# Patient Record
Sex: Male | Born: 1945
Health system: Southern US, Community
[De-identification: ages and names within clinical notes are randomized; demographics above are authoritative.]

## PROBLEM LIST (undated history)

## (undated) DIAGNOSIS — E291 Testicular hypofunction: Secondary | ICD-10-CM

## (undated) DIAGNOSIS — E119 Type 2 diabetes mellitus without complications: Secondary | ICD-10-CM

## (undated) DIAGNOSIS — C61 Malignant neoplasm of prostate: Secondary | ICD-10-CM

## (undated) DIAGNOSIS — I2510 Atherosclerotic heart disease of native coronary artery without angina pectoris: Secondary | ICD-10-CM

## (undated) DIAGNOSIS — N182 Chronic kidney disease, stage 2 (mild): Secondary | ICD-10-CM

## (undated) DIAGNOSIS — K219 Gastro-esophageal reflux disease without esophagitis: Secondary | ICD-10-CM

## (undated) DIAGNOSIS — I440 Atrioventricular block, first degree: Secondary | ICD-10-CM

## (undated) DIAGNOSIS — K5790 Diverticulosis of intestine, part unspecified, without perforation or abscess without bleeding: Secondary | ICD-10-CM

## (undated) DIAGNOSIS — N183 Chronic kidney disease, stage 3 unspecified: Secondary | ICD-10-CM

## (undated) DIAGNOSIS — N189 Chronic kidney disease, unspecified: Secondary | ICD-10-CM

## (undated) DIAGNOSIS — N529 Male erectile dysfunction, unspecified: Secondary | ICD-10-CM

## (undated) DIAGNOSIS — N281 Cyst of kidney, acquired: Secondary | ICD-10-CM

## (undated) DIAGNOSIS — I10 Essential (primary) hypertension: Secondary | ICD-10-CM

## (undated) DIAGNOSIS — E611 Iron deficiency: Secondary | ICD-10-CM

## (undated) DIAGNOSIS — Z9289 Personal history of other medical treatment: Secondary | ICD-10-CM

## (undated) DIAGNOSIS — D4101 Neoplasm of uncertain behavior of right kidney: Secondary | ICD-10-CM

## (undated) DIAGNOSIS — E785 Hyperlipidemia, unspecified: Secondary | ICD-10-CM

## (undated) DIAGNOSIS — J301 Allergic rhinitis due to pollen: Secondary | ICD-10-CM

## (undated) DIAGNOSIS — Z972 Presence of dental prosthetic device (complete) (partial): Secondary | ICD-10-CM

## (undated) DIAGNOSIS — N401 Enlarged prostate with lower urinary tract symptoms: Secondary | ICD-10-CM

## (undated) HISTORY — DX: Essential (primary) hypertension: I10

## (undated) HISTORY — PX: DENTAL SURGERY: SHX609

## (undated) HISTORY — DX: Chronic kidney disease, stage 2 (mild): N18.2

## (undated) HISTORY — DX: Chronic kidney disease, unspecified: N18.9

## (undated) HISTORY — DX: Hyperlipidemia, unspecified: E78.5

## (undated) HISTORY — DX: Gastro-esophageal reflux disease without esophagitis: K21.9

## (undated) HISTORY — DX: Atherosclerotic heart disease of native coronary artery without angina pectoris: I25.10

## (undated) HISTORY — DX: Male erectile dysfunction, unspecified: N52.9

## (undated) HISTORY — DX: Testicular hypofunction: E29.1

## (undated) HISTORY — PX: CIRCUMCISION: SUR203

## (undated) HISTORY — DX: Diverticulosis of intestine, part unspecified, without perforation or abscess without bleeding: K57.90

## (undated) HISTORY — PX: KNEE CARTILAGE SURGERY: SHX688

## (undated) HISTORY — DX: Allergic rhinitis due to pollen: J30.1

## (undated) HISTORY — PX: OTHER SURGICAL HISTORY: SHX169

## (undated) HISTORY — PX: KNEE ARTHROSCOPY: SHX127

---

## 1998-01-09 ENCOUNTER — Encounter: Admission: RE | Admit: 1998-01-09 | Discharge: 1998-04-09 | Payer: Self-pay | Admitting: *Deleted

## 1999-01-09 ENCOUNTER — Encounter: Admission: RE | Admit: 1999-01-09 | Discharge: 1999-01-09 | Payer: Self-pay | Admitting: *Deleted

## 1999-01-09 ENCOUNTER — Encounter: Payer: Self-pay | Admitting: *Deleted

## 2003-11-29 ENCOUNTER — Ambulatory Visit (HOSPITAL_COMMUNITY): Admission: RE | Admit: 2003-11-29 | Discharge: 2003-11-29 | Payer: Self-pay | Admitting: Gastroenterology

## 2004-09-14 ENCOUNTER — Encounter: Admission: RE | Admit: 2004-09-14 | Discharge: 2004-09-14 | Payer: Self-pay | Admitting: Family Medicine

## 2005-10-15 ENCOUNTER — Ambulatory Visit: Payer: Self-pay | Admitting: Family Medicine

## 2006-06-16 ENCOUNTER — Ambulatory Visit: Payer: Self-pay | Admitting: Family Medicine

## 2006-06-19 IMAGING — US US RENAL
1 series · 14 of 25 positions shown · non-contrast
Comparison: None.

CLINICAL DATA: Renal insufficiency. Diabetes.

RENAL/URINARY TRACT ULTRASOUND:
TECHNIQUE: Complete ultrasound examination of the urinary tract was performed
including evaluation of the kidneys, renal collecting systems, and urinary
bladder.

[Series 1: unknown · 0.22mm/px · 14 of 34 slices shown]
[im 1/34]
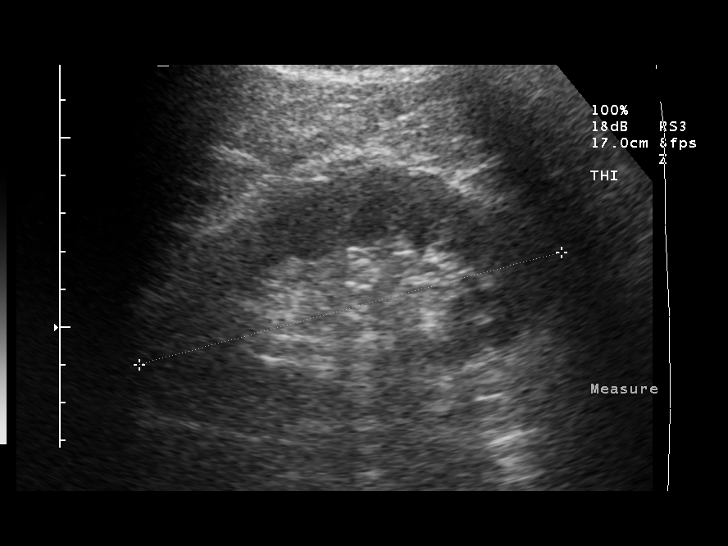
[im 3/34]
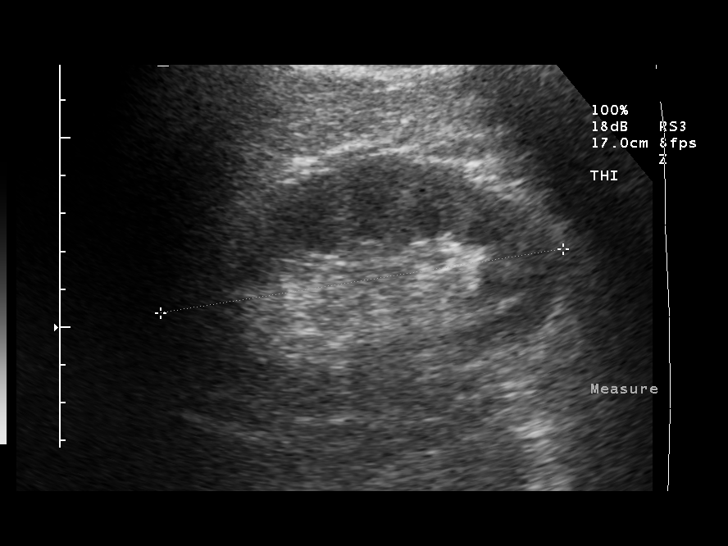
[im 6/34]
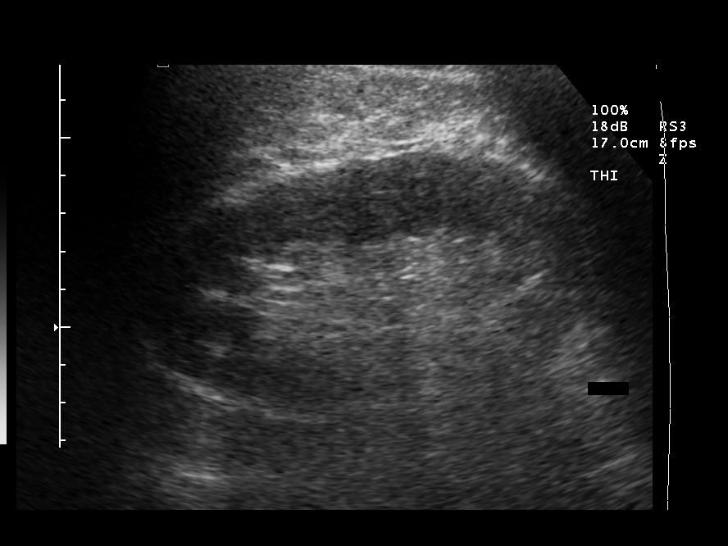
[im 9/34]
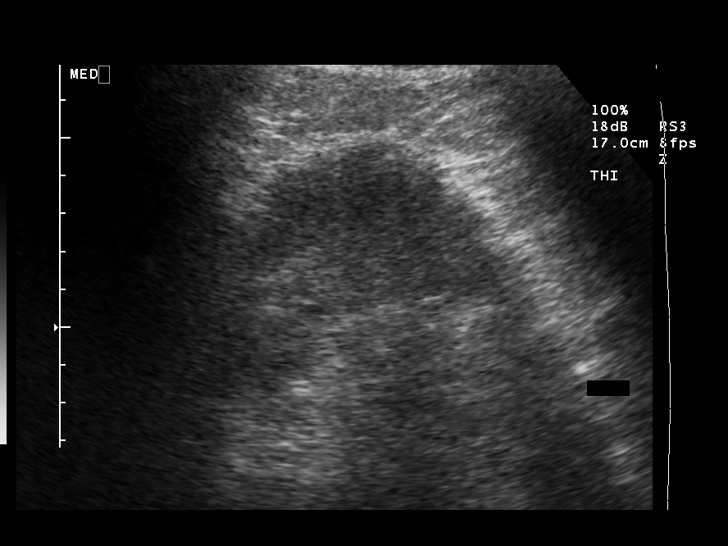
[im 12/34]
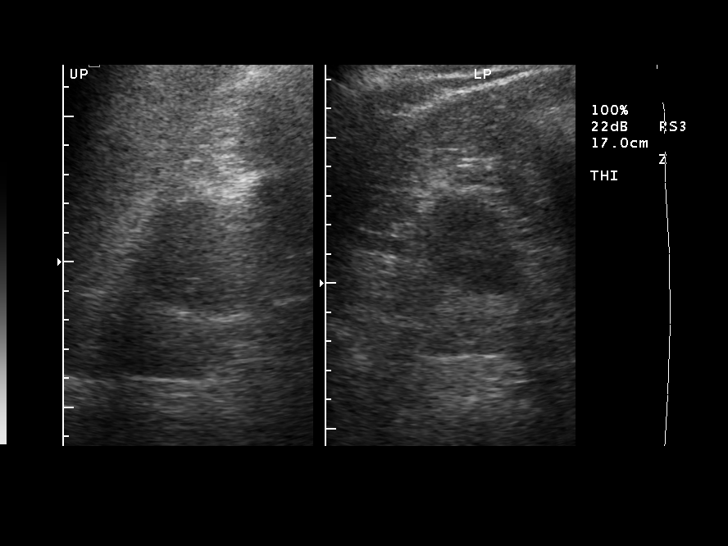
[im 13/34]
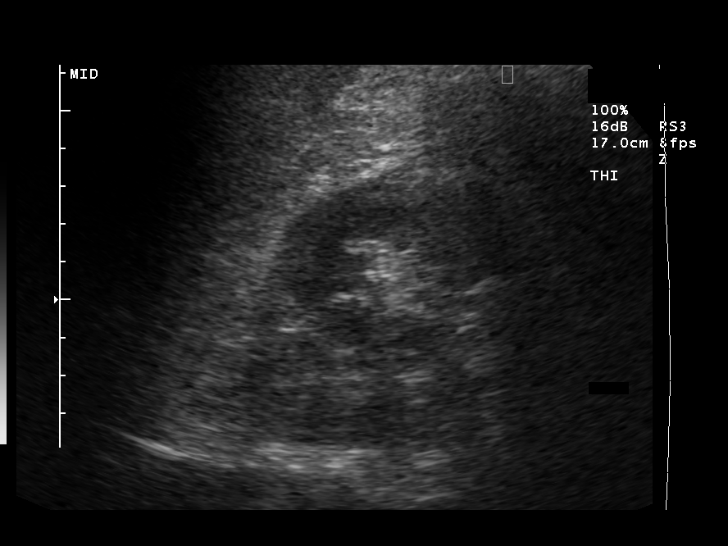
[im 16/34]
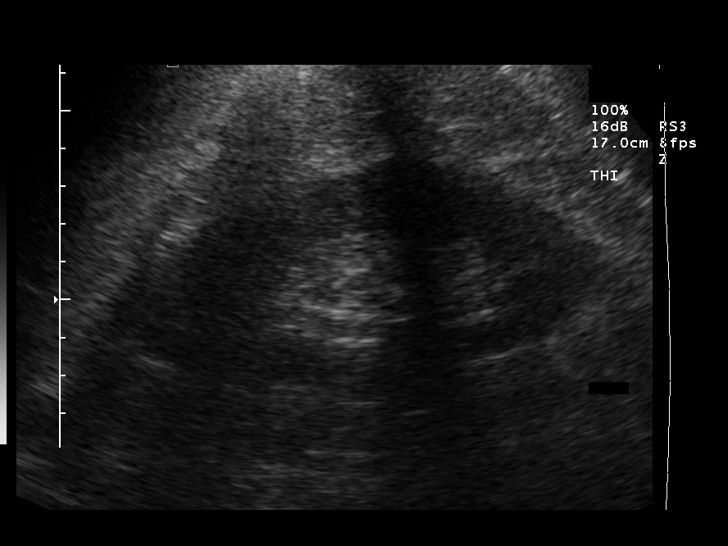
[im 18/34]
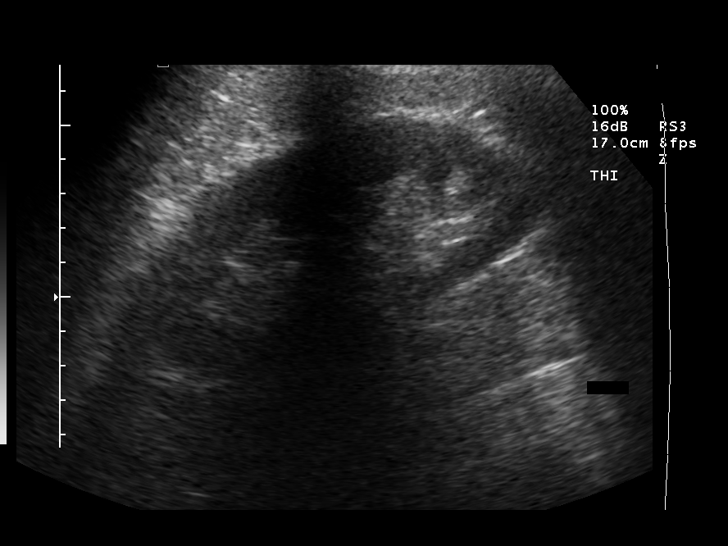
[im 21/34]
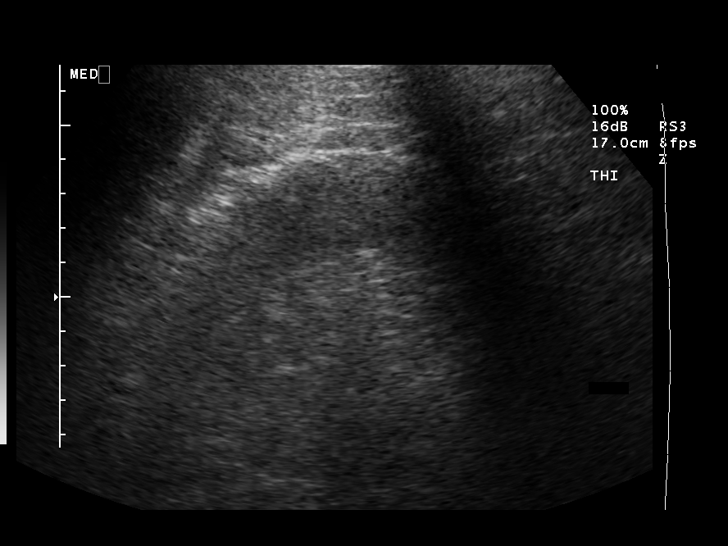
[im 23/34]
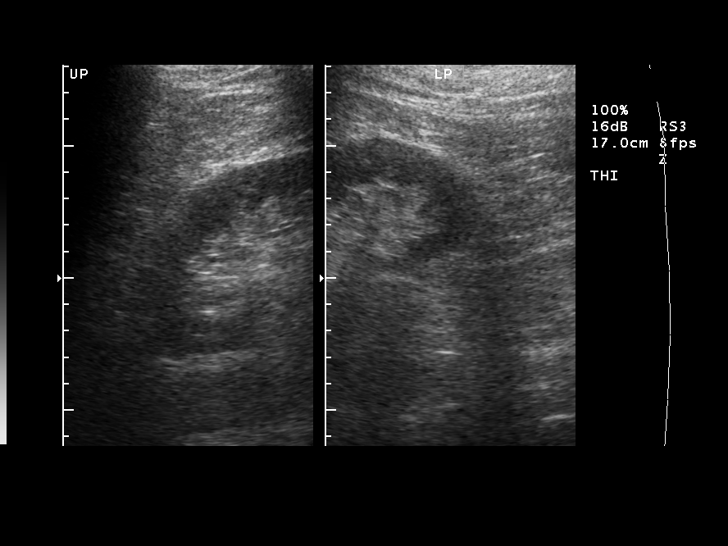
[im 25/34]
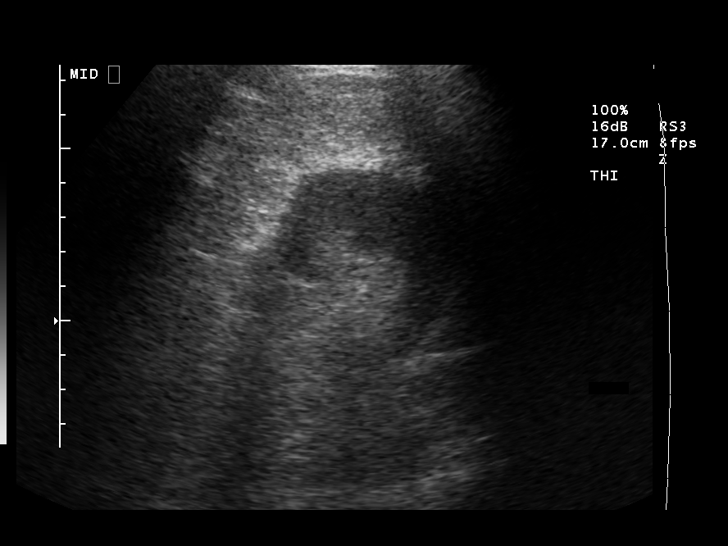
[im 28/34]
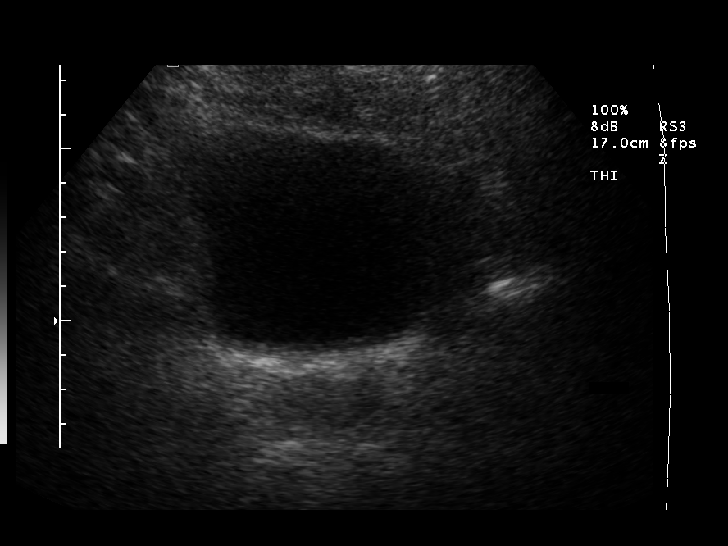
[im 31/34]
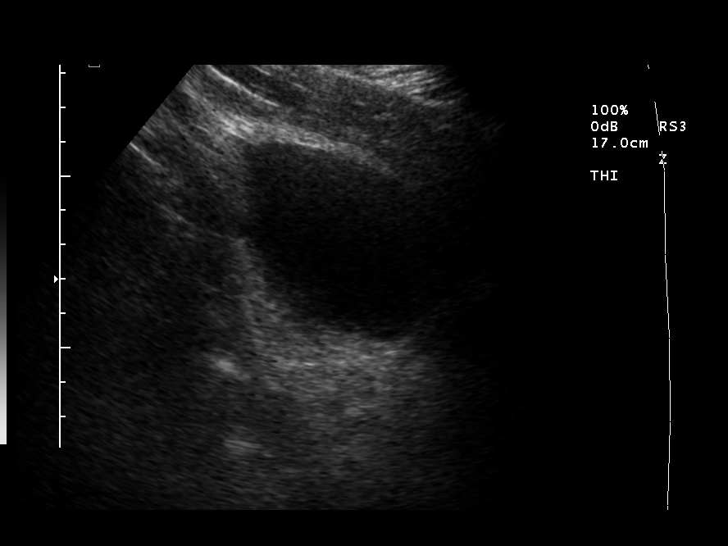
[im 34/34]
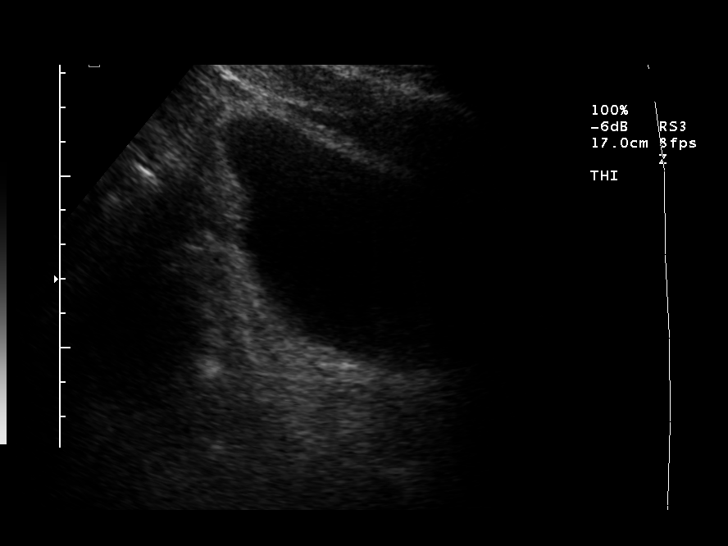

[14 of 25 positions shown; findings below may reference images not displayed]

FINDINGS: The kidneys are normal in size, shape and echotexture. The right
kidney measures 12.3 cm in length and the left kidney measures 12.7 cm in
length. Normal appearing urinary bladder. No masses, calculi or hydronephrosis.
IMPRESSION: Normal examination.

## 2006-07-18 ENCOUNTER — Ambulatory Visit: Payer: Self-pay | Admitting: Family Medicine

## 2006-08-23 ENCOUNTER — Ambulatory Visit: Payer: Self-pay | Admitting: Family Medicine

## 2006-10-13 ENCOUNTER — Ambulatory Visit: Payer: Self-pay | Admitting: Family Medicine

## 2006-12-07 ENCOUNTER — Ambulatory Visit: Payer: Self-pay | Admitting: Family Medicine

## 2007-07-11 ENCOUNTER — Ambulatory Visit: Payer: Self-pay | Admitting: Family Medicine

## 2007-08-10 ENCOUNTER — Ambulatory Visit: Payer: Self-pay | Admitting: Family Medicine

## 2008-02-15 ENCOUNTER — Ambulatory Visit: Payer: Self-pay | Admitting: Family Medicine

## 2008-07-25 ENCOUNTER — Ambulatory Visit: Payer: Self-pay | Admitting: Family Medicine

## 2008-09-26 ENCOUNTER — Ambulatory Visit: Payer: Self-pay | Admitting: Family Medicine

## 2009-01-09 ENCOUNTER — Ambulatory Visit: Payer: Self-pay | Admitting: Family Medicine

## 2009-02-18 ENCOUNTER — Ambulatory Visit: Payer: Self-pay | Admitting: Family Medicine

## 2009-07-30 ENCOUNTER — Ambulatory Visit: Payer: Self-pay | Admitting: Family Medicine

## 2009-08-20 ENCOUNTER — Ambulatory Visit: Payer: Self-pay | Admitting: Physician Assistant

## 2009-09-29 ENCOUNTER — Ambulatory Visit: Payer: Self-pay | Admitting: Family Medicine

## 2010-02-03 ENCOUNTER — Ambulatory Visit: Payer: Self-pay | Admitting: Physician Assistant

## 2010-05-15 LAB — HM COLONOSCOPY

## 2010-06-03 ENCOUNTER — Telehealth: Payer: Self-pay | Admitting: Internal Medicine

## 2010-06-03 NOTE — Telephone Encounter (Signed)
Candise Bowens, Dr Loreta Ave called. States he has chronic cough. Need new consult. Give asap. His home  is [phone]. Cell is [phone]

## 2010-06-04 NOTE — Telephone Encounter (Signed)
Pt set to see MR on 06-17-10 at 4:30pm. Pt aware. Carron Curie, CMA

## 2010-06-16 ENCOUNTER — Encounter: Payer: Self-pay | Admitting: Internal Medicine

## 2010-06-17 ENCOUNTER — Ambulatory Visit (INDEPENDENT_AMBULATORY_CARE_PROVIDER_SITE_OTHER): Payer: BC Managed Care – PPO | Admitting: Internal Medicine

## 2010-06-17 ENCOUNTER — Encounter: Payer: Self-pay | Admitting: Internal Medicine

## 2010-06-17 VITALS — BP 146/72 | HR 58 | Temp 97.7°F | Ht 76.0 in | Wt 268.0 lb

## 2010-06-17 DIAGNOSIS — R058 Other specified cough: Secondary | ICD-10-CM

## 2010-06-17 DIAGNOSIS — T44905A Adverse effect of unspecified drugs primarily affecting the autonomic nervous system, initial encounter: Secondary | ICD-10-CM

## 2010-06-17 DIAGNOSIS — T464X5A Adverse effect of angiotensin-converting-enzyme inhibitors, initial encounter: Secondary | ICD-10-CM

## 2010-06-17 DIAGNOSIS — R059 Cough, unspecified: Secondary | ICD-10-CM

## 2010-06-17 DIAGNOSIS — R05 Cough: Secondary | ICD-10-CM

## 2010-06-17 MED ORDER — NEBIVOLOL HCL 10 MG PO TABS: 10.0000 mg | ORAL_TABLET | Freq: Every day | ORAL | Status: DC

## 2010-06-17 NOTE — Progress Notes (Signed)
Subjective:    Patient ID: James Glover, male    DOB: 17-Sep-1945, 65 y.o.   MRN: [id number]  Cough This is a chronic (new visit to Dr. Marchelle Gearing. Present for 6-9 months) problem. The current episode started more than 1 month ago (Started 6-9 months ago. Insidious onset.  No preceding viral illness but was started on Ramipril just prior to onset of. Went to [facility] in dec 2011 some 2-3 months after cough started. Given medicine for allergies and GERD. Initially helped but later did not.). The problem has been unchanged. The problem occurs every few minutes (occurs every 10 minutes. Happnes at night and can wake him up). The cough is non-productive. Associated symptoms include heartburn, nasal congestion, postnasal drip, rhinorrhea and a sore throat. Pertinent negatives include no chest pain, chills, ear congestion, ear pain, fever, headaches, hemoptysis, rash, shortness of breath, sweats, weight loss or wheezing. Associated symptoms comments: Has runny nose in current allergy season but has not impacted cough which started before current allergy season. Has hx of allergy shots but last dose was several years ago. Also, has post nasal drainage this allergy season. Has GERD which he states started same time as cough. Voice is hoarse but this is baseline per patient. Throat feels TICKLE and ITCH on and off and with GERD he will have superimposed BURN. This prompts him to cough. Chronic gagger ++. The symptoms are aggravated by lying down, pollens and cold air (lying down makes gerd worse that makes cough worse per his own report). Risk factors for lung disease include animal exposure ( cat at home). He has tried prescription cough suppressant (Claritin and Zyrtec helpling partially. PPI helped only partially.  Hydrocodone cough syrup suppressed cough completely 2 weeks ago but past 2 days ran out of it and cough has recurred) for the symptoms. The treatment provided mild (omeprazole, dexilant trials did not help.  But nexium and hydrocodone simultaneously helped but back after hydrocodone stopped . Also feels amongst all PPIs nexium is best) relief. His past medical history is significant for environmental allergies. There is no history of asthma, bronchiectasis, bronchitis, COPD, emphysema or pneumonia. GERD: new diagnosis 6-9 months ago at time of cough onset.  He can feel regurrgitiation of acid down in throat. GERD made worse by fried food, bbq, chocolaes, grape fruit whcih he likes but is cutting down now. Normal endoscopy by Dr Loreta Ave 05/15/2010        Review of Systems  Constitutional: Negative for fever, chills, weight loss, diaphoresis and fatigue.       [Obese, snores per report. Can occasionally fall asleep easily as passenger in car HENT: Positive for sore throat, rhinorrhea and postnasal drip. Negative for ear pain.   Eyes: Negative.   Respiratory: Positive for cough. Negative for hemoptysis, shortness of breath and wheezing.   Cardiovascular: Negative for chest pain.  Gastrointestinal: Positive for heartburn.  Genitourinary: Negative.   Musculoskeletal: Negative.   Skin: Negative.  Negative for rash.  Neurological: Negative.  Negative for headaches.  Hematological: Positive for environmental allergies.  Psychiatric/Behavioral: Negative.        Objective:   Physical Exam  [nursing notereviewed. Constitutional: He is oriented to person, place, and time. He appears well-developed and well-nourished. No distress.  HENT:  Head: Normocephalic and atraumatic.  Right Ear: External ear normal.  Left Ear: External ear normal.  Mouth/Throat: Oropharynx is clear and moist. No oropharyngeal exudate.       Severe gag + (chronic gagger)  Eyes: Conjunctivae  and EOM are normal. Pupils are equal, round, and reactive to light. Right eye exhibits no discharge. Left eye exhibits no discharge. No scleral icterus.  Neck: Normal range of motion. Neck supple. No JVD present. No tracheal deviation present.  No thyromegaly present.  Cardiovascular: Normal rate, regular rhythm and intact distal pulses.  Exam reveals no gallop and no friction rub.   No murmur heard. Pulmonary/Chest: Effort normal and breath sounds normal. No respiratory distress. He has no wheezes. He has no rales. He exhibits no tenderness.  Abdominal: Soft. Bowel sounds are normal. He exhibits no distension and no mass. There is no tenderness. There is no rebound and no guarding.  Musculoskeletal: Normal range of motion. He exhibits no edema and no tenderness.  Lymphadenopathy:    He has no cervical adenopathy.  Neurological: He is alert and oriented to person, place, and time. He has normal reflexes. No cranial nerve deficit. Coordination normal.  Skin: Skin is warm and dry. No rash noted. He is not diaphoretic. No erythema. No pallor.  Psychiatric: He has a normal mood and affect. His behavior is normal. Judgment and thought content normal.          Assessment & Plan:

## 2010-06-17 NOTE — Assessment & Plan Note (Signed)
Your cough is probably combination of ramipril, sinus issues and acid reflux Controlling each one is VERY IMPORTANT in GETTING RID OF your cough Following instructions 100% is very important #FOr sinus  - buy netti pot and use with bottled warm water and salt packet daily (we will show you a picture of it) - stop zyrtec, buy chlorpheniramine 4mg  otc tablet; use 2 at night - do not drive after this. IF it makes you too dry or sleepy cut down to  1 tablet at night - use steroid nasal inhaler 2 squirts each nostril daily (take samples and learn technique) #FOr acid reflux  - continue nexium - stop fish oil - follow DIET SHEET carefully #BP MEDS - stop ramipril - increase bystolic to 10mg  per day (take samples for 6 weeks) #FOLLOWUP - return in 5 weeks - consider cyclical cough protocol at followup if unresolveed

## 2010-06-17 NOTE — Patient Instructions (Signed)
Your cough is probably combination of ramipril, sinus issues and acid reflux Controlling each one is VERY IMPORTANT in GETTING RID OF your cough Following instructions 100% is very important #FOr sinus  - buy netti pot and use with bottled warm water and salt packet daily (we will show you a picture of it) - stop zyrtec, buy chlorpheniramine 4mg  otc tablet; use 2 at night - do not drive after this. IF it makes you too dry or sleepy cut down to  1 tablet at night - use steroid nasal inhaler 2 squirts each nostril daily (take samples and learn technique) #FOr acid reflux  - continue nexium - stop fish oil - follow DIET SHEET carefully #BP MEDS - stop ramipril - increase bystolic to 10mg  per day (take samples for 6 weeks) #FOLLOWUP - return in 5 weeks

## 2010-06-19 ENCOUNTER — Encounter: Payer: Self-pay | Admitting: Internal Medicine

## 2010-07-17 NOTE — Op Note (Signed)
NAME:  James Glover, James Glover NO.:  [id number]   MEDICAL RECORD NO.:  [id number]          PATIENT TYPE:  AMB   LOCATION:  ENDO                         FACILITY:  [facility]   PHYSICIAN:  Anselmo Rod, M.D.  DATE OF BIRTH:  14-Jul-1945   DATE OF PROCEDURE:  11/29/2003  DATE OF DISCHARGE:                                 OPERATIVE REPORT   PROCEDURE:  Screening colonoscopy.   ENDOSCOPIST:  Anselmo Rod, M.D.   INSTRUMENT:  Olympus video colonoscope.   INDICATIONS FOR PROCEDURE:  A 65 year old white male undergoing screening  colonoscopy to rule out colonic polyps, masses, etc.   PRE-PROCEDURE PREPARATION:  Informed consent was procured from the patient.  Patient fasted for 8 hours prior to the procedure and prepped with a bottle  of magnesium citrate and a gallon of GoLYTELY the night prior to the  procedure.  Pre-procedure physical:  Patient had stable vital signs, neck  supple, chest clear to auscultation, S1/S2 regular, abdomen soft with normal  bowel sounds.   DESCRIPTION OF PROCEDURE:  The patient was placed in the left lateral  decubitus position, sedated with 80 mg of Demerol and 8 mg of Versed in  slow, incremental doses.  Once the patient was adequately sedated and  maintained on low flow oxygen, continuous cardiac monitoring; the Olympus  video colonoscope was advanced from the rectum to the cecum with difficulty.  There was a large amount of solid stool in the colon.  Multiple washings  were done.  No masses, polyps, erosions or ulcerations were seen. There were sigmoid  diverticula present.  Retroflexion in the rectum revealed a  small internal hemorrhoid.  No other abnormalities were noted.  Small  lesions could have been missed. The patient tolerated the procedure well  without immediate complications.   IMPRESSION:  1.  Small internal hemorrhoid.  2.  Sigmoid diverticulosis.  3.  Large amount of residual stool in the colon, multiple washings done.  4.  Procedure completed up to the cecum, small lesions could have been      missed.   RECOMMENDATIONS:  1.  Continue a high fiber diet with liberal fluids intake.  2.  Brochures on diverticulosis given to the patient for his education.  3.  Repeat colonoscopy in the next 5 years unless the patient develops      abnormal symptoms in the interim.  4.  Outpatient follow up as the need arises in the future.       JNM/MEDQ  D:  11/29/2003  T:  11/30/2003  Job:  [id number]   cc:   Talmadge Coventry, M.D.  [address]  [place]  [place] [address]  Fax: [phone]   Wyline Copas, P.A.

## 2010-07-23 ENCOUNTER — Ambulatory Visit: Payer: BC Managed Care – PPO | Admitting: Internal Medicine

## 2010-07-30 ENCOUNTER — Encounter: Payer: Self-pay | Admitting: Internal Medicine

## 2010-07-30 ENCOUNTER — Ambulatory Visit (INDEPENDENT_AMBULATORY_CARE_PROVIDER_SITE_OTHER): Payer: BC Managed Care – PPO | Admitting: Internal Medicine

## 2010-07-30 VITALS — BP 120/70 | HR 58 | Temp 98.2°F | Ht 76.0 in | Wt 268.4 lb

## 2010-07-30 DIAGNOSIS — T44905A Adverse effect of unspecified drugs primarily affecting the autonomic nervous system, initial encounter: Secondary | ICD-10-CM

## 2010-07-30 DIAGNOSIS — R05 Cough: Secondary | ICD-10-CM

## 2010-07-30 DIAGNOSIS — T464X5A Adverse effect of angiotensin-converting-enzyme inhibitors, initial encounter: Secondary | ICD-10-CM

## 2010-07-30 DIAGNOSIS — R059 Cough, unspecified: Secondary | ICD-10-CM

## 2010-07-30 DIAGNOSIS — R058 Other specified cough: Secondary | ICD-10-CM

## 2010-07-30 NOTE — Progress Notes (Signed)
  Subjective:    Patient ID: James Glover, male    DOB: 1946-02-06, 65 y.o.   MRN: [id number]  HPI   OV 07/30/2010: Followup for multifactorial cough. Last visit 06/17/2010  He reports complete cessation of cough after stopping aCE inhibitors, adopting sinus and gerd control. I had placed him on sample bystolic but this has been changed to losartan by Dr. Alanda Amass which is fine from cough stand point. For sinuses,  I advised netti pot but he took this only for a week. He is not using chlorpheniramine anymore and he took nasal steroids only for 3 weeks. In any event sinus drainage is completely resolved. STill bothered by some GERD esp during days of dietary indiscretion.He is not using fish oil as advised. Not clear how compliant he is with ppi. Overall, feels well. No new complaints  Review of Systems  Constitutional: Negative for fever and unexpected weight change.  HENT: Negative for ear pain, nosebleeds, congestion, sore throat, rhinorrhea, sneezing, trouble swallowing, dental problem, postnasal drip and sinus pressure.   Eyes: Negative for redness and itching.  Respiratory: Negative for cough, chest tightness, shortness of breath and wheezing.   Cardiovascular: Negative for palpitations and leg swelling.  Gastrointestinal: Negative for nausea and vomiting.  Genitourinary: Negative for dysuria.  Musculoskeletal: Negative for joint swelling.  Skin: Negative for rash.  Neurological: Negative for headaches.  Hematological: Does not bruise/bleed easily.  Psychiatric/Behavioral: Negative for dysphoric mood. The patient is not nervous/anxious.        Objective:   Physical Exam  Nursing note and vitals reviewed. Constitutional: He is oriented to person, place, and time. He appears well-developed and well-nourished. No distress.       obese  HENT:  Head: Normocephalic and atraumatic.  Right Ear: External ear normal.  Left Ear: External ear normal.  Mouth/Throat: Oropharynx is clear  and moist. No oropharyngeal exudate.  Eyes: Conjunctivae and EOM are normal. Pupils are equal, round, and reactive to light. Right eye exhibits no discharge. Left eye exhibits no discharge. No scleral icterus.  Neck: Normal range of motion. Neck supple. No JVD present. No tracheal deviation present. No thyromegaly present.  Cardiovascular: Normal rate, regular rhythm and intact distal pulses.  Exam reveals no gallop and no friction rub.   No murmur heard. Pulmonary/Chest: Effort normal and breath sounds normal. No respiratory distress. He has no wheezes. He has no rales. He exhibits no tenderness.  Abdominal: Soft. Bowel sounds are normal. He exhibits no distension and no mass. There is no tenderness. There is no rebound and no guarding.  Musculoskeletal: Normal range of motion. He exhibits no edema and no tenderness.  Lymphadenopathy:    He has no cervical adenopathy.  Neurological: He is alert and oriented to person, place, and time. He has normal reflexes. No cranial nerve deficit. Coordination normal.  Skin: Skin is warm and dry. No rash noted. He is not diaphoretic. No erythema. No pallor.  Psychiatric: He has a normal mood and affect. His behavior is normal. Judgment and thought content normal.          Assessment & Plan:

## 2010-07-30 NOTE — Patient Instructions (Signed)
Your cough was probably combination of ramipril, sinus issues and acid reflux   #FOr sinus  - when it aggravates use  netti pot, chlorpheniramine 4mg otc tablet; use 2 at night or/and nasal steroid as needed #FOr acid reflux  - continue nexium/dexilant/priolsec class of medications as needed or over the counter ranitidine 300mg po every night - avoid fish oil . If your cardiologist or primary doctor feel you should be on this due to cholesterol make sure you are aware it can make acid reflux worse - follow DIET SHEET carefully  #BP MEDS  - we will put ace inhibitor in allergy list  - take losaratan per Dr. WEintraub  #FOLLOWUP  - as needed   

## 2010-08-02 NOTE — Assessment & Plan Note (Signed)
Your cough was probably combination of ramipril, sinus issues and acid reflux   #FOr sinus  - when it aggravates use  netti pot, chlorpheniramine 4mg  otc tablet; use 2 at night or/and nasal steroid as needed #FOr acid reflux  - continue nexium/dexilant/priolsec class of medications as needed or over the counter ranitidine 300mg  po every night - avoid fish oil . If your cardiologist or primary doctor feel you should be on this due to cholesterol make sure you are aware it can make acid reflux worse - follow DIET SHEET carefully  #BP MEDS  - we will put ace inhibitor in allergy list  - take losaratan per Dr. Alanda Amass  #FOLLOWUP  - as needed

## 2010-08-26 ENCOUNTER — Ambulatory Visit (INDEPENDENT_AMBULATORY_CARE_PROVIDER_SITE_OTHER): Payer: BC Managed Care – PPO | Admitting: Family Medicine

## 2010-08-26 ENCOUNTER — Encounter: Payer: Self-pay | Admitting: Family Medicine

## 2010-08-26 VITALS — BP 158/64 | HR 76 | Ht 73.25 in | Wt 261.0 lb

## 2010-08-26 DIAGNOSIS — Z125 Encounter for screening for malignant neoplasm of prostate: Secondary | ICD-10-CM

## 2010-08-26 DIAGNOSIS — E119 Type 2 diabetes mellitus without complications: Secondary | ICD-10-CM

## 2010-08-26 DIAGNOSIS — E782 Mixed hyperlipidemia: Secondary | ICD-10-CM

## 2010-08-26 DIAGNOSIS — N189 Chronic kidney disease, unspecified: Secondary | ICD-10-CM

## 2010-08-26 DIAGNOSIS — A6000 Herpesviral infection of urogenital system, unspecified: Secondary | ICD-10-CM

## 2010-08-26 DIAGNOSIS — E291 Testicular hypofunction: Secondary | ICD-10-CM

## 2010-08-26 DIAGNOSIS — N529 Male erectile dysfunction, unspecified: Secondary | ICD-10-CM

## 2010-08-26 DIAGNOSIS — B351 Tinea unguium: Secondary | ICD-10-CM

## 2010-08-26 DIAGNOSIS — Z79899 Other long term (current) drug therapy: Secondary | ICD-10-CM

## 2010-08-26 DIAGNOSIS — I10 Essential (primary) hypertension: Secondary | ICD-10-CM

## 2010-08-26 DIAGNOSIS — Z0000 Encounter for general adult medical examination without abnormal findings: Secondary | ICD-10-CM

## 2010-08-26 LAB — POCT URINALYSIS DIPSTICK
Bilirubin, UA: NEGATIVE
Blood, UA: NEGATIVE
Glucose, UA: NEGATIVE
Ketones, UA: NEGATIVE
Leukocytes, UA: NEGATIVE
Nitrite, UA: NEGATIVE
Protein, UA: NEGATIVE
Spec Grav, UA: 1.02
Urobilinogen, UA: NEGATIVE
pH, UA: 5

## 2010-08-26 LAB — TSH: TSH: 1.681 u[IU]/mL (ref 0.350–4.500)

## 2010-08-26 LAB — CBC WITH DIFFERENTIAL/PLATELET
Basophils Absolute: 0 10*3/uL (ref 0.0–0.1)
Basophils Relative: 0 % (ref 0–1)
Eosinophils Absolute: 0.3 10*3/uL (ref 0.0–0.7)
Eosinophils Relative: 4 % (ref 0–5)
HCT: 44.7 % (ref 39.0–52.0)
Hemoglobin: 14.2 g/dL (ref 13.0–17.0)
Lymphocytes Relative: 20 % (ref 12–46)
Lymphs Abs: 1.4 10*3/uL (ref 0.7–4.0)
MCH: 30 pg (ref 26.0–34.0)
MCHC: 31.8 g/dL (ref 30.0–36.0)
MCV: 94.5 fL (ref 78.0–100.0)
Monocytes Absolute: 0.5 10*3/uL (ref 0.1–1.0)
Monocytes Relative: 7 % (ref 3–12)
Neutro Abs: 4.9 10*3/uL (ref 1.7–7.7)
Neutrophils Relative %: 69 % (ref 43–77)
Platelets: 147 10*3/uL — ABNORMAL LOW (ref 150–400)
RBC: 4.73 MIL/uL (ref 4.22–5.81)
RDW: 14.4 % (ref 11.5–15.5)
WBC: 7.1 10*3/uL (ref 4.0–10.5)

## 2010-08-26 LAB — COMPREHENSIVE METABOLIC PANEL
ALT: 16 U/L (ref 0–53)
AST: 15 U/L (ref 0–37)
Albumin: 4.6 g/dL (ref 3.5–5.2)
Alkaline Phosphatase: 39 U/L (ref 39–117)
BUN: 26 mg/dL — ABNORMAL HIGH (ref 6–23)
CO2: 25 mEq/L (ref 19–32)
Calcium: 9.2 mg/dL (ref 8.4–10.5)
Chloride: 103 mEq/L (ref 96–112)
Creat: 1.67 mg/dL — ABNORMAL HIGH (ref 0.50–1.35)
Glucose, Bld: 133 mg/dL — ABNORMAL HIGH (ref 70–99)
Potassium: 4.4 mEq/L (ref 3.5–5.3)
Sodium: 139 mEq/L (ref 135–145)
Total Bilirubin: 0.5 mg/dL (ref 0.3–1.2)
Total Protein: 7.2 g/dL (ref 6.0–8.3)

## 2010-08-26 LAB — LIPID PANEL
Cholesterol: 145 mg/dL (ref 0–200)
HDL: 51 mg/dL (ref >39)
LDL Cholesterol: 79 mg/dL (ref 0–99)
Total CHOL/HDL Ratio: 2.8 Ratio
Triglycerides: 74 mg/dL (ref <150)
VLDL: 15 mg/dL (ref 0–40)

## 2010-08-26 LAB — POCT GLYCOSYLATED HEMOGLOBIN (HGB A1C): Hemoglobin A1C: 6.9

## 2010-08-26 LAB — PSA: PSA: 2.12 ng/mL (ref <=4.00)

## 2010-08-26 LAB — TESTOSTERONE: Testosterone: 387.14 ng/dL (ref 250–890)

## 2010-08-26 MED ORDER — OMEGA-3-ACID ETHYL ESTERS 1 G PO CAPS: 2.0000 g | ORAL_CAPSULE | Freq: Two times a day (BID) | ORAL | Status: DC

## 2010-08-26 MED ORDER — PIOGLITAZONE HCL 45 MG PO TABS: 45.0000 mg | ORAL_TABLET | Freq: Every day | ORAL | Status: DC

## 2010-08-26 MED ORDER — NIACIN ER (ANTIHYPERLIPIDEMIC) 500 MG PO TBCR: 500.0000 mg | EXTENDED_RELEASE_TABLET | Freq: Every day | ORAL | Status: DC

## 2010-08-26 MED ORDER — GLYBURIDE-METFORMIN 2.5-500 MG PO TABS: 2.0000 | ORAL_TABLET | Freq: Two times a day (BID) | ORAL | Status: DC

## 2010-08-26 MED ORDER — TESTOSTERONE 12.5 MG/ACT (1%) TD GEL: 4.0000 | Freq: Every day | TRANSDERMAL | Status: DC

## 2010-08-26 MED ORDER — VARDENAFIL HCL 20 MG PO TABS: 20.0000 mg | ORAL_TABLET | Freq: Every day | ORAL | Status: DC | PRN

## 2010-08-26 MED ORDER — NEBIVOLOL HCL 10 MG PO TABS: 10.0000 mg | ORAL_TABLET | Freq: Every day | ORAL | Status: DC

## 2010-08-26 MED ORDER — VALACYCLOVIR HCL 500 MG PO TABS: 500.0000 mg | ORAL_TABLET | Freq: Two times a day (BID) | ORAL | Status: AC

## 2010-08-26 MED ORDER — LOSARTAN POTASSIUM 50 MG PO TABS: 50.0000 mg | ORAL_TABLET | Freq: Every day | ORAL | Status: DC

## 2010-08-26 MED ORDER — SIMVASTATIN 20 MG PO TABS: 20.0000 mg | ORAL_TABLET | Freq: Every day | ORAL | Status: DC

## 2010-08-26 NOTE — Progress Notes (Signed)
James Glover is a 65 y.o. male who presents for a complete physical.  He has the following concerns:  Med check (follow up on chronic medical problems). Toenail fungus.  Previously treated in past.  Denies ingrowing nails or pain, doesn't like how they look. Check skin on scalp, previously has seen dermatologist for liquid nitrogen treatment and wondering if he needs to follow up with them.    Immunization History  Administered Date(s) Administered  . DTaP 07/25/2008  . Pneumococcal Polysaccharide 11/29/2009  Gets flu shots annually Had Hepatitis B series while working at [employer] Last colonoscopy: 04/2010 (due again 2022), Dr. Loreta Ave Last PSA:  01/2010, 07/2009 (on testosterone) Exercise:  Walks 1 mile twice a week Ophtho: just over a year ago Dentist: regularly  Diabetes follow-up:  Blood sugars aren't being checked.  Denies hypoglycemia.  Denies polydipsia and polyuria.  Last eye exam was over a year ago.  Patient follows a low sugar diet and checks feet regularly without concerns.  Hypertension follow-up:  Blood pressures elsewhere are 130/low 70's (he checked twice a day for a while for Dr. Abel Presto last month).  Denies dizziness, headaches, chest pain.  Denies side effects of medications.  GERD:  Was seeing pulmonologist with cough.  ACEI changed to Losartan, he was put on Nexium, he lowered his Lovaza dose, and also treated for allergies.  Cough has resolved. He is no longer taking Nexium.  Only has occasional heartburn, related to what he has eaten.  Hypogonadism:  He cut back his Androgel dose to just 1 spray daily over the last month because he was running low.  Energy has been fine.  Still has some erectile dysfunction, asking for samples of Levitra.  Has used both 10 and 20mg , and finds the 20mg  more effective.  Genital Herpes--gets outbreaks about once a year.  Has an outbreak that started 2 days ago.  Has used Valtrex in the past.  Requesting prescription.  Chronic  Kidney Disease:  Recent visit to Dr. Abel Presto, Cr 1.4, stable.  Past Medical History  Diagnosis Date  . Diabetes mellitus   . Hyperlipidemia   . Hypertension   . GERD (gastroesophageal reflux disease)   . Hemorrhoids     internal and external  . Diverticulosis   . Hay fever   . Hypogonadism male   . Erectile dysfunction     Past Surgical History  Procedure Date  . Circumcision age 76  . Dental surgery     implants    History   Social History  . Marital Status: Married    Spouse Name: N/A    Number of Children: N/A  . 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 Smoking status: Never Smoker   . Smokeless tobacco: Never Used  . Alcohol Use: Yes     glass of wine once a week  . Drug Use: No  . Sexually Active: Not on file   Other Topics Concern  . Not on file   Social History Narrative  . No narrative on file    Family History  Problem Relation Age of Onset  . Lymphoma Father   . Cancer Father     lymphoma  . Hypertension Father   . Lymphoma Brother   . Cancer Brother     lymphoma  . Lymphoma Paternal Grandfather   . Cancer Paternal Grandfather     lymphoma  .  Dementia Mother   . Diabetes Mother   . Diabetes Sister   . Heart disease Neg Hx   . Cancer Brother     skin cancer    Current outpatient prescriptions:aspirin 81 MG tablet, Take 81 mg by mouth daily.  , Disp: , Rfl: ;  glyBURIDE-metformin (GLUCOVANCE) 2.5-500 MG per tablet, Take 2 tablets by mouth 2 (two) times daily with a meal., Disp: 120 tablet, Rfl: 5;  losartan (COZAAR) 50 MG tablet, Take 1 tablet (50 mg total) by mouth daily., Disp: 30 tablet, Rfl: 5;  nebivolol (BYSTOLIC) 10 MG tablet, Take 1 tablet (10 mg total) by mouth daily., Disp: 30 tablet, Rfl: 5 niacin (NIASPAN) 500 MG CR tablet, Take 1 tablet (500 mg total) by mouth at bedtime., Disp: 30 tablet, Rfl: 5;  omega-3 acid ethyl esters (LOVAZA) 1 G capsule,  Take 2 capsules (2 g total) by mouth 2 (two) times daily., Disp: 120 capsule, Rfl: 5;  pioglitazone (ACTOS) 45 MG tablet, Take 1 tablet (45 mg total) by mouth daily., Disp: 30 tablet, Rfl: 5 simvastatin (ZOCOR) 20 MG tablet, Take 1 tablet (20 mg total) by mouth at bedtime., Disp: 30 tablet, Rfl: 5;  vardenafil (LEVITRA) 20 MG tablet, Take 1 tablet (20 mg total) by mouth daily as needed., Disp: 4 tablet, Rfl: 0;  DISCONTD: glyBURIDE-metformin (GLUCOVANCE) 2.5-500 MG per tablet, Take 2 tablets by mouth 2 (two) times daily with a meal. , Disp: , Rfl: ;  DISCONTD: losartan (COZAAR) 50 MG tablet, Take 50 mg by mouth daily.  , Disp: , Rfl:  DISCONTD: nebivolol (BYSTOLIC) 10 MG tablet, Take 10 mg by mouth daily.  , Disp: , Rfl: ;  DISCONTD: nebivolol (BYSTOLIC) 5 MG tablet, Take 10 mg by mouth daily. , Disp: , Rfl: ;  DISCONTD: niacin (NIASPAN) 500 MG CR tablet, Take 500 mg by mouth at bedtime.  , Disp: , Rfl: ;  DISCONTD: omega-3 acid ethyl esters (LOVAZA) 1 G capsule, Take 2 g by mouth 2 (two) times daily. , Disp: , Rfl:  DISCONTD: pioglitazone (ACTOS) 45 MG tablet, Take 45 mg by mouth daily.  , Disp: , Rfl: ;  DISCONTD: simvastatin (ZOCOR) 20 MG tablet, Take 20 mg by mouth at bedtime.  , Disp: , Rfl: ;  DISCONTD: Testosterone (ANDROGEL PUMP TD), Place 5 g onto the skin daily.  , Disp: , Rfl: ;  DISCONTD: vardenafil (LEVITRA) 20 MG tablet, Take 20 mg by mouth daily as needed.  , Disp: , Rfl:  esomeprazole (NEXIUM) 40 MG capsule, Take 40 mg by mouth daily before breakfast.  , Disp: , Rfl: ;  Testosterone (ANDROGEL PUMP) 1.25 GM/ACT (1%) GEL, Place 4 sprays onto the skin daily., Disp: 150 g, Rfl: 5;  valACYclovir (VALTREX) 500 MG tablet, Take 1 tablet (500 mg total) by mouth 2 (two) times daily. Use for 3 days, as needed for herpes outbreaks, Disp: 24 tablet, Rfl: 0  Allergies  Allergen Reactions  . Ace Inhibitors     cough   ROS: The patient denies anorexia, fever, weight changes, headaches,  vision loss,  decreased hearing, ear pain, hoarseness, chest pain, palpitations, dizziness, syncope, dyspnea on exertion, cough, swelling, nausea, vomiting, diarrhea, constipation, abdominal pain, melena, hematochezia, indigestion/heartburn, hematuria, incontinence, nocturia (just 1-2x), weakened urine stream, dysuria, joint pains, numbness, tingling, weakness, tremor, depression, anxiety, abnormal bleeding/bruising, or enlarged lymph nodes. +ED, genital HSV  PHYSICAL EXAM: BP 158/64  Pulse 76  Ht 6' 1.25" (1.861 m)  Wt 261 lb (118.389 kg)  BMI  34.20 kg/m2  General Appearance:    Alert, cooperative, no distress, appears stated age  Head:    Normocephalic, without obvious abnormality, atraumatic  Eyes:    PERRL, conjunctiva/corneas clear, EOM's intact, fundi    benign  Ears:    Normal TM's and external ear canals  Nose:   Nares normal, mucosa normal, no drainage or sinus   tenderness  Throat:   Lips, mucosa, and tongue normal; teeth and gums normal  Neck:   Supple, no lymphadenopathy;  thyroid:  no   enlargement/tenderness/nodules; no carotid   bruit or JVD  Back:    Spine nontender, no curvature, ROM normal, no CVA     tenderness  Lungs:     Clear to auscultation bilaterally without wheezes, rales or     ronchi; respirations unlabored  Chest Wall:    No tenderness or deformity   Heart:    Regular rate and rhythm, S1 and S2 normal, no murmur, rub   or gallop  Breast Exam:    No chest wall tenderness, masses or gynecomastia  Abdomen:     Soft, non-tender, nondistended, normoactive bowel sounds,    no masses, no hepatosplenomegaly  Genitalia:    Normal male external genitalia.  Some erythema on penile shaft, no ulceration or other lesions.  Penile piercing  Testicles without masses.  No inguinal hernias.  Rectal:    Normal sphincter tone, no masses or tenderness; guaiac negative stool.  Prostate smooth, no nodules, not enlarged.  Extremities:   No clubbing, cyanosis or edema.Some discoloration and mild  thickening of multiple toenails.  Trace edema at ankles.  Pulses:   2+ and symmetric all extremities  Skin:   Skin color, texture, turgor normal. Dry skin on lower extremities.  AK's on forehead and ears  Lymph nodes:   Cervical, supraclavicular, and axillary nodes normal  Neurologic:   CNII-XII intact, normal strength, sensation and gait; reflexes 2+ and symmetric throughout.  Normal monofilament testing both feet          Psych:   Normal mood, affect, hygiene and grooming.    ASSESSMENT/PLAN: 1. Routine general medical examination at a health care facility  Visual acuity screening, POCT urinalysis dipstick  2. Type II or unspecified type diabetes mellitus without mention of complication, not stated as uncontrolled  Comprehensive metabolic panel, TSH, POCT HgB U9W, glyBURIDE-metformin (GLUCOVANCE) 2.5-500 MG per tablet, pioglitazone (ACTOS) 45 MG tablet  3. Mixed hyperlipidemia  Lipid panel, niacin (NIASPAN) 500 MG CR tablet, omega-3 acid ethyl esters (LOVAZA) 1 G capsule, simvastatin (ZOCOR) 20 MG tablet  4. Hypogonadism male  PSA, Testosterone, Testosterone (ANDROGEL PUMP) 1.25 GM/ACT (1%) GEL   on testosterone replacement  5. Encounter for long-term (current) use of other medications  PSA, CBC with Differential, Testosterone  6. Special screening for malignant neoplasm of prostate  PSA  7. CKD (chronic kidney disease)     stage 2/3; monitored by Dr. Arrie Aran  8. Essential hypertension, benign  Comprehensive metabolic panel, losartan (COZAAR) 50 MG tablet, nebivolol (BYSTOLIC) 10 MG tablet  9. Erectile dysfunction  vardenafil (LEVITRA) 20 MG tablet  10. Genital HSV  valACYclovir (VALTREX) 500 MG tablet  11. Onychomycosis     asymptomatic/cosmetic.  Treatment not recommended at this point   Diabetes--borderline control. Encouraged daily exercise and weight loss.  Schedule eye exam HTN--BP elevated here, some white coat component.  BP's better elsewhere.  Continue to periodically check  BP's and f/u if persistently elevated.  Discussed PSA screening (risks/benefits), recommended at  least 30 minutes of aerobic activity at least 5 days/week; proper sunscreen use reviewed; healthy diet and alcohol recommendations (less than or equal to 2 drinks/day) reviewed; regular seatbelt use; changing batteries in smoke detectors. Self-testicular exams. Immunization recommendations discussed--flu shots annually.  Colonoscopy recommendations reviewed--due 2022.

## 2010-08-27 ENCOUNTER — Telehealth: Payer: Self-pay | Admitting: *Deleted

## 2010-08-27 NOTE — Telephone Encounter (Signed)
Spoke with patient, gave him lab results. He stated that he will try to increase his fluid intake especially during these hot summer months. Also faxed copy of labs to [facility] @ [phone] fax.

## 2010-10-08 ENCOUNTER — Other Ambulatory Visit: Payer: Self-pay | Admitting: *Deleted

## 2010-10-08 DIAGNOSIS — E119 Type 2 diabetes mellitus without complications: Secondary | ICD-10-CM

## 2011-03-15 ENCOUNTER — Telehealth: Payer: Self-pay | Admitting: Family Medicine

## 2011-03-15 DIAGNOSIS — E119 Type 2 diabetes mellitus without complications: Secondary | ICD-10-CM

## 2011-03-15 MED ORDER — PIOGLITAZONE HCL 45 MG PO TABS: 45.0000 mg | ORAL_TABLET | Freq: Every day | ORAL | Status: DC

## 2011-03-15 NOTE — Telephone Encounter (Signed)
Sent refill to [facility] for [employer] 45mg  #30 once daily as pt has med ck appt with Dr.Knapp 03/18/11.

## 2011-03-15 NOTE — Telephone Encounter (Deleted)
Sent refill for actos 45mg  #120 to [facility], [place] as pt is scheduled for a med check with Dr.Knapp 03/18/11.

## 2011-03-18 ENCOUNTER — Ambulatory Visit (INDEPENDENT_AMBULATORY_CARE_PROVIDER_SITE_OTHER): Payer: BC Managed Care – PPO | Admitting: Family Medicine

## 2011-03-18 ENCOUNTER — Encounter: Payer: Self-pay | Admitting: Family Medicine

## 2011-03-18 VITALS — BP 138/78 | HR 64 | Ht 75.5 in | Wt 268.0 lb

## 2011-03-18 DIAGNOSIS — Z79899 Other long term (current) drug therapy: Secondary | ICD-10-CM

## 2011-03-18 DIAGNOSIS — E782 Mixed hyperlipidemia: Secondary | ICD-10-CM

## 2011-03-18 DIAGNOSIS — E291 Testicular hypofunction: Secondary | ICD-10-CM

## 2011-03-18 DIAGNOSIS — N529 Male erectile dysfunction, unspecified: Secondary | ICD-10-CM

## 2011-03-18 DIAGNOSIS — I10 Essential (primary) hypertension: Secondary | ICD-10-CM

## 2011-03-18 DIAGNOSIS — E119 Type 2 diabetes mellitus without complications: Secondary | ICD-10-CM

## 2011-03-18 LAB — HEPATIC FUNCTION PANEL
ALT: 17 U/L (ref 0–53)
AST: 15 U/L (ref 0–37)
Albumin: 4.5 g/dL (ref 3.5–5.2)
Alkaline Phosphatase: 31 U/L — ABNORMAL LOW (ref 39–117)
Bilirubin, Direct: 0.1 mg/dL (ref 0.0–0.3)
Indirect Bilirubin: 0.5 mg/dL (ref 0.0–0.9)
Total Bilirubin: 0.6 mg/dL (ref 0.3–1.2)
Total Protein: 6.9 g/dL (ref 6.0–8.3)

## 2011-03-18 LAB — LIPID PANEL
Cholesterol: 153 mg/dL (ref 0–200)
HDL: 49 mg/dL (ref >39)
LDL Cholesterol: 91 mg/dL (ref 0–99)
Total CHOL/HDL Ratio: 3.1 Ratio
Triglycerides: 66 mg/dL (ref <150)
VLDL: 13 mg/dL (ref 0–40)

## 2011-03-18 LAB — POCT GLYCOSYLATED HEMOGLOBIN (HGB A1C): Hemoglobin A1C: 6.5

## 2011-03-18 MED ORDER — VARDENAFIL HCL 20 MG PO TABS: 20.0000 mg | ORAL_TABLET | Freq: Every day | ORAL | Status: DC | PRN

## 2011-03-18 MED ORDER — LOSARTAN POTASSIUM 50 MG PO TABS: 50.0000 mg | ORAL_TABLET | Freq: Every day | ORAL | Status: DC

## 2011-03-18 MED ORDER — NEBIVOLOL HCL 10 MG PO TABS
10.0000 mg | ORAL_TABLET | Freq: Every day | ORAL | Status: DC
Start: 2011-03-18 — End: 2011-07-29

## 2011-03-18 MED ORDER — TESTOSTERONE 12.5 MG/ACT (1%) TD GEL: 4.0000 | Freq: Every day | TRANSDERMAL | Status: DC

## 2011-03-18 MED ORDER — GLYBURIDE-METFORMIN 2.5-500 MG PO TABS: 2.0000 | ORAL_TABLET | Freq: Two times a day (BID) | ORAL | Status: DC

## 2011-03-18 MED ORDER — NIACIN ER (ANTIHYPERLIPIDEMIC) 500 MG PO TBCR: 500.0000 mg | EXTENDED_RELEASE_TABLET | Freq: Every day | ORAL | Status: DC

## 2011-03-18 MED ORDER — SIMVASTATIN 20 MG PO TABS
20.0000 mg | ORAL_TABLET | Freq: Every day | ORAL | Status: DC
Start: 2011-03-18 — End: 2011-10-04

## 2011-03-18 NOTE — Patient Instructions (Signed)
Continue to check your BP at home, check your feet regularly. Continue all current medications, and call if/when you need refills

## 2011-03-18 NOTE — Progress Notes (Signed)
Patient presents for fasting med check, 6 month f/u on chronic medical problems. He has no specific concerns today, other than needing med refills.  Diabetes follow-up: Blood sugars aren't being checked. Denies hypoglycemia. Denies polydipsia and polyuria. Last eye exam was October 2012. Patient follows a low sugar diet and checks feet regularly without concerns.   Hypertension follow-up: Blood pressures are running 115-138/58-71, pulse 47-69. He had HCTZ 12.5 mg added by Dr. Abel Presto in December for elevated BP's and edema. Denies dizziness, headaches, chest pain. Denies side effects of medications.   GERD: Was seeing pulmonologist with cough. ACEI changed to Losartan, he was put on Nexium, he lowered his Lovaza dose, and also treated for allergies. Cough has resolved. He is no longer taking Nexium. Denies any further problems with cough or reflux symptoms.  Hypogonadism: Using 2 sprays daily of Androgel.  Energy has been fine. Still has some erectile dysfunction, asking for rx for Levitra 20mg  (10 mg isn't as effective)  Chronic Kidney Disease: Followed by Dr. Abel Presto, last seen in December, HCTZ added, Cr was up to 2.  He has follow scheduled with him in February.  Past Medical History  Diagnosis Date  . Diabetes mellitus   . Hyperlipidemia   . Hypertension   . GERD (gastroesophageal reflux disease)   . Hemorrhoids     internal and external  . Diverticulosis   . Hay fever   . Hypogonadism male   . Erectile dysfunction     Past Surgical History  Procedure Date  . Circumcision age 42  . Dental surgery     implants    History   Social History  . Marital Status: Married    Spouse Name: N/A    Number of Children: N/A  . 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 Smoking status: Never Smoker   . Smokeless tobacco: Never Used  . Alcohol Use: Yes     glass of wine once a week  . Drug Use: No    . Sexually Active: Not on file   Other Topics Concern  . Not on file   Social History Narrative  . No narrative on file    Family History  Problem Relation Age of Onset  . Lymphoma Father   . Cancer Father     lymphoma  . Hypertension Father   . Lymphoma Brother   . Cancer Brother     lymphoma  . Lymphoma Paternal Grandfather   . Cancer Paternal Grandfather     lymphoma  . Dementia Mother   . Diabetes Mother   . Diabetes Sister   . Heart disease Neg Hx   . Cancer Brother     skin cancer   Current Outpatient Prescriptions on File Prior to Visit  Medication Sig Dispense Refill  . aspirin 81 MG tablet Take 81 mg by mouth daily.        Marland Kitchen glyBURIDE-metformin (GLUCOVANCE) 2.5-500 MG per tablet Take 2 tablets by mouth 2 (two) times daily with a meal.  120 tablet  5  . losartan (COZAAR) 50 MG tablet Take 1 tablet (50 mg total) by mouth daily.  30 tablet  5  . nebivolol (BYSTOLIC) 10 MG tablet Take 1 tablet (10 mg total) by mouth daily.  30 tablet  5  . niacin (NIASPAN) 500 MG CR tablet Take 1 tablet (500 mg total) by mouth at bedtime.  30 tablet  5  . omega-3 acid ethyl esters (LOVAZA) 1 G capsule Take 2 capsules (2 g total) by mouth 2 (two) times daily.  120 capsule  5  . pioglitazone (ACTOS) 45 MG tablet Take 1 tablet (45 mg total) by mouth daily.  30 tablet  0  . simvastatin (ZOCOR) 20 MG tablet Take 1 tablet (20 mg total) by mouth at bedtime.  30 tablet  5  . Testosterone (ANDROGEL PUMP) 1.25 GM/ACT (1%) GEL Place 4 sprays onto the skin daily.  150 g  5  . vardenafil (LEVITRA) 20 MG tablet Take 1 tablet (20 mg total) by mouth daily as needed.  4 tablet  0   Allergies  Allergen Reactions  . Ace Inhibitors     cough   ROS:  Denies fevers, URI symptoms, chest pain, headaches, dizziness, GI complaints, urinary complaints (up 1-2x/night), +ED.  Denies skin rashes/concerns.  Occasional jock itch, treated with OTC meds  PHYSICAL EXAM: Well developed, pleasant, talkative male  in no distress BP 162/88  Pulse 64  Ht 6' 3.5" (1.918 m)  Wt 268 lb (121.564 kg)  BMI 33.06 kg/m2 Neck: no lymphadenopathy, thyromegaly or carotid bruit Heart: bradycardic, no murmur, rub or gallop Lungs: clear bilaterally Abdomen: soft, nontender, no organomegaly or mass Extremities: 2+ distal pulses. Trace pretibeal edema, normal monofilament exam.  +onychomycosis Psych: normal mood, affect, hygiene, grooming Skin: no rash  ASSESSMENT/PLAN: 1. Type II or unspecified type diabetes mellitus without mention of complication, not stated as uncontrolled  POCT HgB A1C, glyBURIDE-metformin (GLUCOVANCE) 2.5-500 MG per tablet  2. Erectile dysfunction  vardenafil (LEVITRA) 20 MG tablet  3. Mixed hyperlipidemia  Lipid panel, Hepatic function panel, niacin (NIASPAN) 500 MG CR tablet, simvastatin (ZOCOR) 20 MG tablet  4. Hypogonadism male  PSA, Testosterone, free, total, Testosterone (ANDROGEL PUMP) 1.25 GM/ACT (1%) GEL   on testosterone replacement  5. Essential hypertension, benign  losartan (COZAAR) 50 MG tablet, nebivolol (BYSTOLIC) 10 MG tablet  6. Encounter for long-term (current) use of other medications  PSA, Testosterone, free, total   F/u 6 months for CPE and med check

## 2011-03-19 ENCOUNTER — Encounter: Payer: Self-pay | Admitting: Family Medicine

## 2011-03-19 LAB — TESTOSTERONE, FREE, TOTAL, SHBG
Sex Hormone Binding: 40 nmol/L (ref 13–71)
Testosterone, Free: 95.2 pg/mL (ref 47.0–244.0)
Testosterone-% Free: 1.9 % (ref 1.6–2.9)
Testosterone: 509.93 ng/dL (ref 250–890)

## 2011-03-19 LAB — PSA: PSA: 2.32 ng/mL (ref <=4.00)

## 2011-04-13 ENCOUNTER — Telehealth: Payer: Self-pay | Admitting: Family Medicine

## 2011-04-14 ENCOUNTER — Other Ambulatory Visit: Payer: Self-pay | Admitting: *Deleted

## 2011-04-14 DIAGNOSIS — E119 Type 2 diabetes mellitus without complications: Secondary | ICD-10-CM

## 2011-04-14 MED ORDER — PIOGLITAZONE HCL 45 MG PO TABS: 45.0000 mg | ORAL_TABLET | Freq: Every day | ORAL | Status: DC

## 2011-04-19 NOTE — Telephone Encounter (Signed)
done

## 2011-06-22 ENCOUNTER — Encounter: Payer: Self-pay | Admitting: Nephrology

## 2011-07-02 ENCOUNTER — Encounter: Payer: Self-pay | Admitting: Dermatology

## 2011-07-29 ENCOUNTER — Ambulatory Visit (INDEPENDENT_AMBULATORY_CARE_PROVIDER_SITE_OTHER): Payer: BC Managed Care – PPO | Admitting: Family Medicine

## 2011-07-29 ENCOUNTER — Encounter: Payer: Self-pay | Admitting: Family Medicine

## 2011-07-29 ENCOUNTER — Telehealth: Payer: Self-pay | Admitting: Family Medicine

## 2011-07-29 VITALS — BP 148/80 | HR 60 | Ht 73.25 in | Wt 265.0 lb

## 2011-07-29 DIAGNOSIS — E785 Hyperlipidemia, unspecified: Secondary | ICD-10-CM | POA: Insufficient documentation

## 2011-07-29 DIAGNOSIS — N189 Chronic kidney disease, unspecified: Secondary | ICD-10-CM

## 2011-07-29 DIAGNOSIS — I10 Essential (primary) hypertension: Secondary | ICD-10-CM | POA: Insufficient documentation

## 2011-07-29 DIAGNOSIS — E291 Testicular hypofunction: Secondary | ICD-10-CM | POA: Insufficient documentation

## 2011-07-29 DIAGNOSIS — E119 Type 2 diabetes mellitus without complications: Secondary | ICD-10-CM | POA: Insufficient documentation

## 2011-07-29 LAB — POCT GLYCOSYLATED HEMOGLOBIN (HGB A1C): Hemoglobin A1C: 7.1

## 2011-07-29 MED ORDER — SAXAGLIPTIN HCL 2.5 MG PO TABS: 2.5000 mg | ORAL_TABLET | Freq: Every day | ORAL | Status: DC

## 2011-07-29 MED ORDER — GLUCOSE BLOOD VI STRP: ORAL_STRIP | Status: DC

## 2011-07-29 MED ORDER — FREESTYLE LANCETS MISC: Status: DC

## 2011-07-29 NOTE — Progress Notes (Signed)
Asked to come in to discuss his diabetes medications.  His last Cr from urologist was 1.79, and he is on glyburide/metformin.  In 2011-12 his Cr was 1.4-1.5 range, but over the last year has remained higher.  Denies hypoglycemia.  He doesn't check his blood sugars at home--no longer has a machine, and doesn't like poking himself. Denies polydipsia, polyuria.  BP's 130/60's-70 at home.  Denies headaches, dizziness, chest pain.  Past Medical History  Diagnosis Date  . Diabetes mellitus   . Hyperlipidemia   . Hypertension   . GERD (gastroesophageal reflux disease)   . Hemorrhoids     internal and external  . Diverticulosis   . Hay fever   . Hypogonadism male   . Erectile dysfunction    Past Surgical History  Procedure Date  . Circumcision age 21  . Dental surgery     implants   Current Outpatient Prescriptions on File Prior to Visit  Medication Sig Dispense Refill  . aspirin 81 MG tablet Take 81 mg by mouth daily.        Marland Kitchen losartan (COZAAR) 50 MG tablet Take 1 tablet (50 mg total) by mouth daily.  30 tablet  5  . niacin (NIASPAN) 500 MG CR tablet Take 1 tablet (500 mg total) by mouth at bedtime.  30 tablet  5  . omega-3 acid ethyl esters (LOVAZA) 1 G capsule Take 2 capsules (2 g total) by mouth 2 (two) times daily.  120 capsule  5  . pioglitazone (ACTOS) 45 MG tablet Take 1 tablet (45 mg total) by mouth daily.  30 tablet  4  . simvastatin (ZOCOR) 20 MG tablet Take 1 tablet (20 mg total) by mouth at bedtime.  30 tablet  5  . Testosterone (ANDROGEL PUMP) 1.25 GM/ACT (1%) GEL Place 4 sprays onto the skin daily.  150 g  5  . vardenafil (LEVITRA) 20 MG tablet Take 1 tablet (20 mg total) by mouth daily as needed for erectile dysfunction.  15 tablet  10  . nebivolol (BYSTOLIC) 10 MG tablet Take 1 tablet (10 mg total) by mouth daily.  30 tablet  1  . saxagliptin HCl (ONGLYZA) 2.5 MG TABS tablet Take 1 tablet (2.5 mg total) by mouth daily.  42 tablet  0  . DISCONTD: nebivolol (BYSTOLIC) 10 MG  tablet Take 1 tablet (10 mg total) by mouth daily.  30 tablet  5   Allergies  Allergen Reactions  . Ace Inhibitors     cough   ROS:  Denies fevers, URI symptoms, chest pain, shortness of breath, skin rashes, urinary complaints or other problems  PHYSICAL EXAM: BP 148/80  Pulse 60  Ht 6' 1.25" (1.861 m)  Wt 265 lb (120.203 kg)  BMI 34.72 kg/m2 142/70 on repeat Well developed, pleasant overweight male in no distress Neck: no lymphadenopathy, thyromegaly or carotid bruit  Heart: bradycardic, no murmur, rub or gallop  Lungs: clear bilaterally  Abdomen: soft, nontender, no organomegaly or mass. +abdominal obesity Extremities: 2+ distal pulses. Trace pretibeal edema Psych: normal mood, affect, hygiene, grooming  Skin: no rash  ASSESSMENT/PLAN: 1. Type II or unspecified type diabetes mellitus without mention of complication, not stated as uncontrolled  HgB A1c, Lancets (FREESTYLE) lancets, glucose blood (FREESTYLE LITE) test strip  2. Essential hypertension, benign    3. Chronic kidney disease (CKD)     Stop glucovance (risk for hypoglycemia due to CKD, and increased risk for lactic acidosis on metformin) Change to either tradjenta/onglyza/januvia--depending on cost. Pt to check  with pharmacy and let us know.  We can provide samples to start if available. Reviewed risks/side effects of current and new meds. Continue Actos. Start checking sugars--given monitor, and he and his wife were shown how to use.  Check BID, write down, and bring list to visit in 1 month. Now that he is retired, encouraged him to exercise daily and focus on eating right and losing weight.  Samples of 2.5mg  Onglyza given x 6 weeks after pt checked with pharmacy (see phone message)  HTN--well controlled.  Numbers from home reviewed--mostly 130/60's  F/u 1 month to review blood sugars  CPE due soon also--will be Welcome to Medicare (I believe)  Labs will be due again in July

## 2011-07-29 NOTE — Patient Instructions (Signed)
See if your insurance has a preferred drug out of these 3: Tradjenta 5mg , Onlyza 2.5mg  or Januvia 50mg  (all taken once daily).  If there is a preferred/cheaper drug, call us and tell us what it is.  If we have samples, we can give you samples for a month to see how you tolerate it.  Continue the Actos 45 mg. Stop the glyburide/metformin only once you have the new medication to start  Please start checking sugars--daily if you can.  Write them down on a piece of paper, along with any comments about abnormals.

## 2011-07-29 NOTE — Telephone Encounter (Signed)
Spoke with patient and he will pick up samples tomorrow.

## 2011-07-29 NOTE — Telephone Encounter (Signed)
Advise pt that 2.5mg  Onglyza samples x 6 weeks are ready to pick up.  If tolerating, we will eventually send rx for med

## 2011-08-27 ENCOUNTER — Encounter: Payer: Self-pay | Admitting: Internal Medicine

## 2011-09-08 ENCOUNTER — Ambulatory Visit (INDEPENDENT_AMBULATORY_CARE_PROVIDER_SITE_OTHER): Payer: BC Managed Care – PPO | Admitting: Family Medicine

## 2011-09-08 ENCOUNTER — Encounter: Payer: Self-pay | Admitting: Family Medicine

## 2011-09-08 ENCOUNTER — Other Ambulatory Visit: Payer: Self-pay | Admitting: *Deleted

## 2011-09-08 VITALS — BP 130/80 | HR 64 | Ht 75.0 in | Wt 258.0 lb

## 2011-09-08 DIAGNOSIS — E119 Type 2 diabetes mellitus without complications: Secondary | ICD-10-CM

## 2011-09-08 DIAGNOSIS — N189 Chronic kidney disease, unspecified: Secondary | ICD-10-CM

## 2011-09-08 MED ORDER — GLYBURIDE 5 MG PO TABS: 5.0000 mg | ORAL_TABLET | Freq: Two times a day (BID) | ORAL | Status: DC

## 2011-09-08 NOTE — Patient Instructions (Signed)
Restart Onglyza Stop the glucovance (glyburide/metformin combination pill that you have been taking) Start glyburide 5mg  BID--start at 1/2 tablet twice daily.  If sugars remain over 160, then can increase to full tablet.  Start this new regimen upon return from trip to [place]

## 2011-09-08 NOTE — Progress Notes (Signed)
Chief Complaint  Patient presents with  . Diabetes    1 month follow up on DM. Patient stopped taking Onglyza because blood sugars were running in the 200's. Went back to glyburide.   HPI: Changed from glucovance to onglyza 6 weeks ago (and continued the Actos) due to his CKD and risk for hypoglycemia, lactic acidosis.  He has been checking his sugars regularly since then, cut back on breads/sweets in diet.  Sugars gradually crept up, into the 200's, and after consistently being 200-280 for a week, he stopped the onglyza and went back to glucovance (4 days ago).  Sugars have been 190's (improved) in the last few days.    He brings in list of BP's from home, 110-125/60's, pulse 51-70.  Past Medical History  Diagnosis Date  . Diabetes mellitus   . Hyperlipidemia   . Hypertension   . GERD (gastroesophageal reflux disease)   . Hemorrhoids     internal and external  . Diverticulosis   . Hay fever   . Hypogonadism male   . Erectile dysfunction    Past Surgical History  Procedure Date  . Circumcision age 56  . Dental surgery     implants   History   Social History  . Marital Status: Married    Spouse Name: N/A    Number of Children: N/A  . 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 Smoking status: Never Smoker   . Smokeless tobacco: Never Used  . Alcohol Use: Yes     glass of wine once a week  . Drug Use: No  . Sexually Active: Not on file   Other Topics Concern  . Not on file   Social History Narrative   Retired 06/2011   See med list Allergies  Allergen Reactions  . Ace Inhibitors     cough   ROS:  Denies hypoglycemia, headaches, dizziness, chest pain, edema, skin lesions/rashes, GI complaints or other concerns.  PHYSICAL EXAM: BP 130/80  Pulse 64  Ht 6\' 3"  (1.905 m)  Wt 258 lb (117.028 kg)  BMI 32.25 kg/m2 Well developed, pleasant, obese elderly male in no  distress  ASSESSMENT/PLAN: 1. Chronic kidney disease (CKD)    2. Type II or unspecified type diabetes mellitus without mention of complication, not stated as uncontrolled  DISCONTINUED: glyBURIDE (DIABETA) 5 MG tablet    Restart Onglyza (at dose of 2.5 given renal function) Stop the glucovance (due to risks as previously discussed) Restart just the glyburide 5mg  BID--start at 1/2 tablet twice daily.  If sugars remain >160, then can increase to full tablet. This change eliminates the metformin (given his renal function and increased risk for lactic acidosis).  It still has the risk for hypoglycemia.  Advised not to skip meals, and call if sugars running low.  Start this new regimen upon return from trip to [place].  F/u in 1-2 months, and schedule CPE

## 2011-09-20 ENCOUNTER — Other Ambulatory Visit: Payer: Self-pay

## 2011-09-20 ENCOUNTER — Telehealth: Payer: Self-pay | Admitting: Internal Medicine

## 2011-09-20 DIAGNOSIS — E119 Type 2 diabetes mellitus without complications: Secondary | ICD-10-CM

## 2011-09-20 MED ORDER — PIOGLITAZONE HCL 45 MG PO TABS: 45.0000 mg | ORAL_TABLET | Freq: Every day | ORAL | Status: DC

## 2011-09-20 NOTE — Telephone Encounter (Signed)
done

## 2011-09-28 ENCOUNTER — Telehealth: Payer: Self-pay | Admitting: Family Medicine

## 2011-09-28 DIAGNOSIS — E119 Type 2 diabetes mellitus without complications: Secondary | ICD-10-CM

## 2011-09-28 MED ORDER — SAXAGLIPTIN HCL 2.5 MG PO TABS: 2.5000 mg | ORAL_TABLET | Freq: Every day | ORAL | Status: DC

## 2011-09-28 NOTE — Telephone Encounter (Signed)
done

## 2011-10-04 ENCOUNTER — Telehealth: Payer: Self-pay | Admitting: Internal Medicine

## 2011-10-04 DIAGNOSIS — E782 Mixed hyperlipidemia: Secondary | ICD-10-CM

## 2011-10-04 MED ORDER — SIMVASTATIN 20 MG PO TABS: 20.0000 mg | ORAL_TABLET | Freq: Every day | ORAL | Status: DC

## 2011-10-04 NOTE — Telephone Encounter (Signed)
Done

## 2011-10-25 ENCOUNTER — Ambulatory Visit (INDEPENDENT_AMBULATORY_CARE_PROVIDER_SITE_OTHER): Payer: BC Managed Care – PPO | Admitting: Family Medicine

## 2011-10-25 ENCOUNTER — Encounter: Payer: Self-pay | Admitting: Family Medicine

## 2011-10-25 VITALS — BP 140/80 | HR 68 | Ht 75.0 in | Wt 256.0 lb

## 2011-10-25 DIAGNOSIS — I10 Essential (primary) hypertension: Secondary | ICD-10-CM

## 2011-10-25 DIAGNOSIS — E119 Type 2 diabetes mellitus without complications: Secondary | ICD-10-CM

## 2011-10-25 NOTE — Patient Instructions (Signed)
Try and exercise at least 30 minutes every day. Continue with your weight loss. Check sugars in the mornings, but also check in the evenings at least 2-3 times/week (can be in place of morning check).  Keep it in columns like shown by Dr. Lynelle Doctor, including comments for why sugars may be up or down compared to the norm.  We will check A1c again in October.  If sugars get higher, let us know, otherwise we will address in October.  If A1c is well over 7, we will either need to further increase the glyburide dose (which I don't like to do in older folks with kidney disease), versus considering trying Invokana, which is a new diabetes class of drug.

## 2011-10-25 NOTE — Progress Notes (Signed)
Chief Complaint  Patient presents with  . Diabetes    1 month follow up.   HPI:  Patient presents for follow-up on diabetes.  At last visit he was started on glyburide 5 mg twice daily, and restarted the Onglyza 2.5, continued the Actos 45mg .  He had been getting very high sugars on the onglyza without the glyburide.  We had stopped the metformin due to potential risks given his kidney function.  He started the glyburide at 1/2 tablet twice daily, as instructed, but needed to increase to full tablet due to elevated blood sugars.  He didn't change over to this regimen (stayed on the glucovance without onglyza) until after he returned from his vacation, so only on this regimen for about 6 weeks.   Fasting sugars have been 147-181, mostly 160's.  Only checked later in the day twice, once was 185, other was 79--this was after a 2 mile walk.  He is down 12 pounds since January, 2 pounds since visit 6 weeks ago.  He is walking 1.5-2 miles 3-4 times/week.  Dietary log reviewed--no sweets, occasional fried foods, carbs (brown rice, corn on the cob)  Past Medical History  Diagnosis Date  . Diabetes mellitus   . Hyperlipidemia   . Hypertension   . GERD (gastroesophageal reflux disease)   . Hemorrhoids     internal and external  . Diverticulosis   . Hay fever   . Hypogonadism male   . Erectile dysfunction    Past Surgical History  Procedure Date  . Circumcision age 53  . Dental surgery     implants   Current Outpatient Prescriptions on File Prior to Visit  Medication Sig Dispense Refill  . aspirin 81 MG tablet Take 81 mg by mouth daily.        Marland Kitchen glucose blood (FREESTYLE LITE) test strip Test daily  100 each  prn  . hydrochlorothiazide (HYDRODIURIL) 25 MG tablet Take 25 mg by mouth daily.      . Lancets (FREESTYLE) lancets Test daily  100 each  prn  . losartan (COZAAR) 50 MG tablet Take 1 tablet (50 mg total) by mouth daily.  30 tablet  5  . pioglitazone (ACTOS) 45 MG tablet Take 1  tablet (45 mg total) by mouth daily.  30 tablet  2  . saxagliptin HCl (ONGLYZA) 2.5 MG TABS tablet Take 1 tablet (2.5 mg total) by mouth daily.  30 tablet  5  . simvastatin (ZOCOR) 20 MG tablet Take 1 tablet (20 mg total) by mouth at bedtime.  30 tablet  0  . DISCONTD: glyBURIDE (DIABETA) 5 MG tablet Take 1 tablet (5 mg total) by mouth 2 (two) times daily with a meal. Start at 1/2 tablet twice daily.  Increase to full tablet twice daily if sugars >160  60 tablet  1  . nebivolol (BYSTOLIC) 10 MG tablet Take 1 tablet (10 mg total) by mouth daily.  30 tablet  1  . niacin (NIASPAN) 500 MG CR tablet Take 1 tablet (500 mg total) by mouth at bedtime.  30 tablet  5  . Testosterone (ANDROGEL PUMP) 1.25 GM/ACT (1%) GEL Place 4 sprays onto the skin daily.  150 g  5  . vardenafil (LEVITRA) 20 MG tablet Take 1 tablet (20 mg total) by mouth daily as needed for erectile dysfunction.  15 tablet  10   Allergies  Allergen Reactions  . Ace Inhibitors     cough   ROS:  Denies headaches, dizziness, chest pain, shortness  of breath, edema, or other concerns.  PHYSICAL EXAM: BP 140/80  Pulse 68  Ht 6\' 3"  (1.905 m)  Wt 256 lb (116.121 kg)  BMI 32.00 kg/m2 Well developed, pleasant male in no distress  ASSESSMENT/PLAN: 1. Type II or unspecified type diabetes mellitus without mention of complication, not stated as uncontrolled   2. Essential hypertension, benign    Continue current regimen.  Try and exercise on a daily basis and continue to lose weight.  If he can do this, perhaps numbers will improve without further med changes. If A1c remains >7, then may need to either increase glyburide (which I prefer to avoid in this patient due to risks of hypoglycemia) vs adding Invokana (and cutting back on glyburide).  Has Welcome to Medicare physical scheduled for October.  Check A1c at visit.

## 2011-11-03 ENCOUNTER — Telehealth: Payer: Self-pay | Admitting: Internal Medicine

## 2011-11-03 DIAGNOSIS — E782 Mixed hyperlipidemia: Secondary | ICD-10-CM

## 2011-11-03 MED ORDER — SIMVASTATIN 20 MG PO TABS: 20.0000 mg | ORAL_TABLET | Freq: Every day | ORAL | Status: DC

## 2011-11-03 NOTE — Telephone Encounter (Signed)
Done

## 2011-11-17 ENCOUNTER — Ambulatory Visit (INDEPENDENT_AMBULATORY_CARE_PROVIDER_SITE_OTHER): Payer: BC Managed Care – PPO | Admitting: Family Medicine

## 2011-11-17 ENCOUNTER — Encounter: Payer: Self-pay | Admitting: Family Medicine

## 2011-11-17 VITALS — BP 138/76 | HR 68 | Temp 97.8°F | Ht 75.0 in | Wt 258.0 lb

## 2011-11-17 DIAGNOSIS — IMO0002 Reserved for concepts with insufficient information to code with codable children: Secondary | ICD-10-CM

## 2011-11-17 DIAGNOSIS — S90424A Blister (nonthermal), right lesser toe(s), initial encounter: Secondary | ICD-10-CM

## 2011-11-17 DIAGNOSIS — Z23 Encounter for immunization: Secondary | ICD-10-CM

## 2011-11-17 MED ORDER — INFLUENZA VIRUS VACC SPLIT PF IM SUSP: 0.5000 mL | Freq: Once | INTRAMUSCULAR | Status: DC

## 2011-11-17 NOTE — Patient Instructions (Addendum)
Keep the area clean.  Use antibacterial ointment such as bacitracin 2-3 times daily (to big toe and 2nd toe).  Return for re-evaluation if increasing size of lesion, ulceration, redness, warmth, swelling, drainage/crusting, pain, fever, or other concerns develop, otherwise, will recheck at your physical in a couple of weeks.

## 2011-11-17 NOTE — Progress Notes (Signed)
Chief Complaint  Patient presents with  . Blister    on right foot-started last Tues, blister came up Wed am.    HPI: 1 weeks ago, on Tuesday night, he noticed that his right 2nd toe looked red.  The following morning he noticed a blister on the top of the toe. He was at the beach all week, and so wore sandals so it wouldn't irritate the blister.  Yesterday he had to wear shoes, and last night he noticed the blister/fluid portion was gone, although didn't notice any drainage or moisture in his sock. He denies any pain, no swelling of toe.  Denies any fevers.  Denies wearing any new shoes or rubbing/irritation prior to onset of the blister (was walking a lot more at the beach, but wearing sandals with open toes).  Denies any known injury, stings, bites or any pain.  Thought possibly could have been a sunburn, but no redness or burn elsewhere on his feet or body.  He reports that his sugars have been doing very well.  Past Medical History  Diagnosis Date  . Diabetes mellitus   . Hyperlipidemia   . Hypertension   . GERD (gastroesophageal reflux disease)   . Hemorrhoids     internal and external  . Diverticulosis   . Hay fever   . Hypogonadism male   . Erectile dysfunction    Past Surgical History  Procedure Date  . Circumcision age 45  . Dental surgery     implants   History   Social History  . Marital Status: Married    Spouse Name: N/A    Number of Children: N/A  . 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 Smoking status: Never Smoker   . Smokeless tobacco: Never Used  . Alcohol Use: Yes     glass of wine once a week  . Drug Use: No  . Sexually Active: Not on file   Other Topics Concern  . Not on file   Social History Narrative   Retired 06/2011    Current Outpatient Prescriptions on File Prior to Visit  Medication Sig Dispense Refill  . aspirin 81 MG tablet Take 81 mg by mouth daily.         Marland Kitchen glucose blood (FREESTYLE LITE) test strip Test daily  100 each  prn  . glyBURIDE (DIABETA) 5 MG tablet Take 5 mg by mouth 2 (two) times daily with a meal.      . hydrochlorothiazide (HYDRODIURIL) 25 MG tablet Take 25 mg by mouth daily.      . Lancets (FREESTYLE) lancets Test daily  100 each  prn  . losartan (COZAAR) 50 MG tablet Take 1 tablet (50 mg total) by mouth daily.  30 tablet  5  . niacin (NIASPAN) 500 MG CR tablet Take 1 tablet (500 mg total) by mouth at bedtime.  30 tablet  5  . Omega-3 Fatty Acids (FISH OIL) 1000 MG CAPS Take 2 capsules by mouth daily.      . pioglitazone (ACTOS) 45 MG tablet Take 1 tablet (45 mg total) by mouth daily.  30 tablet  2  . saxagliptin HCl (ONGLYZA) 2.5 MG TABS tablet Take 1 tablet (2.5 mg total) by mouth daily.  30 tablet  5  . simvastatin (ZOCOR) 20 MG tablet Take 1 tablet (20 mg total) by mouth at bedtime.  30 tablet  0  . Testosterone (  ANDROGEL PUMP) 1.25 GM/ACT (1%) GEL Place 4 sprays onto the skin daily.  150 g  5  . vardenafil (LEVITRA) 20 MG tablet Take 1 tablet (20 mg total) by mouth daily as needed for erectile dysfunction.  15 tablet  10  . nebivolol (BYSTOLIC) 10 MG tablet Take 1 tablet (10 mg total) by mouth daily.  30 tablet  1   Allergies  Allergen Reactions  . Ace Inhibitors     cough   ROS:  Denies fevers, other rashes or skin lesions, nausea, vomiting or diarrhea.   PHYSICAL EXAM: BP 138/76  Pulse 68  Temp 97.8 F (36.6 C) (Oral)  Ht 6\' 3"  (1.905 m)  Wt 258 lb (117.028 kg)  BMI 32.25 kg/m2   1.5-2 cm blister on top of 2nd toe.  The superficial layer of skin is still present over most of the lesions, no significant area of exposed underlying skin present.  There is no erythema, warmth or soft tissue swelling.    There is also a very small area of redness at distal tip of R great toe--no callous formation or blistering, no ulceration.  There is brisk capillary refill, and normal sensation to the  foot.   ASSESSMENT/PLAN: 1. Blister of toe of right foot    not infected    Keep the area clean.  Use antibacterial ointment such as bacitracin 2-3 times daily (to big toe and 2nd toe).  Return for re-evaluation if increasing size of lesion, ulceration, redness, warmth, swelling, drainage/crusting, pain, fever, or other concerns develop, otherwise, will recheck at your physical in a couple of weeks.  Flu shot today (high dose).  F/u as scheduled, sooner prn

## 2011-11-24 ENCOUNTER — Telehealth: Payer: Self-pay | Admitting: Family Medicine

## 2011-11-24 DIAGNOSIS — E119 Type 2 diabetes mellitus without complications: Secondary | ICD-10-CM

## 2011-11-24 MED ORDER — GLYBURIDE 5 MG PO TABS: 5.0000 mg | ORAL_TABLET | Freq: Two times a day (BID) | ORAL | Status: DC

## 2011-11-24 NOTE — Telephone Encounter (Signed)
Done

## 2011-12-01 ENCOUNTER — Ambulatory Visit (INDEPENDENT_AMBULATORY_CARE_PROVIDER_SITE_OTHER): Payer: Medicare Other | Admitting: Family Medicine

## 2011-12-01 ENCOUNTER — Encounter: Payer: Self-pay | Admitting: Family Medicine

## 2011-12-01 VITALS — BP 138/78 | HR 80 | Ht 75.0 in | Wt 257.0 lb

## 2011-12-01 DIAGNOSIS — Z0000 Encounter for general adult medical examination without abnormal findings: Secondary | ICD-10-CM

## 2011-12-01 DIAGNOSIS — Z79899 Other long term (current) drug therapy: Secondary | ICD-10-CM

## 2011-12-01 DIAGNOSIS — E119 Type 2 diabetes mellitus without complications: Secondary | ICD-10-CM

## 2011-12-01 DIAGNOSIS — E291 Testicular hypofunction: Secondary | ICD-10-CM

## 2011-12-01 DIAGNOSIS — N189 Chronic kidney disease, unspecified: Secondary | ICD-10-CM

## 2011-12-01 DIAGNOSIS — I10 Essential (primary) hypertension: Secondary | ICD-10-CM

## 2011-12-01 DIAGNOSIS — E785 Hyperlipidemia, unspecified: Secondary | ICD-10-CM

## 2011-12-01 DIAGNOSIS — Z139 Encounter for screening, unspecified: Secondary | ICD-10-CM

## 2011-12-01 DIAGNOSIS — Z125 Encounter for screening for malignant neoplasm of prostate: Secondary | ICD-10-CM

## 2011-12-01 LAB — CBC WITH DIFFERENTIAL/PLATELET
Basophils Absolute: 0 10*3/uL (ref 0.0–0.1)
Basophils Relative: 1 % (ref 0–1)
Eosinophils Absolute: 0.1 10*3/uL (ref 0.0–0.7)
Eosinophils Relative: 1 % (ref 0–5)
HCT: 43.2 % (ref 39.0–52.0)
Hemoglobin: 14.7 g/dL (ref 13.0–17.0)
Lymphocytes Relative: 15 % (ref 12–46)
Lymphs Abs: 0.9 10*3/uL (ref 0.7–4.0)
MCH: 30.7 pg (ref 26.0–34.0)
MCHC: 34 g/dL (ref 30.0–36.0)
MCV: 90.2 fL (ref 78.0–100.0)
Monocytes Absolute: 0.5 10*3/uL (ref 0.1–1.0)
Monocytes Relative: 8 % (ref 3–12)
Neutro Abs: 4.3 10*3/uL (ref 1.7–7.7)
Neutrophils Relative %: 75 % (ref 43–77)
Platelets: 154 10*3/uL (ref 150–400)
RBC: 4.79 MIL/uL (ref 4.22–5.81)
RDW: 14.2 % (ref 11.5–15.5)
WBC: 5.7 10*3/uL (ref 4.0–10.5)

## 2011-12-01 LAB — LIPID PANEL
Cholesterol: 149 mg/dL (ref 0–200)
HDL: 51 mg/dL (ref >39)
LDL Cholesterol: 83 mg/dL (ref 0–99)
Total CHOL/HDL Ratio: 2.9 Ratio
Triglycerides: 73 mg/dL (ref <150)
VLDL: 15 mg/dL (ref 0–40)

## 2011-12-01 LAB — POCT URINALYSIS DIPSTICK
Bilirubin, UA: NEGATIVE
Blood, UA: NEGATIVE
Glucose, UA: NEGATIVE
Ketones, UA: NEGATIVE
Leukocytes, UA: NEGATIVE
Nitrite, UA: NEGATIVE
Protein, UA: NEGATIVE
Spec Grav, UA: 1.015
Urobilinogen, UA: NEGATIVE
pH, UA: 5

## 2011-12-01 LAB — COMPREHENSIVE METABOLIC PANEL
ALT: 15 U/L (ref 0–53)
AST: 16 U/L (ref 0–37)
Albumin: 4.5 g/dL (ref 3.5–5.2)
Alkaline Phosphatase: 37 U/L — ABNORMAL LOW (ref 39–117)
BUN: 31 mg/dL — ABNORMAL HIGH (ref 6–23)
CO2: 23 mEq/L (ref 19–32)
Calcium: 9.6 mg/dL (ref 8.4–10.5)
Chloride: 100 mEq/L (ref 96–112)
Creat: 1.93 mg/dL — ABNORMAL HIGH (ref 0.50–1.35)
Glucose, Bld: 167 mg/dL — ABNORMAL HIGH (ref 70–99)
Potassium: 4 mEq/L (ref 3.5–5.3)
Sodium: 135 mEq/L (ref 135–145)
Total Bilirubin: 0.7 mg/dL (ref 0.3–1.2)
Total Protein: 7.3 g/dL (ref 6.0–8.3)

## 2011-12-01 LAB — POCT GLYCOSYLATED HEMOGLOBIN (HGB A1C): Hemoglobin A1C: 7.1

## 2011-12-01 LAB — TSH: TSH: 2.113 u[IU]/mL (ref 0.350–4.500)

## 2011-12-01 NOTE — Progress Notes (Signed)
Chief Complaint  Patient presents with  . Welcome to Medicare visit    patient is here for a welcome to Medicare visit.   HPI: James Glover is a 66 y.o. male who presents for a complete physical.  He has the following concerns:  F/u blister on toe--seen 9/18. It is healing--smaller in size, no drainage. Denies fevers.  Never was painful  F/u DM: Last diabetes appt was 8/26, due for A1c today.  We had discussed that if A1c is significantly >7, then need to try Invokana, vs further increase the glyburide (which I don't like to do in older folks with kidney disease).  His sugars are running 73-138 (mostly 100-120) in the evenings, but mornings running higher at 139-173 (mostly 150-160's).  Hyperlipidemia: he stopped taking the Niaspan about 6 months ago, due to cost.  He reports that his cardiologist was aware.  He has appt with him next week.  Doesn't believe lipids were checked since he stopped medication.  He is fasting today.   Hypogonadism:  He needs refill on testosterone replacement--has been out for about a month, requesting refill today.  Due for labs to monitor medication.  OTHER DOCTORS INVOLVED IN PATIENT'S CARE: Dentist: Dr. Darci Needle Ophtho: can't recall name, office on [facility] GI:  Dr. Loreta Ave Nephro: Dr. Arrie Aran Cardiologist: Dr. Alanda Amass Dermatologist: Dr. Danella Deis  Health Maintenance: Immunization History  Administered Date(s) Administered  . DTaP 07/25/2008  . Influenza Split 11/17/2011  . Pneumococcal Polysaccharide 11/29/2009  . Tdap 07/25/2008   Last colonoscopy: 05/05/10 Last PSA: 03/2011, normal Dentist: recent, goes twice yearly Ophtho:  October 2012, due now Exercise: walking 1.5-2 miles about daily.  End of life discussion:  Doesn't have POA or living will  Past Medical History  Diagnosis Date  . Diabetes mellitus   . Hyperlipidemia   . Hypertension   . GERD (gastroesophageal reflux disease)   . Hemorrhoids     internal and external  . Diverticulosis    . Hay fever   . Hypogonadism male   . Erectile dysfunction    Past Surgical History  Procedure Date  . Circumcision age 40  . Dental surgery     implants   History   Social History  . Marital Status: Married    Spouse Name: N/A    Number of Children: 0  . Years of Education: N/A   Occupational History  . 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 Smoking status: Never Smoker   . Smokeless tobacco: Never Used  . Alcohol Use: Yes     glass of wine once a week  . Drug Use: No  . Sexually Active: Not on file   Other Topics Concern  . Not on file   Social History Narrative   Retired 06/2011   Family History  Problem Relation Age of Onset  . Lymphoma Father   . Cancer Father     lymphoma  . Hypertension Father   . Lymphoma Brother   . Cancer Brother     lymphoma  . Lymphoma Paternal Grandfather   . Cancer Paternal Grandfather     lymphoma  . Dementia Mother   . Diabetes Mother   . Diabetes Sister   . Heart disease Neg Hx   . Cancer Brother     skin cancer   Current Outpatient Prescriptions on File Prior to Visit  Medication Sig Dispense Refill  . aspirin 81 MG tablet Take 81 mg by mouth  daily.        . glucose blood (FREESTYLE LITE) test strip Test daily  100 each  prn  . glyBURIDE (DIABETA) 5 MG tablet Take 1 tablet (5 mg total) by mouth 2 (two) times daily with a meal.  60 tablet  2  . hydrochlorothiazide (HYDRODIURIL) 25 MG tablet Take 25 mg by mouth daily.      . Lancets (FREESTYLE) lancets Test daily  100 each  prn  . losartan (COZAAR) 50 MG tablet Take 1 tablet (50 mg total) by mouth daily.  30 tablet  5  . Omega-3 Fatty Acids (FISH OIL) 1000 MG CAPS Take 2 capsules by mouth daily.      . pioglitazone (ACTOS) 45 MG tablet Take 1 tablet (45 mg total) by mouth daily.  30 tablet  2  . saxagliptin HCl (ONGLYZA) 2.5 MG TABS tablet Take 1 tablet (2.5 mg total) by mouth daily.  30 tablet  5  . simvastatin (ZOCOR) 20 MG  tablet Take 1 tablet (20 mg total) by mouth at bedtime.  30 tablet  0  . niacin (NIASPAN) 500 MG CR tablet Take 1 tablet (500 mg total) by mouth at bedtime.  30 tablet  5  . Testosterone (ANDROGEL PUMP) 1.25 GM/ACT (1%) GEL Place 4 sprays onto the skin daily.  150 g  5  . vardenafil (LEVITRA) 20 MG tablet Take 1 tablet (20 mg total) by mouth daily as needed for erectile dysfunction.  15 tablet  10   Allergies  Allergen Reactions  . Ace Inhibitors     cough   ROS:  The patient denies anorexia, fever, weight changes, headaches,  vision loss, decreased hearing, ear pain, hoarseness, chest pain, palpitations, dizziness, syncope, dyspnea on exertion, cough, swelling, nausea, vomiting, diarrhea, constipation, abdominal pain, melena, hematochezia, indigestion/heartburn, hematuria, incontinence, weakened urine stream, dysuria, genital lesions, joint pains, numbness, tingling, weakness, tremor, suspicious skin lesions, depression, anxiety, abnormal bleeding/bruising, or enlarged lymph nodes. Up 2-3 times/night to void. No urinary hesitancy.  PHYSICAL EXAM: BP 138/78  Pulse 80  Ht 6\' 3"  (1.905 m)  Wt 257 lb (116.574 kg)  BMI 32.12 kg/m2  General Appearance:    Alert, cooperative, no distress, appears stated age  Head:    Normocephalic, without obvious abnormality, atraumatic  Eyes:    PERRL, conjunctiva/corneas clear, EOM's intact, fundi    benign  Ears:    Normal TM's and external ear canals  Nose:   Nares normal, mucosa normal, no drainage or sinus   tenderness  Throat:   Lips, mucosa, and tongue normal; teeth and gums normal  Neck:   Supple, no lymphadenopathy;  thyroid:  no   enlargement/tenderness/nodules; no carotid   bruit or JVD  Back:    Spine nontender, no curvature, ROM normal, no CVA     tenderness  Lungs:     Clear to auscultation bilaterally without wheezes, rales or     ronchi; respirations unlabored  Chest Wall:    No tenderness or deformity   Heart:    Regular rate and rhythm,  S1 and S2 normal, no murmur, rub   or gallop  Breast Exam:    No chest wall tenderness, masses or gynecomastia  Abdomen:     Soft, non-tender, nondistended, normoactive bowel sounds,    no masses, no hepatosplenomegaly. Mild ventral hernia, nontender.  Genitalia:    Normal male external genitalia without lesions. +penile piercing. Testicles without masses.  No inguinal hernias.  Rectal:    Normal sphincter tone,  no masses or tenderness; guaiac negative stool.  Prostate smooth, no nodules, not enlarged.  Extremities:   No clubbing, cyanosis or edema  Pulses:   2+ and symmetric all extremities  Skin:   Skin texture, turgor normal, no rashes or lesions. Skin is tanned. Healing lesion on top of R 2nd toe--superficial skin of blister is still intact. No erythema, weeping, crusting, or tenderness.  Lymph nodes:   Cervical, supraclavicular, and axillary nodes normal  Neurologic:   CNII-XII intact, normal strength, sensation and gait; reflexes 2+ and symmetric throughout          Psych:   Normal mood, affect, hygiene and grooming.    Normal diabetic foot exam    Lab Results  Component Value Date   HGBA1C 7.1 12/01/2011   Urine dip normal.  ASSESSMENT/PLAN:  1. Welcome to Medicare preventive visit  POCT Urinalysis Dipstick, Visual acuity screening, PR ELECTROCARDIOGRAM, COMPLETE  2. Type II or unspecified type diabetes mellitus without mention of complication, not stated as uncontrolled  HgB A1c, Comprehensive metabolic panel  3. Male hypogonadism  Testosterone  4. Dyslipidemia  Lipid panel, Comprehensive metabolic panel  5. Essential hypertension, benign  Comprehensive metabolic panel  6. Chronic kidney disease (CKD)  Comprehensive metabolic panel  7. Special screening for malignant neoplasm of prostate  PSA, Medicare  8. Encounter for long-term (current) use of other medications  Comprehensive metabolic panel, CBC with Differential, TSH, PSA, Medicare, Testosterone    Recommended at least  30 minutes of aerobic activity at least 5 days/week; proper sunscreen use reviewed; healthy diet and alcohol recommendations (less than or equal to 2 drinks/day) reviewed; regular seatbelt use; changing batteries in smoke detectors.  Immunization recommendations discussed--see below.  Colonoscopy recommendations reviewed--up to date.  Shingles vaccine recommended--to check with his insurance coverage  Given paperwork on healthcare power of attorney and living will.  MOST form filled out today.  Hyperlipidemia:  Check lipids today (none recorded being checked since he stopped the niaspan).  Send copies of labs to Dr. Arrie Aran and to Dr. Alanda Amass (cardio)--has appointments next week  c-met, lipid, tsh, testosterone, PSA (due to being on testosterone replacement), CBC  DM--borderline control.  I did not want to change meds at this time, but instead have him continue to try and exercise daily, continue to lose weight/abdominal fat, and follow appropriate diet.  F/u 3 months

## 2011-12-01 NOTE — Patient Instructions (Signed)

## 2011-12-02 LAB — PSA, MEDICARE: PSA: 3.06 ng/mL (ref <=4.00)

## 2011-12-02 LAB — TESTOSTERONE: Testosterone: 341.69 ng/dL (ref 300–890)

## 2011-12-03 ENCOUNTER — Telehealth: Payer: Self-pay | Admitting: Family Medicine

## 2011-12-03 NOTE — Telephone Encounter (Signed)
Was this not called in at his visit?  If not, okay to refill x 6 months

## 2011-12-06 ENCOUNTER — Other Ambulatory Visit: Payer: Self-pay | Admitting: *Deleted

## 2011-12-06 ENCOUNTER — Telehealth: Payer: Self-pay | Admitting: Family Medicine

## 2011-12-06 ENCOUNTER — Telehealth: Payer: Self-pay | Admitting: Internal Medicine

## 2011-12-06 DIAGNOSIS — E291 Testicular hypofunction: Secondary | ICD-10-CM

## 2011-12-06 MED ORDER — TESTOSTERONE 12.5 MG/ACT (1%) TD GEL: 4.0000 | Freq: Every day | TRANSDERMAL | Status: DC

## 2011-12-06 NOTE — Telephone Encounter (Signed)
Called into pharmacy

## 2011-12-06 NOTE — Telephone Encounter (Signed)
Done this morning, left message on voicemail at pharmacy.

## 2011-12-07 ENCOUNTER — Telehealth: Payer: Self-pay | Admitting: Family Medicine

## 2011-12-07 NOTE — Telephone Encounter (Signed)
LM

## 2011-12-08 ENCOUNTER — Telehealth: Payer: Self-pay | Admitting: Internal Medicine

## 2011-12-08 DIAGNOSIS — E782 Mixed hyperlipidemia: Secondary | ICD-10-CM

## 2011-12-08 MED ORDER — SIMVASTATIN 20 MG PO TABS: 20.0000 mg | ORAL_TABLET | Freq: Every day | ORAL | Status: DC

## 2011-12-08 NOTE — Telephone Encounter (Signed)
Filled simvastatin 20mg  to [facility] in [place]

## 2011-12-20 ENCOUNTER — Telehealth: Payer: Self-pay | Admitting: Family Medicine

## 2011-12-20 DIAGNOSIS — E119 Type 2 diabetes mellitus without complications: Secondary | ICD-10-CM

## 2011-12-20 MED ORDER — PIOGLITAZONE HCL 45 MG PO TABS: 45.0000 mg | ORAL_TABLET | Freq: Every day | ORAL | Status: DC

## 2011-12-20 NOTE — Telephone Encounter (Signed)
Done

## 2012-01-07 ENCOUNTER — Telehealth: Payer: Self-pay | Admitting: Internal Medicine

## 2012-01-07 DIAGNOSIS — E782 Mixed hyperlipidemia: Secondary | ICD-10-CM

## 2012-01-07 MED ORDER — SIMVASTATIN 20 MG PO TABS: 20.0000 mg | ORAL_TABLET | Freq: Every day | ORAL | Status: DC

## 2012-01-07 NOTE — Telephone Encounter (Signed)
Done

## 2012-01-24 ENCOUNTER — Telehealth: Payer: Self-pay | Admitting: Internal Medicine

## 2012-01-24 DIAGNOSIS — I10 Essential (primary) hypertension: Secondary | ICD-10-CM

## 2012-01-24 MED ORDER — LOSARTAN POTASSIUM 50 MG PO TABS: 50.0000 mg | ORAL_TABLET | Freq: Every day | ORAL | Status: DC

## 2012-01-24 NOTE — Telephone Encounter (Signed)
Done

## 2012-03-03 ENCOUNTER — Other Ambulatory Visit: Payer: Self-pay | Admitting: Internal Medicine

## 2012-03-03 DIAGNOSIS — E119 Type 2 diabetes mellitus without complications: Secondary | ICD-10-CM

## 2012-03-03 MED ORDER — GLYBURIDE 5 MG PO TABS: 5.0000 mg | ORAL_TABLET | Freq: Two times a day (BID) | ORAL | Status: DC

## 2012-03-03 NOTE — Telephone Encounter (Signed)
I sent the patients refill to the pharmacy. CLS

## 2012-03-06 ENCOUNTER — Telehealth: Payer: Self-pay | Admitting: Internal Medicine

## 2012-03-06 DIAGNOSIS — E119 Type 2 diabetes mellitus without complications: Secondary | ICD-10-CM

## 2012-03-06 MED ORDER — GLYBURIDE 5 MG PO TABS: 5.0000 mg | ORAL_TABLET | Freq: Two times a day (BID) | ORAL | Status: DC

## 2012-03-06 NOTE — Telephone Encounter (Signed)
Done

## 2012-03-23 ENCOUNTER — Telehealth: Payer: Self-pay | Admitting: Family Medicine

## 2012-03-23 DIAGNOSIS — E119 Type 2 diabetes mellitus without complications: Secondary | ICD-10-CM

## 2012-03-23 MED ORDER — PIOGLITAZONE HCL 45 MG PO TABS: 45.0000 mg | ORAL_TABLET | Freq: Every day | ORAL | Status: DC

## 2012-03-23 NOTE — Telephone Encounter (Signed)
Done

## 2012-04-12 ENCOUNTER — Telehealth: Payer: Self-pay | Admitting: Internal Medicine

## 2012-04-12 DIAGNOSIS — E119 Type 2 diabetes mellitus without complications: Secondary | ICD-10-CM

## 2012-04-12 MED ORDER — SAXAGLIPTIN HCL 2.5 MG PO TABS: 2.5000 mg | ORAL_TABLET | Freq: Every day | ORAL | Status: DC

## 2012-04-12 MED ORDER — GLUCOSE BLOOD VI STRP: ORAL_STRIP | Status: DC

## 2012-04-12 NOTE — Telephone Encounter (Signed)
Done

## 2012-06-05 ENCOUNTER — Telehealth: Payer: Self-pay | Admitting: Internal Medicine

## 2012-06-05 DIAGNOSIS — E291 Testicular hypofunction: Secondary | ICD-10-CM

## 2012-06-05 MED ORDER — TESTOSTERONE 12.5 MG/ACT (1%) TD GEL: 4.0000 | Freq: Every day | TRANSDERMAL | Status: DC

## 2012-06-05 NOTE — Telephone Encounter (Signed)
Phoned in.

## 2012-06-05 NOTE — Telephone Encounter (Signed)
Refill request for androgel 1% pump to [facility] to [id number]

## 2012-06-05 NOTE — Telephone Encounter (Signed)
Ok to refill x 1 month, has appt later this month

## 2012-06-21 ENCOUNTER — Ambulatory Visit (INDEPENDENT_AMBULATORY_CARE_PROVIDER_SITE_OTHER): Payer: Medicare PPO | Admitting: Family Medicine

## 2012-06-21 ENCOUNTER — Other Ambulatory Visit (HOSPITAL_COMMUNITY): Payer: Self-pay | Admitting: Cardiovascular Disease

## 2012-06-21 ENCOUNTER — Encounter: Payer: Self-pay | Admitting: Family Medicine

## 2012-06-21 VITALS — BP 142/80 | HR 64 | Ht 75.0 in | Wt 259.0 lb

## 2012-06-21 DIAGNOSIS — I10 Essential (primary) hypertension: Secondary | ICD-10-CM

## 2012-06-21 DIAGNOSIS — E1121 Type 2 diabetes mellitus with diabetic nephropathy: Secondary | ICD-10-CM

## 2012-06-21 DIAGNOSIS — E782 Mixed hyperlipidemia: Secondary | ICD-10-CM

## 2012-06-21 DIAGNOSIS — IMO0002 Reserved for concepts with insufficient information to code with codable children: Secondary | ICD-10-CM | POA: Insufficient documentation

## 2012-06-21 DIAGNOSIS — Z79899 Other long term (current) drug therapy: Secondary | ICD-10-CM

## 2012-06-21 DIAGNOSIS — I714 Abdominal aortic aneurysm, without rupture, unspecified: Secondary | ICD-10-CM

## 2012-06-21 DIAGNOSIS — E1165 Type 2 diabetes mellitus with hyperglycemia: Secondary | ICD-10-CM

## 2012-06-21 DIAGNOSIS — E119 Type 2 diabetes mellitus without complications: Secondary | ICD-10-CM

## 2012-06-21 DIAGNOSIS — E1129 Type 2 diabetes mellitus with other diabetic kidney complication: Secondary | ICD-10-CM

## 2012-06-21 DIAGNOSIS — E785 Hyperlipidemia, unspecified: Secondary | ICD-10-CM

## 2012-06-21 DIAGNOSIS — N058 Unspecified nephritic syndrome with other morphologic changes: Secondary | ICD-10-CM

## 2012-06-21 DIAGNOSIS — N189 Chronic kidney disease, unspecified: Secondary | ICD-10-CM

## 2012-06-21 DIAGNOSIS — E291 Testicular hypofunction: Secondary | ICD-10-CM

## 2012-06-21 LAB — POCT GLYCOSYLATED HEMOGLOBIN (HGB A1C): Hemoglobin A1C: 7.6

## 2012-06-21 MED ORDER — SIMVASTATIN 20 MG PO TABS: 20.0000 mg | ORAL_TABLET | Freq: Every day | ORAL | Status: DC

## 2012-06-21 MED ORDER — SAXAGLIPTIN HCL 2.5 MG PO TABS: 2.5000 mg | ORAL_TABLET | Freq: Every day | ORAL | Status: DC

## 2012-06-21 MED ORDER — HYDROCHLOROTHIAZIDE 25 MG PO TABS: 25.0000 mg | ORAL_TABLET | Freq: Every day | ORAL | Status: DC

## 2012-06-21 MED ORDER — PIOGLITAZONE HCL 45 MG PO TABS: 45.0000 mg | ORAL_TABLET | Freq: Every day | ORAL | Status: DC

## 2012-06-21 MED ORDER — GLYBURIDE 5 MG PO TABS: 5.0000 mg | ORAL_TABLET | Freq: Two times a day (BID) | ORAL | Status: DC

## 2012-06-21 NOTE — Patient Instructions (Signed)
Please continue to check your blood pressure at home, and bring your monitor to your next visit.  Try to cut back on portion sizes, continue to avoid sugar.  Cut back on "white" foods (buns, bread, etc).  Try and lose a little weight. If your A1c rises, we may need to consider starting insulin to avoid complications of diabetes.  Continue your current medications.  We will be in touch with your lab results

## 2012-06-21 NOTE — Progress Notes (Signed)
Chief Complaint  Patient presents with  . Diabetes    fasting med check.Sees Dr.Tanner for diabetic eye exams, has appt scheduled in 2 weeks.   Diabetes follow-up:  Blood sugars at home are running 80-120 in the evenings, higher in the mornings, running 120-145.  Denies hypoglycemia.  Denies polydipsia and polyuria.  Eye exam is scheduled in 2 weeks.  Patient follows a low sugar diet and checks feet regularly without concerns. Last diabetic foot exam here was 11/2011.  Hypertension follow-up:  Blood pressures elsewhere are 82/59 yesterday after exercise. He did not feel dizzy. Usually runs 112-115/60's.  Denies dizziness, headaches, chest pain.  Denies side effects of medications.  Hyperlipidemia follow-up:  Patient is reportedly following a low-fat, low cholesterol diet.  Compliant with medications and denies medication side effects.  He has been off of the Niaspan at least 6 months, recalls that he didn't tolerate it. Initially told me it was due to cough, but chart was reviewed, and ACEI caused cough.  Sees Dr. Abel Presto, last seen 2 weeks ago.  Feet were checked, and were fine.  He was happy with his exercise program.  He recalls that his Cr was 1.68. Saw Dr. Alanda Amass yesterday--said he was doing fine.  BP was reportedly 130/70 at his office.  No chest pain or shortness of breath, edema.  Saw dermatologist yesterday--had something treated on his lip, and was told that he might get a fever blister, so was started on Valtrex x 2 doses.  He has been exercising regularly, and pants are fitting a little looser.   Past Medical History  Diagnosis Date  . Diabetes mellitus   . Hyperlipidemia   . Hypertension   . GERD (gastroesophageal reflux disease)   . Hemorrhoids     internal and external  . Diverticulosis   . Hay fever   . Hypogonadism male   . Erectile dysfunction    Past Surgical History  Procedure Laterality Date  . Circumcision  age 67  . Dental surgery      implants    History   Social History  . Marital Status: Married    Spouse Name: N/A    Number of Children: 0  . Years of Education: N/A   Occupational History  . 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 Smoking status: Never Smoker   . Smokeless tobacco: Never Used  . Alcohol Use: Yes     Comment: glass of wine once a week  . Drug Use: No  . Sexually Active: Not on file   Other Topics Concern  . Not on file   Social History Narrative   Retired 06/2011   Current Outpatient Prescriptions on File Prior to Visit  Medication Sig Dispense Refill  . aspirin 81 MG tablet Take 81 mg by mouth daily.        Marland Kitchen glucose blood (FREESTYLE LITE) test strip Test daily  100 each  prn  . Lancets (FREESTYLE) lancets Test daily  100 each  prn  . losartan (COZAAR) 50 MG tablet Take 1 tablet (50 mg total) by mouth daily.  30 tablet  5  . Omega-3 Fatty Acids (FISH OIL) 1000 MG CAPS Take 2 capsules by mouth daily.      . Testosterone (ANDROGEL PUMP) 12.5 MG/ACT (1%) GEL Place 4 sprays onto the skin daily.  150 g  0  . niacin (NIASPAN) 500 MG CR tablet Take 1 tablet (500 mg total) by mouth at bedtime.  30 tablet  5  . vardenafil (LEVITRA) 20 MG tablet Take 1 tablet (20 mg total) by mouth daily as needed for erectile dysfunction.  15 tablet  10   No current facility-administered medications on file prior to visit.   Allergies  Allergen Reactions  . Ace Inhibitors     cough   ROS:  Denies fevers, URI symptoms, cough, shortness of breath, chest pain, palpitations, dizziness, headaches, urinary complaints, bowel complaints, skin rashes (other than treatment given by derm yest).  Denies depression, numbness, tingling, weakness, joint pains or other concerns.  See HPI.  PHYSICAL EXAM: BP 142/80  Pulse 64  Ht 6\' 3"  (1.905 m)  Wt 259 lb (117.482 kg)  BMI 32.37 kg/m2 152/70 on repeat by MD.  Well developed, pleasant overweight male in no distress.  Mildly excitable. Neck:  no lymphadenopathy, thyromegaly or carotid bruit  Heart: regular rate and rhythm no murmur, rub or gallop  Lungs: clear bilaterally  Abdomen: soft, nontender, no organomegaly or mass. +abdominal obesity  Extremities: 2+ distal pulses. No edema Psych: normal mood, affect, hygiene, grooming  Skin: no rash.  Feet examined--normal sensation, no lesions. Neuro: alert and oriented. Normal gait, cranial nerves grossly intact. Normal sensation, strength  Lab Results  Component Value Date   HGBA1C 7.6 06/21/2012   ASSESSMENT/PLAN:  Type II or unspecified type diabetes mellitus without mention of complication, not stated as uncontrolled - worsening control.  goal A1c <7 given his CKD, but balance risk of hypoglycemia and med side effects. Reviewed diet, wt loss.  May need insulin--refuses today. - Plan: HgB A1c, Comprehensive metabolic panel, glyBURIDE (DIABETA) 5 MG tablet, pioglitazone (ACTOS) 45 MG tablet, saxagliptin HCl (ONGLYZA) 2.5 MG TABS tablet  Male hypogonadism - labs due - Plan: Testosterone, PSA, Medicare  Dyslipidemia - labs due--send copies to Dr. Alanda Amass - Plan: Lipid panel, Comprehensive metabolic panel  Chronic kidney disease (CKD) - stable  Essential hypertension, benign - conrolled per history - Plan: Comprehensive metabolic panel, hydrochlorothiazide (HYDRODIURIL) 25 MG tablet  Encounter for long-term (current) use of other medications - Plan: Lipid panel, Comprehensive metabolic panel, CBC with Differential, Testosterone, PSA, Medicare  Mixed hyperlipidemia - Plan: simvastatin (ZOCOR) 20 MG tablet  Uncontrolled type II diabetes mellitus with nephropathy  Diet reviewed. Encouraged weight loss. No change in meds at this point, as he declines starting insulin, prefers recheck in 3 months.  F/u 3 months, nonfasting, A1c at visit. Might need insulin if A1c rises.  Advised to bring his BP monitor to his next visit to verify accuracy.  Will need to send copies of labs to  Dr. Alanda Amass.

## 2012-06-22 ENCOUNTER — Telehealth: Payer: Self-pay | Admitting: Internal Medicine

## 2012-06-22 DIAGNOSIS — N529 Male erectile dysfunction, unspecified: Secondary | ICD-10-CM

## 2012-06-22 LAB — CBC WITH DIFFERENTIAL/PLATELET
Basophils Absolute: 0 10*3/uL (ref 0.0–0.1)
Basophils Relative: 1 % (ref 0–1)
Eosinophils Absolute: 0.2 10*3/uL (ref 0.0–0.7)
Eosinophils Relative: 3 % (ref 0–5)
HCT: 42.7 % (ref 39.0–52.0)
Hemoglobin: 14.5 g/dL (ref 13.0–17.0)
Lymphocytes Relative: 17 % (ref 12–46)
Lymphs Abs: 1 10*3/uL (ref 0.7–4.0)
MCH: 31.1 pg (ref 26.0–34.0)
MCHC: 34 g/dL (ref 30.0–36.0)
MCV: 91.6 fL (ref 78.0–100.0)
Monocytes Absolute: 0.5 10*3/uL (ref 0.1–1.0)
Monocytes Relative: 9 % (ref 3–12)
Neutro Abs: 4.2 10*3/uL (ref 1.7–7.7)
Neutrophils Relative %: 70 % (ref 43–77)
Platelets: 137 10*3/uL — ABNORMAL LOW (ref 150–400)
RBC: 4.66 MIL/uL (ref 4.22–5.81)
RDW: 14.6 % (ref 11.5–15.5)
WBC: 5.8 10*3/uL (ref 4.0–10.5)

## 2012-06-22 LAB — LIPID PANEL
Cholesterol: 140 mg/dL (ref 0–200)
HDL: 54 mg/dL (ref >39)
LDL Cholesterol: 73 mg/dL (ref 0–99)
Total CHOL/HDL Ratio: 2.6 Ratio
Triglycerides: 65 mg/dL (ref <150)
VLDL: 13 mg/dL (ref 0–40)

## 2012-06-22 LAB — TESTOSTERONE: Testosterone: 678 ng/dL (ref 300–890)

## 2012-06-22 LAB — COMPREHENSIVE METABOLIC PANEL
ALT: 16 U/L (ref 0–53)
AST: 16 U/L (ref 0–37)
Albumin: 4.4 g/dL (ref 3.5–5.2)
Alkaline Phosphatase: 36 U/L — ABNORMAL LOW (ref 39–117)
BUN: 23 mg/dL (ref 6–23)
CO2: 25 mEq/L (ref 19–32)
Calcium: 9.5 mg/dL (ref 8.4–10.5)
Chloride: 102 mEq/L (ref 96–112)
Creat: 1.83 mg/dL — ABNORMAL HIGH (ref 0.50–1.35)
Glucose, Bld: 146 mg/dL — ABNORMAL HIGH (ref 70–99)
Potassium: 4.5 mEq/L (ref 3.5–5.3)
Sodium: 136 mEq/L (ref 135–145)
Total Bilirubin: 0.7 mg/dL (ref 0.3–1.2)
Total Protein: 7 g/dL (ref 6.0–8.3)

## 2012-06-22 LAB — PSA, MEDICARE: PSA: 3.03 ng/mL (ref <=4.00)

## 2012-06-22 MED ORDER — VARDENAFIL HCL 20 MG PO TABS: 20.0000 mg | ORAL_TABLET | Freq: Every day | ORAL | Status: DC | PRN

## 2012-06-22 NOTE — Telephone Encounter (Signed)
done

## 2012-06-22 NOTE — Telephone Encounter (Signed)
Pt would like a refill on his levitra. He forgot to ask you when he was here. Please send to [facility] [place]

## 2012-06-27 ENCOUNTER — Encounter: Payer: Self-pay | Admitting: Family Medicine

## 2012-07-12 ENCOUNTER — Ambulatory Visit (HOSPITAL_COMMUNITY)
Admission: RE | Admit: 2012-07-12 | Discharge: 2012-07-12 | Disposition: A | Discharge status: Home / Self Care | Payer: Medicare FFS | Source: Ambulatory Visit | Attending: Cardiovascular Disease | Admitting: Cardiovascular Disease

## 2012-07-12 DIAGNOSIS — I714 Abdominal aortic aneurysm, without rupture, unspecified: Secondary | ICD-10-CM

## 2012-07-12 NOTE — Progress Notes (Signed)
Abdominal Duplex Completed. °Brianna L Mazza ° °

## 2012-08-21 ENCOUNTER — Other Ambulatory Visit: Payer: Self-pay | Admitting: Family Medicine

## 2012-09-21 ENCOUNTER — Other Ambulatory Visit: Payer: Self-pay | Admitting: Family Medicine

## 2012-09-25 ENCOUNTER — Other Ambulatory Visit: Payer: Self-pay | Admitting: Family Medicine

## 2012-10-02 ENCOUNTER — Ambulatory Visit (INDEPENDENT_AMBULATORY_CARE_PROVIDER_SITE_OTHER): Payer: Medicare PPO | Admitting: Family Medicine

## 2012-10-02 ENCOUNTER — Encounter: Payer: Self-pay | Admitting: Family Medicine

## 2012-10-02 VITALS — BP 128/80 | HR 72 | Ht 75.0 in | Wt 257.0 lb

## 2012-10-02 DIAGNOSIS — E785 Hyperlipidemia, unspecified: Secondary | ICD-10-CM

## 2012-10-02 DIAGNOSIS — I10 Essential (primary) hypertension: Secondary | ICD-10-CM

## 2012-10-02 DIAGNOSIS — E669 Obesity, unspecified: Secondary | ICD-10-CM

## 2012-10-02 DIAGNOSIS — Z79899 Other long term (current) drug therapy: Secondary | ICD-10-CM

## 2012-10-02 DIAGNOSIS — E291 Testicular hypofunction: Secondary | ICD-10-CM

## 2012-10-02 DIAGNOSIS — E119 Type 2 diabetes mellitus without complications: Secondary | ICD-10-CM

## 2012-10-02 DIAGNOSIS — Z125 Encounter for screening for malignant neoplasm of prostate: Secondary | ICD-10-CM

## 2012-10-02 LAB — POCT GLYCOSYLATED HEMOGLOBIN (HGB A1C): Hemoglobin A1C: 6.1

## 2012-10-02 MED ORDER — LOSARTAN POTASSIUM 50 MG PO TABS: ORAL_TABLET | ORAL | Status: DC

## 2012-10-02 MED ORDER — GLYBURIDE 5 MG PO TABS: 5.0000 mg | ORAL_TABLET | Freq: Two times a day (BID) | ORAL | Status: DC

## 2012-10-02 MED ORDER — SAXAGLIPTIN HCL 2.5 MG PO TABS: 2.5000 mg | ORAL_TABLET | Freq: Every day | ORAL | Status: DC

## 2012-10-02 NOTE — Patient Instructions (Signed)
Congratulations!  Your sugars are so much better since you cut back on the sweets in your diet.  Keep up the good work.  Cut back on glyburide dose to 1/2 tablet in the morning.  Continue with full tablet at dinner.  If sugars remain frequently <70, then would further cut back to 1/2 tablet twice daily.  You can always call with your blood sugar readings (or drop them by/mail them in) for my review if you are concerned.

## 2012-10-02 NOTE — Progress Notes (Signed)
Chief Complaint  Patient presents with  . Diabetes    nonfasting med check.    Patient presents for 3 month follow-up on diabetes.  At last visit his A1c was 7.6, and he declined starting insulin.  Since that time he gave up sweets.  He has been going to the gym every day, walking 6-7 miles and does weights.  He is feeling good (he felt good when eating sweets too!).  He is a little frustrated that his weight hasn't changed.  He is eating healthy.  Last eye exam was May. His sugars have been running 57-123 in the morning (frequently <80), 66-114 prior to dinner. He denies hypoglycemic symptoms  HTN--Blood pressures after exercise range from 92-125/59-71.  Doesn't check prior to exercise.  Denies any dizziness, headaches.  No chest or shortness of breath.  He saw Dr. Abel Presto 4 weeks ago,  had labs (copies not received here), wasn't called about any abnormal tests or need to schedule f/u on labs.  Past Medical History  Diagnosis Date  . Diabetes mellitus   . Hyperlipidemia   . Hypertension   . GERD (gastroesophageal reflux disease)   . Hemorrhoids     internal and external  . Diverticulosis   . Hay fever   . Hypogonadism male   . Erectile dysfunction    Past Surgical History  Procedure Laterality Date  . Circumcision  age 28  . Dental surgery      implants   History   Social History  . Marital Status: Married    Spouse Name: N/A    Number of Children: 0  . Years of Education: N/A   Occupational History  . 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 Smoking status: Never Smoker   . Smokeless tobacco: Never Used  . Alcohol Use: Yes     Comment: glass of wine once a week  . Drug Use: No  . Sexually Active: Not on file   Other Topics Concern  . Not on file   Social History Narrative   Retired 06/2011   Current outpatient prescriptions:aspirin 81 MG tablet, Take 81 mg by mouth daily.  , Disp: , Rfl: ;  glucose blood (FREESTYLE LITE)  test strip, Test daily, Disp: 100 each, Rfl: prn;  glyBURIDE (DIABETA) 5 MG tablet, Take 1 tablet (5 mg total) by mouth 2 (two) times daily with a meal., Disp: 60 tablet, Rfl: 5;  hydrochlorothiazide (HYDRODIURIL) 25 MG tablet, Take 1 tablet (25 mg total) by mouth daily., Disp: 30 tablet, Rfl: 5 Lancets (FREESTYLE) lancets, Test daily, Disp: 100 each, Rfl: prn;  losartan (COZAAR) 50 MG tablet, TAKE 1 TABLET BY MOUTH EVERY DAY, Disp: 30 tablet, Rfl: 5;  Omega-3 Fatty Acids (FISH OIL) 1000 MG CAPS, Take 2 capsules by mouth daily., Disp: , Rfl: ;  pioglitazone (ACTOS) 45 MG tablet, TAKE 1 TABLET BY MOUTH EVERY DAY, Disp: 30 tablet, Rfl: 2 saxagliptin HCl (ONGLYZA) 2.5 MG TABS tablet, Take 1 tablet (2.5 mg total) by mouth daily., Disp: 30 tablet, Rfl: 5;  simvastatin (ZOCOR) 20 MG tablet, Take 1 tablet (20 mg total) by mouth at bedtime., Disp: 30 tablet, Rfl: 5;  Testosterone (ANDROGEL PUMP) 12.5 MG/ACT (1%) GEL, Place 4 sprays onto the skin daily., Disp: 150 g, Rfl: 0 vardenafil (LEVITRA) 20 MG tablet, Take 1 tablet (20 mg total) by mouth daily as needed for erectile dysfunction., Disp: 15 tablet, Rfl: 10;  valACYclovir (VALTREX) 1000 MG tablet, Take 2,000  mg by mouth once., Disp: , Rfl:  Pt admits to using only 2 sprays/day of testosterone rather than 4--feels good, and makes it lasts longer  Allergies  Allergen Reactions  . Ace Inhibitors     cough    ROS:  Denies headaches, dizziness, chest pain, shortness of breath, URI symptoms, fevers, rashes.  Energy is good.  Denies depression.  No numbness/tingling, depression or other concerns.  PHYSICAL EXAM: BP 128/80  Pulse 72  Ht 6\' 3"  (1.905 m)  Wt 257 lb (116.574 kg)  BMI 32.12 kg/m2 Well developed, obese male in no distress Heart: regular rate and rhythm without murmur Lungs: clear bilaterally Abdomen: soft, nontender, no mass.  Abdominal obesity Extremities: no edema Skin: no rash Psych: normal mood, affect, hygiene and grooming  Lab  Results  Component Value Date   HGBA1C 6.1 10/02/2012    ASSESSMENT/PLAN:  Type II or unspecified type diabetes mellitus without mention of complication, not stated as uncontrolled - improved control - Plan: HgB A1c, saxagliptin HCl (ONGLYZA) 2.5 MG TABS tablet, glyBURIDE (DIABETA) 5 MG tablet  Essential hypertension, benign - controlled - Plan: losartan (COZAAR) 50 MG tablet  Obesity (BMI 30.0-34.9)  Diabetes is much improved.  No longer recommend the need to start insulin.  He is having some hypoglycemia, so will taper back on glyburide slowly.  Continue to exercise daily, avoid sweets.  Hopefully with healthy diet and daily exercise, we will start to see some weight loss (of abdominal fat).  Discussed portion control.  Cut back on glyburide dose to 1/2 tablet in the morning.  Continue with full tablet at dinner.  If sugars remain frequently <70, then would further cut back to 1/2 tablet twice daily.  You can always call with your blood sugar readings (or drop them by/mail them in) for my review if you are concerned.  F/u at CPE next available (due in October, likely will be December/January) Get labs done sometimes in 3-4 months

## 2012-10-04 ENCOUNTER — Other Ambulatory Visit: Payer: Self-pay

## 2012-11-28 ENCOUNTER — Other Ambulatory Visit: Payer: Self-pay | Admitting: Family Medicine

## 2012-11-28 NOTE — Telephone Encounter (Signed)
Yes--ok with 1 refill (next physical and labs is in early December)

## 2012-11-28 NOTE — Telephone Encounter (Signed)
IS THIS OK 

## 2012-11-29 ENCOUNTER — Telehealth: Payer: Self-pay | Admitting: Family Medicine

## 2012-11-29 NOTE — Telephone Encounter (Signed)
Pharmacist Marolyn Haller from [facility] called to notify they did a Comprehensive Med. Review on him and was alerted for his Glyburide 5 mg that he is currently taking was flagged for a high risk med for elderly and affects are severe prolonged hyperglycemia.  Recommendations for alternative is Glipizide/ Glimepiride.  He has a return apt on 02/15/13, unless you would like to reevaluate him sooner.

## 2012-11-29 NOTE — Telephone Encounter (Signed)
I had tried getting him off the glyburide in the past (not liking that class of med in general for him). I'm okay with changing to glipizide, but we can discuss this at his next visit, rather than changing now.  Hasn't had any hypoglycemia, and pt aware to contact us if he starts having any.  Info put on sticky note in chart

## 2012-12-18 ENCOUNTER — Other Ambulatory Visit: Payer: Self-pay | Admitting: Family Medicine

## 2012-12-21 ENCOUNTER — Encounter: Payer: Self-pay | Admitting: Cardiovascular Disease

## 2012-12-21 ENCOUNTER — Ambulatory Visit (INDEPENDENT_AMBULATORY_CARE_PROVIDER_SITE_OTHER): Payer: Medicare FFS | Admitting: Cardiovascular Disease

## 2012-12-21 VITALS — BP 140/88 | HR 63 | Ht 76.0 in | Wt 259.5 lb

## 2012-12-21 DIAGNOSIS — I10 Essential (primary) hypertension: Secondary | ICD-10-CM

## 2012-12-21 DIAGNOSIS — N189 Chronic kidney disease, unspecified: Secondary | ICD-10-CM

## 2012-12-21 DIAGNOSIS — N2889 Other specified disorders of kidney and ureter: Secondary | ICD-10-CM

## 2012-12-21 DIAGNOSIS — E785 Hyperlipidemia, unspecified: Secondary | ICD-10-CM

## 2012-12-21 DIAGNOSIS — N182 Chronic kidney disease, stage 2 (mild): Secondary | ICD-10-CM

## 2012-12-21 NOTE — Patient Instructions (Signed)
Your physician recommends that you schedule a follow-up appointment in:  1 YEAR. NO CHANGES WERE MADE TODAY IN YOUR THERAPY. 

## 2012-12-25 ENCOUNTER — Other Ambulatory Visit: Payer: Self-pay | Admitting: Family Medicine

## 2013-01-04 ENCOUNTER — Other Ambulatory Visit: Payer: Self-pay

## 2013-01-09 ENCOUNTER — Other Ambulatory Visit: Payer: Self-pay | Admitting: Family Medicine

## 2013-01-27 ENCOUNTER — Encounter: Payer: Self-pay | Admitting: Cardiovascular Disease

## 2013-01-27 DIAGNOSIS — N182 Chronic kidney disease, stage 2 (mild): Secondary | ICD-10-CM | POA: Insufficient documentation

## 2013-01-27 DIAGNOSIS — I10 Essential (primary) hypertension: Secondary | ICD-10-CM | POA: Insufficient documentation

## 2013-01-27 NOTE — Progress Notes (Signed)
Patient ID: James Glover, male   DOB: 06-02-1945, 67 y.o.   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 He is a former patient of Dr. Alanda Amass and last saw Dr. Alanda Amass in his solo practice approximally 7 months ago.   HPI: Mr. Hardage has a history of hypertension, hyperlipidemia, as well as type 2 diabetes mellitus with renal insufficiency. He is followed by Dr. Reynolds Bowl although from his renal perspective. In October 2013 his BUN was 31 and creatinine 1.93. The patient admits to a 12 year history of diabetes mellitus. He has been felt to have stage II kidney disease and has been on losartan. The patient has remained very active and walks approximately 6 miles per day. [facility] He participates in [employer]. 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  Of note, his last lipid study in October 2013 revealed a cholesterol of 149 triglycerides 73 HDL 51 LDL 83. The patient had an echo Doppler study in 2011 which showed borderline concentric LVH. There was evidence for normal systolic and diastolic function. He had mild left atrial dilatation and mild mitral regurgitation. Mild tricuspid regurgitation with mild pulmonary hypertension with estimated RV systolic pressure 34 mm.  In March 2011 a nuclear perfusion study revealed normal perfusion without scar or ischemia. He did have an abdominal ultrasound in May 2014 which showed normal taper of his abdominal aorta.  Past Medical History  Diagnosis Date  . Diabetes mellitus   . Hyperlipidemia   . Hypertension   . GERD (gastroesophageal reflux disease)   . Hemorrhoids     internal and external  . Diverticulosis   . Hay fever   . Hypogonadism male   . Erectile dysfunction     Past Surgical History  Procedure Laterality Date  .  Circumcision  age 86  . Dental surgery      implants    Allergies  Allergen Reactions  . Ace Inhibitors     cough    Current Outpatient Prescriptions  Medication Sig Dispense Refill  . aspirin 81 MG tablet Take 81 mg by mouth daily.        Marland Kitchen glyBURIDE (DIABETA) 5 MG tablet Take by mouth. Take 2.5 mg in the AM and 5 mg in PM      . Lancets (FREESTYLE) lancets Test daily  100 each  prn  . losartan (COZAAR) 50 MG tablet TAKE 1 TABLET BY MOUTH EVERY DAY  30 tablet  5  . Omega-3 Fatty Acids (FISH OIL) 1000 MG CAPS Take 2 capsules by mouth daily.      . ONGLYZA 2.5 MG TABS tablet TAKE 1 TABLET BY MOUTH EVERY DAY  30 tablet  1  . simvastatin (ZOCOR) 20 MG tablet Take 1 tablet (20 mg total) by mouth at bedtime.  30 tablet  5  . Testosterone (ANDROGEL PUMP) 12.5 MG/ACT (1%) GEL 2 pumps daily      . vardenafil (LEVITRA) 20 MG tablet Take 1 tablet (20 mg total) by mouth daily as needed for erectile dysfunction.  15 tablet  10  . hydrochlorothiazide (HYDRODIURIL) 25 MG tablet TAKE 1 TABLET BY MOUTH EVERY DAY  30 tablet  0  . pioglitazone (ACTOS) 45 MG tablet TAKE 1 TABLET BY MOUTH EVERY DAY  30 tablet  2  No current facility-administered medications for this visit.    Social history is notable in that he is married. He does not have children. He exercises regularly. He has a retirement place at [facility].  Family History  Problem Relation Age of Onset  . Lymphoma Father   . Cancer Father     lymphoma  . Hypertension Father   . Lymphoma Brother   . Cancer Brother     lymphoma  . Lymphoma Paternal Grandfather   . Cancer Paternal Grandfather     lymphoma  . Dementia Mother   . Diabetes Mother   . Diabetes Sister   . Heart disease Neg Hx   . Cancer Brother     skin cancer    ROS is negative for fever chills or night sweats. He denies skin changes. He denies changes in vision or hearing. He is unaware of lymphadenopathy. He denies cough or increased sputum production or wheezing.  He is unaware of palpitations. He denies presyncope or syncope. He denies chest pressure. He denies nausea vomiting or diarrhea. In the past he was told of having mild BPH with nocturia one to 2 times per night. He has taken AndroGel pump for low testosterone in the past. He also takes Levitra on a PRN basis for mild erectile dysfunction. He denies claudication. He denies myalgias. He denies tremors. He is unaware of sleep disordered breathing. He does have diabetes mellitus. He is unaware of cold or heat intolerance or other endocrine problems. Other comprehensive 14 point system review is negative.  PE BP 140/88  Pulse 63  Ht 6\' 4"  (1.93 m)  Wt 259 lb 8 oz (117.708 kg)  BMI 31.60 kg/m2 General: Alert, oriented, no distress.  Skin: normal turgor, no rashes HEENT: Normocephalic, atraumatic. Pupils round and reactive; sclera anicteric; Fundi without hemorrhages or exudates Nose without nasal septal hypertrophy Mouth/Parynx benign; Mallinpatti scale 2 Neck: No JVD, no carotid bruits Lungs: clear to ausculatation and percussion; no wheezing or rales Heart: RRR, s1 s2 normal 1-2/6 systolic murmur along the left sternal border. Abdomen: soft, nontender; no hepatosplenomehaly, BS+; abdominal aorta nontender and not dilated by palpation. Pulses 2+ Extremities: no clubbinbg cyanosis or edema, Homan's sign negative  Neurologic: grossly nonfocal Psychologic: Normal mood and affect    ECG: Sinus rhythm with first-degree AV block with PR interval of 216 ms. Normal QTc interval.  I've also we reviewed a blood pressure log dating from 07/03/2012 as well as diet and exercise protocols  LABS:  BMET    Component Value Date/Time   NA 136 06/21/2012 1101   K 4.5 06/21/2012 1101   CL 102 06/21/2012 1101   CO2 25 06/21/2012 1101   GLUCOSE 146* 06/21/2012 1101   BUN 23 06/21/2012 1101   CREATININE 1.83* 06/21/2012 1101   CALCIUM 9.5 06/21/2012 1101     Hepatic Function Panel     Component Value  Date/Time   PROT 7.0 06/21/2012 1101   ALBUMIN 4.4 06/21/2012 1101   AST 16 06/21/2012 1101   ALT 16 06/21/2012 1101   ALKPHOS 36* 06/21/2012 1101   BILITOT 0.7 06/21/2012 1101   BILIDIR 0.1 03/18/2011 0917   IBILI 0.5 03/18/2011 0917     CBC    Component Value Date/Time   WBC 5.8 06/21/2012 1101   RBC 4.66 06/21/2012 1101   HGB 14.5 06/21/2012 1101   HCT 42.7 06/21/2012 1101   PLT 137* 06/21/2012 1101   MCV 91.6 06/21/2012 1101   MCH 31.1 06/21/2012 1101  MCHC 34.0 06/21/2012 1101   RDW 14.6 06/21/2012 1101   LYMPHSABS 1.0 06/21/2012 1101   MONOABS 0.5 06/21/2012 1101   EOSABS 0.2 06/21/2012 1101   BASOSABS 0.0 06/21/2012 1101     BNP No results found for this basename: probnp    Lipid Panel     Component Value Date/Time   CHOL 140 06/21/2012 1101   TRIG 65 06/21/2012 1101   HDL 54 06/21/2012 1101   CHOLHDL 2.6 06/21/2012 1101   VLDL 13 06/21/2012 1101   LDLCALC 73 06/21/2012 1101     RADIOLOGY: No results found.   ASSESSMENT AND PLAN: My impression is that Mr. Yamil Oelke is a 33 year old gentleman with a 12 year history of diabetes mellitus, as well as a history of mild renal insufficiency, felt to be stage II, and hypertension. He also has a history of hyperlipidemia and on his last lipid study in April 2014 LDL cholesterol was 73. Creatinine was 1.83 in April 2014. He is on low-dose ARB therapy with losartan at 50 mg which should be beneficial in prolonging or delaying progressive renal insufficiency in this diabetic male. He is doing well and has a good exercise regimen. He'll be undergoing subsequent laboratory as already in future appointments with CBC, comprehensive metabolic panel, hemoglobin A1c, lipid panel, PSA, testosterone, and TSH level. I will review these when available. As long as he remains stable, I will see him in one year for cardiology reassessment.   Lennette Bihari, MD, [facility] 01/27/2013 9:59 AM

## 2013-01-29 ENCOUNTER — Encounter: Payer: Self-pay | Admitting: Cardiovascular Disease

## 2013-01-30 ENCOUNTER — Other Ambulatory Visit (INDEPENDENT_AMBULATORY_CARE_PROVIDER_SITE_OTHER): Payer: Medicare PPO

## 2013-01-30 DIAGNOSIS — Z23 Encounter for immunization: Secondary | ICD-10-CM

## 2013-01-30 DIAGNOSIS — Z125 Encounter for screening for malignant neoplasm of prostate: Secondary | ICD-10-CM

## 2013-01-30 DIAGNOSIS — E291 Testicular hypofunction: Secondary | ICD-10-CM

## 2013-01-30 DIAGNOSIS — E785 Hyperlipidemia, unspecified: Secondary | ICD-10-CM

## 2013-01-30 DIAGNOSIS — Z79899 Other long term (current) drug therapy: Secondary | ICD-10-CM

## 2013-01-30 DIAGNOSIS — E119 Type 2 diabetes mellitus without complications: Secondary | ICD-10-CM

## 2013-01-30 DIAGNOSIS — I10 Essential (primary) hypertension: Secondary | ICD-10-CM

## 2013-01-30 LAB — COMPREHENSIVE METABOLIC PANEL
ALT: 18 U/L (ref 0–53)
AST: 18 U/L (ref 0–37)
Albumin: 3.9 g/dL (ref 3.5–5.2)
Alkaline Phosphatase: 32 U/L — ABNORMAL LOW (ref 39–117)
BUN: 27 mg/dL — ABNORMAL HIGH (ref 6–23)
CO2: 27 mEq/L (ref 19–32)
Calcium: 9.1 mg/dL (ref 8.4–10.5)
Chloride: 101 mEq/L (ref 96–112)
Creat: 1.76 mg/dL — ABNORMAL HIGH (ref 0.50–1.35)
Glucose, Bld: 139 mg/dL — ABNORMAL HIGH (ref 70–99)
Potassium: 4.4 mEq/L (ref 3.5–5.3)
Sodium: 139 mEq/L (ref 135–145)
Total Bilirubin: 0.6 mg/dL (ref 0.3–1.2)
Total Protein: 6.7 g/dL (ref 6.0–8.3)

## 2013-01-30 LAB — LIPID PANEL
Cholesterol: 127 mg/dL (ref 0–200)
HDL: 45 mg/dL (ref >39)
LDL Cholesterol: 68 mg/dL (ref 0–99)
Total CHOL/HDL Ratio: 2.8 Ratio
Triglycerides: 70 mg/dL (ref <150)
VLDL: 14 mg/dL (ref 0–40)

## 2013-01-30 LAB — CBC WITH DIFFERENTIAL/PLATELET
Basophils Absolute: 0 10*3/uL (ref 0.0–0.1)
Basophils Relative: 1 % (ref 0–1)
Eosinophils Absolute: 0.2 10*3/uL (ref 0.0–0.7)
Eosinophils Relative: 2 % (ref 0–5)
HCT: 42.5 % (ref 39.0–52.0)
Hemoglobin: 14.1 g/dL (ref 13.0–17.0)
Lymphocytes Relative: 12 % (ref 12–46)
Lymphs Abs: 1 10*3/uL (ref 0.7–4.0)
MCH: 30.3 pg (ref 26.0–34.0)
MCHC: 33.2 g/dL (ref 30.0–36.0)
MCV: 91.2 fL (ref 78.0–100.0)
Monocytes Absolute: 0.7 10*3/uL (ref 0.1–1.0)
Monocytes Relative: 8 % (ref 3–12)
Neutro Abs: 6.3 10*3/uL (ref 1.7–7.7)
Neutrophils Relative %: 77 % (ref 43–77)
Platelets: 137 10*3/uL — ABNORMAL LOW (ref 150–400)
RBC: 4.66 MIL/uL (ref 4.22–5.81)
RDW: 15.8 % — ABNORMAL HIGH (ref 11.5–15.5)
WBC: 8.1 10*3/uL (ref 4.0–10.5)

## 2013-01-30 LAB — HEMOGLOBIN A1C
Hgb A1c MFr Bld: 6.8 % — ABNORMAL HIGH (ref <5.7)
Mean Plasma Glucose: 148 mg/dL — ABNORMAL HIGH (ref <117)

## 2013-01-30 NOTE — Addendum Note (Signed)
Addended by: Leretha Dykes L on: 01/30/2013 11:29 AM   Modules accepted: Orders

## 2013-01-31 LAB — PSA, MEDICARE: PSA: 2.69 ng/mL (ref <=4.00)

## 2013-01-31 LAB — TSH: TSH: 2.021 u[IU]/mL (ref 0.350–4.500)

## 2013-01-31 LAB — TESTOSTERONE: Testosterone: 676 ng/dL (ref 300–890)

## 2013-02-02 ENCOUNTER — Other Ambulatory Visit: Payer: Self-pay | Admitting: Family Medicine

## 2013-02-12 ENCOUNTER — Other Ambulatory Visit: Payer: Self-pay | Admitting: Family Medicine

## 2013-02-15 ENCOUNTER — Ambulatory Visit (INDEPENDENT_AMBULATORY_CARE_PROVIDER_SITE_OTHER): Payer: Medicare PPO | Admitting: Family Medicine

## 2013-02-15 ENCOUNTER — Encounter: Payer: Self-pay | Admitting: Family Medicine

## 2013-02-15 VITALS — BP 160/64 | HR 80 | Ht 76.0 in | Wt 258.0 lb

## 2013-02-15 DIAGNOSIS — E785 Hyperlipidemia, unspecified: Secondary | ICD-10-CM

## 2013-02-15 DIAGNOSIS — Z0000 Encounter for general adult medical examination without abnormal findings: Secondary | ICD-10-CM

## 2013-02-15 DIAGNOSIS — E669 Obesity, unspecified: Secondary | ICD-10-CM

## 2013-02-15 DIAGNOSIS — I10 Essential (primary) hypertension: Secondary | ICD-10-CM

## 2013-02-15 DIAGNOSIS — E291 Testicular hypofunction: Secondary | ICD-10-CM

## 2013-02-15 DIAGNOSIS — E119 Type 2 diabetes mellitus without complications: Secondary | ICD-10-CM

## 2013-02-15 DIAGNOSIS — E1121 Type 2 diabetes mellitus with diabetic nephropathy: Secondary | ICD-10-CM | POA: Insufficient documentation

## 2013-02-15 DIAGNOSIS — N058 Unspecified nephritic syndrome with other morphologic changes: Secondary | ICD-10-CM

## 2013-02-15 DIAGNOSIS — Z125 Encounter for screening for malignant neoplasm of prostate: Secondary | ICD-10-CM

## 2013-02-15 DIAGNOSIS — E1129 Type 2 diabetes mellitus with other diabetic kidney complication: Secondary | ICD-10-CM

## 2013-02-15 DIAGNOSIS — N182 Chronic kidney disease, stage 2 (mild): Secondary | ICD-10-CM

## 2013-02-15 DIAGNOSIS — Z79899 Other long term (current) drug therapy: Secondary | ICD-10-CM

## 2013-02-15 LAB — POCT URINALYSIS DIPSTICK
Bilirubin, UA: NEGATIVE
Blood, UA: NEGATIVE
Glucose, UA: NEGATIVE
Ketones, UA: NEGATIVE
Leukocytes, UA: NEGATIVE
Nitrite, UA: NEGATIVE
Protein, UA: NEGATIVE
Spec Grav, UA: 1.01
Urobilinogen, UA: NEGATIVE
pH, UA: 7

## 2013-02-15 MED ORDER — SIMVASTATIN 20 MG PO TABS: ORAL_TABLET | ORAL | Status: DC

## 2013-02-15 MED ORDER — HYDROCHLOROTHIAZIDE 25 MG PO TABS: ORAL_TABLET | ORAL | Status: DC

## 2013-02-15 MED ORDER — GLIPIZIDE 5 MG PO TABS: 5.0000 mg | ORAL_TABLET | Freq: Every day | ORAL | Status: DC

## 2013-02-15 MED ORDER — PIOGLITAZONE HCL 45 MG PO TABS: ORAL_TABLET | ORAL | Status: DC

## 2013-02-15 MED ORDER — SAXAGLIPTIN HCL 2.5 MG PO TABS: ORAL_TABLET | ORAL | Status: DC

## 2013-02-15 NOTE — Progress Notes (Signed)
Chief Complaint  Patient presents with  . Annual Exam    nonfasting CPE/med check. Did not eye exam as he just saw Dr.Tanner.   James Glover is a 67 y.o. male who presents for a complete physical.  He has the following concerns:  Diabetes follow-up:  Blood sugars after exercising for 2 hrs are 54-109 within 30 minutes of exercise.  Frequently <70. He only checked twice prior to eating, and it was 69, 75.  Doesn't check it prior to bed (or after dinner).  Denies hypoglycemia.  Denies polydipsia and polyuria.  Last eye exam was September/October.  Patient follows a low sugar diet and checks feet regularly without concerns.  His appetite is very good--exercising makes him hungry.  Admits to recently eating hot dogs. Given pt's renal function, pharmacy recommended changing from glyburide to glipizide (see note from 11/2012)  Hypertension follow-up:  Blood pressures after exercising 97-119/57-72.  Prior to exercise is also good, 102-127/62-72.  Denies dizziness, headaches, chest pain.  Denies side effects of medications.  Hyperlipidemia follow-up:  Patient is reportedly following a low-fat, low cholesterol diet.  Compliant with medications and denies medication side effects  Hypogonadism: compliant with testosterone replacement.  Still has some diminished libido.  Energy is great.  Levitra is effective in treating ED.  Immunization History  Administered Date(s) Administered  . DTaP 07/25/2008  . Influenza Split 11/17/2011  . Influenza, High Dose Seasonal PF 01/30/2013  . Pneumococcal Polysaccharide-23 11/29/2009  . Tdap 07/25/2008  has not had shingles vaccine Last colonoscopy: 05/05/10  Last PSA: 01/2013, normal  Dentist: goes 2-3 times per year, getting implant Ophtho: September/October 2014  Exercise: walking 8 miles daily on treadmill (2 hrs, and lifts weight daily--including all body parts every day, not alternating or resting).  End of life discussion: Doesn't have POA or living will.   Got paperwork last year, but doesn't recall ever filling out.   OTHER DOCTORS INVOLVED IN PATIENT'S CARE:  Dentist: Dr. Renard Hamper Ophtho: Dr. Burgess Estelle ([place] ophtho) GI: Dr. Loreta Ave  Nephro: Dr. Arrie Aran  Cardiologist: Dr. Tresa Endo Dermatologist: Dr. Danella Deis   ROS: The patient denies anorexia, fever, weight changes, headaches, vision loss, decreased hearing, ear pain, hoarseness, chest pain, palpitations, dizziness, syncope, dyspnea on exertion, cough, swelling, nausea, vomiting, diarrhea, constipation, abdominal pain, melena, hematochezia, indigestion/heartburn, hematuria, incontinence, weakened urine stream, dysuria, genital lesions, joint pains, numbness, tingling, weakness, tremor, suspicious skin lesions, depression, anxiety, abnormal bleeding/bruising, or enlarged lymph nodes.  Up 2 times/night to void. No urinary hesitancy. Dry skin on upper thighs in the winter time  PHYSICAL EXAM: BP 168/100  Pulse 80  Ht 6\' 4"  (1.93 m)  Wt 258 lb (117.028 kg)  BMI 31.42 kg/m2 160/64 on repeat by MD  General Appearance:  Alert, cooperative, no distress, appears stated age   Head:  Normocephalic, without obvious abnormality, atraumatic   Eyes:  PERRL, conjunctiva/corneas clear, EOM's intact, fundi  benign   Ears:  Normal TM's and external ear canals   Nose:  Nares normal, mucosa normal, no drainage or sinus tenderness   Throat:  Lips, mucosa, and tongue normal; teeth and gums normal   Neck:  Supple, no lymphadenopathy; thyroid: no enlargement/tenderness/nodules; no carotid  bruit or JVD   Back:  Spine nontender, no curvature, ROM normal, no CVA tenderness   Lungs:  Clear to auscultation bilaterally without wheezes, rales or ronchi; respirations unlabored   Chest Wall:  No tenderness or deformity   Heart:  Regular rate and rhythm, S1 and S2 normal,  no murmur, rub  or gallop   Breast Exam:  No chest wall tenderness, masses or gynecomastia   Abdomen:  Soft, non-tender, nondistended, normoactive bowel  sounds,  no masses, no hepatosplenomegaly. Mild ventral hernia, nontender.   Genitalia:  Normal male external genitalia without lesions. +penile piercing. Testicles without masses. No inguinal hernias.   Rectal:  Normal sphincter tone, no masses or tenderness; guaiac negative stool. Prostate smooth, no nodules, not enlarged.   Extremities:  No clubbing, cyanosis; trace pretibial edema  Pulses:  2+ and symmetric all extremities   Skin:  Skin texture, turgor normal, no rashes or lesions. Normal diabetic foot exam   Lymph nodes:  Cervical, supraclavicular, and axillary nodes normal   Neurologic:  CNII-XII intact, normal strength, sensation and gait; reflexes 2+ and symmetric throughout          Psych: Normal mood, affect, hygiene and grooming.    Lab Results  Component Value Date   HGBA1C 6.8* 01/30/2013     Chemistry      Component Value Date/Time   NA 139 01/30/2013 0901   K 4.4 01/30/2013 0901   CL 101 01/30/2013 0901   CO2 27 01/30/2013 0901   BUN 27* 01/30/2013 0901   CREATININE 1.76* 01/30/2013 0901      Component Value Date/Time   CALCIUM 9.1 01/30/2013 0901   ALKPHOS 32* 01/30/2013 0901   AST 18 01/30/2013 0901   ALT 18 01/30/2013 0901   BILITOT 0.6 01/30/2013 0901     Glu 139  Lab Results  Component Value Date   CHOL 127 01/30/2013   HDL 45 01/30/2013   LDLCALC 68 01/30/2013   TRIG 70 01/30/2013   CHOLHDL 2.8 01/30/2013   Lab Results  Component Value Date   WBC 8.1 01/30/2013   HGB 14.1 01/30/2013   HCT 42.5 01/30/2013   MCV 91.2 01/30/2013   PLT 137* 01/30/2013   Lab Results  Component Value Date   TSH 2.021 01/30/2013   Lab Results  Component Value Date   TESTOSTERONE 676 01/30/2013   Lab Results  Component Value Date   PSA 2.69 01/30/2013   PSA 3.03 06/21/2012   PSA 3.06 12/01/2011    ASSESSMENT/PLAN:  Routine general medical examination at a health care facility - Plan: POCT Urinalysis Dipstick  Type II or unspecified type diabetes mellitus without mention of  complication, not stated as uncontrolled - controlled, although A1c is higher.  some low sugars in am and after exercise; suspect higher in evenings.  stop am dose of glyburide, change to glipizide - Plan: pioglitazone (ACTOS) 45 MG tablet, saxagliptin HCl (ONGLYZA) 2.5 MG TABS tablet, glipiZIDE (GLUCOTROL) 5 MG tablet  Essential hypertension, benign - elevated today; normal after exercise. no changes in meds at this time - Plan: hydrochlorothiazide (HYDRODIURIL) 25 MG tablet  Male hypogonadism - well controlled.  continue current dose  Dyslipidemia - controlled - Plan: simvastatin (ZOCOR) 20 MG tablet  Chronic renal insufficiency, stage II (mild) - stable, f/b renal  Obesity (BMI 30.0-34.9) - weight loss encouraged.  getting plenty of exercise--needs to make smart food choices, portion control.   Type II diabetes mellitus with nephropathy - Plan: [facility] Diabetes Foot Exam  Reviewed risks/benefits of shingles vaccine. Advised to check with his insurance, and get from pharmacy.  HTN--elevated today.  Reviewed low sodium diet. Continue to monitor.  Continue current meds.  Discussed PSA screening (risks/benefits), recommended at least 30 minutes of aerobic activity at least 5 days/week; proper sunscreen use reviewed; healthy  diet and alcohol recommendations (less than or equal to 2 drinks/day) reviewed; regular seatbelt use; changing batteries in smoke detectors. Self-testicular exams. Immunization recommendations discussed--zostavax recommended.  Colonoscopy recommendations reviewed, UTD  Reviewed living will and healthcare power of attorney info again--he has the paperwork at home. Encouraged to fill out  Return in 4 weeks to f/u on sugars, after changing from glyburide to glipizide, and eliminating breakfast dose due to hypoglycemia.  Return in 6 months for med check+/AWV, with fasting labs prior

## 2013-02-15 NOTE — Patient Instructions (Signed)
  HEALTH MAINTENANCE RECOMMENDATIONS:  It is recommended that you get at least 30 minutes of aerobic exercise at least 5 days/week (for weight loss, you may need as much as 60-90 minutes). This can be any activity that gets your heart rate up. This can be divided in 10-15 minute intervals if needed, but try and build up your endurance at least once a week.  Weight bearing exercise is also recommended twice weekly.  Eat a healthy diet with lots of vegetables, fruits and fiber.  "Colorful" foods have a lot of vitamins (ie green vegetables, tomatoes, red peppers, etc).  Limit sweet tea, regular sodas and alcoholic beverages, all of which has a lot of calories and sugar.  Up to 2 alcoholic drinks daily may be beneficial for men (unless trying to lose weight, watch sugars).  Drink a lot of water.  Sunscreen of at least SPF 30 should be used on all sun-exposed parts of the skin when outside between the hours of 10 am and 4 pm (not just when at beach or pool, but even with exercise, golf, tennis, and yard work!)  Use a sunscreen that says "broad spectrum" so it covers both UVA and UVB rays, and make sure to reapply every 1-2 hours.  Remember to change the batteries in your smoke detectors when changing your clock times in the spring and fall.  Use your seat belt every time you are in a car, and please drive safely and not be distracted with cell phones and texting while driving.  Please check with your insurance regarding the shingles vaccine (zostavax).  You will need to get this from the pharmacy.  Remember not to take this within a month of any other vaccine.  Please look at the living will and healthcare power of attorney paperwork that you were given in the past.  Call us if you need more (ie if you can't locate those papers).

## 2013-03-21 ENCOUNTER — Ambulatory Visit (INDEPENDENT_AMBULATORY_CARE_PROVIDER_SITE_OTHER): Payer: Medicare PPO | Admitting: Family Medicine

## 2013-03-21 ENCOUNTER — Encounter: Payer: Self-pay | Admitting: Family Medicine

## 2013-03-21 VITALS — BP 144/66 | HR 72 | Ht 76.0 in | Wt 258.0 lb

## 2013-03-21 DIAGNOSIS — E1129 Type 2 diabetes mellitus with other diabetic kidney complication: Secondary | ICD-10-CM

## 2013-03-21 DIAGNOSIS — E1121 Type 2 diabetes mellitus with diabetic nephropathy: Secondary | ICD-10-CM

## 2013-03-21 DIAGNOSIS — I10 Essential (primary) hypertension: Secondary | ICD-10-CM

## 2013-03-21 DIAGNOSIS — N058 Unspecified nephritic syndrome with other morphologic changes: Secondary | ICD-10-CM

## 2013-03-21 DIAGNOSIS — E119 Type 2 diabetes mellitus without complications: Secondary | ICD-10-CM

## 2013-03-21 NOTE — Patient Instructions (Signed)
Continue current medications. Periodically check your blood pressures at other times of the day, not just after exercise.  Return sooner than June if you are having frequent low blood sugars, symptomatic low blood pressures (ie dizzy), or if sugars dramatically increase (consistently >150 fasting, or >180 in the evenings)

## 2013-03-21 NOTE — Progress Notes (Signed)
Chief Complaint  Patient presents with  . Follow-up    follow up on blood sugars, nonfasting.    Patient presents for follow up on diabetes.  He was changed from glyburide to glipizide (due to renal function and pharmacy recommendation).  Dose was also lowered, eliminating the morning dose due to some low blood sugars in the morning and after exercise.  Denies any side effects of medication.  He reports that he has seen an increase in blood sugars since last visit. Fasting blood sugars are running 100-141 (mostly 120-low 130's).  2-3 hours after dinner sugars are 143-162.  Checked twice before dinner, and was 80, 94. Denies polydipsia, polyuria, hypoglycemia, blurred vision.  He was also asked to check his BP's at other times, not just after exercise.  He still has only been checking immediately after exercise, and BP's have been 90-124/56-66. He denies any dizziness.   Denies headaches, chest pain, swelling.  Past Medical History  Diagnosis Date  . Diabetes mellitus   . Hyperlipidemia   . Hypertension   . GERD (gastroesophageal reflux disease)   . Hemorrhoids     internal and external  . Diverticulosis   . Hay fever   . Hypogonadism male   . Erectile dysfunction    Past Surgical History  Procedure Laterality Date  . Circumcision  age 68  . Dental surgery      implants   History   Social History  . Marital Status: Married    Spouse Name: N/A    Number of Children: 0  . Years of Education: N/A   Occupational History  . 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 Smoking status: Never Smoker   . Smokeless tobacco: Never Used  . Alcohol Use: Yes     Comment: glass of wine once a week  . Drug Use: No  . Sexual Activity: Not on file   Other Topics Concern  . Not on file   Social History Narrative   Retired 06/2011.  Lives with wife, 1 cat    Current outpatient prescriptions:aspirin 81 MG tablet, Take 81 mg by mouth daily.  , Disp: ,  Rfl: ;  glipiZIDE (GLUCOTROL) 5 MG tablet, Take 1 tablet (5 mg total) by mouth daily before supper., Disp: 30 tablet, Rfl: 1;  hydrochlorothiazide (HYDRODIURIL) 25 MG tablet, TAKE 1 TABLET BY MOUTH EVERY DAY, Disp: 30 tablet, Rfl: 5;  Lancets (FREESTYLE) lancets, Test daily, Disp: 100 each, Rfl: prn losartan (COZAAR) 50 MG tablet, TAKE 1 TABLET BY MOUTH EVERY DAY, Disp: 30 tablet, Rfl: 5;  Omega-3 Fatty Acids (FISH OIL) 1000 MG CAPS, Take 2 capsules by mouth daily., Disp: , Rfl: ;  pioglitazone (ACTOS) 45 MG tablet, TAKE 1 TABLET BY MOUTH EVERY DAY, Disp: 30 tablet, Rfl: 5;  saxagliptin HCl (ONGLYZA) 2.5 MG TABS tablet, TAKE 1 TABLET BY MOUTH EVERY DAY, Disp: 30 tablet, Rfl: 5 simvastatin (ZOCOR) 20 MG tablet, TAKE 1 TABLET BY MOUTH AT BEDTIME, Disp: 30 tablet, Rfl: 5;  Testosterone (ANDROGEL PUMP) 12.5 MG/ACT (1%) GEL, 2 pumps daily, Disp: , Rfl: ;  vardenafil (LEVITRA) 20 MG tablet, Take 1 tablet (20 mg total) by mouth daily as needed for erectile dysfunction., Disp: 15 tablet, Rfl: 10  ROS:  Denies fevers, chills, headaches, dizziness, URI symptoms ,cough, shortness of breath, nausea, bowel changes, bleeding/bruising, edema, depression, or any other complaints/concerns.  PHYSICAL EXAM: BP 150/80  Pulse 72  Ht 6\' 4"  (1.93 m)  Wt  258 lb (117.028 kg)  BMI 31.42 kg/m2 144/66 on RA by MD Well developed, pleasant obese male in no distress Neck: no lymphadenopathy or mass Heart: regular rate and rhythm Lungs: clear bilaterally Extremities: no edema Psych: normal mood, affect Neuro: alert and oriented. Normal strength, gait  ASSESSMENT/PLAN:  Type II or unspecified type diabetes mellitus without mention of complication, not stated as uncontrolled  Type II diabetes mellitus with nephropathy  HTN (hypertension)  Although sugars are higher than when on BID glyburide, they are still reasonable, and A1c should likely still be <7.  I feel that these slightly higher fasting sugars outweigh the risk  of hypoglycemia from Glipizide. Therefore, continue current doses of medication.  Continue to monitor both fasting sugars and 2 hrs post prandially (periodically).  Weight loss is encouraged.  HTN--elevated today.  Continue regular exercise, low sodium diet.  Encouraged him to check BP's at other times of day periodically, not only immediately after exercise  F/u in June with labs prior.

## 2013-04-24 ENCOUNTER — Other Ambulatory Visit: Payer: Self-pay | Admitting: Family Medicine

## 2013-05-07 ENCOUNTER — Other Ambulatory Visit: Payer: Self-pay | Admitting: Family Medicine

## 2013-07-06 ENCOUNTER — Other Ambulatory Visit: Payer: Self-pay | Admitting: Family Medicine

## 2013-07-29 ENCOUNTER — Other Ambulatory Visit: Payer: Self-pay | Admitting: Family Medicine

## 2013-08-03 ENCOUNTER — Other Ambulatory Visit: Payer: Self-pay | Admitting: Family Medicine

## 2013-08-29 ENCOUNTER — Other Ambulatory Visit: Payer: Self-pay | Admitting: Family Medicine

## 2013-09-03 ENCOUNTER — Other Ambulatory Visit: Payer: Self-pay | Admitting: Family Medicine

## 2013-09-03 ENCOUNTER — Encounter: Payer: Self-pay | Admitting: Family Medicine

## 2013-09-03 ENCOUNTER — Ambulatory Visit (INDEPENDENT_AMBULATORY_CARE_PROVIDER_SITE_OTHER): Payer: Medicare PPO | Admitting: Family Medicine

## 2013-09-03 ENCOUNTER — Telehealth: Payer: Self-pay | Admitting: Family Medicine

## 2013-09-03 VITALS — BP 120/70 | HR 60 | Ht 76.0 in | Wt 253.0 lb

## 2013-09-03 DIAGNOSIS — E119 Type 2 diabetes mellitus without complications: Secondary | ICD-10-CM

## 2013-09-03 DIAGNOSIS — E1121 Type 2 diabetes mellitus with diabetic nephropathy: Secondary | ICD-10-CM

## 2013-09-03 DIAGNOSIS — N058 Unspecified nephritic syndrome with other morphologic changes: Secondary | ICD-10-CM

## 2013-09-03 DIAGNOSIS — I10 Essential (primary) hypertension: Secondary | ICD-10-CM

## 2013-09-03 DIAGNOSIS — E291 Testicular hypofunction: Secondary | ICD-10-CM

## 2013-09-03 DIAGNOSIS — E785 Hyperlipidemia, unspecified: Secondary | ICD-10-CM

## 2013-09-03 DIAGNOSIS — E1129 Type 2 diabetes mellitus with other diabetic kidney complication: Secondary | ICD-10-CM

## 2013-09-03 LAB — POCT GLYCOSYLATED HEMOGLOBIN (HGB A1C): Hemoglobin A1C: 6.5

## 2013-09-03 MED ORDER — SIMVASTATIN 20 MG PO TABS: ORAL_TABLET | ORAL | Status: DC

## 2013-09-03 MED ORDER — TESTOSTERONE 12.5 MG/ACT (1%) TD GEL: TRANSDERMAL | Status: DC

## 2013-09-03 MED ORDER — PIOGLITAZONE HCL 45 MG PO TABS: ORAL_TABLET | ORAL | Status: DC

## 2013-09-03 MED ORDER — HYDROCHLOROTHIAZIDE 25 MG PO TABS: ORAL_TABLET | ORAL | Status: DC

## 2013-09-03 MED ORDER — SAXAGLIPTIN HCL 2.5 MG PO TABS: ORAL_TABLET | ORAL | Status: DC

## 2013-09-03 NOTE — Telephone Encounter (Signed)
Spoke with pharmacist and clarified.

## 2013-09-03 NOTE — Progress Notes (Signed)
Chief Complaint  Patient presents with  . Diabetes    fasting med check. Will do A1C at visit today.    Patient presents for follow up on diabetes. He was changed from glyburide to glipizide (due to renal function and pharmacy recommendation). Dose was also lowered, eliminating the morning dose due to some low blood sugars in the morning and after exercise. He has been on this dose since prior to his last visit 6 months ago, and he denies any side effects or hypoglycemia. Fasting blood sugars are running 86-119, although yesterday was 146 (list was over the last 5 weeks). 2-3 hours after dinner sugars are 85-131 (once was 150 when he forgot to take his medication). Denies polydipsia, polyuria, hypoglycemia, blurred vision.  Last eye exam was 12/2012.  He checks his feet regularly and denies concerns.  Hypertension follow-up:  He has been checking his blood pressures at different times of the day (previously had been just checking after exercise).  In the mornings they are running 102-121/55-67, and evenings are 89/55 (after exercise) up to 138/66, mostly running 110-120/55-60. He denies any dizziness. Denies headaches, chest pain, swelling.   Hyperlipidemia follow-up:  Patient is reportedly following a low-fat, low cholesterol diet.  Compliant with medications and denies medication side effects  ED:  Uses Levitra as needed, and does get some results.  Hypogonadism:  He ran out of Androgel about a month ago--hasn't noticed any difference since he ran out.  He reports that his energy is good, and he never had any side effects when he was taking it.  He was supposed to have labs done prior to his visit, but did not.  He has now been out of his testosterone for a little over a month.  Down another 5 pounds since his last visit.  Past Medical History  Diagnosis Date  . Diabetes mellitus   . Hyperlipidemia   . Hypertension   . GERD (gastroesophageal reflux disease)   . Hemorrhoids     internal  and external  . Diverticulosis   . Hay fever   . Hypogonadism male   . Erectile dysfunction    Past Surgical History  Procedure Laterality Date  . Circumcision  age 13  . Dental surgery      implants   History   Social History  . Marital Status: Married    Spouse Name: N/A    Number of Children: 0  . Years of Education: N/A   Occupational History  . 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 Smoking status: Never Smoker   . Smokeless tobacco: Never Used  . Alcohol Use: Yes     Comment: glass of wine once a week  . Drug Use: No  . Sexual Activity: Not on file   Other Topics Concern  . Not on file   Social History Narrative   Retired 06/2011.  Lives with wife, 1 cat   Outpatient Encounter Prescriptions as of 09/03/2013  Medication Sig Note  . aspirin 81 MG tablet Take 81 mg by mouth daily.     Marland Kitchen glipiZIDE (GLUCOTROL) 5 MG tablet TAKE 1 TABLET BY MOUTH EVERY DAY BEFORE SUPPER   . hydrochlorothiazide (HYDRODIURIL) 25 MG tablet TAKE 1 TABLET BY MOUTH EVERY DAY   . Lancets (FREESTYLE) lancets Test daily   . losartan (COZAAR) 50 MG tablet TAKE 1 TABLET BY MOUTH EVERY DAY   . Omega-3 Fatty Acids (FISH OIL) 1000 MG CAPS Take 2  capsules by mouth daily.   . pioglitazone (ACTOS) 45 MG tablet TAKE 1 TABLET BY MOUTH EVERY DAY   . saxagliptin HCl (ONGLYZA) 2.5 MG TABS tablet TAKE 1 TABLET BY MOUTH EVERY DAY   . simvastatin (ZOCOR) 20 MG tablet TAKE 1 TABLET BY MOUTH AT BEDTIME   . Testosterone (ANDROGEL PUMP) 12.5 MG/ACT (1%) GEL 2 pumps daily 09/03/2013: Ran out a month ago  . vardenafil (LEVITRA) 20 MG tablet Take 1 tablet (20 mg total) by mouth daily as needed for erectile dysfunction. 09/03/2013: Uses prn   Allergies  Allergen Reactions  . Ace Inhibitors     cough    ROS: denies fevers, chills, URI symptoms, headaches, dizziness, chest pain, shortness of breath, GI or GU complaints, bleeding, bruising, rash, edema, depression, joint pains or  any other concerns.  See HPI  PHYSICAL EXAM: BP 120/70  Pulse 60  Ht 6\' 4"  (1.93 m)  Wt 253 lb (114.76 kg)  BMI 30.81 kg/m2  Well developed, pleasant male in no distress Neck: no lymphadenopathy or mass  Heart: regular rate and rhythm  Lungs: clear bilaterally  Abdomen: soft, nontender, no organomegaly or mass Psych: normal mood, affect  Neuro: alert and oriented. Normal strength, gait Extremities: no edema. Mild onychomycosis of toenails.  2+ pulses. Normal sensation  Lab Results  Component Value Date   HGBA1C 6.5 09/03/2013   ASSESSMENT/PLAN:  Type II or unspecified type diabetes mellitus without mention of complication, not stated as uncontrolled - Plan: HgB A1c, pioglitazone (ACTOS) 45 MG tablet, saxagliptin HCl (ONGLYZA) 2.5 MG TABS tablet, HM Diabetes Foot Exam  Essential hypertension, benign - well controlled - Plan: hydrochlorothiazide (HYDRODIURIL) 25 MG tablet  Type II diabetes mellitus with nephropathy - well controlled.  no hypoglycemia; continue current meds.  Would like to try and taper off glipizide in future, if able  Dyslipidemia - controlled - Plan: simvastatin (ZOCOR) 20 MG tablet  Male hypogonadism - restart Androgel, and return in 4 weeks for labs. - Plan: DISCONTINUED: Testosterone (ANDROGEL PUMP) 12.5 MG/ACT (1%) GEL   Hold off on doing the labs that were ordered, since has been out of his testosterone for 4-6 weeks.  Instead, schedule lab visit for a month.  F/u in 6-7 months for CPE/med check

## 2013-09-03 NOTE — Telephone Encounter (Signed)
Written rx earlier today.  He reports that his dose is 2 pumps daily.  Not sure what is proper quantity for 1 month supply.  Also, refills are likely allowable at just 2 (I put 5 on rx in error).

## 2013-09-03 NOTE — Patient Instructions (Signed)
Continue your current medications. Continue exercise, weight loss, and monitoring your sugars and blood pressure.  Contact us if you are having low blood sugars (<70) or dizziness associated with some of your lower blood pressures.  Make sure to stay well hydrated when you exercise.  Schedule your yearly eye exam, and have them send [place] copies of the report after you go.  Return in 1 month for fasting labs, and in 6-7 months for your physical.

## 2013-09-04 ENCOUNTER — Other Ambulatory Visit: Payer: Self-pay | Admitting: Internal Medicine

## 2013-09-04 MED ORDER — TESTOSTERONE 20.25 MG/1.25GM (1.62%) TD GEL: TRANSDERMAL | Status: DC

## 2013-09-04 NOTE — Telephone Encounter (Signed)
Faxed back the subsitiution med androgel 1.62% apply 2 pumps qam. #30 with 2 refills to [phone] [facility] [address]

## 2013-09-04 NOTE — Telephone Encounter (Signed)
If Androgel 1% is no longer available, then change to the 1.62% gel--apply 2 pumps (40.5 mg) qAM.  Disp #30 day supply with 2 refills.  I don't know if you will be able to call/fax the clarification /change to them (pt originally was handed printed rx).  I pended the order--please call/fax. Thanks

## 2013-09-04 NOTE — Telephone Encounter (Signed)
Fax came through from [facility] stating that manuf d/c medication androgel 1% prescribed. Please substitute to a different dose, or strength and send back with approval along with strength,directions,quantity and refills. [facility] [address] [place]

## 2013-09-06 ENCOUNTER — Encounter: Payer: Self-pay | Admitting: Internal Medicine

## 2013-09-28 ENCOUNTER — Other Ambulatory Visit: Payer: Self-pay | Admitting: Family Medicine

## 2013-10-04 ENCOUNTER — Other Ambulatory Visit: Payer: Medicare PPO

## 2013-10-04 DIAGNOSIS — E119 Type 2 diabetes mellitus without complications: Secondary | ICD-10-CM

## 2013-10-04 DIAGNOSIS — Z125 Encounter for screening for malignant neoplasm of prostate: Secondary | ICD-10-CM

## 2013-10-04 DIAGNOSIS — Z79899 Other long term (current) drug therapy: Secondary | ICD-10-CM

## 2013-10-04 DIAGNOSIS — E291 Testicular hypofunction: Secondary | ICD-10-CM

## 2013-10-04 LAB — CBC WITH DIFFERENTIAL/PLATELET
Basophils Absolute: 0 10*3/uL (ref 0.0–0.1)
Basophils Relative: 0 % (ref 0–1)
Eosinophils Absolute: 0.2 10*3/uL (ref 0.0–0.7)
Eosinophils Relative: 3 % (ref 0–5)
HCT: 41 % (ref 39.0–52.0)
Hemoglobin: 13.8 g/dL (ref 13.0–17.0)
Lymphocytes Relative: 16 % (ref 12–46)
Lymphs Abs: 0.8 10*3/uL (ref 0.7–4.0)
MCH: 31.5 pg (ref 26.0–34.0)
MCHC: 33.7 g/dL (ref 30.0–36.0)
MCV: 93.6 fL (ref 78.0–100.0)
Monocytes Absolute: 0.6 10*3/uL (ref 0.1–1.0)
Monocytes Relative: 11 % (ref 3–12)
Neutro Abs: 3.5 10*3/uL (ref 1.7–7.7)
Neutrophils Relative %: 70 % (ref 43–77)
Platelets: 125 10*3/uL — ABNORMAL LOW (ref 150–400)
RBC: 4.38 MIL/uL (ref 4.22–5.81)
RDW: 14.5 % (ref 11.5–15.5)
WBC: 5 10*3/uL (ref 4.0–10.5)

## 2013-10-05 LAB — HEPATIC FUNCTION PANEL
ALT: 11 U/L (ref 0–53)
AST: 15 U/L (ref 0–37)
Albumin: 4.1 g/dL (ref 3.5–5.2)
Alkaline Phosphatase: 33 U/L — ABNORMAL LOW (ref 39–117)
Bilirubin, Direct: 0.2 mg/dL (ref 0.0–0.3)
Indirect Bilirubin: 0.5 mg/dL (ref 0.2–1.2)
Total Bilirubin: 0.7 mg/dL (ref 0.2–1.2)
Total Protein: 6.4 g/dL (ref 6.0–8.3)

## 2013-10-05 LAB — GLUCOSE, RANDOM: Glucose, Bld: 109 mg/dL — ABNORMAL HIGH (ref 70–99)

## 2013-10-05 LAB — TESTOSTERONE: Testosterone: 1128 ng/dL — ABNORMAL HIGH (ref 300–890)

## 2013-10-05 LAB — PSA, MEDICARE: PSA: 2.88 ng/mL (ref <=4.00)

## 2013-10-06 ENCOUNTER — Other Ambulatory Visit: Payer: Self-pay | Admitting: Family Medicine

## 2013-10-08 ENCOUNTER — Other Ambulatory Visit: Payer: Self-pay | Admitting: *Deleted

## 2013-10-08 DIAGNOSIS — E119 Type 2 diabetes mellitus without complications: Secondary | ICD-10-CM

## 2013-10-08 DIAGNOSIS — Z79899 Other long term (current) drug therapy: Secondary | ICD-10-CM

## 2013-10-29 ENCOUNTER — Other Ambulatory Visit: Payer: Self-pay | Admitting: Family Medicine

## 2013-10-30 ENCOUNTER — Other Ambulatory Visit: Payer: Self-pay | Admitting: Family Medicine

## 2013-12-05 ENCOUNTER — Other Ambulatory Visit: Payer: Self-pay | Admitting: Family Medicine

## 2013-12-14 ENCOUNTER — Other Ambulatory Visit: Payer: Self-pay

## 2014-02-06 ENCOUNTER — Other Ambulatory Visit: Payer: Self-pay | Admitting: Family Medicine

## 2014-02-11 ENCOUNTER — Other Ambulatory Visit: Payer: Self-pay | Admitting: *Deleted

## 2014-02-11 ENCOUNTER — Telehealth: Payer: Self-pay | Admitting: Family Medicine

## 2014-02-11 MED ORDER — GLUCOSE BLOOD VI STRP: ORAL_STRIP | Status: DC

## 2014-02-11 NOTE — Telephone Encounter (Signed)
Done

## 2014-02-11 NOTE — Telephone Encounter (Signed)
Pt is calling for refill of Free Style strips to [facility] [phone]

## 2014-02-25 ENCOUNTER — Other Ambulatory Visit: Payer: Medicare PPO

## 2014-02-25 DIAGNOSIS — E119 Type 2 diabetes mellitus without complications: Secondary | ICD-10-CM

## 2014-02-25 DIAGNOSIS — Z79899 Other long term (current) drug therapy: Secondary | ICD-10-CM

## 2014-02-26 LAB — COMPREHENSIVE METABOLIC PANEL
ALT: 18 U/L (ref 0–53)
AST: 20 U/L (ref 0–37)
Albumin: 4 g/dL (ref 3.5–5.2)
Alkaline Phosphatase: 36 U/L — ABNORMAL LOW (ref 39–117)
BUN: 29 mg/dL — ABNORMAL HIGH (ref 6–23)
CO2: 25 mEq/L (ref 19–32)
Calcium: 9.2 mg/dL (ref 8.4–10.5)
Chloride: 100 mEq/L (ref 96–112)
Creat: 2.04 mg/dL — ABNORMAL HIGH (ref 0.50–1.35)
Glucose, Bld: 167 mg/dL — ABNORMAL HIGH (ref 70–99)
Potassium: 4.4 mEq/L (ref 3.5–5.3)
Sodium: 136 mEq/L (ref 135–145)
Total Bilirubin: 0.6 mg/dL (ref 0.2–1.2)
Total Protein: 6.3 g/dL (ref 6.0–8.3)

## 2014-02-26 LAB — LIPID PANEL
Cholesterol: 153 mg/dL (ref 0–200)
HDL: 50 mg/dL (ref >39)
LDL Cholesterol: 91 mg/dL (ref 0–99)
Total CHOL/HDL Ratio: 3.1 Ratio
Triglycerides: 58 mg/dL (ref <150)
VLDL: 12 mg/dL (ref 0–40)

## 2014-02-26 LAB — HEMOGLOBIN A1C
Hgb A1c MFr Bld: 7.4 % — ABNORMAL HIGH (ref <5.7)
Mean Plasma Glucose: 166 mg/dL — ABNORMAL HIGH (ref <117)

## 2014-02-26 LAB — TESTOSTERONE: Testosterone: 376 ng/dL (ref 300–890)

## 2014-02-26 LAB — TSH: TSH: 2.093 u[IU]/mL (ref 0.350–4.500)

## 2014-03-04 ENCOUNTER — Telehealth (HOSPITAL_COMMUNITY): Payer: Self-pay | Admitting: *Deleted

## 2014-03-04 ENCOUNTER — Encounter: Payer: Self-pay | Admitting: Family Medicine

## 2014-03-04 ENCOUNTER — Ambulatory Visit (INDEPENDENT_AMBULATORY_CARE_PROVIDER_SITE_OTHER): Payer: Medicare PPO | Admitting: Family Medicine

## 2014-03-04 VITALS — BP 170/68 | HR 84 | Ht 76.0 in | Wt 256.0 lb

## 2014-03-04 DIAGNOSIS — R0989 Other specified symptoms and signs involving the circulatory and respiratory systems: Secondary | ICD-10-CM

## 2014-03-04 DIAGNOSIS — Z23 Encounter for immunization: Secondary | ICD-10-CM

## 2014-03-04 DIAGNOSIS — Z0000 Encounter for general adult medical examination without abnormal findings: Secondary | ICD-10-CM

## 2014-03-04 DIAGNOSIS — I10 Essential (primary) hypertension: Secondary | ICD-10-CM

## 2014-03-04 DIAGNOSIS — E1121 Type 2 diabetes mellitus with diabetic nephropathy: Secondary | ICD-10-CM

## 2014-03-04 LAB — POCT URINALYSIS DIPSTICK
Bilirubin, UA: NEGATIVE
Blood, UA: NEGATIVE
Glucose, UA: NEGATIVE
Ketones, UA: NEGATIVE
Leukocytes, UA: NEGATIVE
Nitrite, UA: NEGATIVE
Protein, UA: NEGATIVE
Spec Grav, UA: >=1.03
Urobilinogen, UA: NEGATIVE
pH, UA: 6

## 2014-03-04 NOTE — Patient Instructions (Signed)
  HEALTH MAINTENANCE RECOMMENDATIONS:  It is recommended that you get at least 30 minutes of aerobic exercise at least 5 days/week (for weight loss, you may need as much as 60-90 minutes). This can be any activity that gets your heart rate up. This can be divided in 10-15 minute intervals if needed, but try and build up your endurance at least once a week.  Weight bearing exercise is also recommended twice weekly.  Eat a healthy diet with lots of vegetables, fruits and fiber.  "Colorful" foods have a lot of vitamins (ie green vegetables, tomatoes, red peppers, etc).  Limit sweet tea, regular sodas and alcoholic beverages, all of which has a lot of calories and sugar.  Up to 2 alcoholic drinks daily may be beneficial for men (unless trying to lose weight, watch sugars).  Drink a lot of water.  Sunscreen of at least SPF 30 should be used on all sun-exposed parts of the skin when outside between the hours of 10 am and 4 pm (not just when at beach or pool, but even with exercise, golf, tennis, and yard work!)  Use a sunscreen that says "broad spectrum" so it covers both UVA and UVB rays, and make sure to reapply every 1-2 hours.  Remember to change the batteries in your smoke detectors when changing your clock times in the spring and fall.  Use your seat belt every time you are in a car, and please drive safely and not be distracted with cell phones and texting while driving.   Drink more fluids. Check your sugars twice daily until you are seen again for review of your list of sugars--2 hours after a meal, and in the morning. Keep a list with columns, including date,morning, after a meal, and "comments" section for you to mark any comments such as why it might be high or low--if you didn't feel well, ate cake, etc. Bring this list, along with your blood pressures, to your next visit. Try and lose some of the abdominal fat (lose inches at your waist).

## 2014-03-04 NOTE — Progress Notes (Signed)
Chief Complaint  Patient presents with  . Annual Exam    nonfasting annual exam, labs already done. No concerns.   James Glover is a 69 y.o. male who presents for a complete physical.  He has the following concerns:  He had labs done prior to visit, but was unable to void for urine sample then.  His insurance does not cover med check and physical at the same day, so today only physical is being done.  Lab results were reviewed/communicated with the patient. He states that his blood pressure is being monitored at home, and is much lower than it is today (he checks it after exercising at the gym).  He has been checking sugars just in the morning. He did not bring his list today. He does admit to gaining some weight over the holidays.  Hypogonadism--he reports that he was only on 1 pump prior to last labs, where testosterone level was very high.  So medication was stopped upon getting those results.  Testosterone was rechecked with recent labs.  Denies ED or decreased libido  Immunization History  Administered Date(s) Administered  . DTaP 07/25/2008  . Influenza Split 11/17/2011, 12/30/2013  . Influenza, High Dose Seasonal PF 01/30/2013  . Pneumococcal Polysaccharide-23 11/29/2009  . Tdap 07/25/2008  . Zoster 09/05/2013   Last colonoscopy: 05/05/10  Last PSA: 09/2013, normal  Dentist: goes 2-3 times per year, got implant in September 2015 Ophtho: he goes yearly, and thinks he is due.  Exercise: walking 8 miles daily on combination of treadmill, bicycle and elliptical along with weights daily.  When at the beach, he walks 2 miles/day (one week/month). End of life discussion: Doesn't have POA or living will. Got paperwork in the past, but states it is too morbid to think about, so hasn't done.  OTHER DOCTORS INVOLVED IN PATIENT'S CARE:  Dentist: Dr. Mauro Kaufmann Ophtho: Dr. Satira Sark ([place] ophtho) GI: Dr. Collene Mares  Nephro: Dr. Marval Regal (seeing him every 8 months now) Cardiologist: Dr.  Claiborne Billings Dermatologist: Dr. Tonia Brooms  Past Medical History  Diagnosis Date  . Diabetes mellitus   . Hyperlipidemia   . Hypertension   . GERD (gastroesophageal reflux disease)   . Hemorrhoids     internal and external  . Diverticulosis   . Hay fever   . Hypogonadism male   . Erectile dysfunction     Past Surgical History  Procedure Laterality Date  . Circumcision  age 33  . Dental surgery      implants    History   Social History  . Marital Status: Married    Spouse Name: N/A    Number of Children: 0  . Years of Education: N/A   Occupational History  . 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 Smoking status: Never Smoker   . Smokeless tobacco: Never Used  . Alcohol Use: 0.0 oz/week    0 Not specified per week     Comment: glass of wine once or twice a week  . Drug Use: No  . Sexual Activity: Not on file   Other Topics Concern  . Not on file   Social History Narrative   Retired 06/2011.  Lives with wife, 1 cat    Family History  Problem Relation Age of Onset  . Lymphoma Father   . Cancer Father     lymphoma  . Hypertension Father   . Lymphoma Brother   . Cancer Brother     lymphoma  .  Lymphoma Paternal Grandfather   . Cancer Paternal Grandfather     lymphoma  . Dementia Mother   . Diabetes Mother   . Diabetes Sister   . Heart disease Neg Hx   . Cancer Brother     skin cancer    Outpatient Encounter Prescriptions as of 03/04/2014  Medication Sig Note  . aspirin 81 MG tablet Take 81 mg by mouth daily.     Marland Kitchen glipiZIDE (GLUCOTROL) 5 MG tablet TAKE 1 TABLET BY MOUTH EVERY DAY BEFORE SUPPER   . glucose blood (FREESTYLE LITE) test strip Use as instructed   . glucose blood (FREESTYLE TEST STRIPS) test strip 1 each by Other route as needed for other. Use as instructed   . hydrochlorothiazide (HYDRODIURIL) 25 MG tablet TAKE 1 TABLET BY MOUTH EVERY DAY   . Lancets (FREESTYLE) lancets Test daily   . losartan (COZAAR) 50 MG  tablet TAKE 1 TABLET BY MOUTH EVERY DAY   . Omega-3 Fatty Acids (FISH OIL) 1000 MG CAPS Take 2 capsules by mouth daily.   . ONGLYZA 2.5 MG TABS tablet TAKE 1 TABLET BY MOUTH EVERY DAY   . pioglitazone (ACTOS) 45 MG tablet TAKE 1 TABLET BY MOUTH EVERY DAY   . simvastatin (ZOCOR) 20 MG tablet TAKE 1 TABLET BY MOUTH AT BEDTIME   . vardenafil (LEVITRA) 20 MG tablet Take 1 tablet (20 mg total) by mouth daily as needed for erectile dysfunction. 09/03/2013: Uses prn  . Testosterone (ANDROGEL) 20.25 MG/1.25GM (1.62%) GEL Apply 2 pumps to skin every morning, as directed (Patient not taking: Reported on 03/04/2014) 03/04/2014: Stopped in August after high levels of testosterone (had only been using 1 pump daily)    Allergies  Allergen Reactions  . Ace Inhibitors     cough   ROS: The patient denies anorexia, fever, weight changes (just a few pound weight gain over the holidays), headaches, vision loss, decreased hearing, ear pain, hoarseness, chest pain, palpitations, dizziness, syncope, dyspnea on exertion, cough, swelling, nausea, vomiting, diarrhea, constipation, abdominal pain, melena, hematochezia, indigestion/heartburn, hematuria, incontinence, weakened urine stream, dysuria, genital lesions, joint pains, numbness, tingling, weakness, tremor, suspicious skin lesions, depression, anxiety, abnormal bleeding/bruising, or enlarged lymph nodes.  Up 2-3 times/night to void. No urinary hesitancy. He occasionally gets jock itch, not too bad right now.  PHYSICAL EXAM:  BP 170/68 mmHg  Pulse 84  Ht 6\' 4"  (1.93 m)  Wt 256 lb (116.121 kg)  BMI 31.17 kg/m2  846 systolic on repeat.  Could hear the pulse all the way down to zero--audible with stethoscope in both arms without cuff inflated, at rest.   General Appearance:  Alert, cooperative, no distress, appears stated age   Head:  Normocephalic, without obvious abnormality, atraumatic   Eyes:  PERRL, conjunctiva/corneas clear, EOM's intact, fundi  benign    Ears:  Normal TM's and external ear canals   Nose:  Nares normal, mucosa normal, no drainage or sinus tenderness   Throat:  Lips, mucosa, and tongue normal; teeth and gums normal   Neck:  Supple, no lymphadenopathy; thyroid: no enlargement/tenderness/nodules; no carotid  bruit or JVD   Back:  Spine nontender, no curvature, ROM normal, no CVA tenderness   Lungs:  Clear to auscultation bilaterally without wheezes, rales or ronchi; respirations unlabored   Chest Wall:  No tenderness or deformity   Heart:  Regular rate and rhythm, S1 and S2 normal, no murmur, rub  or gallop   Breast Exam:  No chest wall tenderness, masses or gynecomastia  Abdomen:  Soft, non-tender, nondistended, normoactive bowel sounds,  no masses, no hepatosplenomegaly. Mild ventral hernia, nontender. +abdominal obesity  Genitalia:  Normal male external genitalia without lesions. + piercing. Testicles without masses. No inguinal hernias.   Rectal:  Normal sphincter tone, no masses or tenderness; guaiac negative stool. Prostate smooth, no nodules, not enlarged.   Extremities:  No clubbing, cyanosis; no edema  Pulses:  2+ and symmetric all extremities   Skin:  Skin texture, turgor normal, no rashes or lesions. Normal diabetic foot exam   Lymph nodes:  Cervical, supraclavicular, and axillary nodes normal   Neurologic:  CNII-XII intact, normal strength, sensation and gait; reflexes 2+ and symmetric throughout    Psych: Normal mood, affect, hygiene and grooming.   ASSESSMENT/PLAN:  Annual physical exam - Plan: Visual acuity screening, POCT Urinalysis Dipstick  Essential hypertension, benign - elevated here, normal at home; some white coat component. continue to monitor regularly.  continue exercise, low Na diet. Weight loss rec.  Type II diabetes mellitus with nephropathy - rise in A1c.  Pt to return within the week with list of blood sugars, to check BID -  Plan: Microalbumin/Creatinine Ratio, Urine  Immunization due - Plan: Pneumococcal conjugate vaccine 13-valent  Diminished pulses in lower extremity - refer for ABI - Plan: Ankle brachial index  HTN--elevated today. Reviewed low sodium diet. Continue to monitor. Continue current meds.  Discussed PSA screening (risks/benefits), recommended at least 30 minutes of aerobic activity at least 5 days/week; proper sunscreen use reviewed; healthy diet and alcohol recommendations (less than or equal to 2 drinks/day) reviewed; regular seatbelt use; changing batteries in smoke detectors. Self-testicular exams. Immunization recommendations discussed--Prevnar-13 today. Colonoscopy recommendations reviewed, UTD  Reviewed living will and healthcare power of attorney info again--he has the paperwork at home. Encouraged to fill out; counseled extensively re: reasons to discuss with wife, and that she should also fill out.  Drink more fluids. Check your sugars twice daily until you are seen again for review of your list of sugars--2 hours after a meal, and in the morning. Keep a list with columns, including date,morning, after a meal, and "comments" section for you to mark any comments such as why it might be high or low--if you didn't feel well, ate cake, etc. Bring this list, along with your blood pressures, to your next visit. Try and lose some of the abdominal fat (lose inches at your waist).  Please look at and discuss the Living Will and healthcare power of attorney paperwork with your wife.  Fill it out, get it notarized, and feel free to drop off a copy to [place] at your next visit or sooner.  Med check this week  Send copies of labs to Dr. Marval Regal and Dr. Georgina Peer   Decreased DP pulse on the right. Top of foot is much cooler than the left  ABI referral

## 2014-03-05 LAB — MICROALBUMIN / CREATININE URINE RATIO
Creatinine, Urine: 216.8 mg/dL
Microalb Creat Ratio: 5.1 mg/g (ref 0.0–30.0)
Microalb, Ur: 1.1 mg/dL (ref <2.0)

## 2014-03-11 ENCOUNTER — Other Ambulatory Visit: Payer: Self-pay | Admitting: Family Medicine

## 2014-03-13 ENCOUNTER — Ambulatory Visit (INDEPENDENT_AMBULATORY_CARE_PROVIDER_SITE_OTHER): Payer: Medicare PPO | Admitting: Family Medicine

## 2014-03-13 ENCOUNTER — Encounter: Payer: Self-pay | Admitting: Family Medicine

## 2014-03-13 VITALS — BP 140/80 | HR 68 | Ht 76.0 in | Wt 256.0 lb

## 2014-03-13 DIAGNOSIS — I10 Essential (primary) hypertension: Secondary | ICD-10-CM

## 2014-03-13 DIAGNOSIS — E1121 Type 2 diabetes mellitus with diabetic nephropathy: Secondary | ICD-10-CM

## 2014-03-13 DIAGNOSIS — E785 Hyperlipidemia, unspecified: Secondary | ICD-10-CM

## 2014-03-13 MED ORDER — SAXAGLIPTIN HCL 2.5 MG PO TABS: 2.5000 mg | ORAL_TABLET | Freq: Every day | ORAL | Status: DC

## 2014-03-13 MED ORDER — HYDROCHLOROTHIAZIDE 25 MG PO TABS: ORAL_TABLET | ORAL | Status: DC

## 2014-03-13 MED ORDER — LOSARTAN POTASSIUM 50 MG PO TABS: 50.0000 mg | ORAL_TABLET | Freq: Every day | ORAL | Status: DC

## 2014-03-13 MED ORDER — SIMVASTATIN 20 MG PO TABS: ORAL_TABLET | ORAL | Status: DC

## 2014-03-13 MED ORDER — PIOGLITAZONE HCL 45 MG PO TABS: ORAL_TABLET | ORAL | Status: DC

## 2014-03-13 NOTE — Progress Notes (Signed)
Chief Complaint  Patient presents with  . Diabetes    nonfasting med check-labs already done.    Patient presents for med check.  Briefly reviewed labs at his physical, but is here today to review in more detail.  Today he brings his records of BP's and sugars from home.  He needs medication refills.  HTN: Blood pressures at home are running 108-130/60-72.  Most of these were taken after exercise, at the gym. Denies headaches, dizziness, chest pain, edema, claudication or side effects to medications.  Diabetes f/u: Blood sugars (since the end of October):  Mornings are running 128-148; pms--these are actually before lunch, and after exercise--running 84-124.  These are checked about daily.  He has checked it only twice after dinner, and was 156, 160. Denies polydipsia/polyuria.  He was noted to have diminished pulse at his last visit and was referred for ABI.  He got a call from Dr. Lucy Chris office, and scheduled for ABI. He denies any claudication.  PMH, PSH, SH reviewed.  Outpatient Encounter Prescriptions as of 03/13/2014  Medication Sig Note  . aspirin 81 MG tablet Take 81 mg by mouth daily.     Marland Kitchen glipiZIDE (GLUCOTROL) 5 MG tablet TAKE 1 TABLET BY MOUTH EVERY DAY BEFORE SUPPER   . glucose blood (FREESTYLE LITE) test strip Use as instructed   . glucose blood (FREESTYLE TEST STRIPS) test strip 1 each by Other route as needed for other. Use as instructed   . hydrochlorothiazide (HYDRODIURIL) 25 MG tablet TAKE 1 TABLET BY MOUTH EVERY DAY   . Lancets (FREESTYLE) lancets Test daily   . losartan (COZAAR) 50 MG tablet Take 1 tablet (50 mg total) by mouth daily.   . Omega-3 Fatty Acids (FISH OIL) 1000 MG CAPS Take 2 capsules by mouth daily.   . pioglitazone (ACTOS) 45 MG tablet TAKE 1 TABLET BY MOUTH EVERY DAY   . saxagliptin HCl (ONGLYZA) 2.5 MG TABS tablet Take 1 tablet (2.5 mg total) by mouth daily.   . simvastatin (ZOCOR) 20 MG tablet TAKE 1 TABLET BY MOUTH AT BEDTIME   . Testosterone  (ANDROGEL) 20.25 MG/1.25GM (1.62%) GEL Apply 2 pumps to skin every morning, as directed 03/04/2014: Stopped in August after high levels of testosterone (had only been using 1 pump daily)  . vardenafil (LEVITRA) 20 MG tablet Take 1 tablet (20 mg total) by mouth daily as needed for erectile dysfunction. 09/03/2013: Uses prn  . [DISCONTINUED] hydrochlorothiazide (HYDRODIURIL) 25 MG tablet TAKE 1 TABLET BY MOUTH EVERY DAY   . [DISCONTINUED] losartan (COZAAR) 50 MG tablet TAKE 1 TABLET BY MOUTH EVERY DAY   . [DISCONTINUED] ONGLYZA 2.5 MG TABS tablet TAKE 1 TABLET BY MOUTH EVERY DAY   . [DISCONTINUED] pioglitazone (ACTOS) 45 MG tablet TAKE 1 TABLET BY MOUTH EVERY DAY   . [DISCONTINUED] simvastatin (ZOCOR) 20 MG tablet TAKE 1 TABLET BY MOUTH AT BEDTIME    Allergies  Allergen Reactions  . Ace Inhibitors     cough   ROS: no headaches, dizziness, fatigue, nausea, vomiting, bowel changes, URI symptoms, cough, shortness of breath, chest pain, urinary complaints, bleeding/bruising/rashes, depression, anxiety or other complaints.  See HPI  PHYSICAL EXAM:  BP 140/80 mmHg  Pulse 68  Ht 6\' 4"  (1.93 m)  Wt 256 lb (116.121 kg)  BMI 31.17 kg/m2  144/64 on repeat by MD, RA Well developed, pleasant overweight male in no distress Heart: regular rate and rhythm Lungs: clear bilaterally Back: no CVA tenderness Abdomen: soft, nontender, no mass Extremities: no  edema Skin: no lesions   Lab Results  Component Value Date   HGBA1C 7.4* 02/25/2014     Chemistry      Component Value Date/Time   NA 136 02/25/2014 0908   K 4.4 02/25/2014 0908   CL 100 02/25/2014 0908   CO2 25 02/25/2014 0908   BUN 29* 02/25/2014 0908   CREATININE 2.04* 02/25/2014 0908      Component Value Date/Time   CALCIUM 9.2 02/25/2014 0908   ALKPHOS 36* 02/25/2014 0908   AST 20 02/25/2014 0908   ALT 18 02/25/2014 0908   BILITOT 0.6 02/25/2014 0908     Fasting glucose 167  Lab Results  Component Value Date   TESTOSTERONE 376  02/25/2014   Lab Results  Component Value Date   CHOL 153 02/25/2014   HDL 50 02/25/2014   LDLCALC 91 02/25/2014   TRIG 58 02/25/2014   CHOLHDL 3.1 02/25/2014   Lab Results  Component Value Date   TSH 2.093 02/25/2014   Lab Results  Component Value Date   PSA 2.88 10/04/2013   PSA 2.69 01/30/2013   PSA 3.03 06/21/2012   ASSESSMENT/PLAN:  Essential hypertension, benign - borderline here; normal elsewhere, but always checked after exercise.  To check other times of day and record - Plan: hydrochlorothiazide (HYDRODIURIL) 25 MG tablet, losartan (COZAAR) 50 MG tablet  Type II diabetes mellitus with nephropathy  Type 2 diabetes with nephropathy - above goal. He mainly checks sugar after exercise/before lunch and always good. check 2 hr PP/bedtime and in the morning instead of lunch. diet reviewed. - Plan: saxagliptin HCl (ONGLYZA) 2.5 MG TABS tablet, pioglitazone (ACTOS) 45 MG tablet  Dyslipidemia - controlled - Plan: simvastatin (ZOCOR) 20 MG tablet   Stop checking sugars before lunch (these are always good, and always after exercise). Continue to check in the mornings (fasting) and 2 hours after dinner (or before bedtime). Continue to keep the list with columns for these two times, and with comments.   F/u 3 months with A1c to be done at visit.

## 2014-03-13 NOTE — Patient Instructions (Signed)
Make columns on your blood pressure log for before and after exercise (when you check at the gym) and also for when checking at home.  You can check less often after exercise (they are all fine).  I want to verify that your blood pressure is still okay at other times.   Bring your blood pressure monitor to your next visit so we can verify the accuracy.  Stop checking sugars before lunch (these are always good, and always after exercise). Continue to check in the mornings (fasting) and 2 hours after dinner (or before bedtime). Continue to keep the list with columns for these two times, and with comments.   Continue your current medications. Follow up with Dr. Claiborne Billings as scheduled for vascular studies.  Return in 3 months

## 2014-03-21 ENCOUNTER — Ambulatory Visit (HOSPITAL_COMMUNITY)
Admission: RE | Admit: 2014-03-21 | Discharge: 2014-03-21 | Disposition: A | Discharge status: Home / Self Care | Payer: Medicare FFS | Source: Ambulatory Visit | Attending: Family Medicine | Admitting: Family Medicine

## 2014-03-21 DIAGNOSIS — R0989 Other specified symptoms and signs involving the circulatory and respiratory systems: Secondary | ICD-10-CM

## 2014-03-21 NOTE — Progress Notes (Signed)
ABI Completed. No evidence for arterial insufficiency. [place]

## 2014-03-27 ENCOUNTER — Telehealth: Payer: Self-pay | Admitting: *Deleted

## 2014-03-27 NOTE — Telephone Encounter (Signed)
Patient was informed of ABI results.  Please advise pt that his ABI's were normal (arterial circulation in legs is fine).

## 2014-06-06 ENCOUNTER — Other Ambulatory Visit: Payer: Self-pay | Admitting: Family Medicine

## 2014-06-19 ENCOUNTER — Ambulatory Visit (INDEPENDENT_AMBULATORY_CARE_PROVIDER_SITE_OTHER): Payer: Medicare PPO | Admitting: Family Medicine

## 2014-06-19 ENCOUNTER — Encounter: Payer: Self-pay | Admitting: Family Medicine

## 2014-06-19 VITALS — BP 138/72 | HR 64 | Ht 76.0 in | Wt 254.0 lb

## 2014-06-19 DIAGNOSIS — I10 Essential (primary) hypertension: Secondary | ICD-10-CM

## 2014-06-19 DIAGNOSIS — Z125 Encounter for screening for malignant neoplasm of prostate: Secondary | ICD-10-CM

## 2014-06-19 DIAGNOSIS — E785 Hyperlipidemia, unspecified: Secondary | ICD-10-CM

## 2014-06-19 DIAGNOSIS — E119 Type 2 diabetes mellitus without complications: Secondary | ICD-10-CM

## 2014-06-19 DIAGNOSIS — E1121 Type 2 diabetes mellitus with diabetic nephropathy: Secondary | ICD-10-CM | POA: Diagnosis not present

## 2014-06-19 LAB — POCT GLYCOSYLATED HEMOGLOBIN (HGB A1C): Hemoglobin A1C: 6.6

## 2014-06-19 MED ORDER — GLIPIZIDE 5 MG PO TABS: ORAL_TABLET | ORAL | Status: DC

## 2014-06-19 NOTE — Patient Instructions (Signed)
Great job in getting your sugars down. Your blood pressure is very good. Continue your current medications, daily exercise. Continue to check sugars once daily (either morning or evening) and keep on log like you did for today's visit. You can probably check your BP less often--1-2x per week, as long as they remain good (<135/85).

## 2014-06-19 NOTE — Progress Notes (Signed)
Chief Complaint  Patient presents with  . Diabetes    nonfasting med check.    Patient presents for 3 month f/u on diabetes.  His last A1c was 7.4.  At his last visit he was told to stop checking his sugars before lunch, when always were good (right after exercising), and instead to check in the mornings and post-prandial.  He did exactly as he was asked. Morning sugars are running 90-142, mostly 115-135.  Before bedtime 72-149.  Denies hypoglycemia; no excessive thirst.  Continues to void frequently, unchanged.  He continues to exercise 2 hours daily.  He has lost 2 pounds since his last visit.  Hypertension:  He was also asked to check his BP's at other times, not just after exercise. BP's before exercise: 105-138/57-68 (mostly 120's/60); BP's at home (afternoon/evening) ranged from 120-135/61-72 (mostly 125-130/60's).  PMH, PSH, SH reviewed.  Outpatient Encounter Prescriptions as of 06/19/2014  Medication Sig Note  . aspirin 81 MG tablet Take 81 mg by mouth daily.     Marland Kitchen glipiZIDE (GLUCOTROL) 5 MG tablet TAKE 1 TABLET BY MOUTH DAILY BEFORE SUPPER   . glucose blood (FREESTYLE LITE) test strip Use as instructed   . glucose blood (FREESTYLE TEST STRIPS) test strip 1 each by Other route as needed for other. Use as instructed   . hydrochlorothiazide (HYDRODIURIL) 25 MG tablet TAKE 1 TABLET BY MOUTH EVERY DAY   . Lancets (FREESTYLE) lancets Test daily   . losartan (COZAAR) 50 MG tablet Take 1 tablet (50 mg total) by mouth daily.   . Omega-3 Fatty Acids (FISH OIL) 1000 MG CAPS Take 2 capsules by mouth daily.   . pioglitazone (ACTOS) 45 MG tablet TAKE 1 TABLET BY MOUTH EVERY DAY   . saxagliptin HCl (ONGLYZA) 2.5 MG TABS tablet Take 1 tablet (2.5 mg total) by mouth daily.   . simvastatin (ZOCOR) 20 MG tablet TAKE 1 TABLET BY MOUTH AT BEDTIME   . Testosterone (ANDROGEL) 20.25 MG/1.25GM (1.62%) GEL Apply 2 pumps to skin every morning, as directed (Patient not taking: Reported on 06/19/2014) 03/04/2014:  Stopped in August after high levels of testosterone (had only been using 1 pump daily)  . vardenafil (LEVITRA) 20 MG tablet Take 1 tablet (20 mg total) by mouth daily as needed for erectile dysfunction. 09/03/2013: Uses prn  . [DISCONTINUED] glipiZIDE (GLUCOTROL) 5 MG tablet TAKE 1 TABLET BY MOUTH DAILY BEFORE SUPPER   . [DISCONTINUED] simvastatin (ZOCOR) 20 MG tablet TAKE 1 TABLET BY MOUTH EVERY NIGHT AT BEDTIME    Allergies  Allergen Reactions  . Ace Inhibitors     cough   ROS:  No fevers, chills, URI or allergy complaints; no chest pain, shortness of breath, palpitations, cough.  No nausea, vomiting, bowel or bladder changes.  No bleeding, bruising, rash, joint pains, depression or other complaints  PHYSICAL EXAM: BP 138/72 mmHg  Pulse 64  Ht 6\' 4"  (1.93 m)  Wt 254 lb (115.214 kg)  BMI 30.93 kg/m2 Well developed, pleasant male in no distress Neck: no lymphadenopathy, thyromegaly or mass Heart: regular rate and rhythm Lungs: clear bilaterally Extremities: no edema Skin: no rash Neuro: alert and oriented. Cranial nerves intact. Normal strength, gait Psych: normal mood, affect, hygiene and grooming   Lab Results  Component Value Date   HGBA1C 6.6 06/19/2014   ASSESSMENT/PLAN:  Diabetes mellitus without complication - Plan: HgB A1c  Essential hypertension, benign - well controlled per home numbers. continue current regimen - Plan: Comprehensive metabolic panel  Type II diabetes  mellitus with nephropathy - improved control.  continue current regimen, daily exercise, and appropriate diet. - Plan: glipiZIDE (GLUCOTROL) 5 MG tablet, Comprehensive metabolic panel  Hyperlipidemia - Plan: Lipid panel, Comprehensive metabolic panel  Screening for prostate cancer - Plan: PSA, Medicare  F/u 3 months for med check with fasting labs prior; sooner prn.

## 2014-08-06 ENCOUNTER — Encounter: Payer: Self-pay | Admitting: *Deleted

## 2014-09-19 ENCOUNTER — Other Ambulatory Visit: Payer: Self-pay | Admitting: Family Medicine

## 2014-10-03 ENCOUNTER — Other Ambulatory Visit: Payer: Medicare PPO

## 2014-10-03 DIAGNOSIS — E785 Hyperlipidemia, unspecified: Secondary | ICD-10-CM | POA: Diagnosis not present

## 2014-10-03 DIAGNOSIS — I10 Essential (primary) hypertension: Secondary | ICD-10-CM

## 2014-10-03 DIAGNOSIS — E1121 Type 2 diabetes mellitus with diabetic nephropathy: Secondary | ICD-10-CM | POA: Diagnosis not present

## 2014-10-03 DIAGNOSIS — Z125 Encounter for screening for malignant neoplasm of prostate: Secondary | ICD-10-CM | POA: Diagnosis not present

## 2014-10-03 DIAGNOSIS — E1129 Type 2 diabetes mellitus with other diabetic kidney complication: Secondary | ICD-10-CM | POA: Diagnosis not present

## 2014-10-03 LAB — COMPREHENSIVE METABOLIC PANEL
ALT: 12 U/L (ref 9–46)
AST: 17 U/L (ref 10–35)
Albumin: 3.7 g/dL (ref 3.6–5.1)
Alkaline Phosphatase: 32 U/L — ABNORMAL LOW (ref 40–115)
BUN: 27 mg/dL — ABNORMAL HIGH (ref 7–25)
CO2: 25 mmol/L (ref 20–31)
Calcium: 8.8 mg/dL (ref 8.6–10.3)
Chloride: 101 mmol/L (ref 98–110)
Creat: 1.72 mg/dL — ABNORMAL HIGH (ref 0.70–1.25)
Glucose, Bld: 123 mg/dL — ABNORMAL HIGH (ref 65–99)
Potassium: 4.2 mmol/L (ref 3.5–5.3)
Sodium: 137 mmol/L (ref 135–146)
Total Bilirubin: 0.8 mg/dL (ref 0.2–1.2)
Total Protein: 6.3 g/dL (ref 6.1–8.1)

## 2014-10-03 LAB — LIPID PANEL
Cholesterol: 133 mg/dL (ref 125–200)
HDL: 53 mg/dL (ref >=40)
LDL Cholesterol: 66 mg/dL (ref <130)
Total CHOL/HDL Ratio: 2.5 Ratio (ref <=5.0)
Triglycerides: 72 mg/dL (ref <150)
VLDL: 14 mg/dL (ref <30)

## 2014-10-04 LAB — PSA, MEDICARE: PSA: 2.99 ng/mL (ref <=4.00)

## 2014-10-09 ENCOUNTER — Other Ambulatory Visit: Payer: Self-pay | Admitting: Family Medicine

## 2014-10-10 ENCOUNTER — Ambulatory Visit (INDEPENDENT_AMBULATORY_CARE_PROVIDER_SITE_OTHER): Payer: Medicare PPO | Admitting: Family Medicine

## 2014-10-10 ENCOUNTER — Encounter: Payer: Self-pay | Admitting: Family Medicine

## 2014-10-10 VITALS — BP 136/64 | HR 60 | Ht 76.0 in | Wt 256.2 lb

## 2014-10-10 DIAGNOSIS — E785 Hyperlipidemia, unspecified: Secondary | ICD-10-CM | POA: Diagnosis not present

## 2014-10-10 DIAGNOSIS — E119 Type 2 diabetes mellitus without complications: Secondary | ICD-10-CM

## 2014-10-10 DIAGNOSIS — E1121 Type 2 diabetes mellitus with diabetic nephropathy: Secondary | ICD-10-CM

## 2014-10-10 DIAGNOSIS — I10 Essential (primary) hypertension: Secondary | ICD-10-CM

## 2014-10-10 LAB — POCT GLYCOSYLATED HEMOGLOBIN (HGB A1C): Hemoglobin A1C: 6

## 2014-10-10 MED ORDER — PIOGLITAZONE HCL 45 MG PO TABS: 45.0000 mg | ORAL_TABLET | Freq: Every day | ORAL | Status: DC

## 2014-10-10 MED ORDER — SIMVASTATIN 20 MG PO TABS: ORAL_TABLET | ORAL | Status: DC

## 2014-10-10 MED ORDER — SAXAGLIPTIN HCL 2.5 MG PO TABS: 2.5000 mg | ORAL_TABLET | Freq: Every day | ORAL | Status: DC

## 2014-10-10 MED ORDER — LOSARTAN POTASSIUM 50 MG PO TABS: 50.0000 mg | ORAL_TABLET | Freq: Every day | ORAL | Status: DC

## 2014-10-10 MED ORDER — HYDROCHLOROTHIAZIDE 25 MG PO TABS: ORAL_TABLET | ORAL | Status: DC

## 2014-10-10 MED ORDER — GLIPIZIDE 5 MG PO TABS: ORAL_TABLET | ORAL | Status: DC

## 2014-10-10 NOTE — Progress Notes (Signed)
Chief Complaint  Patient presents with  . Diabetes    nonfasting med check-they did not do A1C with his labs last week. Has not taken onglyza and glipezide in the last 3 days.    Patient presents to follow up on diabetes.  Morning sugars are running 92-133, mostly 100-125.  Evening sugars (before bedtime) are 68-135, mostly 85-125.  Denies hypoglycemia, polydipsia, polyuria.  He continues to exercise 2 hours every day, and reports compliance with diet.  Denies any numbness, tingling, lesions/sores.  Hypertension follow-up:  Blood pressures in the morning, before exercise runs 99-130/55-67 (mostly 110-120/60).  Evening BP's are 115-125/55-62.  Denies dizziness, headaches, chest pain.  Denies side effects of medications.  PMH, PSH, SH reviewed.  Outpatient Encounter Prescriptions as of 10/10/2014  Medication Sig Note  . aspirin 81 MG tablet Take 81 mg by mouth daily.     Marland Kitchen glucose blood (FREESTYLE LITE) test strip Use as instructed   . glucose blood (FREESTYLE TEST STRIPS) test strip 1 each by Other route as needed for other. Use as instructed   . hydrochlorothiazide (HYDRODIURIL) 25 MG tablet TAKE 1 TABLET BY MOUTH EVERY DAY   . Lancets (FREESTYLE) lancets Test daily   . losartan (COZAAR) 50 MG tablet Take 1 tablet (50 mg total) by mouth daily.   . Omega-3 Fatty Acids (FISH OIL) 1000 MG CAPS Take 2 capsules by mouth daily.   . pioglitazone (ACTOS) 45 MG tablet Take 1 tablet (45 mg total) by mouth daily.   . saxagliptin HCl (ONGLYZA) 2.5 MG TABS tablet Take 1 tablet (2.5 mg total) by mouth daily.   . simvastatin (ZOCOR) 20 MG tablet TAKE 1 TABLET BY MOUTH AT BEDTIME   . vardenafil (LEVITRA) 20 MG tablet Take 1 tablet (20 mg total) by mouth daily as needed for erectile dysfunction. 09/03/2013: Uses prn  . [DISCONTINUED] hydrochlorothiazide (HYDRODIURIL) 25 MG tablet TAKE 1 TABLET BY MOUTH EVERY DAY   . [DISCONTINUED] losartan (COZAAR) 50 MG tablet TAKE 1 TABLET BY MOUTH EVERY DAY   .  [DISCONTINUED] pioglitazone (ACTOS) 45 MG tablet TAKE 1 TABLET BY MOUTH EVERY DAY   . [DISCONTINUED] saxagliptin HCl (ONGLYZA) 2.5 MG TABS tablet Take 1 tablet (2.5 mg total) by mouth daily.   . [DISCONTINUED] simvastatin (ZOCOR) 20 MG tablet TAKE 1 TABLET BY MOUTH AT BEDTIME   . glipiZIDE (GLUCOTROL) 5 MG tablet TAKE 1 TABLET BY MOUTH DAILY BEFORE SUPPER   . valACYclovir (VALTREX) 1000 MG tablet Take 2,000 mg by mouth as needed.  10/10/2014: From her dermatologist ([place]); prn fever blisters  . [DISCONTINUED] glipiZIDE (GLUCOTROL) 5 MG tablet TAKE 1 TABLET BY MOUTH DAILY BEFORE SUPPER (Patient not taking: Reported on 10/10/2014) 10/10/2014: Ran out 3 days ago  . [DISCONTINUED] Testosterone (ANDROGEL) 20.25 MG/1.25GM (1.62%) GEL Apply 2 pumps to skin every morning, as directed (Patient not taking: Reported on 06/19/2014) 03/04/2014: Stopped in August after high levels of testosterone (had only been using 1 pump daily)   No facility-administered encounter medications on file as of 10/10/2014.   Allergies  Allergen Reactions  . Ace Inhibitors     cough   ROS:  No weight changes, headaches, dizziness, numbness, tingling, shortness of breath, URI symptoms ,chest pain, edema, chest pain, palpitations, GI or GU complaints. No bleeding, bruising, rash, moods are good.  ASSESSMENT/PLAN: BP 136/64 mmHg  Pulse 60  Ht 6\' 4"  (1.93 m)  Wt 256 lb 3.2 oz (116.212 kg)  BMI 31.20 kg/m2  Well developed, pleasant male in no  distress Neck: no lymphadenopathy, thyromegaly or mass, no bruit. Heart: regular rate and rhythm Lungs: clear bilaterally Extremities: no edema, 2+ pulses Skin: no rash Neuro: alert and oriented. Cranial nerves intact. Normal strength, gait Psych: normal mood, affect, hygiene and grooming    Chemistry      Component Value Date/Time   NA 137 10/03/2014 0747   K 4.2 10/03/2014 0747   CL 101 10/03/2014 0747   CO2 25 10/03/2014 0747   BUN 27* 10/03/2014 0747   CREATININE 1.72*  10/03/2014 0747      Component Value Date/Time   CALCIUM 8.8 10/03/2014 0747   ALKPHOS 32* 10/03/2014 0747   AST 17 10/03/2014 0747   ALT 12 10/03/2014 0747   BILITOT 0.8 10/03/2014 0747     Fasting glucose 123  Lab Results  Component Value Date   PSA 2.99 10/03/2014   PSA 2.88 10/04/2013   PSA 2.69 01/30/2013   Lab Results  Component Value Date   CHOL 133 10/03/2014   HDL 53 10/03/2014   LDLCALC 66 10/03/2014   TRIG 72 10/03/2014   CHOLHDL 2.5 10/03/2014    ASSESSMENT/PLAN:  Type 2 diabetes with nephropathy - well controlled on current regimen.Nephropathy--stable.  Dr. Lurline Del last note reviewed. - Plan: saxagliptin HCl (ONGLYZA) 2.5 MG TABS tablet, pioglitazone (ACTOS) 45 MG tablet, glipiZIDE (GLUCOTROL) 5 MG tablet  Diabetes mellitus without complication - Plan: HgB A1c  Essential hypertension, benign - well controlled - Plan: losartan (COZAAR) 50 MG tablet, hydrochlorothiazide (HYDRODIURIL) 25 MG tablet  Dyslipidemia - at goal on current regimen - Plan: simvastatin (ZOCOR) 20 MG tablet  Encouraged weight loss.  Portion control discussed, as he appears to get plenty of exercise, and choose a healthy diet.  BP higher here than at home, but brings in list of many values, all at goal.  Continue to f/u with Dr. Jabier Gauss is stable, BP's well controlled.   F/u 6 months for CPE (fasting), and med check a week later (can't do same day due to [employer])

## 2014-10-10 NOTE — Patient Instructions (Signed)
Your blood pressure and diabetes are under excellent control.  Continue your healthy diet, regular exercise. Try and watch the portion control at home and in restaurants.   Return in about 6 months for your physical, and the med check a week later (can't be done the same day due to insurance reasons).

## 2014-10-11 ENCOUNTER — Encounter: Payer: Self-pay | Admitting: Family Medicine

## 2015-01-09 ENCOUNTER — Telehealth: Payer: Self-pay | Admitting: Internal Medicine

## 2015-01-09 NOTE — Telephone Encounter (Signed)
Spoke with patient and he has not had an diabetic exam in over a year and half and i told him since he had diabetes that it was recommended that he get one yearly. He will call and schedule one with Dr. Satira Sark in [place]

## 2015-01-27 DIAGNOSIS — N182 Chronic kidney disease, stage 2 (mild): Secondary | ICD-10-CM | POA: Diagnosis not present

## 2015-01-27 DIAGNOSIS — L089 Local infection of the skin and subcutaneous tissue, unspecified: Secondary | ICD-10-CM | POA: Diagnosis not present

## 2015-01-27 DIAGNOSIS — E1122 Type 2 diabetes mellitus with diabetic chronic kidney disease: Secondary | ICD-10-CM | POA: Diagnosis not present

## 2015-01-31 ENCOUNTER — Ambulatory Visit (INDEPENDENT_AMBULATORY_CARE_PROVIDER_SITE_OTHER): Payer: Medicare PPO | Admitting: Medical

## 2015-01-31 ENCOUNTER — Encounter: Payer: Self-pay | Admitting: Medical

## 2015-01-31 VITALS — BP 126/70 | HR 72 | Temp 97.6°F | Wt 257.0 lb

## 2015-01-31 DIAGNOSIS — E1121 Type 2 diabetes mellitus with diabetic nephropathy: Secondary | ICD-10-CM

## 2015-01-31 DIAGNOSIS — B958 Unspecified staphylococcus as the cause of diseases classified elsewhere: Secondary | ICD-10-CM

## 2015-01-31 DIAGNOSIS — L089 Local infection of the skin and subcutaneous tissue, unspecified: Secondary | ICD-10-CM | POA: Diagnosis not present

## 2015-01-31 DIAGNOSIS — L139 Bullous disorder, unspecified: Secondary | ICD-10-CM | POA: Diagnosis not present

## 2015-01-31 MED ORDER — CEPHALEXIN 500 MG PO CAPS: 500.0000 mg | ORAL_CAPSULE | Freq: Four times a day (QID) | ORAL | Status: DC

## 2015-01-31 NOTE — Patient Instructions (Signed)
Recommendations:  increase Keflex to 4 times daily  We want you to use Keflex for at least 10 days, and possibly more depending how well the skin is responding  Begin Lamisil cream OTC in between all toes, bottoms of feet and heels  For any blister that has not popped, you can use Hydrocortisone cream OTC such as Cortaid topically for the intact blisters  Keep feet clean and dry  Plan to recheck with Dr. Tomi Bamberger Thursday unless worse in the meantime

## 2015-01-31 NOTE — Progress Notes (Signed)
Subjective: Chief Complaint  Patient presents with  . blister    on big toe that has now spread to other toes and both feet. said that he was on keflex. said that the three original toes have improved but now other toes are gettting blisters.   Normally sees Dr. Tomi Bamberger here.  He was at the beach last week, went to urgent care after he noticed a large blister of his great toe on the right.  By the time he went to urgent care has blisters of both great toes.  One was lanced and culture done at the urgent care which came back positive for staphylococcus infection, not MRSA.   He was put on Keflex and so far on day 4 of Keflex is seeing improvement.  However today a new blister formed on the left 4th toe.  He denies fever, redness, swelling of leg, no prior similar, no prior cellulitis.  Diabetes is reportedly under good control.   Denies any recent trauma or break in the skin.  No other aggravating or relieving factors. No other complaint.  Past Medical History  Diagnosis Date  . Diabetes mellitus   . Hyperlipidemia   . Hypertension   . GERD (gastroesophageal reflux disease)   . Hemorrhoids     internal and external  . Diverticulosis   . Hay fever   . Hypogonadism male   . Erectile dysfunction   . Chronic kidney disease   . Chronic renal insufficiency, stage II (mild)   . Coronary artery disease    ROS as in subjective   Objective: BP 126/70 mmHg  Pulse 72  Temp(Src) 97.6 F (36.4 C) (Oral)  Wt 257 lb (116.574 kg)  Gen: wd, wn, nad Bilat great toes with shrive up skin dorsally from prior recent bullous lesion, there is a tense bullous clear/serous colored lesion of the distal dorsal lateral 4th toe on the left.  There is puffiness and mild erythema of the toes bilat.  There is a small area of whitish maceration between right 2nd and 3rd toes. Dry skin of lower legs noted.  There is no obvious ulcer or other breaks in the skin Feet are cool, but cap refill normal, pulses 1+ pedal.   Sensation seem intact along with    Assessment: Encounter Diagnoses  Name Primary?  . Staph skin infection Yes  . Bullous dermatitis   . Type II diabetes mellitus with nephropathy (HCC)     Plan: Discussed possible differential (bullous impetigo, bullosus diabeticum, bullous lesions related to staph infection prompted by tinea pedis, or other primary bullous causes).  Called and discussed case with Lennie Odor PA with [facility].  C/t keflex but increase to QID, leg elevated, begin Lamisil cream OTC in between toes and along heels and soles, can use OTC Hydrocortisone cream for the new bullous not ruptured lesions.  Discussed signs/symptoms that would prompt urgent recheck.  Otherwise f/u with Dr. Tomi Bamberger next week.

## 2015-02-06 ENCOUNTER — Ambulatory Visit (INDEPENDENT_AMBULATORY_CARE_PROVIDER_SITE_OTHER): Payer: Medicare PPO | Admitting: Family Medicine

## 2015-02-06 ENCOUNTER — Encounter: Payer: Self-pay | Admitting: Family Medicine

## 2015-02-06 VITALS — BP 130/70 | HR 68 | Ht 76.0 in | Wt 257.0 lb

## 2015-02-06 DIAGNOSIS — L089 Local infection of the skin and subcutaneous tissue, unspecified: Secondary | ICD-10-CM

## 2015-02-06 DIAGNOSIS — E1121 Type 2 diabetes mellitus with diabetic nephropathy: Secondary | ICD-10-CM

## 2015-02-06 DIAGNOSIS — B958 Unspecified staphylococcus as the cause of diseases classified elsewhere: Secondary | ICD-10-CM | POA: Diagnosis not present

## 2015-02-06 DIAGNOSIS — B353 Tinea pedis: Secondary | ICD-10-CM

## 2015-02-06 DIAGNOSIS — I10 Essential (primary) hypertension: Secondary | ICD-10-CM

## 2015-02-06 NOTE — Patient Instructions (Signed)
Complete the course of Keflex, taking it four times daily. There is still evidence of athlete's foot.  Please apply the antifungal cream twice daily between all toes.  Be sure to keep the area dry--using powder if needed, changing out of sweaty socks immediately following exercise. Use cotton socks rather than nylon. Wear flip flops when showering at the gym. Check your feet daily--this is the best line of defense in diabetics to find things early on before complications set in (including the athlete's foot)  Return for recheck only if this doesn't completely resolve.  Athlete's Foot Athlete's foot (tinea pedis) is a fungal infection of the skin on the feet. It often occurs on the skin between the toes or underneath the toes. It can also occur on the soles of the feet. Athlete's foot is more likely to occur in hot, humid weather. Not washing your feet or changing your socks often enough can contribute to athlete's foot. The infection can spread from person to person (contagious). CAUSES Athlete's foot is caused by a fungus. This fungus thrives in warm, moist places. Most people get athlete's foot by sharing shower stalls, towels, and wet floors with an infected person. People with weakened immune systems, including those with diabetes, may be more likely to get athlete's foot. SYMPTOMS   Itchy areas between the toes or on the soles of the feet.  White, flaky, or scaly areas between the toes or on the soles of the feet.  Tiny, intensely itchy blisters between the toes or on the soles of the feet.  Tiny cuts on the skin. These cuts can develop a bacterial infection.  Thick or discolored toenails. DIAGNOSIS  Your caregiver can usually tell what the problem is by doing a physical exam. Your caregiver may also take a skin sample from the rash area. The skin sample may be examined under a microscope, or it may be tested to see if fungus will grow in the sample. A sample may also be taken from your  toenail for testing. TREATMENT  Over-the-counter and prescription medicines can be used to kill the fungus. These medicines are available as powders or creams. Your caregiver can suggest medicines for you. Fungal infections respond slowly to treatment. You may need to continue using your medicine for several weeks. PREVENTION   Do not share towels.  Wear sandals in wet areas, such as shared locker rooms and shared showers.  Keep your feet dry. Wear shoes that allow air to circulate. Wear cotton or wool socks. HOME CARE INSTRUCTIONS   Take medicines as directed by your caregiver. Do not use steroid creams on athlete's foot.  Keep your feet clean and cool. Wash your feet daily and dry them thoroughly, especially between your toes.  Change your socks every day. Wear cotton or wool socks. In hot climates, you may need to change your socks 2 to 3 times per day.  Wear sandals or canvas tennis shoes with good air circulation.  If you have blisters, soak your feet in Burow's solution or Epsom salts for 20 to 30 minutes, 2 times a day to dry out the blisters. Make sure you dry your feet thoroughly afterward. SEEK MEDICAL CARE IF:   You have a fever.  You have swelling, soreness, warmth, or redness in your foot.  You are not getting better after 7 days of treatment.  You are not completely cured after 30 days.  You have any problems caused by your medicines. MAKE SURE YOU:   Understand these  instructions.  Will watch your condition.  Will get help right away if you are not doing well or get worse.   This information is not intended to replace advice given to you by your health care provider. Make sure you discuss any questions you have with your health care provider.   Document Released: 02/13/2000 Document Revised: 05/10/2011 Document Reviewed: 08/19/2014 Elsevier Interactive Patient Education [employer].

## 2015-02-06 NOTE — Progress Notes (Signed)
Chief Complaint  Patient presents with  . Follow-up    on staph infection of toes. Right foot worse than right.    He saw Audelia Acton on 12/2 with blisters on his toes.  He had been at the beach and went to urgent care after he noticed a large blister of his great toe on the right. By the time he went to urgent care, he had blisters of both great toes. One was lanced and culture done at the urgent care which came back positive for staphylococcus infection, not MRSA. He was put on Keflex and had seen some improvement.  He saw Audelia Acton after having a new blister form on the 4th toe.   Audelia Acton had called and discussed case with Lennie Odor PA with [facility]. he was advised to increase Keflex to QID, keep leg elevated, begin Lamisil cream OTC in between toes and along heels and soles, can use OTC Hydrocortisone cream for the new bullous not ruptured lesions.  He has been using triple antibiotic ointment on the blisters, and lamisil between the 2nd and 3rd toe only, at bedtime (once daily).  He increased the Keflex to 4/day, and still has another few days left. He finds that the blisters seem to be healing, and that he is getting better.  The blister on the right 4th toe just opened 2 days ago, and he thinks the skin came off with the bandage, so looks red today (he had left the skin intact).  Denies pain, fevers, drainage or crusting.  He continues to exercise daily.  Blood pressures and blood sugars have all been good (he brought in his log, all okay).  PMH, PSH, SH reviewed.  Outpatient Encounter Prescriptions as of 02/06/2015  Medication Sig Note  . aspirin 81 MG tablet Take 81 mg by mouth daily.     . cephALEXin (KEFLEX) 500 MG capsule Take 1 capsule (500 mg total) by mouth 4 (four) times daily.   Marland Kitchen glipiZIDE (GLUCOTROL) 5 MG tablet TAKE 1 TABLET BY MOUTH DAILY BEFORE SUPPER   . glucose blood (FREESTYLE LITE) test strip Use as instructed   . glucose blood (FREESTYLE TEST STRIPS) test  strip 1 each by Other route as needed for other. Use as instructed   . hydrochlorothiazide (HYDRODIURIL) 25 MG tablet TAKE 1 TABLET BY MOUTH EVERY DAY   . Lancets (FREESTYLE) lancets Test daily   . losartan (COZAAR) 50 MG tablet Take 1 tablet (50 mg total) by mouth daily.   . Omega-3 Fatty Acids (FISH OIL) 1000 MG CAPS Take 2 capsules by mouth daily.   . pioglitazone (ACTOS) 45 MG tablet Take 1 tablet (45 mg total) by mouth daily.   . saxagliptin HCl (ONGLYZA) 2.5 MG TABS tablet Take 1 tablet (2.5 mg total) by mouth daily.   . simvastatin (ZOCOR) 20 MG tablet TAKE 1 TABLET BY MOUTH AT BEDTIME   . vardenafil (LEVITRA) 20 MG tablet Take 1 tablet (20 mg total) by mouth daily as needed for erectile dysfunction. 09/03/2013: Uses prn  . valACYclovir (VALTREX) 1000 MG tablet Take 2,000 mg by mouth as needed.  10/10/2014: From her dermatologist [facility]); prn fever blisters   No facility-administered encounter medications on file as of 02/06/2015.   Allergies  Allergen Reactions  . Ace Inhibitors     cough   ROS: no fever, chills, bleeding, bruising, edema, foot pain, URI symptoms, chest pain, shortness of breath, GI or GU complaints. Moods are good.  PHYSICAL EXAM: BP 130/70 mmHg  Pulse  68  Ht 6\' 4"  (1.93 m)  Wt 257 lb (116.574 kg)  BMI 31.30 kg/m2  Pleasant, well-appearing male in no distress   Extremities:  Left great toe--medially and proximally to nail bed is healing blister--there is dried skin that is slightly discolored, but skin underneath is normal color, and dry.   On the right foot--there are healing blisters (again--dried skin, with normal skin underneath) on the great and 2nd toes.  No significant erythema or swelling. The left 4th toe has area of freshly denuded skin, that is pink where the skin is absent, but not surrounding erythema. No crusting. There is some continued maceration between the 2nd and third toes, extending to the lateral aspect of the 2nd toe. Very small area  of maceration also between the 3rd and 4th toes.  Diminished pulses bilaterally.  Brisk capillary refill. No edema  ASSESSMENT/PLAN:  Staph skin infection  Tinea pedis of right foot  Type II diabetes mellitus with nephropathy ([place]) - controlled  Essential hypertension, benign - controlled    Cellulitis, bullous dermatitis--improving/healing. Persistent tinea pedis, inadequately treated.   Complete the course of Keflex, taking it four times daily. There is still evidence of athlete's foot.  Please apply the antifungal cream twice daily between all toes.  Be sure to keep the area dry--using powder if needed, changing out of sweaty socks immediately following exercise. Use cotton socks rather than nylon. Wear flip flops when showering at the gym. Check your feet daily--this is the best line of defense in diabetics to find things early on before complications set in (including the athlete's foot)  Return for recheck only if this doesn't completely resolve.  F/u as scheduled in February, sooner if needed.

## 2015-03-14 ENCOUNTER — Other Ambulatory Visit: Payer: Self-pay | Admitting: Family Medicine

## 2015-03-19 ENCOUNTER — Telehealth: Payer: Self-pay

## 2015-03-19 LAB — HM DIABETES EYE EXAM

## 2015-03-19 NOTE — Telephone Encounter (Signed)
[facility] sent [place] a fax saying this pt insurance will only cover Accu-Check or One Touch. They want [place] to send a new RX for all the supplies.

## 2015-03-19 NOTE — Telephone Encounter (Signed)
Patient uses [facility], will fax new rx for meter, test strips and lancets for a One Touch.

## 2015-03-19 NOTE — Telephone Encounter (Signed)
Not sure which meter he has. Please check with pt and handle this

## 2015-04-09 ENCOUNTER — Encounter: Payer: Self-pay | Admitting: Family Medicine

## 2015-04-09 ENCOUNTER — Ambulatory Visit (INDEPENDENT_AMBULATORY_CARE_PROVIDER_SITE_OTHER): Payer: Medicare Other | Admitting: Family Medicine

## 2015-04-09 VITALS — BP 140/70 | HR 64 | Ht 75.75 in | Wt 247.8 lb

## 2015-04-09 DIAGNOSIS — E291 Testicular hypofunction: Secondary | ICD-10-CM | POA: Diagnosis not present

## 2015-04-09 DIAGNOSIS — N182 Chronic kidney disease, stage 2 (mild): Secondary | ICD-10-CM

## 2015-04-09 DIAGNOSIS — I10 Essential (primary) hypertension: Secondary | ICD-10-CM | POA: Diagnosis not present

## 2015-04-09 DIAGNOSIS — Z1159 Encounter for screening for other viral diseases: Secondary | ICD-10-CM | POA: Diagnosis not present

## 2015-04-09 DIAGNOSIS — N529 Male erectile dysfunction, unspecified: Secondary | ICD-10-CM

## 2015-04-09 DIAGNOSIS — Z0000 Encounter for general adult medical examination without abnormal findings: Secondary | ICD-10-CM

## 2015-04-09 DIAGNOSIS — E1121 Type 2 diabetes mellitus with diabetic nephropathy: Secondary | ICD-10-CM

## 2015-04-09 DIAGNOSIS — E785 Hyperlipidemia, unspecified: Secondary | ICD-10-CM | POA: Diagnosis not present

## 2015-04-09 DIAGNOSIS — E119 Type 2 diabetes mellitus without complications: Secondary | ICD-10-CM | POA: Diagnosis not present

## 2015-04-09 DIAGNOSIS — E669 Obesity, unspecified: Secondary | ICD-10-CM | POA: Diagnosis not present

## 2015-04-09 DIAGNOSIS — N189 Chronic kidney disease, unspecified: Secondary | ICD-10-CM | POA: Diagnosis not present

## 2015-04-09 DIAGNOSIS — Z23 Encounter for immunization: Secondary | ICD-10-CM | POA: Diagnosis not present

## 2015-04-09 LAB — POCT URINALYSIS DIPSTICK
Bilirubin, UA: NEGATIVE
Blood, UA: NEGATIVE
Glucose, UA: NEGATIVE
Ketones, UA: NEGATIVE
Leukocytes, UA: NEGATIVE
Nitrite, UA: NEGATIVE
Protein, UA: NEGATIVE
Spec Grav, UA: >=1.03
Urobilinogen, UA: NEGATIVE
pH, UA: 5.5

## 2015-04-09 LAB — LIPID PANEL
Cholesterol: 125 mg/dL (ref 125–200)
HDL: 55 mg/dL (ref >=40)
LDL Cholesterol: 60 mg/dL (ref <130)
Total CHOL/HDL Ratio: 2.3 Ratio (ref <=5.0)
Triglycerides: 51 mg/dL (ref <150)
VLDL: 10 mg/dL (ref <30)

## 2015-04-09 LAB — COMPREHENSIVE METABOLIC PANEL
ALT: 14 U/L (ref 9–46)
AST: 16 U/L (ref 10–35)
Albumin: 3.8 g/dL (ref 3.6–5.1)
Alkaline Phosphatase: 35 U/L — ABNORMAL LOW (ref 40–115)
BUN: 28 mg/dL — ABNORMAL HIGH (ref 7–25)
CO2: 24 mmol/L (ref 20–31)
Calcium: 9.2 mg/dL (ref 8.6–10.3)
Chloride: 99 mmol/L (ref 98–110)
Creat: 2.02 mg/dL — ABNORMAL HIGH (ref 0.70–1.25)
Glucose, Bld: 154 mg/dL — ABNORMAL HIGH (ref 65–99)
Potassium: 4.4 mmol/L (ref 3.5–5.3)
Sodium: 136 mmol/L (ref 135–146)
Total Bilirubin: 0.6 mg/dL (ref 0.2–1.2)
Total Protein: 6.7 g/dL (ref 6.1–8.1)

## 2015-04-09 LAB — POCT GLYCOSYLATED HEMOGLOBIN (HGB A1C): Hemoglobin A1C: 6.6

## 2015-04-09 LAB — TESTOSTERONE: Testosterone: 382 ng/dL (ref 250–827)

## 2015-04-09 LAB — TSH: TSH: 1.99 mIU/L (ref 0.40–4.50)

## 2015-04-09 MED ORDER — VARDENAFIL HCL 20 MG PO TABS: 20.0000 mg | ORAL_TABLET | Freq: Every day | ORAL | Status: DC | PRN

## 2015-04-09 MED ORDER — PIOGLITAZONE HCL 45 MG PO TABS: 45.0000 mg | ORAL_TABLET | Freq: Every day | ORAL | Status: DC

## 2015-04-09 MED ORDER — SIMVASTATIN 20 MG PO TABS: ORAL_TABLET | ORAL | Status: DC

## 2015-04-09 MED ORDER — LOSARTAN POTASSIUM 50 MG PO TABS: 50.0000 mg | ORAL_TABLET | Freq: Every day | ORAL | Status: DC

## 2015-04-09 MED ORDER — GLIPIZIDE 5 MG PO TABS: ORAL_TABLET | ORAL | Status: DC

## 2015-04-09 MED ORDER — SAXAGLIPTIN HCL 2.5 MG PO TABS: 2.5000 mg | ORAL_TABLET | Freq: Every day | ORAL | Status: DC

## 2015-04-09 MED ORDER — HYDROCHLOROTHIAZIDE 25 MG PO TABS: ORAL_TABLET | ORAL | Status: DC

## 2015-04-09 NOTE — Progress Notes (Signed)
Chief Complaint  Patient presents with  . Medicare Wellness    fasting CPE. Had to get new glucometer in Jan whe his ins changed, numbers began to increase with new meter, he is concerned. FBS this am was 156, and was 108 after dinner last night which was a dry salad. Did not do eye exam as he saw Dr.Tanner last week.   . EKG    hasn't had an EKG in 2 years, wondered if you wanted to do one. Not having any issue.    James Glover is a 70 y.o. male who presents for complete physical, annual wellness visit and follow-up on chronic medical conditions.  He has the following concerns:  Rash 2 months ago on the right lower buttock and posterior thigh.  He thought it may have been jock itch, as he has this flare periodically.  He treated it with Lotrimin, itching resolved, but he still notices a discoloration.  Patient presents to follow up on diabetes. Morning sugars are running 84-139 (yesterday morning was 156), usually 115-130. Evening sugars (before bedtime, 2-3 hours after eating) are 74-160, mostly 90's-130. Denies hypoglycemia, polydipsia, polyuria.Denies any numbness, tingling, lesions/sores.  Diabetic eye exam was last month (03/2015), no reported changes per pt. He continues to exercise daily (see below).  He cut back on carbs, eating only 2-3 times/week (potato, corn, rice), continues to avoid sweets, sugar and limit portions.  He lost just under 10# since his last visit here.  He believes his sugars have been slightly higher since getting new monitor.  Hypertension follow-up: Blood pressures in the morning, before exercise runs 99-135/52-70 (mostly 100-120/60). Evening BP's are 92-139/55-69, mostly 110-125/60. Denies dizziness (even when systolic BP is low), headaches, chest pain. Denies side effects of medications.  Hypogonadism--he had developed high testosterone levels when only on 1 pump; medication was stopped and testosterone was rechecked in 01/2014 (off meds) and level was  376.continues to have some ED, relieved by Levitra.  Libido is okay, denies fatigue.  Immunization History  Administered Date(s) Administered  . DTaP 07/25/2008  . Influenza Split 11/17/2011, 12/30/2013, 12/26/2014  . Influenza, High Dose Seasonal PF 01/30/2013  . Pneumococcal Conjugate-13 03/04/2014  . Pneumococcal Polysaccharide-23 11/29/2009  . Tdap 07/25/2008  . Zoster 09/05/2013   Last colonoscopy: 05/05/10  Last PSA: 09/2014, normal  Dentist: goes 2-3 times per year, got implant in September 2015 Ophtho: he goes yearly, last 03/2015  Exercise: walking 8 miles daily on combination of treadmill, bicycle and elliptical along with weights daily. When at the beach, he walks 2 miles/day (one week/month).  Other doctors caring for patient include: Dentist: Dr. Mauro Kaufmann Ophtho: Dr. Satira Sark ([place] ophtho) GI: Dr. Collene Mares  Nephro: Dr. Marval Regal (seeing him every 8 months now) Cardiologist: Dr. Claiborne Billings Dermatologist: Dr. Tonia Brooms  Depression screen:  Negative Fall screen: negative Functional Status Screen: negative--see full screen in epic.  End of Life Discussion:  Patient does not have a living will and medical power of attorney. He has been told about this the last few years, but still hasn't done, and no longer has the forms given last year.  Past Medical History  Diagnosis Date  . Diabetes mellitus   . Hyperlipidemia   . Hypertension   . GERD (gastroesophageal reflux disease)   . Hemorrhoids     internal and external  . Diverticulosis   . Hay fever   . Hypogonadism male   . Erectile dysfunction   . Chronic kidney disease   . Chronic renal insufficiency,  stage II (mild)   . Coronary artery disease     (?pt denies, not mentioned in Dr. Lowella Fairy notes 2014)    Past Surgical History  Procedure Laterality Date  . Circumcision  age 89  . Dental surgery      implants    Social History   Social History  . Marital Status: Married    Spouse Name: N/A  . Number of  Children: 0  . Years of Education: N/A   Occupational History  . 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 Smoking status: Never Smoker   . Smokeless tobacco: Never Used  . Alcohol Use: 0.0 oz/week    0 Standard drinks or equivalent per week     Comment: glass of wine once a week  . Drug Use: No  . Sexual Activity: Not on file   Other Topics Concern  . Not on file   Social History Narrative   Retired 06/2011.  Lives with wife, 1 cat    Family History  Problem Relation Age of Onset  . Lymphoma Father   . Cancer Father     lymphoma  . Hypertension Father   . Lymphoma Brother   . Cancer Brother     lymphoma  . Lymphoma Paternal Grandfather   . Cancer Paternal Grandfather     lymphoma  . Dementia Mother   . Diabetes Mother   . Diabetes Sister   . Heart disease Neg Hx   . Cancer Brother     skin cancer  . Diabetes Brother     Outpatient Encounter Prescriptions as of 04/09/2015  Medication Sig Note  . aspirin 81 MG tablet Take 81 mg by mouth daily.     Marland Kitchen glipiZIDE (GLUCOTROL) 5 MG tablet TAKE 1 TABLET BY MOUTH DAILY BEFORE SUPPER   . hydrochlorothiazide (HYDRODIURIL) 25 MG tablet TAKE 1 TABLET BY MOUTH EVERY DAY   . losartan (COZAAR) 50 MG tablet Take 1 tablet (50 mg total) by mouth daily.   . Omega-3 Fatty Acids (FISH OIL) 1000 MG CAPS Take 2 capsules by mouth daily.   Glory Rosebush DELICA LANCETS FINE MISC U UTD 04/09/2015: Received from: External Pharmacy  . pioglitazone (ACTOS) 45 MG tablet Take 1 tablet (45 mg total) by mouth daily.   . saxagliptin HCl (ONGLYZA) 2.5 MG TABS tablet Take 1 tablet (2.5 mg total) by mouth daily.   . simvastatin (ZOCOR) 20 MG tablet TAKE 1 TABLET BY MOUTH AT BEDTIME   . vardenafil (LEVITRA) 20 MG tablet Take 1 tablet (20 mg total) by mouth daily as needed for erectile dysfunction. 09/03/2013: Uses prn  . valACYclovir (VALTREX) 1000 MG tablet Take 2,000 mg by mouth as needed. Reported on 04/09/2015 10/10/2014:  From her dermatologist [facility]); prn fever blisters  . [DISCONTINUED] cephALEXin (KEFLEX) 500 MG capsule Take 1 capsule (500 mg total) by mouth 4 (four) times daily.   . [DISCONTINUED] FREESTYLE LITE test strip USE AS DIRECTED    No facility-administered encounter medications on file as of 04/09/2015.    Allergies  Allergen Reactions  . Ace Inhibitors     cough   ROS: The patient denies anorexia, fever, headaches, vision loss, decreased hearing, ear pain, hoarseness, chest pain, palpitations, dizziness, syncope, dyspnea on exertion, cough, swelling, nausea, vomiting, diarrhea, constipation, abdominal pain, melena, hematochezia, indigestion/heartburn, hematuria, incontinence, weakened urine stream, dysuria, genital lesions, joint pains, numbness, tingling, weakness, tremor, suspicious skin lesions, depression, anxiety, abnormal bleeding/bruising, or enlarged lymph nodes.  Up 2-3 times/night to void. No urinary hesitancy. He occasionally gets jock itch, not currently, but he is reporting a rash on the right buttock and thigh area. +intentional weight loss +ED, as per HPI   PHYSICAL EXAM:  BP 140/70 mmHg  Pulse 64  Ht 6' 3.75" (1.924 m)  Wt 247 lb 12.8 oz (112.401 kg)  BMI 66.06 kg/m2 301 systolic on right arm-- (I could hear pulse all the way down, so couldn't determine diastolic).  General Appearance:  Alert, cooperative, no distress, appears stated age   Head:  Normocephalic, without obvious abnormality, atraumatic   Eyes:  PERRL, conjunctiva/corneas clear, EOM's intact, fundi  benign   Ears:  Normal TM's and external ear canals; nonocclusive cerumen noted in the left canal, partially obscuring the left TM  Nose:  Nares normal, mucosa normal, no drainage or sinus tenderness   Throat:  Lips, mucosa, and tongue normal; teeth and gums normal   Neck:  Supple, no lymphadenopathy; thyroid: no enlargement/tenderness/nodules; no carotid  bruit or JVD    Back:  Spine nontender, no curvature, ROM normal, no CVA tenderness   Lungs:  Clear to auscultation bilaterally without wheezes, rales or ronchi; respirations unlabored   Chest Wall:  No tenderness or deformity   Heart:  Regular rate and rhythm, S1 and S2 normal, no murmur, rub  or gallop   Breast Exam:  No chest wall tenderness, masses or gynecomastia   Abdomen:  Soft, non-tender, nondistended, normoactive bowel sounds,  no masses, no hepatosplenomegaly. Mild ventral hernia, nontender.  Genitalia:  Normal male external genitalia without lesions. + penile piercing. Testicles without masses. No inguinal hernias.   Rectal:  Normal sphincter tone, no masses or tenderness; guaiac negative stool. Prostate smooth, no nodules, not enlarged.   Extremities:  No clubbing, cyanosis; no edema  Pulses:  2+ and symmetric all extremities   Skin:  Skin texture, turgor normal, no rashes or lesions. Normal diabetic foot exam. Confluent area of mild hyperpigmentation at the right lower buttock extending into upper right posterior thigh. Slight hyperpigmentation also noted at inferior left buttocks  No erythema or other abnormality noted, flat.  Lymph nodes:  Cervical, supraclavicular, and axillary nodes normal   Neurologic:  CNII-XII intact, normal strength, sensation and gait; reflexes 2+ and symmetric throughout    Psych: Normal mood, affect, hygiene and grooming       Diabetic foot exam--onychomycosis more prominent (and in all nails) on the left, less on the right.  Feet are cold, but 2+ DP and PT pulses.  Lab Results  Component Value Date   HGBA1C 6.6 04/09/2015  normal urine dip today   ASSESSMENT/PLAN:  Annual physical exam - Plan: POCT Urinalysis Dipstick  Dyslipidemia - at goal on current regimen - Plan: simvastatin (ZOCOR) 20 MG tablet, Lipid panel, Comprehensive metabolic panel  Type 2 diabetes with nephropathy  ([place]) - well controlled on current regimen.Nephropathy--stable.  Dr. Lurline Del last note reviewed. - Plan: saxagliptin HCl (ONGLYZA) 2.5 MG TABS tablet, pioglitazone (ACTOS) 45 MG tablet, glipiZIDE (GLUCOTROL) 5 MG tablet, Comprehensive metabolic panel, TSH, Microalbumin / creatinine urine ratio  Erectile dysfunction, unspecified erectile dysfunction type - Plan: vardenafil (LEVITRA) 20 MG tablet  Diabetes mellitus without complication ([place]) - Plan: HgB A1c  Essential hypertension, benign - well controlled - Plan: losartan (COZAAR) 50 MG tablet, hydrochlorothiazide (HYDRODIURIL) 25 MG tablet, Comprehensive metabolic panel  Male hypogonadism - Plan: Testosterone  Obesity (BMI 30.0-34.9)  Chronic renal insufficiency, stage II (mild)  Need for hepatitis C screening  test - Plan: Hepatitis C antibody  Immunization due - Plan: Pneumococcal polysaccharide vaccine 23-valent greater than or equal to 2yo subcutaneous/IM   Lipid, c-met, urine microalbumin, TSH, testosterone  Recommended at least 30 minutes of aerobic activity at least 5 days/week, weight-bearing exercise at least 2x/week; proper sunscreen use reviewed; healthy diet and alcohol recommendations (less than or equal to 2 drinks/day) reviewed; regular seatbelt use; changing batteries in smoke detectors. Self-testicular exams. Immunization recommendations discussed--Pneumovax today (first dose given prior to age 36). Colonoscopy recommendations reviewed, UTD  Reviewed living will and healthcare power of attorney info again--he was given copies of paperwork again. Encouraged to fill out; counseled extensively re: reasons to discuss with wife, and that she should also fill out.  Reviewed s/sx of hypoglycemia. Slightly unusual that he is just one once daily (prior to dinner) glipizide, not extended release.  Can consider doing 1/2 tablet twice daily if seeing large fluctuations in blood sugars; however appears to be overall very well controlled, so  continue the same regimen he has been on for quite some time.   Send copies of labs to Dr. Camila Li Attestation I have personally reviewed: The patient's medical and social history Their use of alcohol, tobacco or illicit drugs Their current medications and supplements The patient's functional ability including ADLs,fall risks, home safety risks, cognitive, and hearing and visual impairment Diet and physical activities Evidence for depression or mood disorders  The patient's weight, height, and BMI have been recorded in the chart.  I have made referrals, counseling, and provided education to the patient based on review of the above and I have provided the patient with a written personalized care plan for preventive services.     KNAPP,EVE A, MD   04/09/2015

## 2015-04-09 NOTE — Patient Instructions (Addendum)
  HEALTH MAINTENANCE RECOMMENDATIONS:  It is recommended that you get at least 30 minutes of aerobic exercise at least 5 days/week (for weight loss, you may need as much as 60-90 minutes). This can be any activity that gets your heart rate up. This can be divided in 10-15 minute intervals if needed, but try and build up your endurance at least once a week.  Weight bearing exercise is also recommended twice weekly.  Eat a healthy diet with lots of vegetables, fruits and fiber.  "Colorful" foods have a lot of vitamins (ie green vegetables, tomatoes, red peppers, etc).  Limit sweet tea, regular sodas and alcoholic beverages, all of which has a lot of calories and sugar.  Up to 2 alcoholic drinks daily may be beneficial for men (unless trying to lose weight, watch sugars).  Drink a lot of water.  Sunscreen of at least SPF 30 should be used on all sun-exposed parts of the skin when outside between the hours of 10 am and 4 pm (not just when at beach or pool, but even with exercise, golf, tennis, and yard work!)  Use a sunscreen that says "broad spectrum" so it covers both UVA and UVB rays, and make sure to reapply every 1-2 hours.  Remember to change the batteries in your smoke detectors when changing your clock times in the spring and fall.  Use your seat belt every time you are in a car, and please drive safely and not be distracted with cell phones and texting while driving.    James Glover , Thank you for taking time to come for your Medicare Wellness Visit. I appreciate your ongoing commitment to your health goals. Please review the following plan we discussed and let me know if I can assist you in the future.   These are the goals we discussed: Goals    None      This is a list of the screening recommended for you and due dates:  Health Maintenance  Topic Date Due  .  Hepatitis C: One time screening is recommended by Center for Disease Control  (CDC) for  adults born from [birth year] through 1964.    Jun 10, 1945  . Eye exam for diabetics  05/22/1955  . Complete foot exam   03/05/2015  . Pneumonia vaccines (2 of 2 - PPSV23) 03/05/2015  . Hemoglobin A1C  04/12/2015  . Flu Shot  09/30/2015  . Tetanus Vaccine  07/26/2018  . Colon Cancer Screening  05/14/2020  . Shingles Vaccine  Completed    Please call the eye doctor and make sure they send [place] a copy of your diabetic eye exam.  You are not past due, we just don't have the documentation. Your hepatitis C screening is being done today. Your foot exam is being done today Your pneumovax is being given today Your hemoglobin A1c was done today.  Please look at the Living Will and healthcare power of attorney paperwork you were given, fill out, and get [place] a copy at your convenience.

## 2015-04-10 LAB — MICROALBUMIN / CREATININE URINE RATIO
Creatinine, Urine: 155 mg/dL (ref 20–370)
Microalb Creat Ratio: 2 mcg/mg creat (ref <30)
Microalb, Ur: 0.3 mg/dL

## 2015-04-10 LAB — HEPATITIS C ANTIBODY: HCV Ab: NEGATIVE

## 2015-04-14 ENCOUNTER — Encounter: Payer: Medicare PPO | Admitting: Family Medicine

## 2015-06-19 ENCOUNTER — Encounter: Payer: Self-pay | Admitting: Family Medicine

## 2015-10-11 ENCOUNTER — Other Ambulatory Visit: Payer: Self-pay | Admitting: Family Medicine

## 2015-10-11 DIAGNOSIS — E1121 Type 2 diabetes mellitus with diabetic nephropathy: Secondary | ICD-10-CM

## 2015-10-21 ENCOUNTER — Other Ambulatory Visit: Payer: Self-pay | Admitting: Family Medicine

## 2015-10-21 DIAGNOSIS — I10 Essential (primary) hypertension: Secondary | ICD-10-CM

## 2015-10-27 ENCOUNTER — Other Ambulatory Visit: Payer: Self-pay | Admitting: Family Medicine

## 2015-10-27 DIAGNOSIS — E1121 Type 2 diabetes mellitus with diabetic nephropathy: Secondary | ICD-10-CM

## 2015-11-03 ENCOUNTER — Other Ambulatory Visit: Payer: Self-pay | Admitting: Family Medicine

## 2015-11-03 DIAGNOSIS — I10 Essential (primary) hypertension: Secondary | ICD-10-CM

## 2015-11-04 NOTE — Telephone Encounter (Signed)
Pt has an appt coming up this month. Refilling for 30 days

## 2015-11-11 NOTE — Progress Notes (Signed)
Chief Complaint  Patient presents with  . Diabetes    fasting med check. No concerns.     Patient presents to follow up on diabetes. Morning sugars are running 81-154, usually 100-126.Evening sugars (before bedtime, 2-3 hours after eating) are 83-137, mostly 85-120. Marland Kitchen Denies hypoglycemia, polydipsia, polyuria.Denies any numbness, tingling, lesions/sores.  Diabetic eye exam was 03/2015, no reported changes per pt. He continues to exercise daily.  He cut back on carbs, cut out potatoes entirely, continues to avoid sweets, sugar and limit portions.   Hypertension follow-up: Blood pressures throughout the day runs 105-131/52-62, mostly 120-125/upper 50's.Denies dizziness, headaches, chest pain. Denies side effects of medications.  Hypogonadism--he had developed high testosterone levels when only on 1 pump; medication was stopped and testosterone was rechecked in 01/2014 (off meds) and level was 376.continues to have some ED, relieved by Levitra.  Libido is okay (less than when younger), denies fatigue. Last testosterone level was 382 in 04/2015.  Sees Dr. Arty Baumgartner every 8 months, stable and doing well.  PMH, PSH, SH reviewed  Outpatient Encounter Prescriptions as of 11/12/2015  Medication Sig Note  . aspirin 81 MG tablet Take 81 mg by mouth daily.     Marland Kitchen glipiZIDE (GLUCOTROL) 5 MG tablet TAKE 1 TABLET BY MOUTH DAILY BEFORE SUPPER   . hydrochlorothiazide (HYDRODIURIL) 25 MG tablet Take 1 tablet (25 mg total) by mouth daily.   Marland Kitchen losartan (COZAAR) 50 MG tablet TAKE 1 TABLET(50 MG) BY MOUTH DAILY   . Omega-3 Fatty Acids (FISH OIL) 1000 MG CAPS Take 2 capsules by mouth daily.   Glory Rosebush DELICA LANCETS FINE MISC U UTD 04/09/2015: Received from: External Pharmacy  . pioglitazone (ACTOS) 45 MG tablet TAKE 1 TABLET(45 MG) BY MOUTH DAILY   . saxagliptin HCl (ONGLYZA) 2.5 MG TABS tablet Take 1 tablet (2.5 mg total) by mouth daily.   . simvastatin (ZOCOR) 20 MG tablet TAKE 1 TABLET BY MOUTH AT  BEDTIME   . vardenafil (LEVITRA) 20 MG tablet Take 1 tablet (20 mg total) by mouth daily as needed for erectile dysfunction.   . [DISCONTINUED] glipiZIDE (GLUCOTROL) 5 MG tablet TAKE 1 TABLET BY MOUTH DAILY BEFORE SUPPER   . [DISCONTINUED] hydrochlorothiazide (HYDRODIURIL) 25 MG tablet TAKE 1 TABLET BY MOUTH EVERY DAY   . [DISCONTINUED] losartan (COZAAR) 50 MG tablet TAKE 1 TABLET(50 MG) BY MOUTH DAILY   . [DISCONTINUED] pioglitazone (ACTOS) 45 MG tablet TAKE 1 TABLET(45 MG) BY MOUTH DAILY   . [DISCONTINUED] saxagliptin HCl (ONGLYZA) 2.5 MG TABS tablet Take 1 tablet (2.5 mg total) by mouth daily.   . [DISCONTINUED] simvastatin (ZOCOR) 20 MG tablet TAKE 1 TABLET BY MOUTH AT BEDTIME   . valACYclovir (VALTREX) 1000 MG tablet Take 2,000 mg by mouth as needed. Reported on 04/09/2015 10/10/2014: From her dermatologist [facility]); prn fever blisters   No facility-administered encounter medications on file as of 11/12/2015.    Allergies  Allergen Reactions  . Ace Inhibitors     cough   ROS: no fever, chills, headaches, dizziness, chest pain, palpitations, nausea, vomiting, bowel changes, skin concerns, bleeding, bruising, rash.  Slight sneezing from allergies. No cough, shortness of breath.   PHYSICAL EXAM:  BP 132/70 (BP Location: Left Arm, Patient Position: Sitting, Cuff Size: Normal)   Pulse 72   Ht 6' 3.5" (1.918 m)   Wt 252 lb (114.3 kg)   BMI 31.08 kg/m   Well developed, pleasant male in no distress Neck: no lymphadenopathy, thyromegaly or mass, no bruit. Heart: regular rate and  rhythm Lungs: clear bilaterally Extremities: no edema, 2+ pulses Skin: no rash. AKs on LE's bilaterally.  Very tanned skin (except forehead, and top of nose/under eyes, from hat/sunglasses). Neuro: alert and oriented. Cranial nerves intact. Normal strength, gait Psych: normal mood, affect, hygiene and grooming  Lab Results  Component Value Date   HGBA1C 6.3 11/12/2015     ASSESSMENT/PLAN:  Type 2  diabetes with nephropathy ([place]) - diabetes is controlled, no hypoglycemia; continue current management - Plan: HgB A1c, Comprehensive metabolic panel, glipiZIDE (GLUCOTROL) 5 MG tablet, pioglitazone (ACTOS) 45 MG tablet, saxagliptin HCl (ONGLYZA) 2.5 MG TABS tablet  Dyslipidemia - lipids at goal on last check; continue current regimen - Plan: Comprehensive metabolic panel, simvastatin (ZOCOR) 20 MG tablet  Essential hypertension, benign - well controlled - Plan: Comprehensive metabolic panel, hydrochlorothiazide (HYDRODIURIL) 25 MG tablet, losartan (COZAAR) 50 MG tablet  Need for prophylactic vaccination and inoculation against influenza - Plan: Flu vaccine HIGH DOSE PF (Fluzone High dose)  Medication monitoring encounter - Plan: Comprehensive metabolic panel  Screening for prostate cancer - Plan: PSA   F/u with Dr. Renda Rolls for treatment of AK's. Proper sunscreen reviewed.    c-met, PSA Lipids fine on last check,no need to repeat   F/u 6 months for CPE/AWV/med check

## 2015-11-12 ENCOUNTER — Encounter: Payer: Self-pay | Admitting: Family Medicine

## 2015-11-12 ENCOUNTER — Ambulatory Visit (INDEPENDENT_AMBULATORY_CARE_PROVIDER_SITE_OTHER): Payer: Medicare Other | Admitting: Family Medicine

## 2015-11-12 ENCOUNTER — Other Ambulatory Visit: Payer: Self-pay | Admitting: Family Medicine

## 2015-11-12 VITALS — BP 132/70 | HR 72 | Ht 75.5 in | Wt 252.0 lb

## 2015-11-12 DIAGNOSIS — Z125 Encounter for screening for malignant neoplasm of prostate: Secondary | ICD-10-CM

## 2015-11-12 DIAGNOSIS — Z5181 Encounter for therapeutic drug level monitoring: Secondary | ICD-10-CM | POA: Diagnosis not present

## 2015-11-12 DIAGNOSIS — I10 Essential (primary) hypertension: Secondary | ICD-10-CM

## 2015-11-12 DIAGNOSIS — E785 Hyperlipidemia, unspecified: Secondary | ICD-10-CM | POA: Diagnosis not present

## 2015-11-12 DIAGNOSIS — Z23 Encounter for immunization: Secondary | ICD-10-CM

## 2015-11-12 DIAGNOSIS — E1121 Type 2 diabetes mellitus with diabetic nephropathy: Secondary | ICD-10-CM

## 2015-11-12 LAB — COMPREHENSIVE METABOLIC PANEL
ALT: 13 U/L (ref 9–46)
AST: 15 U/L (ref 10–35)
Albumin: 4.2 g/dL (ref 3.6–5.1)
Alkaline Phosphatase: 36 U/L — ABNORMAL LOW (ref 40–115)
BUN: 23 mg/dL (ref 7–25)
CO2: 23 mmol/L (ref 20–31)
Calcium: 9.1 mg/dL (ref 8.6–10.3)
Chloride: 103 mmol/L (ref 98–110)
Creat: 1.79 mg/dL — ABNORMAL HIGH (ref 0.70–1.25)
Glucose, Bld: 127 mg/dL — ABNORMAL HIGH (ref 65–99)
Potassium: 4.4 mmol/L (ref 3.5–5.3)
Sodium: 137 mmol/L (ref 135–146)
Total Bilirubin: 0.7 mg/dL (ref 0.2–1.2)
Total Protein: 6.8 g/dL (ref 6.1–8.1)

## 2015-11-12 LAB — POCT GLYCOSYLATED HEMOGLOBIN (HGB A1C): Hemoglobin A1C: 6.3

## 2015-11-12 MED ORDER — HYDROCHLOROTHIAZIDE 25 MG PO TABS: 25.0000 mg | ORAL_TABLET | Freq: Every day | ORAL | 5 refills | Status: DC

## 2015-11-12 MED ORDER — GLIPIZIDE 5 MG PO TABS: ORAL_TABLET | ORAL | 5 refills | Status: DC

## 2015-11-12 MED ORDER — SAXAGLIPTIN HCL 2.5 MG PO TABS: 2.5000 mg | ORAL_TABLET | Freq: Every day | ORAL | 5 refills | Status: DC

## 2015-11-12 MED ORDER — LOSARTAN POTASSIUM 50 MG PO TABS: ORAL_TABLET | ORAL | 5 refills | Status: DC

## 2015-11-12 MED ORDER — SIMVASTATIN 20 MG PO TABS: ORAL_TABLET | ORAL | 5 refills | Status: DC

## 2015-11-12 MED ORDER — PIOGLITAZONE HCL 45 MG PO TABS: ORAL_TABLET | ORAL | 5 refills | Status: DC

## 2015-11-12 NOTE — Patient Instructions (Signed)
Continue same medications. Blood pressure and sugars are well controlled. Contact us if your sugars are below 60-70 frequently or symptoms of low blood sugar develop.  Follow up with Dr. Renda Rolls for routine screening--you have evidence of actinic keratoses (precancerous lesions on the legs). Use SPF of at least 30 to ALL sunexposed areas of skin, and reapply every 2 hours. Get "broad spectrum" to cover both UVA and UVB rays.

## 2015-11-13 LAB — PSA: PSA: 2.9 ng/mL (ref <=4.0)

## 2016-03-14 ENCOUNTER — Other Ambulatory Visit: Payer: Self-pay | Admitting: Family Medicine

## 2016-06-04 ENCOUNTER — Other Ambulatory Visit: Payer: Self-pay | Admitting: Family Medicine

## 2016-06-04 DIAGNOSIS — I10 Essential (primary) hypertension: Secondary | ICD-10-CM

## 2016-06-04 NOTE — Telephone Encounter (Signed)
Pt has an appt April 25. Will give 30 days

## 2016-06-07 ENCOUNTER — Other Ambulatory Visit: Payer: Self-pay | Admitting: Family Medicine

## 2016-06-07 DIAGNOSIS — E1121 Type 2 diabetes mellitus with diabetic nephropathy: Secondary | ICD-10-CM

## 2016-06-09 ENCOUNTER — Other Ambulatory Visit: Payer: Self-pay | Admitting: Family Medicine

## 2016-06-09 DIAGNOSIS — I10 Essential (primary) hypertension: Secondary | ICD-10-CM

## 2016-06-22 NOTE — Progress Notes (Signed)
Chief Complaint  Patient presents with  . Medicare Wellness    fasting CPE. no concerns. hasnt see eye doctor in over 1 yr. checks sugar daily 100-160s. checks BP daily avg 109/59 to 125/60.    James Glover is a 71 y.o. male who presents for annual wellness visit and follow-up on chronic medical conditions.    Patient presents to follow up on diabetes. Morning sugars are running 103-164, mostly 120-140. 2 hours after dinner or bedtime 84-138, mostly 90-120. He is concerned that his morning sugars are higher than his evening sugars.  Denies hypoglycemia, polydipsia, polyuria.Denies any numbness, tingling, lesions/sores. Diabetic eye exam was 03/2015, past due. He continues to exercise daily. He cut back on carbs, no longer eats much potatoes (1/week), continues to avoid sweets, sugar and limit portions.   Hypertension follow-up: Blood pressures are running 99-128/48-65 (lowest after exercising). Denies dizziness, headaches, chest pain. Denies side effects of medications.  H/o hypogonadism--he had developed high testosterone levels when only on 1 pump; medication was stopped and testosterone was rechecked in 01/2014 (off meds) and level was 376.continues to have some ED, relieved by Levitra. Libido is okay (less than when younger, unchanged from recent visits), denies fatigue. Denies fatigue. Last testosterone level was 382 in 04/2015.  Sees Dr. Arty Baumgartner regularly, last in January, due again in August; stable and doing well.  Immunization History  Administered Date(s) Administered  . DTaP 07/25/2008  . Influenza Split 11/17/2011, 12/30/2013, 12/26/2014  . Influenza, High Dose Seasonal PF 01/30/2013, 11/12/2015  . Pneumococcal Conjugate-13 03/04/2014  . Pneumococcal Polysaccharide-23 11/29/2009, 04/09/2015  . Tdap 07/25/2008  . Zoster 09/05/2013   Last colonoscopy: 05/05/10  Last PSA: 10/2015, normal  Dentist: goes 2-3 times per year, got implant in September 2015 Ophtho: he  goes yearly, last 03/2015 (past due) Exercise: walking 8 miles daily on combination of treadmill, bicycle and elliptical along with weights daily. When at the beach, he walks 2.5 miles/day (10-14 days/month).   Other doctors caring for patient include: Dentist: Dr. Mauro Kaufmann (retired recently, hasn't seen new one yet) Ophtho: Dr. Satira Sark ([place] [facility]) GI: Dr. Collene Mares  Nephro: Dr. Marval Regal  Cardiologist: Dr. Claiborne Billings Dermatologist: Dr. Renda Rolls  Depression screen:  Negative Fall screen: negative Functional Status Screen: negative--see full screen in epic.  End of Life Discussion:  Patient does not have a living will and medical power of attorney. He has been told about this the last few years, but still hasn't done.  "I'm scared".  Past Medical History:  Diagnosis Date  . Chronic kidney disease   . Chronic renal insufficiency, stage II (mild)   . Coronary artery disease    (?pt denies, not mentioned in Dr. Lowella Fairy notes 2014)  . Diabetes mellitus   . Diverticulosis   . Erectile dysfunction   . GERD (gastroesophageal reflux disease)   . Hay fever   . Hemorrhoids    internal and external  . Hyperlipidemia   . Hypertension   . Hypogonadism male     Past Surgical History:  Procedure Laterality Date  . CIRCUMCISION  age 12  . DENTAL SURGERY     implants    Social History   Social History  . Marital status: Married    Spouse name: N/A  . Number of children: 0  . Years of education: N/A   Occupational History  . 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 Smoking status: Never Smoker  . Smokeless tobacco: Never Used  .  Alcohol use 0.0 oz/week     Comment: 3-4 glasses of wine once a week  . Drug use: No  . Sexual activity: Not on file   Other Topics Concern  . Not on file   Social History Narrative   Retired 06/2011.  Lives with wife, 1 cat.   Has a place on [place], spends 1-2 weeks/month there    Family History   Problem Relation Age of Onset  . Lymphoma Father   . Cancer Father     lymphoma  . Hypertension Father   . Lymphoma Brother   . Cancer Brother     lymphoma  . Dementia Mother   . Diabetes Mother   . Diabetes Sister   . Cancer Brother     skin cancer  . Diabetes Brother   . Lymphoma Paternal Grandfather   . Cancer Paternal Grandfather     lymphoma  . Diabetes Brother   . Kidney disease Brother   . COPD Brother   . Heart disease Neg Hx     Outpatient Encounter Prescriptions as of 06/23/2016  Medication Sig Note  . aspirin 81 MG tablet Take 81 mg by mouth daily.     Marland Kitchen glipiZIDE (GLUCOTROL) 5 MG tablet TAKE 1 TABLET BY MOUTH DAILY BEFORE SUPPER   . hydrochlorothiazide (HYDRODIURIL) 25 MG tablet TAKE 1 TABLET(25 MG) BY MOUTH DAILY   . losartan (COZAAR) 50 MG tablet TAKE 1 TABLET(50 MG) BY MOUTH DAILY   . Omega-3 Fatty Acids (FISH OIL) 1000 MG CAPS Take 2 capsules by mouth daily.   . ONE TOUCH ULTRA TEST test strip USE AS DIRECTED   . ONETOUCH DELICA LANCETS FINE MISC U UTD 04/09/2015: Received from: External Pharmacy  . ONGLYZA 2.5 MG TABS tablet TAKE 1 TABLET(2.5 MG) BY MOUTH DAILY   . simvastatin (ZOCOR) 20 MG tablet TAKE 1 TABLET BY MOUTH AT BEDTIME   . valACYclovir (VALTREX) 1000 MG tablet Take 2,000 mg by mouth as needed. Reported on 04/09/2015 10/10/2014: From her dermatologist [facility]); prn fever blisters  . vardenafil (LEVITRA) 20 MG tablet Take 1 tablet (20 mg total) by mouth daily as needed for erectile dysfunction.   . pioglitazone (ACTOS) 45 MG tablet TAKE 1 TABLET(45 MG) BY MOUTH DAILY (Patient not taking: Reported on 06/23/2016)   . [DISCONTINUED] glipiZIDE (GLUCOTROL) 5 MG tablet TAKE 1 TABLET BY MOUTH DAILY BEFORE SUPPER    No facility-administered encounter medications on file as of 06/23/2016.     Allergies  Allergen Reactions  . Ace Inhibitors     cough    ROS: The patient denies anorexia, fever, headaches, vision loss, decreased hearing, ear pain,  hoarseness, chest pain, palpitations, dizziness, syncope, dyspnea on exertion, cough, swelling, nausea, vomiting, diarrhea, constipation, abdominal pain, melena, hematochezia, indigestion/heartburn, hematuria, incontinence, weakened urine stream, dysuria, genital lesions, joint pains, numbness, tingling, weakness, tremor, suspicious skin lesions, depression, anxiety, abnormal bleeding/bruising, or enlarged lymph nodes.  Up 2-3 times/night to void, unchanged. No urinary hesitancy. He occasionally gets jock itch, not currently, responds to OTC meds (lotrimin) +ED, as per HPI +snoring per wife.  Wakes up feeling refreshed, no daytime somnolence    PHYSICAL EXAM:  BP 120/60   Pulse (!) 58   Resp 16   Ht 6' 3.5" (1.918 m)   Wt 251 lb (113.9 kg)   SpO2 98%   BMI 30.96 kg/m   Wt Readings from Last 3 Encounters:  11/12/15 252 lb (114.3 kg)  04/09/15 247 lb 12.8 oz (112.4 kg)  02/06/15 257 lb (116.6 kg)   General Appearance:  Alert, cooperative, no distress, appears stated age   Head:  Normocephalic, without obvious abnormality, atraumatic   Eyes:  PERRL, conjunctiva/corneas clear, EOM's intact, fundi benign   Ears:  Normal TM's and external ear canals  Nose:  Nares normal, mucosa normal, no drainage or sinus tenderness   Throat:  Lips, mucosa, and tongue normal; teeth and gums normal   Neck:  Supple, no lymphadenopathy; thyroid: no enlargement/tenderness/nodules; no carotid bruit or JVD   Back:  Spine nontender, no curvature, ROM normal, no CVA tenderness   Lungs:  Clear to auscultation bilaterally without wheezes, rales or ronchi; respirations unlabored   Chest Wall:  No tenderness or deformity   Heart:  Regular rate and rhythm, S1 and S2 normal, no murmur, rub or gallop   Breast Exam:  No chest wall tenderness, masses or gynecomastia   Abdomen:  Soft, non-tender, nondistended, normoactive bowel sounds, no masses, no hepatosplenomegaly.  Mild ventral hernia, nontender.  Genitalia:  Normal male external genitalia without lesions. + penile piercing. Testicles without masses. No inguinal hernias.   Rectal:  Normal sphincter tone, no masses or tenderness; guaiac negative stool. Prostate smooth, no nodules, not enlarged.   Extremities:  No clubbing, cyanosis; no edema  Pulses:  2+ and symmetric all extremities   Skin:  Skin texture, turgor normal, no rashes or lesions. Normal diabetic foot exam. AK noted right lower leg, and a lot of actinic changes throughout.  Lymph nodes:  Cervical, supraclavicular, and axillary nodes normal   Neurologic:  CNII-XII intact, normal strength, sensation and gait; reflexes 2+ and symmetric throughout   Psych:   Normal mood, affect, hygiene and grooming       Diabetic foot exam--normal.  Feet are cold, but 2+ DP and PT pulses.  Some patchy rash left medial knee and right posterior thigh (small patchy rash, looks like a contact derm).  Lab Results  Component Value Date   HGBA1C 6.4 06/23/2016   Normal urine dip.   ASSESSMENT/PLAN:  Annual physical exam - Plan: Urinalysis Dipstick, Comprehensive metabolic panel, Lipid panel, TSH, CBC with Differential/Platelet  Medicare annual wellness visit, subsequent  Essential hypertension, benign - Plan: Comprehensive metabolic panel, hydrochlorothiazide (HYDRODIURIL) 25 MG tablet, losartan (COZAAR) 50 MG tablet  Type 2 diabetes with nephropathy ([place]) - well controlled; reviewed s/sx/glu of hypoglycemia and to stop glipizide if occurs. am sugars higher due to only taking glipizide qac dinner, but ok - Plan: HgB A1c, Comprehensive metabolic panel, TSH, Microalbumin / creatinine urine ratio, glipiZIDE (GLUCOTROL) 5 MG tablet, saxagliptin HCl (ONGLYZA) 2.5 MG TABS tablet, pioglitazone (ACTOS) 45 MG tablet  Dyslipidemia - Plan: Lipid panel, simvastatin (ZOCOR) 20 MG tablet  Medication monitoring encounter -  Plan: Comprehensive metabolic panel, Lipid panel, CBC with Differential/Platelet  Erectile dysfunction, unspecified erectile dysfunction type - levitra is effective. last testosterone level was normal; no change in energy or libido - Plan: vardenafil (LEVITRA) 20 MG tablet  Obesity (BMI 30.0-34.9) - encouraged weight loss, briefly discussed exercise (does lots), and diet/portions esp with travel  Chronic renal insufficiency, stage II (mild) - Plan: Comprehensive metabolic panel   Going [place] in June--counseled re: diet/portions/overeating  f/u regularly with derm(needs AK's treated)  c-met, lipids, TSH, microalb, CBC  MOST form updated and reviewed.  Full Code, Full care   Recommended at least 30 minutes of aerobic activity at least 5 days/week, weight-bearing exercise at least 2x/week; proper sunscreen use reviewed; healthy diet and alcohol  recommendations (less than or equal to 2 drinks/day) reviewed; regular seatbelt use; changing batteries in smoke detectors. Immunization recommendations discussed--Continue yearly flu shots. Shingrix recommended.risks/side effects reviewed. Colonoscopy recommendations reviewed, UTD  Reviewed living will and healthcare power of attorney info again--he was given copies of paperwork again. Encouraged to fill out; counseled extensively re: reasons to discuss with wife, and that she should also fill out. New forms given  Reviewed s/sx of hypoglycemia. Slightly unusual that he is just one once daily (prior to dinner) glipizide, not extended release. Previously considered doing 1/2 tablet twice daily, but since not seeing extreme fluctuations in blood sugars, and overall doing so well, left it the same. Explained to him though that this is why his bedtime sugars are better than the morning ones, when the med is wearing off.  If develops any hypoglycemia, advised to contact us and we would stop the glipizide.  Encouraged him to ask wife re:  apnea (he snores). Avoid sleeping on his back, and weight loss recommended.   Medicare Attestation I have personally reviewed: The patient's medical and social history Their use of alcohol, tobacco or illicit drugs Their current medications and supplements The patient's functional ability including ADLs,fall risks, home safety risks, cognitive, and hearing and visual impairment Diet and physical activities Evidence for depression or mood disorders  The patient's weight, height, and BMI have been recorded in the chart.  I have made referrals, counseling, and provided education to the patient based on review of the above and I have provided the patient with a written personalized care plan for preventive services.

## 2016-06-23 ENCOUNTER — Ambulatory Visit (INDEPENDENT_AMBULATORY_CARE_PROVIDER_SITE_OTHER): Payer: Medicare Other | Admitting: Family Medicine

## 2016-06-23 ENCOUNTER — Encounter: Payer: Self-pay | Admitting: Family Medicine

## 2016-06-23 VITALS — BP 120/60 | HR 58 | Resp 16 | Ht 75.5 in | Wt 251.0 lb

## 2016-06-23 DIAGNOSIS — E669 Obesity, unspecified: Secondary | ICD-10-CM

## 2016-06-23 DIAGNOSIS — E1121 Type 2 diabetes mellitus with diabetic nephropathy: Secondary | ICD-10-CM

## 2016-06-23 DIAGNOSIS — Z0000 Encounter for general adult medical examination without abnormal findings: Secondary | ICD-10-CM

## 2016-06-23 DIAGNOSIS — I10 Essential (primary) hypertension: Secondary | ICD-10-CM | POA: Diagnosis not present

## 2016-06-23 DIAGNOSIS — E66811 Obesity, class 1: Secondary | ICD-10-CM

## 2016-06-23 DIAGNOSIS — E785 Hyperlipidemia, unspecified: Secondary | ICD-10-CM

## 2016-06-23 DIAGNOSIS — N182 Chronic kidney disease, stage 2 (mild): Secondary | ICD-10-CM

## 2016-06-23 DIAGNOSIS — N529 Male erectile dysfunction, unspecified: Secondary | ICD-10-CM

## 2016-06-23 DIAGNOSIS — Z5181 Encounter for therapeutic drug level monitoring: Secondary | ICD-10-CM | POA: Diagnosis not present

## 2016-06-23 DIAGNOSIS — N2889 Other specified disorders of kidney and ureter: Secondary | ICD-10-CM

## 2016-06-23 LAB — COMPREHENSIVE METABOLIC PANEL
ALT: 16 U/L (ref 9–46)
AST: 19 U/L (ref 10–35)
Albumin: 4.2 g/dL (ref 3.6–5.1)
Alkaline Phosphatase: 41 U/L (ref 40–115)
BUN: 25 mg/dL (ref 7–25)
CO2: 23 mmol/L (ref 20–31)
Calcium: 9.2 mg/dL (ref 8.6–10.3)
Chloride: 99 mmol/L (ref 98–110)
Creat: 2 mg/dL — ABNORMAL HIGH (ref 0.70–1.18)
Glucose, Bld: 152 mg/dL — ABNORMAL HIGH (ref 65–99)
Potassium: 4.2 mmol/L (ref 3.5–5.3)
Sodium: 137 mmol/L (ref 135–146)
Total Bilirubin: 0.8 mg/dL (ref 0.2–1.2)
Total Protein: 7 g/dL (ref 6.1–8.1)

## 2016-06-23 LAB — CBC WITH DIFFERENTIAL/PLATELET
Basophils Absolute: 60 cells/uL (ref 0–200)
Basophils Relative: 1 %
Eosinophils Absolute: 120 cells/uL (ref 15–500)
Eosinophils Relative: 2 %
HCT: 42.7 % (ref 38.5–50.0)
Hemoglobin: 13.8 g/dL (ref 13.2–17.1)
Lymphocytes Relative: 15 %
Lymphs Abs: 900 cells/uL (ref 850–3900)
MCH: 30.7 pg (ref 27.0–33.0)
MCHC: 32.3 g/dL (ref 32.0–36.0)
MCV: 94.9 fL (ref 80.0–100.0)
MPV: 11.7 fL (ref 7.5–12.5)
Monocytes Absolute: 540 cells/uL (ref 200–950)
Monocytes Relative: 9 %
Neutro Abs: 4380 cells/uL (ref 1500–7800)
Neutrophils Relative %: 73 %
Platelets: 136 10*3/uL — ABNORMAL LOW (ref 140–400)
RBC: 4.5 MIL/uL (ref 4.20–5.80)
RDW: 14.1 % (ref 11.0–15.0)
WBC: 6 10*3/uL (ref 4.0–10.5)

## 2016-06-23 LAB — POCT URINALYSIS DIPSTICK
Bilirubin, UA: NEGATIVE
Blood, UA: NEGATIVE
Glucose, UA: NEGATIVE
Ketones, UA: NEGATIVE
Leukocytes, UA: NEGATIVE
Nitrite, UA: NEGATIVE
Protein, UA: NEGATIVE
Spec Grav, UA: 1.02 (ref 1.010–1.025)
Urobilinogen, UA: NEGATIVE E.U./dL — AB
pH, UA: 6 (ref 5.0–8.0)

## 2016-06-23 LAB — TSH: TSH: 2.21 mIU/L (ref 0.40–4.50)

## 2016-06-23 LAB — LIPID PANEL
Cholesterol: 147 mg/dL (ref <200)
HDL: 66 mg/dL (ref >40)
LDL Cholesterol: 69 mg/dL (ref <100)
Total CHOL/HDL Ratio: 2.2 Ratio (ref <5.0)
Triglycerides: 58 mg/dL (ref <150)
VLDL: 12 mg/dL (ref <30)

## 2016-06-23 LAB — POCT GLYCOSYLATED HEMOGLOBIN (HGB A1C): Hemoglobin A1C: 6.4

## 2016-06-23 MED ORDER — GLIPIZIDE 5 MG PO TABS
ORAL_TABLET | ORAL | 5 refills | Status: DC
Start: 2016-06-23 — End: 2017-02-02

## 2016-06-23 MED ORDER — LOSARTAN POTASSIUM 50 MG PO TABS: ORAL_TABLET | ORAL | 5 refills | Status: DC

## 2016-06-23 MED ORDER — HYDROCHLOROTHIAZIDE 25 MG PO TABS: ORAL_TABLET | ORAL | 5 refills | Status: DC

## 2016-06-23 MED ORDER — SAXAGLIPTIN HCL 2.5 MG PO TABS: ORAL_TABLET | ORAL | 5 refills | Status: DC

## 2016-06-23 MED ORDER — VARDENAFIL HCL 20 MG PO TABS: 20.0000 mg | ORAL_TABLET | Freq: Every day | ORAL | 10 refills | Status: DC | PRN

## 2016-06-23 MED ORDER — SIMVASTATIN 20 MG PO TABS: ORAL_TABLET | ORAL | 5 refills | Status: DC

## 2016-06-23 MED ORDER — PIOGLITAZONE HCL 45 MG PO TABS: ORAL_TABLET | ORAL | 5 refills | Status: DC

## 2016-06-23 NOTE — Patient Instructions (Addendum)
  HEALTH MAINTENANCE RECOMMENDATIONS:  It is recommended that you get at least 30 minutes of aerobic exercise at least 5 days/week (for weight loss, you may need as much as 60-90 minutes). This can be any activity that gets your heart rate up. This can be divided in 10-15 minute intervals if needed, but try and build up your endurance at least once a week.  Weight bearing exercise is also recommended twice weekly.  Eat a healthy diet with lots of vegetables, fruits and fiber.  "Colorful" foods have a lot of vitamins (ie green vegetables, tomatoes, red peppers, etc).  Limit sweet tea, regular sodas and alcoholic beverages, all of which has a lot of calories and sugar.  Up to 2 alcoholic drinks daily may be beneficial for men (unless trying to lose weight, watch sugars).  Drink a lot of water.  Sunscreen of at least SPF 30 should be used on all sun-exposed parts of the skin when outside between the hours of 10 am and 4 pm (not just when at beach or pool, but even with exercise, golf, tennis, and yard work!)  Use a sunscreen that says "broad spectrum" so it covers both UVA and UVB rays, and make sure to reapply every 1-2 hours.  Remember to change the batteries in your smoke detectors when changing your clock times in the spring and fall.  Use your seat belt every time you are in a car, and please drive safely and not be distracted with cell phones and texting while driving.   Mr. Lortie , Thank you for taking time to come for your Medicare Wellness Visit. I appreciate your ongoing commitment to your health goals. Please review the following plan we discussed and let me know if I can assist you in the future.   These are the goals we discussed: Goals    None      This is a list of the screening recommended for you and due dates:  Health Maintenance  Topic Date Due  . Eye exam for diabetics  05/22/1955  . Complete foot exam   04/08/2016  . Hemoglobin A1C  05/11/2016  . Flu Shot  09/29/2016  .  Tetanus Vaccine  07/26/2018  . Colon Cancer Screening  05/14/2020  .  Hepatitis C: One time screening is recommended by Center for Disease Control  (CDC) for  adults born from [birth year] through 1964.   Completed  . Pneumonia vaccines  Completed   Please schedule your diabetic eye exam and make sure they send [place] a report. Foot exam done today, A1c was done today.  I recommend getting the new shingles vaccine (Shingrix). You will need to check with your insurance to see if it is covered, and if covered by Medicare Part D, you need to get from the pharmacy rather than our office.  It is a series of 2 injections, spaced 2 months apart.  Check with your wife to ensure that you don't take long, frequent pauses in your breathing while sleeping and snoring. Continued weight loss is recommended.    Please discuss the Living Will and Healthcare power of attorney with your wife.  Get [place] copies at your convenience once filled out.   If you find that your sugars are running under 80 frequently, or if they ever drop into the 60's, please contact us.  We will want to cut back on your glipizide.

## 2016-06-24 LAB — MICROALBUMIN / CREATININE URINE RATIO
Creatinine, Urine: 98 mg/dL (ref 20–370)
Microalb Creat Ratio: 3 mcg/mg creat (ref <30)
Microalb, Ur: 0.3 mg/dL

## 2016-12-30 ENCOUNTER — Encounter: Payer: Self-pay | Admitting: *Deleted

## 2017-01-08 ENCOUNTER — Other Ambulatory Visit: Payer: Self-pay | Admitting: Family Medicine

## 2017-01-08 DIAGNOSIS — E1121 Type 2 diabetes mellitus with diabetic nephropathy: Secondary | ICD-10-CM

## 2017-01-08 DIAGNOSIS — I10 Essential (primary) hypertension: Secondary | ICD-10-CM

## 2017-01-24 ENCOUNTER — Other Ambulatory Visit: Payer: Self-pay | Admitting: Family Medicine

## 2017-01-24 DIAGNOSIS — E785 Hyperlipidemia, unspecified: Secondary | ICD-10-CM

## 2017-02-01 NOTE — Progress Notes (Signed)
Chief Complaint  Patient presents with  . Diabetes    fasting med check. Next Thurs he is scheduled for diabetic eye exam with new doctor.  And sees Dr. Marval Regal next week as well.     Diabetes. Morning sugars are running 140-150 since he hasn't been able to exercise.  He tore the cartilage in his right knee 11/26/16 (injured at the gym), hasn't been able to exercise in 2 months. He is seeing Dr. Delilah Shan at [facility].  He is still doing some weights at the gym. Denies hypoglycemia, polydipsia, polyuria.Denies any numbness, tingling, lesions/sores. Diabetic eye exam was 03/2015, scheduled for next Thursday. He still avoids carbs, no longer eats much potatoes (1/week), continues to avoid sweets, sugar and limit portions.   Hypertension follow-up: Blood pressures are running 115-120/58-68.Denies dizziness,headaches, chest pain. Denies side effects of medications.  He saw Dr. Renda Rolls in May, froze some areas on head and face.  Sees Dr. Arty Baumgartner regularly, and has an appointment next week.   PMH, PSH, SH reviewed  Outpatient Encounter Medications as of 02/02/2017  Medication Sig Note  . aspirin 81 MG tablet Take 81 mg by mouth daily.     Marland Kitchen glipiZIDE (GLUCOTROL) 5 MG tablet TAKE 1 TABLET BY MOUTH DAILY BEFORE SUPPER   . hydrochlorothiazide (HYDRODIURIL) 25 MG tablet Take 1 tablet (25 mg total) by mouth daily.   Marland Kitchen losartan (COZAAR) 50 MG tablet Take 1 tablet (50 mg total) by mouth daily.   . Omega-3 Fatty Acids (FISH OIL) 1000 MG CAPS Take 2 capsules by mouth daily.   . ONE TOUCH ULTRA TEST test strip USE AS DIRECTED   . ONETOUCH DELICA LANCETS FINE MISC U UTD 04/09/2015: Received from: External Pharmacy  . pioglitazone (ACTOS) 45 MG tablet Take 1 tablet (45 mg total) by mouth daily.   . saxagliptin HCl (ONGLYZA) 2.5 MG TABS tablet TAKE 1 TABLET(2.5 MG) BY MOUTH DAILY   . simvastatin (ZOCOR) 20 MG tablet TAKE 1 TABLET BY MOUTH AT BEDTIME   . vardenafil (LEVITRA) 20 MG tablet  Take 1 tablet (20 mg total) by mouth daily as needed for erectile dysfunction.   . [DISCONTINUED] glipiZIDE (GLUCOTROL) 5 MG tablet TAKE 1 TABLET BY MOUTH DAILY BEFORE SUPPER   . [DISCONTINUED] hydrochlorothiazide (HYDRODIURIL) 25 MG tablet TAKE 1 TABLET(25 MG) BY MOUTH DAILY   . [DISCONTINUED] losartan (COZAAR) 50 MG tablet TAKE 1 TABLET(50 MG) BY MOUTH DAILY   . [DISCONTINUED] pioglitazone (ACTOS) 45 MG tablet TAKE 1 TABLET(45 MG) BY MOUTH DAILY   . [DISCONTINUED] saxagliptin HCl (ONGLYZA) 2.5 MG TABS tablet TAKE 1 TABLET(2.5 MG) BY MOUTH DAILY   . valACYclovir (VALTREX) 1000 MG tablet Take 2,000 mg by mouth as needed. Reported on 04/09/2015 10/10/2014: From her dermatologist [facility]); prn fever blisters   No facility-administered encounter medications on file as of 02/02/2017.      ROS: no fever, chills, headaches, dizziness, chest pain, palpitations, nausea, vomiting, bowel changes, skin concerns, bleeding, bruising, rash.  Slight sneezing from allergies, with some hoarseness currently. No cough, shortness of breath. +weight gain since not exercising Up 2-3x/night to void, falls right back to sleep, unchanged. Some bruising on his hands (due to aspirin) when he bangs it at the gym. +right knee pain, no other joint pains.   PHYSICAL EXAM:  BP 120/70   Pulse 60   Ht 6' 3.5" (1.918 m)   Wt 259 lb (117.5 kg)   BMI 31.95 kg/m   Wt Readings from Last 3 Encounters:  02/02/17  259 lb (117.5 kg)  06/23/16 251 lb (113.9 kg)  11/12/15 252 lb (114.3 kg)    Well developed, pleasant male in no distress HEENT: PERRL, conjunctiva and sclera are clear. OP  no lesions Neck: no lymphadenopathy, thyromegaly or mass, no bruit. Heart: regular rate and rhythm Lungs: clear bilaterally Extremities: no edema, 2+ pulses. Knee not examined  Skin: no rash. AKs on LE's bilaterally.   Neuro: alert and oriented. Cranial nerves intact. Normal strength, gait Psych: normal mood, affect, hygiene and  grooming   Lab Results  Component Value Date   HGBA1C 7.0 02/02/2017     ASSESSMENT/PLAN:  Essential hypertension, benign - well controlled - Plan: Comprehensive metabolic panel, hydrochlorothiazide (HYDRODIURIL) 25 MG tablet, losartan (COZAAR) 50 MG tablet  Type 2 diabetes with nephropathy ([place]) - significantly worse, since knee injury and limited exercise; now borderline but expect A1c to continue to rise. Discussed diet, exercise, f/u w/ortho. No change - Plan: HgB A1c, Comprehensive metabolic panel, glipiZIDE (GLUCOTROL) 5 MG tablet, pioglitazone (ACTOS) 45 MG tablet, saxagliptin HCl (ONGLYZA) 2.5 MG TABS tablet  Dyslipidemia - Plan: Lipid panel, simvastatin (ZOCOR) 20 MG tablet  Chronic renal insufficiency, stage II (mild) - has f/u scheduled with nephro; will forward labs  Medication monitoring encounter - Plan: Comprehensive metabolic panel, Lipid panel  Screening for prostate cancer - Plan: PSA  Type 2 diabetes with nephropathy ([facility]) - Plan: HgB A1c, Comprehensive metabolic panel, glipiZIDE (GLUCOTROL) 5 MG tablet, pioglitazone (ACTOS) 45 MG tablet, saxagliptin HCl (ONGLYZA) 2.5 MG TABS tablet  Allergic rhinitis, unspecified seasonality, unspecified trigger - discussed OTC antihistamines he can use prn   Remind to get shingrix at pharmacy   Follow up with your orthopedist, since it has been over 2 months and you still have significant pain and limitations with your right knee.  It is important to cut back on your food intake since you aren't burning as many calories since your exercise changed, and you are gaining weight.  You can use claritin, or zyrtec (plain) if needed for allergies.    Try and do less weight-bearing exercise, might not hurt your knee as much.  Try seated exercises (ie bike) or swimming.  As long as you are able to gradually increase your exercise and prevent further weight gain, we may be able to keep your medications the same.  If your sugars are  consistently >140-150 over the next few weeks, we may need to bump up the glipizide to twice daily.  Please don't do this on your own, but let me know an update with regard to your sugars and plans for your knee.

## 2017-02-02 ENCOUNTER — Telehealth: Payer: Self-pay | Admitting: Family Medicine

## 2017-02-02 ENCOUNTER — Ambulatory Visit: Payer: Medicare Other | Admitting: Family Medicine

## 2017-02-02 ENCOUNTER — Encounter: Payer: Self-pay | Admitting: Family Medicine

## 2017-02-02 VITALS — BP 120/70 | HR 60 | Ht 75.5 in | Wt 259.0 lb

## 2017-02-02 DIAGNOSIS — E1121 Type 2 diabetes mellitus with diabetic nephropathy: Secondary | ICD-10-CM | POA: Diagnosis not present

## 2017-02-02 DIAGNOSIS — E785 Hyperlipidemia, unspecified: Secondary | ICD-10-CM

## 2017-02-02 DIAGNOSIS — Z5181 Encounter for therapeutic drug level monitoring: Secondary | ICD-10-CM

## 2017-02-02 DIAGNOSIS — N182 Chronic kidney disease, stage 2 (mild): Secondary | ICD-10-CM | POA: Diagnosis not present

## 2017-02-02 DIAGNOSIS — J309 Allergic rhinitis, unspecified: Secondary | ICD-10-CM

## 2017-02-02 DIAGNOSIS — Z125 Encounter for screening for malignant neoplasm of prostate: Secondary | ICD-10-CM | POA: Diagnosis not present

## 2017-02-02 DIAGNOSIS — I10 Essential (primary) hypertension: Secondary | ICD-10-CM | POA: Diagnosis not present

## 2017-02-02 LAB — POCT GLYCOSYLATED HEMOGLOBIN (HGB A1C): Hemoglobin A1C: 7

## 2017-02-02 MED ORDER — HYDROCHLOROTHIAZIDE 25 MG PO TABS: 25.0000 mg | ORAL_TABLET | Freq: Every day | ORAL | 5 refills | Status: DC

## 2017-02-02 MED ORDER — PIOGLITAZONE HCL 45 MG PO TABS: 45.0000 mg | ORAL_TABLET | Freq: Every day | ORAL | 5 refills | Status: DC

## 2017-02-02 MED ORDER — GLIPIZIDE 5 MG PO TABS: ORAL_TABLET | ORAL | 5 refills | Status: DC

## 2017-02-02 MED ORDER — LOSARTAN POTASSIUM 50 MG PO TABS: 50.0000 mg | ORAL_TABLET | Freq: Every day | ORAL | 5 refills | Status: DC

## 2017-02-02 MED ORDER — SAXAGLIPTIN HCL 2.5 MG PO TABS: ORAL_TABLET | ORAL | 5 refills | Status: DC

## 2017-02-02 NOTE — Telephone Encounter (Signed)
FYI  Per Dr Johnsie Kindred instructions James Glover need to return for AWV/CPE in April & no later than June. James Glover prefers a morning appointment and a morning appointment is not open until August. Offered James Glover afternoon with options to come in either early for labs or earlier that same morning for labs. He did not want that. Also offered the cancellation list. Also did not want that. Is it ok for James Glover to wait for 10/17/17 appointment?

## 2017-02-02 NOTE — Telephone Encounter (Signed)
It is NOT okay to wait until August.  He is a diabetic and needs to be seen every 6 months.  I can put in lab orders to be done prior, so he can come up to a week earlier, if he prefers. He does not need to fast for an afternoon appointment if labs are done in advance. Send back to me for orders if agreeable.

## 2017-02-02 NOTE — Patient Instructions (Addendum)
  Follow up with your orthopedist, since it has been over 2 months and you still have significant pain and limitations with your right knee.  It is important to cut back on your food intake since you aren't burning as many calories since your exercise changed, and you are gaining weight.  Try and do less weight-bearing exercise, might not hurt your knee as much.  Try seated exercises (ie bike) or swimming.  As long as you are able to gradually increase your exercise and prevent further weight gain, we may be able to keep your medications the same.  If your sugars are consistently >140-150 over the next few weeks, we may need to bump up the glipizide to twice daily.  Please don't do this on your own, but let me know an update with regard to your sugars and plans for your knee.   You can use claritin, or zyrtec (plain) if needed for allergies.    I recommend getting the new shingles vaccine (Shingrix). You will need to check with your insurance to see if it is covered, and if covered by Medicare Part D, you need to get from the pharmacy rather than our office.  It is a series of 2 injections, spaced 2 months apart.

## 2017-02-03 LAB — COMPREHENSIVE METABOLIC PANEL
AG Ratio: 1.5 (calc) (ref 1.0–2.5)
ALT: 14 U/L (ref 9–46)
AST: 17 U/L (ref 10–35)
Albumin: 3.9 g/dL (ref 3.6–5.1)
Alkaline phosphatase (APISO): 41 U/L (ref 40–115)
BUN/Creatinine Ratio: 17 (calc) (ref 6–22)
BUN: 30 mg/dL — ABNORMAL HIGH (ref 7–25)
CO2: 27 mmol/L (ref 20–32)
Calcium: 9.3 mg/dL (ref 8.6–10.3)
Chloride: 101 mmol/L (ref 98–110)
Creat: 1.73 mg/dL — ABNORMAL HIGH (ref 0.70–1.18)
Globulin: 2.6 g/dL (calc) (ref 1.9–3.7)
Glucose, Bld: 165 mg/dL — ABNORMAL HIGH (ref 65–99)
Potassium: 4.5 mmol/L (ref 3.5–5.3)
Sodium: 136 mmol/L (ref 135–146)
Total Bilirubin: 0.7 mg/dL (ref 0.2–1.2)
Total Protein: 6.5 g/dL (ref 6.1–8.1)

## 2017-02-03 LAB — LIPID PANEL
Cholesterol: 145 mg/dL (ref <200)
HDL: 61 mg/dL (ref >40)
LDL Cholesterol (Calc): 71 mg/dL (calc)
Non-HDL Cholesterol (Calc): 84 mg/dL (calc) (ref <130)
Total CHOL/HDL Ratio: 2.4 (calc) (ref <5.0)
Triglycerides: 52 mg/dL (ref <150)

## 2017-02-03 LAB — PSA: PSA: 3.6 ng/mL (ref <=4.0)

## 2017-02-04 MED ORDER — SIMVASTATIN 20 MG PO TABS: 20.0000 mg | ORAL_TABLET | Freq: Every day | ORAL | 5 refills | Status: DC

## 2017-02-09 LAB — HM DIABETES EYE EXAM

## 2017-03-29 NOTE — Telephone Encounter (Signed)
Orders entered

## 2017-03-29 NOTE — Telephone Encounter (Signed)
Pt has rescheduled awv/cpe to 07/07/17 and scheduled prior fasting labs on 07/04/17. Will need lab orders entered.

## 2017-05-16 ENCOUNTER — Telehealth: Payer: Self-pay | Admitting: Family Medicine

## 2017-05-16 NOTE — Telephone Encounter (Signed)
He should have all his labs done prior to his July appointment (has orders in system). Change his physical to December please--no point in having the two visits so close together.  If he needs labs done prior to the December visit, they will be ordered at his July visit. thanks

## 2017-05-16 NOTE — Telephone Encounter (Signed)
Mr. Richison is going to be out of the country and had to reschedule his May CPE and it is now going to be in October with fasting labs 2 days prior  We scheduled a medcheck for July since he will be coming in later. Do you want him to have fasting labs in July?

## 2017-05-19 NOTE — Telephone Encounter (Signed)
Call pt and made changes requested.

## 2017-05-26 ENCOUNTER — Other Ambulatory Visit: Payer: Self-pay | Admitting: Family Medicine

## 2017-05-26 DIAGNOSIS — E1121 Type 2 diabetes mellitus with diabetic nephropathy: Secondary | ICD-10-CM

## 2017-06-13 ENCOUNTER — Telehealth: Payer: Self-pay | Admitting: Family Medicine

## 2017-06-13 NOTE — Telephone Encounter (Signed)
Make sure he knows that the sildenafil 20mg  is generic for medication similar to Viagra, but a much lower dose.  Viagra usually comes as 50 or 100mg  tablets.  So, he may need to take between 2-5 tablets of the generic sildenafil to be effective.  To start with 2 tablets (40mg ) and increase to larger amount if not effective. He may want a larger quantity, in the future if this is effective (might want to try and this quantity first to see how it works?).  Okay for rx if he wants

## 2017-06-13 NOTE — Telephone Encounter (Signed)
Pt called and states Vardenafil not covered and cost $586, would like to switch to Sildenafil said pharmacy has discount card and will be cheap.  Would like to switch to Sildenafil 20mg  #15.

## 2017-06-13 NOTE — Telephone Encounter (Signed)
Left message for patient to call back to discuss Dr.Knapp's response.

## 2017-06-14 NOTE — Telephone Encounter (Signed)
Informed patient of providers note. Patient stated he will call back if he needs a higher quantity dispensed.

## 2017-06-15 ENCOUNTER — Other Ambulatory Visit: Payer: Self-pay | Admitting: *Deleted

## 2017-06-15 MED ORDER — SILDENAFIL CITRATE 20 MG PO TABS: 20.0000 mg | ORAL_TABLET | Freq: Three times a day (TID) | ORAL | 0 refills | Status: DC

## 2017-07-04 ENCOUNTER — Other Ambulatory Visit: Payer: Medicare Other

## 2017-07-07 ENCOUNTER — Ambulatory Visit: Payer: Medicare Other | Admitting: Family Medicine

## 2017-08-09 ENCOUNTER — Other Ambulatory Visit: Payer: Self-pay | Admitting: Family Medicine

## 2017-08-09 DIAGNOSIS — I10 Essential (primary) hypertension: Secondary | ICD-10-CM

## 2017-08-09 DIAGNOSIS — E1121 Type 2 diabetes mellitus with diabetic nephropathy: Secondary | ICD-10-CM

## 2017-08-09 NOTE — Telephone Encounter (Signed)
Pt has an appt coming up in july

## 2017-08-15 ENCOUNTER — Other Ambulatory Visit: Payer: Self-pay | Admitting: Family Medicine

## 2017-08-15 DIAGNOSIS — E1121 Type 2 diabetes mellitus with diabetic nephropathy: Secondary | ICD-10-CM

## 2017-08-29 ENCOUNTER — Other Ambulatory Visit: Payer: Self-pay | Admitting: Family Medicine

## 2017-08-29 DIAGNOSIS — E785 Hyperlipidemia, unspecified: Secondary | ICD-10-CM

## 2017-08-31 ENCOUNTER — Other Ambulatory Visit: Payer: Medicare Other

## 2017-08-31 DIAGNOSIS — E785 Hyperlipidemia, unspecified: Secondary | ICD-10-CM

## 2017-08-31 DIAGNOSIS — E1121 Type 2 diabetes mellitus with diabetic nephropathy: Secondary | ICD-10-CM

## 2017-08-31 DIAGNOSIS — Z5181 Encounter for therapeutic drug level monitoring: Secondary | ICD-10-CM

## 2017-08-31 DIAGNOSIS — I10 Essential (primary) hypertension: Secondary | ICD-10-CM

## 2017-09-01 LAB — CBC WITH DIFFERENTIAL/PLATELET
Basophils Absolute: 0 10*3/uL (ref 0.0–0.2)
Basos: 1 %
EOS (ABSOLUTE): 0.2 10*3/uL (ref 0.0–0.4)
Eos: 3 %
Hematocrit: 40.4 % (ref 37.5–51.0)
Hemoglobin: 13 g/dL (ref 13.0–17.7)
Immature Grans (Abs): 0.1 10*3/uL (ref 0.0–0.1)
Immature Granulocytes: 1 %
Lymphocytes Absolute: 0.8 10*3/uL (ref 0.7–3.1)
Lymphs: 13 %
MCH: 30.8 pg (ref 26.6–33.0)
MCHC: 32.2 g/dL (ref 31.5–35.7)
MCV: 96 fL (ref 79–97)
Monocytes Absolute: 0.6 10*3/uL (ref 0.1–0.9)
Monocytes: 10 %
Neutrophils Absolute: 4.8 10*3/uL (ref 1.4–7.0)
Neutrophils: 72 %
Platelets: 155 10*3/uL (ref 150–450)
RBC: 4.22 x10E6/uL (ref 4.14–5.80)
RDW: 13.4 % (ref 12.3–15.4)
WBC: 6.6 10*3/uL (ref 3.4–10.8)

## 2017-09-01 LAB — COMPREHENSIVE METABOLIC PANEL
ALT: 10 IU/L (ref 0–44)
AST: 12 IU/L (ref 0–40)
Albumin/Globulin Ratio: 1.3 (ref 1.2–2.2)
Albumin: 3.6 g/dL (ref 3.5–4.8)
Alkaline Phosphatase: 48 IU/L (ref 39–117)
BUN/Creatinine Ratio: 15 (ref 10–24)
BUN: 31 mg/dL — ABNORMAL HIGH (ref 8–27)
Bilirubin Total: 0.4 mg/dL (ref 0.0–1.2)
CO2: 24 mmol/L (ref 20–29)
Calcium: 9.1 mg/dL (ref 8.6–10.2)
Chloride: 99 mmol/L (ref 96–106)
Creatinine, Ser: 2.11 mg/dL — ABNORMAL HIGH (ref 0.76–1.27)
GFR calc Af Amer: 35 mL/min/{1.73_m2} — ABNORMAL LOW (ref >59)
GFR calc non Af Amer: 31 mL/min/{1.73_m2} — ABNORMAL LOW (ref >59)
Globulin, Total: 2.8 g/dL (ref 1.5–4.5)
Glucose: 157 mg/dL — ABNORMAL HIGH (ref 65–99)
Potassium: 4.8 mmol/L (ref 3.5–5.2)
Sodium: 137 mmol/L (ref 134–144)
Total Protein: 6.4 g/dL (ref 6.0–8.5)

## 2017-09-01 LAB — MICROALBUMIN / CREATININE URINE RATIO
Creatinine, Urine: 131.1 mg/dL
Microalb/Creat Ratio: <2.3 mg/g creat (ref 0.0–30.0)
Microalbumin, Urine: <3 ug/mL

## 2017-09-01 LAB — HEMOGLOBIN A1C
Est. average glucose Bld gHb Est-mCnc: 146 mg/dL
Hgb A1c MFr Bld: 6.7 % — ABNORMAL HIGH (ref 4.8–5.6)

## 2017-09-01 LAB — TSH: TSH: 2.58 u[IU]/mL (ref 0.450–4.500)

## 2017-09-01 LAB — LIPID PANEL
Chol/HDL Ratio: 3.3 ratio (ref 0.0–5.0)
Cholesterol, Total: 138 mg/dL (ref 100–199)
HDL: 42 mg/dL (ref >39)
LDL Calculated: 84 mg/dL (ref 0–99)
Triglycerides: 58 mg/dL (ref 0–149)
VLDL Cholesterol Cal: 12 mg/dL (ref 5–40)

## 2017-09-04 NOTE — Progress Notes (Signed)
Chief Complaint  Patient presents with  . Diabetes    nonfasting med check.    [facility] with cough, congestion.  Told it was allergies, postnasal drainage. Treated with flonase, mucinex, cetirizine, given x 14 days. He is much better.  Diabetes. Morning sugars are running 100-150's. He didn't bring a list.  Recalls they tend to mostly run 140 range, that they are highest in the morning.  Admits to not checking them if he thinks it might be high (after eating sweets). A nurse came to the house last month (through insurance), and A1c was 7.2.    Denies hypoglycemia, polydipsia, polyuria (up 3x/night, unchanged).Denies any numbness, tingling, lesions/sores. Diabetic eye exam was 01/2017 (no records received), told all was good. He still avoids carbs, no longer eats muchpotatoes (1/week),continues to avoid sweets (just once a week), sugar and limit portions.   He tore the cartilage in his right knee 11/26/16 (injured at the gym), seeing Dr. Delilah Shan at [facility] orthopedics. He had cortisone injection in late May, which helped, but is now starting to wear off. Goes to the gym daily; limits walking to 1 mile/day.  He does 4.5 mile son the seated elliptical, 30 mins on seated bike, arm and ab machines and weights.  Hypertension follow-up: Blood pressures are running 118-136 (mostly 120)/60's-70's.Denies dizziness,headaches, chest pain. Denies side effects of medications.  Sees Dr. Napoleon Form, although last check (April) was with a different doctor at that practice, everything stable, per pt.   PMH, PSH, SH reviewed  Outpatient Encounter Medications as of 09/05/2017  Medication Sig Note  . aspirin 81 MG tablet Take 81 mg by mouth daily.     . cetirizine HCl (ZYRTEC) 1 MG/ML solution Take 10 mg by mouth daily. 09/05/2017: TABLETS--not liquid  . fluticasone (FLONASE) 50 MCG/ACT nasal spray Place 1 spray into both nostrils 2 (two) times daily.   Marland Kitchen  glipiZIDE (GLUCOTROL) 5 MG tablet TAKE 1 TABLET BY MOUTH DAILY BEFORE SUPPER   . guaiFENesin (MUCINEX PO) Take 1 tablet by mouth as needed.   . hydrochlorothiazide (HYDRODIURIL) 25 MG tablet TAKE 1 TABLET(25 MG) BY MOUTH DAILY   . losartan (COZAAR) 50 MG tablet TAKE 1 TABLET(50 MG) BY MOUTH DAILY   . Omega-3 Fatty Acids (FISH OIL) 1000 MG CAPS Take 2 capsules by mouth daily.   . ONE TOUCH ULTRA TEST test strip USE AS DIRECTED   . ONETOUCH DELICA LANCETS FINE MISC U UTD 04/09/2015: Received from: External Pharmacy  . pioglitazone (ACTOS) 45 MG tablet TAKE 1 TABLET(45 MG) BY MOUTH DAILY   . saxagliptin HCl (ONGLYZA) 2.5 MG TABS tablet TAKE 1 TABLET(2.5 MG) BY MOUTH DAILY   . sildenafil (REVATIO) 20 MG tablet Take 1 tablet (20 mg total) by mouth 3 (three) times daily.   . simvastatin (ZOCOR) 20 MG tablet TAKE 1 TABLET(20 MG) BY MOUTH AT BEDTIME   . valACYclovir (VALTREX) 1000 MG tablet Take 2,000 mg by mouth as needed. Reported on 04/09/2015 10/10/2014: From her dermatologist [facility]); prn fever blisters  . [DISCONTINUED] glipiZIDE (GLUCOTROL) 5 MG tablet TAKE 1 TABLET BY MOUTH DAILY BEFORE SUPPER   . [DISCONTINUED] vardenafil (LEVITRA) 20 MG tablet Take 1 tablet (20 mg total) by mouth daily as needed for erectile dysfunction. (Patient not taking: Reported on 09/05/2017) 09/05/2017: Not using, too expensive   No facility-administered encounter medications on file as of 09/05/2017.     ROS: no fever, chills, headaches, dizziness, chest pain, palpitations, nausea, vomiting, bowel  changes, skin concerns, bleeding, bruising, rash. Allergies and congestion improved with meds from [facility].No cough, shortness of breath. Up 2-3x/night to void, falls right back to sleep, unchanged. Some bruising on his hands (due to aspirin) when he bangs it at the gym. +right knee pain per HPI (just starting to return after injection), no other joint pains.   PHYSICAL EXAM:  BP 140/70   Pulse 60   Ht 6' 3.5"  (1.918 m)   Wt 256 lb (116.1 kg)   BMI 31.58 kg/m   Wt Readings from Last 3 Encounters:  09/05/17 256 lb (116.1 kg)  02/02/17 259 lb (117.5 kg)  06/23/16 251 lb (113.9 kg)   Repeat BP 132/70  Well developed, pleasant male in no distress HEENT: PERRL, conjunctiva and sclera are clear.  Nasal mucosa is mildly edematous, no drainage. Sinuses are nontender. OP is normal. Neck: no lymphadenopathy, thyromegaly or mass, no bruit. Heart: regular rate and rhythm Lungs: clear bilaterally Extremities: no edema, 2+ pulses. Minimal swelling at right knee, no erythema or warmth. Skin: no rash, dry on lower legs  Neuro: alert and oriented. Cranial nerves intact. Normal strength, gait Psych: normal mood, affect, hygiene and grooming   Lab Results  Component Value Date   HGBA1C 6.7 (H) 08/31/2017   Lab Results  Component Value Date   CHOL 138 08/31/2017   HDL 42 08/31/2017   LDLCALC 84 08/31/2017   TRIG 58 08/31/2017   CHOLHDL 3.3 08/31/2017     Chemistry      Component Value Date/Time   NA 137 08/31/2017 0826   K 4.8 08/31/2017 0826   CL 99 08/31/2017 0826   CO2 24 08/31/2017 0826   BUN 31 (H) 08/31/2017 0826   CREATININE 2.11 (H) 08/31/2017 0826   CREATININE 1.73 (H) 02/02/2017 1205      Component Value Date/Time   CALCIUM 9.1 08/31/2017 0826   ALKPHOS 48 08/31/2017 0826   AST 12 08/31/2017 0826   ALT 10 08/31/2017 0826   BILITOT 0.4 08/31/2017 0826     Fasting glucose 157  Lab Results  Component Value Date   TSH 2.580 08/31/2017   Lab Results  Component Value Date   WBC 6.6 08/31/2017   HGB 13.0 08/31/2017   HCT 40.4 08/31/2017   MCV 96 08/31/2017   PLT 155 08/31/2017    ASSESSMENT/PLAN:  Type II diabetes mellitus with nephropathy (HCC) - A1c<7 but higher sugars in the morning. Check BID and change glipizide to 1/2 tab BID. reviewed diet  Dyslipidemia - reviewed low cholesterol diet, goals.  HDL dropped, and LDL went up from last check. Cont simvastatin -  Plan: simvastatin (ZOCOR) 20 MG tablet  Essential hypertension, benign - higher today than usually in our office, and higher than at home. Reviewed low Na diet - Plan: hydrochlorothiazide (HYDRODIURIL) 25 MG tablet, losartan (COZAAR) 50 MG tablet  Type 2 diabetes with nephropathy (HCC) - Plan: glipiZIDE (GLUCOTROL) 5 MG tablet, saxagliptin HCl (ONGLYZA) 2.5 MG TABS tablet, pioglitazone (ACTOS) 45 MG tablet  Essential hypertension, benign - Plan: hydrochlorothiazide (HYDRODIURIL) 25 MG tablet, losartan (COZAAR) 50 MG tablet  Will call to get diabetes eye exam notes from December.  F/u as scheduled in December for wellness visit.   Change your glipizide to 1/2 tablet twice daily (before breakfast and dinner).  Please, at least for the next 2 weeks, check your sugars twice daily.  Once in the morning, and once 2 hours after dinner or at bedtime.  Keep track on  a sheet of paper and mail/fax/email [place] the list. Use the comments section to put how you're taking your meds (if any changes are made) and any dietary changes that could account for higher sugars.  We discussed that the HDL (good cholesterol) dropped, and that the LDL (bad cholesterol) went up some. Ideally we would like that below 70.  Continue to limit the fat, sugar and cholesterol in your diet, and continue regular exercise. Try and limit the sweets in your diet. Checking 2 hours after a meal (or snack) can give good feedback as to whether you may be eating something that you should try and avoid (rather than not checking after being "bad").  Continue to use Flonase. You can cut back on the cetirizine and mucinex if you are much better, but can restart either if you feel like it was helping.

## 2017-09-05 ENCOUNTER — Encounter: Payer: Self-pay | Admitting: Family Medicine

## 2017-09-05 ENCOUNTER — Ambulatory Visit: Payer: Medicare Other | Admitting: Family Medicine

## 2017-09-05 VITALS — BP 132/70 | HR 60 | Ht 75.5 in | Wt 256.0 lb

## 2017-09-05 DIAGNOSIS — E785 Hyperlipidemia, unspecified: Secondary | ICD-10-CM | POA: Diagnosis not present

## 2017-09-05 DIAGNOSIS — E1121 Type 2 diabetes mellitus with diabetic nephropathy: Secondary | ICD-10-CM

## 2017-09-05 DIAGNOSIS — I10 Essential (primary) hypertension: Secondary | ICD-10-CM | POA: Diagnosis not present

## 2017-09-05 MED ORDER — PIOGLITAZONE HCL 45 MG PO TABS: ORAL_TABLET | ORAL | 5 refills | Status: DC

## 2017-09-05 MED ORDER — HYDROCHLOROTHIAZIDE 25 MG PO TABS: ORAL_TABLET | ORAL | 5 refills | Status: DC

## 2017-09-05 MED ORDER — LOSARTAN POTASSIUM 50 MG PO TABS: ORAL_TABLET | ORAL | 5 refills | Status: DC

## 2017-09-05 MED ORDER — GLIPIZIDE 5 MG PO TABS: 2.5000 mg | ORAL_TABLET | Freq: Two times a day (BID) | ORAL | 5 refills | Status: DC

## 2017-09-05 MED ORDER — SAXAGLIPTIN HCL 2.5 MG PO TABS: ORAL_TABLET | ORAL | 5 refills | Status: DC

## 2017-09-05 MED ORDER — SIMVASTATIN 20 MG PO TABS: ORAL_TABLET | ORAL | 5 refills | Status: DC

## 2017-09-05 NOTE — Patient Instructions (Signed)
  Change your glipizide to 1/2 tablet twice daily (before breakfast and dinner).  Please, at least for the next 2 weeks, check your sugars twice daily.  Once in the morning, and once 2 hours after dinner or at bedtime.  Keep track on a sheet of paper and mail/fax/email [place] the list. Use the comments section to put how you're taking your meds (if any changes are made) and any dietary changes that could account for higher sugars.  We discussed that the HDL (good cholesterol) dropped, and that the LDL (bad cholesterol) went up some. Ideally we would like that below 70.  Continue to limit the fat, sugar and cholesterol in your diet, and continue regular exercise. Try and limit the sweets in your diet. Checking 2 hours after a meal (or snack) can give good feedback as to whether you may be eating something that you should try and avoid (rather than not checking after being "bad").  Continue to use Flonase. You can cut back on the cetirizine and mucinex if you are much better, but can restart either if you feel like it was helping.

## 2017-09-07 ENCOUNTER — Telehealth: Payer: Self-pay | Admitting: Family Medicine

## 2017-09-07 NOTE — Telephone Encounter (Signed)
Received requested diabetic eye exam. Sending back for review.

## 2017-09-25 ENCOUNTER — Other Ambulatory Visit: Payer: Self-pay | Admitting: Family Medicine

## 2017-09-25 DIAGNOSIS — E1121 Type 2 diabetes mellitus with diabetic nephropathy: Secondary | ICD-10-CM

## 2017-09-26 ENCOUNTER — Telehealth: Payer: Self-pay | Admitting: Family Medicine

## 2017-09-26 NOTE — Telephone Encounter (Signed)
Pt's wife came in and dropped off bp readings. Sending back for review.

## 2017-09-28 NOTE — Telephone Encounter (Signed)
Advise pt--sugar log reviewed.  Definitely a little higher in the morning still, but since A1c was okay, no changes for now.  Continue the 1/2 pill twice daily of glipizide (as we changed last visit), and all other meds.

## 2017-09-28 NOTE — Telephone Encounter (Signed)
Reviewed sugars from 7/9-7/21: Fasting 112-168 (mostly 140-150, only once <135); evening 101-169 (full range covered, 104 when didn't have dinner)

## 2017-09-29 NOTE — Telephone Encounter (Signed)
Patient advised.

## 2017-10-17 ENCOUNTER — Ambulatory Visit: Payer: Medicare Other | Admitting: Family Medicine

## 2017-11-14 HISTORY — PX: KNEE ARTHROSCOPY: SHX127

## 2017-12-08 ENCOUNTER — Other Ambulatory Visit: Payer: Medicare Other

## 2017-12-12 ENCOUNTER — Ambulatory Visit: Payer: Medicare Other | Admitting: Family Medicine

## 2018-01-16 NOTE — Progress Notes (Signed)
Mr. Halpin received his flu shot today to the LT deltoid at the [facility] by the undersigned.  Lot #[id number] NDC: 41660-630-16 Mfg: [id number] Exp: 08/29/18

## 2018-01-30 ENCOUNTER — Other Ambulatory Visit: Payer: Self-pay | Admitting: Family Medicine

## 2018-01-30 DIAGNOSIS — E1121 Type 2 diabetes mellitus with diabetic nephropathy: Secondary | ICD-10-CM

## 2018-02-01 DIAGNOSIS — N183 Chronic kidney disease, stage 3 unspecified: Secondary | ICD-10-CM | POA: Insufficient documentation

## 2018-02-01 NOTE — Progress Notes (Signed)
Chief Complaint  Patient presents with  . Medicare Wellness    fasting AWV/CPE. Sees eye doctor in Dec yearly. Has a scab on his lower left leg he would like you to look at.   . Medication Refill    wants to make he doesn't forget to ask for refill on sildenafil.    MINOR IDEN is a 72 y.o. male who presents for annual physical exam, Medicare wellness visit and follow-up on chronic medical conditions.  He has the following concerns:  He had a large scab on his left shin, first noted 2 months ago.  It fell off and seems to be healing, wants it checked, afraid it could be cancer.  He wasn't aware of any injury, never looked bruised or bled, just scabbed over. He sees dermatologist, but not until May.  He had arthroscopic knee surgery on the right knee in September.  He was just released 3 weeks ago to return to regular exercise routine.  He is upset that he gained 4#, but otherwise is doing well.  Diabetes. Sugars are still running higher in the mornings, in the 130's-140's, up to 160. Runs 130-140 in the evenings. At his visit in July, his morning sugars were also running higher and we had discussed changing his glipizide to 1/2 tablet twice daily (before breakfast and dinner).  In error, he split the onglyza instead of the glipizide.  It is hard to split, and crumbles some.  He is taking the full glipizide tablet before dinner. Denies hypoglycemia, polydipsia, polyuria (up 3-4x/night, unchanged, able to fall back asleep easily).Denies any numbness, tingling, lesions/sores. Diabetic eye exam was 01/2017, no retinopathy.  [place] avoidscarbs, no longer eats muchpotatoes, corn or rice,continues to avoid sweets (just once or twice a week), sugar and limit portions.   Hypertension follow-up: Blood pressures are running 120/65 at the gym, higher at other doctors, lowest at the gym.  130/75 when he checks at [employer]. Denies dizziness,headaches, chest pain. Denies side effects of  medications.  CKD, stage 3.  Sees nephrologist regularly, last note we have was from 01/2017; he states he went in May, and that he now only needs to go yearly.  Hyperlipidemia:  He is compliant with taking simvastatin, denies side effects.  Last lipids were checked in July.  Lab Results  Component Value Date   CHOL 138 08/31/2017   HDL 42 08/31/2017   LDLCALC 84 08/31/2017   TRIG 58 08/31/2017   CHOLHDL 3.3 08/31/2017    H/o hypogonadism--he has been off testosterone since 2015. Last level was checked in 04/2015, normal at 382.  (he previously was treated with testosterone, stopped when levels got high, 2015). Hecontinues to have some ED. He used to use Levitra, which was too expensive.  He has been using sildenafil, just 2 (40mg ) with only modest benefit. Hasn't tried a higher dose Libido is okay, denies fatigue. Requesting refill.   Immunization History  Administered Date(s) Administered  . DTaP 07/25/2008  . Influenza Split 11/17/2011, 12/30/2013, 12/26/2014  . Influenza, High Dose Seasonal PF 01/30/2013, 11/12/2015, 12/08/2016  . Pneumococcal Conjugate-13 03/04/2014  . Pneumococcal Polysaccharide-23 11/29/2009, 04/09/2015  . Tdap 07/25/2008  . Zoster 09/05/2013  got flu [facility]16/18 at [facility] Last colonoscopy: 05/05/10  Last PSA: 01/2017, 3.6 Lab Results  Component Value Date   PSA 3.6 02/02/2017   PSA 2.9 11/12/2015   PSA 2.99 10/03/2014   Dentist: goes 2-3 times per year. Ophtho: yearly, due now Exercise:  Elliptical 4.5 miles,  walks 1 mile, and weights every day while at home. Walks 2.5 miles/day when at the beach (10-14 days/month at least).  Other doctors caring for patient include: Dentist: Dr. Pamella Pert Ophtho: Dr. Satira Sark ([place] ophtho) GI: Dr. Collene Mares  Nephro: Dr. Marval Regal (saw a male last time at same practice) Cardiologist: Dr. Claiborne Billings Dermatologist: Dr. Renda Rolls Ortho: Dr. Victorino December (also saw Dr. Delilah Shan for knee pain, at [facility])  Depression screen: Negative Fall screen: negative Functional Status Screen: remarkable for issues with walking due to knee surgery, improving every day. Mini-Cog screen: normal  End of Life Discussion: Patient does not havea living will and medical power of attorney. He has been told about this the last few years, but still hasn't done. "It's morbid to think about".  Past Medical History:  Diagnosis Date  . Chronic kidney disease   . Chronic renal insufficiency, stage II (mild)   . Coronary artery disease    (?pt denies, not mentioned in Dr. Lowella Fairy notes 2014)  . Diabetes mellitus   . Diverticulosis   . Erectile dysfunction   . GERD (gastroesophageal reflux disease)   . Hay fever   . Hemorrhoids    internal and external  . Hyperlipidemia   . Hypertension   . Hypogonadism male     Past Surgical History:  Procedure Laterality Date  . CIRCUMCISION  age 42  . DENTAL SURGERY     implants  . KNEE ARTHROSCOPY Right 11/14/2017   Dr. Victorino December; partial medial meniscectomy    Social History   Socioeconomic History  . Marital status: Married    Spouse name: Not on file  . Number of children: 0  . Years of education: Not on file  . Highest education level: Not on file  Occupational History  . Occupation: police officer--retired    Employer: [employer]  . Financial resource strain: Not on file  . Food insecurity:    Worry: Not on file    Inability: Not on file  . Transportation needs:    Medical: Not on file    Non-medical: Not on file  Tobacco Use  . Smoking status: Never Smoker  . Smokeless tobacco: Never Used  Substance and Sexual Activity  . Alcohol use: Yes    Alcohol/week: 4.0 standard drinks    Types: 4 Glasses of wine per week    Comment: 1-2 glass of wine 2 nights/week  . Drug use: No  . Sexual activity: Yes  Lifestyle  . Physical activity:    Days per week: Not on file    Minutes per session: Not on file   . Stress: Not on file  Relationships  . Social connections:    Talks on phone: Not on file    Gets together: Not on file    Attends religious service: Not on file    Active member of club or organization: Not on file    Attends meetings of clubs or organizations: Not on file    Relationship status: Not on file  . Intimate partner violence:    Fear of current or ex partner: Not on file    Emotionally abused: Not on file    Physically abused: Not on file    Forced sexual activity: Not on file  Other Topics Concern  . Not on file  Social History Narrative   Retired 06/2011.  Lives with wife, 1 cat.   Has a place on [place], spends 1-2 weeks/month there  Family History  Problem Relation Age of Onset  . Lymphoma Father   . Cancer Father        lymphoma  . Hypertension Father   . Lymphoma Brother   . Cancer Brother        lymphoma  . Dementia Mother   . Diabetes Mother   . Diabetes Sister   . Cancer Brother        skin cancer  . Diabetes Brother   . Lymphoma Paternal Grandfather   . Cancer Paternal Grandfather        lymphoma  . Diabetes Brother   . Kidney disease Brother   . COPD Brother   . Heart disease Neg Hx     Outpatient Encounter Medications as of 02/02/2018  Medication Sig Note  . aspirin 81 MG tablet Take 81 mg by mouth daily.     Marland Kitchen glipiZIDE (GLUCOTROL) 5 MG tablet TAKE 1 TABLET BY MOUTH DAILY BEFORE SUPPER   . hydrochlorothiazide (HYDRODIURIL) 25 MG tablet TAKE 1 TABLET(25 MG) BY MOUTH DAILY   . losartan (COZAAR) 50 MG tablet TAKE 1 TABLET(50 MG) BY MOUTH DAILY   . Omega-3 Fatty Acids (FISH OIL) 1000 MG CAPS Take 2 capsules by mouth daily.   . ONE TOUCH ULTRA TEST test strip USE AS DIRECTED   . ONETOUCH DELICA LANCETS FINE MISC U UTD 04/09/2015: Received from: External Pharmacy  . pioglitazone (ACTOS) 45 MG tablet TAKE 1 TABLET(45 MG) BY MOUTH DAILY   . saxagliptin HCl (ONGLYZA) 2.5 MG TABS tablet TAKE 1 TABLET(2.5 MG) BY MOUTH DAILY 02/02/2018: Has been  taking 1/2 BID (in error, should have done this with the glipizide)  . sildenafil (REVATIO) 20 MG tablet Take 1 tablet (20 mg total) by mouth 3 (three) times daily. 02/02/2018: Uses 2 tablets prn for ED  . simvastatin (ZOCOR) 20 MG tablet TAKE 1 TABLET(20 MG) BY MOUTH AT BEDTIME   . fluticasone (FLONASE) 50 MCG/ACT nasal spray Place 1 spray into both nostrils 2 (two) times daily.   Marland Kitchen guaiFENesin (MUCINEX PO) Take 1 tablet by mouth as needed.   . valACYclovir (VALTREX) 1000 MG tablet Take 2,000 mg by mouth as needed. Reported on 04/09/2015 10/10/2014: From her dermatologist [facility]); prn fever blisters  . [DISCONTINUED] cetirizine HCl (ZYRTEC) 1 MG/ML solution Take 10 mg by mouth daily. 09/05/2017: TABLETS--not liquid  . [DISCONTINUED] glipiZIDE (GLUCOTROL) 5 MG tablet Take 0.5 tablets (2.5 mg total) by mouth 2 (two) times daily before a meal.    No facility-administered encounter medications on file as of 02/02/2018.     Allergies  Allergen Reactions  . Ace Inhibitors     cough    ROS: The patient denies anorexia, fever, headaches, vision loss, decreased hearing, ear pain, hoarseness, chest pain, palpitations, dizziness, syncope, dyspnea on exertion, cough, swelling, nausea, vomiting, diarrhea, constipation, abdominal pain, melena, hematochezia, hematuria, incontinence, weakened urine stream, dysuria, genital lesions, joint pains, numbness, tingling, weakness, tremor, depression, anxiety, abnormal bleeding/bruising, or enlarged lymph nodes.  Up 3-4 times/night to void, and every 2-3 hours during the day. No urinary hesitancy. +ED, as per HPI +snoring per wife.  Wakes up feeling refreshed, no daytime somnolence. Is aware that he would wake up startled/snoring when laying on his back, but he no longer sleeps on his back. Has some mild hay fever/allergies currently.  Hasn't taken anything for it. Occasional heartburn at night, especially after fried foods. +weight gain   PHYSICAL EXAM:  BP  138/70   Pulse 76  Ht 6' 3.25" (1.911 m)   Wt 265 lb 9.6 oz (120.5 kg)   BMI 32.98 kg/m   136/50 on repeat by MD  Wt Readings from Last 3 Encounters:  02/02/18 265 lb 9.6 oz (120.5 kg)  09/05/17 256 lb (116.1 kg)  02/02/17 259 lb (117.5 kg)    General Appearance:  Alert, cooperative, no distress, appears stated age   Head:  Normocephalic, without obvious abnormality, atraumatic   Eyes:  PERRL, conjunctiva/corneas clear, EOM's intact, fundi benign   Ears:  Normal TM's and external ear canals  Nose:  Nares normal, mucosa normal, no drainage or sinus tenderness   Throat:  Lips, mucosa, and tongue normal; teeth and gums normal   Neck:  Supple, no lymphadenopathy; thyroid: no enlargement/ tenderness/nodules; no carotid bruit or JVD   Back:  Spine nontender, no curvature, ROM normal, no CVA tenderness   Lungs:  Clear to auscultation bilaterally without wheezes, rales or ronchi; respirations unlabored   Chest Wall:  No tenderness or deformity   Heart:  Regular rate and rhythm, S1 and S2 normal, no murmur, rub or gallop   Breast Exam:  No chest wall tenderness, masses or gynecomastia   Abdomen:  Soft, non-tender, nondistended, normoactive bowel sounds, no masses, no hepatosplenomegaly. Mild ventral hernia, nontender.  Genitalia:  Normal male external genitalia without lesions. + penile piercing. Testicles without masses. No inguinal hernias.   Rectal:  Normal sphincter tone, no masses or tenderness; guaiac negative stool. Prostate smooth, no nodules, not enlarged.   Extremities:  No clubbing, cyanosis; no edema  Pulses:  2+ and symmetric all extremities   Skin:  Skin texture, turgor normal, no rashes or lesions. Normal diabetic foot exam. Skin is dry and flaky on the lower legs bilaterally. There is a scab in the center of the left shin that is partly off/separated.  Nontender. Sebaceous cyst (with large dark central core) at  left upper shoulder/back  Lymph nodes:  Cervical, supraclavicular, and axillary nodes normal   Neurologic:  CNII-XII intact, normal strength, sensation and gait; reflexes 2+ and symmetric throughout   Psych:   Normal mood, affect, hygiene and grooming       Diabetic foot exam--normal.   Lab Results  Component Value Date   HGBA1C 7.1 (A) 02/02/2018    ASSESSMENT/PLAN:  Annual physical exam - Plan: POCT Urinalysis DIP (Proadvantage Device)  Medicare annual wellness visit, subsequent  Type II diabetes mellitus with nephropathy ([place]) - borderline control, sugars higher recently likely contrib by surgery/less activity. Change glipizide to 1/2 BID rather than qd - Plan: HgB A1c, Comprehensive metabolic panel  Dyslipidemia - Plan: Lipid panel, simvastatin (ZOCOR) 20 MG tablet  Essential hypertension, benign - well controlled per #'s at home and gym, borderline systolic here - Plan: hydrochlorothiazide (HYDRODIURIL) 25 MG tablet, losartan (COZAAR) 50 MG tablet  Screening for prostate cancer - Plan: PSA  Medication monitoring encounter - Plan: Comprehensive metabolic panel, Lipid panel  Erectile dysfunction, unspecified erectile dysfunction type - can try higher doses of sildenafil, up to 100mg  if needed. Change pharmacy to [facility] to get larger quantities/cost - Plan: sildenafil (REVATIO) 20 MG tablet  CKD (chronic kidney disease) stage 3, GFR 30-59 ml/min (HCC)  Type 2 diabetes with nephropathy (HCC) - Plan: glipiZIDE (GLUCOTROL) 5 MG tablet, pioglitazone (ACTOS) 45 MG tablet, saxagliptin HCl (ONGLYZA) 2.5 MG TABS tablet  Sebaceous cyst - on upper left back/shoulder, not infected  Gastroesophageal reflux disease without esophagitis - mild, intermittent.  Diet/reflux precautions and OTC meds  reviewed for prn use  Dyslipidemia - reviewed low cholesterol diet, goals.  HDL dropped, and LDL went up from last check. Cont simvastatin - Plan: Lipid panel,  simvastatin (ZOCOR) 20 MG tablet   Recommended at least 30 minutes of aerobic activity at least 5 days/week, weight-bearing exercise at least 2x/week; proper sunscreen use reviewed; healthy diet and alcohol recommendations (less than or equal to 2 drinks/day) reviewed; regular seatbelt use; changing batteries in smoke detectors. Immunization recommendations discussed--Continue yearly flu shots. Shingrix recommended, to get at pharmacy;risks/side effects reviewed. tetanus booster will be due 06/2018, to get from the pharmacy. Colonoscopy recommendations reviewed, UTD  MOST form updated and reviewed.  Full Code, Full care  Reviewed living will and healthcare power of attorney info again--he was given copies of paperwork again. Encouraged to fill out; counseled extensively re: reasons to discuss with wife, and that she should also fill out.   Medicare Attestation I have personally reviewed: The patient's medical and social history Their use of alcohol, tobacco or illicit drugs Their current medications and supplements The patient's functional ability including ADLs,fall risks, home safety risks, cognitive, and hearing and visual impairment Diet and physical activities Evidence for depression or mood disorders  The patient's weight, height and BMI have been recorded in the chart.  I have made referrals, counseling, and provided education to the patient based on review of the above and I have provided the patient with a written personalized care plan for preventive services.

## 2018-02-01 NOTE — Patient Instructions (Addendum)
HEALTH MAINTENANCE RECOMMENDATIONS:  It is recommended that you get at least 30 minutes of aerobic exercise at least 5 days/week (for weight loss, you may need as much as 60-90 minutes). This can be any activity that gets your heart rate up. This can be divided in 10-15 minute intervals if needed, but try and build up your endurance at least once a week.  Weight bearing exercise is also recommended twice weekly.  Eat a healthy diet with lots of vegetables, fruits and fiber.  "Colorful" foods have a lot of vitamins (ie green vegetables, tomatoes, red peppers, etc).  Limit sweet tea, regular sodas and alcoholic beverages, all of which has a lot of calories and sugar.  Up to 2 alcoholic drinks daily may be beneficial for men (unless trying to lose weight, watch sugars).  Drink a lot of water.  Sunscreen of at least SPF 30 should be used on all sun-exposed parts of the skin when outside between the hours of 10 am and 4 pm (not just when at beach or pool, but even with exercise, golf, tennis, and yard work!)  Use a sunscreen that says "broad spectrum" so it covers both UVA and UVB rays, and make sure to reapply every 1-2 hours.  Remember to change the batteries in your smoke detectors when changing your clock times in the spring and fall.  Use your seat belt every time you are in a car, and please drive safely and not be distracted with cell phones and texting while driving.   James Glover , Thank you for taking time to come for your Medicare Wellness Visit. I appreciate your ongoing commitment to your health goals. Please review the following plan we discussed and let me know if I can assist you in the future.    This is a list of the screening recommended for you and due dates:  Health Maintenance  Topic Date Due  . Complete foot exam   06/23/2017  . Flu Shot  09/29/2017  . Eye exam for diabetics  02/09/2018  . Hemoglobin A1C  03/03/2018  . Tetanus Vaccine  07/26/2018  . Colon Cancer Screening   05/14/2020  .  Hepatitis C: One time screening is recommended by Center for Disease Control  (CDC) for  adults born from [birth year] through 1964.   Completed  . Pneumonia vaccines  Completed   You had your flu shot last month, continue to get the high dose flu shot yearly. We did you A1c today (instead of in January). Your foot exam was done today. You will need to get tetanus booster from the pharmacy, when due in 06/2018 [facility] doesn't cover for it to be given at the doctor's office)  I recommend getting the new shingles vaccine (Shingrix). You will need to check with your insurance to see if it is covered, and if covered by Medicare Part D, you need to get from the pharmacy rather than our office.  It is a series of 2 injections, spaced 2 months apart.  You are due for your annual diabetic eye exam, be sure to schedule it.  Get a carbon monoxide detector for your home--one for each floor.  Your A1c is up some--now above 7, likely due to change in activity related to your knee surgery. Please be sure to limit your sweets and portions, try and lose the weight you gained, and continue daily exercise.  Do not cut your onglyza--take 1 full pill daily. I do want you to cut the Glipizide,  taking 1/2 tablet before breakfast, and the other half before dinner. Continue all other medications. We will contact you with your test results soon.

## 2018-02-02 ENCOUNTER — Encounter: Payer: Self-pay | Admitting: Family Medicine

## 2018-02-02 ENCOUNTER — Ambulatory Visit: Payer: Medicare Other | Admitting: Family Medicine

## 2018-02-02 VITALS — BP 138/70 | HR 76 | Ht 75.25 in | Wt 265.6 lb

## 2018-02-02 DIAGNOSIS — N183 Chronic kidney disease, stage 3 unspecified: Secondary | ICD-10-CM

## 2018-02-02 DIAGNOSIS — E1121 Type 2 diabetes mellitus with diabetic nephropathy: Secondary | ICD-10-CM | POA: Diagnosis not present

## 2018-02-02 DIAGNOSIS — Z5181 Encounter for therapeutic drug level monitoring: Secondary | ICD-10-CM

## 2018-02-02 DIAGNOSIS — I10 Essential (primary) hypertension: Secondary | ICD-10-CM

## 2018-02-02 DIAGNOSIS — N529 Male erectile dysfunction, unspecified: Secondary | ICD-10-CM

## 2018-02-02 DIAGNOSIS — Z125 Encounter for screening for malignant neoplasm of prostate: Secondary | ICD-10-CM

## 2018-02-02 DIAGNOSIS — Z0000 Encounter for general adult medical examination without abnormal findings: Secondary | ICD-10-CM

## 2018-02-02 DIAGNOSIS — K219 Gastro-esophageal reflux disease without esophagitis: Secondary | ICD-10-CM

## 2018-02-02 DIAGNOSIS — E785 Hyperlipidemia, unspecified: Secondary | ICD-10-CM | POA: Diagnosis not present

## 2018-02-02 DIAGNOSIS — L723 Sebaceous cyst: Secondary | ICD-10-CM

## 2018-02-02 LAB — POCT URINALYSIS DIP (PROADVANTAGE DEVICE)
Bilirubin, UA: NEGATIVE
Blood, UA: NEGATIVE
Glucose, UA: NEGATIVE mg/dL
Ketones, POC UA: NEGATIVE mg/dL
Leukocytes, UA: NEGATIVE
Nitrite, UA: NEGATIVE
Protein Ur, POC: NEGATIVE mg/dL
Specific Gravity, Urine: 1.02
Urobilinogen, Ur: NEGATIVE
pH, UA: 6 (ref 5.0–8.0)

## 2018-02-02 LAB — POCT GLYCOSYLATED HEMOGLOBIN (HGB A1C): Hemoglobin A1C: 7.1 % — AB (ref 4.0–5.6)

## 2018-02-02 MED ORDER — LOSARTAN POTASSIUM 50 MG PO TABS: ORAL_TABLET | ORAL | 5 refills | Status: DC

## 2018-02-02 MED ORDER — PIOGLITAZONE HCL 45 MG PO TABS: ORAL_TABLET | ORAL | 5 refills | Status: DC

## 2018-02-02 MED ORDER — SAXAGLIPTIN HCL 2.5 MG PO TABS: ORAL_TABLET | ORAL | 5 refills | Status: DC

## 2018-02-02 MED ORDER — GLIPIZIDE 5 MG PO TABS: 2.5000 mg | ORAL_TABLET | Freq: Two times a day (BID) | ORAL | 5 refills | Status: DC

## 2018-02-02 MED ORDER — SILDENAFIL CITRATE 20 MG PO TABS: ORAL_TABLET | ORAL | 0 refills | Status: DC

## 2018-02-02 MED ORDER — HYDROCHLOROTHIAZIDE 25 MG PO TABS: ORAL_TABLET | ORAL | 5 refills | Status: DC

## 2018-02-03 LAB — COMPREHENSIVE METABOLIC PANEL
ALT: 8 IU/L (ref 0–44)
AST: 14 IU/L (ref 0–40)
Albumin/Globulin Ratio: 1.7 (ref 1.2–2.2)
Albumin: 4.3 g/dL (ref 3.5–4.8)
Alkaline Phosphatase: 48 IU/L (ref 39–117)
BUN/Creatinine Ratio: 19 (ref 10–24)
BUN: 34 mg/dL — ABNORMAL HIGH (ref 8–27)
Bilirubin Total: 0.6 mg/dL (ref 0.0–1.2)
CO2: 24 mmol/L (ref 20–29)
Calcium: 9.2 mg/dL (ref 8.6–10.2)
Chloride: 97 mmol/L (ref 96–106)
Creatinine, Ser: 1.79 mg/dL — ABNORMAL HIGH (ref 0.76–1.27)
GFR calc Af Amer: 43 mL/min/{1.73_m2} — ABNORMAL LOW (ref >59)
GFR calc non Af Amer: 37 mL/min/{1.73_m2} — ABNORMAL LOW (ref >59)
Globulin, Total: 2.6 g/dL (ref 1.5–4.5)
Glucose: 195 mg/dL — ABNORMAL HIGH (ref 65–99)
Potassium: 5.1 mmol/L (ref 3.5–5.2)
Sodium: 136 mmol/L (ref 134–144)
Total Protein: 6.9 g/dL (ref 6.0–8.5)

## 2018-02-03 LAB — LIPID PANEL
Chol/HDL Ratio: 2.9 ratio (ref 0.0–5.0)
Cholesterol, Total: 168 mg/dL (ref 100–199)
HDL: 58 mg/dL (ref >39)
LDL Calculated: 98 mg/dL (ref 0–99)
Triglycerides: 60 mg/dL (ref 0–149)
VLDL Cholesterol Cal: 12 mg/dL (ref 5–40)

## 2018-02-03 LAB — PSA: Prostate Specific Ag, Serum: 5.4 ng/mL — ABNORMAL HIGH (ref 0.0–4.0)

## 2018-02-03 MED ORDER — SIMVASTATIN 20 MG PO TABS: ORAL_TABLET | ORAL | 5 refills | Status: DC

## 2018-05-09 LAB — HM DIABETES EYE EXAM

## 2018-06-14 ENCOUNTER — Encounter: Payer: Self-pay | Admitting: Family Medicine

## 2018-06-14 ENCOUNTER — Other Ambulatory Visit: Payer: Self-pay | Admitting: Urology

## 2018-06-14 ENCOUNTER — Other Ambulatory Visit (HOSPITAL_COMMUNITY): Payer: Self-pay | Admitting: Urology

## 2018-06-14 DIAGNOSIS — C61 Malignant neoplasm of prostate: Secondary | ICD-10-CM | POA: Insufficient documentation

## 2018-06-16 ENCOUNTER — Encounter (HOSPITAL_COMMUNITY)
Admission: RE | Admit: 2018-06-16 | Discharge: 2018-06-16 | Disposition: A | Discharge status: Home / Self Care | Payer: Medicare Other | Source: Ambulatory Visit | Attending: Urology | Admitting: Urology

## 2018-06-16 ENCOUNTER — Other Ambulatory Visit: Payer: Self-pay

## 2018-06-16 DIAGNOSIS — C61 Malignant neoplasm of prostate: Secondary | ICD-10-CM | POA: Insufficient documentation

## 2018-06-16 MED ORDER — TECHNETIUM TC 99M MEDRONATE IV KIT: 21.8000 | PACK | Freq: Once | INTRAVENOUS | Status: DC | PRN

## 2018-07-19 NOTE — Progress Notes (Signed)
GU Location of Tumor / Histology: prostatic adenocarcinoma  If Prostate Cancer, Gleason Score is (4 + 5) and PSA is (5.4). Prostate volume: 31 mL.  05/03/2018 PSA  4.77 01/2018 PSA  5.4 2018  PSA  3.6 2016  PSA  3 2015  PSA  2.8  Biopsies of prostate (if applicable) revealed:   Past/Anticipated interventions by urology, if any: prostate biopsy, CT (negative), bone scan (negative), referral for consideration of radiation plus hormones  Past/Anticipated interventions by medical oncology, if any: no  Weight changes, if any: no  Bowel/Bladder complaints, if any: IPSS 8. SHIM 17 with aid of Viagra. Denies dysuria. Denies hematuria. Reports hematospermia has resolved. Denies urinary leakage or incontinence. Reports urgency, nocturia, and ED.   Nausea/Vomiting, if any: no  Pain issues, if any:  None  SAFETY ISSUES:  Prior radiation? No   Pacemaker/ICD? No  Possible current pregnancy? no, male patient  Is the patient on methotrexate? No  Current Complaints / other details:  73 year old male. Married. Retired Higher education careers adviser. Both father and brother had leukemia. AX: statins

## 2018-07-21 ENCOUNTER — Telehealth: Payer: Self-pay | Admitting: Radiation Oncology

## 2018-07-21 NOTE — Telephone Encounter (Signed)
Left message to verify appt thru [facility] for pre reg

## 2018-07-25 ENCOUNTER — Ambulatory Visit
Admission: RE | Admit: 2018-07-25 | Discharge: 2018-07-25 | Disposition: A | Discharge status: Home / Self Care | Payer: Medicare Other | Source: Ambulatory Visit | Attending: Radiation Oncology | Admitting: Radiation Oncology

## 2018-07-25 ENCOUNTER — Encounter: Payer: Self-pay | Admitting: Radiation Oncology

## 2018-07-25 ENCOUNTER — Other Ambulatory Visit: Payer: Self-pay

## 2018-07-25 VITALS — Temp 97.9°F | Ht 76.0 in | Wt 258.0 lb

## 2018-07-25 DIAGNOSIS — C61 Malignant neoplasm of prostate: Secondary | ICD-10-CM

## 2018-07-25 HISTORY — DX: Malignant neoplasm of prostate: C61

## 2018-07-25 NOTE — Progress Notes (Signed)
Radiation Oncology         [phone] ________________________________  Initial Outpatient Consultation - Conducted via WebEx due to current COVID-19 concerns for limiting patient exposure  Name: James Glover MRN: [id number]  Date: 07/25/2018  DOB: March 14, 1945  WU:[id number], Eve, MD  Alexis Frock, MD   REFERRING PHYSICIAN: Alexis Frock, MD  DIAGNOSIS: 73 y.o. gentleman with Stage T1c adenocarcinoma of the prostate with Gleason score of 4+5 and PSA of 4.77.    ICD-10-CM   1. Malignant neoplasm of prostate ([place]) C61   2. Prostate cancer [facility]) C61     HISTORY OF PRESENT ILLNESS: James Glover is a 73 y.o. male with a diagnosis of prostate cancer. He was noted to have an elevated PSA of 5.4 in December 2019 by his primary care physician, Dr. [name].  Accordingly, he was referred for evaluation in urology by Dr. Tresa Moore on 03/20/2018, and digital rectal examination was performed at that time revealing mild right-sided firmness, but no prostate nodules.  PSA was rechecked in March 2020 which remained elevated at 4.77. The patient proceeded to transrectal ultrasound with 12 biopsies of the prostate on 05/30/2018.  The prostate volume measured 31 cc.  Out of 12 core biopsies, 6 were positive.  The maximum Gleason score was 4+5, and this was seen in the right mid, right base lateral, right mid lateral, and right apex lateral. Additionally, there was Gleason 4+4 disease seen in the right apex, and Gleason 3+3 in the left apex.  Biopsies of prostate revealed:    Staging studies include CT scan of the abdomen/pelvis and bone scan performed in April 2020 which were both negative for metastatic disease. Of note, his past medical history is significant for type 2 DM, HTN and stage III chronic renal insufficiency.  The patient reviewed the biopsy and imaging results with his urologist and he has kindly been referred today for discussion of potential radiation treatment options.    PREVIOUS  RADIATION THERAPY: No  PAST MEDICAL HISTORY:  Past Medical History:  Diagnosis Date   Chronic kidney disease    Chronic renal insufficiency, stage II (mild)    Coronary artery disease    (?pt denies, not mentioned in Dr. Lowella Fairy notes 2014)   Diabetes mellitus    Diverticulosis    Erectile dysfunction    GERD (gastroesophageal reflux disease)    Hay fever    Hemorrhoids    internal and external   Hyperlipidemia    Hypertension    Hypogonadism male    Prostate cancer ([place])       PAST SURGICAL HISTORY: Past Surgical History:  Procedure Laterality Date   CIRCUMCISION  age 31   DENTAL SURGERY     implants   KNEE ARTHROSCOPY Right 11/14/2017   Dr. Victorino December; partial medial meniscectomy    FAMILY HISTORY:  Family History  Problem Relation Age of Onset   Lymphoma Father    Cancer Father        lymphoma   Hypertension Father    Lymphoma Brother    Cancer Brother        lymphoma   Dementia Mother    Diabetes Mother    Diabetes Sister    Cancer Brother        skin cancer   Diabetes Brother    Lymphoma Paternal Grandfather    Cancer Paternal Grandfather        lymphoma   Diabetes Brother    Kidney disease Brother  COPD Brother    Heart disease Neg Hx     SOCIAL HISTORY:  Social History   Socioeconomic History   Marital status: Married    Spouse name: Not on file   Number of children: 0   Years of education: Not on file   Highest education level: Not on file  Occupational History   Occupation: police officer--retired    Employer: [employer]   Financial resource strain: Not on file   Food insecurity:    Worry: Not on file    Inability: Not on file   Transportation needs:    Medical: Not on file    Non-medical: Not on file  Tobacco Use   Smoking status: Never Smoker   Smokeless tobacco: Never Used  Substance and Sexual Activity   Alcohol use: Yes    Alcohol/week: 4.0  standard drinks    Types: 4 Glasses of wine per week    Comment: 1-2 glass of wine 2 nights/week   Drug use: No   Sexual activity: Yes  Lifestyle   Physical activity:    Days per week: Not on file    Minutes per session: Not on file   Stress: Not on file  Relationships   Social connections:    Talks on phone: Not on file    Gets together: Not on file    Attends religious service: Not on file    Active member of club or organization: Not on file    Attends meetings of clubs or organizations: Not on file    Relationship status: Not on file   Intimate partner violence:    Fear of current or ex partner: Not on file    Emotionally abused: Not on file    Physically abused: Not on file    Forced sexual activity: Not on file  Other Topics Concern   Not on file  Social History Narrative   Retired 06/2011.  Lives with wife, 1 cat.   Has a place on [place], spends 1-2 weeks/month there   Has 9 siblings    ALLERGIES: Ace inhibitors  MEDICATIONS:  Current Outpatient Medications  Medication Sig Dispense Refill   aspirin 81 MG tablet Take 81 mg by mouth daily.       glipiZIDE (GLUCOTROL) 5 MG tablet Take 0.5 tablets (2.5 mg total) by mouth 2 (two) times daily before a meal. 30 tablet 5   hydrochlorothiazide (HYDRODIURIL) 25 MG tablet TAKE 1 TABLET(25 MG) BY MOUTH DAILY 30 tablet 5   losartan (COZAAR) 50 MG tablet TAKE 1 TABLET(50 MG) BY MOUTH DAILY 30 tablet 5   Omega-3 Fatty Acids (FISH OIL) 1000 MG CAPS Take 2 capsules by mouth daily.     ONE TOUCH ULTRA TEST test strip USE AS DIRECTED 100 each 3   ONETOUCH DELICA LANCETS FINE MISC U UTD  0   pioglitazone (ACTOS) 45 MG tablet TAKE 1 TABLET(45 MG) BY MOUTH DAILY 30 tablet 5   saxagliptin HCl (ONGLYZA) 2.5 MG TABS tablet TAKE 1 TABLET(2.5 MG) BY MOUTH DAILY 30 tablet 5   sildenafil (REVATIO) 20 MG tablet Take 2-5 tablets once daily as needed for erectile dysfunction 100 tablet 0   simvastatin (ZOCOR) 20 MG tablet  TAKE 1 TABLET(20 MG) BY MOUTH AT BEDTIME 30 tablet 5   fluconazole (DIFLUCAN) 200 MG tablet TK 1 T PO Q WEEK ON THE SAME DAY     fluticasone (FLONASE) 50 MCG/ACT nasal spray Place 1 spray into  both nostrils 2 (two) times daily.     guaiFENesin (MUCINEX PO) Take 1 tablet by mouth as needed.     valACYclovir (VALTREX) 1000 MG tablet Take 2,000 mg by mouth as needed. Reported on 04/09/2015     No current facility-administered medications for this encounter.     REVIEW OF SYSTEMS:  On review of systems, the patient reports that he is doing well overall. He denies any chest pain, shortness of breath, cough, fevers, chills, night sweats, or unintended weight changes. He denies any bowel disturbances, and denies abdominal pain, nausea or vomiting. He denies any new musculoskeletal or joint aches or pains. His IPSS was 8, indicating moderate urinary symptoms with urgency and nocturia. He denies dysuria, hematuria, leakage or incontinence and reports that his LUTS are not bothersome enough to warrant medication at this point. His SHIM was 17, indicating he does have mild erectile dysfunction with aid of Viagra. He reports hematospermia has resolved. A complete review of systems is obtained and is otherwise negative.  PHYSICAL EXAM:  Wt Readings from Last 3 Encounters:  07/25/18 258 lb (117 kg)  02/02/18 265 lb 9.6 oz (120.5 kg)  09/05/17 256 lb (116.1 kg)   Temp Readings from Last 3 Encounters:  07/25/18 97.9 F (36.6 C) (Oral)  01/31/15 97.6 F (36.4 C) (Oral)  11/17/11 97.8 F (36.6 C) (Oral)   BP Readings from Last 3 Encounters:  02/02/18 138/70  09/05/17 132/70  02/02/17 120/70   Pulse Readings from Last 3 Encounters:  02/02/18 76  09/05/17 60  02/02/17 60   Pain Assessment Pain Score: 0-No pain/10  In general this is a well appearing caucasian male in no acute distress. He is alert and oriented x4 and appropriate throughout the examination. Cardiopulmonary assessment is negative  for acute distress and he exhibits normal effort. Remainder of exam not performed in light of virtual consultation platform.  KPS = 90  100 - Normal; no complaints; no evidence of disease. 90   - Able to carry on normal activity; minor signs or symptoms of disease. 80   - Normal activity with effort; some signs or symptoms of disease. 20   - Cares for self; unable to carry on normal activity or to do active work. 60   - Requires occasional assistance, but is able to care for most of his personal needs. 50   - Requires considerable assistance and frequent medical care. 42   - Disabled; requires special care and assistance. 31   - Severely disabled; hospital admission is indicated although death not imminent. 52   - Very sick; hospital admission necessary; active supportive treatment necessary. 10   - Moribund; fatal processes progressing rapidly. 0     - Dead  Karnofsky DA, Abelmann [place], Craver LS and Burchenal [facility] [phone]) The use of the nitrogen mustards in the palliative treatment of carcinoma: with particular reference to bronchogenic carcinoma Cancer 1 634-56  LABORATORY DATA:  Lab Results  Component Value Date   WBC 6.6 08/31/2017   HGB 13.0 08/31/2017   HCT 40.4 08/31/2017   MCV 96 08/31/2017   PLT 155 08/31/2017   Lab Results  Component Value Date   NA 136 02/02/2018   K 5.1 02/02/2018   CL 97 02/02/2018   CO2 24 02/02/2018   Lab Results  Component Value Date   ALT 8 02/02/2018   AST 14 02/02/2018   ALKPHOS 48 02/02/2018   BILITOT 0.6 02/02/2018     RADIOGRAPHY: No results found.  IMPRESSION/PLAN: 1. 73 y.o. gentleman with Stage T1c adenocarcinoma of the prostate with Gleason Score of 4+5, and PSA of 4.77.  We discussed the patient's workup and outlined the nature of prostate cancer in this setting. The patient's T stage, Gleason's score, and PSA put him into the high risk group. Accordingly, he is eligible for a variety of potential treatment options including  prostatectomy or LT-ADT in combination with either 8 weeks of daily external beam radiation or brachytherapy boost followed by 5 weeks of daily external beam radiation. We discussed the available radiation techniques, and focused on the details and logistics of delivery. We discussed and outlined the risks, benefits, short and long-term effects associated with radiotherapy and compared and contrasted these with prostatectomy. We discussed the role of SpaceOAR in reducing the rectal toxicity associated with radiotherapy. We also detailed the role of ADT in the treatment of high risk prostate cancer and outlined the associated side effects that could be expected with this therapy.  He was encouraged to ask questions that were answered to his stated satisfaction.   At the end of the conversation the patient is interested in moving forward with LT-ADT in combination with an upfront brachytherapy boost with placement of SpaceOAR gel to reduce rectal toxicity from radiation followed by a 5 week course of daily radiotherapy. He has not received his first Lupron injection. We will share our discussion with Dr. Tresa Moore and assist with coordinating a follow up visit, first available, to initiate ADT now and move forward with scheduling a seed implant procedure to take place approximately two months after start of ADT.  Romie Jumper in our office will be working closely with him to coordinate OR scheduling and pre and post procedure appointments.  We will contact the pharmaceutical rep to ensure that [facility] is available at the time of procedure.  He will have a prostate MRI following his post-seed CT SIM to confirm appropriate distribution of the [place] and we will proceed with treatment planning/simulation in anticipation of beginning his 5-week course of daily radiotherapy shortly thereafter.  He appears to have a good understanding of his disease and our treatment recommendations which are of curative intent.  He is  comfortable with and in agreement with the stated plan.   This encounter was provided by telemedicine platform Webex.  The patient has given verbal consent for this type of encounter and has been advised to only accept a meeting of this type in a secure network environment. The time spent during this encounter was 60 minutes and approximately 50% of this time was spent in review of outside records and coordination of the patient's care. The attendants for this meeting include Tyler Pita MD, Freeman Caldron PA-C, scribe Clinton Sawyer, patient, James Glover and his wife. During the encounter, Tyler Pita MD, Ashlyn Bruning PA-C, and scribe Clinton Sawyer were located at [facility].  Patient, James Glover and wife were located at home.    Nicholos Johns, PA-C    Tyler Pita, MD  [facility] Direct Dial: [phone]   Fax: [phone] [email or web address]   Skype   LinkedIn  This document serves as a record of services personally performed by Tyler Pita, MD and Freeman Caldron, PA-C. It was created on their behalf by Rae Lips, a trained medical scribe. The creation of this record is based on the scribe's personal observations and the providers' statements to them. This document has been  checked and approved by the attending providers.

## 2018-07-25 NOTE — Progress Notes (Signed)
See progress note under physician encounter. 

## 2018-07-28 ENCOUNTER — Telehealth: Payer: Self-pay | Admitting: *Deleted

## 2018-07-28 ENCOUNTER — Other Ambulatory Visit: Payer: Self-pay | Admitting: Family Medicine

## 2018-07-28 DIAGNOSIS — E1121 Type 2 diabetes mellitus with diabetic nephropathy: Secondary | ICD-10-CM

## 2018-07-28 NOTE — Telephone Encounter (Signed)
CALLED PATIENT TO INFORM THAT I CALLED [facility] TO ASK THAT THEY SPEED UP THE PROCESS FOR HIS ADT, INFORMED PATIENT THAT I WAS PUT IN THE NURSES VM, PATIENT VERIFIED UNDERSTANDING THIS

## 2018-07-28 NOTE — Telephone Encounter (Signed)
Called patient to inform that Dr. Tresa Moore will be seeing him on 08-01-18 @ 9:45 am to discuss ADT, spoke with patient and he is aware of this

## 2018-08-01 ENCOUNTER — Other Ambulatory Visit: Payer: Self-pay | Admitting: Family Medicine

## 2018-08-01 ENCOUNTER — Encounter: Payer: Self-pay | Admitting: Medical Oncology

## 2018-08-01 ENCOUNTER — Telehealth: Payer: Self-pay | Admitting: Medical Oncology

## 2018-08-01 DIAGNOSIS — Z5181 Encounter for therapeutic drug level monitoring: Secondary | ICD-10-CM

## 2018-08-01 DIAGNOSIS — N183 Chronic kidney disease, stage 3 unspecified: Secondary | ICD-10-CM

## 2018-08-01 DIAGNOSIS — E785 Hyperlipidemia, unspecified: Secondary | ICD-10-CM

## 2018-08-01 DIAGNOSIS — E1121 Type 2 diabetes mellitus with diabetic nephropathy: Secondary | ICD-10-CM

## 2018-08-01 NOTE — Telephone Encounter (Signed)
Called James Glover to introduce myself as the prostate nurse navigator and discuss my role. I was unable to meet him 5/26 when he consulted with Dr. Tammi Klippel. He saw Dr. Tresa Moore today and given prescription for bicalutamide. He will return on 6/17 for Lupron. He will be scheduled for seed implant followed by radiation. I informed him I am working remotely and asked him to call me with questions or concerns and I will return his call.

## 2018-08-02 ENCOUNTER — Other Ambulatory Visit: Payer: Self-pay | Admitting: *Deleted

## 2018-08-02 DIAGNOSIS — E785 Hyperlipidemia, unspecified: Secondary | ICD-10-CM

## 2018-08-02 DIAGNOSIS — E119 Type 2 diabetes mellitus without complications: Secondary | ICD-10-CM

## 2018-08-02 DIAGNOSIS — I10 Essential (primary) hypertension: Secondary | ICD-10-CM

## 2018-08-02 DIAGNOSIS — Z5181 Encounter for therapeutic drug level monitoring: Secondary | ICD-10-CM

## 2018-08-09 ENCOUNTER — Encounter: Payer: Medicare Other | Admitting: Family Medicine

## 2018-08-14 ENCOUNTER — Other Ambulatory Visit: Payer: Medicare Other

## 2018-08-14 ENCOUNTER — Other Ambulatory Visit: Payer: Self-pay

## 2018-08-14 DIAGNOSIS — Z5181 Encounter for therapeutic drug level monitoring: Secondary | ICD-10-CM

## 2018-08-14 DIAGNOSIS — I10 Essential (primary) hypertension: Secondary | ICD-10-CM

## 2018-08-14 DIAGNOSIS — E785 Hyperlipidemia, unspecified: Secondary | ICD-10-CM

## 2018-08-14 DIAGNOSIS — E119 Type 2 diabetes mellitus without complications: Secondary | ICD-10-CM

## 2018-08-14 LAB — LIPID PANEL

## 2018-08-15 LAB — LIPID PANEL
Chol/HDL Ratio: 2.7 ratio (ref 0.0–5.0)
Cholesterol, Total: 142 mg/dL (ref 100–199)
HDL: 53 mg/dL (ref >39)
LDL Calculated: 79 mg/dL (ref 0–99)
Triglycerides: 48 mg/dL (ref 0–149)
VLDL Cholesterol Cal: 10 mg/dL (ref 5–40)

## 2018-08-15 LAB — COMPREHENSIVE METABOLIC PANEL
ALT: 13 IU/L (ref 0–44)
AST: 17 IU/L (ref 0–40)
Albumin/Globulin Ratio: 1.6 (ref 1.2–2.2)
Albumin: 3.9 g/dL (ref 3.7–4.7)
Alkaline Phosphatase: 41 IU/L (ref 39–117)
BUN/Creatinine Ratio: 15 (ref 10–24)
BUN: 31 mg/dL — ABNORMAL HIGH (ref 8–27)
Bilirubin Total: 0.6 mg/dL (ref 0.0–1.2)
CO2: 22 mmol/L (ref 20–29)
Calcium: 8.8 mg/dL (ref 8.6–10.2)
Chloride: 100 mmol/L (ref 96–106)
Creatinine, Ser: 2.11 mg/dL — ABNORMAL HIGH (ref 0.76–1.27)
GFR calc Af Amer: 35 mL/min/{1.73_m2} — ABNORMAL LOW (ref >59)
GFR calc non Af Amer: 30 mL/min/{1.73_m2} — ABNORMAL LOW (ref >59)
Globulin, Total: 2.5 g/dL (ref 1.5–4.5)
Glucose: 159 mg/dL — ABNORMAL HIGH (ref 65–99)
Potassium: 4.7 mmol/L (ref 3.5–5.2)
Sodium: 135 mmol/L (ref 134–144)
Total Protein: 6.4 g/dL (ref 6.0–8.5)

## 2018-08-15 LAB — HEMOGLOBIN A1C
Est. average glucose Bld gHb Est-mCnc: 160 mg/dL
Hgb A1c MFr Bld: 7.2 % — ABNORMAL HIGH (ref 4.8–5.6)

## 2018-08-15 NOTE — Progress Notes (Signed)
Start time: 11:18 End time: 11:50   Virtual Visit via Video Note  I connected with James Glover on 08/16/2018 by a video enabled telemedicine application and verified that I am speaking with the correct person using two identifiers.  Location: Patient: at home, wife is present Provider: office   I discussed the limitations of evaluation and management by telemedicine and the availability of in person appointments. The patient expressed understanding and agreed to proceed. He consents to insurance being filed for this visit.  History of Present Illness:  Patient presents for 6 month med check.  Since his physical 6 months ago, he was diagnosed with large volume, aggressive prostate cancer (biopsied 05/30/2018).  He is under the care of Dr. Tresa Moore, and Dr. Tammi Klippel.  He was started on casodex, and is getting Lupron injection today.  He will be scheduled for seed implant followed by radiation.  (per Dr. Tammi Klippel: LT-ADT in combination with an upfront brachytherapy boost with SpaceOAR gel followed by a 5 week course of daily radiotherapy.)   Diabetes:   At his physical we adjusted his meds so that he was splitting the glipizide (1/2 tablet BID), rather than splitting the onglyza (which he had done in error).   Sugars are running 136-184 (frequently 140's-160's or higher) in the morning; evening 103-160, average 120's-130. He continues to avoid carbs, sweets. He denies hypoglycemia, polydipsia, polyuria (nocturia 4x/night, unchanged), some frequency during the day as well.  Denies numbness, tingling, lesions/sores, and checks his feet regularly. Last diabetic eye exam we have record of was 01/2017; pt states he went 03/2018, unchanged.  Will call for records  Still has right knee pain, s/p knee surgery.  He can only walk 1 mile/day, at least 6 days/week.  Hasn't been able to go to the gym since COVID-19 (closed).  He reported looking forward to getting back to the gym once it  opened.  Hypertension:  BP's are running 130/60's at [employer]. Runs about the same at his other doctors, even at the dentist. He denies headaches, dizziness, chest pain or side effects from medications.  Hyperlipidemia: he is compliant with taking simvastatin, denies side effects. He had labs prior to visit, see below.  CKD, stage 3: under the care of nephrologist, goes yearly.   PMH, PSH, SH reviewed  Outpatient Encounter Medications as of 08/16/2018  Medication Sig Note  . aspirin 81 MG tablet Take 81 mg by mouth daily.     . bicalutamide (CASODEX) 50 MG tablet Take 50 mg by mouth daily.    Marland Kitchen glipiZIDE (GLUCOTROL) 5 MG tablet Take 1/2 tablet by mouth with breakfast, and full tablet with dinner   . hydrochlorothiazide (HYDRODIURIL) 25 MG tablet TAKE 1 TABLET(25 MG) BY MOUTH DAILY   . losartan (COZAAR) 50 MG tablet TAKE 1 TABLET(50 MG) BY MOUTH DAILY   . Omega-3 Fatty Acids (FISH OIL) 1000 MG CAPS Take 2 capsules by mouth daily.   . ONE TOUCH ULTRA TEST test strip USE AS DIRECTED   . ONETOUCH DELICA LANCETS FINE MISC U UTD 04/09/2015: Received from: External Pharmacy  . pioglitazone (ACTOS) 45 MG tablet TAKE 1 TABLET(45 MG) BY MOUTH DAILY   . saxagliptin HCl (ONGLYZA) 2.5 MG TABS tablet TAKE 1 TABLET(2.5 MG) BY MOUTH DAILY   . simvastatin (ZOCOR) 20 MG tablet TAKE 1 TABLET(20 MG) BY MOUTH AT BEDTIME   . [DISCONTINUED] glipiZIDE (GLUCOTROL) 5 MG tablet TAKE 1/2 TABLET(2.5 MG) BY MOUTH TWICE DAILY BEFORE A MEAL   . [DISCONTINUED]  hydrochlorothiazide (HYDRODIURIL) 25 MG tablet TAKE 1 TABLET(25 MG) BY MOUTH DAILY   . [DISCONTINUED] losartan (COZAAR) 50 MG tablet TAKE 1 TABLET(50 MG) BY MOUTH DAILY   . [DISCONTINUED] pioglitazone (ACTOS) 45 MG tablet TAKE 1 TABLET(45 MG) BY MOUTH DAILY   . [DISCONTINUED] saxagliptin HCl (ONGLYZA) 2.5 MG TABS tablet TAKE 1 TABLET(2.5 MG) BY MOUTH DAILY   . [DISCONTINUED] simvastatin (ZOCOR) 20 MG tablet TAKE 1 TABLET(20 MG) BY MOUTH AT BEDTIME   .  fluticasone (FLONASE) 50 MCG/ACT nasal spray Place 1 spray into both nostrils 2 (two) times daily.   Marland Kitchen guaiFENesin (MUCINEX PO) Take 1 tablet by mouth as needed.   . sildenafil (REVATIO) 20 MG tablet Take 2-5 tablets once daily as needed for erectile dysfunction (Patient not taking: Reported on 08/16/2018)   . valACYclovir (VALTREX) 1000 MG tablet Take 2,000 mg by mouth as needed. Reported on 04/09/2015 10/10/2014: From her dermatologist [facility]); prn fever blisters  . [DISCONTINUED] diazepam (VALIUM) 10 MG tablet    . [DISCONTINUED] fluconazole (DIFLUCAN) 200 MG tablet TK 1 T PO Q WEEK ON THE SAME DAY    No facility-administered encounter medications on file as of 08/16/2018.    Allergies  Allergen Reactions  . Ace Inhibitors     cough   ROS: no fever, chills, URI symptoms, cough, shortness of breath, chest pain, headaches, dizziness, syncope. No bleeding, bruising, rashes.  No GI complaints. Nocturia unchanged. No dysuria, hematuria. Moods are good. Persistent pain R knee, s/p surgery. See HPI    Observations/Objective:  BP 140/70   Pulse 64   Temp 97.6 F (36.4 C) (Temporal)   Ht 6' 3.25" (1.911 m)   Wt 268 lb (121.6 kg)   BMI 33.28 kg/m   Wt Readings from Last 3 Encounters:  08/14/18 268 lb (121.6 kg)  07/25/18 258 lb (117 kg)  02/02/18 265 lb 9.6 oz (120.5 kg)   BP Readings from Last 3 Encounters:  08/14/18 140/70  02/02/18 138/70  09/05/17 132/70   Well appearing, pleasant, elderly male in no distress. He is in good spirits. Normal eye contact, speech, grooming. Cranial nerves are grossly intact. He is alert, oriented. Exam is limited due to the virtual nature of the visit.  Lab Results  Component Value Date   HGBA1C 7.2 (H) 08/14/2018   Fasting glucose 159    Chemistry      Component Value Date/Time   NA 135 08/14/2018 0915   K 4.7 08/14/2018 0915   CL 100 08/14/2018 0915   CO2 22 08/14/2018 0915   BUN 31 (H) 08/14/2018 0915   CREATININE 2.11 (H)  08/14/2018 0915   CREATININE 1.73 (H) 02/02/2017 1205      Component Value Date/Time   CALCIUM 8.8 08/14/2018 0915   ALKPHOS 41 08/14/2018 0915   AST 17 08/14/2018 0915   ALT 13 08/14/2018 0915   BILITOT 0.6 08/14/2018 0915     Lab Results  Component Value Date   CHOL 142 08/14/2018   HDL 53 08/14/2018   LDLCALC 79 08/14/2018   TRIG 48 08/14/2018   CHOLHDL 2.7 08/14/2018    Assessment and Plan:  Type 2 diabetes with nephropathy (HCC) - A1c above goal, at 7.2%. Morning sugars are elevated.  Increase glipizide with dinner to full tablet, cont 1/2 tab in morning. Continue Pioglitazone and Onglyza. Diet/exercise counseling given - Plan: glipiZIDE (GLUCOTROL) 5 MG tablet, pioglitazone (ACTOS) 45 MG tablet, saxagliptin HCl (ONGLYZA) 2.5 MG TABS tablet, Hemoglobin A1c, TSH, Microalbumin / creatinine urine  ratio, Comprehensive metabolic panel  Essential hypertension, benign - borderline BP at recent lab visit; normal elsewhere. Cont low Na diet, current meds. Discussed exercise - Plan: hydrochlorothiazide (HYDRODIURIL) 25 MG tablet, losartan (COZAAR) 50 MG tablet  Dyslipidemia - at goal on current regimen, continue - Plan: simvastatin (ZOCOR) 20 MG tablet  Medication monitoring encounter - Plan: Hemoglobin A1c, CBC with Differential/Platelet, TSH, Microalbumin / creatinine urine ratio, Comprehensive metabolic panel  Prostate cancer (HCC) - Under care of Dr. Tresa Moore and Dr. Tammi Klippel; pt understands plans going forward.  Lupron today - Plan: CBC with Differential/Platelet, Comprehensive metabolic panel    Continue 1/2 tablet of glipizide before breakfast, but increase the dose with dinner to the FULL tablet. Continue pioglitazone and Onglyza at the same doses.  Please let us know your sugars in the next 2 weeks, especially if not better or if you're having frequent low sugars (anything <70). Nonweightbearing cardio No gym yet even if opens  Gets records from [facility] from 03/2018  F/u  in 6 months for CPE/med check. Sooner for a 3 month check if needed based on sugars. Fasting labs prior to his CPE--A1c, TSH, c-met, urine microalb, CBC    Follow Up Instructions:  AVS will be mailed to patient, per his request.  I discussed the assessment and treatment plan with the patient. The patient was provided an opportunity to ask questions and all were answered. The patient agreed with the plan and demonstrated an understanding of the instructions.   The patient was advised to call back or seek an in-person evaluation if the symptoms worsen or if the condition fails to improve as anticipated.  I provided 32 minutes of non-face-to-face time during this encounter.   Vikki Ports, MD

## 2018-08-16 ENCOUNTER — Ambulatory Visit: Payer: Medicare Other | Admitting: Family Medicine

## 2018-08-16 ENCOUNTER — Other Ambulatory Visit: Payer: Self-pay

## 2018-08-16 ENCOUNTER — Encounter: Payer: Self-pay | Admitting: Family Medicine

## 2018-08-16 VITALS — BP 140/70 | HR 64 | Temp 97.6°F | Ht 75.25 in | Wt 268.0 lb

## 2018-08-16 DIAGNOSIS — I10 Essential (primary) hypertension: Secondary | ICD-10-CM | POA: Diagnosis not present

## 2018-08-16 DIAGNOSIS — Z5181 Encounter for therapeutic drug level monitoring: Secondary | ICD-10-CM | POA: Diagnosis not present

## 2018-08-16 DIAGNOSIS — E1121 Type 2 diabetes mellitus with diabetic nephropathy: Secondary | ICD-10-CM

## 2018-08-16 DIAGNOSIS — E785 Hyperlipidemia, unspecified: Secondary | ICD-10-CM | POA: Diagnosis not present

## 2018-08-16 DIAGNOSIS — C61 Malignant neoplasm of prostate: Secondary | ICD-10-CM

## 2018-08-16 MED ORDER — HYDROCHLOROTHIAZIDE 25 MG PO TABS: ORAL_TABLET | ORAL | 5 refills | Status: DC

## 2018-08-16 MED ORDER — LOSARTAN POTASSIUM 50 MG PO TABS: ORAL_TABLET | ORAL | 5 refills | Status: DC

## 2018-08-16 MED ORDER — SIMVASTATIN 20 MG PO TABS: ORAL_TABLET | ORAL | 5 refills | Status: DC

## 2018-08-16 MED ORDER — ONGLYZA 2.5 MG PO TABS: ORAL_TABLET | ORAL | 5 refills | Status: DC

## 2018-08-16 MED ORDER — GLIPIZIDE 5 MG PO TABS: ORAL_TABLET | ORAL | 5 refills | Status: DC

## 2018-08-16 MED ORDER — PIOGLITAZONE HCL 45 MG PO TABS: ORAL_TABLET | ORAL | 5 refills | Status: DC

## 2018-08-16 NOTE — Patient Instructions (Signed)
Continue 1/2 tablet of glipizide before breakfast, but increase the dose with dinner to the FULL tablet. Be sure that you do not skip dinner--if you take the medication and do not eat a good meal, you are at risk for having your sugars drop too low.  You might want to keep a few pieces of candy at your bedside in case you wake up with hypoglycemic symptoms (sweats, shaky).  If you are having low sugars at night, we will need to adjust the medication. Continue pioglitazone and Onglyza at the same doses. Continue to check the sugars twice daily and keep notes as you have been doing. Please let us know your sugars in the next 2 weeks, especially if not better or if you're having frequent low sugars (anything <70).  I don't want you rushing to the gym when they open. You are high risk, and I'd prefer you to continue to use a lot of caution when out in public, especially in places where people won't be using masks.  We discussed some nonweightbearing cardio (marching in place while seated), which can give you good cardio without causing more knee pain.  You can continue to walk daily as tolerated.  Please call us for a sooner appointment, based on your blood sugars, especially if they continue to run high or if you're having frequent low sugars, rather than waiting until December.

## 2018-08-16 NOTE — Progress Notes (Signed)
Pt has appt on dec 9th at 1.30 he would like to come in on Monday dec the 7th at 9.30 to fasting labs please, if you can put the orders in thanks

## 2018-08-22 ENCOUNTER — Telehealth: Payer: Self-pay | Admitting: *Deleted

## 2018-08-22 NOTE — Telephone Encounter (Signed)
Called patient to inform of pre-seed appts. and implant, lvm for a return call 

## 2018-08-23 ENCOUNTER — Other Ambulatory Visit: Payer: Self-pay | Admitting: Urology

## 2018-08-28 ENCOUNTER — Telehealth: Payer: Self-pay | Admitting: *Deleted

## 2018-08-28 NOTE — Telephone Encounter (Signed)
CALLED PATIENT TO INFORM OF PRE-SEED APPTS. ON 10-05-18 AND HIS IMPLANT ON 11-03-18, SPOKE WITH PATIENT AND HE IS AWARE OF THESE APPTS.

## 2018-09-06 ENCOUNTER — Telehealth: Payer: Self-pay | Admitting: Family Medicine

## 2018-09-06 NOTE — Telephone Encounter (Signed)
Requested records received from [facility]

## 2018-09-07 ENCOUNTER — Encounter: Payer: Self-pay | Admitting: *Deleted

## 2018-09-25 ENCOUNTER — Telehealth: Payer: Self-pay | Admitting: Family Medicine

## 2018-09-25 DIAGNOSIS — E1121 Type 2 diabetes mellitus with diabetic nephropathy: Secondary | ICD-10-CM

## 2018-09-25 MED ORDER — GLIPIZIDE 5 MG PO TABS: 5.0000 mg | ORAL_TABLET | Freq: Two times a day (BID) | ORAL | 1 refills | Status: DC

## 2018-09-25 NOTE — Telephone Encounter (Signed)
It is ONE pill twice daily (or two pills/d), not 2 pills twice daily as stated in message. I sent in a new prescription with quantity of 60 instead of 45.

## 2018-09-25 NOTE — Telephone Encounter (Signed)
Patient was informed of the changes. Pt has stated he will need a refill on glipizide and need the instructions to be changed to 2 pills twice daily per provider. Please advise.

## 2018-09-25 NOTE — Telephone Encounter (Signed)
Please call pt and advise of below  Pt's list of blood sugars received, ranging from 6/18 through 7/17.  At his last visit we had him increase his glipizide to full tablet, keeping morning dose at 1/2 tablet (in addition to his pioglitazone and onglyza.  Review of his sugars show they remain elevated (ranging 133-169 in the mornings, mostly 150's; 86 (once before dinner when he didn't have lunch)-149 in the pm's.  Advise patient that I really don't see any difference or improvement. Have him increase the glipizide to a full tablet twice daily, taken before/with meals. He needs to be sure NOT to skip lunch--he will be at risk for low blood sugars before dinner if he isn't careful. If sugars don't come down within 2 weeks of the change (and/or if he is having sugars below 70 in the afternoons), then he needs to schedule a virtual f/u visit to discuss other medication options to add.

## 2018-09-26 ENCOUNTER — Other Ambulatory Visit: Payer: Self-pay | Admitting: Urology

## 2018-09-26 DIAGNOSIS — C61 Malignant neoplasm of prostate: Secondary | ICD-10-CM

## 2018-09-28 ENCOUNTER — Telehealth: Payer: Self-pay | Admitting: Family Medicine

## 2018-09-28 MED ORDER — GLUCOSE BLOOD VI STRP: ORAL_STRIP | 3 refills | Status: DC

## 2018-09-28 NOTE — Telephone Encounter (Signed)
Pt left message needs refill One Touch Ultra Blue test strips

## 2018-09-28 NOTE — Telephone Encounter (Signed)
Done

## 2018-10-02 ENCOUNTER — Telehealth: Payer: Self-pay | Admitting: Family Medicine

## 2018-10-02 NOTE — Telephone Encounter (Signed)
Pts wife called and said pt needs refill on one touch ultra test stripes sent to the [facility] in [place]

## 2018-10-02 NOTE — Telephone Encounter (Signed)
Done

## 2018-10-04 ENCOUNTER — Telehealth: Payer: Self-pay | Admitting: *Deleted

## 2018-10-04 NOTE — Progress Notes (Signed)
  Radiation Oncology         (336) [phone] ________________________________  Name: James Glover MRN: [id number]  Date: 10/05/2018  DOB: [birth year]  SIMULATION AND TREATMENT PLANNING NOTE PUBIC ARCH STUDY  [id number], Tera Helper, MD  [name], MD  DIAGNOSIS: 73 y.o. gentleman with Stage T1c adenocarcinoma of the prostate with Gleason score of 4+5 and PSA of 4.77     ICD-10-CM   1. Prostate cancer (Early)  C61     COMPLEX SIMULATION:  The patient presented today for evaluation for possible prostate seed implant. He was brought to the radiation planning suite and placed supine on the CT couch. A 3-dimensional image study set was obtained in upload to the planning computer. There, on each axial slice, I contoured the prostate gland. Then, using three-dimensional radiation planning tools I reconstructed the prostate in view of the structures from the transperineal needle pathway to assess for possible pubic arch interference. In doing so, I did not appreciate any pubic arch interference. Also, the patient's prostate volume was estimated based on the drawn structure. The volume was 30 cc.  Given the pubic arch appearance and prostate volume, patient remains a good candidate to proceed with prostate seed implant. Today, he freely provided informed written consent to proceed.    PLAN: The patient will undergo prostate seed implant.   ________________________________  Sheral Apley. Tammi Klippel, M.D.

## 2018-10-04 NOTE — Telephone Encounter (Signed)
CALLED PATIENT TO REMIND OF PRE-SEED APPTS. FOR 10-05-18  LVM FOR A RETURN CALL

## 2018-10-05 ENCOUNTER — Ambulatory Visit
Admission: RE | Admit: 2018-10-05 | Discharge: 2018-10-05 | Disposition: A | Discharge status: Home / Self Care | Payer: Medicare Other | Source: Ambulatory Visit | Attending: Radiation Oncology | Admitting: Radiation Oncology

## 2018-10-05 ENCOUNTER — Encounter (HOSPITAL_COMMUNITY)
Admission: RE | Admit: 2018-10-05 | Discharge: 2018-10-05 | Disposition: A | Discharge status: Home / Self Care | Payer: Medicare Other | Source: Ambulatory Visit | Attending: Urology | Admitting: Urology

## 2018-10-05 ENCOUNTER — Ambulatory Visit
Admission: RE | Admit: 2018-10-05 | Discharge: 2018-10-05 | Disposition: A | Discharge status: Home / Self Care | Payer: Medicare FFS | Source: Ambulatory Visit | Attending: Urology | Admitting: Urology

## 2018-10-05 ENCOUNTER — Other Ambulatory Visit: Payer: Self-pay

## 2018-10-05 ENCOUNTER — Ambulatory Visit (HOSPITAL_COMMUNITY)
Admission: RE | Admit: 2018-10-05 | Discharge: 2018-10-05 | Disposition: A | Discharge status: Home / Self Care | Payer: Medicare Other | Source: Ambulatory Visit | Attending: Urology | Admitting: Urology

## 2018-10-05 DIAGNOSIS — C61 Malignant neoplasm of prostate: Secondary | ICD-10-CM | POA: Diagnosis not present

## 2018-10-05 DIAGNOSIS — Z01818 Encounter for other preprocedural examination: Secondary | ICD-10-CM | POA: Insufficient documentation

## 2018-10-16 ENCOUNTER — Other Ambulatory Visit: Payer: Self-pay | Admitting: Family Medicine

## 2018-10-16 ENCOUNTER — Other Ambulatory Visit: Payer: Self-pay | Admitting: *Deleted

## 2018-10-16 DIAGNOSIS — I10 Essential (primary) hypertension: Secondary | ICD-10-CM

## 2018-10-16 DIAGNOSIS — E1121 Type 2 diabetes mellitus with diabetic nephropathy: Secondary | ICD-10-CM

## 2018-10-16 MED ORDER — GLIPIZIDE 5 MG PO TABS: 5.0000 mg | ORAL_TABLET | Freq: Two times a day (BID) | ORAL | 0 refills | Status: DC

## 2018-10-31 ENCOUNTER — Encounter (HOSPITAL_COMMUNITY)
Admission: RE | Admit: 2018-10-31 | Discharge: 2018-10-31 | Disposition: A | Discharge status: Home / Self Care | Payer: Medicare Other | Source: Ambulatory Visit

## 2018-10-31 ENCOUNTER — Other Ambulatory Visit: Payer: Self-pay

## 2018-10-31 ENCOUNTER — Other Ambulatory Visit (HOSPITAL_COMMUNITY)
Admission: RE | Admit: 2018-10-31 | Discharge: 2018-10-31 | Disposition: A | Discharge status: Home / Self Care | Payer: Medicare Other | Source: Ambulatory Visit | Attending: Anesthesiology | Admitting: Anesthesiology

## 2018-10-31 DIAGNOSIS — Z20828 Contact with and (suspected) exposure to other viral communicable diseases: Secondary | ICD-10-CM | POA: Insufficient documentation

## 2018-10-31 DIAGNOSIS — Z01812 Encounter for preprocedural laboratory examination: Secondary | ICD-10-CM | POA: Diagnosis present

## 2018-10-31 LAB — CBC
HCT: 38.8 % — ABNORMAL LOW (ref 39.0–52.0)
Hemoglobin: 12.5 g/dL — ABNORMAL LOW (ref 13.0–17.0)
MCH: 31.6 pg (ref 26.0–34.0)
MCHC: 32.2 g/dL (ref 30.0–36.0)
MCV: 98 fL (ref 80.0–100.0)
Platelets: 149 10*3/uL — ABNORMAL LOW (ref 150–400)
RBC: 3.96 MIL/uL — ABNORMAL LOW (ref 4.22–5.81)
RDW: 13.9 % (ref 11.5–15.5)
WBC: 5.7 10*3/uL (ref 4.0–10.5)
nRBC: 0 % (ref 0.0–0.2)

## 2018-10-31 LAB — COMPREHENSIVE METABOLIC PANEL
ALT: 23 U/L (ref 0–44)
AST: 20 U/L (ref 15–41)
Albumin: 3.8 g/dL (ref 3.5–5.0)
Alkaline Phosphatase: 41 U/L (ref 38–126)
Anion gap: 9 (ref 5–15)
BUN: 36 mg/dL — ABNORMAL HIGH (ref 8–23)
CO2: 26 mmol/L (ref 22–32)
Calcium: 9.1 mg/dL (ref 8.9–10.3)
Chloride: 99 mmol/L (ref 98–111)
Creatinine, Ser: 1.84 mg/dL — ABNORMAL HIGH (ref 0.61–1.24)
GFR calc Af Amer: 42 mL/min — ABNORMAL LOW (ref >60)
GFR calc non Af Amer: 36 mL/min — ABNORMAL LOW (ref >60)
Glucose, Bld: 211 mg/dL — ABNORMAL HIGH (ref 70–99)
Potassium: 4.6 mmol/L (ref 3.5–5.1)
Sodium: 134 mmol/L — ABNORMAL LOW (ref 135–145)
Total Bilirubin: 0.7 mg/dL (ref 0.3–1.2)
Total Protein: 7.6 g/dL (ref 6.5–8.1)

## 2018-10-31 LAB — PROTIME-INR
INR: 1 (ref 0.8–1.2)
Prothrombin Time: 12.9 seconds (ref 11.4–15.2)

## 2018-10-31 LAB — APTT: aPTT: 31 seconds (ref 24–36)

## 2018-10-31 LAB — SARS CORONAVIRUS 2 (TAT 6-24 HRS): SARS Coronavirus 2: NEGATIVE

## 2018-11-01 ENCOUNTER — Encounter (HOSPITAL_BASED_OUTPATIENT_CLINIC_OR_DEPARTMENT_OTHER): Payer: Self-pay | Admitting: *Deleted

## 2018-11-01 ENCOUNTER — Other Ambulatory Visit: Payer: Self-pay

## 2018-11-01 NOTE — Progress Notes (Signed)
Spoke w/ pt via phone for pre-op interview.  Npo after mn.  Arrive at 0730.  Current lab and covid test results and in chart and epic, dated 10-31-2018.  Will take prilosec am dos w/ sips of water.  Pt verbalized understanding to do one fleet am dos.  Pt denies any cardiac s&s, sob, difficulty breathing or peripheral swelling.  Pt nephrologist lov note date 03-08-2018 w/ chart.  Per pt fasting blood sugar 136-184).

## 2018-11-02 ENCOUNTER — Telehealth: Payer: Self-pay | Admitting: *Deleted

## 2018-11-02 NOTE — Telephone Encounter (Signed)
Called patient to remind of procedure for 11-03-18, spoke with patient and he is aware of this procedure

## 2018-11-03 ENCOUNTER — Ambulatory Visit (HOSPITAL_COMMUNITY): Payer: Medicare Other

## 2018-11-03 ENCOUNTER — Encounter (HOSPITAL_BASED_OUTPATIENT_CLINIC_OR_DEPARTMENT_OTHER)
Admission: RE | Disposition: A | Discharge status: Home / Self Care | Payer: Self-pay | Source: Home / Self Care | Attending: Urology

## 2018-11-03 ENCOUNTER — Ambulatory Visit (HOSPITAL_BASED_OUTPATIENT_CLINIC_OR_DEPARTMENT_OTHER)
Admission: RE | Admit: 2018-11-03 | Discharge: 2018-11-03 | Disposition: A | Discharge status: Home / Self Care | Payer: Medicare Other | Attending: Urology | Admitting: Urology

## 2018-11-03 ENCOUNTER — Ambulatory Visit (HOSPITAL_BASED_OUTPATIENT_CLINIC_OR_DEPARTMENT_OTHER): Payer: Medicare Other | Admitting: Certified Registered"

## 2018-11-03 ENCOUNTER — Encounter (HOSPITAL_BASED_OUTPATIENT_CLINIC_OR_DEPARTMENT_OTHER): Payer: Self-pay | Admitting: Certified Registered"

## 2018-11-03 ENCOUNTER — Other Ambulatory Visit: Payer: Self-pay

## 2018-11-03 DIAGNOSIS — E669 Obesity, unspecified: Secondary | ICD-10-CM | POA: Diagnosis not present

## 2018-11-03 DIAGNOSIS — C61 Malignant neoplasm of prostate: Secondary | ICD-10-CM | POA: Insufficient documentation

## 2018-11-03 DIAGNOSIS — Z6833 Body mass index (BMI) 33.0-33.9, adult: Secondary | ICD-10-CM | POA: Diagnosis not present

## 2018-11-03 DIAGNOSIS — Z7982 Long term (current) use of aspirin: Secondary | ICD-10-CM | POA: Diagnosis not present

## 2018-11-03 DIAGNOSIS — E1122 Type 2 diabetes mellitus with diabetic chronic kidney disease: Secondary | ICD-10-CM | POA: Insufficient documentation

## 2018-11-03 DIAGNOSIS — Z01818 Encounter for other preprocedural examination: Secondary | ICD-10-CM

## 2018-11-03 DIAGNOSIS — N183 Chronic kidney disease, stage 3 (moderate): Secondary | ICD-10-CM | POA: Diagnosis not present

## 2018-11-03 DIAGNOSIS — I129 Hypertensive chronic kidney disease with stage 1 through stage 4 chronic kidney disease, or unspecified chronic kidney disease: Secondary | ICD-10-CM | POA: Diagnosis not present

## 2018-11-03 DIAGNOSIS — N401 Enlarged prostate with lower urinary tract symptoms: Secondary | ICD-10-CM | POA: Diagnosis not present

## 2018-11-03 DIAGNOSIS — Z79899 Other long term (current) drug therapy: Secondary | ICD-10-CM | POA: Diagnosis not present

## 2018-11-03 DIAGNOSIS — E785 Hyperlipidemia, unspecified: Secondary | ICD-10-CM | POA: Diagnosis not present

## 2018-11-03 DIAGNOSIS — K219 Gastro-esophageal reflux disease without esophagitis: Secondary | ICD-10-CM | POA: Insufficient documentation

## 2018-11-03 DIAGNOSIS — Z7984 Long term (current) use of oral hypoglycemic drugs: Secondary | ICD-10-CM | POA: Insufficient documentation

## 2018-11-03 HISTORY — DX: Cyst of kidney, acquired: N28.1

## 2018-11-03 HISTORY — DX: Type 2 diabetes mellitus without complications: E11.9

## 2018-11-03 HISTORY — PX: CYSTOSCOPY: SHX5120

## 2018-11-03 HISTORY — DX: Iron deficiency: E61.1

## 2018-11-03 HISTORY — DX: Hyperlipidemia, unspecified: E78.5

## 2018-11-03 HISTORY — DX: Atrioventricular block, first degree: I44.0

## 2018-11-03 HISTORY — PX: SPACE OAR INSTILLATION: SHX6769

## 2018-11-03 HISTORY — DX: Neoplasm of uncertain behavior of right kidney: D41.01

## 2018-11-03 HISTORY — DX: Benign prostatic hyperplasia with lower urinary tract symptoms: N40.1

## 2018-11-03 HISTORY — PX: RADIOACTIVE SEED IMPLANT: SHX5150

## 2018-11-03 HISTORY — DX: Chronic kidney disease, stage 3 unspecified: N18.30

## 2018-11-03 HISTORY — DX: Personal history of other medical treatment: Z92.89

## 2018-11-03 HISTORY — DX: Presence of dental prosthetic device (complete) (partial): Z97.2

## 2018-11-03 LAB — GLUCOSE, CAPILLARY
Glucose-Capillary: 236 mg/dL — ABNORMAL HIGH (ref 70–99)
Glucose-Capillary: 223 mg/dL — ABNORMAL HIGH (ref 70–99)

## 2018-11-03 SURGERY — INSERTION, RADIATION SOURCE, PROSTATE
Anesthesia: General | Site: Rectum

## 2018-11-03 MED ORDER — FLEET ENEMA 7-19 GM/118ML RE ENEM
1.0000 | ENEMA | Freq: Once | RECTAL | Status: AC
  Administered 2018-11-03: 1 via RECTAL
  Filled 2018-11-03: qty 1

## 2018-11-03 MED ORDER — TRAMADOL HCL 50 MG PO TABS: 50.0000 mg | ORAL_TABLET | Freq: Four times a day (QID) | ORAL | 0 refills | Status: DC | PRN

## 2018-11-03 MED ORDER — TAMSULOSIN HCL 0.4 MG PO CAPS: 0.4000 mg | ORAL_CAPSULE | Freq: Every day | ORAL | 11 refills | Status: DC | PRN

## 2018-11-03 MED ORDER — LIDOCAINE 2% (20 MG/ML) 5 ML SYRINGE
INTRAMUSCULAR | Status: AC
  Filled 2018-11-03: qty 5

## 2018-11-03 MED ORDER — ONDANSETRON HCL 4 MG/2ML IJ SOLN
4.0000 mg | Freq: Once | INTRAMUSCULAR | Status: DC | PRN
  Filled 2018-11-03: qty 2

## 2018-11-03 MED ORDER — SODIUM CHLORIDE 0.9 % IV SOLN
INTRAVENOUS | Status: DC
  Administered 2018-11-03: 08:00:00 via INTRAVENOUS
  Filled 2018-11-03: qty 1000

## 2018-11-03 MED ORDER — SODIUM CHLORIDE FLUSH 0.9 % IV SOLN
INTRAVENOUS | Status: DC | PRN
  Administered 2018-11-03: 10 mL via INTRAVENOUS

## 2018-11-03 MED ORDER — FENTANYL CITRATE (PF) 100 MCG/2ML IJ SOLN
INTRAMUSCULAR | Status: AC
  Filled 2018-11-03: qty 2

## 2018-11-03 MED ORDER — CIPROFLOXACIN IN D5W 400 MG/200ML IV SOLN
400.0000 mg | INTRAVENOUS | Status: AC
  Administered 2018-11-03: 400 mg via INTRAVENOUS
  Filled 2018-11-03: qty 200

## 2018-11-03 MED ORDER — ONDANSETRON HCL 4 MG/2ML IJ SOLN
INTRAMUSCULAR | Status: AC
  Filled 2018-11-03: qty 2

## 2018-11-03 MED ORDER — SODIUM CHLORIDE 0.9 % IR SOLN
Status: DC | PRN
  Administered 2018-11-03: 3000 mL

## 2018-11-03 MED ORDER — MEPERIDINE HCL 25 MG/ML IJ SOLN
6.2500 mg | INTRAMUSCULAR | Status: DC | PRN
  Filled 2018-11-03: qty 1

## 2018-11-03 MED ORDER — ROCURONIUM BROMIDE 10 MG/ML (PF) SYRINGE
PREFILLED_SYRINGE | INTRAVENOUS | Status: AC
  Filled 2018-11-03: qty 10

## 2018-11-03 MED ORDER — PROPOFOL 10 MG/ML IV BOLUS
INTRAVENOUS | Status: DC | PRN
  Administered 2018-11-03: 200 mg via INTRAVENOUS
  Administered 2018-11-03 (×2): 30 mg via INTRAVENOUS
  Administered 2018-11-03: 100 mg via INTRAVENOUS

## 2018-11-03 MED ORDER — HYDROMORPHONE HCL 1 MG/ML IJ SOLN
0.2500 mg | INTRAMUSCULAR | Status: DC | PRN
  Filled 2018-11-03: qty 0.5

## 2018-11-03 MED ORDER — FENTANYL CITRATE (PF) 250 MCG/5ML IJ SOLN
INTRAMUSCULAR | Status: DC | PRN
  Administered 2018-11-03 (×2): 25 ug via INTRAVENOUS
  Administered 2018-11-03: 50 ug via INTRAVENOUS

## 2018-11-03 MED ORDER — DEXAMETHASONE SODIUM PHOSPHATE 10 MG/ML IJ SOLN
INTRAMUSCULAR | Status: AC
  Filled 2018-11-03: qty 1

## 2018-11-03 MED ORDER — DEXAMETHASONE SODIUM PHOSPHATE 10 MG/ML IJ SOLN
INTRAMUSCULAR | Status: DC | PRN
  Administered 2018-11-03: 5 mg via INTRAVENOUS

## 2018-11-03 MED ORDER — CIPROFLOXACIN IN D5W 400 MG/200ML IV SOLN
INTRAVENOUS | Status: AC
  Filled 2018-11-03: qty 200

## 2018-11-03 MED ORDER — IOHEXOL 300 MG/ML  SOLN
INTRAMUSCULAR | Status: DC | PRN
  Administered 2018-11-03: 7 mL

## 2018-11-03 MED ORDER — LIDOCAINE 2% (20 MG/ML) 5 ML SYRINGE
INTRAMUSCULAR | Status: DC | PRN
  Administered 2018-11-03: 100 mg via INTRAVENOUS

## 2018-11-03 MED ORDER — ONDANSETRON HCL 4 MG/2ML IJ SOLN
INTRAMUSCULAR | Status: DC | PRN
  Administered 2018-11-03: 4 mg via INTRAVENOUS

## 2018-11-03 MED ORDER — PROPOFOL 10 MG/ML IV BOLUS
INTRAVENOUS | Status: AC
  Filled 2018-11-03: qty 40

## 2018-11-03 SURGICAL SUPPLY — 41 items
BAG URINE DRAINAGE (UROLOGICAL SUPPLIES) ×4 IMPLANT
BLADE CLIPPER SENSICLIP SURGIC (BLADE) ×4 IMPLANT
CATH FOLEY 2WAY SLVR  5CC 16FR (CATHETERS) ×1
CATH FOLEY 2WAY SLVR 5CC 16FR (CATHETERS) ×3 IMPLANT
CATH ROBINSON RED A/P 16FR (CATHETERS) IMPLANT
CATH ROBINSON RED A/P 20FR (CATHETERS) ×4 IMPLANT
CLOTH BEACON ORANGE TIMEOUT ST (SAFETY) ×4 IMPLANT
CONT SPECI 4OZ STER CLIK (MISCELLANEOUS) ×8 IMPLANT
COVER BACK TABLE 60X90IN (DRAPES) ×4 IMPLANT
COVER MAYO STAND STRL (DRAPES) ×4 IMPLANT
COVER WAND RF STERILE (DRAPES) ×4 IMPLANT
DRSG TEGADERM 4X4.75 (GAUZE/BANDAGES/DRESSINGS) ×7 IMPLANT
DRSG TEGADERM 8X12 (GAUZE/BANDAGES/DRESSINGS) ×8 IMPLANT
GAUZE SPONGE 4X4 12PLY STRL (GAUZE/BANDAGES/DRESSINGS) ×1 IMPLANT
GLOVE BIO SURGEON STRL SZ 6 (GLOVE) IMPLANT
GLOVE BIO SURGEON STRL SZ7 (GLOVE) IMPLANT
GLOVE BIO SURGEON STRL SZ7.5 (GLOVE) ×4 IMPLANT
GLOVE BIO SURGEON STRL SZ8 (GLOVE) IMPLANT
GLOVE BIOGEL PI IND STRL 7.5 (GLOVE) IMPLANT
GLOVE BIOGEL PI IND STRL 8.5 (GLOVE) IMPLANT
GLOVE BIOGEL PI INDICATOR 7.5 (GLOVE) ×1
GLOVE BIOGEL PI INDICATOR 8.5 (GLOVE) ×1
GLOVE INDICATOR 8.5 STRL (GLOVE) ×1 IMPLANT
GLOVE SURG ORTHO 8.5 STRL (GLOVE) ×4 IMPLANT
GLOVE SURG SS PI 6.5 STRL IVOR (GLOVE) IMPLANT
GOWN STRL REUS W/TWL LRG LVL3 (GOWN DISPOSABLE) ×4 IMPLANT
GOWN STRL REUS W/TWL XL LVL3 (GOWN DISPOSABLE) ×4 IMPLANT
HOLDER FOLEY CATH W/STRAP (MISCELLANEOUS) IMPLANT
IMPL SPACEOAR SYSTEM 10ML (Spacer) ×3 IMPLANT
IMPLANT SPACEOAR SYSTEM 10ML (Spacer) ×4 IMPLANT
ISEED AGX100 ×59 IMPLANT
IV SOD CHL 0.9% 1000ML (IV SOLUTION) ×4 IMPLANT
KIT TURNOVER CYSTO (KITS) ×4 IMPLANT
MARKER SKIN DUAL TIP RULER LAB (MISCELLANEOUS) ×4 IMPLANT
PACK CYSTO (CUSTOM PROCEDURE TRAY) ×4 IMPLANT
SURGILUBE 2OZ TUBE FLIPTOP (MISCELLANEOUS) ×4 IMPLANT
SUT BONE WAX W31G (SUTURE) IMPLANT
SYR 10ML LL (SYRINGE) ×4 IMPLANT
TOWEL OR 17X26 10 PK STRL BLUE (TOWEL DISPOSABLE) ×4 IMPLANT
UNDERPAD 30X30 (UNDERPADS AND DIAPERS) ×8 IMPLANT
WATER STERILE IRR 500ML POUR (IV SOLUTION) ×4 IMPLANT

## 2018-11-03 NOTE — Anesthesia Procedure Notes (Signed)
Procedure Name: LMA Insertion Date/Time: 11/03/2018 9:52 AM Performed by: Myna Bright, CRNA Pre-anesthesia Checklist: Patient identified, Emergency Drugs available, Suction available and Patient being monitored Patient Re-evaluated:Patient Re-evaluated prior to induction Oxygen Delivery Method: Circle system utilized Preoxygenation: Pre-oxygenation with 100% oxygen Induction Type: IV induction Ventilation: Mask ventilation without difficulty LMA: LMA inserted LMA Size: 5.0 Tube type: Oral Number of attempts: 1 Placement Confirmation: positive ETCO2 and breath sounds checked- equal and bilateral Tube secured with: Tape Dental Injury: Teeth and Oropharynx as per pre-operative assessment

## 2018-11-03 NOTE — Anesthesia Postprocedure Evaluation (Signed)
Anesthesia Post Note  Patient: ZAYLEN HIGHLAND  Procedure(s) Performed: RADIOACTIVE SEED IMPLANT/BRACHYTHERAPY IMPLANT (N/A ) SPACE OAR INSTILLATION (N/A Rectum) CYSTOSCOPY FLEXIBLE (N/A Bladder)     Patient location during evaluation: PACU Anesthesia Type: General Level of consciousness: awake and alert Pain management: pain level controlled Vital Signs Assessment: post-procedure vital signs reviewed and stable Respiratory status: spontaneous breathing, nonlabored ventilation, respiratory function stable and patient connected to nasal cannula oxygen Cardiovascular status: blood pressure returned to baseline and stable Postop Assessment: no apparent nausea or vomiting Anesthetic complications: no    Last Vitals:  Vitals:   11/03/18 1145 11/03/18 1227  BP: (!) 174/65 (!) 165/65  Pulse: 62 60  Resp: 15 13  Temp:  36.7 C  SpO2: 98% 100%    Last Pain:  Vitals:   11/03/18 1215  TempSrc:   PainSc: 0-No pain                 Pantelis Elgersma DAVID

## 2018-11-03 NOTE — Brief Op Note (Signed)
11/03/2018  11:07 AM  PATIENT:  James Glover  73 y.o. male  PRE-OPERATIVE DIAGNOSIS:  PROSTATE CANCER  POST-OPERATIVE DIAGNOSIS:  PROSTATE CANCER  PROCEDURE:  Procedure(s) with comments: RADIOACTIVE SEED IMPLANT/BRACHYTHERAPY IMPLANT (N/A) -      SEEDS IMPLANTED SPACE OAR INSTILLATION (N/A) CYSTOSCOPY FLEXIBLE (N/A) - NO SEEDS FOUND IN BLADDER  SURGEON:  Surgeon(s) and Role:    * Alexis Frock, MD - Primary    * Tyler Pita, MD  PHYSICIAN ASSISTANT:   ASSISTANTS: none   ANESTHESIA:   general  EBL:  minimal   BLOOD ADMINISTERED:none  DRAINS: none   LOCAL MEDICATIONS USED:  NONE  SPECIMEN:  No Specimen  DISPOSITION OF SPECIMEN:  N/A  COUNTS:  YES  TOURNIQUET:  * No tourniquets in log *  DICTATION: .Other Dictation: Dictation Number [id number]  PLAN OF CARE: Discharge to home after PACU  PATIENT DISPOSITION:  PACU - hemodynamically stable.   Delay start of Pharmacological VTE agent (>24hrs) due to surgical blood loss or risk of bleeding: not applicable

## 2018-11-03 NOTE — Op Note (Signed)
NAME: James Glover, James Glover MEDICAL RECORD [id number] ACCOUNT [id number] DATE OF BIRTH:[birth year] FACILITY: [facility] LOCATION: [facility] PHYSICIAN:Gurkirat Basher Tresa Moore, MD  OPERATIVE REPORT  DATE OF PROCEDURE:  11/03/2018  PREOPERATIVE DIAGNOSIS:  High risk prostate cancer.  PROCEDURE: 1.  Brachytherapy seed implantation. 2.  Cystoscopy. 3.  SpaceOAR instillation.  ESTIMATED BLOOD LOSS:  Nil.  COMPLICATIONS:  None.  SPECIMENS:  None.  FINDINGS: 1.  No evidence of intraluminal seed placement after brachytherapy. 2.  Successful placement of 59 brachytherapy seeds across 29 catheters as per radiation plan. 3.  Successful placement of SpaceOAR gel matrix in prerectal space.  INDICATIONS:  The patient is a pleasant 73 year old man who was found on workup of elevated PSA to have multifocal high-risk adenocarcinoma of the prostate.  Options were discussed for management including surveillance protocols versus ablative therapy  versus surgical extirpation, and he wished to proceed with curative intent path with multimodal radiation and hormones.  He received his first [facility] agonist shot several months ago.  He has had a good response to this.  He now presents for first stage of  radiation with brachytherapy seed implantation.  Informed consent was obtained and placed in the medical record.  PROCEDURE IN DETAIL:  The patient being identified and procedure being prostate brachytherapy seed implantation, cystoscopy, and SpaceOAR was confirmed.  Procedure timeout was performed.  Intravenous antibiotics administered.  General anesthesia induced.   The patient was placed into a medium-lithotomy position.  A sterile field was created, prepped and draped his penis, perineum and proximal thighs using iodine.  A Foley catheter was placed free to straight drain with contrast in the balloon.   Transrectal ultrasound probe was introduced and prostate volumetric analysis was performed and radiation planning  performed as per separate radiation oncology note.  Next as per radiation plan, 70 seeds for prescribed dose of 110 Gy were delivered across  21 catheters in transperineal approach, ultrasound-guided.  Attention was directed at [facility] placement.  After taking the ultrasound off of anterior tension, the introducer needle was placed in a posterior peritoneal location and guided under  ultrasound vision into the prerectal space, verified by the fat stripe.  A test injection of 2 mL of saline was placed, which corroborated excellent placement of the needle.  Next, 10 mL of the SpaceOAR gel matrix was placed across 10 seconds mid gland  in the prerectal space.  This resulted in excellent posterior displacement of the anterior rectal wall away from the prostate as per plan.  Foley catheter was then removed and cystourethroscopy was performed using a 16-French flexible cystoscope.   Inspection of the anterior and posterior urethra was unremarkable.  Inspection of the bladder with no diverticula, calcifications, papillary lesions, or any evidence of intraluminal radiation seeds.  Retroflexion showed no additional findings.  The  bladder was left partially filled, and the procedure was terminated.  The patient tolerated the procedure well.  No immediate complications.  The patient was taken to postanesthesia care unit in stable condition with plan for discharge home after void.  LN/NUANCE  D:11/03/2018 T:11/03/2018 JOB:[id number]

## 2018-11-03 NOTE — Transfer of Care (Signed)
Immediate Anesthesia Transfer of Care Note  Patient: James Glover  Procedure(s) Performed: RADIOACTIVE SEED IMPLANT/BRACHYTHERAPY IMPLANT (N/A ) SPACE OAR INSTILLATION (N/A Rectum) CYSTOSCOPY FLEXIBLE (N/A Bladder)  Patient Location: PACU  Anesthesia Type:General  Level of Consciousness: awake, alert , oriented and patient cooperative  Airway & Oxygen Therapy: Patient Spontanous Breathing and Patient connected to nasal cannula oxygen  Post-op Assessment: Report given to RN, Post -op Vital signs reviewed and stable and Patient moving all extremities  Post vital signs: Reviewed and stable  Last Vitals:  Vitals Value Taken Time  BP 158/72 11/03/18 1106  Temp    Pulse 70 11/03/18 1109  Resp 13 11/03/18 1109  SpO2 100 % 11/03/18 1109  Vitals shown include unvalidated device data.  Last Pain:  Vitals:   11/03/18 0757  TempSrc:   PainSc: 0-No pain      Patients Stated Pain Goal: 8 ([id number])  Complications: No apparent anesthesia complications

## 2018-11-03 NOTE — H&P (Signed)
James Glover is an 73 y.o. male.    Chief Complaint: Pre-OP Prostate Brachytherapy seen implantation  HPI:   1 - High Risk Prostate Cancer - 6/12 cores up to 95% Grade 5 cancer on eval rising PSA to 5.4 and abnormal exam 05/2018. All Rt cores positive. TRUS 31 mL / Rt hypoechoic, NO median lobe. CT and Bone scan clinically localized. After consideration of options, he has elected to have 2 years androgen deprivation + brachy + XRT in coordination with [facility] radiation oncology.   Recent Course:  07/2018 - Lupron 45; 10/2018 - PSA 0.105 / T <10   2 - Stage 3 Chronic Kidney Disease - Cr 1.7-2 x years. He has HTN and DM2. CT 2020 no hydro   PMH sig for obesity, DM2 (A1c now 6-7), HTN, HLD, knee arthroscopy. He is retired [employer]. His PCP is [name] MD.   Today " James Glover " is seen to proceed with prostate brachytherapy seed implantation. No interval fevers.      Past Medical History:  Diagnosis Date  . CKD (chronic kidney disease), stage III [facility])    nephrologist-- [place]--- per lov note 03-08-2018 in epic, stable (baseline Cr 1.5 - 2)  . Dyslipidemia   . Erectile dysfunction   . First degree heart block   . GERD (gastroesophageal reflux disease)   . Hemorrhoids    internal and external  . History of nuclear stress test    05-26-2009 (by dr Rollene Fare due to HTN and DM2)--- normal study w/ no ischemia,  normal LV function and wall motion , ef 70%  . Hyperplasia of prostate with lower urinary tract symptoms (LUTS)   . Hypertension   . Hypogonadism male   . Iron deficiency   . 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 Tresa Moore  . Prostate cancer [facility]) urologist-  dr t. Tresa Moore  oncologsit-  dr Tammi Klippel   dx 05-30-2018-- Stage T1c, Gleason 4+5, High Risk  . Renal cyst, left    followed by dr t. Tresa Moore--  chronic noncomplex  . Type 2 diabetes mellitus ([place])    followed by pcp  . Wears partial dentures    upper    Past Surgical History:   Procedure Laterality Date  . CIRCUMCISION  age 11  . KNEE ARTHROSCOPY Right 11/14/2017   dr Stann Mainland @[facility]     Family History  Problem Relation Age of Onset  . Lymphoma Father   . Cancer Father        lymphoma  . Hypertension Father   . Lymphoma Brother   . Cancer Brother        lymphoma  . Dementia Mother   . Diabetes Mother   . Diabetes Sister   . Cancer Brother        skin cancer  . Diabetes Brother   . Lymphoma Paternal Grandfather   . Cancer Paternal Grandfather        lymphoma  . Diabetes Brother   . Kidney disease Brother   . COPD Brother   . Heart disease Neg Hx    Social History:  reports that he has never smoked. He has never used smokeless tobacco. He reports current alcohol use of about 2.0 standard drinks of alcohol per week. He reports that he does not use drugs.  Allergies:  Allergies  Allergen Reactions  . Ace Inhibitors Cough    Medications Prior to Admission  Medication Sig Dispense Refill  .  clotrimazole (LOTRIMIN) 1 % cream Apply 1 application topically as needed.    Marland Kitchen glipiZIDE (GLUCOTROL) 5 MG tablet Take 1 tablet (5 mg total) by mouth 2 (two) times daily before a meal. Take 1/2 tablet by mouth with breakfast, and full tablet with dinner (Patient taking differently: Take 5 mg by mouth 2 (two) times daily before a meal. ) 60 tablet 0  . hydrochlorothiazide (HYDRODIURIL) 25 MG tablet TAKE 1 TABLET(25 MG) BY MOUTH DAILY (Patient taking differently: Take 25 mg by mouth daily. TAKE 1 TABLET(25 MG) BY MOUTH DAILY) 30 tablet 5  . losartan (COZAAR) 50 MG tablet TAKE 1 TABLET(50 MG) BY MOUTH DAILY (Patient taking differently: Take 50 mg by mouth daily with lunch. TAKE 1 TABLET(50 MG) BY MOUTH DAILY) 30 tablet 0  . Omega-3 Fatty Acids (FISH OIL) 1000 MG CAPS Take 1 capsule by mouth daily.     Marland Kitchen omeprazole (PRILOSEC) 20 MG capsule Take 20 mg by mouth as needed.    . pioglitazone (ACTOS) 45 MG tablet TAKE 1 TABLET(45 MG) BY MOUTH DAILY (Patient taking differently:  Take 45 mg by mouth. TAKE 1 TABLET(45 MG) BY MOUTH DAILY) 30 tablet 5  . saxagliptin HCl (ONGLYZA) 2.5 MG TABS tablet TAKE 1 TABLET(2.5 MG) BY MOUTH DAILY (Patient taking differently: Take 2.5 mg by mouth daily. TAKE 1 TABLET(2.5 MG) BY MOUTH DAILY) 30 tablet 5  . sildenafil (REVATIO) 20 MG tablet Take 2-5 tablets once daily as needed for erectile dysfunction (Patient taking differently: as needed. Take 2-5 tablets once daily as needed for erectile dysfunction) 100 tablet 0  . simvastatin (ZOCOR) 20 MG tablet TAKE 1 TABLET(20 MG) BY MOUTH AT BEDTIME (Patient taking differently: Take 20 mg by mouth at bedtime. TAKE 1 TABLET(20 MG) BY MOUTH AT BEDTIME) 30 tablet 5  . valACYclovir (VALTREX) 1000 MG tablet Take 2,000 mg by mouth as needed (fever blister). Reported on 04/09/2015    . aspirin 81 MG tablet Take 81 mg by mouth daily.      Marland Kitchen glucose blood test strip Use as instructed 100 each 3  . ONE TOUCH ULTRA TEST test strip USE AS DIRECTED 100 each 3  . ONETOUCH DELICA LANCETS FINE MISC U UTD  0    No results found for this or any previous visit (from the past 6 hour(s)). No results found.  Review of Systems  Constitutional: Negative for chills and fever.  All other systems reviewed and are negative.   Height 6' 3.25" (1.911 m), weight 120.2 kg. Physical Exam  Constitutional: He appears well-developed.  Eyes: Pupils are equal, round, and reactive to light.  Neck: Normal range of motion.  Respiratory: Effort normal.  GI:  Stable truncal obesity.   Genitourinary:    Genitourinary Comments: No CVAT   Musculoskeletal: Normal range of motion.  Neurological: He is alert.  Skin: Skin is warm.  Psychiatric: He has a normal mood and affect.     Assessment/Plan  Proceed as planned with prostate brachytherapy seed implantation with SPACE-OAR. Risks, benefits, alternatives, expected peri-op course discussed previously and reiterated today.   Alexis Frock, MD 11/03/2018, 7:30 AM

## 2018-11-03 NOTE — Anesthesia Preprocedure Evaluation (Signed)
Anesthesia Evaluation  Patient identified by MRN, date of birth, ID band Patient awake    Reviewed: Allergy & Precautions, NPO status , Patient's Chart, lab work & pertinent test results  Airway Mallampati: I  TM Distance: >3 FB Neck ROM: Full    Dental   Pulmonary    Pulmonary exam normal        Cardiovascular hypertension, Pt. on medications Normal cardiovascular exam     Neuro/Psych    GI/Hepatic GERD  Medicated and Controlled,  Endo/Other  diabetes, Type 2, Oral Hypoglycemic Agents  Renal/GU Renal InsufficiencyRenal disease     Musculoskeletal   Abdominal   Peds  Hematology   Anesthesia Other Findings   Reproductive/Obstetrics                             Anesthesia Physical Anesthesia Plan  ASA: II  Anesthesia Plan: General   Post-op Pain Management:    Induction: Intravenous  PONV Risk Score and Plan: 2 and Ondansetron, Midazolam and Treatment may vary due to age or medical condition  Airway Management Planned: LMA  Additional Equipment:   Intra-op Plan:   Post-operative Plan: Extubation in OR  Informed Consent: I have reviewed the patients History and Physical, chart, labs and discussed the procedure including the risks, benefits and alternatives for the proposed anesthesia with the patient or authorized representative who has indicated his/her understanding and acceptance.       Plan Discussed with: CRNA and Surgeon  Anesthesia Plan Comments:         Anesthesia Quick Evaluation

## 2018-11-03 NOTE — Progress Notes (Signed)
  Radiation Oncology         (336) [phone] ________________________________  Name: James Glover MRN: [id number]  Date: 11/03/2018  DOB: [birth year]       Prostate Seed Implant  [id number], Eve, MD  No ref. provider found  DIAGNOSIS: 73 y.o. gentleman with Stage T1c adenocarcinoma of the prostate with Gleason score of 4+5 and PSA of 4.77    ICD-10-CM   1. Preop testing  Z01.818 DG Chest 2 View    DG Chest 2 View    PROCEDURE: Insertion of radioactive I-125 seeds into the prostate gland.  RADIATION DOSE: 110 Gy, boost therapy.  TECHNIQUE: [name] was brought to the operating room with the urologist. He was placed in the dorsolithotomy position. He was catheterized and a rectal tube was inserted. The perineum was shaved, prepped and draped. The ultrasound probe was then introduced into the rectum to see the prostate gland.  TREATMENT DEVICE: A needle grid was attached to the ultrasound probe stand and anchor needles were placed.  3D PLANNING: The prostate was imaged in 3D using a sagittal sweep of the prostate probe. These images were transferred to the planning computer. There, the prostate, urethra and rectum were defined on each axial reconstructed image. Then, the software created an optimized 3D plan and a few seed positions were adjusted. The quality of the plan was reviewed using [facility] information for the target and the following two organs at risk:  Urethra and Rectum.  Then the accepted plan was printed and handed off to the radiation therapist.  Under my supervision, the custom loading of the seeds and spacers was carried out and loaded into sealed vicryl sleeves.  These pre-loaded needles were then placed into the needle holder.Marland Kitchen  PROSTATE VOLUME STUDY:  Using transrectal ultrasound the volume of the prostate was verified to be 23.9 cc.  SPECIAL TREATMENT PROCEDURE/SUPERVISION AND HANDLING: The pre-loaded needles were then delivered under sagittal guidance. A total of 21  needles were used to deposit 59 seeds in the prostate gland. The individual seed activity was 0.296 mCi.  SpaceOAR:  Yes  COMPLEX SIMULATION: At the end of the procedure, an anterior radiograph of the pelvis was obtained to document seed positioning and count. Cystoscopy was performed to check the urethra and bladder.  MICRODOSIMETRY: At the end of the procedure, the patient was emitting 0.039 mR/hr at 1 meter. Accordingly, he was considered safe for hospital discharge.  PLAN: The patient will return to the radiation oncology clinic for post implant CT dosimetry in three weeks.   ________________________________  Sheral Apley Tammi Klippel, M.D.

## 2018-11-03 NOTE — Discharge Instructions (Signed)
1 - You may have urinary urgency (bladder spasms) and bloody urine on / off for about 2 weeks. This is normal.  2 - Call MD or go to ER for fever >102, severe pain / nausea / vomiting not relieved by medications, or acute change in medical status   Post Anesthesia Home Care Instructions  Activity: Get plenty of rest for the remainder of the day. A responsible adult should stay with you for 24 hours following the procedure.  For the next 24 hours, DO NOT: -Drive a car -Paediatric nurse -Drink alcoholic beverages -Take any medication unless instructed by your physician -Make any legal decisions or sign important papers.  Meals: Start with liquid foods such as gelatin or soup. Progress to regular foods as tolerated. Avoid greasy, spicy, heavy foods. If nausea and/or vomiting occur, drink only clear liquids until the nausea and/or vomiting subsides. Call your physician if vomiting continues.  Special Instructions/Symptoms: Your throat may feel dry or sore from the anesthesia or the breathing tube placed in your throat during surgery. If this causes discomfort, gargle with warm salt water. The discomfort should disappear within 24 hours.  If you had a scopolamine patch placed behind your ear for the management of post- operative nausea and/or vomiting:  1. The medication in the patch is effective for 72 hours, after which it should be removed.  Wrap patch in a tissue and discard in the trash. Wash hands thoroughly with soap and water. 2. You may remove the patch earlier than 72 hours if you experience unpleasant side effects which may include dry mouth, dizziness or visual disturbances. 3. Avoid touching the patch. Wash your hands with soap and water after contact with the patch.

## 2018-11-07 ENCOUNTER — Encounter (HOSPITAL_BASED_OUTPATIENT_CLINIC_OR_DEPARTMENT_OTHER): Payer: Self-pay | Admitting: Urology

## 2018-11-17 ENCOUNTER — Other Ambulatory Visit: Payer: Self-pay | Admitting: Family Medicine

## 2018-11-17 DIAGNOSIS — I10 Essential (primary) hypertension: Secondary | ICD-10-CM

## 2018-11-21 ENCOUNTER — Telehealth: Payer: Self-pay | Admitting: *Deleted

## 2018-11-21 NOTE — Telephone Encounter (Signed)
Called patient to remind of sim appt. and MRI appt. for 11-22-18, spoke with patient and he is aware of these appts.

## 2018-11-22 ENCOUNTER — Ambulatory Visit: Payer: Self-pay | Admitting: Urology

## 2018-11-22 ENCOUNTER — Other Ambulatory Visit: Payer: Self-pay

## 2018-11-22 ENCOUNTER — Ambulatory Visit (HOSPITAL_COMMUNITY)
Admission: RE | Admit: 2018-11-22 | Discharge: 2018-11-22 | Disposition: A | Discharge status: Home / Self Care | Payer: Medicare Other | Source: Ambulatory Visit | Attending: Urology | Admitting: Urology

## 2018-11-22 ENCOUNTER — Ambulatory Visit
Admission: RE | Admit: 2018-11-22 | Discharge: 2018-11-22 | Disposition: A | Discharge status: Home / Self Care | Payer: Medicare Other | Source: Ambulatory Visit | Attending: Radiation Oncology | Admitting: Radiation Oncology

## 2018-11-22 DIAGNOSIS — C61 Malignant neoplasm of prostate: Secondary | ICD-10-CM | POA: Diagnosis not present

## 2018-11-23 NOTE — Progress Notes (Signed)
11/22/2018 1530. Called to lobby by Boykin Reaper to clarify confusion patient has about appointments. Met patient in lobby. Patient provided this RN with paperwork mailed to him by Romie Jumper. Patient's confusion is with the 1530 appointment with Dr. Tammi Klippel. Explained this appointment was made inadvertently. Explained that [employer], PA-C sees Dr. Johny Shears patients after their post seed CT simulation if they are not starting radiation therapy. Explained since he will begin radiation therapy on October 5th and visit with Dr. Tammi Klippel once a week the follow up appointment today at 1530 isn't necessary. Apologized for the error that created this confusion. Patient verbalized understanding and expressed appreciation for time spent. Patient verbalized understanding that he should present next for MRI to confirm Watson placement.

## 2018-11-26 NOTE — Addendum Note (Signed)
Encounter addended by: Tyler Pita, MD on: 11/26/2018 4:13 PM  Actions taken: Medication List reviewed, Problem List reviewed, Allergies reviewed, Clinical Note Signed

## 2018-11-26 NOTE — Progress Notes (Signed)
  Radiation Oncology         [phone] ________________________________  Name: James Glover MRN: [id number]  Date: 11/22/2018  DOB: 10/27/1945  COMPLEX SIMULATION NOTE  NARRATIVE:  The patient was brought to the [place] suite today following prostate seed implantation approximately one month ago.  Identity was confirmed.  All relevant records and images related to the planned course of therapy were reviewed.  Then, the patient was set-up supine.  CT images were obtained.  The CT images were loaded into the planning software.  Then the prostate and rectum were contoured.  Treatment planning then occurred.  The implanted iodine 125 seeds were identified by the physics staff for projection of radiation distribution  I have requested : 3D Simulation  I have requested a DVH of the following structures: Prostate and rectum.    ________________________________  Sheral Apley Tammi Klippel, M.D.

## 2018-11-26 NOTE — Progress Notes (Signed)
  Radiation Oncology         [phone] ________________________________  Name: James Glover MRN: [id number]  Date: 11/22/2018  DOB: 22-May-1945  SIMULATION AND TREATMENT PLANNING NOTE    ICD-10-CM   1. Prostate cancer [facility])  C61     DIAGNOSIS:   73 y.o. gentleman with Stage T1c adenocarcinoma of the prostate with Gleason score of 4+5 and PSA of 4.77   NARRATIVE:  The patient was brought to the [place].  Identity was confirmed.  All relevant records and images related to the planned course of therapy were reviewed.  The patient freely provided informed written consent to proceed with treatment after reviewing the details related to the planned course of therapy. The consent form was witnessed and verified by the simulation staff.  Then, the patient was set-up in a stable reproducible supine position for radiation therapy.  A vacuum lock pillow device was custom fabricated to position his legs in a reproducible immobilized position.  Then, I performed a urethrogram under sterile conditions to identify the prostatic apex.  CT images were obtained.  Surface markings were placed.  The CT images were loaded into the planning software.  Then the prostate target and avoidance structures including the rectum, bladder, bowel and hips were contoured.  Treatment planning then occurred.  The radiation prescription was entered and confirmed.  A total of one complex treatment devices were fabricated. I have requested : Intensity Modulated Radiotherapy (IMRT) is medically necessary for this case for the following reason:  Rectal sparing.Marland Kitchen  PLAN:  The patient will receive 45 Gy in 25 fractions of 1.8 Gy, to supplement an up-front prostate seed implant boost of 110 Gy to achieve a total nominal dose of 165 Gy.  ________________________________  Sheral Apley Tammi Klippel, M.D.

## 2018-11-28 DIAGNOSIS — C61 Malignant neoplasm of prostate: Secondary | ICD-10-CM | POA: Diagnosis not present

## 2018-12-04 ENCOUNTER — Ambulatory Visit
Admission: RE | Admit: 2018-12-04 | Discharge: 2018-12-04 | Disposition: A | Discharge status: Home / Self Care | Payer: Medicare Other | Source: Ambulatory Visit | Attending: Radiation Oncology | Admitting: Radiation Oncology

## 2018-12-04 ENCOUNTER — Other Ambulatory Visit: Payer: Self-pay

## 2018-12-04 DIAGNOSIS — C61 Malignant neoplasm of prostate: Secondary | ICD-10-CM | POA: Insufficient documentation

## 2018-12-05 ENCOUNTER — Ambulatory Visit
Admission: RE | Admit: 2018-12-05 | Discharge: 2018-12-05 | Disposition: A | Discharge status: Home / Self Care | Payer: Medicare Other | Source: Ambulatory Visit | Attending: Radiation Oncology | Admitting: Radiation Oncology

## 2018-12-05 ENCOUNTER — Other Ambulatory Visit: Payer: Self-pay

## 2018-12-05 DIAGNOSIS — C61 Malignant neoplasm of prostate: Secondary | ICD-10-CM | POA: Diagnosis not present

## 2018-12-06 ENCOUNTER — Other Ambulatory Visit: Payer: Self-pay

## 2018-12-06 ENCOUNTER — Ambulatory Visit
Admission: RE | Admit: 2018-12-06 | Discharge: 2018-12-06 | Disposition: A | Discharge status: Home / Self Care | Payer: Medicare Other | Source: Ambulatory Visit | Attending: Radiation Oncology | Admitting: Radiation Oncology

## 2018-12-06 DIAGNOSIS — C61 Malignant neoplasm of prostate: Secondary | ICD-10-CM | POA: Diagnosis not present

## 2018-12-07 ENCOUNTER — Ambulatory Visit
Admission: RE | Admit: 2018-12-07 | Discharge: 2018-12-07 | Disposition: A | Discharge status: Home / Self Care | Payer: Medicare Other | Source: Ambulatory Visit | Attending: Radiation Oncology | Admitting: Radiation Oncology

## 2018-12-07 ENCOUNTER — Other Ambulatory Visit: Payer: Self-pay

## 2018-12-07 DIAGNOSIS — C61 Malignant neoplasm of prostate: Secondary | ICD-10-CM | POA: Diagnosis not present

## 2018-12-08 ENCOUNTER — Other Ambulatory Visit: Payer: Self-pay

## 2018-12-08 ENCOUNTER — Ambulatory Visit
Admission: RE | Admit: 2018-12-08 | Discharge: 2018-12-08 | Disposition: A | Discharge status: Home / Self Care | Payer: Medicare Other | Source: Ambulatory Visit | Attending: Radiation Oncology | Admitting: Radiation Oncology

## 2018-12-08 DIAGNOSIS — C61 Malignant neoplasm of prostate: Secondary | ICD-10-CM | POA: Diagnosis not present

## 2018-12-11 ENCOUNTER — Ambulatory Visit
Admission: RE | Admit: 2018-12-11 | Discharge: 2018-12-11 | Disposition: A | Discharge status: Home / Self Care | Payer: Medicare Other | Source: Ambulatory Visit | Attending: Radiation Oncology | Admitting: Radiation Oncology

## 2018-12-11 ENCOUNTER — Other Ambulatory Visit: Payer: Self-pay

## 2018-12-11 DIAGNOSIS — C61 Malignant neoplasm of prostate: Secondary | ICD-10-CM | POA: Diagnosis not present

## 2018-12-12 ENCOUNTER — Other Ambulatory Visit: Payer: Self-pay

## 2018-12-12 ENCOUNTER — Ambulatory Visit
Admission: RE | Admit: 2018-12-12 | Discharge: 2018-12-12 | Disposition: A | Discharge status: Home / Self Care | Payer: Medicare Other | Source: Ambulatory Visit | Attending: Radiation Oncology | Admitting: Radiation Oncology

## 2018-12-12 DIAGNOSIS — C61 Malignant neoplasm of prostate: Secondary | ICD-10-CM | POA: Diagnosis not present

## 2018-12-13 ENCOUNTER — Other Ambulatory Visit: Payer: Self-pay

## 2018-12-13 ENCOUNTER — Ambulatory Visit
Admission: RE | Admit: 2018-12-13 | Discharge: 2018-12-13 | Disposition: A | Discharge status: Home / Self Care | Payer: Medicare Other | Source: Ambulatory Visit | Attending: Radiation Oncology | Admitting: Radiation Oncology

## 2018-12-13 DIAGNOSIS — C61 Malignant neoplasm of prostate: Secondary | ICD-10-CM | POA: Diagnosis not present

## 2018-12-14 ENCOUNTER — Ambulatory Visit
Admission: RE | Admit: 2018-12-14 | Discharge: 2018-12-14 | Disposition: A | Discharge status: Home / Self Care | Payer: Medicare Other | Source: Ambulatory Visit | Attending: Radiation Oncology | Admitting: Radiation Oncology

## 2018-12-14 ENCOUNTER — Other Ambulatory Visit: Payer: Self-pay

## 2018-12-14 DIAGNOSIS — C61 Malignant neoplasm of prostate: Secondary | ICD-10-CM | POA: Diagnosis not present

## 2018-12-15 ENCOUNTER — Other Ambulatory Visit: Payer: Self-pay

## 2018-12-15 ENCOUNTER — Ambulatory Visit
Admission: RE | Admit: 2018-12-15 | Discharge: 2018-12-15 | Disposition: A | Discharge status: Home / Self Care | Payer: Medicare Other | Source: Ambulatory Visit | Attending: Radiation Oncology | Admitting: Radiation Oncology

## 2018-12-15 DIAGNOSIS — C61 Malignant neoplasm of prostate: Secondary | ICD-10-CM | POA: Diagnosis not present

## 2018-12-18 ENCOUNTER — Other Ambulatory Visit: Payer: Self-pay

## 2018-12-18 ENCOUNTER — Ambulatory Visit
Admission: RE | Admit: 2018-12-18 | Discharge: 2018-12-18 | Disposition: A | Discharge status: Home / Self Care | Payer: Medicare Other | Source: Ambulatory Visit | Attending: Radiation Oncology | Admitting: Radiation Oncology

## 2018-12-18 DIAGNOSIS — C61 Malignant neoplasm of prostate: Secondary | ICD-10-CM | POA: Diagnosis not present

## 2018-12-19 ENCOUNTER — Other Ambulatory Visit: Payer: Self-pay

## 2018-12-19 ENCOUNTER — Ambulatory Visit
Admission: RE | Admit: 2018-12-19 | Discharge: 2018-12-19 | Disposition: A | Discharge status: Home / Self Care | Payer: Medicare Other | Source: Ambulatory Visit | Attending: Radiation Oncology | Admitting: Radiation Oncology

## 2018-12-19 DIAGNOSIS — C61 Malignant neoplasm of prostate: Secondary | ICD-10-CM | POA: Diagnosis not present

## 2018-12-20 ENCOUNTER — Ambulatory Visit
Admission: RE | Admit: 2018-12-20 | Discharge: 2018-12-20 | Disposition: A | Discharge status: Home / Self Care | Payer: Medicare Other | Source: Ambulatory Visit | Attending: Radiation Oncology | Admitting: Radiation Oncology

## 2018-12-20 ENCOUNTER — Other Ambulatory Visit: Payer: Self-pay

## 2018-12-20 DIAGNOSIS — C61 Malignant neoplasm of prostate: Secondary | ICD-10-CM | POA: Diagnosis not present

## 2018-12-21 ENCOUNTER — Other Ambulatory Visit: Payer: Self-pay

## 2018-12-21 ENCOUNTER — Ambulatory Visit
Admission: RE | Admit: 2018-12-21 | Discharge: 2018-12-21 | Disposition: A | Discharge status: Home / Self Care | Payer: Medicare Other | Source: Ambulatory Visit | Attending: Radiation Oncology | Admitting: Radiation Oncology

## 2018-12-21 DIAGNOSIS — C61 Malignant neoplasm of prostate: Secondary | ICD-10-CM | POA: Diagnosis not present

## 2018-12-22 ENCOUNTER — Ambulatory Visit
Admission: RE | Admit: 2018-12-22 | Discharge: 2018-12-22 | Disposition: A | Discharge status: Home / Self Care | Payer: Medicare Other | Source: Ambulatory Visit | Attending: Radiation Oncology | Admitting: Radiation Oncology

## 2018-12-22 ENCOUNTER — Other Ambulatory Visit: Payer: Self-pay | Admitting: Radiation Oncology

## 2018-12-22 ENCOUNTER — Other Ambulatory Visit: Payer: Self-pay

## 2018-12-22 DIAGNOSIS — C61 Malignant neoplasm of prostate: Secondary | ICD-10-CM | POA: Diagnosis not present

## 2018-12-25 ENCOUNTER — Ambulatory Visit
Admission: RE | Admit: 2018-12-25 | Discharge: 2018-12-25 | Disposition: A | Discharge status: Home / Self Care | Payer: Medicare Other | Source: Ambulatory Visit | Attending: Radiation Oncology | Admitting: Radiation Oncology

## 2018-12-25 ENCOUNTER — Other Ambulatory Visit: Payer: Self-pay

## 2018-12-25 DIAGNOSIS — C61 Malignant neoplasm of prostate: Secondary | ICD-10-CM | POA: Diagnosis not present

## 2018-12-26 ENCOUNTER — Other Ambulatory Visit: Payer: Self-pay

## 2018-12-26 ENCOUNTER — Ambulatory Visit
Admission: RE | Admit: 2018-12-26 | Discharge: 2018-12-26 | Disposition: A | Discharge status: Home / Self Care | Payer: Medicare Other | Source: Ambulatory Visit | Attending: Radiation Oncology | Admitting: Radiation Oncology

## 2018-12-26 DIAGNOSIS — C61 Malignant neoplasm of prostate: Secondary | ICD-10-CM | POA: Diagnosis not present

## 2018-12-26 NOTE — Progress Notes (Signed)
Chief Complaint  Patient presents with  . Blood Sugar Problem    readings in the mid to high 200's for the last 2 months since radiation started.     Patient presents to discuss elevated blood sugars. He is getting radiation for prostate cancer. Next week will be the last week of radiation. He is feeling fatigued, frequent urination and some intermittent diarrhea. He noted his sugars increased after getting the pellets inserted in early September, and his sugars have stayed high, despite him trying his best to get them down on his own.  He cut out on bread, sweets, decreased portions, but he can't get the sugars down. Sugars have been 203-276, fasting.  His last visit for diabetes was in June 2020. His A1c was 7.2.  At that time he reported that his sugars were running 136-184 (frequently 140's-160's or higher) in the morning; evening 103-160, average 120's-130.  He is compliant with pioglitazone 45mg , glipizide 5mg  BID and Onglyza 2.5mg  daily (lower dose due to CKD). He denies any hypoglycemia.  Denies polydipsia. Some urinary frequency which is likely contributed by his radiation therapy for prostate cancer. Lab Results  Component Value Date   CREATININE 1.84 (H) 10/31/2018   He continues to avoid carbs, sweets. Lab Results  Component Value Date   HGBA1C 7.2 (H) 08/14/2018   He recently had a cellulitis on his RLE, [place], and resolved.  He has been off the antibiotics for about 2 weeks.  PMH, PSH, SH reviewed  Outpatient Encounter Medications as of 12/27/2018  Medication Sig Note  . aspirin 81 MG tablet Take 81 mg by mouth daily.     Marland Kitchen glucose blood test strip Use as instructed   . hydrochlorothiazide (HYDRODIURIL) 25 MG tablet TAKE 1 TABLET(25 MG) BY MOUTH DAILY (Patient taking differently: Take 25 mg by mouth daily. TAKE 1 TABLET(25 MG) BY MOUTH DAILY)   . losartan (COZAAR) 50 MG tablet Take one daily   . Omega-3  Fatty Acids (FISH OIL) 1000 MG CAPS Take 1 capsule by mouth daily.    Marland Kitchen omeprazole (PRILOSEC) 40 MG capsule TK 1 C PO 20 MIN B BRE   . ONE TOUCH ULTRA TEST test strip USE AS DIRECTED   . ONETOUCH DELICA LANCETS FINE MISC U UTD 04/09/2015: Received from: External Pharmacy  . pioglitazone (ACTOS) 45 MG tablet TAKE 1 TABLET(45 MG) BY MOUTH DAILY (Patient taking differently: Take 45 mg by mouth. TAKE 1 TABLET(45 MG) BY MOUTH DAILY)   . saxagliptin HCl (ONGLYZA) 2.5 MG TABS tablet TAKE 1 TABLET(2.5 MG) BY MOUTH DAILY (Patient taking differently: Take 2.5 mg by mouth daily. TAKE 1 TABLET(2.5 MG) BY MOUTH DAILY)   . simvastatin (ZOCOR) 20 MG tablet TAKE 1 TABLET(20 MG) BY MOUTH AT BEDTIME (Patient taking differently: Take 20 mg by mouth at bedtime. TAKE 1 TABLET(20 MG) BY MOUTH AT BEDTIME)   . tamsulosin (FLOMAX) 0.4 MG CAPS capsule Take 1 capsule (0.4 mg total) by mouth daily as needed. For urinary urgency or weak stream.   . [DISCONTINUED] glipiZIDE (GLUCOTROL) 5 MG tablet Take 1 tablet (5 mg total) by mouth 2 (two) times daily before a meal. Take 1/2 tablet by mouth with breakfast, and full tablet with dinner (Patient taking differently: Take 5 mg by mouth 2 (two) times daily before a meal. ) 12/27/2018: starting insulin  . [DISCONTINUED] losartan (COZAAR) 50 MG tablet TAKE 1 TABLET(50 MG) BY MOUTH DAILY   .  clotrimazole (LOTRIMIN) 1 % cream Apply 1 application topically as needed.   . insulin degludec (TRESIBA FLEXTOUCH) 100 UNIT/ML SOPN FlexTouch Pen Inject 0.1 mLs (10 Units total) into the skin daily.   . sildenafil (REVATIO) 20 MG tablet Take 2-5 tablets once daily as needed for erectile dysfunction (Patient not taking: Reported on 12/27/2018)   . valACYclovir (VALTREX) 1000 MG tablet Take 2,000 mg by mouth as needed (fever blister). Reported on 04/09/2015 10/10/2014: From her dermatologist [facility]); prn fever blisters  . [DISCONTINUED] omeprazole (PRILOSEC) 20 MG capsule Take 20 mg by mouth as needed.    . [DISCONTINUED] traMADol (ULTRAM) 50 MG tablet Take 1-2 tablets (50-100 mg total) by mouth every 6 (six) hours as needed for moderate pain or severe pain. Post-operatively.    No facility-administered encounter medications on file as of 12/27/2018.    (NOT taking Tyler Aas prior to today's visit, and was taking glipizide).  Allergies  Allergen Reactions  . Ace Inhibitors Cough    ROS: no fever, chills, URI symptoms, chest pain, shortness of breath, nausea, vomiting.  He has urinary frequency, intermittent diarrhea related to his radiation, along with some fatigue.  No bleeding, bruising, rash.  Recent cellulitis on RLE has resolved, wound is almost healed.  Moods are okay.   PHYSICAL EXAM:  BP 120/80   Pulse 76   Temp (!) 96.4 F (35.8 C) (Other (Comment))   Ht 6' 3.25" (1.911 m)   Wt 274 lb (124.3 kg)   BMI 34.02 kg/m   Wt Readings from Last 3 Encounters:  11/03/18 273 lb (123.8 kg)  08/16/18 268 lb (121.6 kg)  08/14/18 268 lb (121.6 kg)   Well-appearing, slightly pale, anxious male, in no distress HEENT: conjunctiva and sclera are clear, EOMI, wearing mask Heart: regular rate and rhythm Lungs: clear Back: no CVA tenderness Abdomen: soft, nontender Extremities: trace pretibial edema.  Dry skin on shins with healing scab (area of prior cellulitis) Neuro: alert and oriented, normal gait Psych: anxious, full range of affect, normal eye contact, grooming.   Lab Results  Component Value Date   HGBA1C 9.2 (A) 12/27/2018   Glu 276 (nonfasting) Urine dip: glucose, otherwise normal  ASSESSMENT/PLAN:  Uncontrolled type 2 diabetes mellitus with hyperglycemia (HCC) - start basal insulin--given first dose of 10 units of Tresiba, shown how to use, given rest of sample pen. stop glipizide, cont other meds.  - Plan: insulin degludec (TRESIBA FLEXTOUCH) 100 UNIT/ML SOPN FlexTouch Pen, Glucose (CBG)  Diabetes mellitus without complication (HCC) - Plan: HgB A1c, Glucose  (CBG)  Urinary frequency - no e/o infection.  Related to radiation, and possibly higher sugars - Plan: POCT Urinalysis DIP (Proadvantage Device)  He was shown how to use the [place] pen, given detailed instructions. Discussed monitoring his blood sugars, risks of hypoglycemia, and to check sugar if feels bad (as well as always having candy/juice around in case). He is to f/u in 2 weeks with blood sugars, but can contact us sooner with any questions.  Starting at 10 units, and titrating up 2 units every 3 days that morning sugars remain over 150.    Stop the glipizide . Continue the pioglitazone and onglyza. Start the insulin (you got your first dose in the office, so start tomorrow night). Use 10 units for the first 3 days. Check your sugars every morning (and more often, if desired). You will adjust your insulin dose based on the MORNING blood sugars. If your blood sugar remains over 150 after 3 days at  10 units, then increase to 12 units. You may continue to increase by 2 units every 3 days as long as your fasting sugar remains over 150.  If it drops into the 130-140's, keep it at the same dose. If you start seeing sugars under 80-90, consistently, then cut back the dose by 2 units.   We want to avoid having low blood sugars (under 60 is dangerous)  Be sure to always have a piece of candy on hand, so that if your sugar drops low and you feel shaky/weak, you have something to eat.

## 2018-12-27 ENCOUNTER — Ambulatory Visit (INDEPENDENT_AMBULATORY_CARE_PROVIDER_SITE_OTHER): Payer: Medicare Other | Admitting: Family Medicine

## 2018-12-27 ENCOUNTER — Ambulatory Visit
Admission: RE | Admit: 2018-12-27 | Discharge: 2018-12-27 | Disposition: A | Discharge status: Home / Self Care | Payer: Medicare Other | Source: Ambulatory Visit | Attending: Radiation Oncology | Admitting: Radiation Oncology

## 2018-12-27 ENCOUNTER — Other Ambulatory Visit: Payer: Self-pay | Admitting: *Deleted

## 2018-12-27 ENCOUNTER — Other Ambulatory Visit: Payer: Self-pay

## 2018-12-27 ENCOUNTER — Encounter: Payer: Self-pay | Admitting: Family Medicine

## 2018-12-27 VITALS — BP 120/80 | HR 76 | Temp 96.4°F | Ht 75.25 in | Wt 274.0 lb

## 2018-12-27 DIAGNOSIS — C61 Malignant neoplasm of prostate: Secondary | ICD-10-CM | POA: Diagnosis not present

## 2018-12-27 DIAGNOSIS — I10 Essential (primary) hypertension: Secondary | ICD-10-CM

## 2018-12-27 DIAGNOSIS — E119 Type 2 diabetes mellitus without complications: Secondary | ICD-10-CM

## 2018-12-27 DIAGNOSIS — R350 Frequency of micturition: Secondary | ICD-10-CM

## 2018-12-27 DIAGNOSIS — E1165 Type 2 diabetes mellitus with hyperglycemia: Secondary | ICD-10-CM | POA: Diagnosis not present

## 2018-12-27 LAB — POCT URINALYSIS DIP (PROADVANTAGE DEVICE)
Bilirubin, UA: NEGATIVE
Blood, UA: NEGATIVE
Glucose, UA: 100 mg/dL — AB
Ketones, POC UA: NEGATIVE mg/dL
Leukocytes, UA: NEGATIVE
Nitrite, UA: NEGATIVE
Protein Ur, POC: NEGATIVE mg/dL
Specific Gravity, Urine: 1.02
Urobilinogen, Ur: NEGATIVE
pH, UA: 6 (ref 5.0–8.0)

## 2018-12-27 LAB — POCT GLYCOSYLATED HEMOGLOBIN (HGB A1C): Hemoglobin A1C: 9.2 % — AB (ref 4.0–5.6)

## 2018-12-27 LAB — GLUCOSE, POCT (MANUAL RESULT ENTRY): POC Glucose: 276 mg/dl — AB (ref 70–99)

## 2018-12-27 MED ORDER — TRESIBA FLEXTOUCH 100 UNIT/ML ~~LOC~~ SOPN: 10.0000 [IU] | PEN_INJECTOR | Freq: Every day | SUBCUTANEOUS | 1 refills | Status: DC

## 2018-12-27 MED ORDER — LOSARTAN POTASSIUM 50 MG PO TABS: ORAL_TABLET | ORAL | 1 refills | Status: DC

## 2018-12-27 MED ORDER — NOVOFINE PLUS 32G X 4 MM MISC: 1.0000 | Freq: Every day | 0 refills | Status: DC

## 2018-12-27 NOTE — Patient Instructions (Addendum)
  Stop the glipizide . Continue the pioglitazone and onglyza. Start the insulin (you got your first dose in the office, so start tomorrow night). The insulin is to be taken once daily, at nighttime. Use 10 units for the first 3 days.  Check your sugars every morning (and more often, if desired). You will adjust your insulin dose based on the MORNING blood sugars. If your blood sugar remains over 150 after 3 days at 10 units, then increase to 12 units. You may continue to increase by 2 units every 3 days as long as your fasting sugar remains over 150.  If it drops into the 130-140's, keep it at the same dose. If you start seeing sugars under 80-90, consistently, then cut back the dose by 2 units.   We want to avoid having low blood sugars (under 60 is dangerous)  Be sure to always have a piece of candy on hand, so that if your sugar drops low and you feel shaky/weak, you have something to eat.

## 2018-12-28 ENCOUNTER — Other Ambulatory Visit: Payer: Self-pay

## 2018-12-28 ENCOUNTER — Ambulatory Visit
Admission: RE | Admit: 2018-12-28 | Discharge: 2018-12-28 | Disposition: A | Discharge status: Home / Self Care | Payer: Medicare Other | Source: Ambulatory Visit | Attending: Radiation Oncology | Admitting: Radiation Oncology

## 2018-12-28 DIAGNOSIS — C61 Malignant neoplasm of prostate: Secondary | ICD-10-CM | POA: Diagnosis not present

## 2018-12-29 ENCOUNTER — Other Ambulatory Visit: Payer: Self-pay

## 2018-12-29 ENCOUNTER — Ambulatory Visit
Admission: RE | Admit: 2018-12-29 | Discharge: 2018-12-29 | Disposition: A | Discharge status: Home / Self Care | Payer: Medicare Other | Source: Ambulatory Visit | Attending: Radiation Oncology | Admitting: Radiation Oncology

## 2018-12-29 ENCOUNTER — Encounter: Payer: Self-pay | Admitting: Radiation Oncology

## 2018-12-29 DIAGNOSIS — C61 Malignant neoplasm of prostate: Secondary | ICD-10-CM | POA: Diagnosis not present

## 2019-01-01 ENCOUNTER — Ambulatory Visit
Admission: RE | Admit: 2019-01-01 | Discharge: 2019-01-01 | Disposition: A | Discharge status: Home / Self Care | Payer: Medicare Other | Source: Ambulatory Visit | Attending: Radiation Oncology | Admitting: Radiation Oncology

## 2019-01-01 ENCOUNTER — Other Ambulatory Visit: Payer: Self-pay

## 2019-01-01 DIAGNOSIS — C61 Malignant neoplasm of prostate: Secondary | ICD-10-CM | POA: Diagnosis not present

## 2019-01-02 ENCOUNTER — Other Ambulatory Visit: Payer: Self-pay

## 2019-01-02 ENCOUNTER — Ambulatory Visit
Admission: RE | Admit: 2019-01-02 | Discharge: 2019-01-02 | Disposition: A | Discharge status: Home / Self Care | Payer: Medicare Other | Source: Ambulatory Visit | Attending: Radiation Oncology | Admitting: Radiation Oncology

## 2019-01-02 DIAGNOSIS — C61 Malignant neoplasm of prostate: Secondary | ICD-10-CM | POA: Diagnosis not present

## 2019-01-03 ENCOUNTER — Ambulatory Visit
Admission: RE | Admit: 2019-01-03 | Discharge: 2019-01-03 | Disposition: A | Discharge status: Home / Self Care | Payer: Medicare Other | Source: Ambulatory Visit | Attending: Radiation Oncology | Admitting: Radiation Oncology

## 2019-01-03 ENCOUNTER — Other Ambulatory Visit: Payer: Self-pay

## 2019-01-03 DIAGNOSIS — C61 Malignant neoplasm of prostate: Secondary | ICD-10-CM | POA: Diagnosis not present

## 2019-01-04 ENCOUNTER — Other Ambulatory Visit: Payer: Self-pay

## 2019-01-04 ENCOUNTER — Ambulatory Visit
Admission: RE | Admit: 2019-01-04 | Discharge: 2019-01-04 | Disposition: A | Discharge status: Home / Self Care | Payer: Medicare Other | Source: Ambulatory Visit | Attending: Radiation Oncology | Admitting: Radiation Oncology

## 2019-01-04 DIAGNOSIS — C61 Malignant neoplasm of prostate: Secondary | ICD-10-CM | POA: Diagnosis not present

## 2019-01-05 ENCOUNTER — Other Ambulatory Visit: Payer: Self-pay

## 2019-01-05 ENCOUNTER — Encounter: Payer: Self-pay | Admitting: Radiation Oncology

## 2019-01-05 ENCOUNTER — Ambulatory Visit
Admission: RE | Admit: 2019-01-05 | Discharge: 2019-01-05 | Disposition: A | Discharge status: Home / Self Care | Payer: Medicare Other | Source: Ambulatory Visit | Attending: Radiation Oncology | Admitting: Radiation Oncology

## 2019-01-05 DIAGNOSIS — C61 Malignant neoplasm of prostate: Secondary | ICD-10-CM | POA: Diagnosis not present

## 2019-01-10 ENCOUNTER — Telehealth: Payer: Self-pay | Admitting: Radiation Oncology

## 2019-01-10 NOTE — Telephone Encounter (Signed)
Mailed completed and MD signed [place] [employer] to patient's home at [place] [place], [place].

## 2019-01-10 NOTE — Progress Notes (Signed)
Chief Complaint  Patient presents with  . Follow-up    2 week follow up on blood sugars. Brought in list of sugars and is now up to 16 units and blood sugar was 130 this am.     Patient presents to f/u on DM.  He completed his course of radiation for prostate cancer last week.  He had noted his blood sugars were high after starting the treatment.   Lab Results  Component Value Date   HGBA1C 9.2 (A) 12/27/2018   He was started on Tresiba 10 U.  His glipizide was stopped, but he continues to take pioglitazone and onglyza.  Instructions were to start at 10U, and titrate up 2U every 3 days that morning sugars remained over 150. His current dose is 16 Units.  Sugar this morning was 130 (yesterday was 201 on same dose), has been 150-200 (1x257) since 10/30.  Denies hypoglycemia, polydipsia; + polyuria (related to radiation).  He has fatigue related to his treatments, but overall is feeling well.  PMH, PSH, SH reviewed  Outpatient Encounter Medications as of 01/11/2019  Medication Sig Note  . aspirin 81 MG tablet Take 81 mg by mouth daily.     Marland Kitchen glucose blood test strip Use as instructed   . hydrochlorothiazide (HYDRODIURIL) 25 MG tablet TAKE 1 TABLET(25 MG) BY MOUTH DAILY (Patient taking differently: Take 25 mg by mouth daily. TAKE 1 TABLET(25 MG) BY MOUTH DAILY)   . insulin degludec (TRESIBA FLEXTOUCH) 100 UNIT/ML SOPN FlexTouch Pen Inject 0.1 mLs (10 Units total) into the skin daily. (Patient taking differently: Inject 16 Units into the skin daily. )   . Insulin Pen Needle (NOVOFINE PLUS) 32G X 4 MM MISC 1 each by Does not apply route daily.   Marland Kitchen losartan (COZAAR) 50 MG tablet Take one daily   . Omega-3 Fatty Acids (FISH OIL) 1000 MG CAPS Take 1 capsule by mouth daily.    Marland Kitchen omeprazole (PRILOSEC) 40 MG capsule TK 1 C PO 20 MIN B BRE   . ONE TOUCH ULTRA TEST test strip USE AS DIRECTED   . ONETOUCH DELICA LANCETS FINE MISC U UTD 04/09/2015: Received from: External Pharmacy  . pioglitazone  (ACTOS) 45 MG tablet TAKE 1 TABLET(45 MG) BY MOUTH DAILY (Patient taking differently: Take 45 mg by mouth. TAKE 1 TABLET(45 MG) BY MOUTH DAILY)   . saxagliptin HCl (ONGLYZA) 2.5 MG TABS tablet TAKE 1 TABLET(2.5 MG) BY MOUTH DAILY (Patient taking differently: Take 2.5 mg by mouth daily. TAKE 1 TABLET(2.5 MG) BY MOUTH DAILY)   . simvastatin (ZOCOR) 20 MG tablet TAKE 1 TABLET(20 MG) BY MOUTH AT BEDTIME (Patient taking differently: Take 20 mg by mouth at bedtime. TAKE 1 TABLET(20 MG) BY MOUTH AT BEDTIME)   . tamsulosin (FLOMAX) 0.4 MG CAPS capsule Take 1 capsule (0.4 mg total) by mouth daily as needed. For urinary urgency or weak stream.   . clotrimazole (LOTRIMIN) 1 % cream Apply 1 application topically as needed.   . sildenafil (REVATIO) 20 MG tablet Take 2-5 tablets once daily as needed for erectile dysfunction (Patient not taking: Reported on 12/27/2018)   . valACYclovir (VALTREX) 1000 MG tablet Take 2,000 mg by mouth as needed (fever blister). Reported on 04/09/2015 10/10/2014: From her dermatologist [facility]); prn fever blisters   No facility-administered encounter medications on file as of 01/11/2019.    Allergies  Allergen Reactions  . Ace Inhibitors Cough    ROS: no fever, chills, headaches, dizziness, chest pain, shortness of breath, palpitations. No  edema.  No bleeding, bruising, rash. No URI symptoms, cough. No numbness/tingling. +fatigue (related to treatments)   PHYSICAL EXAM:  BP 136/60   Pulse 60   Temp (!) 96.9 F (36.1 C) (Other (Comment))   Ht 6' 3.25" (1.911 m)   Wt 275 lb (124.7 kg)   BMI 34.14 kg/m   Wt Readings from Last 3 Encounters:  01/11/19 275 lb (124.7 kg)  12/27/18 274 lb (124.3 kg)  11/03/18 273 lb (123.8 kg)    Well-appearing, pleasant, obese male, in good spirits. He is alert, oriented, normal gait. Normal mood, affect, hygiene and grooming. Abdomen: obese.  There are no nodules, bruises or other abnormalities noted at his injection  sites.   ASSESSMENT/PLAN:  Uncontrolled type 2 diabetes mellitus with hyperglycemia (HCC) - improving since insulin started.  Reviewed proper titration (and to titrate down if sugars drop)   F/u as scheduled 12/9 for CPE/AWV

## 2019-01-11 ENCOUNTER — Ambulatory Visit (INDEPENDENT_AMBULATORY_CARE_PROVIDER_SITE_OTHER): Payer: Medicare Other | Admitting: Family Medicine

## 2019-01-11 ENCOUNTER — Other Ambulatory Visit: Payer: Self-pay

## 2019-01-11 ENCOUNTER — Encounter: Payer: Self-pay | Admitting: Family Medicine

## 2019-01-11 VITALS — BP 136/60 | HR 60 | Temp 96.9°F | Ht 75.25 in | Wt 275.0 lb

## 2019-01-11 DIAGNOSIS — E1165 Type 2 diabetes mellitus with hyperglycemia: Secondary | ICD-10-CM

## 2019-01-11 NOTE — Patient Instructions (Signed)
Continue the [place] at 16 Units. If sugars go above 150 again, and stay there, titrate up further.   Feel free to jot some notes/comments as to why the sugars may be high or low on certain days (snacks, exercise, etc).  Feel free to send [place] any updates on your sugars through MyChart if you need advice as far as your dosing.  Keep up the good work--see you next month!

## 2019-01-13 NOTE — Progress Notes (Signed)
  Radiation Oncology         [phone] ________________________________  Name: James Glover MRN: [id number]  Date: 12/29/2018  DOB: 05-02-1945  3D Planning Note   Prostate Brachytherapy Post-Implant Dosimetry  Diagnosis: 73 y.o. gentleman with Stage T1c adenocarcinoma of the prostate with Gleason score of 4+5 and PSA of 4.77   Narrative: On a previous date, James Glover returned following prostate seed implantation for post implant planning. He underwent CT scan complex simulation to delineate the three-dimensional structures of the pelvis and demonstrate the radiation distribution.  Since that time, the seed localization, and complex isodose planning with dose volume histograms have now been completed.  Results:   Prostate Coverage - The dose of radiation delivered to the 90% or more of the prostate gland (D90) was 96.04% of the prescription dose. This exceeds our goal of greater than 90%. Rectal Sparing - The volume of rectal tissue receiving the prescription dose or higher was 0.0 cc. This falls under our thresholds tolerance of 1.0 cc.  Impression: The prostate seed implant appears to show adequate target coverage and appropriate rectal sparing.  Plan:  The patient will continue to follow with urology for ongoing PSA determinations. I would anticipate a high likelihood for local tumor control with minimal risk for rectal morbidity.  ________________________________  Sheral Apley Tammi Klippel, M.D.

## 2019-02-05 ENCOUNTER — Other Ambulatory Visit: Payer: Self-pay

## 2019-02-05 ENCOUNTER — Other Ambulatory Visit: Payer: Medicare Other

## 2019-02-05 DIAGNOSIS — E1121 Type 2 diabetes mellitus with diabetic nephropathy: Secondary | ICD-10-CM

## 2019-02-05 DIAGNOSIS — C61 Malignant neoplasm of prostate: Secondary | ICD-10-CM

## 2019-02-05 DIAGNOSIS — Z5181 Encounter for therapeutic drug level monitoring: Secondary | ICD-10-CM

## 2019-02-06 LAB — COMPREHENSIVE METABOLIC PANEL
ALT: 18 IU/L (ref 0–44)
AST: 16 IU/L (ref 0–40)
Albumin/Globulin Ratio: 1.6 (ref 1.2–2.2)
Albumin: 3.6 g/dL — ABNORMAL LOW (ref 3.7–4.7)
Alkaline Phosphatase: 46 IU/L (ref 39–117)
BUN/Creatinine Ratio: 13 (ref 10–24)
BUN: 27 mg/dL (ref 8–27)
Bilirubin Total: 0.3 mg/dL (ref 0.0–1.2)
CO2: 24 mmol/L (ref 20–29)
Calcium: 8.7 mg/dL (ref 8.6–10.2)
Chloride: 102 mmol/L (ref 96–106)
Creatinine, Ser: 2.05 mg/dL — ABNORMAL HIGH (ref 0.76–1.27)
GFR calc Af Amer: 36 mL/min/{1.73_m2} — ABNORMAL LOW (ref >59)
GFR calc non Af Amer: 31 mL/min/{1.73_m2} — ABNORMAL LOW (ref >59)
Globulin, Total: 2.2 g/dL (ref 1.5–4.5)
Glucose: 123 mg/dL — ABNORMAL HIGH (ref 65–99)
Potassium: 4.4 mmol/L (ref 3.5–5.2)
Sodium: 138 mmol/L (ref 134–144)
Total Protein: 5.8 g/dL — ABNORMAL LOW (ref 6.0–8.5)

## 2019-02-06 LAB — HEMOGLOBIN A1C
Est. average glucose Bld gHb Est-mCnc: 197 mg/dL
Hgb A1c MFr Bld: 8.5 % — ABNORMAL HIGH (ref 4.8–5.6)

## 2019-02-06 LAB — CBC WITH DIFFERENTIAL/PLATELET
Basophils Absolute: 0 10*3/uL (ref 0.0–0.2)
Basos: 1 %
EOS (ABSOLUTE): 0.2 10*3/uL (ref 0.0–0.4)
Eos: 4 %
Hematocrit: 32.8 % — ABNORMAL LOW (ref 37.5–51.0)
Hemoglobin: 10.8 g/dL — ABNORMAL LOW (ref 13.0–17.7)
Immature Grans (Abs): 0 10*3/uL (ref 0.0–0.1)
Immature Granulocytes: 0 %
Lymphocytes Absolute: 0.3 10*3/uL — ABNORMAL LOW (ref 0.7–3.1)
Lymphs: 6 %
MCH: 31.8 pg (ref 26.6–33.0)
MCHC: 32.9 g/dL (ref 31.5–35.7)
MCV: 97 fL (ref 79–97)
Monocytes Absolute: 0.5 10*3/uL (ref 0.1–0.9)
Monocytes: 9 %
Neutrophils Absolute: 4.4 10*3/uL (ref 1.4–7.0)
Neutrophils: 80 %
Platelets: 133 10*3/uL — ABNORMAL LOW (ref 150–450)
RBC: 3.4 x10E6/uL — ABNORMAL LOW (ref 4.14–5.80)
RDW: 14 % (ref 11.6–15.4)
WBC: 5.6 10*3/uL (ref 3.4–10.8)

## 2019-02-06 LAB — MICROALBUMIN / CREATININE URINE RATIO
Creatinine, Urine: 140.1 mg/dL
Microalb/Creat Ratio: 3 mg/g creat (ref 0–29)
Microalbumin, Urine: 3.7 ug/mL

## 2019-02-06 LAB — TSH: TSH: 3 u[IU]/mL (ref 0.450–4.500)

## 2019-02-06 NOTE — Patient Instructions (Addendum)
HEALTH MAINTENANCE RECOMMENDATIONS:  It is recommended that you get at least 30 minutes of aerobic exercise at least 5 days/week (for weight loss, you may need as much as 60-90 minutes). This can be any activity that gets your heart rate up. This can be divided in 10-15 minute intervals if needed, but try and build up your endurance at least once a week.  Weight bearing exercise is also recommended twice weekly.  Eat a healthy diet with lots of vegetables, fruits and fiber.  "Colorful" foods have a lot of vitamins (ie green vegetables, tomatoes, red peppers, etc).  Limit sweet tea, regular sodas and alcoholic beverages, all of which has a lot of calories and sugar.  Up to 2 alcoholic drinks daily may be beneficial for men (unless trying to lose weight, watch sugars).  Drink a lot of water.  Sunscreen of at least SPF 30 should be used on all sun-exposed parts of the skin when outside between the hours of 10 am and 4 pm (not just when at beach or pool, but even with exercise, golf, tennis, and yard work!)  Use a sunscreen that says "broad spectrum" so it covers both UVA and UVB rays, and make sure to reapply every 1-2 hours.  Remember to change the batteries in your smoke detectors when changing your clock times in the spring and fall. Carbon monoxide detectors are recommended for your home.  Use your seat belt every time you are in a car, and please drive safely and not be distracted with cell phones and texting while driving.    Mr. Robledo , Thank you for taking time to come for your Medicare Wellness Visit. I appreciate your ongoing commitment to your health goals. Please review the following plan we discussed and let me know if I can assist you in the future.   This is a list of the screening recommended for you and due dates:  Health Maintenance  Topic Date Due   Tetanus Vaccine  07/26/2018   Flu Shot  Fall 2021   Complete foot exam   02/03/2019   Eye exam for diabetics  05/09/2019     Hemoglobin A1C  08/06/2019   Colon Cancer Screening  05/14/2020    Hepatitis C: One time screening is recommended by Center for Disease Control  (CDC) for  adults born from [birth year] through 1964.   Completed   Pneumonia vaccines  Completed   Your foot exam was done at today's visit. You are due to get a tetanus booster.  You need to get this from the pharmacy, as it is covered by Medicare Part D. I recommend getting TdaP vacine.  I recommend getting the new shingles vaccine (Shingrix). You will need to get this from the pharmacy rather than our office, as it is covered by Medicare Part D.  It is a series of 2 injections, spaced 2 months apart.  Increase to 18 units of insulin. Periodically check your sugars at bedtime also. You can further increase to 20 units after a week if your morning sugars remain over 130. If you start having sugars below 80, let us know.  Start taking a multivitamin daily, and also a separate B12 (1026mcg, which is the same as 1mg , once daily)  Increase your exercise to 30 minutes daily--try getting 10-15 minutes at a time of indoor, seated activity, as we discussed (to help prevent knee pain, but still give you good cardio).  Your feet/legs are more swollen today. Your blood pressure was  also up (goal is 130/80 or less. Be sure to limit the sodium in your diet.  Avoid canned foods and foods that contain a lot of sodium (read labels). See information below.  Try to keep your legs elevated when sitting for a long time. Getting regular exercise helps keep the blood flowing back up to the heart.   Low-Sodium Eating Plan Sodium, which is an element that makes up salt, helps you maintain a healthy balance of fluids in your body. Too much sodium can increase your blood pressure and cause fluid and waste to be held in your body. Your health care provider or dietitian may recommend following this plan if you have high blood pressure (hypertension), kidney disease, liver  disease, or heart failure. Eating less sodium can help lower your blood pressure, reduce swelling, and protect your heart, liver, and kidneys. What are tips for following this plan? General guidelines  Most people on this plan should limit their sodium intake to 1,500-2,000 mg (milligrams) of sodium each day. Reading food labels   The Nutrition Facts label lists the amount of sodium in one serving of the food. If you eat more than one serving, you must multiply the listed amount of sodium by the number of servings.  Choose foods with less than 140 mg of sodium per serving.  Avoid foods with 300 mg of sodium or more per serving. Shopping  Look for lower-sodium products, often labeled as "low-sodium" or "no salt added."  Always check the sodium content even if foods are labeled as "unsalted" or "no salt added".  Buy fresh foods. ? Avoid canned foods and premade or frozen meals. ? Avoid canned, cured, or processed meats  Buy breads that have less than 80 mg of sodium per slice. Cooking  Eat more home-cooked food and less restaurant, buffet, and fast food.  Avoid adding salt when cooking. Use salt-free seasonings or herbs instead of table salt or sea salt. Check with your health care provider or pharmacist before using salt substitutes.  Cook with plant-based oils, such as canola, sunflower, or olive oil. Meal planning  When eating at a restaurant, ask that your food be prepared with less salt or no salt, if possible.  Avoid foods that contain MSG (monosodium glutamate). MSG is sometimes added to [place] food, bouillon, and some canned foods. What foods are recommended? The items listed may not be a complete list. Talk with your dietitian about what dietary choices are best for you. Grains Low-sodium cereals, including oats, puffed wheat and rice, and shredded wheat. Low-sodium crackers. Unsalted rice. Unsalted pasta. Low-sodium bread. Whole-grain breads and whole-grain  pasta. Vegetables Fresh or frozen vegetables. "No salt added" canned vegetables. "No salt added" tomato sauce and paste. Low-sodium or reduced-sodium tomato and vegetable juice. Fruits Fresh, frozen, or canned fruit. Fruit juice. Meats and other protein foods Fresh or frozen (no salt added) meat, poultry, seafood, and fish. Low-sodium canned tuna and salmon. Unsalted nuts. Dried peas, beans, and lentils without added salt. Unsalted canned beans. Eggs. Unsalted nut butters. Dairy Milk. Soy milk. Cheese that is naturally low in sodium, such as ricotta cheese, fresh mozzarella, or Swiss cheese Low-sodium or reduced-sodium cheese. Cream cheese. Yogurt. Fats and oils Unsalted butter. Unsalted margarine with no trans fat. Vegetable oils such as canola or olive oils. Seasonings and other foods Fresh and dried herbs and spices. Salt-free seasonings. Low-sodium mustard and ketchup. Sodium-free salad dressing. Sodium-free light mayonnaise. Fresh or refrigerated horseradish. Lemon juice. Vinegar. Homemade, reduced-sodium, or low-sodium  soups. Unsalted popcorn and pretzels. Low-salt or salt-free chips. What foods are not recommended? The items listed may not be a complete list. Talk with your dietitian about what dietary choices are best for you. Grains Instant hot cereals. Bread stuffing, pancake, and biscuit mixes. Croutons. Seasoned rice or pasta mixes. Noodle soup cups. Boxed or frozen macaroni and cheese. Regular salted crackers. Self-rising flour. Vegetables Sauerkraut, pickled vegetables, and relishes. Olives. [place] fries. Onion rings. Regular canned vegetables (not low-sodium or reduced-sodium). Regular canned tomato sauce and paste (not low-sodium or reduced-sodium). Regular tomato and vegetable juice (not low-sodium or reduced-sodium). Frozen vegetables in sauces. Meats and other protein foods Meat or fish that is salted, canned, smoked, spiced, or pickled. Bacon, ham, sausage, hotdogs, corned  beef, chipped beef, packaged lunch meats, salt pork, jerky, pickled herring, anchovies, regular canned tuna, sardines, salted nuts. Dairy Processed cheese and cheese spreads. Cheese curds. Blue cheese. Feta cheese. String cheese. Regular cottage cheese. Buttermilk. Canned milk. Fats and oils Salted butter. Regular margarine. Ghee. Bacon fat. Seasonings and other foods Onion salt, garlic salt, seasoned salt, table salt, and sea salt. Canned and packaged gravies. Worcestershire sauce. Tartar sauce. Barbecue sauce. Teriyaki sauce. Soy sauce, including reduced-sodium. Steak sauce. Fish sauce. Oyster sauce. Cocktail sauce. Horseradish that you find on the shelf. Regular ketchup and mustard. Meat flavorings and tenderizers. Bouillon cubes. Hot sauce and Tabasco sauce. Premade or packaged marinades. Premade or packaged taco seasonings. Relishes. Regular salad dressings. Salsa. Potato and tortilla chips. Corn chips and puffs. Salted popcorn and pretzels. Canned or dried soups. Pizza. Frozen entrees and pot pies. Summary  Eating less sodium can help lower your blood pressure, reduce swelling, and protect your heart, liver, and kidneys.  Most people on this plan should limit their sodium intake to 1,500-2,000 mg (milligrams) of sodium each day.  Canned, boxed, and frozen foods are high in sodium. Restaurant foods, fast foods, and pizza are also very high in sodium. You also get sodium by adding salt to food.  Try to cook at home, eat more fresh fruits and vegetables, and eat less fast food, canned, processed, or prepared foods. This information is not intended to replace advice given to you by your health care provider. Make sure you discuss any questions you have with your health care provider. Document Released: 08/07/2001 Document Revised: 01/28/2017 Document Reviewed: 02/09/2016 Elsevier Patient Education  2020 [employer].

## 2019-02-06 NOTE — Progress Notes (Signed)
Chief Complaint  Patient presents with  . Medicare Wellness    fasting annual wellness, labs were done already. No new concerns. James Glover started him on 40mg  omeprazole for GERD.     James Glover is a 73 y.o. male who presents for annual physical exam, Medicare wellness visit and follow-up on chronic medical conditions.  He had labs done prior to his visit. He has the following concerns:  Diabetes:  His sugars went up during radiation treatments for his prostate cancer.  A1c went up to 9.2.  He was started on Tresiba, and dose was titrated up.  He was at 34 U when seen a month ago. Sugars have been running 108-165, usually 130-135 on average.  Mostly only checking in the mornings.  Denies hypoglycemia, polydipsia; + polyuria/urinary urgency (related to radiation). Denies polydipsia. Denies numbness, tingling, lesions/sores, and checks his feet regularly. Last diabetic eye exam was in 04/2018, no retinopathy  Still has right knee pain, s/p knee surgery. Only hurts with walking/standing long times.  At his last visit, he was walking a full mile almost daily, but recently only walking 1/2 mile.  Nog going to the gyms since [place].  Prostate cancer: diagnosed in 04/2018.  He is s/p radiation treatments. He continues to get Lupron injections, thinks he is due.  He has an appointment later this month.  Hypertension:  BP's are running 130/60's.  He denies headaches, dizziness, chest pain or side effects from medications.  Hyperlipidemia: he is compliant with taking simvastatin, denies side effects. Lipids were at goal on last check. Lab Results  Component Value Date   CHOL 142 08/14/2018   HDL 53 08/14/2018   LDLCALC 79 08/14/2018   TRIG 48 08/14/2018   CHOLHDL 2.7 08/14/2018   CKD, stage 3: under the care of nephrologist, goes yearly.  GERD--James Glover started him on omeprazole 40mg  daily for GERD, about 6 weeks ago. He was coughing a lot and hearburn at night.  He takes the medication  in the morning, and he is doing much better (90%).  Worse with spicy foods, chocolate  Immunization History  Administered Date(s) Administered  . DTaP 07/25/2008  . Influenza Split 11/17/2011, 12/30/2013, 12/26/2014  . Influenza, High Dose Seasonal PF 01/30/2013, 11/12/2015, 12/08/2016, 01/16/2018, 12/25/2018  . Pneumococcal Conjugate-13 03/04/2014  . Pneumococcal Polysaccharide-23 11/29/2009, 04/09/2015  . Tdap 07/25/2008  . Zoster 09/05/2013   Last colonoscopy: 05/05/10  Last PSA:now being monitored by urologist Dentist: goes 2-3 times per year.  Ophtho: yearly, due this month Exercise:  Walk the track, weather-permitting, [id number], 1/2 mile (limited by knee pain).  Hasn't been doing indoor exercises.  Same distance/frequency at the beach. Exercise is limited by his knee; can't exercise in morning related to bowels.  Other doctors caring for patient include: Dentist: James Glover Ophtho: James Glover ([place] ophtho) GI: James Glover: [place] Cardiologist: James Glover Dermatologist: [facility]: James Glover December (also saw James Glover for knee pain, at [facility]) Urologist: James Glover Radiation onc: James Glover  Depression screen: Negative Fall screen: negative Functional Status Screen: some urinary urgency (and sometimes leakage); change to bowels--urgency/frequency just in the mornings, since radiation. Mini-Cog screen: normal  End of Life Discussion: Patient does not havea living will and medical power of attorney. He has been told about this the last few years, but still hasn't done. "It's morbid to think about" is what he has said in the past.  He was given new forms last year.  He  now understands this is important and should be done, asking for new forms.  PMH, PSH, SH and FH reviewed and updated.  Outpatient Encounter Medications as of 02/07/2019  Medication Sig Note  . aspirin 81 MG tablet Take 81 mg by mouth daily.     .  clotrimazole (LOTRIMIN) 1 % cream Apply 1 application topically as needed.   Marland Kitchen glucose blood test strip Use as instructed   . hydrochlorothiazide (HYDRODIURIL) 25 MG tablet TAKE 1 TABLET(25 MG) BY MOUTH DAILY (Patient taking differently: Take 25 mg by mouth daily. TAKE 1 TABLET(25 MG) BY MOUTH DAILY)   . insulin degludec (TRESIBA FLEXTOUCH) 100 UNIT/ML SOPN FlexTouch Pen Inject 0.1 mLs (10 Units total) into the skin daily. (Patient taking differently: Inject 16 Units into the skin daily. )   . Insulin Pen Needle (NOVOFINE PLUS) 32G X 4 MM MISC 1 each by Does not apply route daily.   Marland Kitchen losartan (COZAAR) 50 MG tablet Take one daily   . Omega-3 Fatty Acids (FISH OIL) 1000 MG CAPS Take 1 capsule by mouth daily.    Marland Kitchen omeprazole (PRILOSEC) 40 MG capsule TK 1 C PO 20 MIN B BRE   . ONE TOUCH ULTRA TEST test strip USE AS DIRECTED   . ONETOUCH DELICA LANCETS FINE MISC U UTD 04/09/2015: Received from: External Pharmacy  . pioglitazone (ACTOS) 45 MG tablet TAKE 1 TABLET(45 MG) BY MOUTH DAILY (Patient taking differently: Take 45 mg by mouth. TAKE 1 TABLET(45 MG) BY MOUTH DAILY)   . saxagliptin HCl (ONGLYZA) 2.5 MG TABS tablet TAKE 1 TABLET(2.5 MG) BY MOUTH DAILY (Patient taking differently: Take 2.5 mg by mouth daily. TAKE 1 TABLET(2.5 MG) BY MOUTH DAILY)   . simvastatin (ZOCOR) 20 MG tablet TAKE 1 TABLET(20 MG) BY MOUTH AT BEDTIME (Patient taking differently: Take 20 mg by mouth at bedtime. TAKE 1 TABLET(20 MG) BY MOUTH AT BEDTIME)   . tamsulosin (FLOMAX) 0.4 MG CAPS capsule Take 1 capsule (0.4 mg total) by mouth daily as needed. For urinary urgency or weak stream.   . sildenafil (REVATIO) 20 MG tablet Take 2-5 tablets once daily as needed for erectile dysfunction (Patient not taking: Reported on 12/27/2018)   . valACYclovir (VALTREX) 1000 MG tablet Take 2,000 mg by mouth as needed (fever blister). Reported on 04/09/2015 10/10/2014: From her dermatologist [facility]); prn fever blisters   No facility-administered  encounter medications on file as of 02/07/2019.    Allergies  Allergen Reactions  . Ace Inhibitors Cough     ROS: The patient denies anorexia, fever, headaches, vision loss, decreased hearing, ear pain, hoarseness, chest pain, palpitations, dizziness, syncope, dyspnea on exertion, cough, swelling, nausea, vomiting, diarrhea, constipation, abdominal pain, melena, hematochezia, hematuria, incontinence, weakened urine stream, dysuria, genital lesions, joint pains, numbness, tingling, weakness, tremor, depression, anxiety, abnormal bleeding/bruising, or enlarged lymph nodes.  Up 3-4 times/night to void, and every 2-3 hours during the day. No urinary hesitancy. +ED, as per HPI +snoring per wife. Wakes up feeling refreshed, no daytime somnolence. Is aware that he would wake up startled/snoring when laying on his back, but he no longer sleeps on his back. Has some mild hay fever/allergies  Occasional heartburn at night, especially after fried foods.     PHYSICAL EXAM:  BP (!) 150/70   Pulse 84   Temp (!) 97.5 F (36.4 C) (Tympanic)   Ht 6\' 3"  (1.905 m)   Wt 279 lb 12.8 oz (126.9 kg)   BMI 34.97 kg/m   154/50 on repeat by  MD, RA, down to 144 on recheck by nurse  Wt Readings from Last 3 Encounters:  02/07/19 279 lb 12.8 oz (126.9 kg)  01/11/19 275 lb (124.7 kg)  12/27/18 274 lb (124.3 kg)    General Appearance:  Alert, cooperative, no distress, appears stated age   Head:  Normocephalic, without obvious abnormality, atraumatic   Eyes:  PERRL, conjunctiva/corneas clear, EOM's intact, fundi benign   Ears:  Normal TM's and external ear canals  Nose:  Not examined, wearing mask due to COVID-19 pandemic   Throat:  Not examined, wearing mask due to COVID-19 pandemic  Neck:  Supple, no lymphadenopathy; thyroid: no enlargement/ tenderness/nodules; no carotid bruit or JVD   Back:  Spine nontender, no curvature, ROM normal, no CVA tenderness   Lungs:  Clear to  auscultation bilaterally without wheezes, rales or ronchi; respirations unlabored   Chest Wall:  No tenderness or deformity   Heart:  Regular rate and rhythm, S1 and S2 normal, no murmur, rub or gallop   Breast Exam:  No chest wall tenderness, masses or gynecomastia   Abdomen:  Soft, non-tender, nondistended, normoactive bowel sounds, no masses, no hepatosplenomegaly. Mild diastasis recti, nontender  Genitalia:  Deferred to urologist   Rectal:  Deferred to urologist  Extremities:  No clubbing, cyanosis. 1+ pretibial pitting edema.  Also has some STS on top of R foot with ecchymosis at base of 1st and 2nd toes (recently dropped book on his foot).  Pulses:  2+ and symmetric all extremities   Skin:  Skin is dry/flaky, especially on lower legs; no rashes or lesions. Normal diabetic foot exam. Sebaceous cyst at upper back no longer has black central pore, is now a hole.  No induration, erythema or tenderness  Lymph nodes:  Cervical, supraclavicular, and axillary nodes normal   Neurologic:  CNII-XII intact, normal strength, sensation and gait; reflexes 2+ and symmetric throughout   Psych: Normal mood, affect, hygiene and grooming       Diabetic foot exam--normal sensation to monofilament. No skin lesions, just dry. Some discolored toenails.   Lab Results  Component Value Date   HGBA1C 8.5 (H) 02/05/2019  (done too early, as was ordered back in June--has only been on insulin for 2 months, with very high sugars the month prior)    Chemistry      Component Value Date/Time   NA 138 02/05/2019 0920   K 4.4 02/05/2019 0920   CL 102 02/05/2019 0920   CO2 24 02/05/2019 0920   BUN 27 02/05/2019 0920   CREATININE 2.05 (H) 02/05/2019 0920   CREATININE 1.73 (H) 02/02/2017 1205      Component Value Date/Time   CALCIUM 8.7 02/05/2019 0920   ALKPHOS 46 02/05/2019 0920   AST 16 02/05/2019 0920   ALT 18 02/05/2019 0920   BILITOT 0.3  02/05/2019 0920     Fasting glucose 123  Urine microalbumin/Cr ratio 3  Lab Results  Component Value Date   WBC 5.6 02/05/2019   HGB 10.8 (L) 02/05/2019   HCT 32.8 (L) 02/05/2019   MCV 97 02/05/2019   PLT 133 (L) 02/05/2019   Lab Results  Component Value Date   TSH 3.000 02/05/2019    ASSESSMENT/PLAN:  Annual physical exam  Medicare annual wellness visit, subsequent  Uncontrolled type 2 diabetes mellitus with hyperglycemia (HCC) - sugars improving, still a little above goal. Titrate up insulin to 18 units (and possibly up to 20, if needed)  Type 2 diabetes with nephropathy ([place]) - Plan: pioglitazone (  ACTOS) 45 MG tablet, saxagliptin HCl (ONGLYZA) 2.5 MG TABS tablet  Prostate cancer ([place]) - doing well, s/p radiation. Has f/u later this month  Stage 3 chronic kidney disease, unspecified whether stage 3a or 3b CKD - stable creatinine  Essential hypertension, benign - elevated today.  Eating more soups. Reviewed low sodium diet, need for daily exercise, monitor BP - Plan: hydrochlorothiazide (HYDRODIURIL) 25 MG tablet, losartan (COZAAR) 50 MG tablet  Type 2 diabetes with nephropathy (HCC) - A1c above goal, at 7.2%. Morning sugars are elevated.  Increase glipizide with dinner to full tablet, cont 1/2 tab in morning. Diet/exercise counseling given - Plan: pioglitazone (ACTOS) 45 MG tablet, saxagliptin HCl (ONGLYZA) 2.5 MG TABS tablet  Dyslipidemia - at goal per last check, continue current meds - Plan: simvastatin (ZOCOR) 20 MG tablet  Peripheral edema - discussed poss causes (sodium intake, less active, SE of pioglitazone). Disc low Na diet, exercise, leg elevation  Increase to 18 units of insulin. Periodically check your sugars at bedtime also. You can further increase to 20 units after a week if your morning sugars remain over 130. If you start having sugars below 80, let us know.  Start taking a multivitamin daily, and also a separate B12 (101mcg, which is the same as  1mg , once daily)  Recommended at least 30 minutes of aerobic activity at least 5 days/week, weight-bearing exercise at least 2x/week; proper sunscreen use reviewed; healthy diet and alcohol recommendations (less than or equal to 2 drinks/day) reviewed; regular seatbelt use; changing batteries in smoke detectors. Immunization recommendations discussed--Continue yearly flu shots. Shingrix recommended, to get at pharmacy;risks/side effects reviewed.Given written rx to get Tdap from pharmacy. Colonoscopy recommendations reviewed, UTD  MOST form updated and reviewed. Full Code, Full care  Reviewed living will and healthcare power of attorney info again. Encouraged to fill out forms (given another set); counseled extensively re: reasons to discuss with wife, and that she should also fill out.    F/u 3 months for med check (A1c at visit).    Medicare Attestation I have personally reviewed: The patient's medical and social history Their use of alcohol, tobacco or illicit drugs Their current medications and supplements The patient's functional ability including ADLs,fall risks, home safety risks, cognitive, and hearing and visual impairment Diet and physical activities Evidence for depression or mood disorders  The patient's weight, height, BMI have been recorded in the chart.  I have made referrals, counseling, and provided education to the patient based on review of the above and I have provided the patient with a written personalized care plan for preventive services.

## 2019-02-07 ENCOUNTER — Ambulatory Visit: Payer: Medicare Other | Admitting: Family Medicine

## 2019-02-07 ENCOUNTER — Encounter: Payer: Self-pay | Admitting: Family Medicine

## 2019-02-07 ENCOUNTER — Other Ambulatory Visit: Payer: Self-pay

## 2019-02-07 VITALS — BP 144/50 | HR 84 | Temp 97.5°F | Ht 75.0 in | Wt 279.8 lb

## 2019-02-07 DIAGNOSIS — Z0000 Encounter for general adult medical examination without abnormal findings: Secondary | ICD-10-CM | POA: Diagnosis not present

## 2019-02-07 DIAGNOSIS — R609 Edema, unspecified: Secondary | ICD-10-CM

## 2019-02-07 DIAGNOSIS — E785 Hyperlipidemia, unspecified: Secondary | ICD-10-CM

## 2019-02-07 DIAGNOSIS — E1165 Type 2 diabetes mellitus with hyperglycemia: Secondary | ICD-10-CM | POA: Diagnosis not present

## 2019-02-07 DIAGNOSIS — N183 Chronic kidney disease, stage 3 unspecified: Secondary | ICD-10-CM | POA: Diagnosis not present

## 2019-02-07 DIAGNOSIS — I10 Essential (primary) hypertension: Secondary | ICD-10-CM

## 2019-02-07 DIAGNOSIS — R600 Localized edema: Secondary | ICD-10-CM

## 2019-02-07 DIAGNOSIS — E1121 Type 2 diabetes mellitus with diabetic nephropathy: Secondary | ICD-10-CM | POA: Diagnosis not present

## 2019-02-07 DIAGNOSIS — C61 Malignant neoplasm of prostate: Secondary | ICD-10-CM

## 2019-02-07 MED ORDER — PIOGLITAZONE HCL 45 MG PO TABS: 45.0000 mg | ORAL_TABLET | Freq: Every day | ORAL | 5 refills | Status: DC

## 2019-02-07 MED ORDER — LOSARTAN POTASSIUM 50 MG PO TABS: ORAL_TABLET | ORAL | 5 refills | Status: DC

## 2019-02-07 MED ORDER — ONGLYZA 2.5 MG PO TABS: ORAL_TABLET | ORAL | 5 refills | Status: DC

## 2019-02-07 MED ORDER — HYDROCHLOROTHIAZIDE 25 MG PO TABS: 25.0000 mg | ORAL_TABLET | Freq: Every day | ORAL | 5 refills | Status: DC

## 2019-02-07 MED ORDER — SIMVASTATIN 20 MG PO TABS: ORAL_TABLET | ORAL | 5 refills | Status: DC

## 2019-02-08 ENCOUNTER — Encounter: Payer: Self-pay | Admitting: Urology

## 2019-02-08 ENCOUNTER — Other Ambulatory Visit: Payer: Self-pay | Admitting: Urology

## 2019-02-08 ENCOUNTER — Ambulatory Visit
Admission: RE | Admit: 2019-02-08 | Discharge: 2019-02-08 | Disposition: A | Discharge status: Home / Self Care | Payer: Medicare Other | Source: Ambulatory Visit | Attending: Urology | Admitting: Urology

## 2019-02-08 DIAGNOSIS — C61 Malignant neoplasm of prostate: Secondary | ICD-10-CM

## 2019-02-08 MED ORDER — TAMSULOSIN HCL 0.4 MG PO CAPS: 0.4000 mg | ORAL_CAPSULE | Freq: Two times a day (BID) | ORAL | 5 refills | Status: AC

## 2019-02-08 NOTE — Patient Instructions (Signed)
Coronavirus (COVID-19) Are you at risk?  Are you at risk for the Coronavirus (COVID-19)?  To be considered HIGH RISK for Coronavirus (COVID-19), you have to meet the following criteria:  . Traveled to [place], [place], [place], [place] or [place]; or in the [place] to [place], [place], [place], or [place]; and have fever, cough, and shortness of breath within the last 2 weeks of travel OR . Been in close contact with a person diagnosed with COVID-19 within the last 2 weeks and have fever, cough, and shortness of breath . IF YOU DO NOT MEET THESE CRITERIA, YOU ARE CONSIDERED LOW RISK FOR COVID-19.  What to do if you are HIGH RISK for COVID-19?  . If you are having a medical emergency, call 911. . Seek medical care right away. Before you go to a doctor's office, urgent care or emergency department, call ahead and tell them about your recent travel, contact with someone diagnosed with COVID-19, and your symptoms. You should receive instructions from your physician's office regarding next steps of care.  . When you arrive at healthcare provider, tell the healthcare staff immediately you have returned from visiting [place], [place], [place], [place] or [place]; or traveled in the [place] to [place], [place], [place], or [place]; in the last two weeks or you have been in close contact with a person diagnosed with COVID-19 in the last 2 weeks.   . Tell the health care staff about your symptoms: fever, cough and shortness of breath. . After you have been seen by a medical provider, you will be either: o Tested for (COVID-19) and discharged home on quarantine except to seek medical care if symptoms worsen, and asked to  - Stay home and avoid contact with others until you get your results (4-5 days)  - Avoid travel on public transportation if possible (such as bus, train, or airplane) or o Sent to the Emergency Department by EMS for evaluation, COVID-19 testing, and possible  admission depending on your condition and test results.  What to do if you are LOW RISK for COVID-19?  Reduce your risk of any infection by using the same precautions used for avoiding the common cold or flu:  . Wash your hands often with soap and warm water for at least 20 seconds.  If soap and water are not readily available, use an alcohol-based hand sanitizer with at least 60% alcohol.  . If coughing or sneezing, cover your mouth and nose by coughing or sneezing into the elbow areas of your shirt or coat, into a tissue or into your sleeve (not your hands). . Avoid shaking hands with others and consider head nods or verbal greetings only. . Avoid touching your eyes, nose, or mouth with unwashed hands.  . Avoid close contact with people who are sick. . Avoid places or events with large numbers of people in one location, like concerts or sporting events. . Carefully consider travel plans you have or are making. . If you are planning any travel outside or inside the [place], visit the CDC's Travelers' Health webpage for the latest health notices. . If you have some symptoms but not all symptoms, continue to monitor at home and seek medical attention if your symptoms worsen. . If you are having a medical emergency, call 911.   ADDITIONAL HEALTHCARE OPTIONS FOR PATIENTS  [facility] / e-Visit: [email or web address]         [facility]: [phone]  [facility]: [phone]                   [facility]: [phone]   

## 2019-02-08 NOTE — Progress Notes (Signed)
Spoke with patient via phone. Denies any issues or complaints of AUA score is 9.

## 2019-02-08 NOTE — Progress Notes (Signed)
Radiation Oncology         [phone] ________________________________  Name: James Glover MRN: [id number]  Date: 02/08/2019  DOB: [birth year]  Post Treatment Note  CC: [name], MD  [name], MD  Diagnosis:   73 y.o. gentleman with Stage T1c adenocarcinoma of the prostate with Gleason score of 4+5 and PSA of 4.77  Interval Since Last Radiation:  4 weeks  12/04/18 - 01/05/19: The prostate and pelvic nodes were treated to 45 Gy in 25 fractions of 1.8 Gy, to supplement an up-front prostate seed implant boost of 110 Gy to achieve a total nominal dose of 165 Gy; concurrent with LT-ADT.  11/03/18:  Insertion of radioactive I-125 seeds into the prostate gland; 110 Gy, boost therapy and SpaceOAR gel placement.  Narrative:  I spoke with the patient to conduct his routine scheduled 1 month follow up visit via telephone to spare the patient unnecessary potential exposure in the healthcare setting during the current COVID-19 pandemic.  The patient was notified in advance and gave permission to proceed with this visit format. He tolerated the radiation treatments relatively well with moderate urinary irritation and modest fatigue.  He did experience some intermittent episodes of loose stools but did not require medicaton to control.  He had some occasional mild dysuria and weakened flow of stream but reported that he was able to empty his bladder well throughout treatment.  He continued to tolerated the ADT well despite occasional hot flashes and fatigue.                         On review of systems, the patient states that he is doing very well overall.  He has appreciated a gradual improvement in his LUTS, particularly the urgency component, but continues with increased frequency, both day and night with nocturia 8-10 times per night.  He reports a moderate flow of stream and denies intermittency or straining to empty his bladder and has continued taking Flomax each morning after breakfast.  He does  feel that he empties his bladder well on voiding and denies any incontinence. He specifically denies dysuria, gross hematuria, fever, chills or night sweats.  He continues with occasional hot flashes and mild fatigue but feels that he tolerates the ADT well in general.  ALLERGIES:  is allergic to ace inhibitors.  Meds: Current Outpatient Medications  Medication Sig Dispense Refill  . aspirin 81 MG tablet Take 81 mg by mouth daily.      . clotrimazole (LOTRIMIN) 1 % cream Apply 1 application topically as needed.    Marland Kitchen glucose blood test strip Use as instructed 100 each 3  . hydrochlorothiazide (HYDRODIURIL) 25 MG tablet Take 1 tablet (25 mg total) by mouth daily. TAKE 1 TABLET(25 MG) BY MOUTH DAILY 30 tablet 5  . insulin degludec (TRESIBA FLEXTOUCH) 100 UNIT/ML SOPN FlexTouch Pen Inject 0.1 mLs (10 Units total) into the skin daily. (Patient taking differently: Inject 16 Units into the skin daily. ) 3 mL 1  . Insulin Pen Needle (NOVOFINE PLUS) 32G X 4 MM MISC 1 each by Does not apply route daily. 100 each 0  . losartan (COZAAR) 50 MG tablet Take one daily 30 tablet 5  . Omega-3 Fatty Acids (FISH OIL) 1000 MG CAPS Take 1 capsule by mouth daily.     Marland Kitchen omeprazole (PRILOSEC) 40 MG capsule TK 1 C PO 20 MIN B BRE    . ONE TOUCH ULTRA TEST test strip USE AS  DIRECTED 100 each 3  . ONETOUCH DELICA LANCETS FINE MISC U UTD  0  . pioglitazone (ACTOS) 45 MG tablet Take 1 tablet (45 mg total) by mouth daily. 30 tablet 5  . saxagliptin HCl (ONGLYZA) 2.5 MG TABS tablet TAKE 1 TABLET(2.5 MG) BY MOUTH DAILY 30 tablet 5  . sildenafil (REVATIO) 20 MG tablet Take 2-5 tablets once daily as needed for erectile dysfunction 100 tablet 0  . simvastatin (ZOCOR) 20 MG tablet TAKE 1 TABLET(20 MG) BY MOUTH AT BEDTIME 30 tablet 5  . valACYclovir (VALTREX) 1000 MG tablet Take 2,000 mg by mouth as needed (fever blister). Reported on 04/09/2015    . tamsulosin (FLOMAX) 0.4 MG CAPS capsule Take 1 capsule (0.4 mg total) by mouth 2  (two) times daily after a meal. For urinary urgency or weak stream. 60 capsule 5   No current facility-administered medications for this encounter.    Physical Findings:  vitals were not taken for this visit.  Pain Assessment Pain Score: 0-No pain/10 Unable to assess due to telephone follow-up visit format.  Lab Findings: Lab Results  Component Value Date   WBC 5.6 02/05/2019   HGB 10.8 (L) 02/05/2019   HCT 32.8 (L) 02/05/2019   MCV 97 02/05/2019   PLT 133 (L) 02/05/2019     Radiographic Findings: No results found.  Impression/Plan: 1. 73 y.o. gentleman with Stage T1c adenocarcinoma of the prostate with Gleason score of 4+5 and PSA of 4.77 He will continue to follow up with urology for ongoing PSA determinations and has an appointment scheduled with Dr. Tresa Moore on 02/21/19 for his next 6 month Lupron injection.  I have sent a prescription to his pharmacy for Flomax 0.4 mg to increase his dose to twice daily instead of once daily and see if this helps better manage his LUTS.  He understands what to expect with regards to PSA monitoring going forward. I will look forward to following his response to treatment via correspondence with urology, and would be happy to continue to participate in his care if clinically indicated. I talked to the patient about what to expect in the future, including his risk for erectile dysfunction and rectal bleeding. I encouraged him to call or return to the office if he has any questions regarding his previous radiation or possible radiation side effects. He was comfortable with this plan and will follow up as needed.    Nicholos Johns, PA-C

## 2019-02-09 NOTE — Progress Notes (Signed)
  Radiation Oncology         [phone] ________________________________  Name: James Glover MRN: [id number]  Date: 01/05/2019  DOB: [birth year]  End of Treatment Note  Diagnosis:   73 y.o. gentleman with Stage T1c adenocarcinoma of the prostate with Gleason score of 4+5 and PSA of 4.77     Indication for treatment:  Curative, Definitive Radiotherapy       Radiation treatment dates:    1. 11/03/2018 2. 12/04/2018 - 01/05/2019  Site/dose:  1. Radioactive seeds were implanted into the prostate for a total of 110 Gy. 2. The prostate, seminal vesicles, and pelvic lymph nodes were boosted with 45 Gy in 25 fractions of 1.8 Gy, for a total dose of 155 Gy  Beams/energy:  1. The radioactive seeds were delivered under sagittal guidance with 3D imaging. 2. The prostate, seminal vesicles, and pelvic lymph nodes were initially treated using helical intensity modulated radiotherapy delivering 6 megavolt photons. Image guidance was performed with megavoltage CT studies prior to each fraction. He was immobilized with a body fix lower extremity mold.   Narrative: The patient tolerated radiation treatment relatively well. He reported moderate fatigue, with occasional dysuria, urinary urgency with small volume leakage, weak stream that improved as treatments continued and nocturia x8-10. He denied hematuria, difficulty emptying his bladder, and constipation. He experienced nausea with stomach pain and an episode of vomiting, as well as some diarrhea that resolved on its own.  Plan: The patient has completed radiation treatment. He will return to radiation oncology clinic for routine followup in one month. I advised him to call or return sooner if he has any questions or concerns related to his recovery or treatment. ________________________________  Sheral Apley. Tammi Klippel, M.D.  This document serves as a record of services personally performed by Tyler Pita, MD. It was created on his behalf by Wilburn Mylar, a trained medical scribe. The creation of this record is based on the scribe's personal observations and the provider's statements to them. This document has been checked and approved by the attending provider.

## 2019-02-12 LAB — B12 AND FOLATE PANEL
Folate: 6.6 ng/mL (ref >3.0)
Vitamin B-12: 294 pg/mL (ref 232–1245)

## 2019-02-12 LAB — IRON: Iron: 59 ug/dL (ref 38–169)

## 2019-02-12 LAB — FERRITIN: Ferritin: 702 ng/mL — ABNORMAL HIGH (ref 30–400)

## 2019-02-12 LAB — SPECIMEN STATUS REPORT

## 2019-02-19 ENCOUNTER — Telehealth: Payer: Self-pay

## 2019-02-19 DIAGNOSIS — E1165 Type 2 diabetes mellitus with hyperglycemia: Secondary | ICD-10-CM

## 2019-02-19 MED ORDER — TRESIBA FLEXTOUCH 100 UNIT/ML ~~LOC~~ SOPN: 16.0000 [IU] | PEN_INJECTOR | Freq: Every day | SUBCUTANEOUS | 2 refills | Status: DC

## 2019-02-19 NOTE — Telephone Encounter (Signed)
Received fax from [facility] stating pt. Needs refill on his Tresiba flex touch pen last apt. 02/07/19

## 2019-02-19 NOTE — Telephone Encounter (Signed)
Done

## 2019-03-28 ENCOUNTER — Telehealth: Payer: Self-pay | Admitting: Family Medicine

## 2019-03-28 MED ORDER — GLUCOSE BLOOD VI STRP: ORAL_STRIP | 3 refills | Status: DC

## 2019-03-28 NOTE — Telephone Encounter (Signed)
Pt called and is requesting that his blood strips sent to the [facility] in [place], he can no longer get them at [facility] due to insurance pt can be reached at  [phone]

## 2019-03-28 NOTE — Telephone Encounter (Signed)
Done

## 2019-03-29 ENCOUNTER — Other Ambulatory Visit: Payer: Self-pay | Admitting: *Deleted

## 2019-03-29 ENCOUNTER — Encounter: Payer: Self-pay | Admitting: *Deleted

## 2019-03-29 MED ORDER — BLOOD GLUCOSE METER KIT: 1.0000 | PACK | 0 refills | Status: DC

## 2019-03-29 MED ORDER — ACCU-CHEK SOFTCLIX LANCETS MISC: 1.0000 | Freq: Two times a day (BID) | 5 refills | Status: DC

## 2019-03-29 MED ORDER — GLUCOSE BLOOD VI STRP: 1.0000 | ORAL_STRIP | 5 refills | Status: DC | PRN

## 2019-03-30 ENCOUNTER — Other Ambulatory Visit: Payer: Self-pay | Admitting: Family Medicine

## 2019-04-02 ENCOUNTER — Other Ambulatory Visit: Payer: Self-pay | Admitting: *Deleted

## 2019-04-03 ENCOUNTER — Other Ambulatory Visit: Payer: Self-pay | Admitting: Family Medicine

## 2019-04-03 DIAGNOSIS — E1165 Type 2 diabetes mellitus with hyperglycemia: Secondary | ICD-10-CM

## 2019-05-08 NOTE — Progress Notes (Signed)
Chief Complaint  Patient presents with  . Diabetes    nonfasting med check. Has diabetic eye exam scheduled with Dr. Satira Sark next Tuesday. Also scheduled for first COVID vaccine next Tuesday in [place]. Has had cough x 3 month, thinks it may be from one of his medications. His ankles are fine in the morning but by the evening they are very swollen.    Patient presents for 3 month f/u.  At his last visit, his DM wasn't at goal, and BP's were elevated.  He reports today that sometimes his throat gets very dry, and he starts coughing. Cough is nonproductive.  Feels like he has "a frog in his throat" and he can't cough it up.  He had similar cough 10 years ago, took a codeine syrup, and it helped.  Was fine for a few years, but it slowly came back, worse in the last 3 months. He is coughing when he lays down at night, but also coughs during the day.  Not necessarily related to eating, though he does report that his reflux symptoms in the past have manifested with coughing, as well as heartburn. He is having some sneezing, no runny nose or itchy eyes. He does report some dry mouth, and being a mouthbreather at night.  Diabetes:  Last A1c was 8.5.  Sugars had been up related to radiation treatments, and he had been started on Tresiba.  He was taking 16 U at his last visit, and we discussed how to titrate up.  He is currently taking onglyza, pioglitazone, and 18 U of Tresiba His sugars are currently running 87-134 (mostly 110-130), fasting.  Lab Results  Component Value Date   HGBA1C 8.5 (H) 02/05/2019   Denies hypoglycemia, polydipsia; +polyuria/urinary urgency (related to radiation), but this is improving (down from 10 to 5x/night).  Denies numbness, tingling, lesions/sores, and checks his feet regularly. Last diabetic eye exam was in 04/2018, no retinopathy. He is scheduled for a visit with Dr. Satira Sark next week. Walks 5-6 days/week, 3/4 - 1 mile (stops due to knee pain).  No longer going to the gym.  He used to get much more exercise when at the gym  (and could do things like the bike that didn't bother his knee as much as walking does).  Hypertension:  He reports compliance with losartan 50mg  and HCTZ 25mg .   BP's are running 130's/60's-70's at the pharmacy. He denies headaches, dizziness, chest pain or side effects from medications.  Prostate cancer: diagnosed in 04/2018.  He is s/p radiation treatments. He continues to get Lupron injections every 6 months, due again in June.  Had fewer side effects with the last shot.  GERD--Dr. Collene Mares started him on omeprazole 40mg  daily for GERD; symptoms had been coughing a lot and hearburn at night.  He takes the medication in the morning, and takes Pepcid AC at night, but he isn't sure if it is helping.  At his last visit he reported doing much better (90%), but not much better now. He hasn't seen her since last Fall. He has been drinking decaf tea, coffee in the mornings (at least 2 cups), has lemonade (with tea--Arnold Palmers). Rare wine (1-2x/week), symptoms are a little worse after wine. +spicy chili   PMH, PSH, SH reviewed  Outpatient Encounter Medications as of 05/09/2019  Medication Sig Note  . Accu-Chek Softclix Lancets lancets 1 each by Other route 2 (two) times daily. Use as instructed   . aspirin 81 MG tablet Take 81 mg by mouth  daily.     . clotrimazole (LOTRIMIN) 1 % cream Apply 1 application topically as needed.   Marland Kitchen glucose blood test strip 1 each by Other route as needed for other. Use as instructed   . hydrochlorothiazide (HYDRODIURIL) 25 MG tablet Take 1 tablet (25 mg total) by mouth daily. TAKE 1 TABLET(25 MG) BY MOUTH DAILY   . losartan (COZAAR) 50 MG tablet Take one daily   . NOVOFINE PLUS 32G X 4 MM MISC USE DAILY   . Omega-3 Fatty Acids (FISH OIL) 1000 MG CAPS Take 1 capsule by mouth daily.    Marland Kitchen omeprazole (PRILOSEC) 40 MG capsule TK 1 C PO 20 MIN B BRE   . pioglitazone (ACTOS) 45 MG tablet Take 1 tablet (45 mg total) by  mouth daily.   . saxagliptin HCl (ONGLYZA) 2.5 MG TABS tablet TAKE 1 TABLET(2.5 MG) BY MOUTH DAILY   . simvastatin (ZOCOR) 20 MG tablet TAKE 1 TABLET(20 MG) BY MOUTH AT BEDTIME   . tamsulosin (FLOMAX) 0.4 MG CAPS capsule Take 1 capsule (0.4 mg total) by mouth 2 (two) times daily after a meal. For urinary urgency or weak stream.   . TRESIBA FLEXTOUCH 100 UNIT/ML SOPN FlexTouch Pen INJECT 16 UNITS INTO THE SKIN DAILY (Patient taking differently: Inject 18 Units into the skin at bedtime. )   . sildenafil (REVATIO) 20 MG tablet Take 2-5 tablets once daily as needed for erectile dysfunction (Patient not taking: Reported on 05/09/2019)   . valACYclovir (VALTREX) 1000 MG tablet Take 2,000 mg by mouth as needed (fever blister). Reported on 04/09/2015 10/10/2014: From her dermatologist [facility]); prn fever blisters   No facility-administered encounter medications on file as of 05/09/2019.   Allergies  Allergen Reactions  . Ace Inhibitors Cough   ROS: no fever, chills, headaches, dizziness, chest pain, shortness of breath. Denies depression, nausea, vomiting, bowel changes, vision changes, bleeding, bruising, rash. +R knee pain, no other joint pains. No edema in the morning, some at the end of the day.  Cough per HPI.   PHYSICAL EXAM:  BP 140/70   Pulse 72   Temp (!) 96.9 F (36.1 C) (Oral)   Ht 6\' 3"  (1.905 m)   Wt 277 lb (125.6 kg)   BMI 34.62 kg/m   BP 170/70 on repeat when supine.  Wt Readings from Last 3 Encounters:  05/09/19 277 lb (125.6 kg)  02/07/19 279 lb 12.8 oz (126.9 kg)  01/11/19 275 lb (124.7 kg)   Well-appearing, pleasant male, in no distress.  Some hoarse voice, moreso than usual, improved after throat clearing.  Increased coughing after supine for exam. Needed to take frequent sips of water during the visit. HEENT: conjunctiva and sclera are clear, EOMI. Mouth is somewhat dry/tacky.  Nasal mucosa is mildly edematous, no purulence.  Sinuses nontender Neck: no  lymphadenopathy or mass Heart: regular rate and rhythm Lungs: clear, no wheezes, rales, ronchi Back: no CVA tenderness Abdomen: soft, nontender, no mass Extremities: trace pretibial edema.  Neuro: alert and oriented, normal gait Psych: normal mood, affect, normal eye contact, grooming.  Lab Results  Component Value Date   HGBA1C 7.2 (A) 05/09/2019    ASSESSMENT/PLAN:  Type 2 diabetes with nephropathy (HCC) - improved control; cont 18 U insulin and current oral meds. Increase exercise, weight loss rec - Plan: HgB A1c  Stage 3 chronic kidney disease, unspecified whether stage 3a or 3b CKD  Essential hypertension, benign - controlled per numbers at pharmacy, white coat component with MD. Cont current meds;  encouraged daily exercise, wt loss, low Na diet  Gastroesophageal reflux disease without esophagitis - may contribute to cough. change PPI to BID, reviewed diet, cut back on caffeine, citrus, wt loss.  - Plan: omeprazole (PRILOSEC) 40 MG capsule  Nonproductive cough - Ddx reviewed, likely has component of PND from allergies and GERD.  Start claritin, and increase PPI to BID. get CXR if not improving - Plan: DG Chest 2 View  Class 1 obesity due to excess calories with serious comorbidity and body mass index (BMI) of 34.0 to 34.9 in adult   COVID vaccine--has appt next week.  F/u 3 months (and sched CPE/AWV)

## 2019-05-09 ENCOUNTER — Encounter: Payer: Self-pay | Admitting: Family Medicine

## 2019-05-09 ENCOUNTER — Other Ambulatory Visit: Payer: Self-pay

## 2019-05-09 ENCOUNTER — Ambulatory Visit (INDEPENDENT_AMBULATORY_CARE_PROVIDER_SITE_OTHER): Payer: Medicare PPO | Admitting: Family Medicine

## 2019-05-09 VITALS — BP 140/70 | HR 72 | Temp 96.9°F | Ht 75.0 in | Wt 277.0 lb

## 2019-05-09 DIAGNOSIS — Z6834 Body mass index (BMI) 34.0-34.9, adult: Secondary | ICD-10-CM

## 2019-05-09 DIAGNOSIS — E1121 Type 2 diabetes mellitus with diabetic nephropathy: Secondary | ICD-10-CM | POA: Diagnosis not present

## 2019-05-09 DIAGNOSIS — E6609 Other obesity due to excess calories: Secondary | ICD-10-CM

## 2019-05-09 DIAGNOSIS — I10 Essential (primary) hypertension: Secondary | ICD-10-CM

## 2019-05-09 DIAGNOSIS — R058 Other specified cough: Secondary | ICD-10-CM

## 2019-05-09 DIAGNOSIS — N183 Chronic kidney disease, stage 3 unspecified: Secondary | ICD-10-CM

## 2019-05-09 DIAGNOSIS — K219 Gastro-esophageal reflux disease without esophagitis: Secondary | ICD-10-CM | POA: Diagnosis not present

## 2019-05-09 DIAGNOSIS — R05 Cough: Secondary | ICD-10-CM

## 2019-05-09 LAB — POCT GLYCOSYLATED HEMOGLOBIN (HGB A1C): Hemoglobin A1C: 7.2 % — AB (ref 4.0–5.6)

## 2019-05-09 MED ORDER — OMEPRAZOLE 40 MG PO CPDR: 40.0000 mg | DELAYED_RELEASE_CAPSULE | Freq: Two times a day (BID) | ORAL | 0 refills | Status: DC

## 2019-05-09 NOTE — Patient Instructions (Addendum)
Continue your current dose of insulin and medications.  Try and get non-weightbearing aerobic exercise (as shown in office--seated exercises, possibly with light hand weights to get your heart rate up).  You can do these for 10-15 minutes 2-3 times/day, and it might help you build back your endurance, and help with sugars and weight.  Limit your salt/sodium in your diet.  Try and elevate you legs when you are just sitting/relaxing.  Regular exercise helps keep the fluid out of the legs, as can wearing compression socks.   Continue to monitor your blood pressure regularly.  Goal is <130-135/80.  Be care to limit the sodium in your diet.  Cut back on the tea, lemonade, spicy foods, alcohol (see the handout for full list) in order to improve your reflux.  Losing weight may help. You can temporarily increase your omeprazole to twice daily (and stop the nighttime pepcid), and if after 2 weeks you aren't feeling better, you should follow-up with Dr. Collene Mares. If your cough doesn't respond to this higher dose of medication, then also go to [facility] (either [address]) to get a chest x-ray (no appointment needed, go between 8:30-4:30).    Food Choices for Gastroesophageal Reflux Disease, Adult When you have gastroesophageal reflux disease (GERD), the foods you eat and your eating habits are very important. Choosing the right foods can help ease the discomfort of GERD. Consider working with a diet and nutrition specialist (dietitian) to help you make healthy food choices. What general guidelines should I follow?  Eating plan  Choose healthy foods low in fat, such as fruits, vegetables, whole grains, low-fat dairy products, and lean meat, fish, and poultry.  Eat frequent, small meals instead of three large meals each day. Eat your meals slowly, in a relaxed setting. Avoid bending over or lying down until 2-3 hours after eating.  Limit high-fat foods such as fatty meats or fried  foods.  Limit your intake of oils, butter, and shortening to less than 8 teaspoons each day.  Avoid the following: ? Foods that cause symptoms. These may be different for different people. Keep a food diary to keep track of foods that cause symptoms. ? Alcohol. ? Drinking large amounts of liquid with meals. ? Eating meals during the 2-3 hours before bed.  Cook foods using methods other than frying. This may include baking, grilling, or broiling. Lifestyle  Maintain a healthy weight. Ask your health care provider what weight is healthy for you. If you need to lose weight, work with your health care provider to do so safely.  Exercise for at least 30 minutes on 5 or more days each week, or as told by your health care provider.  Avoid wearing clothes that fit tightly around your waist and chest.  Do not use any products that contain nicotine or tobacco, such as cigarettes and e-cigarettes. If you need help quitting, ask your health care provider.  Sleep with the head of your bed raised. Use a wedge under the mattress or blocks under the bed frame to raise the head of the bed.   What foods are not recommended? The items listed may not be a complete list. Talk with your dietitian about what dietary choices are best for you. Grains Pastries or quick breads with added fat. [place] toast. Vegetables Deep fried vegetables. [place] fries. Any vegetables prepared with added fat. Any vegetables that cause symptoms. For some people this may include tomatoes and tomato products, chili peppers, onions and garlic,  and horseradish. Fruits Any fruits prepared with added fat. Any fruits that cause symptoms. For some people this may include citrus fruits, such as oranges, grapefruit, pineapple, and lemons. Meats and other protein foods High-fat meats, such as fatty beef or pork, hot dogs, ribs, ham, sausage, salami and bacon. Fried meat or protein, including fried fish and fried chicken. Nuts and nut  butters. Dairy Whole milk and chocolate milk. Sour cream. Cream. Ice cream. Cream cheese. Milk shakes. Beverages Coffee and tea, with or without caffeine. Carbonated beverages. Sodas. Energy drinks. Fruit juice made with acidic fruits (such as orange or grapefruit). Tomato juice. Alcoholic drinks. Fats and oils Butter. Margarine. Shortening. Ghee. Sweets and desserts Chocolate and cocoa. Donuts. Seasoning and other foods Pepper. Peppermint and spearmint. Any condiments, herbs, or seasonings that cause symptoms. For some people, this may include curry, hot sauce, or vinegar-based salad dressings.  Summary  When you have gastroesophageal reflux disease (GERD), food and lifestyle choices are very important to help ease the discomfort of GERD.  Eat frequent, small meals instead of three large meals each day. Eat your meals slowly, in a relaxed setting. Avoid bending over or lying down until 2-3 hours after eating.  Limit high-fat foods such as fatty meat or fried foods. This information is not intended to replace advice given to you by your health care provider. Make sure you discuss any questions you have with your health care provider. Document Revised: 06/08/2018 Document Reviewed: 02/17/2016 Elsevier Patient Education  [place].

## 2019-05-15 LAB — HM DIABETES EYE EXAM

## 2019-06-02 ENCOUNTER — Other Ambulatory Visit: Payer: Self-pay | Admitting: Family Medicine

## 2019-06-02 DIAGNOSIS — E1165 Type 2 diabetes mellitus with hyperglycemia: Secondary | ICD-10-CM

## 2019-06-19 ENCOUNTER — Other Ambulatory Visit: Payer: Self-pay | Admitting: Family Medicine

## 2019-06-19 DIAGNOSIS — K219 Gastro-esophageal reflux disease without esophagitis: Secondary | ICD-10-CM

## 2019-07-02 ENCOUNTER — Telehealth: Payer: Self-pay | Admitting: Family Medicine

## 2019-07-02 MED ORDER — GLUCOSE BLOOD VI STRP: 1.0000 | ORAL_STRIP | 5 refills | Status: DC | PRN

## 2019-07-02 NOTE — Telephone Encounter (Signed)
Wife called and left message they need a new rx of Accu Check Guide test strips to [facility] [address] [place].  The will not take transfer from [facility] in [place].  Pt ph [phone]

## 2019-07-02 NOTE — Telephone Encounter (Signed)
Done

## 2019-07-03 ENCOUNTER — Other Ambulatory Visit: Payer: Self-pay | Admitting: Family Medicine

## 2019-07-19 ENCOUNTER — Other Ambulatory Visit: Payer: Self-pay | Admitting: Family Medicine

## 2019-07-19 DIAGNOSIS — E1165 Type 2 diabetes mellitus with hyperglycemia: Secondary | ICD-10-CM

## 2019-07-25 ENCOUNTER — Other Ambulatory Visit: Payer: Self-pay | Admitting: Family Medicine

## 2019-07-25 DIAGNOSIS — I10 Essential (primary) hypertension: Secondary | ICD-10-CM

## 2019-07-25 DIAGNOSIS — K219 Gastro-esophageal reflux disease without esophagitis: Secondary | ICD-10-CM

## 2019-08-08 DIAGNOSIS — D519 Vitamin B12 deficiency anemia, unspecified: Secondary | ICD-10-CM | POA: Insufficient documentation

## 2019-08-08 NOTE — Progress Notes (Signed)
Chief Complaint  Patient presents with  . Diabetes    nonfasting med check, no concerns.    Patient presents for 6 month follow-up on chronic problems. He has no complaints today.  Diabetes--last seen 3 months ago, sugars improved with insulin. He is currently taking onglyza, pioglitazone, and 18 U of Tresiba. Sugars have been running 80-130, mostly 100-125.  These are all fasting, not checked other times in the day. Lab Results  Component Value Date   HGBA1C 7.2 (A) 05/09/2019   Denies hypoglycemia, polydipsia; +polyuria/urinary urgency(has nocturia 3-8x/night, related to radiation).  Denies numbness, tingling, lesions/sores, and checks his feet regularly. Last diabetic eye examwas in 04/2019, no retinopathy. Walks 5-6 days/week, only able to walk 1/2- 3/4 of a mile (limited by knee pain). No longer going to the gym. (He used to go to the gym, and tolerate the bike better).  Hypertension:  He reports compliance with losartan 36m and HCTZ 242m   BP's are running 125-130's/60's-70's at the pharmacy. He denies headaches, dizziness, chest pain or side effects from medications. Has some swelling in the ankles, stable in the last few months. Swelling develops later in the day, not present in the morning.  Prostate cancer: diagnosed in 04/2018. He is s/p radiation treatments. He continues to get Lupron injections every 6 months, scheduled later this month.  At his visit 3 months ago, he was having increased cough.  Felt to be related both to allergies and reflux.  Dr. MaCollene Maresad put him on omeprazole 4042mnce daily, and taking pepcid AC at night, but wasn't adequately controlling symptoms.  At his visit in March we discussed increasing the omeprazole to 30m45mD and starting claritin for allergies. He was to get a CXR if cough didn't improve. (he never went).  He has been taking the omeprazole twice daily, but still has symptoms when he lays down. Hasn't noticed much improvement.  He  coughs some.  Denies shortness of breath. He isn't taking claritin--feels like it is more GERD than allergies.  Denies sniffles, sneezing.  At his last visit he reported: He has been drinking decaf tea, coffee in the mornings (at least 2 cups), has lemonade (with tea--Arnold Palmers). Rare wine (1-2x/week), symptoms are a little worse after wine. +spicy chili on hot dogs.  Today he reports that he cut out the [employer] imits wine to 2x/week.  Still drinking 2 cups of coffee daily. Only having spicy chili with hot dogs 1-2x/month  He was noted to have low B12 in 01/2019. He was advised to start oral B12 and MVI. He reports taking MVI, but not taking a separate B12 supplement.  Lab Results  Component Value Date   VITAMINB12 294 02/05/2019   Lab Results  Component Value Date   WBC 5.6 02/05/2019   HGB 10.8 (L) 02/05/2019   HCT 32.8 (L) 02/05/2019   MCV 97 02/05/2019   PLT 133 (L) 02/05/2019    Hyperlipidemia: he is compliant with taking simvastatin, denies side effects. Lipids were at goal on last check, due for recheck. He is not fasting today.  CKD, stage 3: under the care of nephrologist, goes yearly.   PMH, PSH, SH reviewed  Outpatient Encounter Medications as of 08/09/2019  Medication Sig Note  . aspirin 81 MG tablet Take 81 mg by mouth daily.     . calcipotriene (DOVONOX) 0.005 % ointment    . clotrimazole (LOTRIMIN) 1 % cream Apply 1 application topically as needed.   . fluorouracil (EFUDEX)  5 % cream    . glucose blood test strip 1 each by Other route as needed for other. Use as instructed   . hydrochlorothiazide (HYDRODIURIL) 25 MG tablet Take 1 tablet (25 mg total) by mouth daily. TAKE 1 TABLET(25 MG) BY MOUTH DAILY   . losartan (COZAAR) 50 MG tablet TAKE 1 TABLET BY MOUTH DAILY   . Multiple Vitamins-Minerals (MULTIVITAMIN WITH MINERALS) tablet Take 1 tablet by mouth daily.   Marland Kitchen NOVOFINE PLUS 32G X 4 MM MISC USE DAILY   . Omega-3 Fatty Acids (FISH OIL) 1000 MG  CAPS Take 1 capsule by mouth daily.    Marland Kitchen omeprazole (PRILOSEC) 40 MG capsule TAKE 1 CAPSULE(40 MG) BY MOUTH IN THE MORNING AND AT BEDTIME   . oxybutynin (DITROPAN) 5 MG tablet Take 5 mg by mouth 2 (two) times daily.    . pioglitazone (ACTOS) 45 MG tablet Take 1 tablet (45 mg total) by mouth daily.   . saxagliptin HCl (ONGLYZA) 2.5 MG TABS tablet TAKE 1 TABLET(2.5 MG) BY MOUTH DAILY   . simvastatin (ZOCOR) 20 MG tablet TAKE 1 TABLET(20 MG) BY MOUTH AT BEDTIME   . tamsulosin (FLOMAX) 0.4 MG CAPS capsule Take 1 capsule (0.4 mg total) by mouth 2 (two) times daily after a meal. For urinary urgency or weak stream.   . TRESIBA FLEXTOUCH 100 UNIT/ML FlexTouch Pen INJECT 18 UNITS INTO THE SKIN AT BEDTIME   . Accu-Chek Softclix Lancets lancets 1 each by Other route 2 (two) times daily. Use as instructed   . sildenafil (REVATIO) 20 MG tablet Take 2-5 tablets once daily as needed for erectile dysfunction (Patient not taking: Reported on 05/09/2019)   . valACYclovir (VALTREX) 1000 MG tablet Take 2,000 mg by mouth as needed (fever blister). Reported on 04/09/2015 (Patient not taking: Reported on 08/09/2019) 10/10/2014: From her dermatologist [facility]); prn fever blisters   No facility-administered encounter medications on file as of 08/09/2019.   Allergies  Allergen Reactions  . Ace Inhibitors Cough    ROS: no fever, chills, headaches, dizziness, chest pain, shortness of breath. Denies depression, nausea, vomiting, bowel changes, vision changes, bleeding, bruising, rash. +R knee pain, no other joint pains. No edema in the morning, some at the end of the day.  Cough/throat clearing per HPI, unchanged Sees derm regularly, has surgery planned (lesion R neck)   PHYSICAL EXAM:  BP 132/70   Pulse 60   Ht '6\' 3"'  (1.905 m)   Wt 280 lb (127 kg)   BMI 35.00 kg/m   Wt Readings from Last 3 Encounters:  08/09/19 280 lb (127 kg)  05/09/19 277 lb (125.6 kg)  02/07/19 279 lb 12.8 oz (126.9 kg)    Well-appearing, pleasant male, in no distress.  +frequent throat-clearing during visit, while seated. No significant coughing.  Drinking water helped.  No coughing or change when laying down (though didn't have him completely flat). HEENT: conjunctiva and sclera are clear, EOMI. Nasal mucosa is mildly edematous, some septal deviation. No drainage noted. Sinuses are nontender. OP clear. Neck: no lymphadenopathy or mass, no bruit. Heart: regular rate and rhythm Lungs: clear, no wheezes, rales, ronchi Back: no CVA tenderness or spinal tenderness Abdomen: soft, nontender, no mass Extremities: trace pretibial edema.  Neuro: alert and oriented, normal gait Psych: normal mood, affect, normal eye contact, grooming. Skin: AK's on face, scabbed lesion R neck  Lab Results  Component Value Date   HGBA1C 6.9 (A) 08/09/2019    ASSESSMENT/PLAN:  Type 2 diabetes with nephropathy ([place]) -  Improved control; cont current meds, and dose of 18U tresiba. Periodically check glu in evenings. Diet/exercise reviewed - Plan: HgB A1c, Comprehensive metabolic panel, pioglitazone (ACTOS) 45 MG tablet, saxagliptin HCl (ONGLYZA) 2.5 MG TABS tablet  Stage 3 chronic kidney disease, unspecified whether stage 3a or 3b CKD - under care of nephro  Essential hypertension, benign - BP at goal on current regimen, continue - Plan: Comprehensive metabolic panel, hydrochlorothiazide (HYDRODIURIL) 25 MG tablet, losartan (COZAAR) 50 MG tablet  Gastroesophageal reflux disease without esophagitis - on PPI BID.  Still reporting significant sx, unclear if all from GERD vs allergies. Trial Claritin. Diet reviewed. Wt loss rec - Plan: omeprazole (PRILOSEC) 40 MG capsule  Prostate cancer ([place]) - on Lupron; overall doing well.  +nocturia  Medication monitoring encounter - Plan: CBC with Differential/Platelet, Vitamin B12, Comprehensive metabolic panel, Lipid panel  Anemia due to vitamin B12 deficiency, unspecified B12 deficiency type  - Due for recheck B12 level, only taking MVI, not separate B12. Discussed oral vs injections.  - Plan: CBC with Differential/Platelet, Vitamin B12  Dyslipidemia - due for recheck; to return for fasting labs (not fasting today). Cont statin - Plan: Lipid panel  Hoarseness, persistent - refer to ENT for further evaluation. Trial claritin in addition to his BID PPI - Plan: Ambulatory referral to ENT  Throat clearing - Discussed the habit nature of this, trial of sugar-free lozenges.  Claritin and PPI, ENT eval. - Plan: Ambulatory referral to ENT  Class 2 obesity due to excess calories without serious comorbidity with body mass index (BMI) of 35.0 to 35.9 in adult - HTN, lipids, DM, OA knee.  Counseled re: weight loss, diet, exercise (rec return to gym for bike)   Return for fasting labs-- CBC, B12, c-met, lipid   F/u in December for physical

## 2019-08-09 ENCOUNTER — Other Ambulatory Visit: Payer: Self-pay

## 2019-08-09 ENCOUNTER — Encounter: Payer: Self-pay | Admitting: Family Medicine

## 2019-08-09 ENCOUNTER — Ambulatory Visit: Payer: Medicare PPO | Admitting: Family Medicine

## 2019-08-09 VITALS — BP 132/70 | HR 60 | Ht 75.0 in | Wt 280.0 lb

## 2019-08-09 DIAGNOSIS — E6609 Other obesity due to excess calories: Secondary | ICD-10-CM

## 2019-08-09 DIAGNOSIS — K219 Gastro-esophageal reflux disease without esophagitis: Secondary | ICD-10-CM | POA: Diagnosis not present

## 2019-08-09 DIAGNOSIS — R0989 Other specified symptoms and signs involving the circulatory and respiratory systems: Secondary | ICD-10-CM

## 2019-08-09 DIAGNOSIS — E1121 Type 2 diabetes mellitus with diabetic nephropathy: Secondary | ICD-10-CM

## 2019-08-09 DIAGNOSIS — R6889 Other general symptoms and signs: Secondary | ICD-10-CM

## 2019-08-09 DIAGNOSIS — D519 Vitamin B12 deficiency anemia, unspecified: Secondary | ICD-10-CM

## 2019-08-09 DIAGNOSIS — I10 Essential (primary) hypertension: Secondary | ICD-10-CM | POA: Diagnosis not present

## 2019-08-09 DIAGNOSIS — E785 Hyperlipidemia, unspecified: Secondary | ICD-10-CM

## 2019-08-09 DIAGNOSIS — R490 Dysphonia: Secondary | ICD-10-CM

## 2019-08-09 DIAGNOSIS — N183 Chronic kidney disease, stage 3 unspecified: Secondary | ICD-10-CM

## 2019-08-09 DIAGNOSIS — Z5181 Encounter for therapeutic drug level monitoring: Secondary | ICD-10-CM

## 2019-08-09 DIAGNOSIS — C61 Malignant neoplasm of prostate: Secondary | ICD-10-CM

## 2019-08-09 DIAGNOSIS — Z6835 Body mass index (BMI) 35.0-35.9, adult: Secondary | ICD-10-CM

## 2019-08-09 LAB — POCT GLYCOSYLATED HEMOGLOBIN (HGB A1C): Hemoglobin A1C: 6.9 % — AB (ref 4.0–5.6)

## 2019-08-09 MED ORDER — LOSARTAN POTASSIUM 50 MG PO TABS: ORAL_TABLET | ORAL | 5 refills | Status: DC

## 2019-08-09 MED ORDER — HYDROCHLOROTHIAZIDE 25 MG PO TABS: 25.0000 mg | ORAL_TABLET | Freq: Every day | ORAL | 5 refills | Status: DC

## 2019-08-09 MED ORDER — OMEPRAZOLE 40 MG PO CPDR: DELAYED_RELEASE_CAPSULE | ORAL | 5 refills | Status: DC

## 2019-08-09 MED ORDER — PIOGLITAZONE HCL 45 MG PO TABS: 45.0000 mg | ORAL_TABLET | Freq: Every day | ORAL | 5 refills | Status: DC

## 2019-08-09 MED ORDER — ONGLYZA 2.5 MG PO TABS: ORAL_TABLET | ORAL | 5 refills | Status: DC

## 2019-08-09 NOTE — Patient Instructions (Addendum)
Your sugars are well controlled on your current regimen. Periodically check your sugar at bedtime (2x/week).  Start taking a claritin in case any postnasal drainage/allergies is also contributing to your throat issue. We are referring you to ENT for further evaluation.  In the meantime, continue the twice daily omeprazole. Continue all medications the same.

## 2019-08-13 ENCOUNTER — Other Ambulatory Visit: Payer: Medicare PPO

## 2019-08-13 ENCOUNTER — Other Ambulatory Visit: Payer: Self-pay

## 2019-08-13 DIAGNOSIS — E1121 Type 2 diabetes mellitus with diabetic nephropathy: Secondary | ICD-10-CM

## 2019-08-13 DIAGNOSIS — E785 Hyperlipidemia, unspecified: Secondary | ICD-10-CM

## 2019-08-13 DIAGNOSIS — D519 Vitamin B12 deficiency anemia, unspecified: Secondary | ICD-10-CM

## 2019-08-13 DIAGNOSIS — I10 Essential (primary) hypertension: Secondary | ICD-10-CM

## 2019-08-13 DIAGNOSIS — Z5181 Encounter for therapeutic drug level monitoring: Secondary | ICD-10-CM

## 2019-08-14 LAB — COMPREHENSIVE METABOLIC PANEL
ALT: 22 IU/L (ref 0–44)
AST: 20 IU/L (ref 0–40)
Albumin/Globulin Ratio: 1.9 (ref 1.2–2.2)
Albumin: 3.7 g/dL (ref 3.7–4.7)
Alkaline Phosphatase: 50 IU/L (ref 48–121)
BUN/Creatinine Ratio: 18 (ref 10–24)
BUN: 30 mg/dL — ABNORMAL HIGH (ref 8–27)
Bilirubin Total: 0.3 mg/dL (ref 0.0–1.2)
CO2: 21 mmol/L (ref 20–29)
Calcium: 9.3 mg/dL (ref 8.6–10.2)
Chloride: 103 mmol/L (ref 96–106)
Creatinine, Ser: 1.65 mg/dL — ABNORMAL HIGH (ref 0.76–1.27)
GFR calc Af Amer: 47 mL/min/{1.73_m2} — ABNORMAL LOW (ref >59)
GFR calc non Af Amer: 41 mL/min/{1.73_m2} — ABNORMAL LOW (ref >59)
Globulin, Total: 2 g/dL (ref 1.5–4.5)
Glucose: 127 mg/dL — ABNORMAL HIGH (ref 65–99)
Potassium: 4.1 mmol/L (ref 3.5–5.2)
Sodium: 138 mmol/L (ref 134–144)
Total Protein: 5.7 g/dL — ABNORMAL LOW (ref 6.0–8.5)

## 2019-08-14 LAB — CBC WITH DIFFERENTIAL/PLATELET
Basophils Absolute: 0 10*3/uL (ref 0.0–0.2)
Basos: 1 %
EOS (ABSOLUTE): 0.2 10*3/uL (ref 0.0–0.4)
Eos: 4 %
Hematocrit: 34.3 % — ABNORMAL LOW (ref 37.5–51.0)
Hemoglobin: 11.5 g/dL — ABNORMAL LOW (ref 13.0–17.7)
Immature Grans (Abs): 0 10*3/uL (ref 0.0–0.1)
Immature Granulocytes: 1 %
Lymphocytes Absolute: 0.4 10*3/uL — ABNORMAL LOW (ref 0.7–3.1)
Lymphs: 9 %
MCH: 32.7 pg (ref 26.6–33.0)
MCHC: 33.5 g/dL (ref 31.5–35.7)
MCV: 97 fL (ref 79–97)
Monocytes Absolute: 0.5 10*3/uL (ref 0.1–0.9)
Monocytes: 11 %
Neutrophils Absolute: 3.2 10*3/uL (ref 1.4–7.0)
Neutrophils: 74 %
Platelets: 145 10*3/uL — ABNORMAL LOW (ref 150–450)
RBC: 3.52 x10E6/uL — ABNORMAL LOW (ref 4.14–5.80)
RDW: 14.3 % (ref 11.6–15.4)
WBC: 4.3 10*3/uL (ref 3.4–10.8)

## 2019-08-14 LAB — LIPID PANEL
Chol/HDL Ratio: 3.2 ratio (ref 0.0–5.0)
Cholesterol, Total: 133 mg/dL (ref 100–199)
HDL: 42 mg/dL (ref >39)
LDL Chol Calc (NIH): 76 mg/dL (ref 0–99)
Triglycerides: 78 mg/dL (ref 0–149)
VLDL Cholesterol Cal: 15 mg/dL (ref 5–40)

## 2019-08-14 LAB — VITAMIN B12: Vitamin B-12: 418 pg/mL (ref 232–1245)

## 2019-08-14 MED ORDER — SIMVASTATIN 20 MG PO TABS: ORAL_TABLET | ORAL | 5 refills | Status: DC

## 2019-08-30 ENCOUNTER — Telehealth: Payer: Self-pay | Admitting: Radiation Oncology

## 2019-08-30 NOTE — Telephone Encounter (Signed)
Received fax refill request for patient's tamsulosin. Refill denied because patient is no longer under the care of Dr. Tammi Klippel. Wrote out on return fax suggestion that patient request refill from urologist. Fax confirmation of delivery obtained.

## 2019-10-01 ENCOUNTER — Other Ambulatory Visit: Payer: Self-pay | Admitting: Family Medicine

## 2019-10-01 DIAGNOSIS — E1165 Type 2 diabetes mellitus with hyperglycemia: Secondary | ICD-10-CM

## 2019-10-22 ENCOUNTER — Other Ambulatory Visit: Payer: Self-pay | Admitting: Family Medicine

## 2019-10-22 DIAGNOSIS — E1165 Type 2 diabetes mellitus with hyperglycemia: Secondary | ICD-10-CM

## 2019-12-08 ENCOUNTER — Other Ambulatory Visit: Payer: Self-pay | Admitting: Family Medicine

## 2019-12-08 DIAGNOSIS — E1165 Type 2 diabetes mellitus with hyperglycemia: Secondary | ICD-10-CM

## 2020-01-29 ENCOUNTER — Other Ambulatory Visit: Payer: Self-pay | Admitting: Family Medicine

## 2020-01-29 DIAGNOSIS — E1165 Type 2 diabetes mellitus with hyperglycemia: Secondary | ICD-10-CM

## 2020-02-13 LAB — MICROALBUMIN, URINE: Microalb, Ur: <4

## 2020-02-14 ENCOUNTER — Other Ambulatory Visit: Payer: Self-pay | Admitting: Family Medicine

## 2020-02-14 DIAGNOSIS — E1165 Type 2 diabetes mellitus with hyperglycemia: Secondary | ICD-10-CM

## 2020-02-15 ENCOUNTER — Other Ambulatory Visit: Payer: Self-pay | Admitting: Family Medicine

## 2020-02-15 DIAGNOSIS — E1121 Type 2 diabetes mellitus with diabetic nephropathy: Secondary | ICD-10-CM

## 2020-02-15 NOTE — Telephone Encounter (Signed)
Has upcoming appt °

## 2020-02-20 DIAGNOSIS — N2581 Secondary hyperparathyroidism of renal origin: Secondary | ICD-10-CM | POA: Insufficient documentation

## 2020-02-20 DIAGNOSIS — E1122 Type 2 diabetes mellitus with diabetic chronic kidney disease: Secondary | ICD-10-CM | POA: Insufficient documentation

## 2020-02-20 DIAGNOSIS — N183 Chronic kidney disease, stage 3 unspecified: Secondary | ICD-10-CM | POA: Insufficient documentation

## 2020-02-20 DIAGNOSIS — Z794 Long term (current) use of insulin: Secondary | ICD-10-CM | POA: Insufficient documentation

## 2020-02-20 NOTE — Progress Notes (Addendum)
Chief Complaint  Patient presents with  . Medicare Wellness    Nonfasting AWV/CPE. Still having trouble at night when he lays down with his GERD, throat burning and coughing.     James Glover is a 74 y.o. male who presents for annual physical exam, Medicare wellness visit and follow-up on chronic medical conditions.     Diabetes-- He is currently taking onglyza, pioglitazone, and 18 U of Tresiba.  Sugars have been running 84-151 over the last 4 months (fasting), mostly 105-135. Doesn't check other times of day.   Lab Results  Component Value Date   HGBA1C 6.9 (A) 08/09/2019   Denies hypoglycemia, polydipsia;+polyuria/urinary urgency(has nocturia 3-4x/night).  Denies numbness, tingling, lesions/sores, and checks his feet regularly. Last diabetic eye examwas in 04/2019, no retinopathy. Walks 5-6 days/week, only able to walk 1/2- 3/4 of a mile (limited by knee pain). No longer going to the gym. (He used to go to the gym, and tolerate the bike better). He has lost 10#, intentional.  He cut back on starches, eating more greens, and cut back on sweets.  Hypertension: He reports compliance with losartan 4m and HCTZ 276m  BP's are running130/60's at pharmacy, 134/68 at Dr. CoBirdie Riddleffice on 12/14. He denies headaches, dizziness, chest pain or side effects from medications. Has some swelling in the ankles, starting later in the day, not present in the morning.  Prostate cancer: diagnosed in 04/2018. He is s/p radiation treatments. He continues to get Lupron injectionsevery 6 months. Got his last injection today.  Last PSA was 0.5, from this week.  Cough--felt to be related both to allergies and reflux.  He has been taking the omeprazole twice daily, but still has symptoms when he lays down. Cough and burning in the chest.  Not better, but not worse.  He coughs some. He [place] Dr[place]ene [place], when the Omeprazole dose was increased from 20 to 4061m He doesn't notice  improvement.  Denies shortness of breath. He isn't taking claritin--feels like it is more GERD than allergies. He takes that in the Spring, when sneezing.  Denies sniffles, sneezing. 2 cups of coffee daily, sips on [employer] day long (decaff tea with splenda, and crystal light lemonade). Wine 3 glasses/week.  He was noted to have low B12 in 01/2019. He was advised to start oral B12 and MVI. He reports taking MVI, but not taking a separate B12 supplement. Last B12 was improved Lab Results  Component Value Date   VITAMINB12 418 08/13/2019   Hyperlipidemia: he is compliant with taking simvastatin, denies side effects.Lipids were at goal on last check Lab Results  Component Value Date   CHOL 133 08/13/2019   HDL 42 08/13/2019   LDLCALC 76 08/13/2019   TRIG 78 08/13/2019   CHOLHDL 3.2 08/13/2019   CKD, stage 3: under the care of nephrologist, goes yearly. He was just seen 12/14.  See scanned notes/labs.  Cr was 1.64.  Normal urine microalb/Cr ratio.  Hgb 11.4, Vitamin D-OH 37.3.  Immunization History  Administered Date(s) Administered  . DTaP 07/25/2008  . Influenza Split 11/17/2011, 12/30/2013, 12/26/2014  . Influenza, High Dose Seasonal PF 01/30/2013, 11/12/2015, 12/08/2016, 01/16/2018, 12/25/2018  . Moderna Sars-Covid-2 Vaccination 05/15/2019, 06/12/2019  . Pneumococcal Conjugate-13 03/04/2014  . Pneumococcal Polysaccharide-23 11/29/2009, 04/09/2015  . Tdap 07/25/2008  . Zoster 09/05/2013   Last colonoscopy: 05/05/10  Last PSA:now being monitored by urologist Dentist: goes 2-3 times per year.  Ophtho: yearly (last 04/2019) Exercise: walking every day,  1/2-3/4 miles, occasionally 1 mile (20-40 minutes) (used to use exercise bike at the gym).  Other doctors caring for patient include: Dentist: Dr. Johnnye Sima Ophtho: Dr. Satira Sark ([place] ophtho) GI: Dr. Collene Mares  Nephro: Dr. Arty Baumgartner Cardiologist: Dr. Claiborne Billings Dermatologist: Dr.Haverstock Ortho: Dr. Victorino December (also saw Dr.  Delilah Shan for knee pain, at [facility]) Urologist: Dr. Tresa Moore Radiation onc: Dr. Tammi Klippel  Depression screen: Negative Fall screen: negative Functional Status Screen: some urinary urgency (and sometimes leakage); some fecal urgency since radiation, but improved, no incontinence. Mini-Cog screen: normal  End of Life Discussion: Patient has filled out his living will, needs to get it notarized (brought today, notary isn't here today)..  PMH, PSH, SH and FH reviewed and updated.  Outpatient Encounter Medications as of 02/21/2020  Medication Sig Note  . Accu-Chek Softclix Lancets lancets 1 each by Other route 2 (two) times daily. Use as instructed   . aspirin 81 MG tablet Take 81 mg by mouth daily.   Marland Kitchen glucose blood test strip 1 each by Other route as needed for other. Use as instructed   . hydrochlorothiazide (HYDRODIURIL) 25 MG tablet Take 1 tablet (25 mg total) by mouth daily. TAKE 1 TABLET(25 MG) BY MOUTH DAILY   . losartan (COZAAR) 50 MG tablet TAKE 1 TABLET BY MOUTH DAILY   . Multiple Vitamins-Minerals (MULTIVITAMIN WITH MINERALS) tablet Take 1 tablet by mouth daily.   Marland Kitchen NOVOFINE PLUS 32G X 4 MM MISC USE DAILY   . Omega-3 Fatty Acids (FISH OIL) 1000 MG CAPS Take 1 capsule by mouth daily.    Marland Kitchen omeprazole (PRILOSEC) 40 MG capsule TAKE 1 CAPSULE(40 MG) BY MOUTH IN THE MORNING AND AT BEDTIME   . oxybutynin (DITROPAN) 5 MG tablet Take 5 mg by mouth 2 (two) times daily.    . pioglitazone (ACTOS) 45 MG tablet TAKE 1 TABLET(45 MG) BY MOUTH DAILY   . saxagliptin HCl (ONGLYZA) 2.5 MG TABS tablet TAKE 1 TABLET(2.5 MG) BY MOUTH DAILY   . simvastatin (ZOCOR) 20 MG tablet TAKE 1 TABLET(20 MG) BY MOUTH AT BEDTIME   . TRESIBA FLEXTOUCH 100 UNIT/ML FlexTouch Pen INJECT 18 UNITS INTO THE SKIN AT BEDTIME   . calcipotriene (DOVONOX) 0.005 % ointment  (Patient not taking: Reported on 02/21/2020)   . clotrimazole (LOTRIMIN) 1 % cream Apply 1 application topically as needed. (Patient not taking: Reported  on 02/21/2020)   . fluorouracil (EFUDEX) 5 % cream  (Patient not taking: Reported on 02/21/2020)   . sildenafil (REVATIO) 20 MG tablet Take 2-5 tablets once daily as needed for erectile dysfunction (Patient not taking: No sig reported)   . tamsulosin (FLOMAX) 0.4 MG CAPS capsule Take 1 capsule (0.4 mg total) by mouth 2 (two) times daily after a meal. For urinary urgency or weak stream. (Patient not taking: No sig reported) 02/21/2020: Ran out last week, got refill from urologist today.  . valACYclovir (VALTREX) 1000 MG tablet Take 2,000 mg by mouth as needed (fever blister). Reported on 04/09/2015 (Patient not taking: No sig reported) 10/10/2014: From her dermatologist [facility]); prn fever blisters   No facility-administered encounter medications on file as of 02/21/2020.    Allergies  Allergen Reactions  . Ace Inhibitors Cough   ROS: The patient denies anorexia, fever, headaches, vision loss, decreased hearing, ear pain, hoarseness, chest pain, palpitations, dizziness, syncope, dyspnea on exertion, cough, swelling, nausea, vomiting, diarrhea, constipation, abdominal pain, melena, hematochezia, hematuria, incontinence, weakened urine stream, dysuria, genital lesions, joint pains, numbness, tingling, weakness, tremor, depression, anxiety, abnormal bleeding/bruising,  or enlarged lymph nodes.  Up 3-4 times/night to void, and every 2-3 hours during the day. +ED +snoring per wife, not as much as in the past. Wakes up feeling refreshed, no daytime somnolence. Is aware that he would wake up startled/snoring when laying on his back, but he no longer sleeps on his back. Seasonal allergies, not flaring now. Persistent reflux, per HPI. Some trouble passing urine since ran out of flomax last week. Intermittent problems with jock itch, uses antifungal creams prn with good results. Intentional weight loss   PHYSICAL EXAM:  BP 138/70   Pulse 84   Ht $R'6\' 3"'TL$  (1.905 m)   Wt 270 lb 12.8 oz (122.8 kg)    BMI 33.85 kg/m   Wt Readings from Last 3 Encounters:  02/21/20 270 lb 12.8 oz (122.8 kg)  08/09/19 280 lb (127 kg)  05/09/19 277 lb (125.6 kg)    General Appearance:  Alert, cooperative, no distress, appears stated age   Head:  Normocephalic, without obvious abnormality, atraumatic   Eyes:  PERRL, conjunctiva/corneas clear, EOM's intact, fundi benign   Ears:  Normal TM's and external ear canals  Nose:  Not examined, wearing mask due to COVID-19 pandemic   Throat:  Not examined, wearing mask due to COVID-19 pandemic  Neck:  Supple, no lymphadenopathy; thyroid: no enlargement/ tenderness/nodules; no carotid bruit or JVD   Back:  Spine nontender, no curvature, ROM normal, no CVA tenderness   Lungs:  Clear to auscultation bilaterally without wheezes, rales or ronchi; respirations unlabored   Chest Wall:  No tenderness or deformity   Heart:  Regular rate and rhythm, S1 and S2 normal, no murmur, rub or gallop   Breast Exam:  No chest wall tenderness, masses or gynecomastia   Abdomen:  Soft, non-tender, nondistended, normoactive bowel sounds, no masses, no hepatosplenomegaly.   Genitalia:  Deferred to urologist   Rectal:  Deferred to urologist  Extremities:  No clubbing, cyanosis or edema   Pulses:  2+ and symmetric all extremities   Skin:  Skin is dry/flaky, especially on lower legs; no rashes or lesions. Normal diabetic foot exam. Sebaceous cyst at upper back, flat with small black central pore.  No induration, erythema or tenderness. SK mid back. Skin tags R posterior axilla  Lymph nodes:  Cervical, supraclavicular, and axillary nodes normal   Neurologic:  Normal strength, sensation and gait; reflexes 2+ and symmetric throughout   Psych: Normal mood, affect, hygiene and grooming       Diabetic foot exam--normal sensation to monofilament. onychomycotic toenails.   Lab Results  Component  Value Date   HGBA1C 6.9 (A) 02/21/2020     ASSESSMENT/PLAN:  Annual physical exam  Medicare annual wellness visit, subsequent  Stage 3b chronic kidney disease ([place]) - stable; recently saw nephro - Plan: Comprehensive metabolic panel  Controlled type 2 diabetes mellitus with stage 3 chronic kidney disease, with long-term current use of insulin ([place]) - continue current meds.  Encouraged further wt loss, cont proper diet - Plan: HgB A1c, insulin degludec (TRESIBA FLEXTOUCH) 100 UNIT/ML FlexTouch Pen, Hemoglobin A1c, Comprehensive metabolic panel, TSH  Essential hypertension, benign - controlled - Plan: hydrochlorothiazide (HYDRODIURIL) 25 MG tablet, losartan (COZAAR) 50 MG tablet, Comprehensive metabolic panel  Prostate cancer ([facility]) - Doing well, per urology  Dyslipidemia - cont statin - Plan: simvastatin (ZOCOR) 20 MG tablet, Lipid panel  Anemia due to vitamin B12 deficiency, unspecified B12 deficiency type - Plan: CBC with Differential/Platelet, Vitamin B12  Secondary hyperparathyroidism of renal origin ([place]) -  Plan: Comprehensive metabolic panel  Need for influenza vaccination - Plan: Flu Vaccine QUAD High Dose(Fluad)  Need for viral immunization - Plan: Moderna Covid-19 Booster  Gastroesophageal reflux disease without esophagitis - on PPI BID.  Still reporting significant sx. Counseled re: diet, wt loss. can d/w GI (colonoscopy due in March). Rec elevate HOB, less lemonade/tea - Plan: omeprazole (PRILOSEC) 40 MG capsule  Type 2 diabetes with nephropathy ([place]) - Improved control; cont current meds, and dose of 18U tresiba. Periodically check glu in evenings. Diet/exercise reviewed - Plan: pioglitazone (ACTOS) 45 MG tablet, saxagliptin HCl (ONGLYZA) 2.5 MG TABS tablet  Medication monitoring encounter - Plan: Hemoglobin A1c, CBC with Differential/Platelet, Lipid panel, Comprehensive metabolic panel, Vitamin O30  Aortic atherosclerosis ([place]) - noted on CT 05/2018.  Cont  statin    RF HCTZ, losartan, omeprazole, pioglitazone, onglyza, simvastatin   Recommended at least 30 minutes of aerobic activity at least 5 days/week, weight-bearing exercise at least 2x/week; proper sunscreen use reviewed; healthy diet and alcohol recommendations (less than or equal to 2 drinks/day) reviewed; regular seatbelt use; changing batteries in smoke detectors. Immunization recommendations discussed--Continue yearly flu shots. Moderna booster given today.  To get Tdap from pharmacy.  Shingrix recommended, to get at pharmacy; risks/side effects reviewed.  Colonoscopy recommendations reviewed, due in 04/2020.  MOST form updated and reviewed. Full Code, Full care  To get living will and healthcare power of attorney forms notarized, then give [place] copy to scan into chart.  F/u 6 months with labs prior--A1c, c-met, cbc, lipid, TSH, B12  Medicare Attestation I have personally reviewed: The patient's medical and social history Their use of alcohol, tobacco or illicit drugs Their current medications and supplements The patient's functional ability including ADLs,fall risks, home safety risks, cognitive, and hearing and visual impairment Diet and physical activities Evidence for depression or mood disorders  The patient's weight, height, BMI have been recorded in the chart.  I have made referrals, counseling, and provided education to the patient based on review of the above and I have provided the patient with a written personalized care plan for preventive services.

## 2020-02-20 NOTE — Patient Instructions (Addendum)
HEALTH MAINTENANCE RECOMMENDATIONS:  It is recommended that you get at least 30 minutes of aerobic exercise at least 5 days/week (for weight loss, you may need as much as 60-90 minutes). This can be any activity that gets your heart rate up. This can be divided in 10-15 minute intervals if needed, but try and build up your endurance at least once a week.  Weight bearing exercise is also recommended twice weekly.  Eat a healthy diet with lots of vegetables, fruits and fiber.  "Colorful" foods have a lot of vitamins (ie green vegetables, tomatoes, red peppers, etc).  Limit sweet tea, regular sodas and alcoholic beverages, all of which has a lot of calories and sugar.  Up to 2 alcoholic drinks daily may be beneficial for men (unless trying to lose weight, watch sugars).  Drink a lot of water.  Sunscreen of at least SPF 30 should be used on all sun-exposed parts of the skin when outside between the hours of 10 am and 4 pm (not just when at beach or pool, but even with exercise, golf, tennis, and yard work!)  Use a sunscreen that says "broad spectrum" so it covers both UVA and UVB rays, and make sure to reapply every 1-2 hours.  Remember to change the batteries in your smoke detectors when changing your clock times in the spring and fall.  Carbon monoxide detectors are recommended for your home.  Use your seat belt every time you are in a car, and please drive safely and not be distracted with cell phones and texting while driving.    James Glover , Thank you for taking time to come for your Medicare Wellness Visit. I appreciate your ongoing commitment to your health goals. Please review the following plan we discussed and let me know if I can assist you in the future.    This is a list of the screening recommended for you and due dates:  Health Maintenance  Topic Date Due  . Tetanus Vaccine  07/26/2018  . COVID-19 Vaccine (3 - Moderna risk 4-dose series) 07/10/2019  . Flu Shot  09/30/2019  .  Complete foot exam   02/07/2020  . Hemoglobin A1C  02/08/2020  . Eye exam for diabetics  05/14/2020  . Colon Cancer Screening  05/14/2020  .  Hepatitis C: One time screening is recommended by Center for Disease Control  (CDC) for  adults born from [birth year] through 1964.   Completed  . Pneumonia vaccines  Completed   You need to get your tetanus booster (TdaP) from the pharmacy. I gave you a prescription for this last year, but I'm pretty sure that you do not need a prescription.  Simply tell them you want to get a TdaP (you may be able to call and make an appointment).  This should be every 10 years (you were due 06/2018).  Please fill out living will and healthcare power of attorney, and get [place] a copy of the notarized forms so that we can scan them into your medical chart.  I recommend getting the new shingles vaccine (Shingrix). If you have Medicare, you will need to get this from the pharmacy, as it is covered by Part D.  This is a series of 2 injections, spaced 2 months apart.  It doesn't have to be exactly 2 months apart (but can't be under 2 months), if that isn't feasible for your schedule, but try and get them close to 2 months (and definitely within 6 months of each other,  or else the efficacy of the vaccine drops off).  You will be due for another colonoscopy in 04/2020. You can discuss your reflux with Dr. Collene Mares.  In the meantime, cut back on lemonade, continue to lose weight. Wait at least 2-3 hours after eating before laying down. Try using a wedge pillow or elevate the head of the bed.  Please try cutting back on the lemonade and caffeine to help improve your reflux.  Try elevating the head of the bed as well.  You need to wait 2 weeks from today before getting any other vaccines (due to getting 2 vaccines today).

## 2020-02-21 ENCOUNTER — Ambulatory Visit: Payer: Medicare PPO | Admitting: Family Medicine

## 2020-02-21 ENCOUNTER — Encounter: Payer: Self-pay | Admitting: Family Medicine

## 2020-02-21 ENCOUNTER — Other Ambulatory Visit: Payer: Self-pay

## 2020-02-21 VITALS — BP 138/70 | HR 84 | Ht 75.0 in | Wt 270.8 lb

## 2020-02-21 DIAGNOSIS — N2581 Secondary hyperparathyroidism of renal origin: Secondary | ICD-10-CM

## 2020-02-21 DIAGNOSIS — I700 Atherosclerosis of aorta: Secondary | ICD-10-CM | POA: Diagnosis not present

## 2020-02-21 DIAGNOSIS — C61 Malignant neoplasm of prostate: Secondary | ICD-10-CM

## 2020-02-21 DIAGNOSIS — D519 Vitamin B12 deficiency anemia, unspecified: Secondary | ICD-10-CM

## 2020-02-21 DIAGNOSIS — E1122 Type 2 diabetes mellitus with diabetic chronic kidney disease: Secondary | ICD-10-CM

## 2020-02-21 DIAGNOSIS — K219 Gastro-esophageal reflux disease without esophagitis: Secondary | ICD-10-CM

## 2020-02-21 DIAGNOSIS — Z23 Encounter for immunization: Secondary | ICD-10-CM | POA: Diagnosis not present

## 2020-02-21 DIAGNOSIS — I10 Essential (primary) hypertension: Secondary | ICD-10-CM

## 2020-02-21 DIAGNOSIS — Z5181 Encounter for therapeutic drug level monitoring: Secondary | ICD-10-CM

## 2020-02-21 DIAGNOSIS — N183 Chronic kidney disease, stage 3 unspecified: Secondary | ICD-10-CM | POA: Diagnosis not present

## 2020-02-21 DIAGNOSIS — Z0000 Encounter for general adult medical examination without abnormal findings: Secondary | ICD-10-CM | POA: Diagnosis not present

## 2020-02-21 DIAGNOSIS — E785 Hyperlipidemia, unspecified: Secondary | ICD-10-CM

## 2020-02-21 DIAGNOSIS — N1832 Chronic kidney disease, stage 3b: Secondary | ICD-10-CM

## 2020-02-21 DIAGNOSIS — E1121 Type 2 diabetes mellitus with diabetic nephropathy: Secondary | ICD-10-CM

## 2020-02-21 DIAGNOSIS — Z794 Long term (current) use of insulin: Secondary | ICD-10-CM | POA: Diagnosis not present

## 2020-02-21 LAB — POCT GLYCOSYLATED HEMOGLOBIN (HGB A1C): Hemoglobin A1C: 6.9 % — AB (ref 4.0–5.6)

## 2020-02-21 MED ORDER — TRESIBA FLEXTOUCH 100 UNIT/ML ~~LOC~~ SOPN
PEN_INJECTOR | SUBCUTANEOUS | 1 refills | Status: DC
Start: 2020-02-21 — End: 2020-05-26

## 2020-02-21 MED ORDER — ONGLYZA 2.5 MG PO TABS
ORAL_TABLET | ORAL | 1 refills | Status: DC
Start: 2020-02-21 — End: 2020-08-21

## 2020-02-21 MED ORDER — HYDROCHLOROTHIAZIDE 25 MG PO TABS: 25.0000 mg | ORAL_TABLET | Freq: Every day | ORAL | 1 refills | Status: DC

## 2020-02-21 MED ORDER — LOSARTAN POTASSIUM 50 MG PO TABS: ORAL_TABLET | ORAL | 1 refills | Status: DC

## 2020-02-21 MED ORDER — PIOGLITAZONE HCL 45 MG PO TABS: ORAL_TABLET | ORAL | 1 refills | Status: DC

## 2020-02-21 MED ORDER — OMEPRAZOLE 40 MG PO CPDR: DELAYED_RELEASE_CAPSULE | ORAL | 1 refills | Status: DC

## 2020-02-21 MED ORDER — SIMVASTATIN 20 MG PO TABS: ORAL_TABLET | ORAL | 1 refills | Status: DC

## 2020-02-24 DIAGNOSIS — I700 Atherosclerosis of aorta: Secondary | ICD-10-CM | POA: Insufficient documentation

## 2020-03-11 ENCOUNTER — Other Ambulatory Visit: Payer: Self-pay | Admitting: Family Medicine

## 2020-03-11 DIAGNOSIS — K219 Gastro-esophageal reflux disease without esophagitis: Secondary | ICD-10-CM

## 2020-03-18 ENCOUNTER — Other Ambulatory Visit: Payer: Self-pay | Admitting: Family Medicine

## 2020-03-18 DIAGNOSIS — E1121 Type 2 diabetes mellitus with diabetic nephropathy: Secondary | ICD-10-CM

## 2020-04-01 ENCOUNTER — Other Ambulatory Visit: Payer: Self-pay | Admitting: Internal Medicine

## 2020-04-29 DIAGNOSIS — H52203 Unspecified astigmatism, bilateral: Secondary | ICD-10-CM | POA: Diagnosis not present

## 2020-04-29 DIAGNOSIS — H5203 Hypermetropia, bilateral: Secondary | ICD-10-CM | POA: Diagnosis not present

## 2020-04-29 LAB — HM DIABETES EYE EXAM

## 2020-05-26 ENCOUNTER — Other Ambulatory Visit: Payer: Self-pay | Admitting: Family Medicine

## 2020-05-26 DIAGNOSIS — Z794 Long term (current) use of insulin: Secondary | ICD-10-CM

## 2020-05-26 DIAGNOSIS — N183 Chronic kidney disease, stage 3 unspecified: Secondary | ICD-10-CM

## 2020-05-26 DIAGNOSIS — E1122 Type 2 diabetes mellitus with diabetic chronic kidney disease: Secondary | ICD-10-CM

## 2020-06-19 ENCOUNTER — Other Ambulatory Visit: Payer: Self-pay | Admitting: Family Medicine

## 2020-06-19 DIAGNOSIS — E1121 Type 2 diabetes mellitus with diabetic nephropathy: Secondary | ICD-10-CM

## 2020-07-10 DIAGNOSIS — D225 Melanocytic nevi of trunk: Secondary | ICD-10-CM | POA: Diagnosis not present

## 2020-07-10 DIAGNOSIS — K219 Gastro-esophageal reflux disease without esophagitis: Secondary | ICD-10-CM | POA: Diagnosis not present

## 2020-07-10 DIAGNOSIS — L814 Other melanin hyperpigmentation: Secondary | ICD-10-CM | POA: Diagnosis not present

## 2020-07-10 DIAGNOSIS — K439 Ventral hernia without obstruction or gangrene: Secondary | ICD-10-CM | POA: Diagnosis not present

## 2020-07-10 DIAGNOSIS — L578 Other skin changes due to chronic exposure to nonionizing radiation: Secondary | ICD-10-CM | POA: Diagnosis not present

## 2020-07-10 DIAGNOSIS — Z1211 Encounter for screening for malignant neoplasm of colon: Secondary | ICD-10-CM | POA: Diagnosis not present

## 2020-07-10 DIAGNOSIS — Z85828 Personal history of other malignant neoplasm of skin: Secondary | ICD-10-CM | POA: Diagnosis not present

## 2020-07-10 DIAGNOSIS — L570 Actinic keratosis: Secondary | ICD-10-CM | POA: Diagnosis not present

## 2020-07-10 DIAGNOSIS — K5900 Constipation, unspecified: Secondary | ICD-10-CM | POA: Diagnosis not present

## 2020-07-10 DIAGNOSIS — L821 Other seborrheic keratosis: Secondary | ICD-10-CM | POA: Diagnosis not present

## 2020-07-10 DIAGNOSIS — K5730 Diverticulosis of large intestine without perforation or abscess without bleeding: Secondary | ICD-10-CM | POA: Diagnosis not present

## 2020-07-17 ENCOUNTER — Other Ambulatory Visit: Payer: Self-pay | Admitting: Family Medicine

## 2020-07-22 ENCOUNTER — Encounter: Payer: Self-pay | Admitting: Family Medicine

## 2020-07-23 DIAGNOSIS — Z1212 Encounter for screening for malignant neoplasm of rectum: Secondary | ICD-10-CM | POA: Diagnosis not present

## 2020-07-23 DIAGNOSIS — Z1211 Encounter for screening for malignant neoplasm of colon: Secondary | ICD-10-CM | POA: Diagnosis not present

## 2020-07-31 LAB — COLOGUARD
COLOGUARD: NEGATIVE
Cologuard: NEGATIVE

## 2020-07-31 LAB — EXTERNAL GENERIC LAB PROCEDURE: COLOGUARD: NEGATIVE

## 2020-08-04 ENCOUNTER — Encounter: Payer: Self-pay | Admitting: Family Medicine

## 2020-08-04 ENCOUNTER — Encounter: Payer: Self-pay | Admitting: *Deleted

## 2020-08-19 ENCOUNTER — Other Ambulatory Visit: Payer: Medicare PPO

## 2020-08-19 ENCOUNTER — Other Ambulatory Visit: Payer: Self-pay

## 2020-08-19 DIAGNOSIS — E785 Hyperlipidemia, unspecified: Secondary | ICD-10-CM | POA: Diagnosis not present

## 2020-08-19 DIAGNOSIS — N2581 Secondary hyperparathyroidism of renal origin: Secondary | ICD-10-CM

## 2020-08-19 DIAGNOSIS — E1122 Type 2 diabetes mellitus with diabetic chronic kidney disease: Secondary | ICD-10-CM

## 2020-08-19 DIAGNOSIS — Z5181 Encounter for therapeutic drug level monitoring: Secondary | ICD-10-CM | POA: Diagnosis not present

## 2020-08-19 DIAGNOSIS — N183 Chronic kidney disease, stage 3 unspecified: Secondary | ICD-10-CM | POA: Diagnosis not present

## 2020-08-19 DIAGNOSIS — D519 Vitamin B12 deficiency anemia, unspecified: Secondary | ICD-10-CM

## 2020-08-19 DIAGNOSIS — I10 Essential (primary) hypertension: Secondary | ICD-10-CM | POA: Diagnosis not present

## 2020-08-19 DIAGNOSIS — Z794 Long term (current) use of insulin: Secondary | ICD-10-CM | POA: Diagnosis not present

## 2020-08-19 DIAGNOSIS — N1832 Chronic kidney disease, stage 3b: Secondary | ICD-10-CM

## 2020-08-20 DIAGNOSIS — C61 Malignant neoplasm of prostate: Secondary | ICD-10-CM | POA: Diagnosis not present

## 2020-08-20 LAB — COMPREHENSIVE METABOLIC PANEL
ALT: 17 IU/L (ref 0–44)
AST: 15 IU/L (ref 0–40)
Albumin/Globulin Ratio: 1.9 (ref 1.2–2.2)
Albumin: 4 g/dL (ref 3.7–4.7)
Alkaline Phosphatase: 55 IU/L (ref 44–121)
BUN/Creatinine Ratio: 14 (ref 10–24)
BUN: 27 mg/dL (ref 8–27)
Bilirubin Total: 0.3 mg/dL (ref 0.0–1.2)
CO2: 24 mmol/L (ref 20–29)
Calcium: 9.4 mg/dL (ref 8.6–10.2)
Chloride: 101 mmol/L (ref 96–106)
Creatinine, Ser: 1.92 mg/dL — ABNORMAL HIGH (ref 0.76–1.27)
Globulin, Total: 2.1 g/dL (ref 1.5–4.5)
Glucose: 104 mg/dL — ABNORMAL HIGH (ref 65–99)
Potassium: 4.5 mmol/L (ref 3.5–5.2)
Sodium: 140 mmol/L (ref 134–144)
Total Protein: 6.1 g/dL (ref 6.0–8.5)
eGFR: 36 mL/min/{1.73_m2} — ABNORMAL LOW (ref >59)

## 2020-08-20 LAB — CBC WITH DIFFERENTIAL/PLATELET
Basophils Absolute: 0 10*3/uL (ref 0.0–0.2)
Basos: 1 %
EOS (ABSOLUTE): 0.2 10*3/uL (ref 0.0–0.4)
Eos: 3 %
Hematocrit: 36.5 % — ABNORMAL LOW (ref 37.5–51.0)
Hemoglobin: 12.3 g/dL — ABNORMAL LOW (ref 13.0–17.7)
Immature Grans (Abs): 0 10*3/uL (ref 0.0–0.1)
Immature Granulocytes: 0 %
Lymphocytes Absolute: 0.5 10*3/uL — ABNORMAL LOW (ref 0.7–3.1)
Lymphs: 11 %
MCH: 32.5 pg (ref 26.6–33.0)
MCHC: 33.7 g/dL (ref 31.5–35.7)
MCV: 97 fL (ref 79–97)
Monocytes Absolute: 0.4 10*3/uL (ref 0.1–0.9)
Monocytes: 9 %
Neutrophils Absolute: 3.5 10*3/uL (ref 1.4–7.0)
Neutrophils: 76 %
Platelets: 151 10*3/uL (ref 150–450)
RBC: 3.78 x10E6/uL — ABNORMAL LOW (ref 4.14–5.80)
RDW: 12.7 % (ref 11.6–15.4)
WBC: 4.6 10*3/uL (ref 3.4–10.8)

## 2020-08-20 LAB — VITAMIN B12: Vitamin B-12: 686 pg/mL (ref 232–1245)

## 2020-08-20 LAB — LIPID PANEL
Chol/HDL Ratio: 2.7 ratio (ref 0.0–5.0)
Cholesterol, Total: 104 mg/dL (ref 100–199)
HDL: 39 mg/dL — ABNORMAL LOW (ref >39)
LDL Chol Calc (NIH): 49 mg/dL (ref 0–99)
Triglycerides: 77 mg/dL (ref 0–149)
VLDL Cholesterol Cal: 16 mg/dL (ref 5–40)

## 2020-08-20 LAB — HEMOGLOBIN A1C
Est. average glucose Bld gHb Est-mCnc: 154 mg/dL
Hgb A1c MFr Bld: 7 % — ABNORMAL HIGH (ref 4.8–5.6)

## 2020-08-20 LAB — TSH: TSH: 3.01 u[IU]/mL (ref 0.450–4.500)

## 2020-08-20 NOTE — Progress Notes (Signed)
Chief Complaint  Patient presents with   Diabetes    Nonfasting med check. Has not gotten Tdap or shingrix but he will. He does not want #4 covid. Had eye exam 3/22-I will get. Has no new concerns.     Patient presents for 6 month follow-up on chronic problems. See below for labs done prior to visit.  Diabetes-- He is currently taking onglyza, pioglitazone, and 18 U of Tresiba.  A1c in December was 6.9 on this regimen. Sugars have been running 83-136 in the mornings, mostly 110-130.  Up to 140's when he was out of [place]. Only checked pm values twice (in Feb/March, 121, 128). Denies hypoglycemia, polydipsia; + polyuria/urinary urgency (has nocturia 4-5x/night). Denies numbness, tingling, lesions/sores, and checks his feet regularly. Last diabetic eye exam in chart is 04/2019, no retinopathy. Pt reports he had exam 04/2020, no report received, will call for it.  Walks 5-6 days/week, only able to walk 1/2- 3/4 of a mile (limited by knee pain). No longer going to the gym. (He used to go to the gym, and tolerates the bike better). Hopes to start back.  Has been at the beach a lot.   Hypertension:  He reports compliance with losartan 50mg  and HCTZ 25mg .   BP's are running 120-130 max of 140/60's-70 at pharmacy He denies headaches, dizziness, chest pain or side effects from medications. Has some swelling in the L ankle, starting later in the day, not present in the morning.   Cough--felt to be related both to allergies and reflux.  He had been taking the omeprazole twice daily, but still had cough and burning in the chest when laying down; no shortness of breath.  Dr. Collene Mares changed him to pepcid 40mg  at night, and omeprazole in the morning in May 2022. Cough is much better.  He doesn't need to take any claritin. Cologuard was negative.  On her labs, Hg 11.7, Cr 1.81.  He continues to have 2 cups of coffee daily, sips on [employer]  all day long (decaff tea with splenda, and crystal light  lemonade). Wine 3-5 glasses/week.   He was noted to have low B12 in 01/2019. He was advised to start oral B12 and MVI. He reports taking MVI, but not taking a separate B12 supplement. B12 level in 07/2019 was normal at 418 on MVI.   Hyperlipidemia: he is compliant with taking simvastatin, denies side effects. He tries to follow a lowfat, low cholesterol diet.   CKD, stage 3: under the care of nephrologist, goes yearly, last in December 2021.  See scanned notes/labs.  Cr was 1.64 (but up to 1.81 on labs from Dr. Collene Mares 06/2020; Hg noted to be 11.7 also).  Normal urine microalb/Cr ratio.  Hgb 11.4, Vitamin D-OH 37.3.    PMH, PSH, SH reviewed  Outpatient Encounter Medications as of 08/21/2020  Medication Sig Note   ACCU-CHEK GUIDE test strip USE AS DIRECTED    Accu-Chek Softclix Lancets lancets 1 each by Other route 2 (two) times daily. Use as instructed    aspirin 81 MG tablet Take 81 mg by mouth daily.    famotidine (PEPCID) 40 MG tablet Take 40 mg by mouth at bedtime.    Multiple Vitamins-Minerals (MULTIVITAMIN WITH MINERALS) tablet Take 1 tablet by mouth daily.    NOVOFINE PLUS 32G X 4 MM MISC USE DAILY    Omega-3 Fatty Acids (FISH OIL) 1000 MG CAPS Take 1 capsule by mouth daily.     omeprazole (PRILOSEC) 40 MG capsule TAKE 1  CAPSULE(40 MG) BY MOUTH IN THE MORNING AND AT BEDTIME (Patient taking differently: Take 40 mg by mouth in the morning. TAKE 1 CAPSULE(40 MG) BY MOUTH IN THE MORNING AND AT BEDTIME)    oxybutynin (DITROPAN) 5 MG tablet Take 5 mg by mouth 2 (two) times daily.     sildenafil (REVATIO) 20 MG tablet Take 2-5 tablets once daily as needed for erectile dysfunction    tamsulosin (FLOMAX) 0.4 MG CAPS capsule Take 1 capsule (0.4 mg total) by mouth 2 (two) times daily after a meal. For urinary urgency or weak stream. 02/21/2020: Ran out last week, got refill from urologist today.   [DISCONTINUED] hydrochlorothiazide (HYDRODIURIL) 25 MG tablet Take 1 tablet (25 mg total) by mouth daily.  TAKE 1 TABLET(25 MG) BY MOUTH DAILY    [DISCONTINUED] insulin degludec (TRESIBA FLEXTOUCH) 100 UNIT/ML FlexTouch Pen ADMINISTER 18 UNITS UNDER THE SKIN AT BEDTIME    [DISCONTINUED] losartan (COZAAR) 50 MG tablet TAKE 1 TABLET BY MOUTH DAILY    [DISCONTINUED] pioglitazone (ACTOS) 45 MG tablet TAKE 1 TABLET(45 MG) BY MOUTH DAILY    [DISCONTINUED] saxagliptin HCl (ONGLYZA) 2.5 MG TABS tablet TAKE 1 TABLET(2.5 MG) BY MOUTH DAILY    [DISCONTINUED] simvastatin (ZOCOR) 20 MG tablet TAKE 1 TABLET(20 MG) BY MOUTH AT BEDTIME    calcipotriene (DOVONOX) 0.005 % ointment  (Patient not taking: No sig reported)    clotrimazole (LOTRIMIN) 1 % cream Apply 1 application topically as needed. (Patient not taking: No sig reported)    fluorouracil (EFUDEX) 5 % cream  (Patient not taking: No sig reported)    hydrochlorothiazide (HYDRODIURIL) 25 MG tablet Take 1 tablet (25 mg total) by mouth daily. TAKE 1 TABLET(25 MG) BY MOUTH DAILY    insulin degludec (TRESIBA FLEXTOUCH) 100 UNIT/ML FlexTouch Pen ADMINISTER 18 UNITS UNDER THE SKIN AT BEDTIME    losartan (COZAAR) 50 MG tablet TAKE 1 TABLET BY MOUTH DAILY    pioglitazone (ACTOS) 45 MG tablet Take 1 tablet (45 mg total) by mouth daily.    saxagliptin HCl (ONGLYZA) 2.5 MG TABS tablet TAKE 1 TABLET(2.5 MG) BY MOUTH DAILY    simvastatin (ZOCOR) 20 MG tablet TAKE 1 TABLET(20 MG) BY MOUTH AT BEDTIME    valACYclovir (VALTREX) 1000 MG tablet Take 2,000 mg by mouth as needed (fever blister). Reported on 04/09/2015 (Patient not taking: No sig reported) 10/10/2014: From her dermatologist [facility]); prn fever blisters   No facility-administered encounter medications on file as of 08/21/2020.   Allergies  Allergen Reactions   Ace Inhibitors Cough    ROS: no fever, chills, headaches, dizziness, chest pain, shortness of breath. Denies depression, nausea, vomiting, bowel changes, vision changes, bleeding, bruising, rash. +R knee pain, no other joint pains. No edema in the  morning, some at the end of the day, at L ankle only Cough is much better per pt. Sees derm regularly    PHYSICAL EXAM:  BP 124/60   Pulse 60   Ht 6\' 3"  (1.905 m)   Wt 269 lb 12.8 oz (122.4 kg)   BMI 33.72 kg/m   Wt Readings from Last 3 Encounters:  08/21/20 269 lb 12.8 oz (122.4 kg)  02/21/20 270 lb 12.8 oz (122.8 kg)  08/09/19 280 lb (127 kg)    Well-appearing, pleasant male, in no distress.  Occasional throat-clearing during visit, no coughing.   HEENT: conjunctiva and sclera are clear, EOMI. Wearing mask Neck: no lymphadenopathy or mass, no bruit. Heart: regular rate and rhythm Lungs: clear, no wheezes, rales, ronchi Back:  no CVA tenderness or spinal tenderness Abdomen: soft, nontender, no mass Extremities: trace pretibial edema, symmetric. Neuro: alert and oriented, normal gait Psych: normal mood, affect, normal eye contact, grooming. Skin: no rashes, normal turgor  Lab Results  Component Value Date   HGBA1C 7.0 (H) 08/19/2020   Fasting glucose 104    Chemistry      Component Value Date/Time   NA 140 08/19/2020 0940   K 4.5 08/19/2020 0940   CL 101 08/19/2020 0940   CO2 24 08/19/2020 0940   BUN 27 08/19/2020 0940   CREATININE 1.92 (H) 08/19/2020 0940   CREATININE 1.73 (H) 02/02/2017 1205      Component Value Date/Time   CALCIUM 9.4 08/19/2020 0940   ALKPHOS 55 08/19/2020 0940   AST 15 08/19/2020 0940   ALT 17 08/19/2020 0940   BILITOT 0.3 08/19/2020 0940     Lab Results  Component Value Date   WBC 4.6 08/19/2020   HGB 12.3 (L) 08/19/2020   HCT 36.5 (L) 08/19/2020   MCV 97 08/19/2020   PLT 151 08/19/2020   Lab Results  Component Value Date   CHOL 104 08/19/2020   HDL 39 (L) 08/19/2020   LDLCALC 49 08/19/2020   TRIG 77 08/19/2020   CHOLHDL 2.7 08/19/2020   Lab Results  Component Value Date   [id number] 761 08/19/2020   Lab Results  Component Value Date   TSH 3.010 08/19/2020     ASSESSMENT/PLAN:  Controlled type 2 diabetes  mellitus with stage 3 chronic kidney disease, with long-term current use of insulin (HCC) - A1c 7--reviewed goals, proper diet--limit wine, carbs, wt loss.  Continue current meds.   - Plan: insulin degludec (TRESIBA FLEXTOUCH) 100 UNIT/ML FlexTouch Pen  Aortic atherosclerosis (HCC) - cont statin  Secondary hyperparathyroidism of renal origin ([place]) - noted/monitored by nephro  Anemia due to vitamin B12 deficiency, unspecified B12 deficiency type - adequately replaced with MVI  Essential hypertension, benign - controlled - Plan: hydrochlorothiazide (HYDRODIURIL) 25 MG tablet, losartan (COZAAR) 50 MG tablet  Type 2 diabetes with nephropathy ([place]) - Improved control; cont current meds, and dose of 18U tresiba. Periodically check glu in evenings. Diet/exercise reviewed - Plan: pioglitazone (ACTOS) 45 MG tablet, saxagliptin HCl (ONGLYZA) 2.5 MG TABS tablet  Dyslipidemia - cont statin and lowfat, low cholesterol diet - Plan: simvastatin (ZOCOR) 20 MG tablet  Need for COVID-19 vaccine - Plan: Moderna Covid-19 Booster  Patient reminded to get TdaP and Shingrix from pharmacy. We will call to get diabetic eye exam from 04/2020.  CPE 6 months Only needs A1c at visit (sees nephro, other labs all just done)

## 2020-08-21 ENCOUNTER — Encounter: Payer: Self-pay | Admitting: Family Medicine

## 2020-08-21 ENCOUNTER — Ambulatory Visit: Payer: Medicare PPO | Admitting: Family Medicine

## 2020-08-21 ENCOUNTER — Other Ambulatory Visit: Payer: Self-pay

## 2020-08-21 VITALS — BP 124/60 | HR 60 | Ht 75.0 in | Wt 269.8 lb

## 2020-08-21 DIAGNOSIS — D519 Vitamin B12 deficiency anemia, unspecified: Secondary | ICD-10-CM | POA: Diagnosis not present

## 2020-08-21 DIAGNOSIS — N183 Type 2 diabetes mellitus with diabetic chronic kidney disease: Secondary | ICD-10-CM

## 2020-08-21 DIAGNOSIS — E1121 Type 2 diabetes mellitus with diabetic nephropathy: Secondary | ICD-10-CM

## 2020-08-21 DIAGNOSIS — I10 Essential (primary) hypertension: Secondary | ICD-10-CM | POA: Diagnosis not present

## 2020-08-21 DIAGNOSIS — E1122 Type 2 diabetes mellitus with diabetic chronic kidney disease: Secondary | ICD-10-CM

## 2020-08-21 DIAGNOSIS — Z23 Encounter for immunization: Secondary | ICD-10-CM

## 2020-08-21 DIAGNOSIS — E785 Hyperlipidemia, unspecified: Secondary | ICD-10-CM

## 2020-08-21 DIAGNOSIS — N2581 Secondary hyperparathyroidism of renal origin: Secondary | ICD-10-CM

## 2020-08-21 DIAGNOSIS — I700 Atherosclerosis of aorta: Secondary | ICD-10-CM | POA: Diagnosis not present

## 2020-08-21 DIAGNOSIS — Z794 Long term (current) use of insulin: Secondary | ICD-10-CM

## 2020-08-21 MED ORDER — ONGLYZA 2.5 MG PO TABS
ORAL_TABLET | ORAL | 1 refills | Status: DC
Start: 2020-08-21 — End: 2021-03-09

## 2020-08-21 MED ORDER — LOSARTAN POTASSIUM 50 MG PO TABS: ORAL_TABLET | ORAL | 1 refills | Status: DC

## 2020-08-21 MED ORDER — TRESIBA FLEXTOUCH 100 UNIT/ML ~~LOC~~ SOPN: PEN_INJECTOR | SUBCUTANEOUS | 1 refills | Status: DC

## 2020-08-21 MED ORDER — HYDROCHLOROTHIAZIDE 25 MG PO TABS: 25.0000 mg | ORAL_TABLET | Freq: Every day | ORAL | 1 refills | Status: DC

## 2020-08-21 MED ORDER — PIOGLITAZONE HCL 45 MG PO TABS: 45.0000 mg | ORAL_TABLET | Freq: Every day | ORAL | 1 refills | Status: DC

## 2020-08-21 MED ORDER — SIMVASTATIN 20 MG PO TABS: ORAL_TABLET | ORAL | 1 refills | Status: DC

## 2020-08-21 NOTE — Patient Instructions (Addendum)
Periodically check your sugars (maybe twice a week) 2 hours after dinner or at bedtime.   Overall diabetes is well controlled (goal is UNDER A1c of 7%) Working to cut back some on portions, sugar and alcohol in the diet, continue daily exercise, and working to lose some of the weight at your waist should help you lead a healthier life.  No changes to medications.  Please get Tdap from the pharmacy, and also Shingrix (shingles vaccine). You need to wait 2 weeks from today (due to getting the COVID booster) before getting any other vaccines.

## 2020-08-24 ENCOUNTER — Other Ambulatory Visit: Payer: Self-pay | Admitting: Family Medicine

## 2020-08-24 DIAGNOSIS — E785 Hyperlipidemia, unspecified: Secondary | ICD-10-CM

## 2020-08-24 DIAGNOSIS — I10 Essential (primary) hypertension: Secondary | ICD-10-CM

## 2020-08-25 DIAGNOSIS — N183 Chronic kidney disease, stage 3 unspecified: Secondary | ICD-10-CM | POA: Diagnosis not present

## 2020-08-25 DIAGNOSIS — R350 Frequency of micturition: Secondary | ICD-10-CM | POA: Diagnosis not present

## 2020-08-25 DIAGNOSIS — C61 Malignant neoplasm of prostate: Secondary | ICD-10-CM | POA: Diagnosis not present

## 2020-08-25 DIAGNOSIS — N5201 Erectile dysfunction due to arterial insufficiency: Secondary | ICD-10-CM | POA: Diagnosis not present

## 2020-08-25 DIAGNOSIS — D4101 Neoplasm of uncertain behavior of right kidney: Secondary | ICD-10-CM | POA: Diagnosis not present

## 2020-08-26 ENCOUNTER — Encounter: Payer: Self-pay | Admitting: Family Medicine

## 2020-08-27 ENCOUNTER — Other Ambulatory Visit: Payer: Self-pay | Admitting: *Deleted

## 2020-08-27 MED ORDER — BD PEN NEEDLE NANO U/F 32G X 4 MM MISC: 1.0000 | 3 refills | Status: DC | PRN

## 2020-09-05 ENCOUNTER — Other Ambulatory Visit: Payer: Self-pay | Admitting: Family Medicine

## 2020-09-05 DIAGNOSIS — K219 Gastro-esophageal reflux disease without esophagitis: Secondary | ICD-10-CM

## 2020-10-14 ENCOUNTER — Other Ambulatory Visit (HOSPITAL_COMMUNITY): Payer: Self-pay | Admitting: Gastroenterology

## 2020-10-14 ENCOUNTER — Other Ambulatory Visit: Payer: Self-pay | Admitting: Gastroenterology

## 2020-10-14 DIAGNOSIS — K5730 Diverticulosis of large intestine without perforation or abscess without bleeding: Secondary | ICD-10-CM | POA: Diagnosis not present

## 2020-10-14 DIAGNOSIS — R1011 Right upper quadrant pain: Secondary | ICD-10-CM

## 2020-10-14 DIAGNOSIS — R112 Nausea with vomiting, unspecified: Secondary | ICD-10-CM | POA: Diagnosis not present

## 2020-10-14 DIAGNOSIS — K439 Ventral hernia without obstruction or gangrene: Secondary | ICD-10-CM | POA: Diagnosis not present

## 2020-10-14 DIAGNOSIS — K219 Gastro-esophageal reflux disease without esophagitis: Secondary | ICD-10-CM | POA: Diagnosis not present

## 2020-10-15 ENCOUNTER — Other Ambulatory Visit: Payer: Self-pay

## 2020-10-15 ENCOUNTER — Encounter (HOSPITAL_COMMUNITY): Payer: Self-pay | Admitting: Emergency Medicine

## 2020-10-15 ENCOUNTER — Emergency Department (HOSPITAL_COMMUNITY): Payer: Medicare PPO

## 2020-10-15 ENCOUNTER — Inpatient Hospital Stay (HOSPITAL_COMMUNITY)
Admission: EM | Admit: 2020-10-15 | Discharge: 2020-10-20 | DRG: 871 | Disposition: A | Discharge status: Skilled Nursing Facility | Payer: Medicare PPO | Attending: Family Medicine | Admitting: Family Medicine

## 2020-10-15 DIAGNOSIS — A419 Sepsis, unspecified organism: Principal | ICD-10-CM | POA: Diagnosis present

## 2020-10-15 DIAGNOSIS — N529 Male erectile dysfunction, unspecified: Secondary | ICD-10-CM | POA: Diagnosis present

## 2020-10-15 DIAGNOSIS — E785 Hyperlipidemia, unspecified: Secondary | ICD-10-CM | POA: Diagnosis present

## 2020-10-15 DIAGNOSIS — Z8546 Personal history of malignant neoplasm of prostate: Secondary | ICD-10-CM

## 2020-10-15 DIAGNOSIS — R188 Other ascites: Secondary | ICD-10-CM | POA: Diagnosis present

## 2020-10-15 DIAGNOSIS — N281 Cyst of kidney, acquired: Secondary | ICD-10-CM | POA: Diagnosis present

## 2020-10-15 DIAGNOSIS — K828 Other specified diseases of gallbladder: Secondary | ICD-10-CM | POA: Diagnosis not present

## 2020-10-15 DIAGNOSIS — R410 Disorientation, unspecified: Secondary | ICD-10-CM | POA: Diagnosis not present

## 2020-10-15 DIAGNOSIS — D509 Iron deficiency anemia, unspecified: Secondary | ICD-10-CM | POA: Diagnosis not present

## 2020-10-15 DIAGNOSIS — C61 Malignant neoplasm of prostate: Secondary | ICD-10-CM | POA: Diagnosis present

## 2020-10-15 DIAGNOSIS — E669 Obesity, unspecified: Secondary | ICD-10-CM | POA: Diagnosis present

## 2020-10-15 DIAGNOSIS — Z20822 Contact with and (suspected) exposure to covid-19: Secondary | ICD-10-CM | POA: Diagnosis present

## 2020-10-15 DIAGNOSIS — R509 Fever, unspecified: Secondary | ICD-10-CM | POA: Diagnosis not present

## 2020-10-15 DIAGNOSIS — I440 Atrioventricular block, first degree: Secondary | ICD-10-CM | POA: Diagnosis present

## 2020-10-15 DIAGNOSIS — E1165 Type 2 diabetes mellitus with hyperglycemia: Secondary | ICD-10-CM | POA: Diagnosis present

## 2020-10-15 DIAGNOSIS — G9341 Metabolic encephalopathy: Secondary | ICD-10-CM | POA: Diagnosis present

## 2020-10-15 DIAGNOSIS — D631 Anemia in chronic kidney disease: Secondary | ICD-10-CM | POA: Diagnosis present

## 2020-10-15 DIAGNOSIS — Z841 Family history of disorders of kidney and ureter: Secondary | ICD-10-CM

## 2020-10-15 DIAGNOSIS — Z888 Allergy status to other drugs, medicaments and biological substances status: Secondary | ICD-10-CM

## 2020-10-15 DIAGNOSIS — R4182 Altered mental status, unspecified: Secondary | ICD-10-CM | POA: Diagnosis not present

## 2020-10-15 DIAGNOSIS — Z7982 Long term (current) use of aspirin: Secondary | ICD-10-CM

## 2020-10-15 DIAGNOSIS — I700 Atherosclerosis of aorta: Secondary | ICD-10-CM | POA: Diagnosis present

## 2020-10-15 DIAGNOSIS — J90 Pleural effusion, not elsewhere classified: Secondary | ICD-10-CM | POA: Diagnosis present

## 2020-10-15 DIAGNOSIS — E876 Hypokalemia: Secondary | ICD-10-CM | POA: Diagnosis not present

## 2020-10-15 DIAGNOSIS — E1122 Type 2 diabetes mellitus with diabetic chronic kidney disease: Secondary | ICD-10-CM | POA: Diagnosis present

## 2020-10-15 DIAGNOSIS — R911 Solitary pulmonary nodule: Secondary | ICD-10-CM | POA: Diagnosis not present

## 2020-10-15 DIAGNOSIS — I10 Essential (primary) hypertension: Secondary | ICD-10-CM | POA: Diagnosis present

## 2020-10-15 DIAGNOSIS — N183 Chronic kidney disease, stage 3 unspecified: Secondary | ICD-10-CM | POA: Diagnosis not present

## 2020-10-15 DIAGNOSIS — Z6835 Body mass index (BMI) 35.0-35.9, adult: Secondary | ICD-10-CM | POA: Diagnosis not present

## 2020-10-15 DIAGNOSIS — K750 Abscess of liver: Secondary | ICD-10-CM | POA: Diagnosis present

## 2020-10-15 DIAGNOSIS — N400 Enlarged prostate without lower urinary tract symptoms: Secondary | ICD-10-CM | POA: Diagnosis present

## 2020-10-15 DIAGNOSIS — D696 Thrombocytopenia, unspecified: Secondary | ICD-10-CM | POA: Diagnosis present

## 2020-10-15 DIAGNOSIS — E872 Acidosis: Secondary | ICD-10-CM | POA: Diagnosis present

## 2020-10-15 DIAGNOSIS — K5730 Diverticulosis of large intestine without perforation or abscess without bleeding: Secondary | ICD-10-CM | POA: Diagnosis not present

## 2020-10-15 DIAGNOSIS — N1831 Chronic kidney disease, stage 3a: Secondary | ICD-10-CM | POA: Diagnosis present

## 2020-10-15 DIAGNOSIS — Z794 Long term (current) use of insulin: Secondary | ICD-10-CM | POA: Diagnosis not present

## 2020-10-15 DIAGNOSIS — R404 Transient alteration of awareness: Secondary | ICD-10-CM | POA: Diagnosis not present

## 2020-10-15 DIAGNOSIS — R739 Hyperglycemia, unspecified: Secondary | ICD-10-CM | POA: Diagnosis not present

## 2020-10-15 DIAGNOSIS — K810 Acute cholecystitis: Secondary | ICD-10-CM | POA: Diagnosis present

## 2020-10-15 DIAGNOSIS — J9811 Atelectasis: Secondary | ICD-10-CM | POA: Diagnosis not present

## 2020-10-15 DIAGNOSIS — K219 Gastro-esophageal reflux disease without esophagitis: Secondary | ICD-10-CM | POA: Diagnosis present

## 2020-10-15 DIAGNOSIS — Z833 Family history of diabetes mellitus: Secondary | ICD-10-CM

## 2020-10-15 DIAGNOSIS — E1159 Type 2 diabetes mellitus with other circulatory complications: Secondary | ICD-10-CM | POA: Diagnosis not present

## 2020-10-15 DIAGNOSIS — R7881 Bacteremia: Secondary | ICD-10-CM | POA: Diagnosis not present

## 2020-10-15 DIAGNOSIS — Z79899 Other long term (current) drug therapy: Secondary | ICD-10-CM

## 2020-10-15 DIAGNOSIS — D4101 Neoplasm of uncertain behavior of right kidney: Secondary | ICD-10-CM | POA: Diagnosis present

## 2020-10-15 DIAGNOSIS — I129 Hypertensive chronic kidney disease with stage 1 through stage 4 chronic kidney disease, or unspecified chronic kidney disease: Secondary | ICD-10-CM | POA: Diagnosis present

## 2020-10-15 DIAGNOSIS — Z8249 Family history of ischemic heart disease and other diseases of the circulatory system: Secondary | ICD-10-CM

## 2020-10-15 DIAGNOSIS — I499 Cardiac arrhythmia, unspecified: Secondary | ICD-10-CM | POA: Diagnosis not present

## 2020-10-15 LAB — COMPREHENSIVE METABOLIC PANEL
ALT: 66 U/L — ABNORMAL HIGH (ref 0–44)
AST: 67 U/L — ABNORMAL HIGH (ref 15–41)
Albumin: 3 g/dL — ABNORMAL LOW (ref 3.5–5.0)
Alkaline Phosphatase: 41 U/L (ref 38–126)
Anion gap: 11 (ref 5–15)
BUN: 40 mg/dL — ABNORMAL HIGH (ref 8–23)
CO2: 22 mmol/L (ref 22–32)
Calcium: 8.1 mg/dL — ABNORMAL LOW (ref 8.9–10.3)
Chloride: 100 mmol/L (ref 98–111)
Creatinine, Ser: 1.84 mg/dL — ABNORMAL HIGH (ref 0.61–1.24)
GFR, Estimated: 38 mL/min — ABNORMAL LOW (ref >60)
Glucose, Bld: 269 mg/dL — ABNORMAL HIGH (ref 70–99)
Potassium: 3.7 mmol/L (ref 3.5–5.1)
Sodium: 133 mmol/L — ABNORMAL LOW (ref 135–145)
Total Bilirubin: 1.3 mg/dL — ABNORMAL HIGH (ref 0.3–1.2)
Total Protein: 6.3 g/dL — ABNORMAL LOW (ref 6.5–8.1)

## 2020-10-15 LAB — CBC WITH DIFFERENTIAL/PLATELET
Abs Immature Granulocytes: 0.27 10*3/uL — ABNORMAL HIGH (ref 0.00–0.07)
Basophils Absolute: 0.1 10*3/uL (ref 0.0–0.1)
Basophils Relative: 0 %
Eosinophils Absolute: 0 10*3/uL (ref 0.0–0.5)
Eosinophils Relative: 0 %
HCT: 36.7 % — ABNORMAL LOW (ref 39.0–52.0)
Hemoglobin: 12 g/dL — ABNORMAL LOW (ref 13.0–17.0)
Immature Granulocytes: 1 %
Lymphocytes Relative: 1 %
Lymphs Abs: 0.3 10*3/uL — ABNORMAL LOW (ref 0.7–4.0)
MCH: 33 pg (ref 26.0–34.0)
MCHC: 32.7 g/dL (ref 30.0–36.0)
MCV: 100.8 fL — ABNORMAL HIGH (ref 80.0–100.0)
Monocytes Absolute: 1 10*3/uL (ref 0.1–1.0)
Monocytes Relative: 5 %
Neutro Abs: 18.6 10*3/uL — ABNORMAL HIGH (ref 1.7–7.7)
Neutrophils Relative %: 93 %
Platelets: 126 10*3/uL — ABNORMAL LOW (ref 150–400)
RBC: 3.64 MIL/uL — ABNORMAL LOW (ref 4.22–5.81)
RDW: 13.5 % (ref 11.5–15.5)
WBC: 20.3 10*3/uL — ABNORMAL HIGH (ref 4.0–10.5)
nRBC: 0 % (ref 0.0–0.2)

## 2020-10-15 LAB — PROTIME-INR
INR: 1.4 — ABNORMAL HIGH (ref 0.8–1.2)
Prothrombin Time: 16.9 seconds — ABNORMAL HIGH (ref 11.4–15.2)

## 2020-10-15 LAB — APTT: aPTT: 38 seconds — ABNORMAL HIGH (ref 24–36)

## 2020-10-15 LAB — LACTIC ACID, PLASMA
Lactic Acid, Venous: 2 mmol/L (ref 0.5–1.9)
Lactic Acid, Venous: 2.2 mmol/L (ref 0.5–1.9)

## 2020-10-15 LAB — RESP PANEL BY RT-PCR (FLU A&B, COVID) ARPGX2
Influenza A by PCR: NEGATIVE
Influenza B by PCR: NEGATIVE
SARS Coronavirus 2 by RT PCR: NEGATIVE

## 2020-10-15 MED ORDER — LACTATED RINGERS IV SOLN: INTRAVENOUS | Status: AC

## 2020-10-15 MED ORDER — VANCOMYCIN HCL 2000 MG/400ML IV SOLN
2000.0000 mg | Freq: Once | INTRAVENOUS | Status: AC
  Administered 2020-10-15: 2000 mg via INTRAVENOUS
  Filled 2020-10-15: qty 400

## 2020-10-15 MED ORDER — LACTATED RINGERS IV BOLUS (SEPSIS)
1000.0000 mL | Freq: Once | INTRAVENOUS | Status: AC
  Administered 2020-10-15: 1000 mL via INTRAVENOUS

## 2020-10-15 MED ORDER — METRONIDAZOLE 500 MG/100ML IV SOLN
500.0000 mg | Freq: Once | INTRAVENOUS | Status: AC
  Administered 2020-10-15: 500 mg via INTRAVENOUS
  Filled 2020-10-15: qty 100

## 2020-10-15 MED ORDER — VANCOMYCIN HCL IN DEXTROSE 1-5 GM/200ML-% IV SOLN
1000.0000 mg | Freq: Once | INTRAVENOUS | Status: DC
  Filled 2020-10-15: qty 200

## 2020-10-15 MED ORDER — LACTATED RINGERS IV BOLUS (SEPSIS)
1000.0000 mL | Freq: Once | INTRAVENOUS | Status: AC
  Administered 2020-10-16: 1000 mL via INTRAVENOUS

## 2020-10-15 MED ORDER — SODIUM CHLORIDE 0.9 % IV SOLN
2.0000 g | Freq: Once | INTRAVENOUS | Status: AC
  Administered 2020-10-15: 2 g via INTRAVENOUS
  Filled 2020-10-15: qty 2

## 2020-10-15 MED ORDER — IOHEXOL 300 MG/ML  SOLN
75.0000 mL | Freq: Once | INTRAMUSCULAR | Status: AC | PRN
  Administered 2020-10-15: 75 mL via INTRAVENOUS

## 2020-10-15 NOTE — ED Notes (Addendum)
Pt spouse says she does not want pt to be stuck again for another additional IV

## 2020-10-15 NOTE — ED Provider Notes (Addendum)
[facility] EMERGENCY DEPARTMENT Provider Note   CSN: [id number] Arrival date & time: 10/15/20  1939     History Chief Complaint  Patient presents with   Altered Mental Status    James Glover is a 75 y.o. male.  Patient brought in by EMS for fatigue some vomiting this morning abdominal pain and a little bit of confusion.  Patient was being seen by primary care provider and sounds like maybe gastroenterology for concerns for gallstones.  And they have ordered a ultrasound for later in the week and were considering a HIDA scan.  Patient arrives here with a temp of 100.2 respiratory rate of 29 blood pressure actually elevated 173/65 oxygen sats 95%.  Patient's initial labs lactic acid was 2.0.  White count of 20,000.  This triggered concerns for sepsis so patient had sepsis protocol initiated and got broad-spectrum antibiotics.  Patient denies any abdominal pain currently.  It is nontender on exam currently.  But did say that he did have pain in his abdomen earlier.      Past Medical History:  Diagnosis Date   CKD (chronic kidney disease), stage III [facility])    nephrologist-- [place]--- per lov note 03-08-2018 in epic, stable (baseline Cr 1.5 - 2)   Dyslipidemia    Erectile dysfunction    First degree heart block    GERD (gastroesophageal reflux disease)    Hemorrhoids    internal and external   History of nuclear stress test    05-26-2009 (by dr Rollene Fare due to HTN and DM2)--- normal study w/ no ischemia,  normal LV function and wall motion , ef 70%   Hyperplasia of prostate with lower urinary tract symptoms (LUTS)    Hypertension    Hypogonadism male    Iron deficiency    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 Tresa Moore   Prostate cancer [facility]) urologist-  dr t. Tresa Moore  oncologsit-  dr Tammi Klippel   dx 05-30-2018-- Stage T1c, Gleason 4+5, High Risk   Renal cyst, left    followed by dr t. Tresa Moore--  chronic noncomplex   Type 2 diabetes mellitus ([place])     followed by pcp   Wears partial dentures    upper    Patient Active Problem List   Diagnosis Date Noted   Aortic atherosclerosis ([place]) 02/24/2020   Controlled type 2 diabetes mellitus with stage 3 chronic kidney disease, with long-term current use of insulin ([place]) 02/20/2020   Secondary hyperparathyroidism of renal origin ([place]) 02/20/2020   Anemia due to vitamin B12 deficiency 08/08/2019   Prostate cancer ([place]) 06/14/2018   CKD (chronic kidney disease) stage 3, GFR 30-59 ml/min (HCC) 02/01/2018   Type II diabetes mellitus with nephropathy ([place]) 02/15/2013   HTN (hypertension) 01/27/2013   Chronic renal insufficiency, stage II (mild) 01/27/2013   Obesity (BMI 30.0-34.9) 10/02/2012   Uncontrolled type II diabetes mellitus with nephropathy ([place]) 06/21/2012   Type II or unspecified type diabetes mellitus without mention of complication, not stated as uncontrolled 07/29/2011   Essential hypertension, benign 07/29/2011   Male hypogonadism 07/29/2011   Dyslipidemia 07/29/2011    Past Surgical History:  Procedure Laterality Date   CIRCUMCISION  age 78   CYSTOSCOPY N/A 11/03/2018   Procedure: CYSTOSCOPY FLEXIBLE;  Surgeon: Alexis Frock, MD;  Location: [facility];  Service: Urology;  Laterality: N/A;  NO SEEDS FOUND IN BLADDER   KNEE ARTHROSCOPY Right 11/14/2017   dr Stann Mainland '@[email or web address]'    RADIOACTIVE  SEED IMPLANT N/A 11/03/2018   Procedure: RADIOACTIVE SEED IMPLANT/BRACHYTHERAPY IMPLANT;  Surgeon: Alexis Frock, MD;  Location: [facility];  Service: Urology;  Laterality: N/A;       SEEDS IMPLANTED   SPACE OAR INSTILLATION N/A 11/03/2018   Procedure: SPACE OAR INSTILLATION;  Surgeon: Alexis Frock, MD;  Location: [facility];  Service: Urology;  Laterality: N/A;       Family History  Problem Relation Age of Onset   Lymphoma Father    Cancer Father        lymphoma   Hypertension Father    Lymphoma Brother    Cancer Brother        lymphoma    Dementia Mother    Diabetes Mother    Diabetes Sister    Cancer Brother        skin cancer   Diabetes Brother    Lymphoma Paternal Grandfather    Cancer Paternal Grandfather        lymphoma   Diabetes Brother    Kidney disease Brother    COPD Brother    Heart disease Neg Hx     Social History   Tobacco Use   Smoking status: Never   Smokeless tobacco: Never  Vaping Use   Vaping Use: Never used  Substance Use Topics   Alcohol use: Yes    Alcohol/week: 3.0 standard drinks    Types: 3 Glasses of wine per week    Comment: 3 glasses of wine per week   Drug use: Never    Home Medications Prior to Admission medications   Medication Sig Start Date End Date Taking? Authorizing Provider  aspirin 81 MG tablet Take 81 mg by mouth daily.   Yes [provider]  famotidine (PEPCID) 40 MG tablet Take 40 mg by mouth at bedtime.   Yes [provider]  fluorouracil (EFUDEX) 5 % cream  07/11/19  Yes [provider]  hydrochlorothiazide (HYDRODIURIL) 25 MG tablet Take 1 tablet (25 mg total) by mouth daily. TAKE 1 TABLET(25 MG) BY MOUTH DAILY 08/21/20  Yes [name], MD  insulin degludec (TRESIBA FLEXTOUCH) 100 UNIT/ML FlexTouch Pen ADMINISTER 18 UNITS UNDER THE SKIN AT BEDTIME 08/21/20  Yes [name], MD  losartan (COZAAR) 50 MG tablet TAKE 1 TABLET BY MOUTH DAILY 08/21/20  Yes [name], MD  Multiple Vitamins-Minerals (MULTIVITAMIN WITH MINERALS) tablet Take 1 tablet by mouth daily.   Yes [provider]  Omega-3 Fatty Acids (FISH OIL) 1000 MG CAPS Take 1 capsule by mouth 2 (two) times daily.   Yes [provider]  omeprazole (PRILOSEC) 40 MG capsule TAKE 1 CAPSULE(40 MG) BY MOUTH IN THE MORNING AND AT BEDTIME 09/05/20  Yes [name], MD  oxybutynin (DITROPAN) 5 MG tablet Take 5 mg by mouth 2 (two) times daily.  07/30/19  Yes [provider]  pioglitazone (ACTOS) 45 MG tablet Take 1 tablet (45 mg total) by mouth daily. 08/21/20  Yes [name],  MD  saxagliptin HCl (ONGLYZA) 2.5 MG TABS tablet TAKE 1 TABLET(2.5 MG) BY MOUTH DAILY 08/21/20  Yes [name], MD  simvastatin (ZOCOR) 20 MG tablet TAKE 1 TABLET(20 MG) BY MOUTH AT BEDTIME 08/21/20  Yes [name], MD  tamsulosin (FLOMAX) 0.4 MG CAPS capsule Take 1 capsule (0.4 mg total) by mouth 2 (two) times daily after a meal. For urinary urgency or weak stream. 02/08/19  Yes Bruning, Ashlyn, PA-C  valACYclovir (VALTREX) 1000 MG tablet Take 2,000 mg by mouth as  needed (fever blister). Reported on 04/09/2015 10/08/14  Yes [provider]  ACCU-CHEK GUIDE test strip USE AS DIRECTED 07/17/20   [name], MD  Accu-Chek Softclix Lancets lancets 1 each by Other route 2 (two) times daily. Use as instructed 03/29/19   [name], MD  calcipotriene (DOVONOX) 0.005 % ointment  07/12/19   [provider]  clotrimazole (LOTRIMIN) 1 % cream Apply 1 application topically as needed. Patient not taking: No sig reported    [provider]  Insulin Pen Needle (BD PEN NEEDLE NANO U/F) 32G X 4 MM MISC 1 each by Does not apply route as needed. 08/27/20   [name], MD  sildenafil (REVATIO) 20 MG tablet Take 2-5 tablets once daily as needed for erectile dysfunction Patient not taking: Reported on 10/15/2020 02/02/18   [name], MD  traMADol Veatrice [place]) 50 MG tablet SMARTSIG:1 Tablet(s) By Mouth Every 12 Hours PRN Patient not taking: Reported on 10/15/2020 10/14/20   [provider]    Allergies    Ace inhibitors  Review of Systems   Review of Systems  Constitutional:  Negative for chills and fever.  HENT:  Negative for ear pain and sore throat.   Eyes:  Negative for pain and visual disturbance.  Respiratory:  Negative for cough and shortness of breath.   Cardiovascular:  Negative for chest pain and palpitations.  Gastrointestinal:  Positive for abdominal pain, nausea and vomiting.  Genitourinary:  Negative for dysuria and hematuria.  Musculoskeletal:  Negative for arthralgias and  back pain.  Skin:  Negative for color change and rash.  Neurological:  Negative for seizures and syncope.  Psychiatric/Behavioral:  Positive for confusion.   All other systems reviewed and are negative.  Physical Exam Updated Vital Signs BP (!) 151/45   Pulse 84   Temp 100.2 F (37.9 C) (Oral)   Resp (!) 33   Ht 1.905 m ('6\' 3"' )   Wt 122.4 kg   SpO2 91%   BMI 33.73 kg/m   Physical Exam Vitals and nursing note reviewed.  Constitutional:      General: He is in acute distress.     Appearance: Normal appearance. He is well-developed.  HENT:     Head: Normocephalic and atraumatic.  Eyes:     Extraocular Movements: Extraocular movements intact.     Conjunctiva/sclera: Conjunctivae normal.     Pupils: Pupils are equal, round, and reactive to light.  Cardiovascular:     Rate and Rhythm: Normal rate and regular rhythm.     Heart sounds: No murmur heard. Pulmonary:     Effort: Pulmonary effort is normal. No respiratory distress.     Breath sounds: Normal breath sounds.  Abdominal:     General: There is distension.     Palpations: Abdomen is soft.     Tenderness: There is no abdominal tenderness. There is no guarding.     Comments: No tenderness no guarding definitely no tenderness to palpation right upper quadrant.  Musculoskeletal:        General: No swelling.     Cervical back: Neck supple.  Skin:    General: Skin is warm and dry.     Capillary Refill: Capillary refill takes less than 2 seconds.  Neurological:     General: No focal deficit present.     Mental Status: He is alert and oriented to person, place, and time.     Cranial Nerves: No cranial nerve deficit.     Sensory: No sensory deficit.  Motor: No weakness.     Comments: Some degree of confusion.  But does is oriented to place name age.    ED Results / Procedures / Treatments   Labs (all labs ordered are listed, but only abnormal results are displayed) Labs Reviewed  COMPREHENSIVE METABOLIC PANEL -  Abnormal; Notable for the following components:      Result Value   Sodium 133 (*)    Glucose, Bld 269 (*)    BUN 40 (*)    Creatinine, Ser 1.84 (*)    Calcium 8.1 (*)    Total Protein 6.3 (*)    Albumin 3.0 (*)    AST 67 (*)    ALT 66 (*)    Total Bilirubin 1.3 (*)    GFR, Estimated 38 (*)    All other components within normal limits  LACTIC ACID, PLASMA - Abnormal; Notable for the following components:   Lactic Acid, Venous 2.0 (*)    All other components within normal limits  LACTIC ACID, PLASMA - Abnormal; Notable for the following components:   Lactic Acid, Venous 2.2 (*)    All other components within normal limits  CBC WITH DIFFERENTIAL/PLATELET - Abnormal; Notable for the following components:   WBC 20.3 (*)    RBC 3.64 (*)    Hemoglobin 12.0 (*)    HCT 36.7 (*)    MCV 100.8 (*)    Platelets 126 (*)    Neutro Abs 18.6 (*)    Lymphs Abs 0.3 (*)    Abs Immature Granulocytes 0.27 (*)    All other components within normal limits  PROTIME-INR - Abnormal; Notable for the following components:   Prothrombin Time 16.9 (*)    INR 1.4 (*)    All other components within normal limits  APTT - Abnormal; Notable for the following components:   aPTT 38 (*)    All other components within normal limits  CULTURE, BLOOD (ROUTINE X 2)  CULTURE, BLOOD (ROUTINE X 2)  RESP PANEL BY RT-PCR (FLU A&B, COVID) ARPGX2  URINE CULTURE  URINALYSIS, ROUTINE W REFLEX MICROSCOPIC    EKG EKG Interpretation  Date/Time:  Wednesday October 15 2020 19:43:58 EDT Ventricular Rate:  79 PR Interval:  197 QRS Duration: 100 QT Interval:  394 QTC Calculation: 452 R Axis:   -33 Text Interpretation: Sinus rhythm Ventricular trigeminy RSR' in V1 or V2, probably normal variant Inferior infarct, old Consider anterior infarct Confirmed by Fredia Sorrow [phone]) on 10/15/2020 9:33:24 PM  Radiology CT Chest W Contrast  Result Date: 10/15/2020 CLINICAL DATA:  Pneumonia, effusion or abscess suspected,  xray done; Abdominal pain, acute, nonlocalized EXAM: CT CHEST, ABDOMEN, AND PELVIS WITH CONTRAST TECHNIQUE: Multidetector CT imaging of the chest, abdomen and pelvis was performed following the standard protocol during bolus administration of intravenous contrast. CONTRAST:  20m OMNIPAQUE IOHEXOL 300 MG/ML  SOLN COMPARISON:  Chest radiograph earlier today. Abdominopelvic CT 08/15/2020 FINDINGS: CT CHEST FINDINGS Cardiovascular: Atherosclerosis of the thoracic aorta. Upper normal heart size. There are coronary artery calcifications. No pericardial effusion. Mediastinum/Nodes: No enlarged mediastinal or hilar lymph nodes. Patulous esophagus without wall thickening. No thyroid nodule. Lungs/Pleura: Small bilateral pleural effusions. Associated bibasilar atelectasis. No evidence of pneumonia. Breathing motion artifact limits detailed assessment. The previous 3 mm right lower lobe pulmonary nodule is obscured by atelectasis on the current exam. No findings of pulmonary edema. Musculoskeletal: Small sclerotic focus in the left glenoid, nonspecific. No acute osseous abnormalities. CT ABDOMEN PELVIS FINDINGS Hepatobiliary: The gallbladder is distended. Suspect pericholecystic  fat stranding, however there is motion artifact through the gallbladder. There is mild generalized fat stranding in the right upper quadrant. Abnormal appearance of the liver adjacent to the gallbladder fossa with lobulated low-density that extends up to 12 mm into the adjacent liver parenchyma for example series 4, image 62. Suspect small subcapsular collection about the inferior liver tip measuring 4 x 1.7 cm, series 2, image 62, with additional small volume perihepatic ascites. Common bile duct is poorly defined, but not definitively distended. No definitive calcified gallstone. Pancreas: Atrophic. No definite peripancreatic fat stranding. No ductal dilatation. Spleen: Normal in size without focal abnormality. Adrenals/Urinary Tract: No adrenal  nodule. There is early excretion of IV contrast in both renal collecting systems. No hydronephrosis. Limited assessment for renal calculi given contrast in the collecting systems. Cyst in the lateral left kidney, similar in appearance to prior. Hyperdense lesion in the mid right kidney is not well-defined on the current exam. Excreted IV contrast in the urinary bladder which is partially distended. Equivocal perivesicular fat stranding. Stomach/Bowel: The stomach is decompressed. There is fat stranding in the right upper quadrant adjacent to the distal stomach and proximal duodenum, detailed assessment is obscured by motion. Motion limits assessment for wall thickening in this region. There is a duodenal diverticulum. Fluid-filled nondilated distal small bowel. No obstruction. Normal appendix. Small to moderate volume of colonic stool. Left colonic diverticulosis. No diverticulitis. Vascular/Lymphatic: Aortic atherosclerosis. No aortic aneurysm. The portal vein is patent. There is no portal venous or mesenteric gas. Limited assessment for upper abdominal adenopathy due to motion. There is no retroperitoneal or pelvic adenopathy. Reproductive: Brachytherapy seeds in the prostate. Other: Fat stranding and inflammatory changes in the right upper quadrant. Small amount of perihepatic ascites. Questionable subcapsular collection is described. There is no free intra-abdominal air. No abdominal wall hernia. Musculoskeletal: Punctate bone island in the L5, unchanged. Lucent lesion within L1 vertebral body and pedicle is not significantly changed. Mixed density lesion in the right iliac bone near the sacroiliac joint is also stable. IMPRESSION: 1. Small bilateral pleural effusions with bibasilar atelectasis. 2. Aortic atherosclerosis is well as coronary artery calcifications. 3. Gallbladder distension. There is generalized fat stranding in the right upper quadrant the may be secondary to gallbladder inflammation. Motion  artifact limits detailed assessment. Abnormal appearance of the liver and a adjacent to the gallbladder fossa, not seen on prior exams. This may represent extension of inflammatory changes or invasion. Suspected small subcapsular collection at the inferior liver tip measuring 4 x 1.7 cm. Small volume perihepatic ascites. Overall findings suggest complicated cholecystitis. Suggest initial assessment with ultrasound. While MRCP may be considered to further delineate the hepatic biliary changes, given inability to hold still for CT, MRI is not recommended at this time. 4. Colonic diverticulosis without diverticulitis. Additional stable chronic findings as described Aortic Atherosclerosis (ICD10-I70.0). Electronically Signed   By: Keith Rake M.D.   On: 10/15/2020 23:18   CT Abdomen Pelvis W Contrast  Result Date: 10/15/2020 CLINICAL DATA:  Pneumonia, effusion or abscess suspected, xray done; Abdominal pain, acute, nonlocalized EXAM: CT CHEST, ABDOMEN, AND PELVIS WITH CONTRAST TECHNIQUE: Multidetector CT imaging of the chest, abdomen and pelvis was performed following the standard protocol during bolus administration of intravenous contrast. CONTRAST:  72m OMNIPAQUE IOHEXOL 300 MG/ML  SOLN COMPARISON:  Chest radiograph earlier today. Abdominopelvic CT 08/15/2020 FINDINGS: CT CHEST FINDINGS Cardiovascular: Atherosclerosis of the thoracic aorta. Upper normal heart size. There are coronary artery calcifications. No pericardial effusion. Mediastinum/Nodes: No enlarged mediastinal or hilar  lymph nodes. Patulous esophagus without wall thickening. No thyroid nodule. Lungs/Pleura: Small bilateral pleural effusions. Associated bibasilar atelectasis. No evidence of pneumonia. Breathing motion artifact limits detailed assessment. The previous 3 mm right lower lobe pulmonary nodule is obscured by atelectasis on the current exam. No findings of pulmonary edema. Musculoskeletal: Small sclerotic focus in the left glenoid,  nonspecific. No acute osseous abnormalities. CT ABDOMEN PELVIS FINDINGS Hepatobiliary: The gallbladder is distended. Suspect pericholecystic fat stranding, however there is motion artifact through the gallbladder. There is mild generalized fat stranding in the right upper quadrant. Abnormal appearance of the liver adjacent to the gallbladder fossa with lobulated low-density that extends up to 12 mm into the adjacent liver parenchyma for example series 4, image 62. Suspect small subcapsular collection about the inferior liver tip measuring 4 x 1.7 cm, series 2, image 62, with additional small volume perihepatic ascites. Common bile duct is poorly defined, but not definitively distended. No definitive calcified gallstone. Pancreas: Atrophic. No definite peripancreatic fat stranding. No ductal dilatation. Spleen: Normal in size without focal abnormality. Adrenals/Urinary Tract: No adrenal nodule. There is early excretion of IV contrast in both renal collecting systems. No hydronephrosis. Limited assessment for renal calculi given contrast in the collecting systems. Cyst in the lateral left kidney, similar in appearance to prior. Hyperdense lesion in the mid right kidney is not well-defined on the current exam. Excreted IV contrast in the urinary bladder which is partially distended. Equivocal perivesicular fat stranding. Stomach/Bowel: The stomach is decompressed. There is fat stranding in the right upper quadrant adjacent to the distal stomach and proximal duodenum, detailed assessment is obscured by motion. Motion limits assessment for wall thickening in this region. There is a duodenal diverticulum. Fluid-filled nondilated distal small bowel. No obstruction. Normal appendix. Small to moderate volume of colonic stool. Left colonic diverticulosis. No diverticulitis. Vascular/Lymphatic: Aortic atherosclerosis. No aortic aneurysm. The portal vein is patent. There is no portal venous or mesenteric gas. Limited assessment  for upper abdominal adenopathy due to motion. There is no retroperitoneal or pelvic adenopathy. Reproductive: Brachytherapy seeds in the prostate. Other: Fat stranding and inflammatory changes in the right upper quadrant. Small amount of perihepatic ascites. Questionable subcapsular collection is described. There is no free intra-abdominal air. No abdominal wall hernia. Musculoskeletal: Punctate bone island in the L5, unchanged. Lucent lesion within L1 vertebral body and pedicle is not significantly changed. Mixed density lesion in the right iliac bone near the sacroiliac joint is also stable. IMPRESSION: 1. Small bilateral pleural effusions with bibasilar atelectasis. 2. Aortic atherosclerosis is well as coronary artery calcifications. 3. Gallbladder distension. There is generalized fat stranding in the right upper quadrant the may be secondary to gallbladder inflammation. Motion artifact limits detailed assessment. Abnormal appearance of the liver and a adjacent to the gallbladder fossa, not seen on prior exams. This may represent extension of inflammatory changes or invasion. Suspected small subcapsular collection at the inferior liver tip measuring 4 x 1.7 cm. Small volume perihepatic ascites. Overall findings suggest complicated cholecystitis. Suggest initial assessment with ultrasound. While MRCP may be considered to further delineate the hepatic biliary changes, given inability to hold still for CT, MRI is not recommended at this time. 4. Colonic diverticulosis without diverticulitis. Additional stable chronic findings as described Aortic Atherosclerosis (ICD10-I70.0). Electronically Signed   By: Keith Rake M.D.   On: 10/15/2020 23:18   DG Chest Port 1 View  Result Date: 10/15/2020 CLINICAL DATA:  Fever and altered mental status. EXAM: PORTABLE CHEST 1 VIEW COMPARISON:  Chest  radiograph dated 10/05/2018. FINDINGS: Shallow inspiration. Small left pleural effusion and left lung base atelectasis.  Infiltrate is not excluded clinical correlation recommended. There is no pneumothorax. The cardiac silhouette is within normal limits. No acute osseous pathology. IMPRESSION: Small left pleural effusion and left lung base atelectasis versus infiltrate. Electronically Signed   By: Anner Crete M.D.   On: 10/15/2020 20:19    Procedures Procedures   Medications Ordered in ED Medications  lactated ringers infusion (0 mLs Intravenous Hold 10/15/20 2205)  lactated ringers bolus 1,000 mL (1,000 mLs Intravenous New Bag/Given 10/15/20 2156)    And  lactated ringers bolus 1,000 mL (1,000 mLs Intravenous New Bag/Given 10/15/20 2313)    And  lactated ringers bolus 1,000 mL (1,000 mLs Intravenous New Bag/Given 10/15/20 2308)    And  lactated ringers bolus 1,000 mL (has no administration in time range)  vancomycin (VANCOREADY) IVPB 2000 mg/400 mL (2,000 mg Intravenous New Bag/Given 10/15/20 2313)  ceFEPIme (MAXIPIME) 2 g in sodium chloride 0.9 % 100 mL IVPB (has no administration in time range)  vancomycin (VANCOREADY) IVPB 1250 mg/250 mL (has no administration in time range)  ceFEPIme (MAXIPIME) 2 g in sodium chloride 0.9 % 100 mL IVPB (0 g Intravenous Stopped 10/15/20 2301)  metroNIDAZOLE (FLAGYL) IVPB 500 mg (0 mg Intravenous Stopped 10/15/20 2301)  iohexol (OMNIPAQUE) 300 MG/ML solution 75 mL (75 mLs Intravenous Contrast Given 10/15/20 2226)    ED Course  I have reviewed the triage vital signs and the nursing notes.  Pertinent labs & imaging results that were available during my care of the patient were reviewed by me and considered in my medical decision making (see chart for details).    MDM Rules/Calculators/A&P                         CRITICAL CARE Performed by: Fredia Sorrow Total critical care time: 60 minutes Critical care time was exclusive of separately billable procedures and treating other patients. Critical care was necessary to treat or prevent imminent or life-threatening  deterioration. Critical care was time spent personally by me on the following activities: development of treatment plan with patient and/or surrogate as well as nursing, discussions with consultants, evaluation of patient's response to treatment, examination of patient, obtaining history from patient or surrogate, ordering and performing treatments and interventions, ordering and review of laboratory studies, ordering and review of radiographic studies, pulse oximetry and re-evaluation of patient's condition.   As mentioned patient met sepsis criteria due to the elevated white blood cell count fever elevated respiratory rate.  Not tachycardic not hypotensive.  And also had a lactic acid of 2.  Patient treated with broad-spectrum antibiotics as well as a 30 cc/kg fluid challenge.  Chest x-ray raise some concern about pneumonia.  So patient had CT chest CT abdomen with contrast.  CT chest without any acute findings.  CT abdomen raises a lot of concern about gallbladder distention and fluid around the gallbladder some questionable fluid outside the gallbladder.  Discussed with Dr. Constance Haw general surgery is recommending admission to medicine antibiotics geared towards the gallbladder cholangitis.  And repeat liver function test in the morning and ultrasound right upper quadrant and she will see in consultation.  Discussed with hospitalist who will admit.  Patient's liver function test total bili is elevated compared to before.  Alk phos is normal.   Final Clinical Impression(s) / ED Diagnoses Final diagnoses:  Acute cholecystitis    Rx / DC Orders ED  Discharge Orders     None        Fredia Sorrow, MD 10/16/20 [id number]    Fredia Sorrow, MD 10/16/20 [phone]

## 2020-10-15 NOTE — ED Notes (Signed)
Patient transported to CT 

## 2020-10-15 NOTE — ED Triage Notes (Signed)
Pt arrives from home after wife called for AMS and fatigue that started this morning. Pt seen by PCP yesterday for gallstones.

## 2020-10-15 NOTE — ED Notes (Signed)
500 ml NS bolus started by EMS still infusing at this time.

## 2020-10-15 NOTE — Sepsis Progress Note (Signed)
Following per sepsis protocol   

## 2020-10-15 NOTE — ED Notes (Signed)
Lab made aware of need for blood draw 

## 2020-10-15 NOTE — ED Notes (Signed)
Lab at bedside for blood draw and blood cultures x 2

## 2020-10-15 NOTE — ED Notes (Signed)
Dr. Zackowski at bedside  

## 2020-10-16 ENCOUNTER — Encounter: Payer: Self-pay | Admitting: Family Medicine

## 2020-10-16 ENCOUNTER — Inpatient Hospital Stay (HOSPITAL_COMMUNITY): Payer: Medicare PPO

## 2020-10-16 ENCOUNTER — Telehealth: Payer: Self-pay | Admitting: Family Medicine

## 2020-10-16 DIAGNOSIS — I440 Atrioventricular block, first degree: Secondary | ICD-10-CM | POA: Diagnosis present

## 2020-10-16 DIAGNOSIS — K219 Gastro-esophageal reflux disease without esophagitis: Secondary | ICD-10-CM | POA: Diagnosis present

## 2020-10-16 DIAGNOSIS — N400 Enlarged prostate without lower urinary tract symptoms: Secondary | ICD-10-CM | POA: Diagnosis present

## 2020-10-16 DIAGNOSIS — I129 Hypertensive chronic kidney disease with stage 1 through stage 4 chronic kidney disease, or unspecified chronic kidney disease: Secondary | ICD-10-CM | POA: Diagnosis present

## 2020-10-16 DIAGNOSIS — Z6835 Body mass index (BMI) 35.0-35.9, adult: Secondary | ICD-10-CM | POA: Diagnosis not present

## 2020-10-16 DIAGNOSIS — E1165 Type 2 diabetes mellitus with hyperglycemia: Secondary | ICD-10-CM | POA: Diagnosis present

## 2020-10-16 DIAGNOSIS — I10 Essential (primary) hypertension: Secondary | ICD-10-CM | POA: Diagnosis not present

## 2020-10-16 DIAGNOSIS — G9341 Metabolic encephalopathy: Secondary | ICD-10-CM | POA: Diagnosis present

## 2020-10-16 DIAGNOSIS — J90 Pleural effusion, not elsewhere classified: Secondary | ICD-10-CM | POA: Diagnosis present

## 2020-10-16 DIAGNOSIS — K810 Acute cholecystitis: Secondary | ICD-10-CM | POA: Diagnosis present

## 2020-10-16 DIAGNOSIS — I700 Atherosclerosis of aorta: Secondary | ICD-10-CM | POA: Diagnosis present

## 2020-10-16 DIAGNOSIS — D4101 Neoplasm of uncertain behavior of right kidney: Secondary | ICD-10-CM | POA: Diagnosis present

## 2020-10-16 DIAGNOSIS — R7881 Bacteremia: Secondary | ICD-10-CM | POA: Diagnosis not present

## 2020-10-16 DIAGNOSIS — E785 Hyperlipidemia, unspecified: Secondary | ICD-10-CM

## 2020-10-16 DIAGNOSIS — N183 Chronic kidney disease, stage 3 unspecified: Secondary | ICD-10-CM

## 2020-10-16 DIAGNOSIS — C61 Malignant neoplasm of prostate: Secondary | ICD-10-CM | POA: Diagnosis present

## 2020-10-16 DIAGNOSIS — Z794 Long term (current) use of insulin: Secondary | ICD-10-CM

## 2020-10-16 DIAGNOSIS — Z20822 Contact with and (suspected) exposure to covid-19: Secondary | ICD-10-CM | POA: Diagnosis present

## 2020-10-16 DIAGNOSIS — E1122 Type 2 diabetes mellitus with diabetic chronic kidney disease: Secondary | ICD-10-CM

## 2020-10-16 DIAGNOSIS — N281 Cyst of kidney, acquired: Secondary | ICD-10-CM | POA: Diagnosis present

## 2020-10-16 DIAGNOSIS — N529 Male erectile dysfunction, unspecified: Secondary | ICD-10-CM | POA: Diagnosis present

## 2020-10-16 DIAGNOSIS — E669 Obesity, unspecified: Secondary | ICD-10-CM | POA: Diagnosis present

## 2020-10-16 DIAGNOSIS — E872 Acidosis: Secondary | ICD-10-CM | POA: Diagnosis present

## 2020-10-16 DIAGNOSIS — A419 Sepsis, unspecified organism: Secondary | ICD-10-CM | POA: Diagnosis present

## 2020-10-16 DIAGNOSIS — K750 Abscess of liver: Secondary | ICD-10-CM

## 2020-10-16 DIAGNOSIS — D631 Anemia in chronic kidney disease: Secondary | ICD-10-CM | POA: Diagnosis present

## 2020-10-16 DIAGNOSIS — R188 Other ascites: Secondary | ICD-10-CM | POA: Diagnosis present

## 2020-10-16 DIAGNOSIS — D696 Thrombocytopenia, unspecified: Secondary | ICD-10-CM | POA: Diagnosis present

## 2020-10-16 DIAGNOSIS — N1831 Chronic kidney disease, stage 3a: Secondary | ICD-10-CM | POA: Diagnosis present

## 2020-10-16 LAB — BLOOD CULTURE ID PANEL (REFLEXED) - BCID2

## 2020-10-16 LAB — COMPREHENSIVE METABOLIC PANEL
ALT: 77 U/L — ABNORMAL HIGH (ref 0–44)
AST: 67 U/L — ABNORMAL HIGH (ref 15–41)
Albumin: 2.4 g/dL — ABNORMAL LOW (ref 3.5–5.0)
Alkaline Phosphatase: 43 U/L (ref 38–126)
Anion gap: 8 (ref 5–15)
BUN: 42 mg/dL — ABNORMAL HIGH (ref 8–23)
CO2: 26 mmol/L (ref 22–32)
Calcium: 8.1 mg/dL — ABNORMAL LOW (ref 8.9–10.3)
Chloride: 100 mmol/L (ref 98–111)
Creatinine, Ser: 1.74 mg/dL — ABNORMAL HIGH (ref 0.61–1.24)
GFR, Estimated: 41 mL/min — ABNORMAL LOW (ref >60)
Glucose, Bld: 250 mg/dL — ABNORMAL HIGH (ref 70–99)
Potassium: 3.6 mmol/L (ref 3.5–5.1)
Sodium: 134 mmol/L — ABNORMAL LOW (ref 135–145)
Total Bilirubin: 1 mg/dL (ref 0.3–1.2)
Total Protein: 5.7 g/dL — ABNORMAL LOW (ref 6.5–8.1)

## 2020-10-16 LAB — URINALYSIS, ROUTINE W REFLEX MICROSCOPIC
Bilirubin Urine: NEGATIVE
Glucose, UA: 50 mg/dL — AB
Ketones, ur: 5 mg/dL — AB
Leukocytes,Ua: NEGATIVE
Nitrite: NEGATIVE
Protein, ur: 100 mg/dL — AB
Specific Gravity, Urine: 1.041 — ABNORMAL HIGH (ref 1.005–1.030)
pH: 5 (ref 5.0–8.0)

## 2020-10-16 LAB — CBC
HCT: 33.8 % — ABNORMAL LOW (ref 39.0–52.0)
Hemoglobin: 11.5 g/dL — ABNORMAL LOW (ref 13.0–17.0)
MCH: 33.4 pg (ref 26.0–34.0)
MCHC: 34 g/dL (ref 30.0–36.0)
MCV: 98.3 fL (ref 80.0–100.0)
Platelets: 112 10*3/uL — ABNORMAL LOW (ref 150–400)
RBC: 3.44 MIL/uL — ABNORMAL LOW (ref 4.22–5.81)
RDW: 13.4 % (ref 11.5–15.5)
WBC: 17.5 10*3/uL — ABNORMAL HIGH (ref 4.0–10.5)
nRBC: 0 % (ref 0.0–0.2)

## 2020-10-16 LAB — GLUCOSE, CAPILLARY
Glucose-Capillary: 171 mg/dL — ABNORMAL HIGH (ref 70–99)
Glucose-Capillary: 163 mg/dL — ABNORMAL HIGH (ref 70–99)
Glucose-Capillary: 124 mg/dL — ABNORMAL HIGH (ref 70–99)

## 2020-10-16 LAB — CBG MONITORING, ED: Glucose-Capillary: 243 mg/dL — ABNORMAL HIGH (ref 70–99)

## 2020-10-16 LAB — MAGNESIUM: Magnesium: 1.6 mg/dL — ABNORMAL LOW (ref 1.7–2.4)

## 2020-10-16 LAB — LACTIC ACID, PLASMA: Lactic Acid, Venous: 1.2 mmol/L (ref 0.5–1.9)

## 2020-10-16 MED ORDER — VANCOMYCIN HCL 1250 MG/250ML IV SOLN
1250.0000 mg | INTRAVENOUS | Status: DC
  Administered 2020-10-16: 1250 mg via INTRAVENOUS
  Filled 2020-10-16: qty 250

## 2020-10-16 MED ORDER — OXYBUTYNIN CHLORIDE 5 MG PO TABS
5.0000 mg | ORAL_TABLET | Freq: Two times a day (BID) | ORAL | Status: DC
  Administered 2020-10-16 – 2020-10-20 (×9): 5 mg via ORAL
  Filled 2020-10-16 (×9): qty 1

## 2020-10-16 MED ORDER — PANTOPRAZOLE SODIUM 40 MG PO TBEC
40.0000 mg | DELAYED_RELEASE_TABLET | Freq: Every day | ORAL | Status: DC
  Administered 2020-10-16 – 2020-10-20 (×5): 40 mg via ORAL
  Filled 2020-10-16 (×5): qty 1

## 2020-10-16 MED ORDER — VANCOMYCIN HCL 1250 MG/250ML IV SOLN: 1250.0000 mg | INTRAVENOUS | Status: DC

## 2020-10-16 MED ORDER — HEPARIN SODIUM (PORCINE) 5000 UNIT/ML IJ SOLN
5000.0000 [IU] | Freq: Three times a day (TID) | INTRAMUSCULAR | Status: DC
  Administered 2020-10-16 – 2020-10-19 (×9): 5000 [IU] via SUBCUTANEOUS
  Filled 2020-10-16 (×9): qty 1

## 2020-10-16 MED ORDER — ONDANSETRON HCL 4 MG PO TABS: 4.0000 mg | ORAL_TABLET | Freq: Four times a day (QID) | ORAL | Status: DC | PRN

## 2020-10-16 MED ORDER — OXYCODONE HCL 5 MG PO TABS
5.0000 mg | ORAL_TABLET | ORAL | Status: DC | PRN
  Administered 2020-10-16 – 2020-10-20 (×3): 5 mg via ORAL
  Filled 2020-10-16 (×3): qty 1

## 2020-10-16 MED ORDER — FAMOTIDINE 20 MG PO TABS
40.0000 mg | ORAL_TABLET | Freq: Every day | ORAL | Status: DC
  Administered 2020-10-16 – 2020-10-19 (×4): 40 mg via ORAL
  Filled 2020-10-16 (×4): qty 2

## 2020-10-16 MED ORDER — PIPERACILLIN-TAZOBACTAM 3.375 G IVPB
3.3750 g | Freq: Three times a day (TID) | INTRAVENOUS | Status: DC
  Administered 2020-10-16 – 2020-10-18 (×7): 3.375 g via INTRAVENOUS
  Filled 2020-10-16 (×8): qty 50

## 2020-10-16 MED ORDER — ACETAMINOPHEN 325 MG PO TABS: 650.0000 mg | ORAL_TABLET | Freq: Four times a day (QID) | ORAL | Status: DC | PRN

## 2020-10-16 MED ORDER — INSULIN ASPART 100 UNIT/ML IJ SOLN: 0.0000 [IU] | Freq: Every day | INTRAMUSCULAR | Status: DC

## 2020-10-16 MED ORDER — TAMSULOSIN HCL 0.4 MG PO CAPS
0.4000 mg | ORAL_CAPSULE | Freq: Two times a day (BID) | ORAL | Status: DC
  Administered 2020-10-16 – 2020-10-20 (×9): 0.4 mg via ORAL
  Filled 2020-10-16 (×9): qty 1

## 2020-10-16 MED ORDER — MORPHINE SULFATE (PF) 2 MG/ML IV SOLN
2.0000 mg | INTRAVENOUS | Status: DC | PRN
  Administered 2020-10-16 – 2020-10-17 (×4): 2 mg via INTRAVENOUS
  Filled 2020-10-16 (×4): qty 1

## 2020-10-16 MED ORDER — INSULIN ASPART 100 UNIT/ML IJ SOLN
0.0000 [IU] | Freq: Four times a day (QID) | INTRAMUSCULAR | Status: DC
  Administered 2020-10-16: 5 [IU] via SUBCUTANEOUS
  Administered 2020-10-16 (×2): 3 [IU] via SUBCUTANEOUS
  Administered 2020-10-17: 2 [IU] via SUBCUTANEOUS
  Filled 2020-10-16: qty 1

## 2020-10-16 MED ORDER — INSULIN DETEMIR 100 UNIT/ML ~~LOC~~ SOLN
10.0000 [IU] | Freq: Every day | SUBCUTANEOUS | Status: DC
  Administered 2020-10-16 – 2020-10-19 (×4): 10 [IU] via SUBCUTANEOUS
  Filled 2020-10-16 (×5): qty 0.1

## 2020-10-16 MED ORDER — LOSARTAN POTASSIUM 50 MG PO TABS
50.0000 mg | ORAL_TABLET | Freq: Every day | ORAL | Status: DC
  Administered 2020-10-16 – 2020-10-20 (×5): 50 mg via ORAL
  Filled 2020-10-16: qty 2
  Filled 2020-10-16 (×4): qty 1

## 2020-10-16 MED ORDER — SIMVASTATIN 20 MG PO TABS
20.0000 mg | ORAL_TABLET | Freq: Every day | ORAL | Status: DC
  Administered 2020-10-16 – 2020-10-19 (×4): 20 mg via ORAL
  Filled 2020-10-16 (×4): qty 1

## 2020-10-16 MED ORDER — ASPIRIN 81 MG PO CHEW
81.0000 mg | CHEWABLE_TABLET | Freq: Every day | ORAL | Status: DC
  Administered 2020-10-16 – 2020-10-20 (×5): 81 mg via ORAL
  Filled 2020-10-16 (×5): qty 1

## 2020-10-16 MED ORDER — SODIUM CHLORIDE 0.9 % IV SOLN: 2.0000 g | Freq: Two times a day (BID) | INTRAVENOUS | Status: DC

## 2020-10-16 MED ORDER — ACETAMINOPHEN 650 MG RE SUPP: 650.0000 mg | Freq: Four times a day (QID) | RECTAL | Status: DC | PRN

## 2020-10-16 MED ORDER — ONDANSETRON HCL 4 MG/2ML IJ SOLN: 4.0000 mg | Freq: Four times a day (QID) | INTRAMUSCULAR | Status: DC | PRN

## 2020-10-16 NOTE — Progress Notes (Signed)
Received a call from [facility] and they stated pt's blood culture were positive for strep species. Notified Dr. Bonner Puna.

## 2020-10-16 NOTE — Telephone Encounter (Signed)
Will do!

## 2020-10-16 NOTE — Telephone Encounter (Signed)
Wife called husband in [facility].  Blood sugar up 246.  Fluid around liver and gall bladder.  They may have to do surgery.  She wanted you to know.

## 2020-10-16 NOTE — H&P (Signed)
TRH H&P    Patient Demographics:    James Glover, is a 75 y.o. male  MRN: [id number]  DOB - 1945/10/18  Admit Date - 10/15/2020  Referring MD/NP/PA: Rogene Houston  Outpatient Primary MD for the patient is [name], MD  Patient coming from: Home  Chief complaint- Abdominal pain   HPI:    James Glover  is a 75 y.o. male, with history of CKD, hyperlipidemia, GERD, prostate cancer status posttreatment-in remission, type 2 diabetes mellitus, and more presents the ED with a chief complaint of abdominal pain.  Patient has intermittent confusion during my exam, which limits history.  At first he is only oriented to self.  After phlebotomy has a couple attempts at sticking him for blood he is more alert, but still confused.  Wife helps with history as she can.  Patient reports that he has had belly pain that been going on for some time.  I think it started 4 days ago.  At first 4 days ago wife noticed he was extra fatigue.  Then 3 days ago he felt better, they were at the beach and he helped drive home.  Then 2 days ago he felt much worse.  They went to see an outpatient physician who gave him pain pills and sent him home.  That physician set up a right upper quadrant ultrasound for this coming Friday, and a HIDA scan for a week from today.  Patient then threw up yesterday and wife reports he had a "rough night."  She describes this as he was unable to sleep, complaining of abdominal pain, and just generally not feeling well.  On the day of presentation he was somnolent all day.  He had a weak cough.  He would not take pain medications, or get up out of bed.  Wife denies had any fever.  She reports she is not concerned about his cough as he does have a chronic cough from GERD.  He has not had any diarrhea.  Patient reports no other complaints.  Patient does not smoke, drinks alcohol socially, does not use illicit drugs.  He is  vaccinated for COVID.  Patient is full code.  In the ED Temp 100.2, respiratory rate 29, systolic blood pressure in the 160s, heart rate 70-85, satting at 90-96 on room air Leukocytosis with a white blood cell count of 20,000, hemoglobin 12, platelets 126 Chemistry panel reveals an elevated BUN and creatinine at 40, 1.4-this creatinine is close to baseline Patient is hyperglycemic at 269 UA is borderline Urine culture pending CT chest and abdomen shows small bilateral pleural effusions, atherosclerosis, gallbladder distention and fat stranding in the right upper quadrant with perihepatic ascites, concerning for acute cholecystitis Negative COVID Initial lactic acid 2.0, repeat 2.2 -4 L bolus given in ED Vanco cefepime and Flagyl started Dr. Constance Haw was consulted who advises right upper quadrant ultrasound today and she will see patient    Review of systems:    Review of systems cannot be obtained secondary to patient's altered mental status    Past History  of the following :    Past Medical History:  Diagnosis Date   CKD (chronic kidney disease), stage III [facility])    nephrologist-- [place]--- per lov note 03-08-2018 in epic, stable (baseline Cr 1.5 - 2)   Dyslipidemia    Erectile dysfunction    First degree heart block    GERD (gastroesophageal reflux disease)    Hemorrhoids    internal and external   History of nuclear stress test    05-26-2009 (by dr Rollene Fare due to HTN and DM2)--- normal study w/ no ischemia,  normal LV function and wall motion , ef 70%   Hyperplasia of prostate with lower urinary tract symptoms (LUTS)    Hypertension    Hypogonadism male    Iron deficiency    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 Tresa Moore   Prostate cancer [facility]) urologist-  dr t. Tresa Moore  oncologsit-  dr Tammi Klippel   dx 05-30-2018-- Stage T1c, Gleason 4+5, High Risk   Renal cyst, left    followed by dr t. Tresa Moore--  chronic noncomplex   Type 2 diabetes mellitus  ([place])    followed by pcp   Wears partial dentures    upper      Past Surgical History:  Procedure Laterality Date   CIRCUMCISION  age 36   CYSTOSCOPY N/A 11/03/2018   Procedure: CYSTOSCOPY FLEXIBLE;  Surgeon: Alexis Frock, MD;  Location: [facility];  Service: Urology;  Laterality: N/A;  NO SEEDS FOUND IN BLADDER   KNEE ARTHROSCOPY Right 11/14/2017   dr Stann Mainland '@SCG'$    RADIOACTIVE SEED IMPLANT N/A 11/03/2018   Procedure: RADIOACTIVE SEED IMPLANT/BRACHYTHERAPY IMPLANT;  Surgeon: Alexis Frock, MD;  Location: [facility];  Service: Urology;  Laterality: N/A;       SEEDS IMPLANTED   SPACE OAR INSTILLATION N/A 11/03/2018   Procedure: SPACE OAR INSTILLATION;  Surgeon: Alexis Frock, MD;  Location: [facility];  Service: Urology;  Laterality: N/A;      Social History:      Social History   Tobacco Use   Smoking status: Never   Smokeless tobacco: Never  Substance Use Topics   Alcohol use: Yes    Alcohol/week: 3.0 standard drinks    Types: 3 Glasses of wine per week    Comment: 3 glasses of wine per week       Family History :     Family History  Problem Relation Age of Onset   Lymphoma Father    Cancer Father        lymphoma   Hypertension Father    Lymphoma Brother    Cancer Brother        lymphoma   Dementia Mother    Diabetes Mother    Diabetes Sister    Cancer Brother        skin cancer   Diabetes Brother    Lymphoma Paternal Grandfather    Cancer Paternal Grandfather        lymphoma   Diabetes Brother    Kidney disease Brother    COPD Brother    Heart disease Neg Hx       Home Medications:   Prior to Admission medications   Medication Sig Start Date End Date Taking? Authorizing Provider  aspirin 81 MG tablet Take 81 mg by mouth daily.   Yes [provider]  famotidine (PEPCID) 40 MG tablet Take 40 mg by mouth at bedtime.   Yes [provider]  fluorouracil (EFUDEX) 5 % cream  07/11/19  Yes  [provider]  hydrochlorothiazide (HYDRODIURIL) 25 MG tablet Take 1 tablet (25 mg total) by mouth daily. TAKE 1 TABLET(25 MG) BY MOUTH DAILY 08/21/20  Yes [name], MD  insulin degludec (TRESIBA FLEXTOUCH) 100 UNIT/ML FlexTouch Pen ADMINISTER 18 UNITS UNDER THE SKIN AT BEDTIME 08/21/20  Yes [name], MD  losartan (COZAAR) 50 MG tablet TAKE 1 TABLET BY MOUTH DAILY 08/21/20  Yes [name], MD  Multiple Vitamins-Minerals (MULTIVITAMIN WITH MINERALS) tablet Take 1 tablet by mouth daily.   Yes [provider]  Omega-3 Fatty Acids (FISH OIL) 1000 MG CAPS Take 1 capsule by mouth 2 (two) times daily.   Yes [provider]  omeprazole (PRILOSEC) 40 MG capsule TAKE 1 CAPSULE(40 MG) BY MOUTH IN THE MORNING AND AT BEDTIME 09/05/20  Yes [name], MD  oxybutynin (DITROPAN) 5 MG tablet Take 5 mg by mouth 2 (two) times daily.  07/30/19  Yes [provider]  pioglitazone (ACTOS) 45 MG tablet Take 1 tablet (45 mg total) by mouth daily. 08/21/20  Yes [name], MD  saxagliptin HCl (ONGLYZA) 2.5 MG TABS tablet TAKE 1 TABLET(2.5 MG) BY MOUTH DAILY 08/21/20  Yes [name], MD  simvastatin (ZOCOR) 20 MG tablet TAKE 1 TABLET(20 MG) BY MOUTH AT BEDTIME 08/21/20  Yes [name], MD  tamsulosin (FLOMAX) 0.4 MG CAPS capsule Take 1 capsule (0.4 mg total) by mouth 2 (two) times daily after a meal. For urinary urgency or weak stream. 02/08/19  Yes Bruning, Ashlyn, PA-C  valACYclovir (VALTREX) 1000 MG tablet Take 2,000 mg by mouth as needed (fever blister). Reported on 04/09/2015 10/08/14  Yes [provider]  ACCU-CHEK GUIDE test strip USE AS DIRECTED 07/17/20   [name], MD  Accu-Chek Softclix Lancets lancets 1 each by Other route 2 (two) times daily. Use as instructed 03/29/19   [name], MD  calcipotriene (DOVONOX) 0.005 % ointment  07/12/19   [provider]  clotrimazole (LOTRIMIN) 1 % cream Apply 1 application topically as needed. Patient not taking: No sig reported     [provider]  Insulin Pen Needle (BD PEN NEEDLE NANO U/F) 32G X 4 MM MISC 1 each by Does not apply route as needed. 08/27/20   [name], MD  sildenafil (REVATIO) 20 MG tablet Take 2-5 tablets once daily as needed for erectile dysfunction Patient not taking: Reported on 10/15/2020 02/02/18   [name], MD  traMADol Veatrice Bourbon) 50 MG tablet SMARTSIG:1 Tablet(s) By Mouth Every 12 Hours PRN Patient not taking: Reported on 10/15/2020 10/14/20   [provider]     Allergies:     Allergies  Allergen Reactions   Ace Inhibitors Cough     Physical Exam:   Vitals  Blood pressure (!) 155/50, pulse (!) 137, temperature 98.8 F (37.1 C), resp. rate (!) 30, height '6\' 3"'$  (1.905 m), weight 122.4 kg, SpO2 94 %.  1.  General: Patient lying supine in bed,  no acute distress   2. Psychiatric: Alert and oriented x 1, unable to provide medical history   3. Neurologic: Speech and language are normal, face is symmetric, moves all 4 extremities voluntarily, confused from his baseline which is normally alert and oriented x3 4. HEENMT:  Head is atraumatic, normocephalic, pupils reactive to light, neck is supple, trachea is midline, mucous membranes are dry   5. Respiratory : Lungs are clear to auscultation bilaterally without wheezing, rhonchi, rales, no cyanosis,  no increase in work of breathing or accessory muscle use   6. Cardiovascular : Heart rate normal, rhythm is regular, no murmurs, rubs or gallops, no peripheral edema, peripheral pulses palpated   7. Gastrointestinal:  Abdomen is soft, nondistended, diffusely tender to palpation, but concentrated in the right upper quadrant, bowel sounds active, no masses or organomegaly palpated   8. Skin:  Skin is warm, dry and intact without rashes, acute lesions, or ulcers on limited exam   9.Musculoskeletal:  No acute deformities or trauma, no asymmetry in tone, no peripheral edema, peripheral pulses palpated, no tenderness to  palpation in the extremities    Data Review:    CBC Recent Labs  Lab 10/15/20 2044  WBC 20.3*  HGB 12.0*  HCT 36.7*  PLT 126*  MCV 100.8*  MCH 33.0  MCHC 32.7  RDW 13.5  LYMPHSABS 0.3*  MONOABS 1.0  EOSABS 0.0  BASOSABS 0.1   ------------------------------------------------------------------------------------------------------------------  Results for orders placed or performed during the hospital encounter of 10/15/20 (from the past 48 hour(s))  Comprehensive metabolic panel     Status: Abnormal   Collection Time: 10/15/20  8:44 PM  Result Value Ref Range   Sodium 133 (L) 135 - 145 mmol/L   Potassium 3.7 3.5 - 5.1 mmol/L   Chloride 100 98 - 111 mmol/L   CO2 22 22 - 32 mmol/L   Glucose, Bld 269 (H) 70 - 99 mg/dL    Comment: Glucose reference range applies only to samples taken after fasting for at least 8 hours.   BUN 40 (H) 8 - 23 mg/dL   Creatinine, Ser 1.84 (H) 0.61 - 1.24 mg/dL   Calcium 8.1 (L) 8.9 - 10.3 mg/dL   Total Protein 6.3 (L) 6.5 - 8.1 g/dL   Albumin 3.0 (L) 3.5 - 5.0 g/dL   AST 67 (H) 15 - 41 U/L   ALT 66 (H) 0 - 44 U/L   Alkaline Phosphatase 41 38 - 126 U/L   Total Bilirubin 1.3 (H) 0.3 - 1.2 mg/dL   GFR, Estimated 38 (L) >60 mL/min    Comment: (NOTE) Calculated using the CKD-EPI Creatinine Equation (2021)    Anion gap 11 5 - 15    Comment: Performed at [facility], [address], [place], [place] [address]  Lactic acid, plasma     Status: Abnormal   Collection Time: 10/15/20  8:44 PM  Result Value Ref Range   Lactic Acid, Venous 2.0 (HH) 0.5 - 1.9 mmol/L    Comment: CRITICAL RESULT CALLED TO, READ BACK BY AND VERIFIED WITH: TURNER,C ON 10/15/20 AT 2125 BY LOY,C Performed at [facility], [address] [place], [place] [address]   CBC with Differential     Status: Abnormal   Collection Time: 10/15/20  8:44 PM  Result Value Ref Range   WBC 20.3 (H) 4.0 - 10.5 K/uL   RBC 3.64 (L) 4.22 - 5.81 MIL/uL   Hemoglobin 12.0 (L) 13.0 - 17.0 g/dL    HCT 36.7 (L) 39.0 - 52.0 %   MCV 100.8 (H) 80.0 - 100.0 fL   MCH 33.0 26.0 - 34.0 pg   MCHC 32.7 30.0 - 36.0 g/dL   RDW 13.5 11.5 - 15.5 %   Platelets 126 (L) 150 - 400 K/uL    Comment: Immature Platelet Fraction may be clinically indicated, consider ordering this additional test [id number]    nRBC 0.0 0.0 - 0.2 %   Neutrophils Relative % 93 %   Neutro Abs 18.6 (H)  1.7 - 7.7 K/uL   Lymphocytes Relative 1 %   Lymphs Abs 0.3 (L) 0.7 - 4.0 K/uL   Monocytes Relative 5 %   Monocytes Absolute 1.0 0.1 - 1.0 K/uL   Eosinophils Relative 0 %   Eosinophils Absolute 0.0 0.0 - 0.5 K/uL   Basophils Relative 0 %   Basophils Absolute 0.1 0.0 - 0.1 K/uL   Immature Granulocytes 1 %   Abs Immature Granulocytes 0.27 (H) 0.00 - 0.07 K/uL    Comment: Performed at [facility], [address] [place], [place] [address]  Protime-INR     Status: Abnormal   Collection Time: 10/15/20  8:44 PM  Result Value Ref Range   Prothrombin Time 16.9 (H) 11.4 - 15.2 seconds   INR 1.4 (H) 0.8 - 1.2    Comment: (NOTE) INR goal varies based on device and disease states. Performed at [facility], [address], [place], [place] [address]   Culture, blood (Routine x 2)     Status: None (Preliminary result)   Collection Time: 10/15/20  8:44 PM   Specimen: BLOOD  Result Value Ref Range   Specimen Description BLOOD BLOOD RIGHT HAND    Special Requests      BOTTLES DRAWN AEROBIC AND ANAEROBIC Blood Culture adequate volume Performed at [facility], [address] [place], [place] [address]    Culture PENDING    Report Status PENDING   Culture, blood (Routine x 2)     Status: None (Preliminary result)   Collection Time: 10/15/20  8:44 PM   Specimen: BLOOD  Result Value Ref Range   Specimen Description BLOOD BLOOD LEFT HAND    Special Requests      BOTTLES DRAWN AEROBIC AND ANAEROBIC Blood Culture adequate volume Performed at [facility], [address], [place], [place] [address]    Culture PENDING    Report  Status PENDING   APTT     Status: Abnormal   Collection Time: 10/15/20  8:44 PM  Result Value Ref Range   aPTT 38 (H) 24 - 36 seconds    Comment:        IF BASELINE aPTT IS ELEVATED, SUGGEST PATIENT RISK ASSESSMENT BE USED TO DETERMINE APPROPRIATE ANTICOAGULANT THERAPY. Performed at [facility], [address] [place], [place] [address]   Resp Panel by RT-PCR (Flu A&B, Covid) Nasopharyngeal Swab     Status: None   Collection Time: 10/15/20  8:45 PM   Specimen: Nasopharyngeal Swab; Nasopharyngeal(NP) swabs in vial transport medium  Result Value Ref Range   SARS Coronavirus 2 by RT PCR NEGATIVE NEGATIVE    Comment: (NOTE) SARS-CoV-2 target nucleic acids are NOT DETECTED.  The SARS-CoV-2 RNA is generally detectable in upper respiratory specimens during the acute phase of infection. The lowest concentration of SARS-CoV-2 viral copies this assay can detect is 138 copies/mL. A negative result does not preclude SARS-Cov-2 infection and should not be used as the sole basis for treatment or other patient management decisions. A negative result may occur with  improper specimen collection/handling, submission of specimen other than nasopharyngeal swab, presence of viral mutation(s) within the areas targeted by this assay, and inadequate number of viral copies(<138 copies/mL). A negative result must be combined with clinical observations, patient history, and epidemiological information. The expected result is Negative.  Fact Sheet for Patients:  EntrepreneurPulse.com.au  Fact Sheet for Healthcare Providers:  IncredibleEmployment.be  This test is no t yet approved or cleared by the [place] and  has been authorized  for detection and/or diagnosis of SARS-CoV-2 by FDA under an Emergency Use Authorization (EUA). This EUA will remain  in effect (meaning this test can be used) for the duration of the COVID-19 declaration under Section 564(b)(1)  of the Act, 21 U.S.C.section 360bbb-3(b)(1), unless the authorization is terminated  or revoked sooner.       Influenza A by PCR NEGATIVE NEGATIVE   Influenza B by PCR NEGATIVE NEGATIVE    Comment: (NOTE) The Xpert Xpress SARS-CoV-2/FLU/RSV plus assay is intended as an aid in the diagnosis of influenza from Nasopharyngeal swab specimens and should not be used as a sole basis for treatment. Nasal washings and aspirates are unacceptable for Xpert Xpress SARS-CoV-2/FLU/RSV testing.  Fact Sheet for Patients: EntrepreneurPulse.com.au  Fact Sheet for Healthcare Providers: IncredibleEmployment.be  This test is not yet approved or cleared by the [place] FDA and has been authorized for detection and/or diagnosis of SARS-CoV-2 by FDA under an Emergency Use Authorization (EUA). This EUA will remain in effect (meaning this test can be used) for the duration of the COVID-19 declaration under Section 564(b)(1) of the Act, 21 U.S.C. section 360bbb-3(b)(1), unless the authorization is terminated or revoked.  Performed at [facility], [address], [place], [place] [address]   Lactic acid, plasma     Status: Abnormal   Collection Time: 10/15/20 10:21 PM  Result Value Ref Range   Lactic Acid, Venous 2.2 (HH) 0.5 - 1.9 mmol/L    Comment: CRITICAL VALUE NOTED.  VALUE IS CONSISTENT WITH PREVIOUSLY REPORTED AND CALLED VALUE. Performed at [facility], [address] [place], [place] [address]   Urinalysis, Routine w reflex microscopic     Status: Abnormal   Collection Time: 10/16/20 12:36 AM  Result Value Ref Range   Color, Urine AMBER (A) YELLOW    Comment: BIOCHEMICALS MAY BE AFFECTED BY COLOR   APPearance CLOUDY (A) CLEAR   Specific Gravity, Urine 1.041 (H) 1.005 - 1.030   pH 5.0 5.0 - 8.0   Glucose, UA 50 (A) NEGATIVE mg/dL   Hgb urine dipstick SMALL (A) NEGATIVE   Bilirubin Urine NEGATIVE NEGATIVE   Ketones, ur 5 (A) NEGATIVE mg/dL    Protein, ur 100 (A) NEGATIVE mg/dL   Nitrite NEGATIVE NEGATIVE   Leukocytes,Ua NEGATIVE NEGATIVE   RBC / HPF 6-10 0 - 5 RBC/hpf   WBC, UA 0-5 0 - 5 WBC/hpf   Bacteria, UA FEW (A) NONE SEEN   Squamous Epithelial / LPF 0-5 0 - 5   Mucus PRESENT    Hyaline Casts, UA PRESENT     Comment: Performed at [facility], [address] [place], [place] [address]  Comprehensive metabolic panel     Status: Abnormal   Collection Time: 10/16/20  4:51 AM  Result Value Ref Range   Sodium 134 (L) 135 - 145 mmol/L   Potassium 3.6 3.5 - 5.1 mmol/L   Chloride 100 98 - 111 mmol/L   CO2 26 22 - 32 mmol/L   Glucose, Bld 250 (H) 70 - 99 mg/dL    Comment: Glucose reference range applies only to samples taken after fasting for at least 8 hours.   BUN 42 (H) 8 - 23 mg/dL   Creatinine, Ser 1.74 (H) 0.61 - 1.24 mg/dL   Calcium 8.1 (L) 8.9 - 10.3 mg/dL   Total Protein 5.7 (L) 6.5 - 8.1 g/dL   Albumin 2.4 (L) 3.5 - 5.0 g/dL   AST 67 (H) 15 - 41 U/L   ALT 77 (H) 0 -  44 U/L   Alkaline Phosphatase 43 38 - 126 U/L   Total Bilirubin 1.0 0.3 - 1.2 mg/dL   GFR, Estimated 41 (L) >60 mL/min    Comment: (NOTE) Calculated using the CKD-EPI Creatinine Equation (2021)    Anion gap 8 5 - 15    Comment: Performed at [facility], [address], [place], [place] [address]  Magnesium     Status: Abnormal   Collection Time: 10/16/20  4:51 AM  Result Value Ref Range   Magnesium 1.6 (L) 1.7 - 2.4 mg/dL    Comment: Performed at [facility], [address], [place], [place] [address]  CBG monitoring, ED     Status: Abnormal   Collection Time: 10/16/20  5:39 AM  Result Value Ref Range   Glucose-Capillary 243 (H) 70 - 99 mg/dL    Comment: Glucose reference range applies only to samples taken after fasting for at least 8 hours.    Chemistries  Recent Labs  Lab 10/15/20 2044 10/16/20 0451  NA 133* 134*  K 3.7 3.6  CL 100 100  CO2 22 26  GLUCOSE 269* 250*  BUN 40* 42*  CREATININE 1.84* 1.74*  CALCIUM 8.1* 8.1*  MG   --  1.6*  AST 67* 67*  ALT 66* 77*  ALKPHOS 41 43  BILITOT 1.3* 1.0   ------------------------------------------------------------------------------------------------------------------  ------------------------------------------------------------------------------------------------------------------ GFR: Estimated Creatinine Clearance: 52.5 mL/min (A) (by C-G formula based on SCr of 1.74 mg/dL (H)). Liver Function Tests: Recent Labs  Lab 10/15/20 2044 10/16/20 0451  AST 67* 67*  ALT 66* 77*  ALKPHOS 41 43  BILITOT 1.3* 1.0  PROT 6.3* 5.7*  ALBUMIN 3.0* 2.4*   No results for input(s): LIPASE, AMYLASE in the last 168 hours. No results for input(s): AMMONIA in the last 168 hours. Coagulation Profile: Recent Labs  Lab 10/15/20 2044  INR 1.4*   Cardiac Enzymes: No results for input(s): CKTOTAL, CKMB, CKMBINDEX, TROPONINI in the last 168 hours. BNP (last 3 results) No results for input(s): PROBNP in the last 8760 hours. HbA1C: No results for input(s): HGBA1C in the last 72 hours. CBG: Recent Labs  Lab 10/16/20 0539  GLUCAP 243*   Lipid Profile: No results for input(s): CHOL, HDL, LDLCALC, TRIG, CHOLHDL, LDLDIRECT in the last 72 hours. Thyroid Function Tests: No results for input(s): TSH, T4TOTAL, FREET4, T3FREE, THYROIDAB in the last 72 hours. Anemia Panel: No results for input(s): VITAMINB12, FOLATE, FERRITIN, TIBC, IRON, RETICCTPCT in the last 72 hours.  --------------------------------------------------------------------------------------------------------------- Urine analysis:    Component Value Date/Time   COLORURINE AMBER (A) 10/16/2020 0036   APPEARANCEUR CLOUDY (A) 10/16/2020 0036   LABSPEC 1.041 (H) 10/16/2020 0036   LABSPEC 1.020 12/27/2018 1315   PHURINE 5.0 10/16/2020 0036   GLUCOSEU 50 (A) 10/16/2020 0036   HGBUR SMALL (A) 10/16/2020 0036   BILIRUBINUR NEGATIVE 10/16/2020 0036   BILIRUBINUR negative 12/27/2018 1315   BILIRUBINUR neg 06/23/2016  1050   KETONESUR 5 (A) 10/16/2020 0036   PROTEINUR 100 (A) 10/16/2020 0036   UROBILINOGEN negative (A) 06/23/2016 1050   NITRITE NEGATIVE 10/16/2020 0036   LEUKOCYTESUR NEGATIVE 10/16/2020 0036      Imaging Results:    CT Chest W Contrast  Result Date: 10/15/2020 CLINICAL DATA:  Pneumonia, effusion or abscess suspected, xray done; Abdominal pain, acute, nonlocalized EXAM: CT CHEST, ABDOMEN, AND PELVIS WITH CONTRAST TECHNIQUE: Multidetector CT imaging of the chest, abdomen and pelvis was performed following the standard protocol during bolus administration of intravenous contrast. CONTRAST:  62m OMNIPAQUE IOHEXOL  300 MG/ML  SOLN COMPARISON:  Chest radiograph earlier today. Abdominopelvic CT 08/15/2020 FINDINGS: CT CHEST FINDINGS Cardiovascular: Atherosclerosis of the thoracic aorta. Upper normal heart size. There are coronary artery calcifications. No pericardial effusion. Mediastinum/Nodes: No enlarged mediastinal or hilar lymph nodes. Patulous esophagus without wall thickening. No thyroid nodule. Lungs/Pleura: Small bilateral pleural effusions. Associated bibasilar atelectasis. No evidence of pneumonia. Breathing motion artifact limits detailed assessment. The previous 3 mm right lower lobe pulmonary nodule is obscured by atelectasis on the current exam. No findings of pulmonary edema. Musculoskeletal: Small sclerotic focus in the left glenoid, nonspecific. No acute osseous abnormalities. CT ABDOMEN PELVIS FINDINGS Hepatobiliary: The gallbladder is distended. Suspect pericholecystic fat stranding, however there is motion artifact through the gallbladder. There is mild generalized fat stranding in the right upper quadrant. Abnormal appearance of the liver adjacent to the gallbladder fossa with lobulated low-density that extends up to 12 mm into the adjacent liver parenchyma for example series 4, image 62. Suspect small subcapsular collection about the inferior liver tip measuring 4 x 1.7 cm, series 2,  image 62, with additional small volume perihepatic ascites. Common bile duct is poorly defined, but not definitively distended. No definitive calcified gallstone. Pancreas: Atrophic. No definite peripancreatic fat stranding. No ductal dilatation. Spleen: Normal in size without focal abnormality. Adrenals/Urinary Tract: No adrenal nodule. There is early excretion of IV contrast in both renal collecting systems. No hydronephrosis. Limited assessment for renal calculi given contrast in the collecting systems. Cyst in the lateral left kidney, similar in appearance to prior. Hyperdense lesion in the mid right kidney is not well-defined on the current exam. Excreted IV contrast in the urinary bladder which is partially distended. Equivocal perivesicular fat stranding. Stomach/Bowel: The stomach is decompressed. There is fat stranding in the right upper quadrant adjacent to the distal stomach and proximal duodenum, detailed assessment is obscured by motion. Motion limits assessment for wall thickening in this region. There is a duodenal diverticulum. Fluid-filled nondilated distal small bowel. No obstruction. Normal appendix. Small to moderate volume of colonic stool. Left colonic diverticulosis. No diverticulitis. Vascular/Lymphatic: Aortic atherosclerosis. No aortic aneurysm. The portal vein is patent. There is no portal venous or mesenteric gas. Limited assessment for upper abdominal adenopathy due to motion. There is no retroperitoneal or pelvic adenopathy. Reproductive: Brachytherapy seeds in the prostate. Other: Fat stranding and inflammatory changes in the right upper quadrant. Small amount of perihepatic ascites. Questionable subcapsular collection is described. There is no free intra-abdominal air. No abdominal wall hernia. Musculoskeletal: Punctate bone island in the L5, unchanged. Lucent lesion within L1 vertebral body and pedicle is not significantly changed. Mixed density lesion in the right iliac bone near  the sacroiliac joint is also stable. IMPRESSION: 1. Small bilateral pleural effusions with bibasilar atelectasis. 2. Aortic atherosclerosis is well as coronary artery calcifications. 3. Gallbladder distension. There is generalized fat stranding in the right upper quadrant the may be secondary to gallbladder inflammation. Motion artifact limits detailed assessment. Abnormal appearance of the liver and a adjacent to the gallbladder fossa, not seen on prior exams. This may represent extension of inflammatory changes or invasion. Suspected small subcapsular collection at the inferior liver tip measuring 4 x 1.7 cm. Small volume perihepatic ascites. Overall findings suggest complicated cholecystitis. Suggest initial assessment with ultrasound. While MRCP may be considered to further delineate the hepatic biliary changes, given inability to hold still for CT, MRI is not recommended at this time. 4. Colonic diverticulosis without diverticulitis. Additional stable chronic findings as described Aortic Atherosclerosis (ICD10-I70.0).  Electronically Signed   By: Keith Rake M.D.   On: 10/15/2020 23:18   CT Abdomen Pelvis W Contrast  Result Date: 10/15/2020 CLINICAL DATA:  Pneumonia, effusion or abscess suspected, xray done; Abdominal pain, acute, nonlocalized EXAM: CT CHEST, ABDOMEN, AND PELVIS WITH CONTRAST TECHNIQUE: Multidetector CT imaging of the chest, abdomen and pelvis was performed following the standard protocol during bolus administration of intravenous contrast. CONTRAST:  40m OMNIPAQUE IOHEXOL 300 MG/ML  SOLN COMPARISON:  Chest radiograph earlier today. Abdominopelvic CT 08/15/2020 FINDINGS: CT CHEST FINDINGS Cardiovascular: Atherosclerosis of the thoracic aorta. Upper normal heart size. There are coronary artery calcifications. No pericardial effusion. Mediastinum/Nodes: No enlarged mediastinal or hilar lymph nodes. Patulous esophagus without wall thickening. No thyroid nodule. Lungs/Pleura: Small  bilateral pleural effusions. Associated bibasilar atelectasis. No evidence of pneumonia. Breathing motion artifact limits detailed assessment. The previous 3 mm right lower lobe pulmonary nodule is obscured by atelectasis on the current exam. No findings of pulmonary edema. Musculoskeletal: Small sclerotic focus in the left glenoid, nonspecific. No acute osseous abnormalities. CT ABDOMEN PELVIS FINDINGS Hepatobiliary: The gallbladder is distended. Suspect pericholecystic fat stranding, however there is motion artifact through the gallbladder. There is mild generalized fat stranding in the right upper quadrant. Abnormal appearance of the liver adjacent to the gallbladder fossa with lobulated low-density that extends up to 12 mm into the adjacent liver parenchyma for example series 4, image 62. Suspect small subcapsular collection about the inferior liver tip measuring 4 x 1.7 cm, series 2, image 62, with additional small volume perihepatic ascites. Common bile duct is poorly defined, but not definitively distended. No definitive calcified gallstone. Pancreas: Atrophic. No definite peripancreatic fat stranding. No ductal dilatation. Spleen: Normal in size without focal abnormality. Adrenals/Urinary Tract: No adrenal nodule. There is early excretion of IV contrast in both renal collecting systems. No hydronephrosis. Limited assessment for renal calculi given contrast in the collecting systems. Cyst in the lateral left kidney, similar in appearance to prior. Hyperdense lesion in the mid right kidney is not well-defined on the current exam. Excreted IV contrast in the urinary bladder which is partially distended. Equivocal perivesicular fat stranding. Stomach/Bowel: The stomach is decompressed. There is fat stranding in the right upper quadrant adjacent to the distal stomach and proximal duodenum, detailed assessment is obscured by motion. Motion limits assessment for wall thickening in this region. There is a duodenal  diverticulum. Fluid-filled nondilated distal small bowel. No obstruction. Normal appendix. Small to moderate volume of colonic stool. Left colonic diverticulosis. No diverticulitis. Vascular/Lymphatic: Aortic atherosclerosis. No aortic aneurysm. The portal vein is patent. There is no portal venous or mesenteric gas. Limited assessment for upper abdominal adenopathy due to motion. There is no retroperitoneal or pelvic adenopathy. Reproductive: Brachytherapy seeds in the prostate. Other: Fat stranding and inflammatory changes in the right upper quadrant. Small amount of perihepatic ascites. Questionable subcapsular collection is described. There is no free intra-abdominal air. No abdominal wall hernia. Musculoskeletal: Punctate bone island in the L5, unchanged. Lucent lesion within L1 vertebral body and pedicle is not significantly changed. Mixed density lesion in the right iliac bone near the sacroiliac joint is also stable. IMPRESSION: 1. Small bilateral pleural effusions with bibasilar atelectasis. 2. Aortic atherosclerosis is well as coronary artery calcifications. 3. Gallbladder distension. There is generalized fat stranding in the right upper quadrant the may be secondary to gallbladder inflammation. Motion artifact limits detailed assessment. Abnormal appearance of the liver and a adjacent to the gallbladder fossa, not seen on prior exams. This may represent  extension of inflammatory changes or invasion. Suspected small subcapsular collection at the inferior liver tip measuring 4 x 1.7 cm. Small volume perihepatic ascites. Overall findings suggest complicated cholecystitis. Suggest initial assessment with ultrasound. While MRCP may be considered to further delineate the hepatic biliary changes, given inability to hold still for CT, MRI is not recommended at this time. 4. Colonic diverticulosis without diverticulitis. Additional stable chronic findings as described Aortic Atherosclerosis (ICD10-I70.0).  Electronically Signed   By: Keith Rake M.D.   On: 10/15/2020 23:18   DG Chest Port 1 View  Result Date: 10/15/2020 CLINICAL DATA:  Fever and altered mental status. EXAM: PORTABLE CHEST 1 VIEW COMPARISON:  Chest radiograph dated 10/05/2018. FINDINGS: Shallow inspiration. Small left pleural effusion and left lung base atelectasis. Infiltrate is not excluded clinical correlation recommended. There is no pneumothorax. The cardiac silhouette is within normal limits. No acute osseous pathology. IMPRESSION: Small left pleural effusion and left lung base atelectasis versus infiltrate. Electronically Signed   By: Anner Crete M.D.   On: 10/15/2020 20:19       Assessment & Plan:    Active Problems:   Essential hypertension, benign   Dyslipidemia   Controlled type 2 diabetes mellitus with stage 3 chronic kidney disease, with long-term current use of insulin (HCC)   Acute cholecystitis   GERD (gastroesophageal reflux disease)   Acute cholecystitis As seen on CT scan N.p.o. Dr. Constance Haw recommend right upper quadrant ultrasound in the a.m., she will follow-up with patient today Pain control with pain scale Narrow antibiotic coverage to just Zosyn AST 67, ALT 66, T bili 1.3-continue to trend Sepsis secondary to acute cholecystitis White blood cell count 20,000, respiratory rate 29 Lactic acidosis 2.2 4 L bolus in the ED, covered with broad-spectrum antibiotics Continue Zosyn for acute cholecystitis Blood cultures pending, urine culture pending Continue to monitor Acute metabolic encephalopathy Described as somnolence Most likely secondary to sepsis Patient's wife trying to give him pain medication when he is too somnolent to take it is an indication that polypharmacy could be contributing here as well No acute deficits on neuro exam Continue treatment as above and continue to monitor Hypertension Continue losartan, hydrochlorothiazide Hyperlipidemia Continue statin GERD Continue  Pepcid and PPI   DVT Prophylaxis-   Heparin- SCDs   AM Labs Ordered, also please review Full Orders  Family Communication: Admission, patients condition and plan of care including tests being ordered have been discussed with the patient and wife who indicate understanding and agree with the plan and Code Status.  Code Status: Full code  Admission status: Inpatient :The appropriate admission status for this patient is INPATIENT. Inpatient status is judged to be reasonable and necessary in order to provide the required intensity of service to ensure the patient's safety. The patient's presenting symptoms, physical exam findings, and initial radiographic and laboratory data in the context of their chronic comorbidities is felt to place them at high risk for further clinical deterioration. Furthermore, it is not anticipated that the patient will be medically stable for discharge from the hospital within 2 midnights of admission. The following factors support the admission status of inpatient.     The patient's presenting symptoms include abdominal pain and confusion. The worrisome physical exam findings include abdominal tenderness and encephalopathic. The initial radiographic and laboratory data are worrisome because of acute cholecystitis. The chronic co-morbidities include hypertension, hyperlipidemia, GERD.       * I certify that at the point of admission it is my clinical judgment that  the patient will require inpatient hospital care spanning beyond 2 midnights from the point of admission due to high intensity of service, high risk for further deterioration and high frequency of surveillance required.*  Time spent in minutes : [place]

## 2020-10-16 NOTE — Progress Notes (Addendum)
Pharmacy Antibiotic Note  James Glover is a 75 y.o. male admitted on 10/15/2020 with sepsis.  Pharmacy has been consulted for Vancomycin dosing. WBC is elevated at 20.3. Noted renal dysfunction. Bacteremia infection as source. Gram positive cocci found in blood, both aerobic and anaerobic bottles.   Plan: Vancomycin 1250 mg IV q24h, vancomycin 2gm given last night at 2200 >>Estimated AUC: 521 Trend WBC, temp, renal function  F/U infectious work-up Drug levels as indicated   Height: '6\' 3"'$  (190.5 cm) Weight: 122.4 kg (269 lb 13.5 oz) IBW/kg (Calculated) : 84.5  Temp (24hrs), Avg:98.9 F (37.2 C), Min:97.6 F (36.4 C), Max:100.2 F (37.9 C)  Recent Labs  Lab 10/15/20 2044 10/15/20 2221 10/16/20 0451 10/16/20 0644  WBC 20.3*  --  17.5*  --   CREATININE 1.84*  --  1.74*  --   LATICACIDVEN 2.0* 2.2*  --  1.2     Estimated Creatinine Clearance: 52.5 mL/min (A) (by C-G formula based on SCr of 1.74 mg/dL (H)).    Allergies  Allergen Reactions   Ace Inhibitors Cough   Donna Christen Corey Caulfield, PharmD, MBA, BCGP Clinical Pharmacist

## 2020-10-16 NOTE — Consult Note (Signed)
Virtual consult note    Cc: bacteremia/liver abscess Attending: Bonner Puna    Abx: 8/18-c piptazo  8/17 vanc/cefepime/flagyl    A/p: Cholecystitis Liver fluid collection Bacteremia   -continue piptazo -suspect gpc could be enterococcus vs strep, less likely staph aureus. Received vanc loading dose already so should be fine awaiting bcid panel today -await surgery input to see if subliver fluid collection can be/needs to be drained -will get tte -will repeat bcx for tomorrow -anticipate 4-6 weeks antibiotics treatment; po vs IV dependent on blood cx data and management of the fluid collection           HPI: 75 y.o. male, with history of CKD, hlp/htn, gerd, prostate cancer s/p tx in remission, and dm2 admitted to [facility] n 8/17 for ams, abd pain of 4 days duration found to have bacteremia/liver abscess   Patient was evaluated by pcp and given analgesic pill. Sx progressive with plan for hida/ruq ultrasound.  Patient came to ed for evaluation due to worening sx  On presentation, fever 100.2; hds Wbc 20k, plt 126 Cr 1.4 around baseline Ct abd pelv/chest with bilateral pleural effusion, gallbladder distention and fat stranding suggestive of cholecystitis; also a fluid collection at inferior liver tip Started on bsAbx Abx narrowed to piptazo on hD#1; bcx grew gpc  Patient clinically improving  8/17 abd ct 1. Small bilateral pleural effusions with bibasilar atelectasis. 2. Aortic atherosclerosis is well as coronary artery calcifications. 3. Gallbladder distension. There is generalized fat stranding in the right upper quadrant the may be secondary to gallbladder inflammation. Motion artifact limits detailed assessment. Abnormal appearance of the liver and a adjacent to the gallbladder fossa, not seen on prior exams. This may represent extension of inflammatory changes or invasion. Suspected small subcapsular collection at the inferior liver tip measuring 4 x 1.7  cm. Small volume perihepatic ascites. Overall findings suggest complicated cholecystitis. Suggest initial assessment with ultrasound. While MRCP may be considered to further delineate the hepatic biliary changes, given inability to hold still for CT, MRI is not recommended at this time. 4. Colonic diverticulosis without diverticulitis. Additional stable chronic findings as described

## 2020-10-16 NOTE — ED Notes (Signed)
Date and time results received: 10/16/20 9:47 AM   Test: Blood culture Critical Value: Positive for gram positive cocci  Name of Provider Notified: Dr. Bonner Puna  Orders Received? Or Actions Taken? See orders.

## 2020-10-16 NOTE — TOC Initial Note (Signed)
Transition of Care [facility]) - Initial/Assessment Note    Patient Details  Name: James Glover MRN: [id number] Date of Birth: December 13, 1945  [facility] CM/SW Contact:    Iona Beard, [place] Phone Number: 10/16/2020, 1:09 PM  Clinical Narrative:                 Pt is high risk for readmission. CSW spoke with pt to complete assessment. Pt states that he lives with his wife. Pt states he is independent in completing his ADLs and he exercises daily. Pt is able to provide his own transportation. Pt has not had [facility] services. Pt does not use any DME. TOC to follow for discharge needs.   Expected Discharge Plan: Home/Self Care Barriers to Discharge: Continued Medical Work up   Patient Goals and CMS Choice Patient states their goals for this hospitalization and ongoing recovery are:: Return home CMS Medicare.gov Compare Post Acute Care list provided to:: Patient Choice offered to / list presented to : Patient  Expected Discharge Plan and Services Expected Discharge Plan: Home/Self Care In-house Referral: Clinical Social Work Discharge Planning Services: CM Consult   Living arrangements for the past 2 months: Single Family Home                                      Prior Living Arrangements/Services Living arrangements for the past 2 months: Single Family Home Lives with:: Spouse Patient language and need for interpreter reviewed:: Yes Do you feel safe going back to the place where you live?: Yes      Need for Family Participation in Patient Care: Yes (Comment) Care giver support system in place?: Yes (comment)   Criminal Activity/Legal Involvement Pertinent to Current Situation/Hospitalization: No - Comment as needed  Activities of Daily Living Home Assistive Devices/Equipment: None ADL Screening (condition at time of admission) Patient's cognitive ability adequate to safely complete daily activities?: Yes Is the patient deaf or have difficulty hearing?:  No Does the patient have difficulty seeing, even when wearing glasses/contacts?: No Does the patient have difficulty concentrating, remembering, or making decisions?: No Patient able to express need for assistance with ADLs?: Yes Does the patient have difficulty dressing or bathing?: No Independently performs ADLs?: Yes (appropriate for developmental age) Does the patient have difficulty walking or climbing stairs?: No Weakness of Legs: Both Weakness of Arms/Hands: Both  Permission Sought/Granted                  Emotional Assessment Appearance:: Appears stated age Attitude/Demeanor/Rapport: Engaged Affect (typically observed): Accepting Orientation: : Oriented to Self, Oriented to Place, Oriented to  Time, Oriented to Situation Alcohol / Substance Use: Not Applicable Psych Involvement: No (comment)  Admission diagnosis:  Acute cholecystitis [K81.0] Patient Active Problem List   Diagnosis Date Noted   Acute cholecystitis 10/16/2020   GERD (gastroesophageal reflux disease) 10/16/2020   Aortic atherosclerosis ([place]) 02/24/2020   Controlled type 2 diabetes mellitus with stage 3 chronic kidney disease, with long-term current use of insulin ([place]) 02/20/2020   Secondary hyperparathyroidism of renal origin ([place]) 02/20/2020   Anemia due to vitamin B12 deficiency 08/08/2019   Prostate cancer ([place]) 06/14/2018   CKD (chronic kidney disease) stage 3, GFR 30-59 ml/min ([place]) 02/01/2018   Type II diabetes mellitus with nephropathy ([place]) 02/15/2013   HTN (hypertension) 01/27/2013   Chronic renal insufficiency, stage II (mild) 01/27/2013   Obesity (BMI 30.0-34.9) 10/02/2012  Uncontrolled type II diabetes mellitus with nephropathy ([place]) 06/21/2012   Type II or unspecified type diabetes mellitus without mention of complication, not stated as uncontrolled 07/29/2011   Essential hypertension, benign 07/29/2011   Male hypogonadism 07/29/2011   Dyslipidemia 07/29/2011   PCP:  [name],  MD Pharmacy:   [facility] [place], [place] - [address] [place] [id number] [place] [address] Phone: [phone] Fax: [phone]     Social Determinants of Health (SDOH) Interventions    Readmission Risk Interventions Readmission Risk Prevention Plan 10/16/2020  Transportation Screening Complete  HRI or Home Care Consult Complete  Social Work Consult for [place] Planning/Counseling Complete  Palliative Care Screening Not Applicable  Medication Review Press photographer) Complete  Some recent data might be hidden

## 2020-10-16 NOTE — Progress Notes (Addendum)
Pharmacy Antibiotic Note  James Glover is a 75 y.o. male admitted on 10/15/2020 with sepsis.  Pharmacy has been consulted for Vancomycin/Cefepime dosing. WBC is elevated at 20.3. Noted renal dysfunction. ?intra-abdominal infection as source.  Plan: Vancomycin 2000 mg IV x 1, then give 1250 mg IV q24h >>Estimated AUC: 521 Cefepime 2g IV q12h Trend WBC, temp, renal function  F/U infectious work-up Drug levels as indicated   Height: '6\' 3"'$  (190.5 cm) Weight: 122.4 kg (269 lb 13.5 oz) IBW/kg (Calculated) : 84.5  Temp (24hrs), Avg:100.2 F (37.9 C), Min:100.2 F (37.9 C), Max:100.2 F (37.9 C)  Recent Labs  Lab 10/15/20 2044 10/15/20 2221  WBC 20.3*  --   CREATININE 1.84*  --   LATICACIDVEN 2.0* 2.2*    Estimated Creatinine Clearance: 49.7 mL/min (A) (by C-G formula based on SCr of 1.84 mg/dL (H)).    Allergies  Allergen Reactions   Ace Inhibitors Cough    Narda Bonds, PharmD, BCPS Clinical Pharmacist Phone: [phone]

## 2020-10-16 NOTE — Consult Note (Signed)
[facility] Consult  Reason for Consult: Acute cholecystitis  Referring Physician:  Dr. Bonner Puna  Chief Complaint   Altered Mental Status     HPI: James Glover is a 74 y.o. male with CKD, GERD, prostate cancer, DM who was in his normal state of health and doing well until he developed abdominal pain in the last 5 days or so. He says it started intermittent and has become more constant. He says that he had some nausea and vomiting and has had some diarrhea too. He has been extra fatigued and was admitted with confusion and sepsis criteria. He was found to have a distended gallbladder with collection adjacent in the liver parenchyma consistent with a liver abscess.  He is more awake and alert today and feels better per his report. His wife is also at his bedside.   Past Medical History:  Diagnosis Date   CKD (chronic kidney disease), stage III [facility])    nephrologist-- [place]--- per lov note 03-08-2018 in epic, stable (baseline Cr 1.5 - 2)   Dyslipidemia    Erectile dysfunction    First degree heart block    GERD (gastroesophageal reflux disease)    Hemorrhoids    internal and external   History of nuclear stress test    05-26-2009 (by dr Rollene Fare due to HTN and DM2)--- normal study w/ no ischemia,  normal LV function and wall motion , ef 70%   Hyperplasia of prostate with lower urinary tract symptoms (LUTS)    Hypertension    Hypogonadism male    Iron deficiency    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 Tresa Moore   Prostate cancer [facility]) urologist-  dr t. Tresa Moore  oncologsit-  dr Tammi Klippel   dx 05-30-2018-- Stage T1c, Gleason 4+5, High Risk   Renal cyst, left    followed by dr t. Tresa Moore--  chronic noncomplex   Type 2 diabetes mellitus ([place])    followed by pcp   Wears partial dentures    upper    Past Surgical History:  Procedure Laterality Date   CIRCUMCISION  age 21   CYSTOSCOPY N/A 11/03/2018   Procedure: CYSTOSCOPY FLEXIBLE;   Surgeon: Alexis Frock, MD;  Location: [facility];  Service: Urology;  Laterality: N/A;  NO SEEDS FOUND IN BLADDER   KNEE ARTHROSCOPY Right 11/14/2017   dr Stann Mainland '@SCG'$    RADIOACTIVE SEED IMPLANT N/A 11/03/2018   Procedure: RADIOACTIVE SEED IMPLANT/BRACHYTHERAPY IMPLANT;  Surgeon: Alexis Frock, MD;  Location: [facility];  Service: Urology;  Laterality: N/A;       SEEDS IMPLANTED   SPACE OAR INSTILLATION N/A 11/03/2018   Procedure: SPACE OAR INSTILLATION;  Surgeon: Alexis Frock, MD;  Location: [facility];  Service: Urology;  Laterality: N/A;    Family History  Problem Relation Age of Onset   Lymphoma Father    Cancer Father        lymphoma   Hypertension Father    Lymphoma Brother    Cancer Brother        lymphoma   Dementia Mother    Diabetes Mother    Diabetes Sister    Cancer Brother        skin cancer   Diabetes Brother    Lymphoma Paternal Grandfather    Cancer Paternal Grandfather        lymphoma   Diabetes Brother    Kidney disease Brother    COPD Brother  Heart disease Neg Hx     Social History   Tobacco Use   Smoking status: Never   Smokeless tobacco: Never  Vaping Use   Vaping Use: Never used  Substance Use Topics   Alcohol use: Yes    Alcohol/week: 3.0 standard drinks    Types: 3 Glasses of wine per week    Comment: 3 glasses of wine per week   Drug use: Never    Medications: I have reviewed the patient's current medications. Prior to Admission:  Medications Prior to Admission  Medication Sig Dispense Refill Last Dose   aspirin 81 MG tablet Take 81 mg by mouth daily.   10/14/2020   famotidine (PEPCID) 40 MG tablet Take 40 mg by mouth at bedtime.   10/14/2020   fluorouracil (EFUDEX) 5 % cream    unknown   hydrochlorothiazide (HYDRODIURIL) 25 MG tablet Take 1 tablet (25 mg total) by mouth daily. TAKE 1 TABLET(25 MG) BY MOUTH DAILY 90 tablet 1 10/14/2020   insulin degludec (TRESIBA FLEXTOUCH) 100 UNIT/ML  FlexTouch Pen ADMINISTER 18 UNITS UNDER THE SKIN AT BEDTIME 9 mL 1 10/14/2020   losartan (COZAAR) 50 MG tablet TAKE 1 TABLET BY MOUTH DAILY 90 tablet 1 10/14/2020   Multiple Vitamins-Minerals (MULTIVITAMIN WITH MINERALS) tablet Take 1 tablet by mouth daily.   10/14/2020   Omega-3 Fatty Acids (FISH OIL) 1000 MG CAPS Take 1 capsule by mouth 2 (two) times daily.   10/14/2020   omeprazole (PRILOSEC) 40 MG capsule TAKE 1 CAPSULE(40 MG) BY MOUTH IN THE MORNING AND AT BEDTIME 180 capsule 0 10/14/2020   oxybutynin (DITROPAN) 5 MG tablet Take 5 mg by mouth 2 (two) times daily.    10/14/2020   pioglitazone (ACTOS) 45 MG tablet Take 1 tablet (45 mg total) by mouth daily. 90 tablet 1 10/14/2020   saxagliptin HCl (ONGLYZA) 2.5 MG TABS tablet TAKE 1 TABLET(2.5 MG) BY MOUTH DAILY 90 tablet 1 10/14/2020   simvastatin (ZOCOR) 20 MG tablet TAKE 1 TABLET(20 MG) BY MOUTH AT BEDTIME 90 tablet 1 10/14/2020   tamsulosin (FLOMAX) 0.4 MG CAPS capsule Take 1 capsule (0.4 mg total) by mouth 2 (two) times daily after a meal. For urinary urgency or weak stream. 60 capsule 5 10/14/2020   valACYclovir (VALTREX) 1000 MG tablet Take 2,000 mg by mouth as needed (fever blister). Reported on 04/09/2015   unknown   ACCU-CHEK GUIDE test strip USE AS DIRECTED 100 strip 5    Accu-Chek Softclix Lancets lancets 1 each by Other route 2 (two) times daily. Use as instructed 100 each 5    calcipotriene (DOVONOX) 0.005 % ointment  (Patient not taking: No sig reported)      clotrimazole (LOTRIMIN) 1 % cream Apply 1 application topically as needed. (Patient not taking: No sig reported)      Insulin Pen Needle (BD PEN NEEDLE NANO U/F) 32G X 4 MM MISC 1 each by Does not apply route as needed. 100 each 3    sildenafil (REVATIO) 20 MG tablet Take 2-5 tablets once daily as needed for erectile dysfunction (Patient not taking: Reported on 10/15/2020) 100 tablet 0 Not Taking   traMADol (ULTRAM) 50 MG tablet SMARTSIG:1 Tablet(s) By Mouth Every 12 Hours PRN (Patient not  taking: Reported on 10/15/2020)   Not Taking   Scheduled:  aspirin  81 mg Oral Daily   famotidine  40 mg Oral QHS   heparin  5,000 Units Subcutaneous Q8H   insulin aspart  0-15 Units Subcutaneous Q6H   insulin  aspart  0-5 Units Subcutaneous QHS   insulin detemir  10 Units Subcutaneous QHS   losartan  50 mg Oral Daily   oxybutynin  5 mg Oral BID   pantoprazole  40 mg Oral Daily   simvastatin  20 mg Oral q1800   tamsulosin  0.4 mg Oral BID PC   Continuous:  lactated ringers Stopped (10/16/20 1353)   piperacillin-tazobactam 12.5 mL/hr at 10/16/20 1613   vancomycin     [id number] **OR** acetaminophen, morphine injection, ondansetron **OR** ondansetron (ZOFRAN) IV, oxyCODONE  Allergies  Allergen Reactions   Ace Inhibitors Cough     ROS:  A comprehensive review of systems was negative except for: Constitutional: positive for malaise and confusion Gastrointestinal: positive for abdominal pain, nausea, and vomiting  Blood pressure (!) 141/66, pulse 71, temperature 97.6 F (36.4 C), temperature source Oral, resp. rate 19, height '6\' 3"'$  (1.905 m), weight 122.4 kg, SpO2 95 %. Physical Exam Vitals reviewed.  Constitutional:      Appearance: Normal appearance.  HENT:     Head: Normocephalic.     Nose: Nose normal.  Eyes:     Pupils: Pupils are equal, round, and reactive to light.  Cardiovascular:     Rate and Rhythm: Normal rate.  Pulmonary:     Effort: Pulmonary effort is normal.  Abdominal:     General: There is distension.     Palpations: Abdomen is soft.     Tenderness: There is abdominal tenderness.  Musculoskeletal:        General: Normal range of motion.  Skin:    General: Skin is warm.  Neurological:     General: No focal deficit present.     Mental Status: He is alert and oriented to person, place, and time.  Psychiatric:        Mood and Affect: Mood normal.        Behavior: Behavior normal.        Thought Content: Thought content normal.         Judgment: Judgment normal.    Results: Results for orders placed or performed during the hospital encounter of 10/15/20 (from the past 48 hour(s))  Comprehensive metabolic panel     Status: Abnormal   Collection Time: 10/15/20  8:44 PM  Result Value Ref Range   Sodium 133 (L) 135 - 145 mmol/L   Potassium 3.7 3.5 - 5.1 mmol/L   Chloride 100 98 - 111 mmol/L   CO2 22 22 - 32 mmol/L   Glucose, Bld 269 (H) 70 - 99 mg/dL    Comment: Glucose reference range applies only to samples taken after fasting for at least 8 hours.   BUN 40 (H) 8 - 23 mg/dL   Creatinine, Ser 1.84 (H) 0.61 - 1.24 mg/dL   Calcium 8.1 (L) 8.9 - 10.3 mg/dL   Total Protein 6.3 (L) 6.5 - 8.1 g/dL   Albumin 3.0 (L) 3.5 - 5.0 g/dL   AST 67 (H) 15 - 41 U/L   ALT 66 (H) 0 - 44 U/L   Alkaline Phosphatase 41 38 - 126 U/L   Total Bilirubin 1.3 (H) 0.3 - 1.2 mg/dL   GFR, Estimated 38 (L) >60 mL/min    Comment: (NOTE) Calculated using the CKD-EPI Creatinine Equation (2021)    Anion gap 11 5 - 15    Comment: Performed at [facility], [address] [place], [place] [address]  Lactic acid, plasma     Status: Abnormal   Collection Time: 10/15/20  8:44  PM  Result Value Ref Range   Lactic Acid, Venous 2.0 (HH) 0.5 - 1.9 mmol/L    Comment: CRITICAL RESULT CALLED TO, READ BACK BY AND VERIFIED WITH: TURNER,C ON 10/15/20 AT 2125 BY LOY,C Performed at [facility], [address] [place], [place] [address]   CBC with Differential     Status: Abnormal   Collection Time: 10/15/20  8:44 PM  Result Value Ref Range   WBC 20.3 (H) 4.0 - 10.5 K/uL   RBC 3.64 (L) 4.22 - 5.81 MIL/uL   Hemoglobin 12.0 (L) 13.0 - 17.0 g/dL   HCT 36.7 (L) 39.0 - 52.0 %   MCV 100.8 (H) 80.0 - 100.0 fL   MCH 33.0 26.0 - 34.0 pg   MCHC 32.7 30.0 - 36.0 g/dL   RDW 13.5 11.5 - 15.5 %   Platelets 126 (L) 150 - 400 K/uL    Comment: Immature Platelet Fraction may be clinically indicated, consider ordering this additional test [id number]    nRBC 0.0 0.0 - 0.2  %   Neutrophils Relative % 93 %   Neutro Abs 18.6 (H) 1.7 - 7.7 K/uL   Lymphocytes Relative 1 %   Lymphs Abs 0.3 (L) 0.7 - 4.0 K/uL   Monocytes Relative 5 %   Monocytes Absolute 1.0 0.1 - 1.0 K/uL   Eosinophils Relative 0 %   Eosinophils Absolute 0.0 0.0 - 0.5 K/uL   Basophils Relative 0 %   Basophils Absolute 0.1 0.0 - 0.1 K/uL   Immature Granulocytes 1 %   Abs Immature Granulocytes 0.27 (H) 0.00 - 0.07 K/uL    Comment: Performed at [facility], [address], [place], [place] [address]  Protime-INR     Status: Abnormal   Collection Time: 10/15/20  8:44 PM  Result Value Ref Range   Prothrombin Time 16.9 (H) 11.4 - 15.2 seconds   INR 1.4 (H) 0.8 - 1.2    Comment: (NOTE) INR goal varies based on device and disease states. Performed at [facility], [address] [place], [place] [address]   Culture, blood (Routine x 2)     Status: None (Preliminary result)   Collection Time: 10/15/20  8:44 PM   Specimen: BLOOD  Result Value Ref Range   Specimen Description BLOOD BLOOD RIGHT HAND    Special Requests      BOTTLES DRAWN AEROBIC AND ANAEROBIC Blood Culture adequate volume   Culture  Setup Time      GRAM POSITIVE COCCI IN BOTH AEROBIC AND ANAEROBIC BOTTLES PREVIOUSLY CALLED [facility] Performed at [facility], [address] [place], [place] [address]    Culture PENDING    Report Status PENDING   Culture, blood (Routine x 2)     Status: None (Preliminary result)   Collection Time: 10/15/20  8:44 PM   Specimen: BLOOD  Result Value Ref Range   Specimen Description BLOOD BLOOD LEFT HAND    Special Requests      BOTTLES DRAWN AEROBIC AND ANAEROBIC Blood Culture adequate volume   Culture  Setup Time      GRAM POSITIVE COCCI IN BOTH AEROBIC AND ANAEROBIC BOTTLES Gram Stain Report Called to,Read Back By and Verified With: OAKLEY,B AT 0945 ON 8.18.22 BY RUCINSKI,B [facility] Performed at [facility], [address], [place], [place] [address]    Culture  PENDING    Report Status PENDING   APTT     Status: Abnormal   Collection Time: 10/15/20  8:44 PM  Result Value  Ref Range   aPTT 38 (H) 24 - 36 seconds    Comment:        IF BASELINE aPTT IS ELEVATED, SUGGEST PATIENT RISK ASSESSMENT BE USED TO DETERMINE APPROPRIATE ANTICOAGULANT THERAPY. Performed at [facility], [address], [place], [place] [address]   Resp Panel by RT-PCR (Flu A&B, Covid) Nasopharyngeal Swab     Status: None   Collection Time: 10/15/20  8:45 PM   Specimen: Nasopharyngeal Swab; Nasopharyngeal(NP) swabs in vial transport medium  Result Value Ref Range   SARS Coronavirus 2 by RT PCR NEGATIVE NEGATIVE    Comment: (NOTE) SARS-CoV-2 target nucleic acids are NOT DETECTED.  The SARS-CoV-2 RNA is generally detectable in upper respiratory specimens during the acute phase of infection. The lowest concentration of SARS-CoV-2 viral copies this assay can detect is 138 copies/mL. A negative result does not preclude SARS-Cov-2 infection and should not be used as the sole basis for treatment or other patient management decisions. A negative result may occur with  improper specimen collection/handling, submission of specimen other than nasopharyngeal swab, presence of viral mutation(s) within the areas targeted by this assay, and inadequate number of viral copies(<138 copies/mL). A negative result must be combined with clinical observations, patient history, and epidemiological information. The expected result is Negative.  Fact Sheet for Patients:  EntrepreneurPulse.com.au  Fact Sheet for Healthcare Providers:  IncredibleEmployment.be  This test is no t yet approved or cleared by the [place] FDA and  has been authorized for detection and/or diagnosis of SARS-CoV-2 by FDA under an Emergency Use Authorization (EUA). This EUA will remain  in effect (meaning this test can be used) for the duration of the COVID-19 declaration under  Section 564(b)(1) of the Act, 21 U.S.C.section 360bbb-3(b)(1), unless the authorization is terminated  or revoked sooner.       Influenza A by PCR NEGATIVE NEGATIVE   Influenza B by PCR NEGATIVE NEGATIVE    Comment: (NOTE) The Xpert Xpress SARS-CoV-2/FLU/RSV plus assay is intended as an aid in the diagnosis of influenza from Nasopharyngeal swab specimens and should not be used as a sole basis for treatment. Nasal washings and aspirates are unacceptable for Xpert Xpress SARS-CoV-2/FLU/RSV testing.  Fact Sheet for Patients: EntrepreneurPulse.com.au  Fact Sheet for Healthcare Providers: IncredibleEmployment.be  This test is not yet approved or cleared by the [place] FDA and has been authorized for detection and/or diagnosis of SARS-CoV-2 by FDA under an Emergency Use Authorization (EUA). This EUA will remain in effect (meaning this test can be used) for the duration of the COVID-19 declaration under Section 564(b)(1) of the Act, 21 U.S.C. section 360bbb-3(b)(1), unless the authorization is terminated or revoked.  Performed at [facility], [address], [place], [place] [address]   Lactic acid, plasma     Status: Abnormal   Collection Time: 10/15/20 10:21 PM  Result Value Ref Range   Lactic Acid, Venous 2.2 (HH) 0.5 - 1.9 mmol/L    Comment: CRITICAL VALUE NOTED.  VALUE IS CONSISTENT WITH PREVIOUSLY REPORTED AND CALLED VALUE. Performed at [facility], [address], [place], [place] [address]   Urinalysis, Routine w reflex microscopic     Status: Abnormal   Collection Time: 10/16/20 12:36 AM  Result Value Ref Range   Color, Urine AMBER (A) YELLOW    Comment: BIOCHEMICALS MAY BE AFFECTED BY COLOR   APPearance CLOUDY (A) CLEAR   Specific Gravity, Urine 1.041 (H) 1.005 - 1.030   pH 5.0 5.0 - 8.0   Glucose, UA  50 (A) NEGATIVE mg/dL   Hgb urine dipstick SMALL (A) NEGATIVE   Bilirubin Urine NEGATIVE NEGATIVE   Ketones, ur 5 (A)  NEGATIVE mg/dL   Protein, ur 100 (A) NEGATIVE mg/dL   Nitrite NEGATIVE NEGATIVE   Leukocytes,Ua NEGATIVE NEGATIVE   RBC / HPF 6-10 0 - 5 RBC/hpf   WBC, UA 0-5 0 - 5 WBC/hpf   Bacteria, UA FEW (A) NONE SEEN   Squamous Epithelial / LPF 0-5 0 - 5   Mucus PRESENT    Hyaline Casts, UA PRESENT     Comment: Performed at [facility], [address] [place], [place] [address]  CBC     Status: Abnormal   Collection Time: 10/16/20  4:51 AM  Result Value Ref Range   WBC 17.5 (H) 4.0 - 10.5 K/uL   RBC 3.44 (L) 4.22 - 5.81 MIL/uL   Hemoglobin 11.5 (L) 13.0 - 17.0 g/dL   HCT 33.8 (L) 39.0 - 52.0 %   MCV 98.3 80.0 - 100.0 fL   MCH 33.4 26.0 - 34.0 pg   MCHC 34.0 30.0 - 36.0 g/dL   RDW 13.4 11.5 - 15.5 %   Platelets 112 (L) 150 - 400 K/uL    Comment: SPECIMEN CHECKED FOR CLOTS REPEATED TO VERIFY PLATELET COUNT CONFIRMED BY SMEAR PLATELETS APPEAR ADEQUATE    nRBC 0.0 0.0 - 0.2 %    Comment: Performed at [facility], [address], [place], [place] [address]  Comprehensive metabolic panel     Status: Abnormal   Collection Time: 10/16/20  4:51 AM  Result Value Ref Range   Sodium 134 (L) 135 - 145 mmol/L   Potassium 3.6 3.5 - 5.1 mmol/L   Chloride 100 98 - 111 mmol/L   CO2 26 22 - 32 mmol/L   Glucose, Bld 250 (H) 70 - 99 mg/dL    Comment: Glucose reference range applies only to samples taken after fasting for at least 8 hours.   BUN 42 (H) 8 - 23 mg/dL   Creatinine, Ser 1.74 (H) 0.61 - 1.24 mg/dL   Calcium 8.1 (L) 8.9 - 10.3 mg/dL   Total Protein 5.7 (L) 6.5 - 8.1 g/dL   Albumin 2.4 (L) 3.5 - 5.0 g/dL   AST 67 (H) 15 - 41 U/L   ALT 77 (H) 0 - 44 U/L   Alkaline Phosphatase 43 38 - 126 U/L   Total Bilirubin 1.0 0.3 - 1.2 mg/dL   GFR, Estimated 41 (L) >60 mL/min    Comment: (NOTE) Calculated using the CKD-EPI Creatinine Equation (2021)    Anion gap 8 5 - 15    Comment: Performed at [facility], [address] [place], [place] [address]  Magnesium     Status: Abnormal   Collection  Time: 10/16/20  4:51 AM  Result Value Ref Range   Magnesium 1.6 (L) 1.7 - 2.4 mg/dL    Comment: Performed at [facility], [address] [place], [place] [address]  CBG monitoring, ED     Status: Abnormal   Collection Time: 10/16/20  5:39 AM  Result Value Ref Range   Glucose-Capillary 243 (H) 70 - 99 mg/dL    Comment: Glucose reference range applies only to samples taken after fasting for at least 8 hours.  Lactic acid, plasma     Status: None   Collection Time: 10/16/20  6:44 AM  Result Value Ref Range   Lactic Acid, Venous 1.2 0.5 - 1.9 mmol/L    Comment: Performed at [facility], [address], [place], [place] [address]  Glucose, capillary     Status: Abnormal   Collection Time: 10/16/20 12:07 PM  Result Value Ref Range   Glucose-Capillary 171 (H) 70 - 99 mg/dL    Comment: Glucose reference range applies only to samples taken after fasting for at least 8 hours.   Personally reviewed CT and [place] and reviewed with Dr. Thornton Papas- cholecystitis with no obvious stones, liver abscess adjacent to the gallbladder, no organized  CT Chest W Contrast  Result Date: 10/15/2020 CLINICAL DATA:  Pneumonia, effusion or abscess suspected, xray done; Abdominal pain, acute, nonlocalized EXAM: CT CHEST, ABDOMEN, AND PELVIS WITH CONTRAST TECHNIQUE: Multidetector CT imaging of the chest, abdomen and pelvis was performed following the standard protocol during bolus administration of intravenous contrast. CONTRAST:  16m OMNIPAQUE IOHEXOL 300 MG/ML  SOLN COMPARISON:  Chest radiograph earlier today. Abdominopelvic CT 08/15/2020 FINDINGS: CT CHEST FINDINGS Cardiovascular: Atherosclerosis of the thoracic aorta. Upper normal heart size. There are coronary artery calcifications. No pericardial effusion. Mediastinum/Nodes: No enlarged mediastinal or hilar lymph nodes. Patulous esophagus without wall thickening. No thyroid nodule. Lungs/Pleura: Small bilateral pleural effusions. Associated bibasilar atelectasis. No evidence  of pneumonia. Breathing motion artifact limits detailed assessment. The previous 3 mm right lower lobe pulmonary nodule is obscured by atelectasis on the current exam. No findings of pulmonary edema. Musculoskeletal: Small sclerotic focus in the left glenoid, nonspecific. No acute osseous abnormalities. CT ABDOMEN PELVIS FINDINGS Hepatobiliary: The gallbladder is distended. Suspect pericholecystic fat stranding, however there is motion artifact through the gallbladder. There is mild generalized fat stranding in the right upper quadrant. Abnormal appearance of the liver adjacent to the gallbladder fossa with lobulated low-density that extends up to 12 mm into the adjacent liver parenchyma for example series 4, image 62. Suspect small subcapsular collection about the inferior liver tip measuring 4 x 1.7 cm, series 2, image 62, with additional small volume perihepatic ascites. Common bile duct is poorly defined, but not definitively distended. No definitive calcified gallstone. Pancreas: Atrophic. No definite peripancreatic fat stranding. No ductal dilatation. Spleen: Normal in size without focal abnormality. Adrenals/Urinary Tract: No adrenal nodule. There is early excretion of IV contrast in both renal collecting systems. No hydronephrosis. Limited assessment for renal calculi given contrast in the collecting systems. Cyst in the lateral left kidney, similar in appearance to prior. Hyperdense lesion in the mid right kidney is not well-defined on the current exam. Excreted IV contrast in the urinary bladder which is partially distended. Equivocal perivesicular fat stranding. Stomach/Bowel: The stomach is decompressed. There is fat stranding in the right upper quadrant adjacent to the distal stomach and proximal duodenum, detailed assessment is obscured by motion. Motion limits assessment for wall thickening in this region. There is a duodenal diverticulum. Fluid-filled nondilated distal small bowel. No obstruction.  Normal appendix. Small to moderate volume of colonic stool. Left colonic diverticulosis. No diverticulitis. Vascular/Lymphatic: Aortic atherosclerosis. No aortic aneurysm. The portal vein is patent. There is no portal venous or mesenteric gas. Limited assessment for upper abdominal adenopathy due to motion. There is no retroperitoneal or pelvic adenopathy. Reproductive: Brachytherapy seeds in the prostate. Other: Fat stranding and inflammatory changes in the right upper quadrant. Small amount of perihepatic ascites. Questionable subcapsular collection is described. There is no free intra-abdominal air. No abdominal wall hernia. Musculoskeletal: Punctate bone island in the L5, unchanged. Lucent lesion within L1 vertebral body and pedicle is not significantly changed. Mixed density lesion in the right iliac bone near the sacroiliac joint is  also stable. IMPRESSION: 1. Small bilateral pleural effusions with bibasilar atelectasis. 2. Aortic atherosclerosis is well as coronary artery calcifications. 3. Gallbladder distension. There is generalized fat stranding in the right upper quadrant the may be secondary to gallbladder inflammation. Motion artifact limits detailed assessment. Abnormal appearance of the liver and a adjacent to the gallbladder fossa, not seen on prior exams. This may represent extension of inflammatory changes or invasion. Suspected small subcapsular collection at the inferior liver tip measuring 4 x 1.7 cm. Small volume perihepatic ascites. Overall findings suggest complicated cholecystitis. Suggest initial assessment with ultrasound. While MRCP may be considered to further delineate the hepatic biliary changes, given inability to hold still for CT, MRI is not recommended at this time. 4. Colonic diverticulosis without diverticulitis. Additional stable chronic findings as described Aortic Atherosclerosis (ICD10-I70.0). Electronically Signed   By: Keith Rake M.D.   On: 10/15/2020 23:18   CT  Abdomen Pelvis W Contrast  Result Date: 10/15/2020 CLINICAL DATA:  Pneumonia, effusion or abscess suspected, xray done; Abdominal pain, acute, nonlocalized EXAM: CT CHEST, ABDOMEN, AND PELVIS WITH CONTRAST TECHNIQUE: Multidetector CT imaging of the chest, abdomen and pelvis was performed following the standard protocol during bolus administration of intravenous contrast. CONTRAST:  36m OMNIPAQUE IOHEXOL 300 MG/ML  SOLN COMPARISON:  Chest radiograph earlier today. Abdominopelvic CT 08/15/2020 FINDINGS: CT CHEST FINDINGS Cardiovascular: Atherosclerosis of the thoracic aorta. Upper normal heart size. There are coronary artery calcifications. No pericardial effusion. Mediastinum/Nodes: No enlarged mediastinal or hilar lymph nodes. Patulous esophagus without wall thickening. No thyroid nodule. Lungs/Pleura: Small bilateral pleural effusions. Associated bibasilar atelectasis. No evidence of pneumonia. Breathing motion artifact limits detailed assessment. The previous 3 mm right lower lobe pulmonary nodule is obscured by atelectasis on the current exam. No findings of pulmonary edema. Musculoskeletal: Small sclerotic focus in the left glenoid, nonspecific. No acute osseous abnormalities. CT ABDOMEN PELVIS FINDINGS Hepatobiliary: The gallbladder is distended. Suspect pericholecystic fat stranding, however there is motion artifact through the gallbladder. There is mild generalized fat stranding in the right upper quadrant. Abnormal appearance of the liver adjacent to the gallbladder fossa with lobulated low-density that extends up to 12 mm into the adjacent liver parenchyma for example series 4, image 62. Suspect small subcapsular collection about the inferior liver tip measuring 4 x 1.7 cm, series 2, image 62, with additional small volume perihepatic ascites. Common bile duct is poorly defined, but not definitively distended. No definitive calcified gallstone. Pancreas: Atrophic. No definite peripancreatic fat stranding.  No ductal dilatation. Spleen: Normal in size without focal abnormality. Adrenals/Urinary Tract: No adrenal nodule. There is early excretion of IV contrast in both renal collecting systems. No hydronephrosis. Limited assessment for renal calculi given contrast in the collecting systems. Cyst in the lateral left kidney, similar in appearance to prior. Hyperdense lesion in the mid right kidney is not well-defined on the current exam. Excreted IV contrast in the urinary bladder which is partially distended. Equivocal perivesicular fat stranding. Stomach/Bowel: The stomach is decompressed. There is fat stranding in the right upper quadrant adjacent to the distal stomach and proximal duodenum, detailed assessment is obscured by motion. Motion limits assessment for wall thickening in this region. There is a duodenal diverticulum. Fluid-filled nondilated distal small bowel. No obstruction. Normal appendix. Small to moderate volume of colonic stool. Left colonic diverticulosis. No diverticulitis. Vascular/Lymphatic: Aortic atherosclerosis. No aortic aneurysm. The portal vein is patent. There is no portal venous or mesenteric gas. Limited assessment for upper abdominal adenopathy due to motion. There is no  retroperitoneal or pelvic adenopathy. Reproductive: Brachytherapy seeds in the prostate. Other: Fat stranding and inflammatory changes in the right upper quadrant. Small amount of perihepatic ascites. Questionable subcapsular collection is described. There is no free intra-abdominal air. No abdominal wall hernia. Musculoskeletal: Punctate bone island in the L5, unchanged. Lucent lesion within L1 vertebral body and pedicle is not significantly changed. Mixed density lesion in the right iliac bone near the sacroiliac joint is also stable. IMPRESSION: 1. Small bilateral pleural effusions with bibasilar atelectasis. 2. Aortic atherosclerosis is well as coronary artery calcifications. 3. Gallbladder distension. There is  generalized fat stranding in the right upper quadrant the may be secondary to gallbladder inflammation. Motion artifact limits detailed assessment. Abnormal appearance of the liver and a adjacent to the gallbladder fossa, not seen on prior exams. This may represent extension of inflammatory changes or invasion. Suspected small subcapsular collection at the inferior liver tip measuring 4 x 1.7 cm. Small volume perihepatic ascites. Overall findings suggest complicated cholecystitis. Suggest initial assessment with ultrasound. While MRCP may be considered to further delineate the hepatic biliary changes, given inability to hold still for CT, MRI is not recommended at this time. 4. Colonic diverticulosis without diverticulitis. Additional stable chronic findings as described Aortic Atherosclerosis (ICD10-I70.0). Electronically Signed   By: Keith Rake M.D.   On: 10/15/2020 23:18   DG Chest Port 1 View  Result Date: 10/15/2020 CLINICAL DATA:  Fever and altered mental status. EXAM: PORTABLE CHEST 1 VIEW COMPARISON:  Chest radiograph dated 10/05/2018. FINDINGS: Shallow inspiration. Small left pleural effusion and left lung base atelectasis. Infiltrate is not excluded clinical correlation recommended. There is no pneumothorax. The cardiac silhouette is within normal limits. No acute osseous pathology. IMPRESSION: Small left pleural effusion and left lung base atelectasis versus infiltrate. Electronically Signed   By: Anner Crete M.D.   On: 10/15/2020 20:19   US Abdomen Limited RUQ (LIVER/GB)  Result Date: 10/16/2020 CLINICAL DATA:  Acute cholecystitis. EXAM: ULTRASOUND ABDOMEN LIMITED RIGHT UPPER QUADRANT COMPARISON:  CT AP 10/15/2020 FINDINGS: Gallbladder: Gallbladder wall appears thickened. By my measurements the gallbladder measures up to 4 mm. There is mild pericholecystic fluid identified. No gallstones identified. Small volume of sludge noted within the gallbladder. Negative sonographic Murphy's sign  reported. Common bile duct: Diameter: 3.5 mm Liver: Small fluid collection adjacent to the gallbladder in the small parenchymal fluid collections within segment 4 of the liver adjacent to the gallbladder are better seen on the CT from yesterday. Portal vein is patent on color Doppler imaging with normal direction of blood flow towards the liver. Other: None. IMPRESSION: 1. Gallbladder wall thickening, sludge and pericholecystic fluid identified. Cannot exclude acalculous cholecystitis. 2. Fluid collection adjacent to the gallbladder and intraparenchymal low-density foci within segment 4 (also adjacent to the gallbladder) are better seen on the contrast enhanced CT of the abdomen from 10/15/2020. Electronically Signed   By: Kerby Moors M.D.   On: 10/16/2020 11:19     Assessment & Plan:  ARSHAWN NORRED is a 75 y.o. male with cholecystitis possibly acalculous versus he passed a stone given the elevation in the bilirubin and decrease now, liver abscess possible from a contained perforation? He is responding to antibiotics and is feeling better. Leukocytosis down and his vitals are stable. Discussed that we will try antibiotics to treat this but that he may need a cholecystostomy tube if he were to worsen or if things do not improve.   - Will need his gallbladder out at a later date  -  Continue Zosyn - Sips and chips only  - Labs in AM   All questions were answered to the satisfaction of the patient and family.  Updated RN and Dr. Bonner Puna.    Virl Cagey 10/16/2020, 1:12 PM

## 2020-10-16 NOTE — Telephone Encounter (Signed)
Noted. I received info from the hospital regarding his admission. If she calls back, you can let her know that I'm getting updates.

## 2020-10-16 NOTE — Progress Notes (Signed)
PROGRESS NOTE  Brief Narrative: James Glover  is a 75 y.o. male, with history of stage III CKD, prostate CA in remission, T2DM, HTN, HLD and GERD who presented to the ED with 4 days of abdominal pain, fatigue/somnolence leadng him to lay in bed all day, and intermittent confusion. In the ED temperature 100.3F with leukocytosis (20k) and elevated lactic acid, thrombocytopenia (126k), SCr near baseline at 1.4, hyperglycemic without anion gap. CT chest and abdomen shows small bilateral pleural effusions, atherosclerosis, gallbladder distention and fat stranding in the right upper quadrant with perihepatic ascites, concerning for acute cholecystitis. Broad spectrum antibiotics were started for sepsis, and surgery was consulted at the time of admission this morning. Blood cultures drawn at admission have grown GPC.  Subjective: More alert than on admission. Pain is controlled. No vomiting or diarrhea currently.   Objective: BP (!) 141/66 (BP Location: Right Arm)   Pulse 71   Temp 97.6 F (36.4 C) (Oral)   Resp 19   Ht '6\' 3"'$  (1.905 m)   Wt 122.4 kg   SpO2 95%   BMI 33.73 kg/m   Gen: Tired but nontoxic. No distress Pulm: Clear and nonlabored   CV: RRR, no murmur, no JVD, no edema GI: Slightly distended, tender in epigastrium predominantly with guarding without rebound. +BS  Neuro: Rousable, interactive and oriented. No focal deficits. Skin: No rashes, lesions or ulcers  Assessment & Plan: Active Problems:   Essential hypertension, benign   Dyslipidemia   Controlled type 2 diabetes mellitus with stage 3 chronic kidney disease, with long-term current use of insulin (HCC)   Acute cholecystitis   GERD (gastroesophageal reflux disease)  Sepsis with lactic acidosis due to acute acalculous cholecystitis complicated by subcapsular liver abscess (4x1.7cm):  - Continue zosyn for this. - Surgery consulted, currently hoping to avoid surgery. Size of fluid collection likely to be treatable by  antibiotics alone.  - Trend WBC and vital signs. Lactic acid cleared.  GPC bacteremia: In 4 of 4 bottles on admission.  - Will add back vancomycin for now, monitor speciation.  - Repeat blood cultures in AM - Echocardiogram.  - Discussed with ID, Dr. Gale Journey, who will evaluate.   Acute metabolic encephalopathy: Improving with treatment, suggesting sepsis as primary cause. Polypharmacy also possible.  - Minimize sedating medications.  LFT elevation: In setting of hepatic fluid collection. Stable AST/ALT, Bilirubin normalized.   T2DM: HbA1c 7% in June 2022, at goal.  - Continue basal insulin, currently on levemir in substitute for tresiba 18u qHS, SSI to continue, suspect infection-related hyperglycemia.   - Hold saxagliptin, pioglitazone.   Stage IIIa CKD: Followed by Dr. Marval Regal.   - Avoid nephrotoxins as able.   Thrombocytopenia:  - Continue monitoring.   HTN:  - Continue losartan.   HLD:  - LFTs not severely elevated, so continuing statin.   GERD:  - Continue acid suppression.   Prostate CA s/p radioactive seed implant 2020: Followed by Dr. Tresa Moore.  - Will continue home oxybutynin and flomax though these have conflicting mechanisms of action.  Patrecia Pour, MD Pager on amion 10/16/2020, 3:26 PM

## 2020-10-17 ENCOUNTER — Ambulatory Visit (HOSPITAL_COMMUNITY): Payer: Medicare PPO

## 2020-10-17 ENCOUNTER — Inpatient Hospital Stay (HOSPITAL_COMMUNITY): Payer: Medicare PPO

## 2020-10-17 ENCOUNTER — Encounter (HOSPITAL_COMMUNITY): Payer: Self-pay

## 2020-10-17 DIAGNOSIS — R7881 Bacteremia: Secondary | ICD-10-CM | POA: Diagnosis not present

## 2020-10-17 DIAGNOSIS — K750 Abscess of liver: Secondary | ICD-10-CM

## 2020-10-17 LAB — CBC
HCT: 34.1 % — ABNORMAL LOW (ref 39.0–52.0)
Hemoglobin: 11.3 g/dL — ABNORMAL LOW (ref 13.0–17.0)
MCH: 32.7 pg (ref 26.0–34.0)
MCHC: 33.1 g/dL (ref 30.0–36.0)
MCV: 98.6 fL (ref 80.0–100.0)
Platelets: 124 10*3/uL — ABNORMAL LOW (ref 150–400)
RBC: 3.46 MIL/uL — ABNORMAL LOW (ref 4.22–5.81)
RDW: 13.5 % (ref 11.5–15.5)
WBC: 15.1 10*3/uL — ABNORMAL HIGH (ref 4.0–10.5)
nRBC: 0 % (ref 0.0–0.2)

## 2020-10-17 LAB — ECHOCARDIOGRAM COMPLETE
Area-P 1/2: 3.99 cm2
Height: 75 in
S' Lateral: 2.83 cm
Weight: 4451.53 oz

## 2020-10-17 LAB — COMPREHENSIVE METABOLIC PANEL
ALT: 76 U/L — ABNORMAL HIGH (ref 0–44)
AST: 52 U/L — ABNORMAL HIGH (ref 15–41)
Albumin: 2.3 g/dL — ABNORMAL LOW (ref 3.5–5.0)
Alkaline Phosphatase: 52 U/L (ref 38–126)
Anion gap: 7 (ref 5–15)
BUN: 55 mg/dL — ABNORMAL HIGH (ref 8–23)
CO2: 25 mmol/L (ref 22–32)
Calcium: 7.8 mg/dL — ABNORMAL LOW (ref 8.9–10.3)
Chloride: 101 mmol/L (ref 98–111)
Creatinine, Ser: 1.99 mg/dL — ABNORMAL HIGH (ref 0.61–1.24)
GFR, Estimated: 35 mL/min — ABNORMAL LOW (ref >60)
Glucose, Bld: 119 mg/dL — ABNORMAL HIGH (ref 70–99)
Potassium: 3.5 mmol/L (ref 3.5–5.1)
Sodium: 133 mmol/L — ABNORMAL LOW (ref 135–145)
Total Bilirubin: 0.8 mg/dL (ref 0.3–1.2)
Total Protein: 5.8 g/dL — ABNORMAL LOW (ref 6.5–8.1)

## 2020-10-17 LAB — URINE CULTURE

## 2020-10-17 LAB — GLUCOSE, CAPILLARY
Glucose-Capillary: 122 mg/dL — ABNORMAL HIGH (ref 70–99)
Glucose-Capillary: 108 mg/dL — ABNORMAL HIGH (ref 70–99)
Glucose-Capillary: 174 mg/dL — ABNORMAL HIGH (ref 70–99)
Glucose-Capillary: 160 mg/dL — ABNORMAL HIGH (ref 70–99)

## 2020-10-17 MED ORDER — LACTATED RINGERS IV SOLN: INTRAVENOUS | Status: DC

## 2020-10-17 MED ORDER — INSULIN ASPART 100 UNIT/ML IJ SOLN
0.0000 [IU] | Freq: Three times a day (TID) | INTRAMUSCULAR | Status: DC
  Administered 2020-10-17 – 2020-10-19 (×7): 3 [IU] via SUBCUTANEOUS
  Administered 2020-10-20: 5 [IU] via SUBCUTANEOUS
  Administered 2020-10-20: 3 [IU] via SUBCUTANEOUS

## 2020-10-17 NOTE — Progress Notes (Signed)
PROGRESS NOTE  James Glover  [id number] DOB: September 23, 1945 DOA: 10/15/2020 PCP: [name], MD   Brief Narrative: James Glover  is a 75 y.o. male, with history of stage III CKD, prostate CA in remission, T2DM, HTN, HLD and GERD who presented to the ED with 4 days of abdominal pain, fatigue/somnolence leadng him to lay in bed all day, and intermittent confusion. In the ED temperature 100.74F with leukocytosis (20k) and elevated lactic acid, thrombocytopenia (126k), SCr near baseline at 1.4, hyperglycemic without anion gap. CT chest and abdomen shows small bilateral pleural effusions, atherosclerosis, gallbladder distention and fat stranding in the right upper quadrant with perihepatic ascites, concerning for acute cholecystitis. Broad spectrum antibiotics were started for sepsis. Blood cultures drawn at admission have grown Streptococcus gallolyticus for which zosyn is continued, echo is pending, repeat blood cultures have been drawn on 8/19, and protracted IV antibiotics course is planned. Surgery has been consulted, currently recommending medical therapy at this time.   Assessment & Plan: Active Problems:   Essential hypertension, benign   Dyslipidemia   Controlled type 2 diabetes mellitus with stage 3 chronic kidney disease, with long-term current use of insulin (HCC)   Acute cholecystitis   GERD (gastroesophageal reflux disease)   Liver abscess  Sepsis with lactic acidosis due to acute acalculous cholecystitis complicated by subcapsular liver abscess (4x1.7cm): Lactic acid cleared. - Continue zosyn. - Surgery consulted, currently hoping to avoid surgery. Size of fluid collection likely to be treatable by antibiotics alone.  - Trend WBC and vital signs.    S. gallolyticus bacteremia:  - Continue zosyn monotherapy pending susceptibility data per ID.  - Repeat blood cultures drawn 8/19 and NGTD - TTE pending - Appreciate ID recommendations. Will anticipate 4-6 weeks IV antibiotics.    Acute metabolic encephalopathy: Improving with treatment, suggesting sepsis as primary cause.  - Continue to minimize sedating medications - Continue delirium precautions.    LFT elevation: In setting of hepatic fluid collection. Improving mild elevation, Bilirubin normalized.    T2DM: HbA1c 7% in June 2022, at goal.  - Continue basal insulin, currently on levemir in substitute for tresiba 18u qHS, SSI to continue, suspect infection-related hyperglycemia. He is at inpatient goal currently. Will switch to AC/HS once cleared for diet. - Hold saxagliptin, pioglitazone.    Stage IIIa CKD: Followed by Dr. Marval Regal.   - Slight worsening since admission, possibly contrast-induced nephropathy. Will give some IVF today, recheck in AM.  - Hopefully will improve as we are able to discontinue vancomycin.    Thrombocytopenia: Stable. ?due to sepsis. - Continue monitoring.    HTN:  - Continue losartan.    HLD:  - LFTs not severely elevated, so continuing statin.    GERD:  - Continue acid suppression.    Prostate CA s/p radioactive seed implant 2020: Followed by Dr. Tresa Moore.  - Will continue home oxybutynin and flomax though these have conflicting mechanisms of action.  Weakness: Due to acute severe infection.  - PT, OT  Obesity: Estimated body mass index is 34.78 kg/m as calculated from the following:   Height as of this encounter: '6\' 3"'$  (1.905 m).   Weight as of this encounter: 126.2 kg.  DVT prophylaxis: Heparin Code Status: Full Family Communication: Wife at bedside Disposition Plan:  Status is: Inpatient  Remains inpatient appropriate because:Ongoing diagnostic testing needed not appropriate for outpatient work up and Inpatient level of care appropriate due to severity of illness  Dispo: The patient is from: Home  Anticipated d/c is to:  TBD              Patient currently is not medically stable to d/c.   Difficult to place patient No  Consultants:  General  surgery Infectious diseases  Procedures:  None  Antimicrobials: Vancomycin 8/17 - 8/18 Cefepime, flagyl 8/17 Zosyn 8/17 >>  Subjective: More alert, thirsty but not hungry at all. Abdominal pain is 3/10, only moderately worse when pressing on it. No fevers, chills, or nausea vomiting or diarrhea.   Objective: Vitals:   10/16/20 1635 10/16/20 2045 10/17/20 0500 10/17/20 0721  BP: (!) 154/63 (!) 150/64  135/73  Pulse: 66 68  66  Resp: '20 18  16  '$ Temp: 98.2 F (36.8 C) 98.5 F (36.9 C)  98.2 F (36.8 C)  TempSrc: Oral Oral  Oral  SpO2: 98% 96%  94%  Weight:   126.2 kg   Height:        Intake/Output Summary (Last 24 hours) at 10/17/2020 1029 Last data filed at 10/17/2020 0700 Gross per 24 hour  Intake 1767.36 ml  Output 700 ml  Net 1067.36 ml   Filed Weights   10/15/20 1943 10/17/20 0500  Weight: 122.4 kg 126.2 kg    Gen: Pleasant, elderly male in no distress Pulm: Non-labored breathing. Clear bilaterally CV: Regular rate and rhythm. Occasional PACs and PVCs on telemetry review today with compensatory pause < 2 sec. 1st deg AVB noted. No murmur, rub, or gallop. No JVD, No pitting pedal edema. GI: Abdomen soft, mildly tender in RUQ without rebound, mildly distended, with normoactive bowel sounds. No organomegaly or masses felt. Ext: Warm, no deformities Skin: No rashes, lesions or ulcers on visualized skin Neuro: Alert and oriented. No focal neurological deficits. Psych: Judgement and insight appear normal. Mood & affect appropriate.   Data Reviewed: I have personally reviewed following labs and imaging studies  CBC: Recent Labs  Lab 10/15/20 2044 10/16/20 0451 10/17/20 0708  WBC 20.3* 17.5* 15.1*  NEUTROABS 18.6*  --   --   HGB 12.0* 11.5* 11.3*  HCT 36.7* 33.8* 34.1*  MCV 100.8* 98.3 98.6  PLT 126* 112* [id number]*   Basic Metabolic Panel: Recent Labs  Lab 10/15/20 2044 10/16/20 0451 10/17/20 0708  NA 133* 134* 133*  K 3.7 3.6 3.5  CL 100 100 101  CO2 '22  26 25  '$ GLUCOSE 269* 250* 119*  BUN 40* 42* 55*  CREATININE 1.84* 1.74* 1.99*  CALCIUM 8.1* 8.1* 7.8*  MG  --  1.6*  --    GFR: Estimated Creatinine Clearance: 46.6 mL/min (A) (by C-G formula based on SCr of 1.99 mg/dL (H)). Liver Function Tests: Recent Labs  Lab 10/15/20 2044 10/16/20 0451 10/17/20 0708  AST 67* 67* 52*  ALT 66* 77* 76*  ALKPHOS 41 43 52  BILITOT 1.3* 1.0 0.8  PROT 6.3* 5.7* 5.8*  ALBUMIN 3.0* 2.4* 2.3*   No results for input(s): LIPASE, AMYLASE in the last 168 hours. No results for input(s): AMMONIA in the last 168 hours. Coagulation Profile: Recent Labs  Lab 10/15/20 2044  INR 1.4*   Cardiac Enzymes: No results for input(s): CKTOTAL, CKMB, CKMBINDEX, TROPONINI in the last 168 hours. BNP (last 3 results) No results for input(s): PROBNP in the last 8760 hours. HbA1C: No results for input(s): HGBA1C in the last 72 hours. CBG: Recent Labs  Lab 10/16/20 0539 10/16/20 1207 10/16/20 1630 10/16/20 2158 10/17/20 0634  GLUCAP 243* 171* 163* 124* 122*   Lipid Profile: No results  for input(s): CHOL, HDL, LDLCALC, TRIG, CHOLHDL, LDLDIRECT in the last 72 hours. Thyroid Function Tests: No results for input(s): TSH, T4TOTAL, FREET4, T3FREE, THYROIDAB in the last 72 hours. Anemia Panel: No results for input(s): VITAMINB12, FOLATE, FERRITIN, TIBC, IRON, RETICCTPCT in the last 72 hours. Urine analysis:    Component Value Date/Time   COLORURINE AMBER (A) 10/16/2020 0036   APPEARANCEUR CLOUDY (A) 10/16/2020 0036   LABSPEC 1.041 (H) 10/16/2020 0036   LABSPEC 1.020 12/27/2018 1315   PHURINE 5.0 10/16/2020 0036   GLUCOSEU 50 (A) 10/16/2020 0036   HGBUR SMALL (A) 10/16/2020 0036   BILIRUBINUR NEGATIVE 10/16/2020 0036   BILIRUBINUR negative 12/27/2018 1315   BILIRUBINUR neg 06/23/2016 1050   KETONESUR 5 (A) 10/16/2020 0036   PROTEINUR 100 (A) 10/16/2020 0036   UROBILINOGEN negative (A) 06/23/2016 1050   NITRITE NEGATIVE 10/16/2020 0036   LEUKOCYTESUR  NEGATIVE 10/16/2020 0036   Recent Results (from the past 240 hour(s))  Culture, blood (Routine x 2)     Status: Abnormal (Preliminary result)   Collection Time: 10/15/20  8:44 PM   Specimen: BLOOD  Result Value Ref Range Status   Specimen Description   Final    BLOOD BLOOD RIGHT HAND Performed at [facility], [address], [place], [place] [address]    Special Requests   Final    BOTTLES DRAWN AEROBIC AND ANAEROBIC Blood Culture adequate volume Performed at [facility], [address], [place], [place] [address]    Culture  Setup Time   Final    GRAM POSITIVE COCCI IN BOTH AEROBIC AND ANAEROBIC BOTTLES CRITICAL VALUE NOTED.  VALUE IS CONSISTENT WITH PREVIOUSLY REPORTED AND CALLED VALUE. Performed at [facility] TO, READ BACK BY AND VERIFIED WITHLetitia Neri RN [id number] 10/16/20 A BROWNING Performed at [facility], [place] [address] [place], [place] [address]    Culture STREPTOCOCCUS GALLOLYTICUS (A)  Final   Report Status PENDING  Incomplete  Culture, blood (Routine x 2)     Status: Abnormal (Preliminary result)   Collection Time: 10/15/20  8:44 PM   Specimen: BLOOD  Result Value Ref Range Status   Specimen Description   Final    BLOOD BLOOD LEFT HAND Performed at [facility], [address] [place], [place] [address]    Special Requests   Final    BOTTLES DRAWN AEROBIC AND ANAEROBIC Blood Culture adequate volume Performed at [facility], [address] [place], [place] [address]    Culture  Setup Time   Final    GRAM POSITIVE COCCI IN BOTH AEROBIC AND ANAEROBIC BOTTLES Gram Stain Report Called to,Read Back By and Verified With: OAKLEY,B AT 0945 ON 8.18.22 BY RUCINSKI,B Performed at [facility] ID to follow CRITICAL RESULT CALLED TO, READ BACK BY AND VERIFIED WITH: B ROBERTSON RN 1856 10/16/20 A BROWNING    Culture (A)  Final    STREPTOCOCCUS GALLOLYTICUS SUSCEPTIBILITIES TO FOLLOW Performed  at [facility], [place] [address] [place], [place] [address]    Report Status PENDING  Incomplete  Blood Culture ID Panel (Reflexed)     Status: Abnormal   Collection Time: 10/15/20  8:44 PM  Result Value Ref Range Status   Enterococcus faecalis NOT DETECTED NOT DETECTED Final   Enterococcus Faecium NOT DETECTED NOT DETECTED Final   Listeria monocytogenes NOT DETECTED NOT DETECTED Final   Staphylococcus species NOT DETECTED NOT DETECTED Final   Staphylococcus aureus (BCID) NOT DETECTED NOT  DETECTED Final   Staphylococcus epidermidis NOT DETECTED NOT DETECTED Final   Staphylococcus lugdunensis NOT DETECTED NOT DETECTED Final   Streptococcus species DETECTED (A) NOT DETECTED Final    Comment: Not Enterococcus species, Streptococcus agalactiae, Streptococcus pyogenes, or Streptococcus pneumoniae. CRITICAL RESULT CALLED TO, READ BACK BY AND VERIFIED WITH: B ROBERTSON RN 1856 10/16/20 A BROWNING    Streptococcus agalactiae NOT DETECTED NOT DETECTED Final   Streptococcus pneumoniae NOT DETECTED NOT DETECTED Final   Streptococcus pyogenes NOT DETECTED NOT DETECTED Final   A.calcoaceticus-baumannii NOT DETECTED NOT DETECTED Final   Bacteroides fragilis NOT DETECTED NOT DETECTED Final   Enterobacterales NOT DETECTED NOT DETECTED Final   Enterobacter cloacae complex NOT DETECTED NOT DETECTED Final   Escherichia coli NOT DETECTED NOT DETECTED Final   Klebsiella aerogenes NOT DETECTED NOT DETECTED Final   Klebsiella oxytoca NOT DETECTED NOT DETECTED Final   Klebsiella pneumoniae NOT DETECTED NOT DETECTED Final   Proteus species NOT DETECTED NOT DETECTED Final   Salmonella species NOT DETECTED NOT DETECTED Final   Serratia marcescens NOT DETECTED NOT DETECTED Final   Haemophilus influenzae NOT DETECTED NOT DETECTED Final   Neisseria meningitidis NOT DETECTED NOT DETECTED Final   Pseudomonas aeruginosa NOT DETECTED NOT DETECTED Final   Stenotrophomonas maltophilia NOT DETECTED NOT DETECTED  Final   Candida albicans NOT DETECTED NOT DETECTED Final   Candida auris NOT DETECTED NOT DETECTED Final   Candida glabrata NOT DETECTED NOT DETECTED Final   Candida krusei NOT DETECTED NOT DETECTED Final   Candida parapsilosis NOT DETECTED NOT DETECTED Final   Candida tropicalis NOT DETECTED NOT DETECTED Final   Cryptococcus neoformans/gattii NOT DETECTED NOT DETECTED Final    Comment: Performed at [facility], [address] [place], [place] [address]  Resp Panel by RT-PCR (Flu A&B, Covid) Nasopharyngeal Swab     Status: None   Collection Time: 10/15/20  8:45 PM   Specimen: Nasopharyngeal Swab; Nasopharyngeal(NP) swabs in vial transport medium  Result Value Ref Range Status   SARS Coronavirus 2 by RT PCR NEGATIVE NEGATIVE Final    Comment: (NOTE) SARS-CoV-2 target nucleic acids are NOT DETECTED.  The SARS-CoV-2 RNA is generally detectable in upper respiratory specimens during the acute phase of infection. The lowest concentration of SARS-CoV-2 viral copies this assay can detect is 138 copies/mL. A negative result does not preclude SARS-Cov-2 infection and should not be used as the sole basis for treatment or other patient management decisions. A negative result may occur with  improper specimen collection/handling, submission of specimen other than nasopharyngeal swab, presence of viral mutation(s) within the areas targeted by this assay, and inadequate number of viral copies(<138 copies/mL). A negative result must be combined with clinical observations, patient history, and epidemiological information. The expected result is Negative.  Fact Sheet for Patients:  EntrepreneurPulse.com.au  Fact Sheet for Healthcare Providers:  IncredibleEmployment.be  This test is no t yet approved or cleared by the [place] FDA and  has been authorized for detection and/or diagnosis of SARS-CoV-2 by FDA under an Emergency Use Authorization (EUA).  This EUA will remain  in effect (meaning this test can be used) for the duration of the COVID-19 declaration under Section 564(b)(1) of the Act, 21 U.S.C.section 360bbb-3(b)(1), unless the authorization is terminated  or revoked sooner.       Influenza A by PCR NEGATIVE NEGATIVE Final   Influenza B by PCR NEGATIVE NEGATIVE Final    Comment: (NOTE) The Xpert Xpress SARS-CoV-2/FLU/RSV plus assay is intended as  an aid in the diagnosis of influenza from Nasopharyngeal swab specimens and should not be used as a sole basis for treatment. Nasal washings and aspirates are unacceptable for Xpert Xpress SARS-CoV-2/FLU/RSV testing.  Fact Sheet for Patients: EntrepreneurPulse.com.au  Fact Sheet for Healthcare Providers: IncredibleEmployment.be  This test is not yet approved or cleared by the [place] FDA and has been authorized for detection and/or diagnosis of SARS-CoV-2 by FDA under an Emergency Use Authorization (EUA). This EUA will remain in effect (meaning this test can be used) for the duration of the COVID-19 declaration under Section 564(b)(1) of the Act, 21 U.S.C. section 360bbb-3(b)(1), unless the authorization is terminated or revoked.  Performed at [facility], [address] [place], [place] [address]   Urine Culture     Status: Abnormal   Collection Time: 10/16/20 12:36 AM   Specimen: In/Out Cath Urine  Result Value Ref Range Status   Specimen Description   Final    IN/OUT CATH URINE Performed at [facility], [address], [place], [place] [address]    Special Requests   Final    NONE Performed at [facility], [address], [place], [place] [address]    Culture MULTIPLE SPECIES PRESENT, SUGGEST RECOLLECTION (A)  Final   Report Status 10/17/2020 FINAL  Final  Culture, blood (routine x 2)     Status: None (Preliminary result)   Collection Time: 10/17/20  7:09 AM   Specimen: BLOOD  Result Value Ref Range Status   Specimen  Description BLOOD LEFT ANTECUBITAL  Final   Special Requests   Final    BOTTLES DRAWN AEROBIC AND ANAEROBIC Blood Culture adequate volume Performed at [facility], [address] [place], [place] [address]    Culture PENDING  Incomplete   Report Status PENDING  Incomplete  Culture, blood (routine x 2)     Status: None (Preliminary result)   Collection Time: 10/17/20  7:10 AM   Specimen: BLOOD  Result Value Ref Range Status   Specimen Description BLOOD RIGHT ANTECUBITAL  Final   Special Requests   Final    BOTTLES DRAWN AEROBIC AND ANAEROBIC Blood Culture adequate volume Performed at [facility], [address] [place], [place] [address]    Culture PENDING  Incomplete   Report Status PENDING  Incomplete      Radiology Studies: CT Chest W Contrast  Result Date: 10/15/2020 CLINICAL DATA:  Pneumonia, effusion or abscess suspected, xray done; Abdominal pain, acute, nonlocalized EXAM: CT CHEST, ABDOMEN, AND PELVIS WITH CONTRAST TECHNIQUE: Multidetector CT imaging of the chest, abdomen and pelvis was performed following the standard protocol during bolus administration of intravenous contrast. CONTRAST:  59m OMNIPAQUE IOHEXOL 300 MG/ML  SOLN COMPARISON:  Chest radiograph earlier today. Abdominopelvic CT 08/15/2020 FINDINGS: CT CHEST FINDINGS Cardiovascular: Atherosclerosis of the thoracic aorta. Upper normal heart size. There are coronary artery calcifications. No pericardial effusion. Mediastinum/Nodes: No enlarged mediastinal or hilar lymph nodes. Patulous esophagus without wall thickening. No thyroid nodule. Lungs/Pleura: Small bilateral pleural effusions. Associated bibasilar atelectasis. No evidence of pneumonia. Breathing motion artifact limits detailed assessment. The previous 3 mm right lower lobe pulmonary nodule is obscured by atelectasis on the current exam. No findings of pulmonary edema. Musculoskeletal: Small sclerotic focus in the left glenoid, nonspecific. No acute osseous  abnormalities. CT ABDOMEN PELVIS FINDINGS Hepatobiliary: The gallbladder is distended. Suspect pericholecystic fat stranding, however there is motion artifact through the gallbladder. There is mild generalized fat stranding in the right upper quadrant. Abnormal appearance of the liver adjacent  to the gallbladder fossa with lobulated low-density that extends up to 12 mm into the adjacent liver parenchyma for example series 4, image 62. Suspect small subcapsular collection about the inferior liver tip measuring 4 x 1.7 cm, series 2, image 62, with additional small volume perihepatic ascites. Common bile duct is poorly defined, but not definitively distended. No definitive calcified gallstone. Pancreas: Atrophic. No definite peripancreatic fat stranding. No ductal dilatation. Spleen: Normal in size without focal abnormality. Adrenals/Urinary Tract: No adrenal nodule. There is early excretion of IV contrast in both renal collecting systems. No hydronephrosis. Limited assessment for renal calculi given contrast in the collecting systems. Cyst in the lateral left kidney, similar in appearance to prior. Hyperdense lesion in the mid right kidney is not well-defined on the current exam. Excreted IV contrast in the urinary bladder which is partially distended. Equivocal perivesicular fat stranding. Stomach/Bowel: The stomach is decompressed. There is fat stranding in the right upper quadrant adjacent to the distal stomach and proximal duodenum, detailed assessment is obscured by motion. Motion limits assessment for wall thickening in this region. There is a duodenal diverticulum. Fluid-filled nondilated distal small bowel. No obstruction. Normal appendix. Small to moderate volume of colonic stool. Left colonic diverticulosis. No diverticulitis. Vascular/Lymphatic: Aortic atherosclerosis. No aortic aneurysm. The portal vein is patent. There is no portal venous or mesenteric gas. Limited assessment for upper abdominal  adenopathy due to motion. There is no retroperitoneal or pelvic adenopathy. Reproductive: Brachytherapy seeds in the prostate. Other: Fat stranding and inflammatory changes in the right upper quadrant. Small amount of perihepatic ascites. Questionable subcapsular collection is described. There is no free intra-abdominal air. No abdominal wall hernia. Musculoskeletal: Punctate bone island in the L5, unchanged. Lucent lesion within L1 vertebral body and pedicle is not significantly changed. Mixed density lesion in the right iliac bone near the sacroiliac joint is also stable. IMPRESSION: 1. Small bilateral pleural effusions with bibasilar atelectasis. 2. Aortic atherosclerosis is well as coronary artery calcifications. 3. Gallbladder distension. There is generalized fat stranding in the right upper quadrant the may be secondary to gallbladder inflammation. Motion artifact limits detailed assessment. Abnormal appearance of the liver and a adjacent to the gallbladder fossa, not seen on prior exams. This may represent extension of inflammatory changes or invasion. Suspected small subcapsular collection at the inferior liver tip measuring 4 x 1.7 cm. Small volume perihepatic ascites. Overall findings suggest complicated cholecystitis. Suggest initial assessment with ultrasound. While MRCP may be considered to further delineate the hepatic biliary changes, given inability to hold still for CT, MRI is not recommended at this time. 4. Colonic diverticulosis without diverticulitis. Additional stable chronic findings as described Aortic Atherosclerosis (ICD10-I70.0). Electronically Signed   By: Keith Rake M.D.   On: 10/15/2020 23:18   CT Abdomen Pelvis W Contrast  Result Date: 10/15/2020 CLINICAL DATA:  Pneumonia, effusion or abscess suspected, xray done; Abdominal pain, acute, nonlocalized EXAM: CT CHEST, ABDOMEN, AND PELVIS WITH CONTRAST TECHNIQUE: Multidetector CT imaging of the chest, abdomen and pelvis was  performed following the standard protocol during bolus administration of intravenous contrast. CONTRAST:  6m OMNIPAQUE IOHEXOL 300 MG/ML  SOLN COMPARISON:  Chest radiograph earlier today. Abdominopelvic CT 08/15/2020 FINDINGS: CT CHEST FINDINGS Cardiovascular: Atherosclerosis of the thoracic aorta. Upper normal heart size. There are coronary artery calcifications. No pericardial effusion. Mediastinum/Nodes: No enlarged mediastinal or hilar lymph nodes. Patulous esophagus without wall thickening. No thyroid nodule. Lungs/Pleura: Small bilateral pleural effusions. Associated bibasilar atelectasis. No evidence of pneumonia. Breathing motion artifact limits detailed  assessment. The previous 3 mm right lower lobe pulmonary nodule is obscured by atelectasis on the current exam. No findings of pulmonary edema. Musculoskeletal: Small sclerotic focus in the left glenoid, nonspecific. No acute osseous abnormalities. CT ABDOMEN PELVIS FINDINGS Hepatobiliary: The gallbladder is distended. Suspect pericholecystic fat stranding, however there is motion artifact through the gallbladder. There is mild generalized fat stranding in the right upper quadrant. Abnormal appearance of the liver adjacent to the gallbladder fossa with lobulated low-density that extends up to 12 mm into the adjacent liver parenchyma for example series 4, image 62. Suspect small subcapsular collection about the inferior liver tip measuring 4 x 1.7 cm, series 2, image 62, with additional small volume perihepatic ascites. Common bile duct is poorly defined, but not definitively distended. No definitive calcified gallstone. Pancreas: Atrophic. No definite peripancreatic fat stranding. No ductal dilatation. Spleen: Normal in size without focal abnormality. Adrenals/Urinary Tract: No adrenal nodule. There is early excretion of IV contrast in both renal collecting systems. No hydronephrosis. Limited assessment for renal calculi given contrast in the collecting  systems. Cyst in the lateral left kidney, similar in appearance to prior. Hyperdense lesion in the mid right kidney is not well-defined on the current exam. Excreted IV contrast in the urinary bladder which is partially distended. Equivocal perivesicular fat stranding. Stomach/Bowel: The stomach is decompressed. There is fat stranding in the right upper quadrant adjacent to the distal stomach and proximal duodenum, detailed assessment is obscured by motion. Motion limits assessment for wall thickening in this region. There is a duodenal diverticulum. Fluid-filled nondilated distal small bowel. No obstruction. Normal appendix. Small to moderate volume of colonic stool. Left colonic diverticulosis. No diverticulitis. Vascular/Lymphatic: Aortic atherosclerosis. No aortic aneurysm. The portal vein is patent. There is no portal venous or mesenteric gas. Limited assessment for upper abdominal adenopathy due to motion. There is no retroperitoneal or pelvic adenopathy. Reproductive: Brachytherapy seeds in the prostate. Other: Fat stranding and inflammatory changes in the right upper quadrant. Small amount of perihepatic ascites. Questionable subcapsular collection is described. There is no free intra-abdominal air. No abdominal wall hernia. Musculoskeletal: Punctate bone island in the L5, unchanged. Lucent lesion within L1 vertebral body and pedicle is not significantly changed. Mixed density lesion in the right iliac bone near the sacroiliac joint is also stable. IMPRESSION: 1. Small bilateral pleural effusions with bibasilar atelectasis. 2. Aortic atherosclerosis is well as coronary artery calcifications. 3. Gallbladder distension. There is generalized fat stranding in the right upper quadrant the may be secondary to gallbladder inflammation. Motion artifact limits detailed assessment. Abnormal appearance of the liver and a adjacent to the gallbladder fossa, not seen on prior exams. This may represent extension of  inflammatory changes or invasion. Suspected small subcapsular collection at the inferior liver tip measuring 4 x 1.7 cm. Small volume perihepatic ascites. Overall findings suggest complicated cholecystitis. Suggest initial assessment with ultrasound. While MRCP may be considered to further delineate the hepatic biliary changes, given inability to hold still for CT, MRI is not recommended at this time. 4. Colonic diverticulosis without diverticulitis. Additional stable chronic findings as described Aortic Atherosclerosis (ICD10-I70.0). Electronically Signed   By: Keith Rake M.D.   On: 10/15/2020 23:18   DG Chest Port 1 View  Result Date: 10/15/2020 CLINICAL DATA:  Fever and altered mental status. EXAM: PORTABLE CHEST 1 VIEW COMPARISON:  Chest radiograph dated 10/05/2018. FINDINGS: Shallow inspiration. Small left pleural effusion and left lung base atelectasis. Infiltrate is not excluded clinical correlation recommended. There is no pneumothorax. The  cardiac silhouette is within normal limits. No acute osseous pathology. IMPRESSION: Small left pleural effusion and left lung base atelectasis versus infiltrate. Electronically Signed   By: Anner Crete M.D.   On: 10/15/2020 20:19   US Abdomen Limited RUQ (LIVER/GB)  Result Date: 10/16/2020 CLINICAL DATA:  Acute cholecystitis. EXAM: ULTRASOUND ABDOMEN LIMITED RIGHT UPPER QUADRANT COMPARISON:  CT AP 10/15/2020 FINDINGS: Gallbladder: Gallbladder wall appears thickened. By my measurements the gallbladder measures up to 4 mm. There is mild pericholecystic fluid identified. No gallstones identified. Small volume of sludge noted within the gallbladder. Negative sonographic Murphy's sign reported. Common bile duct: Diameter: 3.5 mm Liver: Small fluid collection adjacent to the gallbladder in the small parenchymal fluid collections within segment 4 of the liver adjacent to the gallbladder are better seen on the CT from yesterday. Portal vein is patent on color  Doppler imaging with normal direction of blood flow towards the liver. Other: None. IMPRESSION: 1. Gallbladder wall thickening, sludge and pericholecystic fluid identified. Cannot exclude acalculous cholecystitis. 2. Fluid collection adjacent to the gallbladder and intraparenchymal low-density foci within segment 4 (also adjacent to the gallbladder) are better seen on the contrast enhanced CT of the abdomen from 10/15/2020. Electronically Signed   By: Kerby Moors M.D.   On: 10/16/2020 11:19    Scheduled Meds:  aspirin  81 mg Oral Daily   famotidine  40 mg Oral QHS   heparin  5,000 Units Subcutaneous Q8H   insulin aspart  0-15 Units Subcutaneous Q6H   insulin aspart  0-5 Units Subcutaneous QHS   insulin detemir  10 Units Subcutaneous QHS   losartan  50 mg Oral Daily   oxybutynin  5 mg Oral BID   pantoprazole  40 mg Oral Daily   simvastatin  20 mg Oral q1800   tamsulosin  0.4 mg Oral BID PC   Continuous Infusions:  piperacillin-tazobactam 3.375 g (10/17/20 0626)     LOS: 1 day   Time spent: 35 minutes.  Patrecia Pour, MD [facility] [email or web address] 10/17/2020, 10:29 AM

## 2020-10-17 NOTE — NC FL2 (Signed)
[place] LEVEL OF CARE SCREENING TOOL     IDENTIFICATION  Patient Name: James Glover Birthdate: [birth year] Sex: male Admission Date (Current Location): 10/15/2020  [facility] and [place] Number:  [employer] and Address:  [place] [address] [place]      Provider Number: [id number]  Attending Physician Name and Address:  Patrecia Pour, MD  Relative Name and Phone Number:       Current Level of Care: Hospital Recommended Level of Care: [place] Prior Approval Number:    Date Approved/Denied:   PASRR Number: [id number]  Discharge Plan: SNF    Current Diagnoses: Patient Active Problem List   Diagnosis Date Noted   Acute cholecystitis 10/16/2020   GERD (gastroesophageal reflux disease) 10/16/2020   Liver abscess    Aortic atherosclerosis ([place]) 02/24/2020   Controlled type 2 diabetes mellitus with stage 3 chronic kidney disease, with long-term current use of insulin ([place]) 02/20/2020   Secondary hyperparathyroidism of renal origin ([place]) 02/20/2020   Anemia due to vitamin B12 deficiency 08/08/2019   Prostate cancer ([place]) 06/14/2018   CKD (chronic kidney disease) stage 3, GFR 30-59 ml/min ([place]) 02/01/2018   Type II diabetes mellitus with nephropathy ([place]) 02/15/2013   HTN (hypertension) 01/27/2013   Chronic renal insufficiency, stage II (mild) 01/27/2013   Obesity (BMI 30.0-34.9) 10/02/2012   Uncontrolled type II diabetes mellitus with nephropathy ([place]) 06/21/2012   Type II or unspecified type diabetes mellitus without mention of complication, not stated as uncontrolled 07/29/2011   Essential hypertension, benign 07/29/2011   Male hypogonadism 07/29/2011   Dyslipidemia 07/29/2011    Orientation RESPIRATION BLADDER Height & Weight     Self, Time, Situation, Place  O2 (See dc summary) Continent Weight: 278 lb 3.5 oz (126.2 kg) Height:  '6\' 3"'$  (190.5 cm)  BEHAVIORAL SYMPTOMS/MOOD NEUROLOGICAL  BOWEL NUTRITION STATUS      Continent Diet (see dc summary)  AMBULATORY STATUS COMMUNICATION OF NEEDS Skin   Extensive Assist Verbally Surgical wounds                       Personal Care Assistance Level of Assistance  Bathing, Feeding, Dressing Bathing Assistance: Limited assistance Feeding assistance: Independent Dressing Assistance: Limited assistance     Functional Limitations Info  Sight, Hearing, Speech Sight Info: Adequate Hearing Info: Adequate Speech Info: Adequate    SPECIAL CARE FACTORS FREQUENCY  PT (By licensed PT), OT (By licensed OT)     PT Frequency: 5x week OT Frequency: 3x week            Contractures Contractures Info: Not present    Additional Factors Info  Code Status, Allergies Code Status Info: Full Allergies Info: Ace Inhibitors           Current Medications (10/17/2020):  This is the current hospital active medication list Current Facility-Administered Medications  Medication Dose Route Frequency Provider Last Rate Last Admin   acetaminophen (TYLENOL) tablet 650 mg  650 mg Oral Q6H PRN Zierle-Ghosh, Asia B, DO       Or   acetaminophen (TYLENOL) suppository 650 mg  650 mg Rectal Q6H PRN Zierle-Ghosh, Asia B, DO       aspirin chewable tablet 81 mg  81 mg Oral Daily Zierle-Ghosh, Asia B, DO   81 mg at 10/17/20 0809   famotidine (PEPCID) tablet 40 mg  40 mg Oral QHS Zierle-Ghosh, Asia B, DO   40 mg at 10/16/20 2137  heparin injection 5,000 Units  5,000 Units Subcutaneous Q8H Zierle-Ghosh, Asia B, DO   5,000 Units at 10/17/20 1204   insulin aspart (novoLOG) injection 0-15 Units  0-15 Units Subcutaneous TID WC Patrecia Pour, MD       insulin aspart (novoLOG) injection 0-5 Units  0-5 Units Subcutaneous QHS Zierle-Ghosh, Asia B, DO       insulin detemir (LEVEMIR) injection 10 Units  10 Units Subcutaneous QHS Zierle-Ghosh, Asia B, DO   10 Units at 10/16/20 2246   lactated ringers infusion   Intravenous Continuous Patrecia Pour, MD        losartan (COZAAR) tablet 50 mg  50 mg Oral Daily Zierle-Ghosh, Asia B, DO   50 mg at 10/17/20 0809   morphine 2 MG/ML injection 2 mg  2 mg Intravenous Q2H PRN Zierle-Ghosh, Asia B, DO   2 mg at 10/17/20 0809   ondansetron (ZOFRAN) tablet 4 mg  4 mg Oral Q6H PRN Zierle-Ghosh, Asia B, DO       Or   ondansetron (ZOFRAN) injection 4 mg  4 mg Intravenous Q6H PRN Zierle-Ghosh, Asia B, DO       oxybutynin (DITROPAN) tablet 5 mg  5 mg Oral BID Zierle-Ghosh, Asia B, DO   5 mg at 10/17/20 0809   oxyCODONE (Oxy IR/ROXICODONE) immediate release tablet 5 mg  5 mg Oral Q4H PRN Zierle-Ghosh, Asia B, DO   5 mg at 10/16/20 1712   pantoprazole (PROTONIX) EC tablet 40 mg  40 mg Oral Daily Zierle-Ghosh, Asia B, DO   40 mg at 10/17/20 0809   piperacillin-tazobactam (ZOSYN) IVPB 3.375 g  3.375 g Intravenous Q8H Zierle-Ghosh, Asia B, DO 12.5 mL/hr at 10/17/20 0626 3.375 g at 10/17/20 0626   simvastatin (ZOCOR) tablet 20 mg  20 mg Oral q1800 Zierle-Ghosh, Asia B, DO   20 mg at 10/16/20 1712   tamsulosin (FLOMAX) capsule 0.4 mg  0.4 mg Oral BID PC Zierle-Ghosh, Asia B, DO   0.4 mg at 10/17/20 0809     Discharge Medications: Please see discharge summary for a list of discharge medications.  Relevant Imaging Results:  Relevant Lab Results:   Additional Information SSN: [id number] LCSW

## 2020-10-17 NOTE — Care Management Important Message (Signed)
Important Message  Patient Details  Name: James Glover MRN: [id number] Date of Birth: 1945/07/02   Medicare Important Message Given:  Yes     Tommy Medal 10/17/2020, 12:24 PM

## 2020-10-17 NOTE — Progress Notes (Signed)
[facility] Progress Note     Subjective: Feeling better and no nausea. Wants to eat. Has bacteremia in addition to the abscess and cholecystitis.   Objective: Vital signs in last 24 hours: Temp:  [97.6 F (36.4 C)-98.5 F (36.9 C)] 98.2 F (36.8 C) (08/19 0721) Pulse Rate:  [66-71] 66 (08/19 0721) Resp:  [16-20] 16 (08/19 0721) BP: (135-154)/(63-73) 135/73 (08/19 0721) SpO2:  [94 %-98 %] 94 % (08/19 0721) Weight:  [126.2 kg] 126.2 kg (08/19 0500) Last BM Date: 10/15/20  Intake/Output from previous day: 08/18 0701 - 08/19 0700 In: 1817.4 [I.V.:1720.8; IV Piggyback:96.5] Out: 700 [Urine:700] Intake/Output this shift: Total I/O In: 240 [P.O.:240] Out: -   General appearance: alert, cooperative, and no distress Resp: normal work of breathing GI: soft, mildly distended, mildly tender RUQ   Lab Results:  Recent Labs    10/16/20 0451 10/17/20 0708  WBC 17.5* 15.1*  HGB 11.5* 11.3*  HCT 33.8* 34.1*  PLT 112* 124*   BMET Recent Labs    10/16/20 0451 10/17/20 0708  NA 134* 133*  K 3.6 3.5  CL 100 101  CO2 26 25  GLUCOSE 250* 119*  BUN 42* 55*  CREATININE 1.74* 1.99*  CALCIUM 8.1* 7.8*   PT/INR Recent Labs    10/15/20 2044  LABPROT 16.9*  INR 1.4*    Studies/Results: CT Chest W Contrast  Result Date: 10/15/2020 CLINICAL DATA:  Pneumonia, effusion or abscess suspected, xray done; Abdominal pain, acute, nonlocalized EXAM: CT CHEST, ABDOMEN, AND PELVIS WITH CONTRAST TECHNIQUE: Multidetector CT imaging of the chest, abdomen and pelvis was performed following the standard protocol during bolus administration of intravenous contrast. CONTRAST:  45m OMNIPAQUE IOHEXOL 300 MG/ML  SOLN COMPARISON:  Chest radiograph earlier today. Abdominopelvic CT 08/15/2020 FINDINGS: CT CHEST FINDINGS Cardiovascular: Atherosclerosis of the thoracic aorta. Upper normal heart size. There are coronary artery calcifications. No pericardial effusion. Mediastinum/Nodes:  No enlarged mediastinal or hilar lymph nodes. Patulous esophagus without wall thickening. No thyroid nodule. Lungs/Pleura: Small bilateral pleural effusions. Associated bibasilar atelectasis. No evidence of pneumonia. Breathing motion artifact limits detailed assessment. The previous 3 mm right lower lobe pulmonary nodule is obscured by atelectasis on the current exam. No findings of pulmonary edema. Musculoskeletal: Small sclerotic focus in the left glenoid, nonspecific. No acute osseous abnormalities. CT ABDOMEN PELVIS FINDINGS Hepatobiliary: The gallbladder is distended. Suspect pericholecystic fat stranding, however there is motion artifact through the gallbladder. There is mild generalized fat stranding in the right upper quadrant. Abnormal appearance of the liver adjacent to the gallbladder fossa with lobulated low-density that extends up to 12 mm into the adjacent liver parenchyma for example series 4, image 62. Suspect small subcapsular collection about the inferior liver tip measuring 4 x 1.7 cm, series 2, image 62, with additional small volume perihepatic ascites. Common bile duct is poorly defined, but not definitively distended. No definitive calcified gallstone. Pancreas: Atrophic. No definite peripancreatic fat stranding. No ductal dilatation. Spleen: Normal in size without focal abnormality. Adrenals/Urinary Tract: No adrenal nodule. There is early excretion of IV contrast in both renal collecting systems. No hydronephrosis. Limited assessment for renal calculi given contrast in the collecting systems. Cyst in the lateral left kidney, similar in appearance to prior. Hyperdense lesion in the mid right kidney is not well-defined on the current exam. Excreted IV contrast in the urinary bladder which is partially distended. Equivocal perivesicular fat stranding. Stomach/Bowel: The stomach is decompressed. There is fat stranding in the right upper quadrant adjacent to the distal  stomach and proximal  duodenum, detailed assessment is obscured by motion. Motion limits assessment for wall thickening in this region. There is a duodenal diverticulum. Fluid-filled nondilated distal small bowel. No obstruction. Normal appendix. Small to moderate volume of colonic stool. Left colonic diverticulosis. No diverticulitis. Vascular/Lymphatic: Aortic atherosclerosis. No aortic aneurysm. The portal vein is patent. There is no portal venous or mesenteric gas. Limited assessment for upper abdominal adenopathy due to motion. There is no retroperitoneal or pelvic adenopathy. Reproductive: Brachytherapy seeds in the prostate. Other: Fat stranding and inflammatory changes in the right upper quadrant. Small amount of perihepatic ascites. Questionable subcapsular collection is described. There is no free intra-abdominal air. No abdominal wall hernia. Musculoskeletal: Punctate bone island in the L5, unchanged. Lucent lesion within L1 vertebral body and pedicle is not significantly changed. Mixed density lesion in the right iliac bone near the sacroiliac joint is also stable. IMPRESSION: 1. Small bilateral pleural effusions with bibasilar atelectasis. 2. Aortic atherosclerosis is well as coronary artery calcifications. 3. Gallbladder distension. There is generalized fat stranding in the right upper quadrant the may be secondary to gallbladder inflammation. Motion artifact limits detailed assessment. Abnormal appearance of the liver and a adjacent to the gallbladder fossa, not seen on prior exams. This may represent extension of inflammatory changes or invasion. Suspected small subcapsular collection at the inferior liver tip measuring 4 x 1.7 cm. Small volume perihepatic ascites. Overall findings suggest complicated cholecystitis. Suggest initial assessment with ultrasound. While MRCP may be considered to further delineate the hepatic biliary changes, given inability to hold still for CT, MRI is not recommended at this time. 4. Colonic  diverticulosis without diverticulitis. Additional stable chronic findings as described Aortic Atherosclerosis (ICD10-I70.0). Electronically Signed   By: Keith Rake M.D.   On: 10/15/2020 23:18   CT Abdomen Pelvis W Contrast  Result Date: 10/15/2020 CLINICAL DATA:  Pneumonia, effusion or abscess suspected, xray done; Abdominal pain, acute, nonlocalized EXAM: CT CHEST, ABDOMEN, AND PELVIS WITH CONTRAST TECHNIQUE: Multidetector CT imaging of the chest, abdomen and pelvis was performed following the standard protocol during bolus administration of intravenous contrast. CONTRAST:  8m OMNIPAQUE IOHEXOL 300 MG/ML  SOLN COMPARISON:  Chest radiograph earlier today. Abdominopelvic CT 08/15/2020 FINDINGS: CT CHEST FINDINGS Cardiovascular: Atherosclerosis of the thoracic aorta. Upper normal heart size. There are coronary artery calcifications. No pericardial effusion. Mediastinum/Nodes: No enlarged mediastinal or hilar lymph nodes. Patulous esophagus without wall thickening. No thyroid nodule. Lungs/Pleura: Small bilateral pleural effusions. Associated bibasilar atelectasis. No evidence of pneumonia. Breathing motion artifact limits detailed assessment. The previous 3 mm right lower lobe pulmonary nodule is obscured by atelectasis on the current exam. No findings of pulmonary edema. Musculoskeletal: Small sclerotic focus in the left glenoid, nonspecific. No acute osseous abnormalities. CT ABDOMEN PELVIS FINDINGS Hepatobiliary: The gallbladder is distended. Suspect pericholecystic fat stranding, however there is motion artifact through the gallbladder. There is mild generalized fat stranding in the right upper quadrant. Abnormal appearance of the liver adjacent to the gallbladder fossa with lobulated low-density that extends up to 12 mm into the adjacent liver parenchyma for example series 4, image 62. Suspect small subcapsular collection about the inferior liver tip measuring 4 x 1.7 cm, series 2, image 62, with  additional small volume perihepatic ascites. Common bile duct is poorly defined, but not definitively distended. No definitive calcified gallstone. Pancreas: Atrophic. No definite peripancreatic fat stranding. No ductal dilatation. Spleen: Normal in size without focal abnormality. Adrenals/Urinary Tract: No adrenal nodule. There is early excretion of IV contrast in  both renal collecting systems. No hydronephrosis. Limited assessment for renal calculi given contrast in the collecting systems. Cyst in the lateral left kidney, similar in appearance to prior. Hyperdense lesion in the mid right kidney is not well-defined on the current exam. Excreted IV contrast in the urinary bladder which is partially distended. Equivocal perivesicular fat stranding. Stomach/Bowel: The stomach is decompressed. There is fat stranding in the right upper quadrant adjacent to the distal stomach and proximal duodenum, detailed assessment is obscured by motion. Motion limits assessment for wall thickening in this region. There is a duodenal diverticulum. Fluid-filled nondilated distal small bowel. No obstruction. Normal appendix. Small to moderate volume of colonic stool. Left colonic diverticulosis. No diverticulitis. Vascular/Lymphatic: Aortic atherosclerosis. No aortic aneurysm. The portal vein is patent. There is no portal venous or mesenteric gas. Limited assessment for upper abdominal adenopathy due to motion. There is no retroperitoneal or pelvic adenopathy. Reproductive: Brachytherapy seeds in the prostate. Other: Fat stranding and inflammatory changes in the right upper quadrant. Small amount of perihepatic ascites. Questionable subcapsular collection is described. There is no free intra-abdominal air. No abdominal wall hernia. Musculoskeletal: Punctate bone island in the L5, unchanged. Lucent lesion within L1 vertebral body and pedicle is not significantly changed. Mixed density lesion in the right iliac bone near the sacroiliac  joint is also stable. IMPRESSION: 1. Small bilateral pleural effusions with bibasilar atelectasis. 2. Aortic atherosclerosis is well as coronary artery calcifications. 3. Gallbladder distension. There is generalized fat stranding in the right upper quadrant the may be secondary to gallbladder inflammation. Motion artifact limits detailed assessment. Abnormal appearance of the liver and a adjacent to the gallbladder fossa, not seen on prior exams. This may represent extension of inflammatory changes or invasion. Suspected small subcapsular collection at the inferior liver tip measuring 4 x 1.7 cm. Small volume perihepatic ascites. Overall findings suggest complicated cholecystitis. Suggest initial assessment with ultrasound. While MRCP may be considered to further delineate the hepatic biliary changes, given inability to hold still for CT, MRI is not recommended at this time. 4. Colonic diverticulosis without diverticulitis. Additional stable chronic findings as described Aortic Atherosclerosis (ICD10-I70.0). Electronically Signed   By: Keith Rake M.D.   On: 10/15/2020 23:18   DG Chest Port 1 View  Result Date: 10/15/2020 CLINICAL DATA:  Fever and altered mental status. EXAM: PORTABLE CHEST 1 VIEW COMPARISON:  Chest radiograph dated 10/05/2018. FINDINGS: Shallow inspiration. Small left pleural effusion and left lung base atelectasis. Infiltrate is not excluded clinical correlation recommended. There is no pneumothorax. The cardiac silhouette is within normal limits. No acute osseous pathology. IMPRESSION: Small left pleural effusion and left lung base atelectasis versus infiltrate. Electronically Signed   By: Anner Crete M.D.   On: 10/15/2020 20:19   ECHOCARDIOGRAM COMPLETE  Result Date: 10/17/2020    ECHOCARDIOGRAM REPORT   Patient Name:   James Glover Date of Exam: 10/17/2020 Medical Rec #:  [id number]       Height:       75.0 in Accession #:    [id number]      Weight:       278.2 lb Date of  Birth:  February 16, 1946       BSA:          2.525 m Patient Age:    57 years        BP:           135/73 mmHg Patient Gender: M  HR:           66 bpm. Exam Location:  [facility] Procedure: 2D Echo, Cardiac Doppler and Color Doppler Indications:    Bacteremia  History:        Patient has prior history of Echocardiogram examinations, most                 recent 02/20/2010. Signs/Symptoms:Altered Mental Status and CKD,                 sepsis; Risk Factors:Diabetes, Hypertension, Dyslipidemia and                 Morbid obesity. Acute cholecystitis, pleural effusion.  Sonographer:    Dustin Flock RDCS Referring Phys: [place]  1. Left ventricular ejection fraction, by estimation, is 60 to 65%. The left ventricle has normal function. The left ventricle has no regional wall motion abnormalities. Left ventricular diastolic parameters were normal.  2. RV-RA gradient 38 mmHg suggests at least mildly elevated RVSP. Right ventricular systolic function is normal. The right ventricular size is normal.  3. There is a trivial pericardial effusion that is circumferential.  4. The mitral valve is grossly normal. Mild mitral valve regurgitation.  5. The aortic valve is tricuspid. There is mild calcification of the aortic valve. Aortic valve regurgitation is not visualized.  6. Unable to estimate CVP.  7. No obvious valvular vegetations. Comparison(s): No prior Echocardiogram. FINDINGS  Left Ventricle: Left ventricular ejection fraction, by estimation, is 60 to 65%. The left ventricle has normal function. The left ventricle has no regional wall motion abnormalities. The left ventricular internal cavity size was normal in size. There is  borderline left ventricular hypertrophy. Left ventricular diastolic parameters were normal. Right Ventricle: RV-RA gradient 38 mmHg suggests at least mildly elevated RVSP. The right ventricular size is normal. No increase in right ventricular wall thickness. Right  ventricular systolic function is normal. Left Atrium: Left atrial size was normal in size. Right Atrium: Right atrial size was normal in size. Pericardium: Trivial pericardial effusion is present. The pericardial effusion is circumferential. Mitral Valve: The mitral valve is grossly normal. Mild mitral annular calcification. Mild mitral valve regurgitation. Tricuspid Valve: The tricuspid valve is grossly normal. Tricuspid valve regurgitation is mild. Aortic Valve: The aortic valve is tricuspid. There is mild calcification of the aortic valve. There is mild aortic valve annular calcification. Aortic valve regurgitation is not visualized. Pulmonic Valve: The pulmonic valve was grossly normal. Pulmonic valve regurgitation is not visualized. Aorta: The aortic root is normal in size and structure. Venous: Unable to estimate CVP. The inferior vena cava was not well visualized. IAS/Shunts: No atrial level shunt detected by color flow Doppler.  LEFT VENTRICLE PLAX 2D LVIDd:         5.23 cm  Diastology LVIDs:         2.83 cm  LV e' medial:    6.92 cm/s LV PW:         1.06 cm  LV E/e' medial:  9.3 LV IVS:        1.04 cm  LV e' lateral:   8.32 cm/s LVOT diam:     2.20 cm  LV E/e' lateral: 7.7 LV SV:         101 LV SV Index:   40 LVOT Area:     3.80 cm  RIGHT VENTRICLE RV Basal diam:  3.28 cm RV S prime:     4.35 cm/s TAPSE (M-mode): 2.6 cm LEFT  ATRIUM             Index       RIGHT ATRIUM           Index LA diam:        3.70 cm 1.47 cm/m  RA Area:     19.60 cm LA Vol (A2C):   63.1 ml 24.99 ml/m RA Volume:   61.90 ml  24.52 ml/m LA Vol (A4C):   54.2 ml 21.47 ml/m LA Biplane Vol: 59.7 ml 23.64 ml/m  AORTIC VALVE LVOT Vmax:   115.00 cm/s LVOT Vmean:  77.200 cm/s LVOT VTI:    0.265 m  AORTA Ao Root diam: 3.10 cm MITRAL VALVE               TRICUSPID VALVE MV Area (PHT): 3.99 cm    TR Peak grad:   38.2 mmHg MV Decel Time: 190 msec    TR Vmax:        309.00 cm/s MV E velocity: 64.30 cm/s MV A velocity: 57.00 cm/s  SHUNTS MV  E/A ratio:  1.13        Systemic VTI:  0.26 m                            Systemic Diam: 2.20 cm Rozann Lesches MD Electronically signed by Rozann Lesches MD Signature Date/Time: 10/17/2020/11:15:33 AM    Final    US Abdomen Limited RUQ (LIVER/GB)  Result Date: 10/16/2020 CLINICAL DATA:  Acute cholecystitis. EXAM: ULTRASOUND ABDOMEN LIMITED RIGHT UPPER QUADRANT COMPARISON:  CT AP 10/15/2020 FINDINGS: Gallbladder: Gallbladder wall appears thickened. By my measurements the gallbladder measures up to 4 mm. There is mild pericholecystic fluid identified. No gallstones identified. Small volume of sludge noted within the gallbladder. Negative sonographic Murphy's sign reported. Common bile duct: Diameter: 3.5 mm Liver: Small fluid collection adjacent to the gallbladder in the small parenchymal fluid collections within segment 4 of the liver adjacent to the gallbladder are better seen on the CT from yesterday. Portal vein is patent on color Doppler imaging with normal direction of blood flow towards the liver. Other: None. IMPRESSION: 1. Gallbladder wall thickening, sludge and pericholecystic fluid identified. Cannot exclude acalculous cholecystitis. 2. Fluid collection adjacent to the gallbladder and intraparenchymal low-density foci within segment 4 (also adjacent to the gallbladder) are better seen on the contrast enhanced CT of the abdomen from 10/15/2020. Electronically Signed   By: Kerby Moors M.D.   On: 10/16/2020 11:19    Anti-infectives: Anti-infectives (From admission, onward)    Start     Dose/Rate Route Frequency Ordered Stop   10/16/20 2200  vancomycin (VANCOREADY) IVPB 1250 mg/250 mL  Status:  Discontinued        1,250 mg 166.7 mL/hr over 90 Minutes Intravenous Every 24 hours 10/16/20 0012 10/16/20 0127   10/16/20 1645  vancomycin (VANCOREADY) IVPB 1250 mg/250 mL  Status:  Discontinued        1,250 mg 166.7 mL/hr over 90 Minutes Intravenous Every 24 hours 10/16/20 1555 10/17/20 0935    10/16/20 1000  ceFEPIme (MAXIPIME) 2 g in sodium chloride 0.9 % 100 mL IVPB  Status:  Discontinued        2 g 200 mL/hr over 30 Minutes Intravenous Every 12 hours 10/16/20 0012 10/16/20 0127   10/16/20 0600  piperacillin-tazobactam (ZOSYN) IVPB 3.375 g        3.375 g 12.5 mL/hr over 240 Minutes Intravenous Every 8 hours 10/16/20 0127  10/15/20 2215  vancomycin (VANCOREADY) IVPB 2000 mg/400 mL        2,000 mg 200 mL/hr over 120 Minutes Intravenous  Once 10/15/20 2214 10/16/20 0133   10/15/20 2145  ceFEPIme (MAXIPIME) 2 g in sodium chloride 0.9 % 100 mL IVPB        2 g 200 mL/hr over 30 Minutes Intravenous  Once 10/15/20 2136 10/15/20 2301   10/15/20 2145  metroNIDAZOLE (FLAGYL) IVPB 500 mg        500 mg 100 mL/hr over 60 Minutes Intravenous  Once 10/15/20 2136 10/15/20 2301   10/15/20 2145  vancomycin (VANCOCIN) IVPB 1000 mg/200 mL premix  Status:  Discontinued        1,000 mg 200 mL/hr over 60 Minutes Intravenous  Once 10/15/20 2136 10/15/20 2213       Assessment/Plan: James Glover is a 75 yo with a liver abscess thought to be related to cholecystitis and bacteremia. Improving with antibiotics. At this time no plans for OR given complication rate with this extensive of disease but if worsens or no improvement may need cholecystomy tube or abscess drained, etc.  Will need the gallbladder out at some point in the future.    Antibiotics  Clear diet, adv as tolerated  Updated family and discussed with Dr. Bonner Puna.   LOS: 1 day    Virl Cagey 10/17/2020

## 2020-10-17 NOTE — Progress Notes (Signed)
  Echocardiogram 2D Echocardiogram has been performed.  James Glover 10/17/2020, 10:17 AM

## 2020-10-17 NOTE — Evaluation (Signed)
Physical Therapy Evaluation Patient Details Name: James Glover MRN: [id number] DOB: [birth year] Today's Date: 10/17/2020   History of Present Illness  James Glover  is a 75 y.o. male, with history of CKD, hyperlipidemia, GERD, prostate cancer status posttreatment-in remission, type 2 diabetes mellitus, and more presents the ED with a chief complaint of abdominal pain.  Patient has intermittent confusion during my exam, which limits history.  At first he is only oriented to self.  After phlebotomy has a couple attempts at sticking him for blood he is more alert, but still confused.  Wife helps with history as she can.  Patient reports that he has had belly pain that been going on for some time.  I think it started 4 days ago.  At first 4 days ago wife noticed he was extra fatigue.  Then 3 days ago he felt better, [place] were at the beach and he helped drive home.  Then 2 days ago he felt much worse.  They went to see an outpatient physician who gave him pain pills and sent him home.  That physician set up a right upper quadrant ultrasound for this coming Friday, and a HIDA scan for a week from today.  Patient then threw up yesterday and wife reports he had a "rough night."  She describes this as he was unable to sleep, complaining of abdominal pain, and just generally not feeling well.  On the day of presentation he was somnolent all day.  He had a weak cough.  He would not take pain medications, or get up out of bed.  Wife denies had any fever.  She reports she is not concerned about his cough as he does have a chronic cough from GERD.  He has not had any diarrhea.  Patient reports no other complaints.   Clinical Impression  Patient limited for functional mobility as stated below secondary to BLE weakness, fatigue and impaired standing balance. Patient requires assist to pull to seated to transition to EOB secondary to pain/weakness. Patient demonstrates good sitting tolerance and sitting balance EOB.  Patient requires assist to transfer to standing with RW and cueing for hand placement. Patient able to take several lateral steps at bedside with UE support on RW. Patient overall limited by fatigue today and is assisted back into bed at end of session. Patient will benefit from continued physical therapy in hospital and recommended venue below to increase strength, balance, endurance for safe ADLs and gait.     Follow Up Recommendations SNF    Equipment Recommendations  None recommended by PT    Recommendations for Other Services       Precautions / Restrictions Precautions Precautions: Fall Restrictions Weight Bearing Restrictions: No      Mobility  Bed Mobility Overal bed mobility: Needs Assistance Bed Mobility: Supine to Sit;Sit to Supine     Supine to sit: Mod assist Sit to supine: Min assist   General bed mobility comments: assist to pull to seated EOB and for LE movement back into bed    Transfers Overall transfer level: Needs assistance Equipment used: Rolling walker (2 wheeled) Transfers: Sit to/from Stand Sit to Stand: Mod assist         General transfer comment: cueing for hand placement, assist for LE weakness  Ambulation/Gait Ambulation/Gait assistance: Min assist Gait Distance (Feet): 2 Feet Assistive device: Rolling walker (2 wheeled)   Gait velocity: decreased   General Gait Details: heavy UE support on RW for several lateral steps at bedside  Stairs            Wheelchair Mobility    Modified Rankin (Stroke Patients Only)       Balance Overall balance assessment: Needs assistance Sitting-balance support: No upper extremity supported Sitting balance-[name] Scale: Good Sitting balance - Comments: seated EOB     Standing balance-[name] Scale: Fair Standing balance comment: using RW                             Pertinent Vitals/Pain Pain Assessment: 0-10 Pain Score: 10-Worst pain ever Pain Location: abdomen Pain  Descriptors / Indicators: Aching Pain Intervention(s): Limited activity within patient's tolerance;Monitored during session;Repositioned    Home Living Family/patient expects to be discharged to:: Private residence Living Arrangements: Spouse/significant other Available Help at Discharge: Family Type of Home: House Home Access: Stairs to enter Entrance Stairs-Rails: Psychiatric nurse of Steps: 5-6 Home Layout: Two level;Bed/bath upstairs;Able to live on main level with bedroom/bathroom Home Equipment: None      Prior Function Level of Independence: Independent         Comments: Patient states community ambulation without AD, independent with ADL     Hand Dominance        Extremity/Trunk Assessment   Upper Extremity Assessment Upper Extremity Assessment: Defer to OT evaluation    Lower Extremity Assessment Lower Extremity Assessment: Generalized weakness    Cervical / Trunk Assessment Cervical / Trunk Assessment: Normal  Communication   Communication: No difficulties  Cognition Arousal/Alertness: Awake/alert Behavior During Therapy: WFL for tasks assessed/performed Overall Cognitive Status: Within Functional Limits for tasks assessed                                        General Comments      Exercises     Assessment/Plan    PT Assessment Patient needs continued PT services  PT Problem List Decreased strength;Decreased mobility;Decreased activity tolerance;Decreased balance       PT Treatment Interventions DME instruction;Therapeutic exercise;Gait training;Balance training;Stair training;Neuromuscular re-education;Functional mobility training;Therapeutic activities;Patient/family education    PT Goals (Current goals can be found in the Care Plan section)  Acute Rehab PT Goals Patient Stated Goal: return home PT Goal Formulation: With patient Time For Goal Achievement: 10/31/20 Potential to Achieve Goals: Good     Frequency Min 3X/week   Barriers to discharge        Co-evaluation               AM-PAC PT "6 Clicks" Mobility  Outcome Measure Help needed turning from your back to your side while in a flat bed without using bedrails?: A Little Help needed moving from lying on your back to sitting on the side of a flat bed without using bedrails?: A Lot Help needed moving to and from a bed to a chair (including a wheelchair)?: A Lot Help needed standing up from a chair using your arms (e.g., wheelchair or bedside chair)?: A Lot Help needed to walk in hospital room?: A Lot Help needed climbing 3-5 steps with a railing? : A Lot 6 Click Score: 13    End of Session Equipment Utilized During Treatment: Gait belt Activity Tolerance: Patient tolerated treatment well;Patient limited by pain;Patient limited by fatigue Patient left: in bed;with call bell/phone within reach;with family/visitor present Nurse Communication: Mobility status PT Visit Diagnosis: Unsteadiness on feet (R26.81);Other abnormalities of gait and  mobility (R26.89);Muscle weakness (generalized) (M62.81)    Time: [id number] PT Time Calculation (min) (ACUTE ONLY): 37 min   Charges:   PT Evaluation $PT Eval Low Complexity: 1 Low PT Treatments $Therapeutic Activity: 23-37 mins        12:55 PM, 10/17/20 Mearl Latin PT, DPT Physical Therapist at [facility]

## 2020-10-17 NOTE — Plan of Care (Signed)
  Problem: Acute Rehab PT Goals(only PT should resolve) Goal: Pt Will Go Supine/Side To Sit Outcome: Progressing Flowsheets (Taken 10/17/2020 1257) Pt will go Supine/Side to Sit: with minimal assist Goal: Pt Will Go Sit To Supine/Side Outcome: Progressing Flowsheets (Taken 10/17/2020 1257) Pt will go Sit to Supine/Side: with minimal assist Goal: Patient Will Transfer Sit To/From Stand Outcome: Progressing Flowsheets (Taken 10/17/2020 1257) Patient will transfer sit to/from stand: with min guard assist Goal: Pt Will Transfer Bed To Chair/Chair To Bed Outcome: Progressing Flowsheets (Taken 10/17/2020 1257) Pt will Transfer Bed to Chair/Chair to Bed: min guard assist Goal: Pt Will Ambulate Outcome: Progressing Flowsheets (Taken 10/17/2020 1257) Pt will Ambulate:  25 feet  with min guard assist  with least restrictive assistive device Goal: Pt/caregiver will Perform Home Exercise Program Outcome: Progressing Flowsheets (Taken 10/17/2020 1257) Pt/caregiver will Perform Home Exercise Program:  For increased strengthening  For improved balance  Independently  12:58 PM, 10/17/20 Mearl Latin PT, DPT Physical Therapist at [facility]

## 2020-10-17 NOTE — TOC Progression Note (Signed)
Transition of Care [facility]) - Progression Note    Patient Details  Name: James Glover MRN: [id number] Date of Birth: Nov 21, 1945  Transition of Care [facility]) CM/SW Contact  Shade Flood, LCSW Phone Number: 10/17/2020, 12:36 PM  Clinical Narrative:     PT recommending SNF rehab for pt. TOC spoke with pt to discuss and pt is agreeable to SNF. CMS provider options discussed with pt. Will refer as requested. Pt will need insurance auth prior to transfer.   TOC will follow.   Expected Discharge Plan: Home/Self Care Barriers to Discharge: Continued Medical Work up  Expected Discharge Plan and Services Expected Discharge Plan: Home/Self Care In-house Referral: Clinical Social Work Discharge Planning Services: CM Consult   Living arrangements for the past 2 months: Single Family Home                                       Social Determinants of Health (SDOH) Interventions    Readmission Risk Interventions Readmission Risk Prevention Plan 10/16/2020  Transportation Screening Complete  HRI or Home Care Consult Complete  Social Work Consult for [place] Planning/Counseling Complete  Palliative Care Screening Not Applicable  Medication Review [employer]) Complete  Some recent data might be hidden

## 2020-10-17 NOTE — Progress Notes (Signed)
   A/p Acute cholecystitis Strep bovis bacteremia  Reviewed chart. Improved wbc/vitals General surgery following  Agree if continued improvement can continue treatment course with abx, but will need f/u surgery for gall bladder resection and determining if need for percutaneous biliary drain  Given the extent of disease including liver parenchymal abscess, would plan 4 weeks treatment  Tte no obvious sign of endocarditis   -can change pip-tazo to ampicillin-sulbactam -f/u repeat blood culture from 8/19; if that remains negative on 8/21, reasonable to change to oral antibiotics amox-clav -plan treatment duration 4 weeks until 9/16 (might need longer depending on subsequent evaluation)  -when clinically well, would need colonoscopy if congruent with goals of care to r/o colon cancer given strep bovis bacteremia  -f/u surgery outpatient -id clinic f/u as below -please call if further question    Clinic Follow Up Appt: 8/31 @ 2pm  @  [facility] [place], [place],  [address] Phone: [phone]

## 2020-10-18 LAB — CULTURE, BLOOD (ROUTINE X 2)
Special Requests: ADEQUATE
Special Requests: ADEQUATE

## 2020-10-18 LAB — BASIC METABOLIC PANEL
Anion gap: 11 (ref 5–15)
BUN: 59 mg/dL — ABNORMAL HIGH (ref 8–23)
CO2: 24 mmol/L (ref 22–32)
Calcium: 8 mg/dL — ABNORMAL LOW (ref 8.9–10.3)
Chloride: 97 mmol/L — ABNORMAL LOW (ref 98–111)
Creatinine, Ser: 2.08 mg/dL — ABNORMAL HIGH (ref 0.61–1.24)
GFR, Estimated: 33 mL/min — ABNORMAL LOW (ref >60)
Glucose, Bld: 156 mg/dL — ABNORMAL HIGH (ref 70–99)
Potassium: 3.4 mmol/L — ABNORMAL LOW (ref 3.5–5.1)
Sodium: 132 mmol/L — ABNORMAL LOW (ref 135–145)

## 2020-10-18 LAB — GLUCOSE, CAPILLARY
Glucose-Capillary: 157 mg/dL — ABNORMAL HIGH (ref 70–99)
Glucose-Capillary: 162 mg/dL — ABNORMAL HIGH (ref 70–99)
Glucose-Capillary: 159 mg/dL — ABNORMAL HIGH (ref 70–99)
Glucose-Capillary: 142 mg/dL — ABNORMAL HIGH (ref 70–99)

## 2020-10-18 MED ORDER — SODIUM CHLORIDE 0.9 % IV SOLN
3.0000 g | Freq: Four times a day (QID) | INTRAVENOUS | Status: DC
  Administered 2020-10-18 – 2020-10-20 (×6): 3 g via INTRAVENOUS
  Filled 2020-10-18 (×9): qty 8
  Filled 2020-10-18: qty 3

## 2020-10-18 MED ORDER — POTASSIUM CHLORIDE CRYS ER 20 MEQ PO TBCR
20.0000 meq | EXTENDED_RELEASE_TABLET | Freq: Once | ORAL | Status: AC
  Administered 2020-10-18: 20 meq via ORAL
  Filled 2020-10-18: qty 1

## 2020-10-18 MED ORDER — DOCUSATE SODIUM 100 MG PO CAPS
100.0000 mg | ORAL_CAPSULE | Freq: Two times a day (BID) | ORAL | Status: DC
  Administered 2020-10-18 (×2): 100 mg via ORAL
  Filled 2020-10-18 (×4): qty 1

## 2020-10-18 NOTE — Progress Notes (Signed)
PROGRESS NOTE  James Glover  [id number] DOB: [birth year] DOA: 10/15/2020 PCP: [name], MD   Brief Narrative: Chanden Steagall  is a 75 y.o. male, with history of stage III CKD, prostate CA in remission, T2DM, HTN, HLD and GERD who presented to the ED with 4 days of abdominal pain, fatigue/somnolence leadng him to lay in bed all day, and intermittent confusion. In the ED temperature 100.39F with leukocytosis (20k) and elevated lactic acid, thrombocytopenia (126k), SCr near baseline at 1.4, hyperglycemic without anion gap. CT chest and abdomen shows small bilateral pleural effusions, atherosclerosis, gallbladder distention and fat stranding in the right upper quadrant with perihepatic ascites, concerning for acute cholecystitis. Broad spectrum antibiotics were started for sepsis. Blood cultures drawn at admission have grown Streptococcus gallolyticus for which IV antibiotics have been continued with plans to convert to oral augmentin per infectious disease specialist once repeat blood cultures prove clearance. Surgery has been consulted, currently recommending medical therapy at this time.   Assessment & Plan: Active Problems:   Essential hypertension, benign   Dyslipidemia   Controlled type 2 diabetes mellitus with stage 3 chronic kidney disease, with long-term current use of insulin (HCC)   Acute cholecystitis   GERD (gastroesophageal reflux disease)   Liver abscess  Sepsis with lactic acidosis due to acute acalculous cholecystitis complicated by subcapsular liver abscess (4x1.7cm) and S. gallolyticus bacteremia: Lactic acid cleared. - Switch zosyn > unasyn with plans to convert to augmentin at discharge for 4 weeks (until 9/16), with plans to follow up at [facility] 8/31 at 2pm (documented in DC follow up) to determine whether prolonged course is required.  - Surgery following, recommending delayed operative management at this time. Size of fluid collection likely to be treatable by antibiotics  alone.  - Trend WBC and vital signs.  - Repeat blood cultures drawn 8/19 and NGTD - Colonoscopy recommended due to S. bovis/gallolyticus bacteremia. Not urgent.   Acute metabolic encephalopathy: Improving with treatment, suggesting sepsis as primary cause.  - Continue to minimize sedating medications - Continue delirium precautions.    LFT elevation: In setting of hepatic fluid collection. Improving mild elevation, Bilirubin normalized.    T2DM: HbA1c 7% in June 2022, at goal.  - Continue basal insulin, currently on levemir in substitute for tresiba 18u qHS, SSI to continue, suspect infection-related hyperglycemia. He remains at inpatient goal currently. - Hold saxagliptin, pioglitazone.    Stage IIIa CKD: Followed by Dr. Marval Regal.   - Slight worsening since admission, possibly contrast-induced nephropathy, vancomycin also a possible contributor (since DC'ed).  - Continue IVF today, recheck in AM. Making urine.    Hypokalemia:  - Supplement and monitor.   Thrombocytopenia: Stable. ?due to sepsis. - Continue monitoring in AM   HTN:  - Continue losartan.    HLD:  - LFTs not severely elevated, so continuing statin.    GERD:  - Continue acid suppression.    Prostate CA s/p radioactive seed implant 2020: Followed by Dr. Tresa Moore.  - Will continue home oxybutynin and flomax though these have conflicting mechanisms of action.  Weakness: Due to acute severe infection.  - PT, OT recommending rehabilitation.   Obesity: Estimated body mass index is 35.55 kg/m as calculated from the following:   Height as of this encounter: '6\' 3"'$  (1.905 m).   Weight as of this encounter: 129 kg.  DVT prophylaxis: Heparin Code Status: Full Family Communication: Wife at bedside Disposition Plan:  Status is: Inpatient  Remains inpatient appropriate because:Ongoing diagnostic testing needed not  appropriate for outpatient work up and Inpatient level of care appropriate due to severity of  illness  Dispo: The patient is from: Home              Anticipated d/c is to: SNF              Patient currently is not medically stable to d/c.   Difficult to place patient No  Consultants:  General surgery Infectious diseases  Procedures:  None  Antimicrobials: Vancomycin 8/17 - 8/18 Cefepime, flagyl 8/17 Zosyn 8/17 >>  Subjective: He's feeling better. No fevers, abdominal pain in the RUQ more distinctly now and with movement or palpation only. No BM, +flatus.   Objective: Vitals:   10/18/20 0458 10/18/20 0500 10/18/20 0531 10/18/20 1259  BP: (!) 160/70   132/85  Pulse: 72   75  Resp:    18  Temp:    98.3 F (36.8 C)  TempSrc:    Oral  SpO2: 99%   94%  Weight:  129.4 kg 129 kg   Height:        Intake/Output Summary (Last 24 hours) at 10/18/2020 1532 Last data filed at 10/18/2020 0900 Gross per 24 hour  Intake 1628.13 ml  Output 300 ml  Net 1328.13 ml   Filed Weights   10/17/20 0500 10/18/20 0500 10/18/20 0531  Weight: 126.2 kg 129.4 kg 129 kg    Gen: Pleasant, elderly male in no distress Pulm: Non-labored breathing. Clear bilaterally CV: Regular rate and rhythm. Occasional PACs and PVCs on telemetry review today with compensatory pause < 2 sec. 1st deg AVB noted. No murmur, rub, or gallop. No JVD, No pitting pedal edema. GI: Abdomen soft, mildly tender in RUQ without rebound, mildly distended, with normoactive bowel sounds. No organomegaly or masses felt. Ext: Warm, no deformities Skin: No rashes, lesions or ulcers on visualized skin Neuro: Alert and oriented. No focal neurological deficits. Psych: Judgement and insight appear normal. Mood & affect appropriate.   Data Reviewed: I have personally reviewed following labs and imaging studies  CBC: Recent Labs  Lab 10/15/20 2044 10/16/20 0451 10/17/20 0708  WBC 20.3* 17.5* 15.1*  NEUTROABS 18.6*  --   --   HGB 12.0* 11.5* 11.3*  HCT 36.7* 33.8* 34.1*  MCV 100.8* 98.3 98.6  PLT 126* 112* 124*   Basic  Metabolic Panel: Recent Labs  Lab 10/15/20 2044 10/16/20 0451 10/17/20 0708 10/18/20 0747  NA 133* 134* 133* 132*  K 3.7 3.6 3.5 3.4*  CL 100 100 101 97*  CO2 '22 26 25 24  '$ GLUCOSE 269* 250* 119* 156*  BUN 40* 42* 55* 59*  CREATININE 1.84* 1.74* 1.99* 2.08*  CALCIUM 8.1* 8.1* 7.8* 8.0*  MG  --  1.6*  --   --    GFR: Estimated Creatinine Clearance: 45.1 mL/min (A) (by C-G formula based on SCr of 2.08 mg/dL (H)). Liver Function Tests: Recent Labs  Lab 10/15/20 2044 10/16/20 0451 10/17/20 0708  AST 67* 67* 52*  ALT 66* 77* 76*  ALKPHOS 41 43 52  BILITOT 1.3* 1.0 0.8  PROT 6.3* 5.7* 5.8*  ALBUMIN 3.0* 2.4* 2.3*   No results for input(s): LIPASE, AMYLASE in the last 168 hours. No results for input(s): AMMONIA in the last 168 hours. Coagulation Profile: Recent Labs  Lab 10/15/20 2044  INR 1.4*   Cardiac Enzymes: No results for input(s): CKTOTAL, CKMB, CKMBINDEX, TROPONINI in the last 168 hours. BNP (last 3 results) No results for input(s): PROBNP in the last  8760 hours. HbA1C: No results for input(s): HGBA1C in the last 72 hours. CBG: Recent Labs  Lab 10/17/20 1123 10/17/20 1650 10/17/20 2109 10/18/20 0722 10/18/20 1108  GLUCAP 108* 174* 160* 157* 162*   Lipid Profile: No results for input(s): CHOL, HDL, LDLCALC, TRIG, CHOLHDL, LDLDIRECT in the last 72 hours. Thyroid Function Tests: No results for input(s): TSH, T4TOTAL, FREET4, T3FREE, THYROIDAB in the last 72 hours. Anemia Panel: No results for input(s): VITAMINB12, FOLATE, FERRITIN, TIBC, IRON, RETICCTPCT in the last 72 hours. Urine analysis:    Component Value Date/Time   COLORURINE AMBER (A) 10/16/2020 0036   APPEARANCEUR CLOUDY (A) 10/16/2020 0036   LABSPEC 1.041 (H) 10/16/2020 0036   LABSPEC 1.020 12/27/2018 1315   PHURINE 5.0 10/16/2020 0036   GLUCOSEU 50 (A) 10/16/2020 0036   HGBUR SMALL (A) 10/16/2020 0036   BILIRUBINUR NEGATIVE 10/16/2020 0036   BILIRUBINUR negative 12/27/2018 1315    BILIRUBINUR neg 06/23/2016 1050   KETONESUR 5 (A) 10/16/2020 0036   PROTEINUR 100 (A) 10/16/2020 0036   UROBILINOGEN negative (A) 06/23/2016 1050   NITRITE NEGATIVE 10/16/2020 0036   LEUKOCYTESUR NEGATIVE 10/16/2020 0036   Recent Results (from the past 240 hour(s))  Culture, blood (Routine x 2)     Status: Abnormal   Collection Time: 10/15/20  8:44 PM   Specimen: BLOOD  Result Value Ref Range Status   Specimen Description   Final    BLOOD BLOOD RIGHT HAND Performed at [facility], [address], [place], [place] [address]    Special Requests   Final    BOTTLES DRAWN AEROBIC AND ANAEROBIC Blood Culture adequate volume Performed at [facility], [address] [place], [place] [address]    Culture  Setup Time   Final    GRAM POSITIVE COCCI IN BOTH AEROBIC AND ANAEROBIC BOTTLES CRITICAL VALUE NOTED.  VALUE IS CONSISTENT WITH PREVIOUSLY REPORTED AND CALLED VALUE. Performed at [place] [place] TO, READ BACK BY AND VERIFIED WITH: B ROBERTSON RN 1856 10/16/20 A BROWNING    Culture (A)  Final    STREPTOCOCCUS GALLOLYTICUS SUSCEPTIBILITIES PERFORMED ON PREVIOUS CULTURE WITHIN THE LAST 5 DAYS. Performed at [facility], [place] [address], [place], [place] [address]    Report Status 10/18/2020 FINAL  Final  Culture, blood (Routine x 2)     Status: Abnormal   Collection Time: 10/15/20  8:44 PM   Specimen: BLOOD  Result Value Ref Range Status   Specimen Description   Final    BLOOD BLOOD LEFT HAND Performed at [facility], [address], [place], [place] [address]    Special Requests   Final    BOTTLES DRAWN AEROBIC AND ANAEROBIC Blood Culture adequate volume Performed at [facility], [address], [place], [place] [address]    Culture  Setup Time   Final    GRAM POSITIVE COCCI IN BOTH AEROBIC AND ANAEROBIC BOTTLES Gram Stain Report Called to,Read Back By and Verified With: OAKLEY,B AT 0945 ON 8.18.22 BY  RUCINSKI,B Performed at [place] ID to follow CRITICAL RESULT CALLED TO, READ BACK BY AND VERIFIED WITHLetitia Neri RN 1856 10/16/20 A BROWNING Performed at [facility], [place] [address], [place], [place] [address]    Culture STREPTOCOCCUS GALLOLYTICUS (A)  Final   Report Status 10/18/2020 FINAL  Final   Organism ID, Bacteria STREPTOCOCCUS GALLOLYTICUS  Final      Susceptibility   Streptococcus gallolyticus - MIC*  PENICILLIN 0.12 SENSITIVE Sensitive     CEFTRIAXONE 0.25 SENSITIVE Sensitive     ERYTHROMYCIN <=0.12 SENSITIVE Sensitive     LEVOFLOXACIN 2 SENSITIVE Sensitive     VANCOMYCIN <=0.12 SENSITIVE Sensitive     * STREPTOCOCCUS GALLOLYTICUS  Blood Culture ID Panel (Reflexed)     Status: Abnormal   Collection Time: 10/15/20  8:44 PM  Result Value Ref Range Status   Enterococcus faecalis NOT DETECTED NOT DETECTED Final   Enterococcus Faecium NOT DETECTED NOT DETECTED Final   Listeria monocytogenes NOT DETECTED NOT DETECTED Final   Staphylococcus species NOT DETECTED NOT DETECTED Final   Staphylococcus aureus (BCID) NOT DETECTED NOT DETECTED Final   Staphylococcus epidermidis NOT DETECTED NOT DETECTED Final   Staphylococcus lugdunensis NOT DETECTED NOT DETECTED Final   Streptococcus species DETECTED (A) NOT DETECTED Final    Comment: Not Enterococcus species, Streptococcus agalactiae, Streptococcus pyogenes, or Streptococcus pneumoniae. CRITICAL RESULT CALLED TO, READ BACK BY AND VERIFIED WITH: B ROBERTSON RN 1856 10/16/20 A BROWNING    Streptococcus agalactiae NOT DETECTED NOT DETECTED Final   Streptococcus pneumoniae NOT DETECTED NOT DETECTED Final   Streptococcus pyogenes NOT DETECTED NOT DETECTED Final   A.calcoaceticus-baumannii NOT DETECTED NOT DETECTED Final   Bacteroides fragilis NOT DETECTED NOT DETECTED Final   Enterobacterales NOT DETECTED NOT DETECTED Final   Enterobacter cloacae complex NOT DETECTED NOT DETECTED Final   Escherichia coli  NOT DETECTED NOT DETECTED Final   Klebsiella aerogenes NOT DETECTED NOT DETECTED Final   Klebsiella oxytoca NOT DETECTED NOT DETECTED Final   Klebsiella pneumoniae NOT DETECTED NOT DETECTED Final   Proteus species NOT DETECTED NOT DETECTED Final   Salmonella species NOT DETECTED NOT DETECTED Final   Serratia marcescens NOT DETECTED NOT DETECTED Final   Haemophilus influenzae NOT DETECTED NOT DETECTED Final   Neisseria meningitidis NOT DETECTED NOT DETECTED Final   Pseudomonas aeruginosa NOT DETECTED NOT DETECTED Final   Stenotrophomonas maltophilia NOT DETECTED NOT DETECTED Final   Candida albicans NOT DETECTED NOT DETECTED Final   Candida auris NOT DETECTED NOT DETECTED Final   Candida glabrata NOT DETECTED NOT DETECTED Final   Candida krusei NOT DETECTED NOT DETECTED Final   Candida parapsilosis NOT DETECTED NOT DETECTED Final   Candida tropicalis NOT DETECTED NOT DETECTED Final   Cryptococcus neoformans/gattii NOT DETECTED NOT DETECTED Final    Comment: Performed at [facility], [address] [place], [place] [address]  Resp Panel by RT-PCR (Flu A&B, Covid) Nasopharyngeal Swab     Status: None   Collection Time: 10/15/20  8:45 PM   Specimen: Nasopharyngeal Swab; Nasopharyngeal(NP) swabs in vial transport medium  Result Value Ref Range Status   SARS Coronavirus 2 by RT PCR NEGATIVE NEGATIVE Final    Comment: (NOTE) SARS-CoV-2 target nucleic acids are NOT DETECTED.  The SARS-CoV-2 RNA is generally detectable in upper respiratory specimens during the acute phase of infection. The lowest concentration of SARS-CoV-2 viral copies this assay can detect is 138 copies/mL. A negative result does not preclude SARS-Cov-2 infection and should not be used as the sole basis for treatment or other patient management decisions. A negative result may occur with  improper specimen collection/handling, submission of specimen other than nasopharyngeal swab, presence of viral mutation(s)  within the areas targeted by this assay, and inadequate number of viral copies(<138 copies/mL). A negative result must be combined with clinical observations, patient history, and epidemiological information. The expected result is Negative.  Fact Sheet for Patients:  EntrepreneurPulse.com.au  Fact Sheet  for Healthcare Providers:  IncredibleEmployment.be  This test is no t yet approved or cleared by the [place] and  has been authorized for detection and/or diagnosis of SARS-CoV-2 by FDA under an Emergency Use Authorization (EUA). This EUA will remain  in effect (meaning this test can be used) for the duration of the COVID-19 declaration under Section 564(b)(1) of the Act, 21 U.S.C.section 360bbb-3(b)(1), unless the authorization is terminated  or revoked sooner.       Influenza A by PCR NEGATIVE NEGATIVE Final   Influenza B by PCR NEGATIVE NEGATIVE Final    Comment: (NOTE) The Xpert Xpress SARS-CoV-2/FLU/RSV plus assay is intended as an aid in the diagnosis of influenza from Nasopharyngeal swab specimens and should not be used as a sole basis for treatment. Nasal washings and aspirates are unacceptable for Xpert Xpress SARS-CoV-2/FLU/RSV testing.  Fact Sheet for Patients: EntrepreneurPulse.com.au  Fact Sheet for Healthcare Providers: IncredibleEmployment.be  This test is not yet approved or cleared by the [place] FDA and has been authorized for detection and/or diagnosis of SARS-CoV-2 by FDA under an Emergency Use Authorization (EUA). This EUA will remain in effect (meaning this test can be used) for the duration of the COVID-19 declaration under Section 564(b)(1) of the Act, 21 U.S.C. section 360bbb-3(b)(1), unless the authorization is terminated or revoked.  Performed at [facility], [address] [place], [place] [address]   Urine Culture     Status: Abnormal   Collection Time:  10/16/20 12:36 AM   Specimen: In/Out Cath Urine  Result Value Ref Range Status   Specimen Description   Final    IN/OUT CATH URINE Performed at [facility], [address], [place], [place] [address]    Special Requests   Final    NONE Performed at [facility], [address], [place], [place] [address]    Culture MULTIPLE SPECIES PRESENT, SUGGEST RECOLLECTION (A)  Final   Report Status 10/17/2020 FINAL  Final  Culture, blood (routine x 2)     Status: None (Preliminary result)   Collection Time: 10/17/20  7:09 AM   Specimen: BLOOD  Result Value Ref Range Status   Specimen Description BLOOD LEFT ANTECUBITAL  Final   Special Requests   Final    BOTTLES DRAWN AEROBIC AND ANAEROBIC Blood Culture adequate volume   Culture   Final    NO GROWTH 1 DAY Performed at [facility], [address] [place], [place] [address]    Report Status PENDING  Incomplete  Culture, blood (routine x 2)     Status: None (Preliminary result)   Collection Time: 10/17/20  7:10 AM   Specimen: BLOOD  Result Value Ref Range Status   Specimen Description BLOOD RIGHT ANTECUBITAL  Final   Special Requests   Final    BOTTLES DRAWN AEROBIC AND ANAEROBIC Blood Culture adequate volume   Culture   Final    NO GROWTH 1 DAY Performed at [facility], [address] [place], [place] [address]    Report Status PENDING  Incomplete      Radiology Studies: ECHOCARDIOGRAM COMPLETE  Result Date: 10/17/2020    ECHOCARDIOGRAM REPORT   Patient Name:   MALEIK GULLA Date of Exam: 10/17/2020 Medical Rec #:  [id number]       Height:       75.0 in Accession #:    [id number]      Weight:       278.2 lb Date of Birth:  [birth year]  BSA:          2.525 m Patient Age:    83 years        BP:           135/73 mmHg Patient Gender: M               HR:           66 bpm. Exam Location:  [facility] Procedure: 2D Echo, Cardiac Doppler and Color Doppler Indications:    Bacteremia  History:        Patient has prior history of Echocardiogram  examinations, most                 recent 02/20/2010. Signs/Symptoms:Altered Mental Status and CKD,                 sepsis; Risk Factors:Diabetes, Hypertension, Dyslipidemia and                 Morbid obesity. Acute cholecystitis, pleural effusion.  Sonographer:    Dustin Flock RDCS Referring Phys: [place]  1. Left ventricular ejection fraction, by estimation, is 60 to 65%. The left ventricle has normal function. The left ventricle has no regional wall motion abnormalities. Left ventricular diastolic parameters were normal.  2. RV-RA gradient 38 mmHg suggests at least mildly elevated RVSP. Right ventricular systolic function is normal. The right ventricular size is normal.  3. There is a trivial pericardial effusion that is circumferential.  4. The mitral valve is grossly normal. Mild mitral valve regurgitation.  5. The aortic valve is tricuspid. There is mild calcification of the aortic valve. Aortic valve regurgitation is not visualized.  6. Unable to estimate CVP.  7. No obvious valvular vegetations. Comparison(s): No prior Echocardiogram. FINDINGS  Left Ventricle: Left ventricular ejection fraction, by estimation, is 60 to 65%. The left ventricle has normal function. The left ventricle has no regional wall motion abnormalities. The left ventricular internal cavity size was normal in size. There is  borderline left ventricular hypertrophy. Left ventricular diastolic parameters were normal. Right Ventricle: RV-RA gradient 38 mmHg suggests at least mildly elevated RVSP. The right ventricular size is normal. No increase in right ventricular wall thickness. Right ventricular systolic function is normal. Left Atrium: Left atrial size was normal in size. Right Atrium: Right atrial size was normal in size. Pericardium: Trivial pericardial effusion is present. The pericardial effusion is circumferential. Mitral Valve: The mitral valve is grossly normal. Mild mitral annular calcification. Mild  mitral valve regurgitation. Tricuspid Valve: The tricuspid valve is grossly normal. Tricuspid valve regurgitation is mild. Aortic Valve: The aortic valve is tricuspid. There is mild calcification of the aortic valve. There is mild aortic valve annular calcification. Aortic valve regurgitation is not visualized. Pulmonic Valve: The pulmonic valve was grossly normal. Pulmonic valve regurgitation is not visualized. Aorta: The aortic root is normal in size and structure. Venous: Unable to estimate CVP. The inferior vena cava was not well visualized. IAS/Shunts: No atrial level shunt detected by color flow Doppler.  LEFT VENTRICLE PLAX 2D LVIDd:         5.23 cm  Diastology LVIDs:         2.83 cm  LV e' medial:    6.92 cm/s LV PW:         1.06 cm  LV E/e' medial:  9.3 LV IVS:        1.04 cm  LV e' lateral:   8.32 cm/s LVOT diam:  2.20 cm  LV E/e' lateral: 7.7 LV SV:         101 LV SV Index:   40 LVOT Area:     3.80 cm  RIGHT VENTRICLE RV Basal diam:  3.28 cm RV S prime:     4.35 cm/s TAPSE (M-mode): 2.6 cm LEFT ATRIUM             Index       RIGHT ATRIUM           Index LA diam:        3.70 cm 1.47 cm/m  RA Area:     19.60 cm LA Vol (A2C):   63.1 ml 24.99 ml/m RA Volume:   61.90 ml  24.52 ml/m LA Vol (A4C):   54.2 ml 21.47 ml/m LA Biplane Vol: 59.7 ml 23.64 ml/m  AORTIC VALVE LVOT Vmax:   115.00 cm/s LVOT Vmean:  77.200 cm/s LVOT VTI:    0.265 m  AORTA Ao Root diam: 3.10 cm MITRAL VALVE               TRICUSPID VALVE MV Area (PHT): 3.99 cm    TR Peak grad:   38.2 mmHg MV Decel Time: 190 msec    TR Vmax:        309.00 cm/s MV E velocity: 64.30 cm/s MV A velocity: 57.00 cm/s  SHUNTS MV E/A ratio:  1.13        Systemic VTI:  0.26 m                            Systemic Diam: 2.20 cm Rozann Lesches MD Electronically signed by Rozann Lesches MD Signature Date/Time: 10/17/2020/11:15:33 AM    Final     Scheduled Meds:  aspirin  81 mg Oral Daily   docusate sodium  100 mg Oral BID   famotidine  40 mg Oral QHS    heparin  5,000 Units Subcutaneous Q8H   insulin aspart  0-15 Units Subcutaneous TID WC   insulin aspart  0-5 Units Subcutaneous QHS   insulin detemir  10 Units Subcutaneous QHS   losartan  50 mg Oral Daily   oxybutynin  5 mg Oral BID   pantoprazole  40 mg Oral Daily   simvastatin  20 mg Oral q1800   tamsulosin  0.4 mg Oral BID PC   Continuous Infusions:  lactated ringers 50 mL/hr at 10/17/20 1359   piperacillin-tazobactam 3.375 g (10/18/20 0524)     LOS: 2 days   Time spent: 25 minutes.  Patrecia Pour, MD [facility] [email or web address] 10/18/2020, 3:32 PM

## 2020-10-18 NOTE — Progress Notes (Signed)
Pharmacy Antibiotic Note  James Glover is a 75 y.o. male admitted on 10/15/2020 with bacteremia.  Pharmacy has been consulted for Unasyn dosing. f/u repeat blood culture from 8/19; if that remains negative on 8/21, reasonable to change to oral antibiotics amox-clav -plan treatment duration 4 weeks until 9/16 Plan: Unasyn 3gm IV q6h F/U cxs and clinical progress Monitor V/S, labs  Height: '6\' 3"'$  (190.5 cm) Weight: 129 kg (284 lb 6.3 oz) IBW/kg (Calculated) : 84.5  Temp (24hrs), Avg:98.4 F (36.9 C), Min:98.3 F (36.8 C), Max:98.5 F (36.9 C)  Recent Labs  Lab 10/15/20 2044 10/15/20 2221 10/16/20 0451 10/16/20 0644 10/17/20 0708 10/18/20 0747  WBC 20.3*  --  17.5*  --  15.1*  --   CREATININE 1.84*  --  1.74*  --  1.99* 2.08*  LATICACIDVEN 2.0* 2.2*  --  1.2  --   --     Estimated Creatinine Clearance: 45.1 mL/min (A) (by C-G formula based on SCr of 2.08 mg/dL (H)).    Allergies  Allergen Reactions   Ace Inhibitors Cough    Antimicrobials this admission: Zosyn  8/18 >> 8/20 Vanc/Cefepimec8/17>> 8/18 Unasyn 8/18>>  Microbiology results: 8/17 BCx: strep gallolyticus pan sensitive 8/19 BCX: ngtd 8/17 UCx: mulitple species   Thank you for allowing pharmacy to be a part of this patient's care.  Isac Sarna, BS [place], [place] Clinical Pharmacist Pager [phone]  10/18/2020 4:41 PM

## 2020-10-18 NOTE — Progress Notes (Signed)
[facility] Progress Note     Subjective: Tolerating clears. No BM. No nausea. Will need SNF. ID says he can do 4 weeks antibiotics with Augmentin at d/c and will need colonoscopy due to strep bovis bacteremia.   Objective: Vital signs in last 24 hours: Temp:  [98.1 F (36.7 C)-98.5 F (36.9 C)] 98.5 F (36.9 C) (08/19 2114) Pulse Rate:  [70-72] 72 (08/20 0458) Resp:  [20] 20 (08/19 2114) BP: (119-160)/(53-70) 160/70 (08/20 0458) SpO2:  [94 %-99 %] 99 % (08/20 0458) Weight:  [129 kg-129.4 kg] 129 kg (08/20 0531) Last BM Date: 10/15/20  Intake/Output from previous day: 08/19 0701 - 08/20 0700 In: 1860 [P.O.:1440; I.V.:220.2; IV Piggyback:199.9] Out: 300 [Urine:300] Intake/Output this shift: Total I/O In: 720 [P.O.:720] Out: -   General appearance: alert, cooperative, and no distress Resp: normal work of breathing GI: soft, distended, minimally tender in RUQ  Lab Results:  Recent Labs    10/16/20 0451 10/17/20 0708  WBC 17.5* 15.1*  HGB 11.5* 11.3*  HCT 33.8* 34.1*  PLT 112* 124*   BMET Recent Labs    10/17/20 0708 10/18/20 0747  NA 133* 132*  K 3.5 3.4*  CL 101 97*  CO2 25 24  GLUCOSE 119* 156*  BUN 55* 59*  CREATININE 1.99* 2.08*  CALCIUM 7.8* 8.0*   PT/INR Recent Labs    10/15/20 2044  LABPROT 16.9*  INR 1.4*    Studies/Results: ECHOCARDIOGRAM COMPLETE  Result Date: 10/17/2020    ECHOCARDIOGRAM REPORT   Patient Name:   TITO PARKHOUSE Date of Exam: 10/17/2020 Medical Rec #:  [id number]       Height:       75.0 in Accession #:    [id number]      Weight:       278.2 lb Date of Birth:  1945/06/19       BSA:          2.525 m Patient Age:    12 years        BP:           135/73 mmHg Patient Gender: M               HR:           66 bpm. Exam Location:  [facility] Procedure: 2D Echo, Cardiac Doppler and Color Doppler Indications:    Bacteremia  History:        Patient has prior history of Echocardiogram examinations, most                  recent 02/20/2010. Signs/Symptoms:Altered Mental Status and CKD,                 sepsis; Risk Factors:Diabetes, Hypertension, Dyslipidemia and                 Morbid obesity. Acute cholecystitis, pleural effusion.  Sonographer:    Dustin Flock RDCS Referring Phys: [place]  1. Left ventricular ejection fraction, by estimation, is 60 to 65%. The left ventricle has normal function. The left ventricle has no regional wall motion abnormalities. Left ventricular diastolic parameters were normal.  2. RV-RA gradient 38 mmHg suggests at least mildly elevated RVSP. Right ventricular systolic function is normal. The right ventricular size is normal.  3. There is a trivial pericardial effusion that is circumferential.  4. The mitral valve is grossly normal. Mild mitral valve regurgitation.  5. The aortic valve is tricuspid. There is mild calcification  of the aortic valve. Aortic valve regurgitation is not visualized.  6. Unable to estimate CVP.  7. No obvious valvular vegetations. Comparison(s): No prior Echocardiogram. FINDINGS  Left Ventricle: Left ventricular ejection fraction, by estimation, is 60 to 65%. The left ventricle has normal function. The left ventricle has no regional wall motion abnormalities. The left ventricular internal cavity size was normal in size. There is  borderline left ventricular hypertrophy. Left ventricular diastolic parameters were normal. Right Ventricle: RV-RA gradient 38 mmHg suggests at least mildly elevated RVSP. The right ventricular size is normal. No increase in right ventricular wall thickness. Right ventricular systolic function is normal. Left Atrium: Left atrial size was normal in size. Right Atrium: Right atrial size was normal in size. Pericardium: Trivial pericardial effusion is present. The pericardial effusion is circumferential. Mitral Valve: The mitral valve is grossly normal. Mild mitral annular calcification. Mild mitral valve regurgitation.  Tricuspid Valve: The tricuspid valve is grossly normal. Tricuspid valve regurgitation is mild. Aortic Valve: The aortic valve is tricuspid. There is mild calcification of the aortic valve. There is mild aortic valve annular calcification. Aortic valve regurgitation is not visualized. Pulmonic Valve: The pulmonic valve was grossly normal. Pulmonic valve regurgitation is not visualized. Aorta: The aortic root is normal in size and structure. Venous: Unable to estimate CVP. The inferior vena cava was not well visualized. IAS/Shunts: No atrial level shunt detected by color flow Doppler.  LEFT VENTRICLE PLAX 2D LVIDd:         5.23 cm  Diastology LVIDs:         2.83 cm  LV e' medial:    6.92 cm/s LV PW:         1.06 cm  LV E/e' medial:  9.3 LV IVS:        1.04 cm  LV e' lateral:   8.32 cm/s LVOT diam:     2.20 cm  LV E/e' lateral: 7.7 LV SV:         101 LV SV Index:   40 LVOT Area:     3.80 cm  RIGHT VENTRICLE RV Basal diam:  3.28 cm RV S prime:     4.35 cm/s TAPSE (M-mode): 2.6 cm LEFT ATRIUM             Index       RIGHT ATRIUM           Index LA diam:        3.70 cm 1.47 cm/m  RA Area:     19.60 cm LA Vol (A2C):   63.1 ml 24.99 ml/m RA Volume:   61.90 ml  24.52 ml/m LA Vol (A4C):   54.2 ml 21.47 ml/m LA Biplane Vol: 59.7 ml 23.64 ml/m  AORTIC VALVE LVOT Vmax:   115.00 cm/s LVOT Vmean:  77.200 cm/s LVOT VTI:    0.265 m  AORTA Ao Root diam: 3.10 cm MITRAL VALVE               TRICUSPID VALVE MV Area (PHT): 3.99 cm    TR Peak grad:   38.2 mmHg MV Decel Time: 190 msec    TR Vmax:        309.00 cm/s MV E velocity: 64.30 cm/s MV A velocity: 57.00 cm/s  SHUNTS MV E/A ratio:  1.13        Systemic VTI:  0.26 m  Systemic Diam: 2.20 cm Rozann Lesches MD Electronically signed by Rozann Lesches MD Signature Date/Time: 10/17/2020/11:15:33 AM    Final     Anti-infectives: Anti-infectives (From admission, onward)    Start     Dose/Rate Route Frequency Ordered Stop   10/16/20 2200  vancomycin  (VANCOREADY) IVPB 1250 mg/250 mL  Status:  Discontinued        1,250 mg 166.7 mL/hr over 90 Minutes Intravenous Every 24 hours 10/16/20 0012 10/16/20 0127   10/16/20 1645  vancomycin (VANCOREADY) IVPB 1250 mg/250 mL  Status:  Discontinued        1,250 mg 166.7 mL/hr over 90 Minutes Intravenous Every 24 hours 10/16/20 1555 10/17/20 0935   10/16/20 1000  ceFEPIme (MAXIPIME) 2 g in sodium chloride 0.9 % 100 mL IVPB  Status:  Discontinued        2 g 200 mL/hr over 30 Minutes Intravenous Every 12 hours 10/16/20 0012 10/16/20 0127   10/16/20 0600  piperacillin-tazobactam (ZOSYN) IVPB 3.375 g        3.375 g 12.5 mL/hr over 240 Minutes Intravenous Every 8 hours 10/16/20 0127     10/15/20 2215  vancomycin (VANCOREADY) IVPB 2000 mg/400 mL        2,000 mg 200 mL/hr over 120 Minutes Intravenous  Once 10/15/20 2214 10/16/20 0133   10/15/20 2145  ceFEPIme (MAXIPIME) 2 g in sodium chloride 0.9 % 100 mL IVPB        2 g 200 mL/hr over 30 Minutes Intravenous  Once 10/15/20 2136 10/15/20 2301   10/15/20 2145  metroNIDAZOLE (FLAGYL) IVPB 500 mg        500 mg 100 mL/hr over 60 Minutes Intravenous  Once 10/15/20 2136 10/15/20 2301   10/15/20 2145  vancomycin (VANCOCIN) IVPB 1000 mg/200 mL premix  Status:  Discontinued        1,000 mg 200 mL/hr over 60 Minutes Intravenous  Once 10/15/20 2136 10/15/20 2213       Assessment/Plan: Mr. James Glover is a 75 yo with acute acalculus cholecystitis, bacteremia, liver abscess that will need 4 weeks of antibiotics per ID. He is doing well but still no BM or flatus. No nausea. Full liquid diet, colace, adv diet as tolerated Dr. Arnoldo Morale will see tomorrow Dc to SNF once on diet and BM and transition to oral I can see as outpatient to discuss the Cholecystectomy in the future Will need colonoscopy per ID due to Strep Bovis Bacteremia    LOS: 2 days    Virl Cagey 10/18/2020

## 2020-10-19 LAB — CBC WITH DIFFERENTIAL/PLATELET
Abs Immature Granulocytes: 0.3 10*3/uL — ABNORMAL HIGH (ref 0.00–0.07)
Basophils Absolute: 0.1 10*3/uL (ref 0.0–0.1)
Basophils Relative: 0 %
Eosinophils Absolute: 0.1 10*3/uL (ref 0.0–0.5)
Eosinophils Relative: 0 %
HCT: 36.7 % — ABNORMAL LOW (ref 39.0–52.0)
Hemoglobin: 12.5 g/dL — ABNORMAL LOW (ref 13.0–17.0)
Immature Granulocytes: 2 %
Lymphocytes Relative: 3 %
Lymphs Abs: 0.6 10*3/uL — ABNORMAL LOW (ref 0.7–4.0)
MCH: 32.7 pg (ref 26.0–34.0)
MCHC: 34.1 g/dL (ref 30.0–36.0)
MCV: 96.1 fL (ref 80.0–100.0)
Monocytes Absolute: 1.2 10*3/uL — ABNORMAL HIGH (ref 0.1–1.0)
Monocytes Relative: 7 %
Neutro Abs: 16 10*3/uL — ABNORMAL HIGH (ref 1.7–7.7)
Neutrophils Relative %: 88 %
Platelets: 148 10*3/uL — ABNORMAL LOW (ref 150–400)
RBC: 3.82 MIL/uL — ABNORMAL LOW (ref 4.22–5.81)
RDW: 13.2 % (ref 11.5–15.5)
WBC: 18.2 10*3/uL — ABNORMAL HIGH (ref 4.0–10.5)
nRBC: 0 % (ref 0.0–0.2)

## 2020-10-19 LAB — COMPREHENSIVE METABOLIC PANEL
ALT: 52 U/L — ABNORMAL HIGH (ref 0–44)
AST: 36 U/L (ref 15–41)
Albumin: 2.1 g/dL — ABNORMAL LOW (ref 3.5–5.0)
Alkaline Phosphatase: 74 U/L (ref 38–126)
Anion gap: 9 (ref 5–15)
BUN: 52 mg/dL — ABNORMAL HIGH (ref 8–23)
CO2: 23 mmol/L (ref 22–32)
Calcium: 7.7 mg/dL — ABNORMAL LOW (ref 8.9–10.3)
Chloride: 98 mmol/L (ref 98–111)
Creatinine, Ser: 1.84 mg/dL — ABNORMAL HIGH (ref 0.61–1.24)
GFR, Estimated: 38 mL/min — ABNORMAL LOW (ref >60)
Glucose, Bld: 154 mg/dL — ABNORMAL HIGH (ref 70–99)
Potassium: 3.3 mmol/L — ABNORMAL LOW (ref 3.5–5.1)
Sodium: 130 mmol/L — ABNORMAL LOW (ref 135–145)
Total Bilirubin: 1 mg/dL (ref 0.3–1.2)
Total Protein: 5.7 g/dL — ABNORMAL LOW (ref 6.5–8.1)

## 2020-10-19 LAB — GLUCOSE, CAPILLARY
Glucose-Capillary: 182 mg/dL — ABNORMAL HIGH (ref 70–99)
Glucose-Capillary: 190 mg/dL — ABNORMAL HIGH (ref 70–99)
Glucose-Capillary: 180 mg/dL — ABNORMAL HIGH (ref 70–99)
Glucose-Capillary: 179 mg/dL — ABNORMAL HIGH (ref 70–99)

## 2020-10-19 MED ORDER — POTASSIUM CHLORIDE CRYS ER 20 MEQ PO TBCR
40.0000 meq | EXTENDED_RELEASE_TABLET | Freq: Once | ORAL | Status: AC
  Administered 2020-10-19: 40 meq via ORAL
  Filled 2020-10-19: qty 2

## 2020-10-19 MED ORDER — SODIUM CHLORIDE 0.9 % IV SOLN
INTRAVENOUS | Status: DC | PRN
  Administered 2020-10-19: 500 mL via INTRAVENOUS

## 2020-10-19 NOTE — Progress Notes (Signed)
PROGRESS NOTE  James Glover  [id number] DOB: November 29, 1945 DOA: 10/15/2020 PCP: [name], MD   Brief Narrative: James Glover  is a 75 y.o. male, with history of stage III CKD, prostate CA in remission, T2DM, HTN, HLD and GERD who presented to the ED with 4 days of abdominal pain, fatigue/somnolence leadng him to lay in bed all day, and intermittent confusion. In the ED temperature 100.48F with leukocytosis (20k) and elevated lactic acid, thrombocytopenia (126k), SCr near baseline at 1.4, hyperglycemic without anion gap. CT chest and abdomen shows small bilateral pleural effusions, atherosclerosis, gallbladder distention and fat stranding in the right upper quadrant with perihepatic ascites, concerning for acute cholecystitis. Broad spectrum antibiotics were started for sepsis. Blood cultures drawn at admission have grown Streptococcus gallolyticus for which IV antibiotics have been continued with plans to convert to oral augmentin per infectious disease specialist once repeat blood cultures prove clearance. Surgery has been consulted, currently recommending medical therapy at this time.   Assessment & Plan: Active Problems:   Essential hypertension, benign   Dyslipidemia   Controlled type 2 diabetes mellitus with stage 3 chronic kidney disease, with long-term current use of insulin (HCC)   Acute cholecystitis   GERD (gastroesophageal reflux disease)   Liver abscess  Sepsis with lactic acidosis due to acute acalculous cholecystitis complicated by subcapsular liver abscess (4x1.7cm) and S. gallolyticus bacteremia: Lactic acid cleared. - Switched zosyn > unasyn with plans to convert to augmentin at discharge for 4 weeks (until 9/16), with plans to follow up at [facility] 8/31 at 2pm (documented in DC follow up) to determine whether prolonged course is required.  - Surgery following, recommending delayed operative management at this time. Size of fluid collection likely to be treatable by antibiotics  alone.  - Leukocytosis is refractory on AM labs, though fever curve stable. Will recheck in AM. - Repeat blood cultures drawn 8/19 and remain NGTD on last check this morning. - Colonoscopy recommended due to S. bovis/gallolyticus bacteremia. Not urgent.   Acute metabolic encephalopathy: Improving with treatment, suggesting sepsis as primary cause.  - Continue to minimize sedating medications - Continue delirium precautions.    LFT elevation: In setting of hepatic fluid collection. Improving mild elevation, Bilirubin normalized.    T2DM: HbA1c 7% in June 2022, at goal.  - Continue basal insulin, currently on levemir in substitute for tresiba 18u qHS, SSI to continue, suspect infection-related hyperglycemia. Remains at inpatient goal currently. - Hold saxagliptin, pioglitazone.    Stage IIIa CKD: Followed by Dr. Marval Regal.   - Slight worsening since admission, possibly contrast-induced nephropathy, vancomycin also a possible contributor (since DC'ed) and beginning to improve. Can stop IVF.   Hypokalemia:  - Supplement again and monitor.   Thrombocytopenia: Improving. ?due to sepsis. - Continue monitoring in AM   HTN:  - Continue losartan.    HLD:  - LFTs not severely elevated, continuing to improve, so continuing statin.    GERD:  - Continue acid suppression.    Prostate CA s/p radioactive seed implant 2020: Followed by Dr. Tresa Moore.  - Will continue home oxybutynin and flomax though these have conflicting mechanisms of action.  Weakness: Due to acute severe infection.  - PT, OT recommending rehabilitation. This is being pursued.   Obesity: Estimated body mass index is 35.55 kg/m as calculated from the following:   Height as of this encounter: '6\' 3"'$  (1.905 m).   Weight as of this encounter: 129 kg.  DVT prophylaxis: Heparin Code Status: Full Family Communication: None  at bedside this AM. Disposition Plan:  Status is: Inpatient  Remains inpatient appropriate  because:Ongoing diagnostic testing needed not appropriate for outpatient work up and Inpatient level of care appropriate due to severity of illness  Dispo: The patient is from: Home              Anticipated d/c is to: SNF              Patient currently is not medically stable to d/c.   Difficult to place patient No  Consultants:  General surgery Infectious diseases  Procedures:  None  Antimicrobials: Vancomycin 8/17 - 8/18 Cefepime, flagyl 8/17 Zosyn 8/17 - 8/20 Unasyn 8/20 >>  Subjective: Uneventful evening, pain in abdomen is nearly resolved. No nausea, vomiting, tolerating diet. No fever. No dyspnea or chest pain. SpO2 erroneously charted in 60's this morning, currently breathing normally without hypoxia.   Objective: Vitals:   10/18/20 0531 10/18/20 1259 10/18/20 2022 10/19/20 0549  BP:  132/85 (!) 148/55 (!) 146/45  Pulse:  75 71 61  Resp:  18  18  Temp:  98.3 F (36.8 C) 99.5 F (37.5 C) 99.2 F (37.3 C)  TempSrc:  Oral Oral Oral  SpO2:  94% 92% (!) 64%  Weight: 129 kg     Height:        Intake/Output Summary (Last 24 hours) at 10/19/2020 0824 Last data filed at 10/19/2020 0550 Gross per 24 hour  Intake 2137.26 ml  Output 1901 ml  Net 236.26 ml   Filed Weights   10/17/20 0500 10/18/20 0500 10/18/20 0531  Weight: 126.2 kg 129.4 kg 129 kg   Gen: 75 y.o. male in no distress Pulm: Nonlabored breathing room air. Clear. CV: Regular rate and rhythm. No murmur, rub, or gallop. No JVD, no dependent edema. GI: Abdomen soft, not significantly tender, protuberant but  non-distended, with normoactive bowel sounds.  Ext: Warm, no deformities Skin: No new rashes, lesions or ulcers on visualized skin. Neuro: Alert and oriented. No focal neurological deficits. Psych: Judgement and insight appear fair. Mood euthymic & affect congruent. Behavior is appropriate.    Data Reviewed: I have personally reviewed following labs and imaging studies  CBC: Recent Labs  Lab  10/15/20 2044 10/16/20 0451 10/17/20 0708 10/19/20 0554  WBC 20.3* 17.5* 15.1* 18.2*  NEUTROABS 18.6*  --   --  16.0*  HGB 12.0* 11.5* 11.3* 12.5*  HCT 36.7* 33.8* 34.1* 36.7*  MCV 100.8* 98.3 98.6 96.1  PLT 126* 112* 124* 123456*   Basic Metabolic Panel: Recent Labs  Lab 10/15/20 2044 10/16/20 0451 10/17/20 0708 10/18/20 0747  NA 133* 134* 133* 132*  K 3.7 3.6 3.5 3.4*  CL 100 100 101 97*  CO2 '22 26 25 24  '$ GLUCOSE 269* 250* 119* 156*  BUN 40* 42* 55* 59*  CREATININE 1.84* 1.74* 1.99* 2.08*  CALCIUM 8.1* 8.1* 7.8* 8.0*  MG  --  1.6*  --   --    GFR: Estimated Creatinine Clearance: 45.1 mL/min (A) (by C-G formula based on SCr of 2.08 mg/dL (H)). Liver Function Tests: Recent Labs  Lab 10/15/20 2044 10/16/20 0451 10/17/20 0708  AST 67* 67* 52*  ALT 66* 77* 76*  ALKPHOS 41 43 52  BILITOT 1.3* 1.0 0.8  PROT 6.3* 5.7* 5.8*  ALBUMIN 3.0* 2.4* 2.3*   No results for input(s): LIPASE, AMYLASE in the last 168 hours. No results for input(s): AMMONIA in the last 168 hours. Coagulation Profile: Recent Labs  Lab 10/15/20 2044  INR  1.4*   Cardiac Enzymes: No results for input(s): CKTOTAL, CKMB, CKMBINDEX, TROPONINI in the last 168 hours. BNP (last 3 results) No results for input(s): PROBNP in the last 8760 hours. HbA1C: No results for input(s): HGBA1C in the last 72 hours. CBG: Recent Labs  Lab 10/18/20 0722 10/18/20 1108 10/18/20 1607 10/18/20 2154 10/19/20 0812  GLUCAP 157* 162* 159* 142* 182*   Lipid Profile: No results for input(s): CHOL, HDL, LDLCALC, TRIG, CHOLHDL, LDLDIRECT in the last 72 hours. Thyroid Function Tests: No results for input(s): TSH, T4TOTAL, FREET4, T3FREE, THYROIDAB in the last 72 hours. Anemia Panel: No results for input(s): VITAMINB12, FOLATE, FERRITIN, TIBC, IRON, RETICCTPCT in the last 72 hours. Urine analysis:    Component Value Date/Time   COLORURINE AMBER (A) 10/16/2020 0036   APPEARANCEUR CLOUDY (A) 10/16/2020 0036   LABSPEC  1.041 (H) 10/16/2020 0036   LABSPEC 1.020 12/27/2018 1315   PHURINE 5.0 10/16/2020 0036   GLUCOSEU 50 (A) 10/16/2020 0036   HGBUR SMALL (A) 10/16/2020 0036   BILIRUBINUR NEGATIVE 10/16/2020 0036   BILIRUBINUR negative 12/27/2018 1315   BILIRUBINUR neg 06/23/2016 1050   KETONESUR 5 (A) 10/16/2020 0036   PROTEINUR 100 (A) 10/16/2020 0036   UROBILINOGEN negative (A) 06/23/2016 1050   NITRITE NEGATIVE 10/16/2020 0036   LEUKOCYTESUR NEGATIVE 10/16/2020 0036   Recent Results (from the past 240 hour(s))  Culture, blood (Routine x 2)     Status: Abnormal   Collection Time: 10/15/20  8:44 PM   Specimen: BLOOD  Result Value Ref Range Status   Specimen Description   Final    BLOOD BLOOD RIGHT HAND Performed at [facility], [address] [place], [place] [address]    Special Requests   Final    BOTTLES DRAWN AEROBIC AND ANAEROBIC Blood Culture adequate volume Performed at [facility], [address] [place], [place] [address]    Culture  Setup Time   Final    GRAM POSITIVE COCCI IN BOTH AEROBIC AND ANAEROBIC BOTTLES CRITICAL VALUE NOTED.  VALUE IS CONSISTENT WITH PREVIOUSLY REPORTED AND CALLED VALUE. Performed at [facility] TO, READ BACK BY AND VERIFIED WITH: B ROBERTSON RN 1856 10/16/20 A BROWNING    Culture (A)  Final    STREPTOCOCCUS GALLOLYTICUS SUSCEPTIBILITIES PERFORMED ON PREVIOUS CULTURE WITHIN THE LAST 5 DAYS. Performed at [facility], [place] [address], [place], [place] [address]    Report Status 10/18/2020 FINAL  Final  Culture, blood (Routine x 2)     Status: Abnormal   Collection Time: 10/15/20  8:44 PM   Specimen: BLOOD  Result Value Ref Range Status   Specimen Description   Final    BLOOD BLOOD LEFT HAND Performed at [facility], [address] [place], [place] [address]    Special Requests   Final    BOTTLES DRAWN AEROBIC AND ANAEROBIC Blood Culture adequate volume Performed at [facility],  [address], [place], [place] [address]    Culture  Setup Time   Final    GRAM POSITIVE COCCI IN BOTH AEROBIC AND ANAEROBIC BOTTLES Gram Stain Report Called to,Read Back By and Verified With: OAKLEY,B AT 0945 ON 8.18.22 BY RUCINSKI,B Performed at [facility] ID to follow CRITICAL RESULT CALLED TO, READ BACK BY AND VERIFIED WITHLetitia Neri RN 1856 10/16/20 A BROWNING Performed at [facility], [place] [address] [place], [place] [address]    Culture STREPTOCOCCUS GALLOLYTICUS (A)  Final  Report Status 10/18/2020 FINAL  Final   Organism ID, Bacteria STREPTOCOCCUS GALLOLYTICUS  Final      Susceptibility   Streptococcus gallolyticus - MIC*    PENICILLIN 0.12 SENSITIVE Sensitive     CEFTRIAXONE 0.25 SENSITIVE Sensitive     ERYTHROMYCIN <=0.12 SENSITIVE Sensitive     LEVOFLOXACIN 2 SENSITIVE Sensitive     VANCOMYCIN <=0.12 SENSITIVE Sensitive     * STREPTOCOCCUS GALLOLYTICUS  Blood Culture ID Panel (Reflexed)     Status: Abnormal   Collection Time: 10/15/20  8:44 PM  Result Value Ref Range Status   Enterococcus faecalis NOT DETECTED NOT DETECTED Final   Enterococcus Faecium NOT DETECTED NOT DETECTED Final   Listeria monocytogenes NOT DETECTED NOT DETECTED Final   Staphylococcus species NOT DETECTED NOT DETECTED Final   Staphylococcus aureus (BCID) NOT DETECTED NOT DETECTED Final   Staphylococcus epidermidis NOT DETECTED NOT DETECTED Final   Staphylococcus lugdunensis NOT DETECTED NOT DETECTED Final   Streptococcus species DETECTED (A) NOT DETECTED Final    Comment: Not Enterococcus species, Streptococcus agalactiae, Streptococcus pyogenes, or Streptococcus pneumoniae. CRITICAL RESULT CALLED TO, READ BACK BY AND VERIFIED WITH: B ROBERTSON RN 1856 10/16/20 A BROWNING    Streptococcus agalactiae NOT DETECTED NOT DETECTED Final   Streptococcus pneumoniae NOT DETECTED NOT DETECTED Final   Streptococcus pyogenes NOT DETECTED NOT DETECTED Final   A.calcoaceticus-baumannii  NOT DETECTED NOT DETECTED Final   Bacteroides fragilis NOT DETECTED NOT DETECTED Final   Enterobacterales NOT DETECTED NOT DETECTED Final   Enterobacter cloacae complex NOT DETECTED NOT DETECTED Final   Escherichia coli NOT DETECTED NOT DETECTED Final   Klebsiella aerogenes NOT DETECTED NOT DETECTED Final   Klebsiella oxytoca NOT DETECTED NOT DETECTED Final   Klebsiella pneumoniae NOT DETECTED NOT DETECTED Final   Proteus species NOT DETECTED NOT DETECTED Final   Salmonella species NOT DETECTED NOT DETECTED Final   Serratia marcescens NOT DETECTED NOT DETECTED Final   Haemophilus influenzae NOT DETECTED NOT DETECTED Final   Neisseria meningitidis NOT DETECTED NOT DETECTED Final   Pseudomonas aeruginosa NOT DETECTED NOT DETECTED Final   Stenotrophomonas maltophilia NOT DETECTED NOT DETECTED Final   Candida albicans NOT DETECTED NOT DETECTED Final   Candida auris NOT DETECTED NOT DETECTED Final   Candida glabrata NOT DETECTED NOT DETECTED Final   Candida krusei NOT DETECTED NOT DETECTED Final   Candida parapsilosis NOT DETECTED NOT DETECTED Final   Candida tropicalis NOT DETECTED NOT DETECTED Final   Cryptococcus neoformans/gattii NOT DETECTED NOT DETECTED Final    Comment: Performed at [facility], [address] [place], [place] [address]  Resp Panel by RT-PCR (Flu A&B, Covid) Nasopharyngeal Swab     Status: None   Collection Time: 10/15/20  8:45 PM   Specimen: Nasopharyngeal Swab; Nasopharyngeal(NP) swabs in vial transport medium  Result Value Ref Range Status   SARS Coronavirus 2 by RT PCR NEGATIVE NEGATIVE Final    Comment: (NOTE) SARS-CoV-2 target nucleic acids are NOT DETECTED.  The SARS-CoV-2 RNA is generally detectable in upper respiratory specimens during the acute phase of infection. The lowest concentration of SARS-CoV-2 viral copies this assay can detect is 138 copies/mL. A negative result does not preclude SARS-Cov-2 infection and should not be used as the sole  basis for treatment or other patient management decisions. A negative result may occur with  improper specimen collection/handling, submission of specimen other than nasopharyngeal swab, presence of viral mutation(s) within the areas targeted by this assay, and inadequate number of viral copies(<138  copies/mL). A negative result must be combined with clinical observations, patient history, and epidemiological information. The expected result is Negative.  Fact Sheet for Patients:  EntrepreneurPulse.com.au  Fact Sheet for Healthcare Providers:  IncredibleEmployment.be  This test is no t yet approved or cleared by the [place] FDA and  has been authorized for detection and/or diagnosis of SARS-CoV-2 by FDA under an Emergency Use Authorization (EUA). This EUA will remain  in effect (meaning this test can be used) for the duration of the COVID-19 declaration under Section 564(b)(1) of the Act, 21 U.S.C.section 360bbb-3(b)(1), unless the authorization is terminated  or revoked sooner.       Influenza A by PCR NEGATIVE NEGATIVE Final   Influenza B by PCR NEGATIVE NEGATIVE Final    Comment: (NOTE) The Xpert Xpress SARS-CoV-2/FLU/RSV plus assay is intended as an aid in the diagnosis of influenza from Nasopharyngeal swab specimens and should not be used as a sole basis for treatment. Nasal washings and aspirates are unacceptable for Xpert Xpress SARS-CoV-2/FLU/RSV testing.  Fact Sheet for Patients: EntrepreneurPulse.com.au  Fact Sheet for Healthcare Providers: IncredibleEmployment.be  This test is not yet approved or cleared by the [place] FDA and has been authorized for detection and/or diagnosis of SARS-CoV-2 by FDA under an Emergency Use Authorization (EUA). This EUA will remain in effect (meaning this test can be used) for the duration of the COVID-19 declaration under Section 564(b)(1) of the Act,  21 U.S.C. section 360bbb-3(b)(1), unless the authorization is terminated or revoked.  Performed at [facility], [address] [place], [place] [address]   Urine Culture     Status: Abnormal   Collection Time: 10/16/20 12:36 AM   Specimen: In/Out Cath Urine  Result Value Ref Range Status   Specimen Description   Final    IN/OUT CATH URINE Performed at [facility], [address] [place], [place] [address]    Special Requests   Final    NONE Performed at [facility], [address], [place], [place] [address]    Culture MULTIPLE SPECIES PRESENT, SUGGEST RECOLLECTION (A)  Final   Report Status 10/17/2020 FINAL  Final  Culture, blood (routine x 2)     Status: None (Preliminary result)   Collection Time: 10/17/20  7:09 AM   Specimen: BLOOD  Result Value Ref Range Status   Specimen Description BLOOD LEFT ANTECUBITAL  Final   Special Requests   Final    BOTTLES DRAWN AEROBIC AND ANAEROBIC Blood Culture adequate volume   Culture   Final    NO GROWTH 1 DAY Performed at [facility], [address], [place], [place] [address]    Report Status PENDING  Incomplete  Culture, blood (routine x 2)     Status: None (Preliminary result)   Collection Time: 10/17/20  7:10 AM   Specimen: BLOOD  Result Value Ref Range Status   Specimen Description BLOOD RIGHT ANTECUBITAL  Final   Special Requests   Final    BOTTLES DRAWN AEROBIC AND ANAEROBIC Blood Culture adequate volume   Culture   Final    NO GROWTH 1 DAY Performed at [facility], [address], [place], [place] [address]    Report Status PENDING  Incomplete      Radiology Studies: ECHOCARDIOGRAM COMPLETE  Result Date: 10/17/2020    ECHOCARDIOGRAM REPORT   Patient Name:   James Glover Date of Exam: 10/17/2020 Medical Rec #:  [id number]       Height:       75.0  in Accession #:    [id number]      Weight:       278.2 lb Date of Birth:  December 13, 1945       BSA:          2.525 m Patient Age:    55 years        BP:           135/73 mmHg  Patient Gender: M               HR:           66 bpm. Exam Location:  [facility] Procedure: 2D Echo, Cardiac Doppler and Color Doppler Indications:    Bacteremia  History:        Patient has prior history of Echocardiogram examinations, most                 recent 02/20/2010. Signs/Symptoms:Altered Mental Status and CKD,                 sepsis; Risk Factors:Diabetes, Hypertension, Dyslipidemia and                 Morbid obesity. Acute cholecystitis, pleural effusion.  Sonographer:    Dustin Flock RDCS Referring Phys: [place]  1. Left ventricular ejection fraction, by estimation, is 60 to 65%. The left ventricle has normal function. The left ventricle has no regional wall motion abnormalities. Left ventricular diastolic parameters were normal.  2. RV-RA gradient 38 mmHg suggests at least mildly elevated RVSP. Right ventricular systolic function is normal. The right ventricular size is normal.  3. There is a trivial pericardial effusion that is circumferential.  4. The mitral valve is grossly normal. Mild mitral valve regurgitation.  5. The aortic valve is tricuspid. There is mild calcification of the aortic valve. Aortic valve regurgitation is not visualized.  6. Unable to estimate CVP.  7. No obvious valvular vegetations. Comparison(s): No prior Echocardiogram. FINDINGS  Left Ventricle: Left ventricular ejection fraction, by estimation, is 60 to 65%. The left ventricle has normal function. The left ventricle has no regional wall motion abnormalities. The left ventricular internal cavity size was normal in size. There is  borderline left ventricular hypertrophy. Left ventricular diastolic parameters were normal. Right Ventricle: RV-RA gradient 38 mmHg suggests at least mildly elevated RVSP. The right ventricular size is normal. No increase in right ventricular wall thickness. Right ventricular systolic function is normal. Left Atrium: Left atrial size was normal in size. Right Atrium: Right  atrial size was normal in size. Pericardium: Trivial pericardial effusion is present. The pericardial effusion is circumferential. Mitral Valve: The mitral valve is grossly normal. Mild mitral annular calcification. Mild mitral valve regurgitation. Tricuspid Valve: The tricuspid valve is grossly normal. Tricuspid valve regurgitation is mild. Aortic Valve: The aortic valve is tricuspid. There is mild calcification of the aortic valve. There is mild aortic valve annular calcification. Aortic valve regurgitation is not visualized. Pulmonic Valve: The pulmonic valve was grossly normal. Pulmonic valve regurgitation is not visualized. Aorta: The aortic root is normal in size and structure. Venous: Unable to estimate CVP. The inferior vena cava was not well visualized. IAS/Shunts: No atrial level shunt detected by color flow Doppler.  LEFT VENTRICLE PLAX 2D LVIDd:         5.23 cm  Diastology LVIDs:         2.83 cm  LV e' medial:    6.92 cm/s LV PW:  1.06 cm  LV E/e' medial:  9.3 LV IVS:        1.04 cm  LV e' lateral:   8.32 cm/s LVOT diam:     2.20 cm  LV E/e' lateral: 7.7 LV SV:         101 LV SV Index:   40 LVOT Area:     3.80 cm  RIGHT VENTRICLE RV Basal diam:  3.28 cm RV S prime:     4.35 cm/s TAPSE (M-mode): 2.6 cm LEFT ATRIUM             Index       RIGHT ATRIUM           Index LA diam:        3.70 cm 1.47 cm/m  RA Area:     19.60 cm LA Vol (A2C):   63.1 ml 24.99 ml/m RA Volume:   61.90 ml  24.52 ml/m LA Vol (A4C):   54.2 ml 21.47 ml/m LA Biplane Vol: 59.7 ml 23.64 ml/m  AORTIC VALVE LVOT Vmax:   115.00 cm/s LVOT Vmean:  77.200 cm/s LVOT VTI:    0.265 m  AORTA Ao Root diam: 3.10 cm MITRAL VALVE               TRICUSPID VALVE MV Area (PHT): 3.99 cm    TR Peak grad:   38.2 mmHg MV Decel Time: 190 msec    TR Vmax:        309.00 cm/s MV E velocity: 64.30 cm/s MV A velocity: 57.00 cm/s  SHUNTS MV E/A ratio:  1.13        Systemic VTI:  0.26 m                            Systemic Diam: 2.20 cm Rozann Lesches  MD Electronically signed by Rozann Lesches MD Signature Date/Time: 10/17/2020/11:15:33 AM    Final     Scheduled Meds:  aspirin  81 mg Oral Daily   docusate sodium  100 mg Oral BID   famotidine  40 mg Oral QHS   heparin  5,000 Units Subcutaneous Q8H   insulin aspart  0-15 Units Subcutaneous TID WC   insulin aspart  0-5 Units Subcutaneous QHS   insulin detemir  10 Units Subcutaneous QHS   losartan  50 mg Oral Daily   oxybutynin  5 mg Oral BID   pantoprazole  40 mg Oral Daily   simvastatin  20 mg Oral q1800   tamsulosin  0.4 mg Oral BID PC   Continuous Infusions:  ampicillin-sulbactam (UNASYN) IV 3 g (10/19/20 0742)   lactated ringers 50 mL/hr at 10/18/20 1842     LOS: 3 days   Time spent: 25 minutes.  Patrecia Pour, MD [facility] [email or web address] 10/19/2020, 8:24 AM

## 2020-10-19 NOTE — Progress Notes (Signed)
Subjective: Patient has no abdominal complaints.  Eating regular diet.  Objective: Vital signs in last 24 hours: Temp:  [98.3 F (36.8 C)-99.5 F (37.5 C)] 99.2 F (37.3 C) (08/21 0549) Pulse Rate:  [61-75] 61 (08/21 0549) Resp:  [18] 18 (08/21 0549) BP: (132-148)/(45-85) 146/45 (08/21 0549) SpO2:  [64 %-94 %] 64 % (08/21 0549) Last BM Date: 10/15/20  Intake/Output from previous day: 08/20 0701 - 08/21 0700 In: 2137.3 [P.O.:2040; IV Piggyback:97.3] Out: 1901 [Urine:1900; Stool:1] Intake/Output this shift: No intake/output data recorded.  General appearance: alert, cooperative, and no distress GI: Soft, nontender, nondistended.  No rigidity noted.  Lab Results:  Recent Labs    10/17/20 0708 10/19/20 0554  WBC 15.1* 18.2*  HGB 11.3* 12.5*  HCT 34.1* 36.7*  PLT 124* 148*   BMET Recent Labs    10/18/20 0747 10/19/20 0554  NA 132* 130*  K 3.4* 3.3*  CL 97* 98  CO2 24 23  GLUCOSE 156* 154*  BUN 59* 52*  CREATININE 2.08* 1.84*  CALCIUM 8.0* 7.7*   PT/INR No results for input(s): LABPROT, INR in the last 72 hours.  Studies/Results: ECHOCARDIOGRAM COMPLETE  Result Date: 10/17/2020    ECHOCARDIOGRAM REPORT   Patient Name:   James Glover Date of Exam: 10/17/2020 Medical Rec #:  [id number]       Height:       75.0 in Accession #:    [id number]      Weight:       278.2 lb Date of Birth:  May 31, 1945       BSA:          2.525 m Patient Age:    75 years        BP:           135/73 mmHg Patient Gender: M               HR:           66 bpm. Exam Location:  [facility] Procedure: 2D Echo, Cardiac Doppler and Color Doppler Indications:    Bacteremia  History:        Patient has prior history of Echocardiogram examinations, most                 recent 02/20/2010. Signs/Symptoms:Altered Mental Status and CKD,                 sepsis; Risk Factors:Diabetes, Hypertension, Dyslipidemia and                 Morbid obesity. Acute cholecystitis, pleural effusion.  Sonographer:    Dustin Flock RDCS Referring Phys: [place]  1. Left ventricular ejection fraction, by estimation, is 60 to 65%. The left ventricle has normal function. The left ventricle has no regional wall motion abnormalities. Left ventricular diastolic parameters were normal.  2. RV-RA gradient 38 mmHg suggests at least mildly elevated RVSP. Right ventricular systolic function is normal. The right ventricular size is normal.  3. There is a trivial pericardial effusion that is circumferential.  4. The mitral valve is grossly normal. Mild mitral valve regurgitation.  5. The aortic valve is tricuspid. There is mild calcification of the aortic valve. Aortic valve regurgitation is not visualized.  6. Unable to estimate CVP.  7. No obvious valvular vegetations. Comparison(s): No prior Echocardiogram. FINDINGS  Left Ventricle: Left ventricular ejection fraction, by estimation, is 60 to 65%. The left ventricle has normal function. The left ventricle has no regional  wall motion abnormalities. The left ventricular internal cavity size was normal in size. There is  borderline left ventricular hypertrophy. Left ventricular diastolic parameters were normal. Right Ventricle: RV-RA gradient 38 mmHg suggests at least mildly elevated RVSP. The right ventricular size is normal. No increase in right ventricular wall thickness. Right ventricular systolic function is normal. Left Atrium: Left atrial size was normal in size. Right Atrium: Right atrial size was normal in size. Pericardium: Trivial pericardial effusion is present. The pericardial effusion is circumferential. Mitral Valve: The mitral valve is grossly normal. Mild mitral annular calcification. Mild mitral valve regurgitation. Tricuspid Valve: The tricuspid valve is grossly normal. Tricuspid valve regurgitation is mild. Aortic Valve: The aortic valve is tricuspid. There is mild calcification of the aortic valve. There is mild aortic valve annular calcification. Aortic  valve regurgitation is not visualized. Pulmonic Valve: The pulmonic valve was grossly normal. Pulmonic valve regurgitation is not visualized. Aorta: The aortic root is normal in size and structure. Venous: Unable to estimate CVP. The inferior vena cava was not well visualized. IAS/Shunts: No atrial level shunt detected by color flow Doppler.  LEFT VENTRICLE PLAX 2D LVIDd:         5.23 cm  Diastology LVIDs:         2.83 cm  LV e' medial:    6.92 cm/s LV PW:         1.06 cm  LV E/e' medial:  9.3 LV IVS:        1.04 cm  LV e' lateral:   8.32 cm/s LVOT diam:     2.20 cm  LV E/e' lateral: 7.7 LV SV:         101 LV SV Index:   40 LVOT Area:     3.80 cm  RIGHT VENTRICLE RV Basal diam:  3.28 cm RV S prime:     4.35 cm/s TAPSE (M-mode): 2.6 cm LEFT ATRIUM             Index       RIGHT ATRIUM           Index LA diam:        3.70 cm 1.47 cm/m  RA Area:     19.60 cm LA Vol (A2C):   63.1 ml 24.99 ml/m RA Volume:   61.90 ml  24.52 ml/m LA Vol (A4C):   54.2 ml 21.47 ml/m LA Biplane Vol: 59.7 ml 23.64 ml/m  AORTIC VALVE LVOT Vmax:   115.00 cm/s LVOT Vmean:  77.200 cm/s LVOT VTI:    0.265 m  AORTA Ao Root diam: 3.10 cm MITRAL VALVE               TRICUSPID VALVE MV Area (PHT): 3.99 cm    TR Peak grad:   38.2 mmHg MV Decel Time: 190 msec    TR Vmax:        309.00 cm/s MV E velocity: 64.30 cm/s MV A velocity: 57.00 cm/s  SHUNTS MV E/A ratio:  1.13        Systemic VTI:  0.26 m                            Systemic Diam: 2.20 cm Rozann Lesches MD Electronically signed by Rozann Lesches MD Signature Date/Time: 10/17/2020/11:15:33 AM    Final     Anti-infectives: Anti-infectives (From admission, onward)    Start     Dose/Rate Route Frequency Ordered Stop   10/18/20 1800  Ampicillin-Sulbactam (UNASYN) 3 g in sodium chloride 0.9 % 100 mL IVPB        3 g 200 mL/hr over 30 Minutes Intravenous Every 6 hours 10/18/20 1647     10/16/20 2200  vancomycin (VANCOREADY) IVPB 1250 mg/250 mL  Status:  Discontinued        1,250 mg 166.7  mL/hr over 90 Minutes Intravenous Every 24 hours 10/16/20 0012 10/16/20 0127   10/16/20 1645  vancomycin (VANCOREADY) IVPB 1250 mg/250 mL  Status:  Discontinued        1,250 mg 166.7 mL/hr over 90 Minutes Intravenous Every 24 hours 10/16/20 1555 10/17/20 0935   10/16/20 1000  ceFEPIme (MAXIPIME) 2 g in sodium chloride 0.9 % 100 mL IVPB  Status:  Discontinued        2 g 200 mL/hr over 30 Minutes Intravenous Every 12 hours 10/16/20 0012 10/16/20 0127   10/16/20 0600  piperacillin-tazobactam (ZOSYN) IVPB 3.375 g  Status:  Discontinued        3.375 g 12.5 mL/hr over 240 Minutes Intravenous Every 8 hours 10/16/20 0127 10/18/20 1540   10/15/20 2215  vancomycin (VANCOREADY) IVPB 2000 mg/400 mL        2,000 mg 200 mL/hr over 120 Minutes Intravenous  Once 10/15/20 2214 10/16/20 0133   10/15/20 2145  ceFEPIme (MAXIPIME) 2 g in sodium chloride 0.9 % 100 mL IVPB        2 g 200 mL/hr over 30 Minutes Intravenous  Once 10/15/20 2136 10/15/20 2301   10/15/20 2145  metroNIDAZOLE (FLAGYL) IVPB 500 mg        500 mg 100 mL/hr over 60 Minutes Intravenous  Once 10/15/20 2136 10/15/20 2301   10/15/20 2145  vancomycin (VANCOCIN) IVPB 1000 mg/200 mL premix  Status:  Discontinued        1,000 mg 200 mL/hr over 60 Minutes Intravenous  Once 10/15/20 2136 10/15/20 2213       Assessment/Plan: Impression: Acalculous cholecystitis with liver abscess and bacteremia.  No need for acute surgical invention at this time.  He did have a slight elevation of his leukocytosis to 18,000.  Antibiotic therapy per ID.  We will follow peripherally with you.  LOS: 3 days    Aviva Signs 10/19/2020

## 2020-10-20 ENCOUNTER — Inpatient Hospital Stay
Admission: RE | Admit: 2020-10-20 | Discharge: 2020-10-23 | Payer: Medicare PPO | Source: Ambulatory Visit | Attending: Internal Medicine | Admitting: Internal Medicine

## 2020-10-20 DIAGNOSIS — K819 Cholecystitis, unspecified: Secondary | ICD-10-CM | POA: Diagnosis present

## 2020-10-20 DIAGNOSIS — N1832 Chronic kidney disease, stage 3b: Secondary | ICD-10-CM | POA: Diagnosis present

## 2020-10-20 DIAGNOSIS — E1169 Type 2 diabetes mellitus with other specified complication: Secondary | ICD-10-CM | POA: Diagnosis not present

## 2020-10-20 DIAGNOSIS — E1122 Type 2 diabetes mellitus with diabetic chronic kidney disease: Secondary | ICD-10-CM | POA: Diagnosis present

## 2020-10-20 DIAGNOSIS — N170 Acute kidney failure with tubular necrosis: Secondary | ICD-10-CM | POA: Diagnosis present

## 2020-10-20 DIAGNOSIS — K812 Acute cholecystitis with chronic cholecystitis: Secondary | ICD-10-CM | POA: Diagnosis not present

## 2020-10-20 DIAGNOSIS — N400 Enlarged prostate without lower urinary tract symptoms: Secondary | ICD-10-CM | POA: Diagnosis present

## 2020-10-20 DIAGNOSIS — I700 Atherosclerosis of aorta: Secondary | ICD-10-CM | POA: Diagnosis not present

## 2020-10-20 DIAGNOSIS — J90 Pleural effusion, not elsewhere classified: Secondary | ICD-10-CM | POA: Diagnosis present

## 2020-10-20 DIAGNOSIS — N3289 Other specified disorders of bladder: Secondary | ICD-10-CM | POA: Diagnosis not present

## 2020-10-20 DIAGNOSIS — Z0389 Encounter for observation for other suspected diseases and conditions ruled out: Secondary | ICD-10-CM | POA: Diagnosis not present

## 2020-10-20 DIAGNOSIS — K219 Gastro-esophageal reflux disease without esophagitis: Secondary | ICD-10-CM | POA: Diagnosis present

## 2020-10-20 DIAGNOSIS — K750 Abscess of liver: Secondary | ICD-10-CM | POA: Diagnosis present

## 2020-10-20 DIAGNOSIS — Z794 Long term (current) use of insulin: Secondary | ICD-10-CM | POA: Diagnosis not present

## 2020-10-20 DIAGNOSIS — I129 Hypertensive chronic kidney disease with stage 1 through stage 4 chronic kidney disease, or unspecified chronic kidney disease: Secondary | ICD-10-CM | POA: Diagnosis present

## 2020-10-20 DIAGNOSIS — N1831 Chronic kidney disease, stage 3a: Secondary | ICD-10-CM | POA: Diagnosis not present

## 2020-10-20 DIAGNOSIS — R1011 Right upper quadrant pain: Principal | ICD-10-CM

## 2020-10-20 DIAGNOSIS — Z6835 Body mass index (BMI) 35.0-35.9, adult: Secondary | ICD-10-CM | POA: Diagnosis not present

## 2020-10-20 DIAGNOSIS — K828 Other specified diseases of gallbladder: Secondary | ICD-10-CM | POA: Diagnosis not present

## 2020-10-20 DIAGNOSIS — E669 Obesity, unspecified: Secondary | ICD-10-CM | POA: Diagnosis present

## 2020-10-20 DIAGNOSIS — E871 Hypo-osmolality and hyponatremia: Secondary | ICD-10-CM | POA: Diagnosis present

## 2020-10-20 DIAGNOSIS — K769 Liver disease, unspecified: Secondary | ICD-10-CM | POA: Diagnosis not present

## 2020-10-20 DIAGNOSIS — I10 Essential (primary) hypertension: Secondary | ICD-10-CM | POA: Diagnosis not present

## 2020-10-20 DIAGNOSIS — D509 Iron deficiency anemia, unspecified: Secondary | ICD-10-CM | POA: Diagnosis not present

## 2020-10-20 DIAGNOSIS — K82A1 Gangrene of gallbladder in cholecystitis: Secondary | ICD-10-CM | POA: Diagnosis present

## 2020-10-20 DIAGNOSIS — E1121 Type 2 diabetes mellitus with diabetic nephropathy: Secondary | ICD-10-CM | POA: Diagnosis not present

## 2020-10-20 DIAGNOSIS — E785 Hyperlipidemia, unspecified: Secondary | ICD-10-CM | POA: Diagnosis present

## 2020-10-20 DIAGNOSIS — Z8249 Family history of ischemic heart disease and other diseases of the circulatory system: Secondary | ICD-10-CM | POA: Diagnosis not present

## 2020-10-20 DIAGNOSIS — D631 Anemia in chronic kidney disease: Secondary | ICD-10-CM | POA: Diagnosis present

## 2020-10-20 DIAGNOSIS — Z20822 Contact with and (suspected) exposure to covid-19: Secondary | ICD-10-CM | POA: Diagnosis present

## 2020-10-20 DIAGNOSIS — E1165 Type 2 diabetes mellitus with hyperglycemia: Secondary | ICD-10-CM | POA: Diagnosis present

## 2020-10-20 DIAGNOSIS — A419 Sepsis, unspecified organism: Secondary | ICD-10-CM | POA: Diagnosis present

## 2020-10-20 DIAGNOSIS — K810 Acute cholecystitis: Secondary | ICD-10-CM | POA: Diagnosis present

## 2020-10-20 DIAGNOSIS — N183 Chronic kidney disease, stage 3 unspecified: Secondary | ICD-10-CM | POA: Diagnosis not present

## 2020-10-20 DIAGNOSIS — C61 Malignant neoplasm of prostate: Secondary | ICD-10-CM | POA: Diagnosis present

## 2020-10-20 DIAGNOSIS — R6520 Severe sepsis without septic shock: Secondary | ICD-10-CM | POA: Diagnosis present

## 2020-10-20 DIAGNOSIS — E1159 Type 2 diabetes mellitus with other circulatory complications: Secondary | ICD-10-CM | POA: Diagnosis not present

## 2020-10-20 DIAGNOSIS — D696 Thrombocytopenia, unspecified: Secondary | ICD-10-CM | POA: Diagnosis present

## 2020-10-20 DIAGNOSIS — Z833 Family history of diabetes mellitus: Secondary | ICD-10-CM | POA: Diagnosis not present

## 2020-10-20 DIAGNOSIS — L89322 Pressure ulcer of left buttock, stage 2: Secondary | ICD-10-CM | POA: Diagnosis present

## 2020-10-20 LAB — CBC WITH DIFFERENTIAL/PLATELET
Abs Immature Granulocytes: 0.66 10*3/uL — ABNORMAL HIGH (ref 0.00–0.07)
Basophils Absolute: 0.1 10*3/uL (ref 0.0–0.1)
Basophils Relative: 1 %
Eosinophils Absolute: 0.3 10*3/uL (ref 0.0–0.5)
Eosinophils Relative: 2 %
HCT: 31.9 % — ABNORMAL LOW (ref 39.0–52.0)
Hemoglobin: 10.8 g/dL — ABNORMAL LOW (ref 13.0–17.0)
Immature Granulocytes: 4 %
Lymphocytes Relative: 3 %
Lymphs Abs: 0.5 10*3/uL — ABNORMAL LOW (ref 0.7–4.0)
MCH: 32.7 pg (ref 26.0–34.0)
MCHC: 33.9 g/dL (ref 30.0–36.0)
MCV: 96.7 fL (ref 80.0–100.0)
Monocytes Absolute: 1.3 10*3/uL — ABNORMAL HIGH (ref 0.1–1.0)
Monocytes Relative: 8 %
Neutro Abs: 14.6 10*3/uL — ABNORMAL HIGH (ref 1.7–7.7)
Neutrophils Relative %: 82 %
Platelets: 160 10*3/uL (ref 150–400)
RBC: 3.3 MIL/uL — ABNORMAL LOW (ref 4.22–5.81)
RDW: 13.4 % (ref 11.5–15.5)
WBC: 17.4 10*3/uL — ABNORMAL HIGH (ref 4.0–10.5)
nRBC: 0 % (ref 0.0–0.2)

## 2020-10-20 LAB — BASIC METABOLIC PANEL
Anion gap: 7 (ref 5–15)
BUN: 45 mg/dL — ABNORMAL HIGH (ref 8–23)
CO2: 25 mmol/L (ref 22–32)
Calcium: 7.6 mg/dL — ABNORMAL LOW (ref 8.9–10.3)
Chloride: 100 mmol/L (ref 98–111)
Creatinine, Ser: 1.71 mg/dL — ABNORMAL HIGH (ref 0.61–1.24)
GFR, Estimated: 41 mL/min — ABNORMAL LOW (ref >60)
Glucose, Bld: 180 mg/dL — ABNORMAL HIGH (ref 70–99)
Potassium: 3.7 mmol/L (ref 3.5–5.1)
Sodium: 132 mmol/L — ABNORMAL LOW (ref 135–145)

## 2020-10-20 LAB — GLUCOSE, CAPILLARY
Glucose-Capillary: 176 mg/dL — ABNORMAL HIGH (ref 70–99)
Glucose-Capillary: 219 mg/dL — ABNORMAL HIGH (ref 70–99)

## 2020-10-20 MED ORDER — AMOXICILLIN-POT CLAVULANATE 875-125 MG PO TABS
1.0000 | ORAL_TABLET | Freq: Two times a day (BID) | ORAL | Status: DC
  Administered 2020-10-20: 1 via ORAL
  Filled 2020-10-20 (×3): qty 1

## 2020-10-20 MED ORDER — SACCHAROMYCES BOULARDII 250 MG PO CAPS
250.0000 mg | ORAL_CAPSULE | Freq: Two times a day (BID) | ORAL | Status: DC
  Administered 2020-10-20: 250 mg via ORAL
  Filled 2020-10-20: qty 1

## 2020-10-20 MED ORDER — SACCHAROMYCES BOULARDII 250 MG PO CAPS: 250.0000 mg | ORAL_CAPSULE | Freq: Two times a day (BID) | ORAL | 0 refills | Status: DC

## 2020-10-20 MED ORDER — AMOXICILLIN-POT CLAVULANATE 875-125 MG PO TABS: 1.0000 | ORAL_TABLET | Freq: Two times a day (BID) | ORAL | 0 refills | Status: DC

## 2020-10-20 MED ORDER — POTASSIUM CHLORIDE CRYS ER 20 MEQ PO TBCR
40.0000 meq | EXTENDED_RELEASE_TABLET | Freq: Once | ORAL | Status: AC
  Administered 2020-10-20: 40 meq via ORAL
  Filled 2020-10-20: qty 2

## 2020-10-20 NOTE — Evaluation (Signed)
Occupational Therapy Evaluation Patient Details Name: LAURENCE STELMACH MRN: [id number] DOB: [birth year] Today's Date: 10/20/2020    History of Present Illness Keevan Kimberling  is a 75 y.o. male, with history of CKD, hyperlipidemia, GERD, prostate cancer status posttreatment-in remission, type 2 diabetes mellitus, and more presents the ED with a chief complaint of abdominal pain.  Patient has intermittent confusion during my exam, which limits history.  At first he is only oriented to self.  After phlebotomy has a couple attempts at sticking him for blood he is more alert, but still confused.  Wife helps with history as she can.  Patient reports that he has had belly pain that been going on for some time.  I think it started 4 days ago.  At first 4 days ago wife noticed he was extra fatigue.  Then 3 days ago he felt better, [place] were at the beach and he helped drive home.  Then 2 days ago he felt much worse.  They went to see an outpatient physician who gave him pain pills and sent him home.  That physician set up a right upper quadrant ultrasound for this coming Friday, and a HIDA scan for a week from today.  Patient then threw up yesterday and wife reports he had a "rough night."  She describes this as he was unable to sleep, complaining of abdominal pain, and just generally not feeling well.  On the day of presentation he was somnolent all day.  He had a weak cough.  He would not take pain medications, or get up out of bed.  Wife denies had any fever.  She reports she is not concerned about his cough as he does have a chronic cough from GERD.  He has not had any diarrhea.  Patient reports no other complaints.   Clinical Impression   Pt agreeable to OT evaluation. Pt reports abdomen pain this date during mobility. Pt requires assist for supine to sit and sit to supine mobility. Pt initially had difficulty with sit to stand from EOB. EOB raised with pt then requiring only Min A for sit to stand with RW. Pt  requested to ambulate to bathroom and did so with Min A using RW. Pt was able to complete peri-care with SPV assist with lateral leans and sit to stand from the toilet. Pt appeared fatigued during transfer from toilet to bed. Pt is being discharged today per document review. Pt will be discharged to care of nursing staff for the remainder of the hospital stay.     Follow Up Recommendations  SNF    Equipment Recommendations  None recommended by OT           Precautions / Restrictions Precautions Precautions: Fall Restrictions Weight Bearing Restrictions: No      Mobility Bed Mobility Overal bed mobility: Needs Assistance Bed Mobility: Supine to Sit     Supine to sit: Min assist;Mod assist Sit to supine: Min assist   General bed mobility comments: assist to pull to seated EOB and for LE movement back into bed    Transfers Overall transfer level: Needs assistance Equipment used: Rolling walker (2 wheeled) Transfers: Sit to/from [facility] Sit to Stand: Min assist Stand pivot transfers: Min assist       General transfer comment: cueing for hand placement, cuing for safety    Balance Overall balance assessment: Needs assistance Sitting-balance support: No upper extremity supported Sitting balance-[name] Scale: Good Sitting balance - Comments: seated EOB   Standing  balance support: Bilateral upper extremity supported;During functional activity Standing balance-[name] Scale: Fair Standing balance comment: using RW                           ADL either performed or assessed with clinical judgement   ADL Overall ADL's : Needs assistance/impaired                     Lower Body Dressing: Total assistance;Bed level Lower Body Dressing Details (indicate cue type and reason): Assisted to don socks at bed level due to reports of pain in abdomen area if pt attempted on his own. Toilet Transfer: Min guard;Minimal  assistance;RW;Ambulation Armed forces technical officer Details (indicate cue type and reason): EOB to toilet and back with RW Toileting- Clothing Manipulation and Hygiene: Modified independent;Sit to/from stand;Sitting/lateral lean Toileting - Clothing Manipulation Details (indicate cue type and reason): SPV assist for peri-care from toilet             Vision Baseline Vision/History: 0 No visual deficits Ability to See in Adequate Light: 0 Adequate Patient Visual Report: No change from baseline Vision Assessment?: No apparent visual deficits     Perception     Praxis      Pertinent Vitals/Pain Pain Assessment: Faces Faces Pain Scale: Hurts even more Pain Location: abdomen Pain Descriptors / Indicators: Grimacing;Moaning Pain Intervention(s): Limited activity within patient's tolerance;Monitored during session;Repositioned     Hand Dominance Right   Extremity/Trunk Assessment Upper Extremity Assessment Upper Extremity Assessment: Generalized weakness   Lower Extremity Assessment Lower Extremity Assessment: Defer to PT evaluation   Cervical / Trunk Assessment Cervical / Trunk Assessment: Normal   Communication Communication Communication: No difficulties   Cognition Arousal/Alertness: Awake/alert Behavior During Therapy: WFL for tasks assessed/performed Overall Cognitive Status: Within Functional Limits for tasks assessed                                                      Home Living Family/patient expects to be discharged to:: Private residence Living Arrangements: Spouse/significant other Available Help at Discharge: Family;Available 24 hours/day Type of Home: House Home Access: Stairs to enter [employer] of Steps: 5-6 Entrance Stairs-Rails: Right;Left Home Layout: Two level;Bed/bath upstairs;Able to live on main level with bedroom/bathroom Alternate Level Stairs-Number of Steps: 13   Bathroom Shower/Tub: Engineer, site: Handicapped height Bathroom Accessibility: Yes How Accessible: Accessible via walker Home Equipment: None          Prior Functioning/Environment Level of Independence: Independent        Comments: Patient states community ambulation without AD, independent with ADL        OT Problem List:        OT Treatment/Interventions:      OT Goals(Current goals can be found in the care plan section) Acute Rehab OT Goals Patient Stated Goal: return home  OT Frequency:      End of Session Equipment Utilized During Treatment: Rolling walker  Activity Tolerance: Patient tolerated treatment well Patient left: in bed;with call bell/phone within reach;with bed alarm set  OT Visit Diagnosis: Unsteadiness on feet (R26.81);Muscle weakness (generalized) (M62.81);Pain Pain - part of body:  (abdomen)                Time: [id number] OT Time Calculation (min): 37 min  Charges:  OT General Charges $OT Visit: 1 Visit OT Evaluation $OT Eval Low Complexity: [place] OT, MOT  Larey Seat 10/20/2020, 9:07 AM

## 2020-10-20 NOTE — Care Management Important Message (Signed)
Important Message  Patient Details  Name: James Glover MRN: [id number] Date of Birth: [birth year]   Medicare Important Message Given:  Yes     Tommy Medal 10/20/2020, 1:40 PM

## 2020-10-20 NOTE — Discharge Summary (Addendum)
Physician Discharge Summary  James Glover [id number] DOB: 04/03/1945 DOA: 10/15/2020  PCP: [name], MD  Admit date: 10/15/2020 Discharge date: 10/20/2020  Admitted From: Home Disposition: SNF   Recommendations for Outpatient Follow-up:  Follow up with PCP in 1-2 weeks. Recommend recheck CMP and CBC in the next 1-2 weeks. Continue augmentin through 9/16, follow up with ID as scheduled Follow up with general surgery 9/13. Will need GI follow up for colonoscopy due to S. gallolyticus/S. bovis bacteremia's associated with colon malignancy.  Home Health: N/A Equipment/Devices: None new/per SNF Discharge Condition: Stable CODE STATUS: Full Diet recommendation: Heart healthy, carb-modified  Brief/Interim Summary: James Glover  is a 75 y.o. male, with history of stage III CKD, prostate CA in remission, T2DM, HTN, HLD and GERD who presented to the ED with 4 days of abdominal pain, fatigue/somnolence leadng him to lay in bed all day, and intermittent confusion. In the ED temperature 100.83F with leukocytosis (20k) and elevated lactic acid, thrombocytopenia (126k), SCr near baseline at 1.4, hyperglycemic without anion gap. CT chest and abdomen shows small bilateral pleural effusions, atherosclerosis, gallbladder distention and fat stranding in the right upper quadrant with perihepatic ascites, concerning for acute cholecystitis. Broad spectrum antibiotics were started for sepsis. Blood cultures drawn at admission have grown Streptococcus gallolyticus for which IV antibiotics were given with improvement in symptoms and leukocytosis. We have converted to oral augmentin per infectious disease specialist since repeat blood cultures from 8/19 have remained without growth. Surgery has been following, currently recommending medical therapy at this time. They will follow up with the patient to discuss interval cholecystectomy. The patient's acute illness has caused weakness requiring rehabilitation at  SNF which is pursued.   Discharge Diagnoses:  Active Problems:   Essential hypertension, benign   Dyslipidemia   Controlled type 2 diabetes mellitus with stage 3 chronic kidney disease, with long-term current use of insulin (HCC)   Acute cholecystitis   GERD (gastroesophageal reflux disease)   Liver abscess  Sepsis with lactic acidosis due to acute acalculous cholecystitis complicated by subcapsular liver abscess (4x1.7cm) and S. gallolyticus bacteremia: Lactic acid cleared. - Switched zosyn > unasyn > augmentin which will continue at discharge for 4 weeks (until 9/16), with plans to follow up at [facility] 8/31 at 2pm to determine whether prolonged course is required.  - Surgery following, recommending delayed operative management at this time. Size of fluid collection likely to be treatable by antibiotics alone.  - Leukocytosis is slowly improving.   - Repeat blood cultures drawn 8/19 and remain NGTD x3 days last checked on day of discharge.  - Colonoscopy recommended due to S. bovis/gallolyticus bacteremia. Not urgent.   Acute metabolic encephalopathy: Improved with treatment, suggesting sepsis as primary cause.  - Continue to minimize sedating medications - Continue delirium precautions.    LFT elevation: In setting of hepatic fluid collection. Improving mild elevation, Bilirubin normalized.    T2DM: HbA1c 7% in June 2022, at goal.  - Continue home insulin, saxagliptin, pioglitazone.    Stage IIIa CKD: Followed by Dr. Marval Regal.   - Stabilized through hospitalization.   Anemia of CKD: Normocytic in absence of bleeding.  - Continue monitoring.   Hypokalemia:  - Supplemented again on day of discharge. Recommend continued monitoring.   Thrombocytopenia: Resolved, likely due to sepsis.   HTN:  - Continue losartan.    HLD:  - LFTs not severely elevated, continuing to improve, so continuing statin.    GERD:  - Continue acid suppression.    Prostate  CA s/p radioactive seed  implant 2020: Followed by Dr. Tresa Moore.  - No changes to home oxybutynin and flomax   Weakness: Due to acute severe infection.  - PT, OT recommending rehabilitation. This is being pursued.    Obesity: Estimated body mass index is 35 kg/m   Discharge Instructions  Allergies as of 10/20/2020       Reactions   Ace Inhibitors Cough        Medication List     STOP taking these medications    calcipotriene 0.005 % ointment Commonly known as: DOVONOX   clotrimazole 1 % cream Commonly known as: LOTRIMIN   sildenafil 20 MG tablet Commonly known as: REVATIO   traMADol 50 MG tablet Commonly known as: ULTRAM       TAKE these medications    Accu-Chek Guide test strip Generic drug: glucose blood USE AS DIRECTED   Accu-Chek Softclix Lancets lancets 1 each by Other route 2 (two) times daily. Use as instructed   amoxicillin-clavulanate 875-125 MG tablet Commonly known as: AUGMENTIN Take 1 tablet by mouth every 12 (twelve) hours for 25 days.   aspirin 81 MG tablet Take 81 mg by mouth daily.   BD Pen Needle Nano U/F 32G X 4 MM Misc Generic drug: Insulin Pen Needle 1 each by Does not apply route as needed.   famotidine 40 MG tablet Commonly known as: PEPCID Take 40 mg by mouth at bedtime.   Fish Oil 1000 MG Caps Take 1 capsule by mouth 2 (two) times daily.   fluorouracil 5 % cream Commonly known as: EFUDEX   hydrochlorothiazide 25 MG tablet Commonly known as: HYDRODIURIL Take 1 tablet (25 mg total) by mouth daily. TAKE 1 TABLET(25 MG) BY MOUTH DAILY   losartan 50 MG tablet Commonly known as: COZAAR TAKE 1 TABLET BY MOUTH DAILY   multivitamin with minerals tablet Take 1 tablet by mouth daily.   omeprazole 40 MG capsule Commonly known as: PRILOSEC TAKE 1 CAPSULE(40 MG) BY MOUTH IN THE MORNING AND AT BEDTIME   Onglyza 2.5 MG Tabs tablet Generic drug: saxagliptin HCl TAKE 1 TABLET(2.5 MG) BY MOUTH DAILY   oxybutynin 5 MG tablet Commonly known as:  DITROPAN Take 5 mg by mouth 2 (two) times daily.   pioglitazone 45 MG tablet Commonly known as: ACTOS Take 1 tablet (45 mg total) by mouth daily.   saccharomyces boulardii 250 MG capsule Commonly known as: FLORASTOR Take 1 capsule (250 mg total) by mouth 2 (two) times daily for 25 days.   simvastatin 20 MG tablet Commonly known as: ZOCOR TAKE 1 TABLET(20 MG) BY MOUTH AT BEDTIME   tamsulosin 0.4 MG Caps capsule Commonly known as: FLOMAX Take 1 capsule (0.4 mg total) by mouth 2 (two) times daily after a meal. For urinary urgency or weak stream.   Tyler Aas FlexTouch 100 UNIT/ML FlexTouch Pen Generic drug: insulin degludec ADMINISTER 18 UNITS UNDER THE SKIN AT BEDTIME   valACYclovir 1000 MG tablet Commonly known as: VALTREX Take 2,000 mg by mouth as needed (fever blister). Reported on 04/09/2015        Contact information for follow-up providers     Virl Cagey, MD Follow up on 11/11/2020.   Specialty: General Surgery Why: follow up about gallbladder Contact information: [address] [place]na [place] [facility] [address] [phone]         Jabier Mutton, MD. Go on 10/29/2020.   Specialty: Infectious Diseases Why: 2:00pm Contact information: [address] [place] [address] [phone]  Contact information for after-discharge care     [facility] .   Service: Skilled Nursing Contact information: [address] [place] [address] [phone]                    Allergies  Allergen Reactions   Ace Inhibitors Cough    Consultations: Surgery ID  Procedures/Studies: CT Chest W Contrast  Result Date: 10/15/2020 CLINICAL DATA:  Pneumonia, effusion or abscess suspected, xray done; Abdominal pain, acute, nonlocalized EXAM: CT CHEST, ABDOMEN, AND PELVIS WITH CONTRAST TECHNIQUE: Multidetector CT imaging of the chest, abdomen and pelvis was performed following  the standard protocol during bolus administration of intravenous contrast. CONTRAST:  79m OMNIPAQUE IOHEXOL 300 MG/ML  SOLN COMPARISON:  Chest radiograph earlier today. Abdominopelvic CT 08/15/2020 FINDINGS: CT CHEST FINDINGS Cardiovascular: Atherosclerosis of the thoracic aorta. Upper normal heart size. There are coronary artery calcifications. No pericardial effusion. Mediastinum/Nodes: No enlarged mediastinal or hilar lymph nodes. Patulous esophagus without wall thickening. No thyroid nodule. Lungs/Pleura: Small bilateral pleural effusions. Associated bibasilar atelectasis. No evidence of pneumonia. Breathing motion artifact limits detailed assessment. The previous 3 mm right lower lobe pulmonary nodule is obscured by atelectasis on the current exam. No findings of pulmonary edema. Musculoskeletal: Small sclerotic focus in the left glenoid, nonspecific. No acute osseous abnormalities. CT ABDOMEN PELVIS FINDINGS Hepatobiliary: The gallbladder is distended. Suspect pericholecystic fat stranding, however there is motion artifact through the gallbladder. There is mild generalized fat stranding in the right upper quadrant. Abnormal appearance of the liver adjacent to the gallbladder fossa with lobulated low-density that extends up to 12 mm into the adjacent liver parenchyma for example series 4, image 62. Suspect small subcapsular collection about the inferior liver tip measuring 4 x 1.7 cm, series 2, image 62, with additional small volume perihepatic ascites. Common bile duct is poorly defined, but not definitively distended. No definitive calcified gallstone. Pancreas: Atrophic. No definite peripancreatic fat stranding. No ductal dilatation. Spleen: Normal in size without focal abnormality. Adrenals/Urinary Tract: No adrenal nodule. There is early excretion of IV contrast in both renal collecting systems. No hydronephrosis. Limited assessment for renal calculi given contrast in the collecting systems. Cyst in the  lateral left kidney, similar in appearance to prior. Hyperdense lesion in the mid right kidney is not well-defined on the current exam. Excreted IV contrast in the urinary bladder which is partially distended. Equivocal perivesicular fat stranding. Stomach/Bowel: The stomach is decompressed. There is fat stranding in the right upper quadrant adjacent to the distal stomach and proximal duodenum, detailed assessment is obscured by motion. Motion limits assessment for wall thickening in this region. There is a duodenal diverticulum. Fluid-filled nondilated distal small bowel. No obstruction. Normal appendix. Small to moderate volume of colonic stool. Left colonic diverticulosis. No diverticulitis. Vascular/Lymphatic: Aortic atherosclerosis. No aortic aneurysm. The portal vein is patent. There is no portal venous or mesenteric gas. Limited assessment for upper abdominal adenopathy due to motion. There is no retroperitoneal or pelvic adenopathy. Reproductive: Brachytherapy seeds in the prostate. Other: Fat stranding and inflammatory changes in the right upper quadrant. Small amount of perihepatic ascites. Questionable subcapsular collection is described. There is no free intra-abdominal air. No abdominal wall hernia. Musculoskeletal: Punctate bone island in the L5, unchanged. Lucent lesion within L1 vertebral body and pedicle is not significantly changed. Mixed density lesion in the right iliac bone near the sacroiliac joint is also stable. IMPRESSION: 1. Small bilateral pleural effusions with bibasilar  atelectasis. 2. Aortic atherosclerosis is well as coronary artery calcifications. 3. Gallbladder distension. There is generalized fat stranding in the right upper quadrant the may be secondary to gallbladder inflammation. Motion artifact limits detailed assessment. Abnormal appearance of the liver and a adjacent to the gallbladder fossa, not seen on prior exams. This may represent extension of inflammatory changes or  invasion. Suspected small subcapsular collection at the inferior liver tip measuring 4 x 1.7 cm. Small volume perihepatic ascites. Overall findings suggest complicated cholecystitis. Suggest initial assessment with ultrasound. While MRCP may be considered to further delineate the hepatic biliary changes, given inability to hold still for CT, MRI is not recommended at this time. 4. Colonic diverticulosis without diverticulitis. Additional stable chronic findings as described Aortic Atherosclerosis (ICD10-I70.0). Electronically Signed   By: Keith Rake M.D.   On: 10/15/2020 23:18   CT Abdomen Pelvis W Contrast  Result Date: 10/15/2020 CLINICAL DATA:  Pneumonia, effusion or abscess suspected, xray done; Abdominal pain, acute, nonlocalized EXAM: CT CHEST, ABDOMEN, AND PELVIS WITH CONTRAST TECHNIQUE: Multidetector CT imaging of the chest, abdomen and pelvis was performed following the standard protocol during bolus administration of intravenous contrast. CONTRAST:  63m OMNIPAQUE IOHEXOL 300 MG/ML  SOLN COMPARISON:  Chest radiograph earlier today. Abdominopelvic CT 08/15/2020 FINDINGS: CT CHEST FINDINGS Cardiovascular: Atherosclerosis of the thoracic aorta. Upper normal heart size. There are coronary artery calcifications. No pericardial effusion. Mediastinum/Nodes: No enlarged mediastinal or hilar lymph nodes. Patulous esophagus without wall thickening. No thyroid nodule. Lungs/Pleura: Small bilateral pleural effusions. Associated bibasilar atelectasis. No evidence of pneumonia. Breathing motion artifact limits detailed assessment. The previous 3 mm right lower lobe pulmonary nodule is obscured by atelectasis on the current exam. No findings of pulmonary edema. Musculoskeletal: Small sclerotic focus in the left glenoid, nonspecific. No acute osseous abnormalities. CT ABDOMEN PELVIS FINDINGS Hepatobiliary: The gallbladder is distended. Suspect pericholecystic fat stranding, however there is motion artifact  through the gallbladder. There is mild generalized fat stranding in the right upper quadrant. Abnormal appearance of the liver adjacent to the gallbladder fossa with lobulated low-density that extends up to 12 mm into the adjacent liver parenchyma for example series 4, image 62. Suspect small subcapsular collection about the inferior liver tip measuring 4 x 1.7 cm, series 2, image 62, with additional small volume perihepatic ascites. Common bile duct is poorly defined, but not definitively distended. No definitive calcified gallstone. Pancreas: Atrophic. No definite peripancreatic fat stranding. No ductal dilatation. Spleen: Normal in size without focal abnormality. Adrenals/Urinary Tract: No adrenal nodule. There is early excretion of IV contrast in both renal collecting systems. No hydronephrosis. Limited assessment for renal calculi given contrast in the collecting systems. Cyst in the lateral left kidney, similar in appearance to prior. Hyperdense lesion in the mid right kidney is not well-defined on the current exam. Excreted IV contrast in the urinary bladder which is partially distended. Equivocal perivesicular fat stranding. Stomach/Bowel: The stomach is decompressed. There is fat stranding in the right upper quadrant adjacent to the distal stomach and proximal duodenum, detailed assessment is obscured by motion. Motion limits assessment for wall thickening in this region. There is a duodenal diverticulum. Fluid-filled nondilated distal small bowel. No obstruction. Normal appendix. Small to moderate volume of colonic stool. Left colonic diverticulosis. No diverticulitis. Vascular/Lymphatic: Aortic atherosclerosis. No aortic aneurysm. The portal vein is patent. There is no portal venous or mesenteric gas. Limited assessment for upper abdominal adenopathy due to motion. There is no retroperitoneal or pelvic adenopathy. Reproductive: Brachytherapy seeds in the prostate.  Other: Fat stranding and inflammatory  changes in the right upper quadrant. Small amount of perihepatic ascites. Questionable subcapsular collection is described. There is no free intra-abdominal air. No abdominal wall hernia. Musculoskeletal: Punctate bone island in the L5, unchanged. Lucent lesion within L1 vertebral body and pedicle is not significantly changed. Mixed density lesion in the right iliac bone near the sacroiliac joint is also stable. IMPRESSION: 1. Small bilateral pleural effusions with bibasilar atelectasis. 2. Aortic atherosclerosis is well as coronary artery calcifications. 3. Gallbladder distension. There is generalized fat stranding in the right upper quadrant the may be secondary to gallbladder inflammation. Motion artifact limits detailed assessment. Abnormal appearance of the liver and a adjacent to the gallbladder fossa, not seen on prior exams. This may represent extension of inflammatory changes or invasion. Suspected small subcapsular collection at the inferior liver tip measuring 4 x 1.7 cm. Small volume perihepatic ascites. Overall findings suggest complicated cholecystitis. Suggest initial assessment with ultrasound. While MRCP may be considered to further delineate the hepatic biliary changes, given inability to hold still for CT, MRI is not recommended at this time. 4. Colonic diverticulosis without diverticulitis. Additional stable chronic findings as described Aortic Atherosclerosis (ICD10-I70.0). Electronically Signed   By: Keith Rake M.D.   On: 10/15/2020 23:18   DG Chest Port 1 View  Result Date: 10/15/2020 CLINICAL DATA:  Fever and altered mental status. EXAM: PORTABLE CHEST 1 VIEW COMPARISON:  Chest radiograph dated 10/05/2018. FINDINGS: Shallow inspiration. Small left pleural effusion and left lung base atelectasis. Infiltrate is not excluded clinical correlation recommended. There is no pneumothorax. The cardiac silhouette is within normal limits. No acute osseous pathology. IMPRESSION: Small left  pleural effusion and left lung base atelectasis versus infiltrate. Electronically Signed   By: Anner Crete M.D.   On: 10/15/2020 20:19   ECHOCARDIOGRAM COMPLETE  Result Date: 10/17/2020    ECHOCARDIOGRAM REPORT   Patient Name:   ALEKXANDER GROSSBERG Date of Exam: 10/17/2020 Medical Rec #:  [id number]       Height:       75.0 in Accession #:    [id number]      Weight:       278.2 lb Date of Birth:  05-15-1945       BSA:          2.525 m Patient Age:    34 years        BP:           135/73 mmHg Patient Gender: M               HR:           66 bpm. Exam Location:  [facility] Procedure: 2D Echo, Cardiac Doppler and Color Doppler Indications:    Bacteremia  History:        Patient has prior history of Echocardiogram examinations, most                 recent 02/20/2010. Signs/Symptoms:Altered Mental Status and CKD,                 sepsis; Risk Factors:Diabetes, Hypertension, Dyslipidemia and                 Morbid obesity. Acute cholecystitis, pleural effusion.  Sonographer:    Dustin Flock RDCS Referring Phys: [place]  1. Left ventricular ejection fraction, by estimation, is 60 to 65%. The left ventricle has normal function. The left ventricle has no regional wall motion abnormalities. Left  ventricular diastolic parameters were normal.  2. RV-RA gradient 38 mmHg suggests at least mildly elevated RVSP. Right ventricular systolic function is normal. The right ventricular size is normal.  3. There is a trivial pericardial effusion that is circumferential.  4. The mitral valve is grossly normal. Mild mitral valve regurgitation.  5. The aortic valve is tricuspid. There is mild calcification of the aortic valve. Aortic valve regurgitation is not visualized.  6. Unable to estimate CVP.  7. No obvious valvular vegetations. Comparison(s): No prior Echocardiogram. FINDINGS  Left Ventricle: Left ventricular ejection fraction, by estimation, is 60 to 65%. The left ventricle has normal function. The left  ventricle has no regional wall motion abnormalities. The left ventricular internal cavity size was normal in size. There is  borderline left ventricular hypertrophy. Left ventricular diastolic parameters were normal. Right Ventricle: RV-RA gradient 38 mmHg suggests at least mildly elevated RVSP. The right ventricular size is normal. No increase in right ventricular wall thickness. Right ventricular systolic function is normal. Left Atrium: Left atrial size was normal in size. Right Atrium: Right atrial size was normal in size. Pericardium: Trivial pericardial effusion is present. The pericardial effusion is circumferential. Mitral Valve: The mitral valve is grossly normal. Mild mitral annular calcification. Mild mitral valve regurgitation. Tricuspid Valve: The tricuspid valve is grossly normal. Tricuspid valve regurgitation is mild. Aortic Valve: The aortic valve is tricuspid. There is mild calcification of the aortic valve. There is mild aortic valve annular calcification. Aortic valve regurgitation is not visualized. Pulmonic Valve: The pulmonic valve was grossly normal. Pulmonic valve regurgitation is not visualized. Aorta: The aortic root is normal in size and structure. Venous: Unable to estimate CVP. The inferior vena cava was not well visualized. IAS/Shunts: No atrial level shunt detected by color flow Doppler.  LEFT VENTRICLE PLAX 2D LVIDd:         5.23 cm  Diastology LVIDs:         2.83 cm  LV e' medial:    6.92 cm/s LV PW:         1.06 cm  LV E/e' medial:  9.3 LV IVS:        1.04 cm  LV e' lateral:   8.32 cm/s LVOT diam:     2.20 cm  LV E/e' lateral: 7.7 LV SV:         101 LV SV Index:   40 LVOT Area:     3.80 cm  RIGHT VENTRICLE RV Basal diam:  3.28 cm RV S prime:     4.35 cm/s TAPSE (M-mode): 2.6 cm LEFT ATRIUM             Index       RIGHT ATRIUM           Index LA diam:        3.70 cm 1.47 cm/m  RA Area:     19.60 cm LA Vol (A2C):   63.1 ml 24.99 ml/m RA Volume:   61.90 ml  24.52 ml/m LA Vol  (A4C):   54.2 ml 21.47 ml/m LA Biplane Vol: 59.7 ml 23.64 ml/m  AORTIC VALVE LVOT Vmax:   115.00 cm/s LVOT Vmean:  77.200 cm/s LVOT VTI:    0.265 m  AORTA Ao Root diam: 3.10 cm MITRAL VALVE               TRICUSPID VALVE MV Area (PHT): 3.99 cm    TR Peak grad:   38.2 mmHg MV Decel Time: 190 msec  TR Vmax:        309.00 cm/s MV E velocity: 64.30 cm/s MV A velocity: 57.00 cm/s  SHUNTS MV E/A ratio:  1.13        Systemic VTI:  0.26 m                            Systemic Diam: 2.20 cm Rozann Lesches MD Electronically signed by Rozann Lesches MD Signature Date/Time: 10/17/2020/11:15:33 AM    Final    US Abdomen Limited RUQ (LIVER/GB)  Result Date: 10/16/2020 CLINICAL DATA:  Acute cholecystitis. EXAM: ULTRASOUND ABDOMEN LIMITED RIGHT UPPER QUADRANT COMPARISON:  CT AP 10/15/2020 FINDINGS: Gallbladder: Gallbladder wall appears thickened. By my measurements the gallbladder measures up to 4 mm. There is mild pericholecystic fluid identified. No gallstones identified. Small volume of sludge noted within the gallbladder. Negative sonographic Murphy's sign reported. Common bile duct: Diameter: 3.5 mm Liver: Small fluid collection adjacent to the gallbladder in the small parenchymal fluid collections within segment 4 of the liver adjacent to the gallbladder are better seen on the CT from yesterday. Portal vein is patent on color Doppler imaging with normal direction of blood flow towards the liver. Other: None. IMPRESSION: 1. Gallbladder wall thickening, sludge and pericholecystic fluid identified. Cannot exclude acalculous cholecystitis. 2. Fluid collection adjacent to the gallbladder and intraparenchymal low-density foci within segment 4 (also adjacent to the gallbladder) are better seen on the contrast enhanced CT of the abdomen from 10/15/2020. Electronically Signed   By: Kerby Moors M.D.   On: 10/16/2020 11:19      Subjective: Feels worn out, still with right-sided upper abdominal pain with movement and  palpation, though this is not limiting his oral intake. No fevers or bleeding reported.   Discharge Exam: Vitals:   10/19/20 2101 10/20/20 0611  BP: (!) 163/45 (!) 147/59  Pulse: 68 67  Resp: 20 18  Temp: 98.2 F (36.8 C) 98.3 F (36.8 C)  SpO2: 95% 93%   General: Pt is alert, awake, not in acute distress Cardiovascular: RRR, S1/S2 +, no rubs, no gallops Respiratory: CTA bilaterally, no wheezing, no rhonchi Abdominal: Soft, tender over lateral RUQ without Murphy's sign. Nondistended, +BS. Extremities: No edema, no cyanosis  Labs:  Basic Metabolic Panel: Recent Labs  Lab 10/16/20 0451 10/17/20 0708 10/18/20 0747 10/19/20 0554 10/20/20 0633  NA 134* 133* 132* 130* 132*  K 3.6 3.5 3.4* 3.3* 3.7  CL 100 101 97* 98 100  CO2 '26 25 24 23 25  '$ GLUCOSE 250* 119* 156* 154* 180*  BUN 42* 55* 59* 52* 45*  CREATININE 1.74* 1.99* 2.08* 1.84* 1.71*  CALCIUM 8.1* 7.8* 8.0* 7.7* 7.6*  MG 1.6*  --   --   --   --    Liver Function Tests: Recent Labs  Lab 10/15/20 2044 10/16/20 0451 10/17/20 0708 10/19/20 0554  AST 67* 67* 52* 36  ALT 66* 77* 76* 52*  ALKPHOS 41 43 52 74  BILITOT 1.3* 1.0 0.8 1.0  PROT 6.3* 5.7* 5.8* 5.7*  ALBUMIN 3.0* 2.4* 2.3* 2.1*   No results for input(s): LIPASE, AMYLASE in the last 168 hours. No results for input(s): AMMONIA in the last 168 hours. CBC: Recent Labs  Lab 10/15/20 2044 10/16/20 0451 10/17/20 0708 10/19/20 0554 10/20/20 0633  WBC 20.3* 17.5* 15.1* 18.2* 17.4*  NEUTROABS 18.6*  --   --  16.0* 14.6*  HGB 12.0* 11.5* 11.3* 12.5* 10.8*  HCT 36.7* 33.8* 34.1* 36.7* 31.9*  MCV 100.8* 98.3 98.6 96.1 96.7  PLT 126* 112* 124* 148* 160   Cardiac Enzymes: No results for input(s): CKTOTAL, CKMB, CKMBINDEX, TROPONINI in the last 168 hours. BNP: Invalid input(s): POCBNP CBG: Recent Labs  Lab 10/19/20 0812 10/19/20 1115 10/19/20 1656 10/19/20 2100 10/20/20 0803  GLUCAP 182* 190* 180* 179* 176*   D-Dimer No results for input(s):  DDIMER in the last 72 hours. Hgb A1c No results for input(s): HGBA1C in the last 72 hours. Lipid Profile No results for input(s): CHOL, HDL, LDLCALC, TRIG, CHOLHDL, LDLDIRECT in the last 72 hours. Thyroid function studies No results for input(s): TSH, T4TOTAL, T3FREE, THYROIDAB in the last 72 hours.  Invalid input(s): FREET3 Anemia work up No results for input(s): VITAMINB12, FOLATE, FERRITIN, TIBC, IRON, RETICCTPCT in the last 72 hours. Urinalysis    Component Value Date/Time   COLORURINE AMBER (A) 10/16/2020 0036   APPEARANCEUR CLOUDY (A) 10/16/2020 0036   LABSPEC 1.041 (H) 10/16/2020 0036   LABSPEC 1.020 12/27/2018 1315   PHURINE 5.0 10/16/2020 0036   GLUCOSEU 50 (A) 10/16/2020 0036   HGBUR SMALL (A) 10/16/2020 0036   BILIRUBINUR NEGATIVE 10/16/2020 0036   BILIRUBINUR negative 12/27/2018 1315   BILIRUBINUR neg 06/23/2016 1050   KETONESUR 5 (A) 10/16/2020 0036   PROTEINUR 100 (A) 10/16/2020 0036   UROBILINOGEN negative (A) 06/23/2016 1050   NITRITE NEGATIVE 10/16/2020 0036   LEUKOCYTESUR NEGATIVE 10/16/2020 0036    Microbiology Recent Results (from the past 240 hour(s))  Culture, blood (Routine x 2)     Status: Abnormal   Collection Time: 10/15/20  8:44 PM   Specimen: BLOOD  Result Value Ref Range Status   Specimen Description   Final    BLOOD BLOOD RIGHT HAND Performed at [facility], [address], [place], [place] [address]    Special Requests   Final    BOTTLES DRAWN AEROBIC AND ANAEROBIC Blood Culture adequate volume Performed at [facility], [address], [place], [place] [address]    Culture  Setup Time   Final    GRAM POSITIVE COCCI IN BOTH AEROBIC AND ANAEROBIC BOTTLES CRITICAL VALUE NOTED.  VALUE IS CONSISTENT WITH PREVIOUSLY REPORTED AND CALLED VALUE. Performed at [place] TO, READ BACK BY AND VERIFIED WITH: B ROBERTSON RN 1856 10/16/20 A BROWNING    Culture (A)  Final    STREPTOCOCCUS  GALLOLYTICUS SUSCEPTIBILITIES PERFORMED ON PREVIOUS CULTURE WITHIN THE LAST 5 DAYS. Performed at [facility], [place] [address], [place], [place] [address]    Report Status 10/18/2020 FINAL  Final  Culture, blood (Routine x 2)     Status: Abnormal   Collection Time: 10/15/20  8:44 PM   Specimen: BLOOD  Result Value Ref Range Status   Specimen Description   Final    BLOOD BLOOD LEFT HAND Performed at [facility], [address], [place], [place] [address]    Special Requests   Final    BOTTLES DRAWN AEROBIC AND ANAEROBIC Blood Culture adequate volume Performed at [facility], [address] [place], [place] [address]    Culture  Setup Time   Final    GRAM POSITIVE COCCI IN BOTH AEROBIC AND ANAEROBIC BOTTLES Gram Stain Report Called to,Read Back By and Verified With: OAKLEY,B AT 0945 ON 8.18.22 BY RUCINSKI,B Performed at [place] ID to follow CRITICAL RESULT CALLED TO, READ BACK BY AND VERIFIED WITHLetitia Neri RN 1856 10/16/20 A BROWNING Performed at [facility], [address], [place], [place] [address]    Culture STREPTOCOCCUS GALLOLYTICUS (A)  Final   Report Status 10/18/2020 FINAL  Final   Organism ID, Bacteria STREPTOCOCCUS GALLOLYTICUS  Final      Susceptibility   Streptococcus gallolyticus - MIC*    PENICILLIN 0.12 SENSITIVE Sensitive     CEFTRIAXONE 0.25 SENSITIVE Sensitive     ERYTHROMYCIN <=0.12 SENSITIVE Sensitive     LEVOFLOXACIN 2 SENSITIVE Sensitive     VANCOMYCIN <=0.12 SENSITIVE Sensitive     * STREPTOCOCCUS GALLOLYTICUS  Blood Culture ID Panel (Reflexed)     Status: Abnormal   Collection Time: 10/15/20  8:44 PM  Result Value Ref Range Status   Enterococcus faecalis NOT DETECTED NOT DETECTED Final   Enterococcus Faecium NOT DETECTED NOT DETECTED Final   Listeria monocytogenes NOT DETECTED NOT DETECTED Final   Staphylococcus species NOT DETECTED NOT DETECTED Final   Staphylococcus aureus (BCID) NOT DETECTED NOT DETECTED Final    Staphylococcus epidermidis NOT DETECTED NOT DETECTED Final   Staphylococcus lugdunensis NOT DETECTED NOT DETECTED Final   Streptococcus species DETECTED (A) NOT DETECTED Final    Comment: Not Enterococcus species, Streptococcus agalactiae, Streptococcus pyogenes, or Streptococcus pneumoniae. CRITICAL RESULT CALLED TO, READ BACK BY AND VERIFIED WITH: B ROBERTSON RN 1856 10/16/20 A BROWNING    Streptococcus agalactiae NOT DETECTED NOT DETECTED Final   Streptococcus pneumoniae NOT DETECTED NOT DETECTED Final   Streptococcus pyogenes NOT DETECTED NOT DETECTED Final   A.calcoaceticus-baumannii NOT DETECTED NOT DETECTED Final   Bacteroides fragilis NOT DETECTED NOT DETECTED Final   Enterobacterales NOT DETECTED NOT DETECTED Final   Enterobacter cloacae complex NOT DETECTED NOT DETECTED Final   Escherichia coli NOT DETECTED NOT DETECTED Final   Klebsiella aerogenes NOT DETECTED NOT DETECTED Final   Klebsiella oxytoca NOT DETECTED NOT DETECTED Final   Klebsiella pneumoniae NOT DETECTED NOT DETECTED Final   Proteus species NOT DETECTED NOT DETECTED Final   Salmonella species NOT DETECTED NOT DETECTED Final   Serratia marcescens NOT DETECTED NOT DETECTED Final   Haemophilus influenzae NOT DETECTED NOT DETECTED Final   Neisseria meningitidis NOT DETECTED NOT DETECTED Final   Pseudomonas aeruginosa NOT DETECTED NOT DETECTED Final   Stenotrophomonas maltophilia NOT DETECTED NOT DETECTED Final   Candida albicans NOT DETECTED NOT DETECTED Final   Candida auris NOT DETECTED NOT DETECTED Final   Candida glabrata NOT DETECTED NOT DETECTED Final   Candida krusei NOT DETECTED NOT DETECTED Final   Candida parapsilosis NOT DETECTED NOT DETECTED Final   Candida tropicalis NOT DETECTED NOT DETECTED Final   Cryptococcus neoformans/gattii NOT DETECTED NOT DETECTED Final    Comment: Performed at [facility], [address], [place], [place] [address]  Resp Panel by RT-PCR (Flu A&B, Covid)  Nasopharyngeal Swab     Status: None   Collection Time: 10/15/20  8:45 PM   Specimen: Nasopharyngeal Swab; Nasopharyngeal(NP) swabs in vial transport medium  Result Value Ref Range Status   SARS Coronavirus 2 by RT PCR NEGATIVE NEGATIVE Final    Comment: (NOTE) SARS-CoV-2 target nucleic acids are NOT DETECTED.  The SARS-CoV-2 RNA is generally detectable in upper respiratory specimens during the acute phase of infection. The lowest concentration of SARS-CoV-2 viral copies this assay can detect is 138 copies/mL. A negative result does not preclude SARS-Cov-2 infection and should not be used as the sole basis for treatment or other patient management decisions. A negative result may occur with  improper specimen collection/handling, submission of specimen other than  nasopharyngeal swab, presence of viral mutation(s) within the areas targeted by this assay, and inadequate number of viral copies(<138 copies/mL). A negative result must be combined with clinical observations, patient history, and epidemiological information. The expected result is Negative.  Fact Sheet for Patients:  EntrepreneurPulse.com.au  Fact Sheet for Healthcare Providers:  IncredibleEmployment.be  This test is no t yet approved or cleared by the [place] FDA and  has been authorized for detection and/or diagnosis of SARS-CoV-2 by FDA under an Emergency Use Authorization (EUA). This EUA will remain  in effect (meaning this test can be used) for the duration of the COVID-19 declaration under Section 564(b)(1) of the Act, 21 U.S.C.section 360bbb-3(b)(1), unless the authorization is terminated  or revoked sooner.       Influenza A by PCR NEGATIVE NEGATIVE Final   Influenza B by PCR NEGATIVE NEGATIVE Final    Comment: (NOTE) The Xpert Xpress SARS-CoV-2/FLU/RSV plus assay is intended as an aid in the diagnosis of influenza from Nasopharyngeal swab specimens and should not be  used as a sole basis for treatment. Nasal washings and aspirates are unacceptable for Xpert Xpress SARS-CoV-2/FLU/RSV testing.  Fact Sheet for Patients: EntrepreneurPulse.com.au  Fact Sheet for Healthcare Providers: IncredibleEmployment.be  This test is not yet approved or cleared by the [place] FDA and has been authorized for detection and/or diagnosis of SARS-CoV-2 by FDA under an Emergency Use Authorization (EUA). This EUA will remain in effect (meaning this test can be used) for the duration of the COVID-19 declaration under Section 564(b)(1) of the Act, 21 U.S.C. section 360bbb-3(b)(1), unless the authorization is terminated or revoked.  Performed at [facility], [address], [place], [place] [address]   Urine Culture     Status: Abnormal   Collection Time: 10/16/20 12:36 AM   Specimen: In/Out Cath Urine  Result Value Ref Range Status   Specimen Description   Final    IN/OUT CATH URINE Performed at [facility] [address] [place], [place] [address]    Special Requests   Final    NONE Performed at [facility], [address], [place], [place] [address]    Culture MULTIPLE SPECIES PRESENT, SUGGEST RECOLLECTION (A)  Final   Report Status 10/17/2020 FINAL  Final  Culture, blood (routine x 2)     Status: None (Preliminary result)   Collection Time: 10/17/20  7:09 AM   Specimen: BLOOD  Result Value Ref Range Status   Specimen Description BLOOD LEFT ANTECUBITAL  Final   Special Requests   Final    BOTTLES DRAWN AEROBIC AND ANAEROBIC Blood Culture adequate volume   Culture   Final    NO GROWTH 3 DAYS Performed at [facility], [address], [place], [place] [address]    Report Status PENDING  Incomplete  Culture, blood (routine x 2)     Status: None (Preliminary result)   Collection Time: 10/17/20  7:10 AM   Specimen: BLOOD  Result Value Ref Range Status   Specimen Description BLOOD RIGHT ANTECUBITAL  Final   Special  Requests   Final    BOTTLES DRAWN AEROBIC AND ANAEROBIC Blood Culture adequate volume   Culture   Final    NO GROWTH 3 DAYS Performed at [facility], [address], [place], [place] [address]    Report Status PENDING  Incomplete    Time coordinating discharge: Approximately 40 minutes  Patrecia Pour, MD  [facility] 10/20/2020, 8:33 AM

## 2020-10-20 NOTE — Progress Notes (Signed)
Report has been given to the [place].

## 2020-10-20 NOTE — TOC Transition Note (Addendum)
Transition of Care [facility]) - CM/SW Discharge Note   Patient Details  Name: James Glover MRN: [id number] Date of Birth: 12/21/1945  [facility] CM/SW Contact:  Salome Arnt, [place] Phone Number: 10/20/2020, 8:52 AM   Clinical Narrative:  Pt d/c today to [facility]. Auth received. Pt's wife aware and agreeable. D/C summary sent. RN given number to call report.    Update: [facility]NC called and said wife wanted to look at other SNF options because they do not have a private room. LCSW discussed with supervisor and informed pt's wife that pt has a safe d/c plan and would need to stick with original plan to d/c to [facility]. Pt's wife is aware she can work on placement at different facility after d/c to [facility]. She plans to discuss with husband but reports understanding. Numbers given to wife of [place] facilities she expressed interest in.     Final next level of care: Skilled Nursing Facility Barriers to Discharge: Barriers Resolved   Patient Goals and CMS Choice Patient states their goals for this hospitalization and ongoing recovery are:: Return home CMS Medicare.gov Compare Post Acute Care list provided to:: Patient Choice offered to / list presented to : Patient  Discharge Placement   Existing PASRR number confirmed : 10/20/20            Patient to be transferred to facility by: [facility] staff Name of family member notified: Silverio Lay- wife Patient and family notified of of transfer: 10/20/20  Discharge Plan and Services In-house Referral: Clinical Social Work Discharge Planning Services: CM Consult            DME Arranged: N/A                    Social Determinants of Health ([place]) Interventions     Readmission Risk Interventions Readmission Risk Prevention Plan 10/16/2020  Transportation Screening Complete  HRI or Home Care Consult Complete  Social Work Consult for [place] Planning/Counseling Complete  Palliative Care Screening Not Applicable  Medication  Review Press photographer) Complete  Some recent data might be hidden

## 2020-10-21 ENCOUNTER — Encounter: Payer: Self-pay | Admitting: Adult Health

## 2020-10-21 ENCOUNTER — Encounter: Payer: Self-pay | Admitting: Family Medicine

## 2020-10-21 ENCOUNTER — Non-Acute Institutional Stay (SKILLED_NURSING_FACILITY): Payer: Medicare PPO | Admitting: Adult Health

## 2020-10-21 DIAGNOSIS — K810 Acute cholecystitis: Secondary | ICD-10-CM | POA: Diagnosis not present

## 2020-10-21 DIAGNOSIS — D696 Thrombocytopenia, unspecified: Secondary | ICD-10-CM

## 2020-10-21 DIAGNOSIS — Z794 Long term (current) use of insulin: Secondary | ICD-10-CM

## 2020-10-21 DIAGNOSIS — E785 Hyperlipidemia, unspecified: Secondary | ICD-10-CM

## 2020-10-21 DIAGNOSIS — I129 Hypertensive chronic kidney disease with stage 1 through stage 4 chronic kidney disease, or unspecified chronic kidney disease: Secondary | ICD-10-CM

## 2020-10-21 DIAGNOSIS — E1169 Type 2 diabetes mellitus with other specified complication: Secondary | ICD-10-CM | POA: Diagnosis not present

## 2020-10-21 DIAGNOSIS — K750 Abscess of liver: Secondary | ICD-10-CM

## 2020-10-21 DIAGNOSIS — C61 Malignant neoplasm of prostate: Secondary | ICD-10-CM

## 2020-10-21 DIAGNOSIS — A419 Sepsis, unspecified organism: Secondary | ICD-10-CM

## 2020-10-21 DIAGNOSIS — I700 Atherosclerosis of aorta: Secondary | ICD-10-CM

## 2020-10-21 DIAGNOSIS — E669 Obesity, unspecified: Secondary | ICD-10-CM

## 2020-10-21 DIAGNOSIS — K219 Gastro-esophageal reflux disease without esophagitis: Secondary | ICD-10-CM

## 2020-10-21 DIAGNOSIS — E1122 Type 2 diabetes mellitus with diabetic chronic kidney disease: Secondary | ICD-10-CM | POA: Diagnosis not present

## 2020-10-21 DIAGNOSIS — E66811 Obesity, class 1: Secondary | ICD-10-CM

## 2020-10-21 DIAGNOSIS — D638 Anemia in other chronic diseases classified elsewhere: Secondary | ICD-10-CM | POA: Insufficient documentation

## 2020-10-21 DIAGNOSIS — N183 Chronic kidney disease, stage 3 unspecified: Secondary | ICD-10-CM | POA: Insufficient documentation

## 2020-10-21 DIAGNOSIS — N400 Enlarged prostate without lower urinary tract symptoms: Secondary | ICD-10-CM

## 2020-10-21 NOTE — Progress Notes (Signed)
Location:  [place] Room Number: 111 Place of Service:  SNF (31)   CODE STATUS: full code   Allergies  Allergen Reactions   Ace Inhibitors Cough    Chief Complaint  Patient presents with   Hospitalization Follow-up    HPI:  He is a 75 year old man who has been hospitalized from 10-15-20 through 10-20-20. His medical history includes: diabetes prostate cancer status post radiation therapy in remission; hypertension. He is usually independent with his adls; ambulates independently does drive. He had 4 days of abdominal pain; states he could not get out of bed. He presented to the ED with a 100.2 fever; thrombocytopenia. He was found to have right upper quadrant perihepatic ascites concerning for acute cholecystitis. He was treated for sepsis with lactic acidosis due to acute acalculous cholecystitis complicated by subcapsular live abscess and S. Gallolyticus. His lactic acid has cleared. He  was started on broad spectrum antibiotics. His blood cultures: streptococcus gallolyticus he had a improvement in symptoms and leukocytosis. He was converted to oral augmentin through 11-14-20. He will need an outpatient GI for colonoscopy due to S. Bovis/gallolyticus bacteremia. He acute metabolic encephalopathy he is improving; but does remain slightly disoriented. He is here for short term rehab with his goal to return back home. His abdominal pain is improving. He has a poor appetite; but is able to keep his food down. He denies any nausea. He will continue to be followed for his chronic illnesses including: Aortic atherosclerosis:  Benign hypertension with CKD stage III:  Controlled type 2 diabetes mellitus with stage 3 chronic kidney disease with long term current use of insulin: Prostate cancer   Past Medical History:  Diagnosis Date   CKD (chronic kidney disease), stage III [facility])    nephrologist-- [place]--- per lov note 03-08-2018 in epic, stable (baseline Cr 1.5 - 2)    Dyslipidemia    Erectile dysfunction    First degree heart block    GERD (gastroesophageal reflux disease)    Hemorrhoids    internal and external   History of nuclear stress test    05-26-2009 (by dr Rollene Fare due to HTN and DM2)--- normal study w/ no ischemia,  normal LV function and wall motion , ef 70%   Hyperplasia of prostate with lower urinary tract symptoms (LUTS)    Hypertension    Hypogonadism male    Iron deficiency    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 Tresa Moore   Prostate cancer [facility]) urologist-  dr t. Tresa Moore  oncologsit-  dr Tammi Klippel   dx 05-30-2018-- Stage T1c, Gleason 4+5, High Risk   Renal cyst, left    followed by dr t. Tresa Moore--  chronic noncomplex   Type 2 diabetes mellitus ([place])    followed by pcp   Wears partial dentures    upper    Past Surgical History:  Procedure Laterality Date   CIRCUMCISION  age 83   CYSTOSCOPY N/A 11/03/2018   Procedure: CYSTOSCOPY FLEXIBLE;  Surgeon: Alexis Frock, MD;  Location: [facility];  Service: Urology;  Laterality: N/A;  NO SEEDS FOUND IN BLADDER   KNEE ARTHROSCOPY Right 11/14/2017   dr Stann Mainland '@[facility]'$    RADIOACTIVE SEED IMPLANT N/A 11/03/2018   Procedure: RADIOACTIVE SEED IMPLANT/BRACHYTHERAPY IMPLANT;  Surgeon: Alexis Frock, MD;  Location[facility]  Service: Urology;  Laterality: N/A;       SEEDS IMPLANTED   SPACE OAR INSTILLATION N/A  11/03/2018   Procedure: SPACE OAR INSTILLATION;  Surgeon: Alexis Frock, MD;  Location: [facility];  Service: Urology;  Laterality: N/A;    Social History   Socioeconomic History   Marital status: Married    Spouse name: Not on file   Number of children: 0   Years of education: Not on file   Highest education level: Not on file  Occupational History   Occupation: police officer--retired    Employer: [employer]  Tobacco Use   Smoking status: Never   Smokeless tobacco: Never  Vaping Use   Vaping  Use: Never used  Substance and Sexual Activity   Alcohol use: Yes    Alcohol/week: 3.0 standard drinks    Types: 3 Glasses of wine per week    Comment: 3 glasses of wine per week   Drug use: Never   Sexual activity: Yes  Other Topics Concern   Not on file  Social History Narrative   Retired 06/2011.  Lives with wife, 1 cat.   Has a place on [place], spends 1-2 weeks/month there   Has 9 siblings      Updated 01/2020   Social Determinants of Health   Financial Resource Strain: Not on file  Food Insecurity: Not on file  Transportation Needs: Not on file  Physical Activity: Not on file  Stress: Not on file  Social Connections: Not on file  Intimate Partner Violence: Not on file   Family History  Problem Relation Age of Onset   Lymphoma Father    Cancer Father        lymphoma   Hypertension Father    Lymphoma Brother    Cancer Brother        lymphoma   Dementia Mother    Diabetes Mother    Diabetes Sister    Cancer Brother        skin cancer   Diabetes Brother    Lymphoma Paternal Grandfather    Cancer Paternal Grandfather        lymphoma   Diabetes Brother    Kidney disease Brother    COPD Brother    Heart disease Neg Hx       VITAL SIGNS BP (!) 148/80   Pulse 78   Temp 98.6 F (37 C)   Ht '6\' 3"'$  (1.905 m)   Wt 282 lb (127.9 kg)   BMI 35.25 kg/m   Outpatient Encounter Medications as of 10/21/2020  Medication Sig Note   ACCU-CHEK GUIDE test strip USE AS DIRECTED    Accu-Chek Softclix Lancets lancets 1 each by Other route 2 (two) times daily. Use as instructed    amoxicillin-clavulanate (AUGMENTIN) 875-125 MG tablet Take 1 tablet by mouth every 12 (twelve) hours for 25 days.    aspirin 81 MG tablet Take 81 mg by mouth daily.    famotidine (PEPCID) 40 MG tablet Take 40 mg by mouth at bedtime.    fluorouracil (EFUDEX) 5 % cream     hydrochlorothiazide (HYDRODIURIL) 25 MG tablet Take 1 tablet (25 mg total) by mouth daily. TAKE 1 TABLET(25 MG) BY MOUTH  DAILY    insulin degludec (TRESIBA FLEXTOUCH) 100 UNIT/ML FlexTouch Pen ADMINISTER 18 UNITS UNDER THE SKIN AT BEDTIME    Insulin Pen Needle (BD PEN NEEDLE NANO U/F) 32G X 4 MM MISC 1 each by Does not apply route as needed.    losartan (COZAAR) 50 MG tablet TAKE 1 TABLET BY MOUTH DAILY    Multiple Vitamins-Minerals (  MULTIVITAMIN WITH MINERALS) tablet Take 1 tablet by mouth daily.    Omega-3 Fatty Acids (FISH OIL) 1000 MG CAPS Take 1 capsule by mouth 2 (two) times daily.    omeprazole (PRILOSEC) 40 MG capsule TAKE 1 CAPSULE(40 MG) BY MOUTH IN THE MORNING AND AT BEDTIME    oxybutynin (DITROPAN) 5 MG tablet Take 5 mg by mouth 2 (two) times daily.     pioglitazone (ACTOS) 45 MG tablet Take 1 tablet (45 mg total) by mouth daily.    saccharomyces boulardii (FLORASTOR) 250 MG capsule Take 1 capsule (250 mg total) by mouth 2 (two) times daily for 25 days.    saxagliptin HCl (ONGLYZA) 2.5 MG TABS tablet TAKE 1 TABLET(2.5 MG) BY MOUTH DAILY    simvastatin (ZOCOR) 20 MG tablet TAKE 1 TABLET(20 MG) BY MOUTH AT BEDTIME    tamsulosin (FLOMAX) 0.4 MG CAPS capsule Take 1 capsule (0.4 mg total) by mouth 2 (two) times daily after a meal. For urinary urgency or weak stream.    valACYclovir (VALTREX) 1000 MG tablet Take 2,000 mg by mouth as needed (fever blister). Reported on 04/09/2015 10/10/2014: From her dermatologist [facility]); prn fever blisters   No facility-administered encounter medications on file as of 10/21/2020.     SIGNIFICANT DIAGNOSTIC EXAMS  TODAY  10-15-20: chest x-ray:  Small left pleural effusion and left lung base atelectasis versus infiltrate.  10-15-20: ct of chest abdomen and pelvis:  1. Small bilateral pleural effusions with bibasilar atelectasis. 2. Aortic atherosclerosis is well as coronary artery calcifications. 3. Gallbladder distension. There is generalized fat stranding in the right upper quadrant the may be secondary to gallbladder inflammation. Motion artifact limits detailed  assessment. Abnormal appearance of the liver and a adjacent to the gallbladder fossa, not seen on prior exams. This may represent extension of inflammatory changes or invasion. Suspected small subcapsular collection at the inferior liver tip measuring 4 x 1.7 cm. Small volume perihepatic ascites. Overall findings suggest complicated cholecystitis. Suggest initial assessment with ultrasound. While MRCP may be considered to further delineate the hepatic biliary changes, given inability to hold still for CT, MRI is not recommended at this time. 4. Colonic diverticulosis without diverticulitis. Additional stable chronic findings as described Aortic Atherosclerosis   10-16-20: abdominal ultrasound:  1. Gallbladder wall thickening, sludge and pericholecystic fluid identified. Cannot exclude acalculous cholecystitis. 2. Fluid collection adjacent to the gallbladder and intraparenchymal low-density foci within segment 4 (also adjacent to the gallbladder) are better seen on the contrast enhanced CT of the abdomen from 10/15/2020.   10-17-20: 2-d echo:  1. Left ventricular ejection fraction, by estimation, is 60 to 65%. The  left ventricle has normal function. The left ventricle has no regional  wall motion abnormalities. Left ventricular diastolic parameters were normal.   LABS REVIEWED:   08-19-20: hgb a1c 7.0 10-15-20: wbc 20.3; hgb 12.0; hct 36.7; mcv 100.8 plt 126; glucose 269; bun 40; creat 1.84; k+ 3.7; an++ 133; ca 8.1; GFR 38; ast 67; alt 66; total bili 1.3 albumin 3.0: urine culture: multiple species blood culture: streptococcus gallolyticus:  10-17-20: wbc 15.1; hgb 11.3; hct 34.1; mcv 98.6 plt 124; glucose 119; bun 55; creat 1.99; k+ 3.5; na++ 133; ca 7.8; GFR 35; ast 52; alt 76; albumin 2.3 10-19-20: wbc 18.2; hgb 12.5; hct 36.7; mcv 96.1 plt 148; glucose 154; bun 52; creat 1.84; k+ 3.3; na++ 130; ca 7.7; GFR 38; ast 52; albumin 2.1 10-20-20: wbc 17.4; hgb 10.8; hct 31.9; mcv 96.7 plt 160; glucose 180;  bun  45; creat 1.71; k+ 3.7; na++ 132; ca 7.6; GFR 41   Review of Systems  Constitutional:  Negative for malaise/fatigue.  Respiratory:  Negative for cough and shortness of breath.   Cardiovascular:  Negative for chest pain, palpitations and leg swelling.  Gastrointestinal:  Positive for abdominal pain. Negative for constipation and heartburn.       Is improving   Musculoskeletal:  Negative for back pain, joint pain and myalgias.  Skin: Negative.   Neurological:  Negative for dizziness.  Psychiatric/Behavioral:  The patient is not nervous/anxious.    Physical Exam Constitutional:      General: He is not in acute distress.    Appearance: He is well-developed. He is obese. He is not diaphoretic.  Neck:     Thyroid: No thyromegaly.  Cardiovascular:     Rate and Rhythm: Normal rate and regular rhythm.     Pulses: Normal pulses.     Heart sounds: Murmur heard.  Pulmonary:     Effort: Pulmonary effort is normal. No respiratory distress.     Breath sounds: Normal breath sounds.  Abdominal:     General: Bowel sounds are normal. There is distension.     Palpations: Abdomen is soft.     Tenderness: There is abdominal tenderness.     Comments: Is mildly tender to palpation   Musculoskeletal:        General: Normal range of motion.     Cervical back: Neck supple.     Right lower leg: No edema.     Left lower leg: No edema.  Lymphadenopathy:     Cervical: No cervical adenopathy.  Skin:    General: Skin is warm and dry.     Comments: Bilateral lower extremities discolored   Neurological:     Mental Status: He is alert. He is disoriented.     Comments: Is slight   Psychiatric:        Mood and Affect: Mood normal.     ASSESSMENT/ PLAN:  TODAY  Acute cholecystitis/liver abscess/sepsis without acute organ dysfunction unspecified organism: he will need to follow up with GI will continue augmentin 875 mg twice daily through 11-14-20. Will monitor his status.   2. Aortic  atherosclerosis: is stable (ct 10-15-20) will monitor  3. Benign hypertension with CKD stage III: is stable b/p 148/80 will continue losartan 50 mg daily; hctz 25 mg daily asa 81 mg daily   4. Controlled type 2 diabetes mellitus with stage 3 chronic kidney disease with long term current use of insulin: is stable hgb a1c 7.0 will continue actos 45 mg daily; onglyza 2.5 mg daily; tresiba 18 units nightly   5. Prostate cancer: is in remission is status post radiation seeds in 2020.   6. CKD stage 3 due to diabetes mellitus: is stable bun 45; creat 1.71 GFR 41   7. Obesity (BMI 30-39) BMI is 35.25 will monitor  8. Hyperlipidemia associated with type 2 diabetes: is stable will continue zocor 20 mg daily fish oil twice daily   9. GERD without esophagitis: is stable prilosec 40 mg twice daily and pepcid 40 mg daily   10. BPH without urinary obstruction: is stable will continue flomax 04 mg twice daily ans ditropan xl 5 mg twice daily   11. Thrombocytopenia: is stable plt 168; as low as 126 during hospitalization   12. Anemia with chronic disease: is stable hgb 10.8   13. Protein calorie malnutrition severe: albumin 2.1 will begin prostat 30 mL three times daily  Will check CMP and CBC    Ok Edwards NP [facility]  Contact [phone] Monday through Friday 8am- 5pm  After hours call [phone]

## 2020-10-22 ENCOUNTER — Ambulatory Visit (HOSPITAL_COMMUNITY)
Admission: RE | Admit: 2020-10-22 | Discharge: 2020-10-22 | Disposition: A | Discharge status: Home / Self Care | Payer: Medicare PPO | Source: Ambulatory Visit | Attending: Gastroenterology | Admitting: Gastroenterology

## 2020-10-22 DIAGNOSIS — K82A1 Gangrene of gallbladder in cholecystitis: Secondary | ICD-10-CM | POA: Diagnosis not present

## 2020-10-22 DIAGNOSIS — R1011 Right upper quadrant pain: Secondary | ICD-10-CM | POA: Insufficient documentation

## 2020-10-22 LAB — CULTURE, BLOOD (ROUTINE X 2)
Culture: NO GROWTH
Culture: NO GROWTH
Special Requests: ADEQUATE
Special Requests: ADEQUATE

## 2020-10-23 ENCOUNTER — Telehealth (INDEPENDENT_AMBULATORY_CARE_PROVIDER_SITE_OTHER): Payer: Medicare PPO | Admitting: General Surgery

## 2020-10-23 ENCOUNTER — Other Ambulatory Visit: Payer: Self-pay | Admitting: Adult Health

## 2020-10-23 ENCOUNTER — Inpatient Hospital Stay (HOSPITAL_COMMUNITY)
Admission: EM | Admit: 2020-10-23 | Discharge: 2020-11-05 | DRG: 871 | Disposition: A | Discharge status: Home Health | Payer: Medicare PPO | Attending: Internal Medicine | Admitting: Internal Medicine

## 2020-10-23 ENCOUNTER — Inpatient Hospital Stay (HOSPITAL_COMMUNITY): Payer: Medicare PPO

## 2020-10-23 ENCOUNTER — Encounter (HOSPITAL_COMMUNITY)
Admission: RE | Admit: 2020-10-23 | Discharge: 2020-10-23 | Disposition: A | Discharge status: Home / Self Care | Payer: Medicare PPO | Source: Ambulatory Visit | Attending: Gastroenterology | Admitting: Gastroenterology

## 2020-10-23 DIAGNOSIS — K750 Abscess of liver: Secondary | ICD-10-CM

## 2020-10-23 DIAGNOSIS — E1121 Type 2 diabetes mellitus with diabetic nephropathy: Secondary | ICD-10-CM | POA: Diagnosis present

## 2020-10-23 DIAGNOSIS — Z9889 Other specified postprocedural states: Secondary | ICD-10-CM

## 2020-10-23 DIAGNOSIS — K651 Peritoneal abscess: Secondary | ICD-10-CM | POA: Diagnosis not present

## 2020-10-23 DIAGNOSIS — N1832 Chronic kidney disease, stage 3b: Secondary | ICD-10-CM | POA: Diagnosis present

## 2020-10-23 DIAGNOSIS — K810 Acute cholecystitis: Secondary | ICD-10-CM | POA: Diagnosis present

## 2020-10-23 DIAGNOSIS — Z833 Family history of diabetes mellitus: Secondary | ICD-10-CM

## 2020-10-23 DIAGNOSIS — I129 Hypertensive chronic kidney disease with stage 1 through stage 4 chronic kidney disease, or unspecified chronic kidney disease: Secondary | ICD-10-CM | POA: Diagnosis not present

## 2020-10-23 DIAGNOSIS — K819 Cholecystitis, unspecified: Secondary | ICD-10-CM

## 2020-10-23 DIAGNOSIS — K82A1 Gangrene of gallbladder in cholecystitis: Secondary | ICD-10-CM | POA: Diagnosis not present

## 2020-10-23 DIAGNOSIS — R6520 Severe sepsis without septic shock: Secondary | ICD-10-CM | POA: Diagnosis present

## 2020-10-23 DIAGNOSIS — E669 Obesity, unspecified: Secondary | ICD-10-CM | POA: Diagnosis present

## 2020-10-23 DIAGNOSIS — L8990 Pressure ulcer of unspecified site, unspecified stage: Secondary | ICD-10-CM | POA: Insufficient documentation

## 2020-10-23 DIAGNOSIS — Z934 Other artificial openings of gastrointestinal tract status: Secondary | ICD-10-CM | POA: Diagnosis not present

## 2020-10-23 DIAGNOSIS — D631 Anemia in chronic kidney disease: Secondary | ICD-10-CM | POA: Diagnosis present

## 2020-10-23 DIAGNOSIS — J9811 Atelectasis: Secondary | ICD-10-CM | POA: Diagnosis not present

## 2020-10-23 DIAGNOSIS — K812 Acute cholecystitis with chronic cholecystitis: Secondary | ICD-10-CM | POA: Diagnosis not present

## 2020-10-23 DIAGNOSIS — Z20822 Contact with and (suspected) exposure to covid-19: Secondary | ICD-10-CM | POA: Diagnosis present

## 2020-10-23 DIAGNOSIS — L89322 Pressure ulcer of left buttock, stage 2: Secondary | ICD-10-CM | POA: Diagnosis present

## 2020-10-23 DIAGNOSIS — Z978 Presence of other specified devices: Secondary | ICD-10-CM | POA: Diagnosis not present

## 2020-10-23 DIAGNOSIS — E1165 Type 2 diabetes mellitus with hyperglycemia: Secondary | ICD-10-CM | POA: Diagnosis present

## 2020-10-23 DIAGNOSIS — E785 Hyperlipidemia, unspecified: Secondary | ICD-10-CM | POA: Diagnosis present

## 2020-10-23 DIAGNOSIS — Z8249 Family history of ischemic heart disease and other diseases of the circulatory system: Secondary | ICD-10-CM | POA: Diagnosis not present

## 2020-10-23 DIAGNOSIS — Z0389 Encounter for observation for other suspected diseases and conditions ruled out: Secondary | ICD-10-CM | POA: Diagnosis not present

## 2020-10-23 DIAGNOSIS — I10 Essential (primary) hypertension: Secondary | ICD-10-CM | POA: Diagnosis not present

## 2020-10-23 DIAGNOSIS — E119 Type 2 diabetes mellitus without complications: Secondary | ICD-10-CM | POA: Diagnosis not present

## 2020-10-23 DIAGNOSIS — N400 Enlarged prostate without lower urinary tract symptoms: Secondary | ICD-10-CM | POA: Diagnosis present

## 2020-10-23 DIAGNOSIS — N189 Chronic kidney disease, unspecified: Secondary | ICD-10-CM | POA: Diagnosis not present

## 2020-10-23 DIAGNOSIS — K219 Gastro-esophageal reflux disease without esophagitis: Secondary | ICD-10-CM | POA: Diagnosis present

## 2020-10-23 DIAGNOSIS — E1122 Type 2 diabetes mellitus with diabetic chronic kidney disease: Secondary | ICD-10-CM | POA: Diagnosis present

## 2020-10-23 DIAGNOSIS — D649 Anemia, unspecified: Secondary | ICD-10-CM | POA: Diagnosis not present

## 2020-10-23 DIAGNOSIS — K828 Other specified diseases of gallbladder: Secondary | ICD-10-CM | POA: Diagnosis not present

## 2020-10-23 DIAGNOSIS — N170 Acute kidney failure with tubular necrosis: Secondary | ICD-10-CM | POA: Diagnosis present

## 2020-10-23 DIAGNOSIS — K769 Liver disease, unspecified: Secondary | ICD-10-CM | POA: Diagnosis not present

## 2020-10-23 DIAGNOSIS — N183 Chronic kidney disease, stage 3 unspecified: Secondary | ICD-10-CM | POA: Diagnosis present

## 2020-10-23 DIAGNOSIS — J90 Pleural effusion, not elsewhere classified: Secondary | ICD-10-CM | POA: Diagnosis present

## 2020-10-23 DIAGNOSIS — E871 Hypo-osmolality and hyponatremia: Secondary | ICD-10-CM | POA: Diagnosis present

## 2020-10-23 DIAGNOSIS — Z79899 Other long term (current) drug therapy: Secondary | ICD-10-CM

## 2020-10-23 DIAGNOSIS — R1011 Right upper quadrant pain: Secondary | ICD-10-CM | POA: Insufficient documentation

## 2020-10-23 DIAGNOSIS — D696 Thrombocytopenia, unspecified: Secondary | ICD-10-CM | POA: Diagnosis present

## 2020-10-23 DIAGNOSIS — A419 Sepsis, unspecified organism: Secondary | ICD-10-CM | POA: Diagnosis not present

## 2020-10-23 DIAGNOSIS — Z841 Family history of disorders of kidney and ureter: Secondary | ICD-10-CM

## 2020-10-23 DIAGNOSIS — IMO0002 Reserved for concepts with insufficient information to code with codable children: Secondary | ICD-10-CM | POA: Diagnosis present

## 2020-10-23 DIAGNOSIS — N3289 Other specified disorders of bladder: Secondary | ICD-10-CM | POA: Diagnosis not present

## 2020-10-23 DIAGNOSIS — N1831 Chronic kidney disease, stage 3a: Secondary | ICD-10-CM | POA: Diagnosis not present

## 2020-10-23 DIAGNOSIS — C61 Malignant neoplasm of prostate: Secondary | ICD-10-CM | POA: Diagnosis present

## 2020-10-23 DIAGNOSIS — E876 Hypokalemia: Secondary | ICD-10-CM | POA: Diagnosis not present

## 2020-10-23 DIAGNOSIS — R609 Edema, unspecified: Secondary | ICD-10-CM | POA: Diagnosis not present

## 2020-10-23 DIAGNOSIS — Z872 Personal history of diseases of the skin and subcutaneous tissue: Secondary | ICD-10-CM | POA: Diagnosis not present

## 2020-10-23 DIAGNOSIS — T501X5A Adverse effect of loop [high-ceiling] diuretics, initial encounter: Secondary | ICD-10-CM | POA: Diagnosis not present

## 2020-10-23 DIAGNOSIS — Z6835 Body mass index (BMI) 35.0-35.9, adult: Secondary | ICD-10-CM

## 2020-10-23 DIAGNOSIS — N141 Nephropathy induced by other drugs, medicaments and biological substances: Secondary | ICD-10-CM | POA: Diagnosis not present

## 2020-10-23 DIAGNOSIS — Z7982 Long term (current) use of aspirin: Secondary | ICD-10-CM

## 2020-10-23 DIAGNOSIS — Z888 Allergy status to other drugs, medicaments and biological substances status: Secondary | ICD-10-CM

## 2020-10-23 DIAGNOSIS — Z794 Long term (current) use of insulin: Secondary | ICD-10-CM

## 2020-10-23 DIAGNOSIS — I517 Cardiomegaly: Secondary | ICD-10-CM | POA: Diagnosis not present

## 2020-10-23 DIAGNOSIS — Z4682 Encounter for fitting and adjustment of non-vascular catheter: Secondary | ICD-10-CM | POA: Diagnosis not present

## 2020-10-23 DIAGNOSIS — T508X5A Adverse effect of diagnostic agents, initial encounter: Secondary | ICD-10-CM | POA: Diagnosis not present

## 2020-10-23 DIAGNOSIS — L0291 Cutaneous abscess, unspecified: Secondary | ICD-10-CM

## 2020-10-23 LAB — COMPREHENSIVE METABOLIC PANEL
ALT: 38 U/L (ref 0–44)
AST: 31 U/L (ref 15–41)
Albumin: 1.9 g/dL — ABNORMAL LOW (ref 3.5–5.0)
Alkaline Phosphatase: 74 U/L (ref 38–126)
Anion gap: 7 (ref 5–15)
BUN: 31 mg/dL — ABNORMAL HIGH (ref 8–23)
CO2: 27 mmol/L (ref 22–32)
Calcium: 8.2 mg/dL — ABNORMAL LOW (ref 8.9–10.3)
Chloride: 99 mmol/L (ref 98–111)
Creatinine, Ser: 1.67 mg/dL — ABNORMAL HIGH (ref 0.61–1.24)
GFR, Estimated: 43 mL/min — ABNORMAL LOW (ref >60)
Glucose, Bld: 193 mg/dL — ABNORMAL HIGH (ref 70–99)
Potassium: 3.8 mmol/L (ref 3.5–5.1)
Sodium: 133 mmol/L — ABNORMAL LOW (ref 135–145)
Total Bilirubin: 0.7 mg/dL (ref 0.3–1.2)
Total Protein: 5.4 g/dL — ABNORMAL LOW (ref 6.5–8.1)

## 2020-10-23 LAB — CBC WITH DIFFERENTIAL/PLATELET
Abs Immature Granulocytes: 0.67 10*3/uL — ABNORMAL HIGH (ref 0.00–0.07)
Basophils Absolute: 0.1 10*3/uL (ref 0.0–0.1)
Basophils Relative: 0 %
Eosinophils Absolute: 0.2 10*3/uL (ref 0.0–0.5)
Eosinophils Relative: 1 %
HCT: 31.2 % — ABNORMAL LOW (ref 39.0–52.0)
Hemoglobin: 10.3 g/dL — ABNORMAL LOW (ref 13.0–17.0)
Immature Granulocytes: 4 %
Lymphocytes Relative: 4 %
Lymphs Abs: 0.6 10*3/uL — ABNORMAL LOW (ref 0.7–4.0)
MCH: 32.3 pg (ref 26.0–34.0)
MCHC: 33 g/dL (ref 30.0–36.0)
MCV: 97.8 fL (ref 80.0–100.0)
Monocytes Absolute: 0.9 10*3/uL (ref 0.1–1.0)
Monocytes Relative: 5 %
Neutro Abs: 15.5 10*3/uL — ABNORMAL HIGH (ref 1.7–7.7)
Neutrophils Relative %: 86 %
Platelets: 242 10*3/uL (ref 150–400)
RBC: 3.19 MIL/uL — ABNORMAL LOW (ref 4.22–5.81)
RDW: 13.6 % (ref 11.5–15.5)
WBC: 18 10*3/uL — ABNORMAL HIGH (ref 4.0–10.5)
nRBC: 0 % (ref 0.0–0.2)

## 2020-10-23 LAB — CBG MONITORING, ED: Glucose-Capillary: 157 mg/dL — ABNORMAL HIGH (ref 70–99)

## 2020-10-23 LAB — RESP PANEL BY RT-PCR (FLU A&B, COVID) ARPGX2
Influenza A by PCR: NEGATIVE
Influenza B by PCR: NEGATIVE
SARS Coronavirus 2 by RT PCR: NEGATIVE

## 2020-10-23 LAB — HEMOGLOBIN A1C
Hgb A1c MFr Bld: 7.6 % — ABNORMAL HIGH (ref 4.8–5.6)
Mean Plasma Glucose: 171.42 mg/dL

## 2020-10-23 LAB — GLUCOSE, CAPILLARY: Glucose-Capillary: 190 mg/dL — ABNORMAL HIGH (ref 70–99)

## 2020-10-23 MED ORDER — TAMSULOSIN HCL 0.4 MG PO CAPS
0.4000 mg | ORAL_CAPSULE | Freq: Two times a day (BID) | ORAL | Status: DC
  Administered 2020-10-24 – 2020-11-05 (×25): 0.4 mg via ORAL
  Filled 2020-10-23 (×25): qty 1

## 2020-10-23 MED ORDER — INSULIN DEGLUDEC 100 UNIT/ML ~~LOC~~ SOPN: 18.0000 [IU] | PEN_INJECTOR | Freq: Every day | SUBCUTANEOUS | Status: DC

## 2020-10-23 MED ORDER — ASPIRIN EC 81 MG PO TBEC
81.0000 mg | DELAYED_RELEASE_TABLET | Freq: Every day | ORAL | Status: DC
  Administered 2020-10-25 – 2020-11-05 (×12): 81 mg via ORAL
  Filled 2020-10-23 (×12): qty 1

## 2020-10-23 MED ORDER — SODIUM CHLORIDE 0.9 % IV SOLN: INTRAVENOUS | Status: AC

## 2020-10-23 MED ORDER — OXYBUTYNIN CHLORIDE 5 MG PO TABS
5.0000 mg | ORAL_TABLET | Freq: Two times a day (BID) | ORAL | Status: DC
  Administered 2020-10-23 – 2020-11-05 (×26): 5 mg via ORAL
  Filled 2020-10-23 (×28): qty 1

## 2020-10-23 MED ORDER — LINAGLIPTIN 5 MG PO TABS
5.0000 mg | ORAL_TABLET | Freq: Every day | ORAL | Status: DC
  Administered 2020-10-24: 5 mg via ORAL
  Filled 2020-10-23 (×2): qty 1

## 2020-10-23 MED ORDER — ENOXAPARIN SODIUM 40 MG/0.4ML IJ SOSY
40.0000 mg | PREFILLED_SYRINGE | INTRAMUSCULAR | Status: DC
  Administered 2020-10-25 – 2020-10-29 (×5): 40 mg via SUBCUTANEOUS
  Filled 2020-10-23 (×5): qty 0.4

## 2020-10-23 MED ORDER — PANTOPRAZOLE SODIUM 40 MG PO TBEC
40.0000 mg | DELAYED_RELEASE_TABLET | Freq: Every day | ORAL | Status: DC
  Administered 2020-10-24 – 2020-11-05 (×13): 40 mg via ORAL
  Filled 2020-10-23 (×13): qty 1

## 2020-10-23 MED ORDER — MORPHINE SULFATE (PF) 4 MG/ML IV SOLN
INTRAVENOUS | Status: AC
  Administered 2020-10-23: 3 mg via INTRAVENOUS
  Filled 2020-10-23: qty 1

## 2020-10-23 MED ORDER — TRAMADOL HCL 50 MG PO TABS
50.0000 mg | ORAL_TABLET | Freq: Two times a day (BID) | ORAL | Status: DC | PRN
  Administered 2020-10-24 – 2020-10-28 (×4): 50 mg via ORAL
  Filled 2020-10-23 (×5): qty 1

## 2020-10-23 MED ORDER — HYDRALAZINE HCL 25 MG PO TABS
25.0000 mg | ORAL_TABLET | Freq: Four times a day (QID) | ORAL | Status: DC | PRN
  Administered 2020-10-23 – 2020-10-31 (×4): 25 mg via ORAL
  Filled 2020-10-23 (×5): qty 1

## 2020-10-23 MED ORDER — PIPERACILLIN-TAZOBACTAM 3.375 G IVPB 30 MIN
3.3750 g | Freq: Once | INTRAVENOUS | Status: AC
  Administered 2020-10-23: 3.375 g via INTRAVENOUS
  Filled 2020-10-23: qty 50

## 2020-10-23 MED ORDER — ONDANSETRON HCL 4 MG/2ML IJ SOLN: 4.0000 mg | Freq: Four times a day (QID) | INTRAMUSCULAR | Status: DC | PRN

## 2020-10-23 MED ORDER — FLUOROURACIL 5 % EX CREA: TOPICAL_CREAM | Freq: Two times a day (BID) | CUTANEOUS | Status: DC

## 2020-10-23 MED ORDER — ONDANSETRON HCL 4 MG PO TABS: 4.0000 mg | ORAL_TABLET | Freq: Four times a day (QID) | ORAL | Status: DC | PRN

## 2020-10-23 MED ORDER — PIOGLITAZONE HCL 30 MG PO TABS
45.0000 mg | ORAL_TABLET | Freq: Every day | ORAL | Status: DC
  Administered 2020-10-24: 45 mg via ORAL
  Filled 2020-10-23 (×2): qty 1

## 2020-10-23 MED ORDER — HYDROCHLOROTHIAZIDE 25 MG PO TABS
25.0000 mg | ORAL_TABLET | Freq: Every day | ORAL | Status: DC
  Administered 2020-10-24: 25 mg via ORAL
  Filled 2020-10-23: qty 1

## 2020-10-23 MED ORDER — MORPHINE SULFATE (PF) 4 MG/ML IV SOLN: 3.0000 mg | Freq: Once | INTRAVENOUS | Status: AC

## 2020-10-23 MED ORDER — FAMOTIDINE 20 MG PO TABS
40.0000 mg | ORAL_TABLET | Freq: Every day | ORAL | Status: DC
  Administered 2020-10-23 – 2020-10-31 (×9): 40 mg via ORAL
  Filled 2020-10-23 (×9): qty 2

## 2020-10-23 MED ORDER — SACCHAROMYCES BOULARDII 250 MG PO CAPS
250.0000 mg | ORAL_CAPSULE | Freq: Two times a day (BID) | ORAL | Status: DC
  Administered 2020-10-23 – 2020-11-05 (×26): 250 mg via ORAL
  Filled 2020-10-23 (×28): qty 1

## 2020-10-23 MED ORDER — INSULIN ASPART 100 UNIT/ML IJ SOLN
0.0000 [IU] | Freq: Three times a day (TID) | INTRAMUSCULAR | Status: DC
  Administered 2020-10-24: 3 [IU] via SUBCUTANEOUS
  Administered 2020-10-24: 2 [IU] via SUBCUTANEOUS
  Administered 2020-10-25 (×3): 3 [IU] via SUBCUTANEOUS
  Administered 2020-10-26: 5 [IU] via SUBCUTANEOUS
  Administered 2020-10-26: 3 [IU] via SUBCUTANEOUS
  Administered 2020-10-26: 5 [IU] via SUBCUTANEOUS
  Administered 2020-10-27: 3 [IU] via SUBCUTANEOUS
  Administered 2020-10-27: 5 [IU] via SUBCUTANEOUS
  Administered 2020-10-27 – 2020-10-28 (×2): 3 [IU] via SUBCUTANEOUS
  Administered 2020-10-28 (×2): 5 [IU] via SUBCUTANEOUS
  Administered 2020-10-29 – 2020-10-30 (×4): 3 [IU] via SUBCUTANEOUS
  Administered 2020-10-30: 2 [IU] via SUBCUTANEOUS
  Administered 2020-10-30 – 2020-11-02 (×9): 3 [IU] via SUBCUTANEOUS
  Administered 2020-11-02: 5 [IU] via SUBCUTANEOUS
  Administered 2020-11-03: 3 [IU] via SUBCUTANEOUS
  Administered 2020-11-03: 2 [IU] via SUBCUTANEOUS
  Administered 2020-11-03: 3 [IU] via SUBCUTANEOUS
  Administered 2020-11-04: 5 [IU] via SUBCUTANEOUS
  Administered 2020-11-04: 2 [IU] via SUBCUTANEOUS
  Administered 2020-11-04: 5 [IU] via SUBCUTANEOUS

## 2020-10-23 MED ORDER — INSULIN GLARGINE-YFGN 100 UNIT/ML ~~LOC~~ SOLN
15.0000 [IU] | Freq: Every day | SUBCUTANEOUS | Status: DC
  Administered 2020-10-23: 15 [IU] via SUBCUTANEOUS
  Filled 2020-10-23 (×2): qty 0.15

## 2020-10-23 MED ORDER — IOHEXOL 350 MG/ML SOLN
70.0000 mL | Freq: Once | INTRAVENOUS | Status: AC | PRN
  Administered 2020-10-23: 70 mL via INTRAVENOUS

## 2020-10-23 MED ORDER — SIMVASTATIN 20 MG PO TABS
20.0000 mg | ORAL_TABLET | Freq: Every day | ORAL | Status: DC
  Administered 2020-10-23 – 2020-11-04 (×13): 20 mg via ORAL
  Filled 2020-10-23 (×13): qty 1

## 2020-10-23 MED ORDER — LOSARTAN POTASSIUM 50 MG PO TABS
50.0000 mg | ORAL_TABLET | Freq: Every day | ORAL | Status: DC
  Administered 2020-10-24: 50 mg via ORAL
  Filled 2020-10-23: qty 1

## 2020-10-23 MED ORDER — HYDROMORPHONE HCL 1 MG/ML IJ SOLN
0.5000 mg | INTRAMUSCULAR | Status: DC | PRN
  Administered 2020-10-24 – 2020-10-25 (×4): 1 mg via INTRAVENOUS
  Filled 2020-10-23 (×4): qty 1

## 2020-10-23 MED ORDER — PIPERACILLIN-TAZOBACTAM 3.375 G IVPB
3.3750 g | Freq: Three times a day (TID) | INTRAVENOUS | Status: DC
  Administered 2020-10-23 – 2020-10-30 (×20): 3.375 g via INTRAVENOUS
  Filled 2020-10-23 (×20): qty 50

## 2020-10-23 MED ORDER — TECHNETIUM TC 99M MEBROFENIN IV KIT
5.3000 | PACK | Freq: Once | INTRAVENOUS | Status: AC | PRN
  Administered 2020-10-23: 5.3 via INTRAVENOUS

## 2020-10-23 NOTE — Progress Notes (Addendum)
New admission in 6N 17 due to continuation of care. Patient is AXOX4. He is on room air. Wife is at bedside.

## 2020-10-23 NOTE — H&P (Signed)
History and Physical    James Glover [id number] DOB: May 19, 1945 DOA: 10/23/2020  PCP: [name], MD (Confirm with patient/family/NH records and if not entered, this has to be entered at [facility] point of entry) Patient coming from: Rehab  I have personally briefly reviewed patient's old medical records in [place]  Chief Complaint: I was sent by my surgeon here  HPI: James Glover is a 75 y.o. male with medical history significant of recently diagnosed acalculous cholecystitis with subcapsular liver abscess and strep gallolyticus bacteremia, HTN, IDDM, HLD, invasive prostate cancer status post brachytherapy in 2020 now in remission, CKD stage III, was sent by his surgeon to evaluate for worsening of cholecystitis and liver abscess.  Patient was hospitalized last week for sepsis secondary to a calculus cholecystitis, liver abscess and bacteremia.  Imaging studies showed a calculus cholecystitis with liver abscess 4 x 1.7 cm and strep gallolyticus bacteremia.  Patient was evaluated surgeon, infectious disease, plan was for patient to receive 4 weeks of antibiotic treatment.  Initially patient condition improved sepsis resolved after Zosyn treatment per treatment downgrade to Unasyn, and patient was discharged on Augmentin 4 days ago on 8/22 to rehab.  Patient reports that he had sat occasionally breakthrough dull-like RUQ pain, no more fever chills no diarrhea.  And his appetite has resumed.  Today he underwent a follow-up RUQ ultrasound and HIDA scan, which concerns for gangrenous cholecystitis and enlarged liver abscess and his surgeon recommend patient come to ED and evaluate by IR for cholecystostomy tube and liver abscess drainage.  ED Course: Patient is afebrile, blood pressure within normal limits, no tachycardia.  Blood work showed WBC 18.0, creatinine 1.6, AST ALT bilirubin within normal limits. RUQ ultrasound yesterday showed gangrenous cholecystitis, with no multiple smaller  liver abscess within gallbladder fossa, and enlargement of the right decongested liver abscess as of last admission.  Review of Systems: As per HPI otherwise 14 point review of systems negative.    Past Medical History:  Diagnosis Date   CKD (chronic kidney disease), stage III [facility])    nephrologist-- [place]--- per lov note 03-08-2018 in epic, stable (baseline Cr 1.5 - 2)   Dyslipidemia    Erectile dysfunction    First degree heart block    GERD (gastroesophageal reflux disease)    Hemorrhoids    internal and external   History of nuclear stress test    05-26-2009 (by dr Rollene Fare due to HTN and DM2)--- normal study w/ no ischemia,  normal LV function and wall motion , ef 70%   Hyperplasia of prostate with lower urinary tract symptoms (LUTS)    Hypertension    Hypogonadism male    Iron deficiency    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 Tresa Moore   Prostate cancer [facility]) urologist-  dr t. Tresa Moore  oncologsit-  dr Tammi Klippel   dx 05-30-2018-- Stage T1c, Gleason 4+5, High Risk   Renal cyst, left    followed by dr t. Tresa Moore--  chronic noncomplex   Type 2 diabetes mellitus ([place])    followed by pcp   Wears partial dentures    upper    Past Surgical History:  Procedure Laterality Date   CIRCUMCISION  age 81   CYSTOSCOPY N/A 11/03/2018   Procedure: CYSTOSCOPY FLEXIBLE;  Surgeon: Alexis Frock, MD;  Location: [facility];  Service: Urology;  Laterality: N/A;  NO SEEDS FOUND IN BLADDER   KNEE ARTHROSCOPY Right 11/14/2017  dr Stann Mainland '@[facility]'$    RADIOACTIVE SEED IMPLANT N/A 11/03/2018   Procedure: RADIOACTIVE SEED IMPLANT/BRACHYTHERAPY IMPLANT;  Surgeon: Alexis Frock, MD;  Location: [facility];  Service: Urology;  Laterality: N/A;       SEEDS IMPLANTED   SPACE OAR INSTILLATION N/A 11/03/2018   Procedure: SPACE OAR INSTILLATION;  Surgeon: Alexis Frock, MD;  Location: [facility];  Service: Urology;  Laterality: N/A;      reports that he has never smoked. He has never used smokeless tobacco. He reports current alcohol use of about 3.0 standard drinks per week. He reports that he does not use drugs.  Allergies  Allergen Reactions   Ace Inhibitors Cough   Statins Cough    Family History  Problem Relation Age of Onset   Lymphoma Father    Cancer Father        lymphoma   Hypertension Father    Lymphoma Brother    Cancer Brother        lymphoma   Dementia Mother    Diabetes Mother    Diabetes Sister    Cancer Brother        skin cancer   Diabetes Brother    Lymphoma Paternal Grandfather    Cancer Paternal Grandfather        lymphoma   Diabetes Brother    Kidney disease Brother    COPD Brother    Heart disease Neg Hx      Prior to Admission medications   Medication Sig Start Date End Date Taking? Authorizing Provider  traMADol (ULTRAM) 50 MG tablet Take 50 mg by mouth in the morning and at bedtime. 10/15/20  Yes [provider]  ACCU-CHEK GUIDE test strip USE AS DIRECTED 07/17/20   [name], MD  Accu-Chek Softclix Lancets lancets 1 each by Other route 2 (two) times daily. Use as instructed 03/29/19   [name], MD  amoxicillin-clavulanate (AUGMENTIN) 875-125 MG tablet Take 1 tablet by mouth every 12 (twelve) hours for 25 days. 10/20/20 11/14/20  Patrecia Pour, MD  aspirin 81 MG tablet Take 81 mg by mouth daily.    [provider]  famotidine (PEPCID) 40 MG tablet Take 40 mg by mouth at bedtime.    [provider]  fluorouracil (EFUDEX) 5 % cream  07/11/19   [provider]  hydrochlorothiazide (HYDRODIURIL) 25 MG tablet Take 1 tablet (25 mg total) by mouth daily. TAKE 1 TABLET(25 MG) BY MOUTH DAILY Patient taking differently: Take 25 mg by mouth daily. 08/21/20   [name], MD  insulin degludec (TRESIBA FLEXTOUCH) 100 UNIT/ML FlexTouch Pen ADMINISTER 18 UNITS UNDER THE SKIN AT BEDTIME Patient taking differently: Inject 18 Units into the skin at bedtime.  08/21/20   [name], MD  Insulin Pen Needle (BD PEN NEEDLE NANO U/F) 32G X 4 MM MISC 1 each by Does not apply route as needed. 08/27/20   [name], MD  losartan (COZAAR) 50 MG tablet TAKE 1 TABLET BY MOUTH DAILY Patient taking differently: Take 50 mg by mouth daily. 08/21/20   [name], MD  Multiple Vitamins-Minerals (MULTIVITAMIN WITH MINERALS) tablet Take 1 tablet by mouth daily.    [provider]  Omega-3 Fatty Acids (FISH OIL) 1000 MG CAPS Take 1,000 mg by mouth 2 (two) times daily.    [provider]  omeprazole (PRILOSEC) 40 MG capsule TAKE 1 CAPSULE(40 MG) BY MOUTH IN THE MORNING AND AT BEDTIME Patient taking differently: Take 40 mg by mouth in the  morning and at bedtime. 09/05/20   [name], MD  oxybutynin (DITROPAN) 5 MG tablet Take 5 mg by mouth 2 (two) times daily.  07/30/19   [provider]  pioglitazone (ACTOS) 45 MG tablet Take 1 tablet (45 mg total) by mouth daily. 08/21/20   [name], MD  saccharomyces boulardii (FLORASTOR) 250 MG capsule Take 1 capsule (250 mg total) by mouth 2 (two) times daily for 25 days. 10/20/20 11/14/20  Patrecia Pour, MD  saxagliptin HCl (ONGLYZA) 2.5 MG TABS tablet TAKE 1 TABLET(2.5 MG) BY MOUTH DAILY Patient taking differently: Take 2.5 mg by mouth daily. 08/21/20   [name], MD  simvastatin (ZOCOR) 20 MG tablet TAKE 1 TABLET(20 MG) BY MOUTH AT BEDTIME Patient taking differently: Take 20 mg by mouth at bedtime. 08/21/20   [name], MD  tamsulosin (FLOMAX) 0.4 MG CAPS capsule Take 1 capsule (0.4 mg total) by mouth 2 (two) times daily after a meal. For urinary urgency or weak stream. 02/08/19   Bruning, Ashlyn, PA-C  valACYclovir (VALTREX) 1000 MG tablet Take 2,000 mg by mouth as needed (fever blister). 10/08/14   [provider]    Physical Exam: Vitals:   10/23/20 1103  BP: (!) 143/50  Pulse: 81  Resp: 16  Temp: 98.9 F (37.2 C)  SpO2: 95%    Constitutional: NAD, calm, comfortable Vitals:   10/23/20 1103   BP: (!) 143/50  Pulse: 81  Resp: 16  Temp: 98.9 F (37.2 C)  SpO2: 95%   Eyes: PERRL, lids and conjunctivae normal ENMT: Mucous membranes are moist. Posterior pharynx clear of any exudate or lesions.Normal dentition.  Neck: normal, supple, no masses, no thyromegaly Respiratory: clear to auscultation bilaterally, no wheezing, no crackles. Normal respiratory effort. No accessory muscle use.  Cardiovascular: Regular rate and rhythm, no murmurs / rubs / gallops. No extremity edema. 2+ pedal pulses. No carotid bruits.  Abdomen: mild tenderness on RUQ area, no rebound, no guarding, no masses palpated. No hepatosplenomegaly. Bowel sounds positive.  Musculoskeletal: no clubbing / cyanosis. No joint deformity upper and lower extremities. Good ROM, no contractures. Normal muscle tone.  Skin: no rashes, lesions, ulcers. No induration Neurologic: CN 2-12 grossly intact. Sensation intact, DTR normal. Strength 5/5 in all 4.  Psychiatric: Normal judgment and insight. Alert and oriented x 3. Normal mood.     Labs on Admission: I have personally reviewed following labs and imaging studies  CBC: Recent Labs  Lab 10/17/20 0708 10/19/20 0554 10/20/20 0633 10/23/20 1122  WBC 15.1* 18.2* 17.4* 18.0*  NEUTROABS  --  16.0* 14.6* 15.5*  HGB 11.3* 12.5* 10.8* 10.3*  HCT 34.1* 36.7* 31.9* 31.2*  MCV 98.6 96.1 96.7 97.8  PLT 124* 148* 160 [id number]   Basic Metabolic Panel: Recent Labs  Lab 10/17/20 0708 10/18/20 0747 10/19/20 0554 10/20/20 0633 10/23/20 1122  NA 133* 132* 130* 132* 133*  K 3.5 3.4* 3.3* 3.7 3.8  CL 101 97* 98 100 99  CO2 '25 24 23 25 27  '$ GLUCOSE 119* 156* 154* 180* 193*  BUN 55* 59* 52* 45* 31*  CREATININE 1.99* 2.08* 1.84* 1.71* 1.67*  CALCIUM 7.8* 8.0* 7.7* 7.6* 8.2*   GFR: Estimated Creatinine Clearance: 55.9 mL/min (A) (by C-G formula based on SCr of 1.67 mg/dL (H)). Liver Function Tests: Recent Labs  Lab 10/17/20 0708 10/19/20 0554 10/23/20 1122  AST 52* 36 31  ALT  76* 52* 38  ALKPHOS 52 74 74  BILITOT 0.8 1.0 0.7  PROT 5.8* 5.7* 5.4*  ALBUMIN 2.3* 2.1* 1.9*   No results for input(s): LIPASE, AMYLASE in the last 168 hours. No results for input(s): AMMONIA in the last 168 hours. Coagulation Profile: No results for input(s): INR, PROTIME in the last 168 hours. Cardiac Enzymes: No results for input(s): CKTOTAL, CKMB, CKMBINDEX, TROPONINI in the last 168 hours. BNP (last 3 results) No results for input(s): PROBNP in the last 8760 hours. HbA1C: No results for input(s): HGBA1C in the last 72 hours. CBG: Recent Labs  Lab 10/19/20 1115 10/19/20 1656 10/19/20 2100 10/20/20 0803 10/20/20 1126  GLUCAP 190* 180* 179* 176* 219*   Lipid Profile: No results for input(s): CHOL, HDL, LDLCALC, TRIG, CHOLHDL, LDLDIRECT in the last 72 hours. Thyroid Function Tests: No results for input(s): TSH, T4TOTAL, FREET4, T3FREE, THYROIDAB in the last 72 hours. Anemia Panel: No results for input(s): VITAMINB12, FOLATE, FERRITIN, TIBC, IRON, RETICCTPCT in the last 72 hours. Urine analysis:    Component Value Date/Time   COLORURINE AMBER (A) 10/16/2020 0036   APPEARANCEUR CLOUDY (A) 10/16/2020 0036   LABSPEC 1.041 (H) 10/16/2020 0036   LABSPEC 1.020 12/27/2018 1315   PHURINE 5.0 10/16/2020 0036   GLUCOSEU 50 (A) 10/16/2020 0036   HGBUR SMALL (A) 10/16/2020 0036   BILIRUBINUR NEGATIVE 10/16/2020 0036   BILIRUBINUR negative 12/27/2018 1315   BILIRUBINUR neg 06/23/2016 1050   KETONESUR 5 (A) 10/16/2020 0036   PROTEINUR 100 (A) 10/16/2020 0036   UROBILINOGEN negative (A) 06/23/2016 1050   NITRITE NEGATIVE 10/16/2020 0036   LEUKOCYTESUR NEGATIVE 10/16/2020 0036    Radiological Exams on Admission: NM Hepato W/EF  Result Date: 10/23/2020 CLINICAL DATA:  Concern for cholecystitis. EXAM: NUCLEAR MEDICINE HEPATOBILIARY IMAGING TECHNIQUE: Sequential images of the abdomen were obtained out to 60 minutes following intravenous administration of radiopharmaceutical.  RADIOPHARMACEUTICALS:  5.3 mCi Tc-30m Choletec IV COMPARISON:  CT scan 10/15/2020 FINDINGS: Prompt clearance radiotracer from blood pool and homogeneous uptake in liver. Counts are evident in the common bile duct by 20 minutes. Counts continue to fill the small bowel. The gallbladder is not identified at 60 minutes. 3 mg of IV morphine was administered to augment filling of the gallbladder. No filling of the gallbladder following morphine administration. IMPRESSION: Non filling of the gallbladder is consistent with acute cholecystitis. These results will be called to the ordering clinician or representative by the Radiologist Assistant, and communication documented in the PACS or [employer] Electronically Signed   By: SSuzy BouchardM.D.   On: 10/23/2020 13:11   UKoreaAbdomen Limited RUQ (LIVER/GB)  Result Date: 10/22/2020 CLINICAL DATA:  Right upper quadrant abdominal pain EXAM: ULTRASOUND ABDOMEN LIMITED RIGHT UPPER QUADRANT COMPARISON:  10/16/2020, CT 10/15/2020 FINDINGS: Gallbladder: The gallbladder is mildly distended, similar to prior examination. There is mild gallbladder wall thickening and pericholecystic fluid again identified. Multiple pericholecystic fluid collections are identified within the adjacent gallbladder fossa. When compared to prior imaging, altogether, the findings are in keeping with gangrenous cholecystitis. Common bile duct: Diameter: 8 mm in proximal diameter. This is stable when compared to prior CT examination. Liver: As noted above, several small cystic lesions are identified within the gallbladder fossa adjacent to the gallbladder itself most in keeping with small hepatic abscesses in the setting of gangrenous cholecystitis. No other focal intrahepatic masses are identified. No intrahepatic biliary ductal dilation. Hepatic parenchymal echogenicity is within normal limits. There is a subcapsular simple appearing fluid collection within the anterior right hepatic lobe  measuring 3.3 x 2.3 x 3.6 cm. A a ovoid subcapsular complex fluid  collection is seen subjacent to the right hepatic lobe measuring 7.6 x 3.1 x 5.1 cm on image # [id number] most in keeping with a subhepatic abscess, enlarged in size since prior CT examination. Portal vein is patent on color Doppler imaging with normal direction of blood flow towards the liver. Other: Small right pleural effusion is present IMPRESSION: Findings is in keeping with a gangrenous cholecystitis, similar to prior examination. Multiple small probable hepatic abscesses within the gallbladder fossa. Enlarging complex fluid collection within the right subhepatic space likely reflecting a enlarging subhepatic abscess given the above findings. 3.6 cm subcapsular simple appearing fluid collection within the anterior right hepatic lobe, new since prior CT examination, of unclear significance. Small right pleural effusion. These results will be called to the ordering clinician or representative by the Radiologist Assistant, and communication documented in the PACS or [employer]. Electronically Signed   By: Fidela Salisbury M.D.   On: 10/22/2020 23:09    EKG: NOne  Assessment/Plan Active Problems:   Acute cholecystitis   Cholecystitis  (please populate well all problems here in Problem List. (For example, if patient is on BP meds at home and you resume or decide to hold them, it is a problem that needs to be her. Same for CAD, COPD, HLD and so on)   Acute/subacute calculus cholecystitis, gangrenous, failed outpatient antibiotic treatment -Escalate antibiotic treatment from p.o. Augmentin to IV Zosyn.  Consider reconsult infectious disease after IR guided procedure and culture tomorrow.  As per surgery's recommendation, IR consulted for cholecystostomy +/- liver abscess drainage tomorrow.  N.p.o. after midnight. -As per infectious disease recommendation last admission, discussed with family regarding colonoscopy as outpatient rule out  colon cancer, given the blood culture positive for strep bovis on admission.  Patient reported that he just had colonoscopy couple months ago.  In this case, I recommend patient talk to his GI specialist as outpatient to review colonoscopy results.  Patient and his family expressed understanding and agreed.  IDDM -Continue long-acting insulin, p.o. meds, add sliding scale.  CKD stage IIIa -Euvolemic, creatinine level stable, continue ACEI.  Chronic hyponatremia -Na level stable, likely secondary to HCTZ.  HLD -Continue PPI  History of invasive prostate CA status post brachytherapy -In remission, outpatient follow-up with urology.  Deconditioning -PT evaluation  DVT prophylaxis: Lovenox Code Status: Full code Family Communication: Wife at bedside Disposition Plan: Expect more than 2 midnight hospital stay for IR procedure and culture to guide long-term antibiotic treatment. Consults called: IR Admission status: MedSurg admission   Lequita Halt MD [facility] Pager [phone]  10/23/2020, 1:18 PM

## 2020-10-23 NOTE — Telephone Encounter (Signed)
[facility]  Office called by [facility] NP calling regarding patient. Known cholecystitis and liver abscess. He went to [facility] Monday.  Has had [place] yesterday that showed gangrenous cholecystitis and bigger abscess and HIDA today in [place] and he had worsening pain and being sick and having bad days.   Had discussed that he was improving with antibiotics and would try this during his stay but now is getting worse clinically and signs of worsening disease on the imaging.   He will need a cholecystostomy tube / IR drainage of the abscess. He is already in [place] now for the HIDA.  Discussed with the [facility] NP and leaders that he should stay in [facility] for either IR or ED admission and IR. They are going to let their team know.  Curlene Labrum, MD [facility] [address] [place], [place] [address] [phone] (office)

## 2020-10-23 NOTE — ED Triage Notes (Signed)
Patient sent to ED from nuclear medicine after HIDA scan and abdominal ultrasound were concerning for gangrenous cholecystitis. Patient states abdominal pain started two weeks ago.

## 2020-10-23 NOTE — Progress Notes (Signed)
Pharmacy Antibiotic Note  James Glover is a 75 y.o. male admitted on 10/23/2020 with  IAI .  Pharmacy has been consulted for zosyn dosing.  Plan: Zosyn 3.375g IV q8h (4 hour infusion). -Monitor renal function, clinical status, and antibiotic plan  Temp (24hrs), Avg:98.9 F (37.2 C), Min:98.9 F (37.2 C), Max:98.9 F (37.2 C)  Recent Labs  Lab 10/17/20 0708 10/18/20 0747 10/19/20 0554 10/20/20 0633 10/23/20 1122  WBC 15.1*  --  18.2* 17.4* 18.0*  CREATININE 1.99* 2.08* 1.84* 1.71* 1.67*    Estimated Creatinine Clearance: 55.9 mL/min (A) (by C-G formula based on SCr of 1.67 mg/dL (H)).    Allergies  Allergen Reactions   Ace Inhibitors Cough   Statins Cough    Antimicrobials this admission: Zosyn 8/25 >>   Microbiology results: 8/25 BCx:   Thank you for allowing pharmacy to be a part of this patient's care.  Joetta Manners, PharmD, [facility] Emergency Medicine Clinical Pharmacist ED RPh Phone: [place]: [phone]

## 2020-10-23 NOTE — ED Provider Notes (Signed)
Emergency Medicine Provider Triage Evaluation Note  James Glover , a 75 y.o. male  was evaluated in triage.  Pt sent from the [facility] after he had ultrasound and HIDA scan concerning for gangrenous cholecystitis and larger liver abscess.  Dr. Constance Haw with general surgery reviewed scan with radiology, patient was in [place] for HIDA scan and she recommended that he remain here as he will need IR intervention for cholecystostomy tube and abscess drainage.  Review of Systems  Positive: Right Upper quadrant abdominal pain Negative: Fever, vomiting  Physical Exam  BP (!) 143/50 (BP Location: Left Arm)   Pulse 81   Temp 98.9 F (37.2 C)   Resp 16   SpO2 95%  Gen:   Awake, no distress   Resp:  Normal effort  MSK:   Moves extremities without difficulty  Other:  Right upper quadrant tenderness  Medical Decision Making  Medically screening exam initiated at 11:33 AM.  Appropriate orders placed.  James Glover was informed that the remainder of the evaluation will be completed by another provider, this initial triage assessment does not replace that evaluation, and the importance of remaining in the ED until their evaluation is complete.  Patient will need acute bed for IR consult, notified triage   Jacqlyn Larsen, PA-C 10/23/20 [place], MD 10/23/20 1600

## 2020-10-23 NOTE — ED Provider Notes (Signed)
[name] EMERGENCY DEPARTMENT Provider Note   CSN: [id number] Arrival date & time: 10/23/20  1054     History Chief Complaint  Patient presents with   Abdominal Pain    James Glover is a 75 y.o. male.  James Glover is a 75 y.o. male with a history of hypertension, hyperlipidemia, CKD, diabetes, GERD, gangrenous cholecystitis, who presents to the ED for evaluation of worsening cholecystitis.  Patient had right upper quadrant ultrasound yesterday and was sent to [facility] from [facility] center inpatient rehab at [facility] for HIDA scan today for continued evaluation of ongoing cholecystitis.  Symptoms initially started on 8/18 when he presented to [facility] and was noted to have gangrenous cholecystitis, patient has been managed medically with IV antibiotics with plans for delayed cholecystectomy, ultrasound yesterday showed continued cholecystitis with enlarging of some hepatic abscesses, and HIDA scan today showed worsening cholecystitis.  Dr. Constance Haw with general surgery recommended patient stay in [facility] for admission and evaluation with interventional radiology as patient will likely need cholecystostomy tube and potential percutaneous drainage of hepatic abscess.  Patient reports he continues to have some right-sided abdominal pain that is not significantly changed.  Denies fevers or vomiting.  Patient does report feeling fatigued.  No other aggravating or alleviating factors.  The history is provided by the patient and medical records.         Patient Active Problem List   Diagnosis Date Noted   Cholecystitis 10/23/2020   Sepsis ([place]) 10/21/2020   CKD stage 3 due to type 2 diabetes mellitus ([place]) 10/21/2020   Hyperlipidemia associated with type 2 diabetes mellitus ([place]) 10/21/2020   BPH without urinary obstruction 10/21/2020   Thrombocytopenia ([place]) 10/21/2020   Anemia with chronic illness 10/21/2020   Gangrenous  cholecystitis 10/16/2020   GERD without esophagitis 10/16/2020   Liver abscess    Aortic atherosclerosis ([place]) 02/24/2020   Controlled type 2 diabetes mellitus with stage 3 chronic kidney disease, with long-term current use of insulin ([place]) 02/20/2020   Secondary hyperparathyroidism of renal origin ([place]) 02/20/2020   Anemia due to vitamin B12 deficiency 08/08/2019   Prostate cancer ([place]) 06/14/2018   CKD (chronic kidney disease) stage 3, GFR 30-59 ml/min ([place]) 02/01/2018   Type II diabetes mellitus with nephropathy ([place]) 02/15/2013   HTN (hypertension) 01/27/2013   Obesity (BMI 30-39.9) 10/02/2012   Uncontrolled type II diabetes mellitus with nephropathy ([place]) 06/21/2012   Essential hypertension, benign 07/29/2011   Male hypogonadism 07/29/2011   Dyslipidemia 07/29/2011    Past Surgical History:  Procedure Laterality Date   CIRCUMCISION  age 58   CYSTOSCOPY N/A 11/03/2018   Procedure: CYSTOSCOPY FLEXIBLE;  Surgeon: Alexis Frock, MD;  [facility];  Service: Urology;  Laterality: N/A;  NO SEEDS FOUND IN BLADDER   KNEE ARTHROSCOPY Right 11/14/2017   dr Stann Mainland '@SCG'$    RADIOACTIVE SEED IMPLANT N/A 11/03/2018   Procedure: RADIOACTIVE SEED IMPLANT/BRACHYTHERAPY IMPLANT;  Surgeon: Alexis Frock, MD;  Location[facility];  Service: Urology;  Laterality: N/A;       SEEDS IMPLANTED   SPACE OAR INSTILLATION N/A 11/03/2018   Procedure: SPACE OAR INSTILLATION;  Surgeon: Alexis Frock, MD;  Location: [facility];  Service: Urology;  Laterality: N/A;       Family History  Problem Relation Age of Onset   Lymphoma Father    Cancer Father        lymphoma   Hypertension Father  Lymphoma Brother    Cancer Brother        lymphoma   Dementia Mother    Diabetes Mother    Diabetes Sister    Cancer Brother        skin cancer   Diabetes Brother    Lymphoma Paternal Grandfather    Cancer Paternal Grandfather        lymphoma   Diabetes  Brother    Kidney disease Brother    COPD Brother    Heart disease Neg Hx     Social History   Tobacco Use   Smoking status: Never   Smokeless tobacco: Never  Vaping Use   Vaping Use: Never used  Substance Use Topics   Alcohol use: Yes    Alcohol/week: 3.0 standard drinks    Types: 3 Glasses of wine per week    Comment: 3 glasses of wine per week   Drug use: Never    Home Medications Prior to Admission medications   Medication Sig Start Date End Date Taking? Authorizing Provider  traMADol (ULTRAM) 50 MG tablet Take 50 mg by mouth in the morning and at bedtime. 10/15/20  Yes [provider]  ACCU-CHEK GUIDE test strip USE AS DIRECTED 07/17/20   [name], MD  Accu-Chek Softclix Lancets lancets 1 each by Other route 2 (two) times daily. Use as instructed 03/29/19   [name], MD  amoxicillin-clavulanate (AUGMENTIN) 875-125 MG tablet Take 1 tablet by mouth every 12 (twelve) hours for 25 days. 10/20/20 11/14/20  Patrecia Pour, MD  aspirin 81 MG tablet Take 81 mg by mouth daily.    [provider]  famotidine (PEPCID) 40 MG tablet Take 40 mg by mouth at bedtime.    [provider]  fluorouracil (EFUDEX) 5 % cream  07/11/19   [provider]  hydrochlorothiazide (HYDRODIURIL) 25 MG tablet Take 1 tablet (25 mg total) by mouth daily. TAKE 1 TABLET(25 MG) BY MOUTH DAILY Patient taking differently: Take 25 mg by mouth daily. 08/21/20   [name], MD  insulin degludec (TRESIBA FLEXTOUCH) 100 UNIT/ML FlexTouch Pen ADMINISTER 18 UNITS UNDER THE SKIN AT BEDTIME Patient taking differently: Inject 18 Units into the skin at bedtime. 08/21/20   [name], MD  Insulin Pen Needle (BD PEN NEEDLE NANO U/F) 32G X 4 MM MISC 1 each by Does not apply route as needed. 08/27/20   [name], MD  losartan (COZAAR) 50 MG tablet TAKE 1 TABLET BY MOUTH DAILY Patient taking differently: Take 50 mg by mouth daily. 08/21/20   [name], MD  Multiple Vitamins-Minerals (MULTIVITAMIN  WITH MINERALS) tablet Take 1 tablet by mouth daily.    [provider]  Omega-3 Fatty Acids (FISH OIL) 1000 MG CAPS Take 1,000 mg by mouth 2 (two) times daily.    [provider]  omeprazole (PRILOSEC) 40 MG capsule TAKE 1 CAPSULE(40 MG) BY MOUTH IN THE MORNING AND AT BEDTIME Patient taking differently: Take 40 mg by mouth in the morning and at bedtime. 09/05/20   [name], MD  oxybutynin (DITROPAN) 5 MG tablet Take 5 mg by mouth 2 (two) times daily.  07/30/19   [provider]  pioglitazone (ACTOS) 45 MG tablet Take 1 tablet (45 mg total) by mouth daily. 08/21/20   [name], MD  saccharomyces boulardii (FLORASTOR) 250 MG capsule Take 1 capsule (250 mg total) by mouth 2 (two) times daily for 25 days. 10/20/20 11/14/20  Patrecia Pour, MD  saxagliptin HCl (ONGLYZA) 2.5  MG TABS tablet TAKE 1 TABLET(2.5 MG) BY MOUTH DAILY Patient taking differently: Take 2.5 mg by mouth daily. 08/21/20   [name], MD  simvastatin (ZOCOR) 20 MG tablet TAKE 1 TABLET(20 MG) BY MOUTH AT BEDTIME Patient taking differently: Take 20 mg by mouth at bedtime. 08/21/20   [name], MD  tamsulosin (FLOMAX) 0.4 MG CAPS capsule Take 1 capsule (0.4 mg total) by mouth 2 (two) times daily after a meal. For urinary urgency or weak stream. 02/08/19   Bruning, Ashlyn, PA-C  valACYclovir (VALTREX) 1000 MG tablet Take 2,000 mg by mouth as needed (fever blister). 10/08/14   [provider]    Allergies    Ace inhibitors and Statins  Review of Systems   Review of Systems  Constitutional:  Positive for fatigue. Negative for chills and fever.  HENT: Negative.    Respiratory:  Negative for cough and shortness of breath.   Cardiovascular:  Negative for chest pain.  Gastrointestinal:  Positive for abdominal pain. Negative for nausea and vomiting.  Genitourinary:  Negative for dysuria.  Musculoskeletal:  Negative for arthralgias and myalgias.  Skin:  Negative for color change and rash.  Neurological:   Negative for dizziness, syncope and light-headedness.  All other systems reviewed and are negative.  Physical Exam Updated Vital Signs BP (!) 170/56   Pulse 66   Temp 98.9 F (37.2 C)   Resp 16   SpO2 93%   Physical Exam Vitals and nursing note reviewed.  Constitutional:      General: He is not in acute distress.    Appearance: Normal appearance. He is well-developed. He is not diaphoretic.     Comments: Elderly male, alert, chronically ill appearing, but in no acute distress  HENT:     Head: Normocephalic and atraumatic.  Eyes:     General:        Right eye: No discharge.        Left eye: No discharge.     Pupils: Pupils are equal, round, and reactive to light.  Cardiovascular:     Rate and Rhythm: Normal rate and regular rhythm.     Pulses: Normal pulses.     Heart sounds: Normal heart sounds.  Pulmonary:     Effort: Pulmonary effort is normal. No respiratory distress.     Breath sounds: Normal breath sounds. No wheezing or rales.     Comments: Respirations equal and unlabored, patient able to speak in full sentences, lungs clear to auscultation bilaterally  Abdominal:     General: Bowel sounds are normal. There is no distension.     Palpations: Abdomen is soft. There is no mass.     Tenderness: There is abdominal tenderness in the right upper quadrant and right lower quadrant. There is no guarding.     Comments: Abdomen soft, nondistended, bowel sounds present, tenderness throughout the right side of the abdomen, no guarding or peritoneal signs  Musculoskeletal:        General: No deformity.     Cervical back: Neck supple.  Skin:    General: Skin is warm and dry.     Capillary Refill: Capillary refill takes less than 2 seconds.  Neurological:     Mental Status: He is alert and oriented to person, place, and time.     Coordination: Coordination normal.     Comments: Speech is clear, able to follow commands Moves extremities without ataxia, coordination intact   Psychiatric:        Mood and Affect:  Mood normal.        Behavior: Behavior normal.    ED Results / Procedures / Treatments   Labs (all labs ordered are listed, but only abnormal results are displayed) Labs Reviewed  COMPREHENSIVE METABOLIC PANEL - Abnormal; Notable for the following components:      Result Value   Sodium 133 (*)    Glucose, Bld 193 (*)    BUN 31 (*)    Creatinine, Ser 1.67 (*)    Calcium 8.2 (*)    Total Protein 5.4 (*)    Albumin 1.9 (*)    GFR, Estimated 43 (*)    All other components within normal limits  CBC WITH DIFFERENTIAL/PLATELET - Abnormal; Notable for the following components:   WBC 18.0 (*)    RBC 3.19 (*)    Hemoglobin 10.3 (*)    HCT 31.2 (*)    Neutro Abs 15.5 (*)    Lymphs Abs 0.6 (*)    Abs Immature Granulocytes 0.67 (*)    All other components within normal limits  HEMOGLOBIN A1C - Abnormal; Notable for the following components:   Hgb A1c MFr Bld 7.6 (*)    All other components within normal limits  RESP PANEL BY RT-PCR (FLU A&B, COVID) ARPGX2  CULTURE, BLOOD (ROUTINE X 2)  CULTURE, BLOOD (ROUTINE X 2)    EKG None  Radiology NM Hepato W/EF  Result Date: 10/23/2020 CLINICAL DATA:  Concern for cholecystitis. EXAM: NUCLEAR MEDICINE HEPATOBILIARY IMAGING TECHNIQUE: Sequential images of the abdomen were obtained out to 60 minutes following intravenous administration of radiopharmaceutical. RADIOPHARMACEUTICALS:  5.3 mCi Tc-31m Choletec IV COMPARISON:  CT scan 10/15/2020 FINDINGS: Prompt clearance radiotracer from blood pool and homogeneous uptake in liver. Counts are evident in the common bile duct by 20 minutes. Counts continue to fill the small bowel. The gallbladder is not identified at 60 minutes. 3 mg of IV morphine was administered to augment filling of the gallbladder. No filling of the gallbladder following morphine administration. IMPRESSION: Non filling of the gallbladder is consistent with acute cholecystitis. These results will  be called to the ordering clinician or representative by the Radiologist Assistant, and communication documented in the PACS or [employer] Electronically Signed   By: SSuzy BouchardM.D.   On: 10/23/2020 13:11   [place]Abdomen Limited RUQ (LIVER/GB)  Result Date: 10/22/2020 CLINICAL DATA:  Right upper quadrant abdominal pain EXAM: ULTRASOUND ABDOMEN LIMITED RIGHT UPPER QUADRANT COMPARISON:  10/16/2020, CT 10/15/2020 FINDINGS: Gallbladder: The gallbladder is mildly distended, similar to prior examination. There is mild gallbladder wall thickening and pericholecystic fluid again identified. Multiple pericholecystic fluid collections are identified within the adjacent gallbladder fossa. When compared to prior imaging, altogether, the findings are in keeping with gangrenous cholecystitis. Common bile duct: Diameter: 8 mm in proximal diameter. This is stable when compared to prior CT examination. Liver: As noted above, several small cystic lesions are identified within the gallbladder fossa adjacent to the gallbladder itself most in keeping with small hepatic abscesses in the setting of gangrenous cholecystitis. No other focal intrahepatic masses are identified. No intrahepatic biliary ductal dilation. Hepatic parenchymal echogenicity is within normal limits. There is a subcapsular simple appearing fluid collection within the anterior right hepatic lobe measuring 3.3 x 2.3 x 3.6 cm. A a ovoid subcapsular complex fluid collection is seen subjacent to the right hepatic lobe measuring 7.6 x 3.1 x 5.1 cm on image # [id number] most in keeping with a subhepatic abscess, enlarged in size since prior CT examination.  Portal vein is patent on color Doppler imaging with normal direction of blood flow towards the liver. Other: Small right pleural effusion is present IMPRESSION: Findings is in keeping with a gangrenous cholecystitis, similar to prior examination. Multiple small probable hepatic abscesses within the gallbladder  fossa. Enlarging complex fluid collection within the right subhepatic space likely reflecting a enlarging subhepatic abscess given the above findings. 3.6 cm subcapsular simple appearing fluid collection within the anterior right hepatic lobe, new since prior CT examination, of unclear significance. Small right pleural effusion. These results will be called to the ordering clinician or representative by the Radiologist Assistant, and communication documented in the PACS or [employer]. Electronically Signed   By: Fidela Salisbury M.D.   On: 10/22/2020 23:09    Procedures Procedures   Medications Ordered in ED Medications  piperacillin-tazobactam (ZOSYN) IVPB 3.375 g (3.375 g Intravenous New Bag/Given 10/23/20 1422)  aspirin EC tablet 81 mg (has no administration in time range)  traMADol (ULTRAM) tablet 50 mg (has no administration in time range)  hydrochlorothiazide (HYDRODIURIL) tablet 25 mg (has no administration in time range)  losartan (COZAAR) tablet 50 mg (has no administration in time range)  simvastatin (ZOCOR) tablet 20 mg (has no administration in time range)  insulin degludec (TRESIBA) 100 UNIT/ML FlexTouch Pen 18 Units (has no administration in time range)  pioglitazone (ACTOS) tablet 45 mg (has no administration in time range)  linagliptin (TRADJENTA) tablet 5 mg (has no administration in time range)  famotidine (PEPCID) tablet 40 mg (has no administration in time range)  pantoprazole (PROTONIX) EC tablet 40 mg (has no administration in time range)  saccharomyces boulardii (FLORASTOR) capsule 250 mg (has no administration in time range)  oxybutynin (DITROPAN) tablet 5 mg (has no administration in time range)  tamsulosin (FLOMAX) capsule 0.4 mg (has no administration in time range)  fluorouracil (EFUDEX) 5 % cream (has no administration in time range)  enoxaparin (LOVENOX) injection 40 mg (has no administration in time range)  HYDROmorphone (DILAUDID) injection 0.5-1 mg (has no  administration in time range)  ondansetron (ZOFRAN) tablet 4 mg (has no administration in time range)    Or  ondansetron (ZOFRAN) injection 4 mg (has no administration in time range)  insulin aspart (novoLOG) injection 0-15 Units (has no administration in time range)  piperacillin-tazobactam (ZOSYN) IVPB 3.375 g (has no administration in time range)    ED Course  I have reviewed the triage vital signs and the nursing notes.  Pertinent labs & imaging results that were available during my care of the patient were reviewed by me and considered in my medical decision making (see chart for details).    MDM Rules/Calculators/A&P                           Patient presents from nuclear medicine after having a HIDA scan to reassess ongoing gangrenous cholecystitis that has been treated with IV antibiotics.  Ultrasound yesterday and HIDA scan today suggest worsening cholecystitis now with enlarging liver abscesses.  Per Dr. Constance Haw with general surgery at [facility] patient will need admission for IR evaluation and likely cholecystostomy tube and abscess drainage.  Basic labs and COVID swab ordered for admission.  Per hospital notes patient was initially treated with Zosyn, had positive blood cultures and after culture results antibiotic therapy was narrowed to Unasyn, but since then patient has had worsening and progression of infection so we will place patient back on Zosyn at this  time.  Currently patient is overall well-appearing with reassuring vitals not suggestive of sepsis.  Case discussed with Dr. Anselm Pancoast with interventional radiology, recommend medicine admission, order placed for IR to evaluate and treat, they will review prior imaging studies to determine if additional imaging is needed prior to intervention.  Case discussed with Dr. Roosevelt Locks with [facility] hospitalist who will see and admit the patient.  IR request CT abdomen pelvis prior to intervention for better planning.  This has been  ordered and hospitalist team has been notified.  Final Clinical Impression(s) / ED Diagnoses Final diagnoses:  Gangrenous cholecystitis    Rx / DC Orders ED Discharge Orders     None        Janet Berlin 10/24/20 1149    Sherwood Gambler, MD 10/24/20 2306

## 2020-10-24 ENCOUNTER — Inpatient Hospital Stay (HOSPITAL_COMMUNITY): Payer: Medicare PPO

## 2020-10-24 ENCOUNTER — Encounter (HOSPITAL_COMMUNITY): Payer: Self-pay | Admitting: Internal Medicine

## 2020-10-24 DIAGNOSIS — E1121 Type 2 diabetes mellitus with diabetic nephropathy: Secondary | ICD-10-CM

## 2020-10-24 DIAGNOSIS — N183 Chronic kidney disease, stage 3 unspecified: Secondary | ICD-10-CM

## 2020-10-24 DIAGNOSIS — I10 Essential (primary) hypertension: Secondary | ICD-10-CM

## 2020-10-24 DIAGNOSIS — K750 Abscess of liver: Secondary | ICD-10-CM

## 2020-10-24 DIAGNOSIS — C61 Malignant neoplasm of prostate: Secondary | ICD-10-CM

## 2020-10-24 DIAGNOSIS — L89322 Pressure ulcer of left buttock, stage 2: Secondary | ICD-10-CM

## 2020-10-24 DIAGNOSIS — K819 Cholecystitis, unspecified: Secondary | ICD-10-CM

## 2020-10-24 DIAGNOSIS — E785 Hyperlipidemia, unspecified: Secondary | ICD-10-CM

## 2020-10-24 DIAGNOSIS — L8990 Pressure ulcer of unspecified site, unspecified stage: Secondary | ICD-10-CM | POA: Insufficient documentation

## 2020-10-24 HISTORY — PX: IR US GUIDE BX ASP/DRAIN: IMG2392

## 2020-10-24 HISTORY — PX: IR FLUORO GUIDED NEEDLE PLC ASPIRATION/INJECTION LOC: IMG2395

## 2020-10-24 HISTORY — PX: IR GUIDED DRAIN W CATHETER PLACEMENT: IMG719

## 2020-10-24 HISTORY — PX: IR PERC CHOLECYSTOSTOMY: IMG2326

## 2020-10-24 LAB — COMPREHENSIVE METABOLIC PANEL
ALT: 41 U/L (ref 0–44)
AST: 34 U/L (ref 15–41)
Albumin: 1.8 g/dL — ABNORMAL LOW (ref 3.5–5.0)
Alkaline Phosphatase: 69 U/L (ref 38–126)
Anion gap: 8 (ref 5–15)
BUN: 25 mg/dL — ABNORMAL HIGH (ref 8–23)
CO2: 24 mmol/L (ref 22–32)
Calcium: 7.9 mg/dL — ABNORMAL LOW (ref 8.9–10.3)
Chloride: 101 mmol/L (ref 98–111)
Creatinine, Ser: 1.46 mg/dL — ABNORMAL HIGH (ref 0.61–1.24)
GFR, Estimated: 50 mL/min — ABNORMAL LOW (ref >60)
Glucose, Bld: 164 mg/dL — ABNORMAL HIGH (ref 70–99)
Potassium: 3.5 mmol/L (ref 3.5–5.1)
Sodium: 133 mmol/L — ABNORMAL LOW (ref 135–145)
Total Bilirubin: 0.7 mg/dL (ref 0.3–1.2)
Total Protein: 5 g/dL — ABNORMAL LOW (ref 6.5–8.1)

## 2020-10-24 LAB — CBC
HCT: 29.3 % — ABNORMAL LOW (ref 39.0–52.0)
Hemoglobin: 10 g/dL — ABNORMAL LOW (ref 13.0–17.0)
MCH: 32.5 pg (ref 26.0–34.0)
MCHC: 34.1 g/dL (ref 30.0–36.0)
MCV: 95.1 fL (ref 80.0–100.0)
Platelets: 278 10*3/uL (ref 150–400)
RBC: 3.08 MIL/uL — ABNORMAL LOW (ref 4.22–5.81)
RDW: 13.8 % (ref 11.5–15.5)
WBC: 16.5 10*3/uL — ABNORMAL HIGH (ref 4.0–10.5)
nRBC: 0 % (ref 0.0–0.2)

## 2020-10-24 LAB — GLUCOSE, CAPILLARY
Glucose-Capillary: 154 mg/dL — ABNORMAL HIGH (ref 70–99)
Glucose-Capillary: 122 mg/dL — ABNORMAL HIGH (ref 70–99)
Glucose-Capillary: 108 mg/dL — ABNORMAL HIGH (ref 70–99)
Glucose-Capillary: 130 mg/dL — ABNORMAL HIGH (ref 70–99)

## 2020-10-24 LAB — SURGICAL PCR SCREEN
MRSA, PCR: NEGATIVE
Staphylococcus aureus: NEGATIVE

## 2020-10-24 MED ORDER — LIDOCAINE HCL (PF) 1 % IJ SOLN
INTRAMUSCULAR | Status: AC
  Filled 2020-10-24: qty 30

## 2020-10-24 MED ORDER — MIDAZOLAM HCL 2 MG/2ML IJ SOLN
INTRAMUSCULAR | Status: AC | PRN
  Administered 2020-10-24 (×6): 0.5 mg via INTRAVENOUS

## 2020-10-24 MED ORDER — SODIUM CHLORIDE 0.9% FLUSH
5.0000 mL | Freq: Three times a day (TID) | INTRAVENOUS | Status: DC
  Administered 2020-10-24 – 2020-11-05 (×35): 5 mL

## 2020-10-24 MED ORDER — IOHEXOL 240 MG/ML SOLN
50.0000 mL | Freq: Once | INTRAMUSCULAR | Status: AC | PRN
  Administered 2020-10-24: 10 mL via INTRAVENOUS

## 2020-10-24 MED ORDER — LIDOCAINE HCL 1 % IJ SOLN
INTRAMUSCULAR | Status: AC | PRN
Start: 2020-10-24 — End: 2020-10-24
  Administered 2020-10-24: 10 mL

## 2020-10-24 MED ORDER — MIDAZOLAM HCL 2 MG/2ML IJ SOLN
INTRAMUSCULAR | Status: AC
  Filled 2020-10-24: qty 2

## 2020-10-24 MED ORDER — AMLODIPINE BESYLATE 5 MG PO TABS
5.0000 mg | ORAL_TABLET | Freq: Every day | ORAL | Status: DC
  Administered 2020-10-24 – 2020-10-31 (×8): 5 mg via ORAL
  Filled 2020-10-24 (×8): qty 1

## 2020-10-24 MED ORDER — SODIUM CHLORIDE 0.9 % IV SOLN: INTRAVENOUS | Status: DC

## 2020-10-24 MED ORDER — INSULIN GLARGINE-YFGN 100 UNIT/ML ~~LOC~~ SOLN
8.0000 [IU] | Freq: Every day | SUBCUTANEOUS | Status: DC
  Administered 2020-10-24 – 2020-10-27 (×4): 8 [IU] via SUBCUTANEOUS
  Filled 2020-10-24 (×5): qty 0.08

## 2020-10-24 MED ORDER — FENTANYL CITRATE (PF) 100 MCG/2ML IJ SOLN
INTRAMUSCULAR | Status: AC | PRN
  Administered 2020-10-24 (×2): 12.5 ug via INTRAVENOUS
  Administered 2020-10-24 (×5): 25 ug via INTRAVENOUS

## 2020-10-24 MED ORDER — FENTANYL CITRATE (PF) 100 MCG/2ML IJ SOLN
INTRAMUSCULAR | Status: AC
  Filled 2020-10-24: qty 2

## 2020-10-24 NOTE — Consult Note (Signed)
James Glover 08-Jun-1945  [id number].    Requesting MD: Dr. Cathlean Sauer Chief Complaint/Reason for Consult: Acute cholecystitis w/ possible liver abscesses - sent from [facility] for IR procedures  HPI: James Glover is a 75 y.o. male w/ a hx of HTN, HLD, DM2, GERD, CKD3, prostate ca in remission who we were asked to see for cholecystitis.   Patient was recently presented to [facility] on 8/17 for several day hx of abdomianl pain, n/v w/ confusion and sepsis. He was admitted to [facility] from 8/18 - 8/22 [facility] for cholecystitis w/ liver abscesses and Strep bovis bacteremia. Cholecystitis was tx w/ IV abx and was recommended for outpatient follow up for interval cholecystectomy. He was d/c on abx per ID. After discharge he had a HIDA scan done 8/25 that showed non filling of the GB c/w acute cholecystitis. CT 8/25 showed perihepatic complex hepatic lesion measures 5.8 x 5.3 cm, a perihepatic 3.3 cm fluid collection, more caudal perihepatic collection is measuring 7.6 x 4.2 cm (this is new compared to prior exams), and a lateral right perihepatic space collection(s) measuring 8.1 x 2.3 cm. Dr. Blake Divine of [facility] recommended IR cholecystostomy tube / IR drainage of the abscess. Patient was admitted to [facility] and IR was consulted. We were asked to see. Patient has already been to IR for these procedures prior to our teams evaluation and has two drains in place in addition to the cholecystostomy tube. Cholecystostomy tub e with bilious o/p, drain with pale green o/p. Currently patient has no pain. Wife at bedside.  ROS: Review of Systems  All other systems reviewed and are negative.  Family History  Problem Relation Age of Onset   Lymphoma Father    Cancer Father        lymphoma   Hypertension Father    Lymphoma Brother    Cancer Brother        lymphoma   Dementia Mother    Diabetes Mother    Diabetes Sister    Cancer Brother        skin cancer   Diabetes Brother    Lymphoma Paternal  Grandfather    Cancer Paternal Grandfather        lymphoma   Diabetes Brother    Kidney disease Brother    COPD Brother    Heart disease Neg Hx     Past Medical History:  Diagnosis Date   CKD (chronic kidney disease), stage III ([place])    nephrologist-- [facility]--- per lov note 03-08-2018 in epic, stable (baseline Cr 1.5 - 2)   Dyslipidemia    Erectile dysfunction    First degree heart block    GERD (gastroesophageal reflux disease)    Hemorrhoids    internal and external   History of nuclear stress test    05-26-2009 (by dr Rollene Fare due to HTN and DM2)--- normal study w/ no ischemia,  normal LV function and wall motion , ef 70%   Hyperplasia of prostate with lower urinary tract symptoms (LUTS)    Hypertension    Hypogonadism male    Iron deficiency    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 Tresa Moore   Prostate cancer [facility]) urologist-  dr t. Tresa Moore  oncologsit-  dr manning   dx 05-30-2018-- Stage T1c, Gleason 4+5, High Risk   Renal cyst, left    followed by dr t. Tresa Moore--  chronic noncomplex   Type 2 diabetes mellitus ([place])  followed by pcp   Wears partial dentures    upper    Past Surgical History:  Procedure Laterality Date   CIRCUMCISION  age 40   CYSTOSCOPY N/A 11/03/2018   Procedure: CYSTOSCOPY FLEXIBLE;  Surgeon: Alexis Frock, MD;  Location: [facility];  Service: Urology;  Laterality: N/A;  NO SEEDS FOUND IN BLADDER   KNEE ARTHROSCOPY Right 11/14/2017   dr Stann Mainland '@SCG'$    RADIOACTIVE SEED IMPLANT N/A 11/03/2018   Procedure: RADIOACTIVE SEED IMPLANT/BRACHYTHERAPY IMPLANT;  Surgeon: Alexis Frock, MD;  Location: [facility];  Service: Urology;  Laterality: N/A;       SEEDS IMPLANTED   SPACE OAR INSTILLATION N/A 11/03/2018   Procedure: SPACE OAR INSTILLATION;  Surgeon: Alexis Frock, MD;  Location: [facility];  Service: Urology;  Laterality: N/A;    Social History:  reports that he has never  smoked. He has never used smokeless tobacco. He reports current alcohol use of about 3.0 standard drinks per week. He reports that he does not use drugs.  Allergies:  Allergies  Allergen Reactions   Ace Inhibitors Cough   Statins Cough    Medications Prior to Admission  Medication Sig Dispense Refill   traMADol (ULTRAM) 50 MG tablet Take 50 mg by mouth in the morning and at bedtime.     ACCU-CHEK GUIDE test strip USE AS DIRECTED 100 strip 5   Accu-Chek Softclix Lancets lancets 1 each by Other route 2 (two) times daily. Use as instructed 100 each 5   amoxicillin-clavulanate (AUGMENTIN) 875-125 MG tablet Take 1 tablet by mouth every 12 (twelve) hours for 25 days. 50 tablet 0   aspirin 81 MG tablet Take 81 mg by mouth daily.     famotidine (PEPCID) 40 MG tablet Take 40 mg by mouth at bedtime.     fluorouracil (EFUDEX) 5 % cream      hydrochlorothiazide (HYDRODIURIL) 25 MG tablet Take 1 tablet (25 mg total) by mouth daily. TAKE 1 TABLET(25 MG) BY MOUTH DAILY (Patient taking differently: Take 25 mg by mouth daily.) 90 tablet 1   insulin degludec (TRESIBA FLEXTOUCH) 100 UNIT/ML FlexTouch Pen ADMINISTER 18 UNITS UNDER THE SKIN AT BEDTIME (Patient taking differently: Inject 18 Units into the skin at bedtime.) 9 mL 1   Insulin Pen Needle (BD PEN NEEDLE NANO U/F) 32G X 4 MM MISC 1 each by Does not apply route as needed. 100 each 3   losartan (COZAAR) 50 MG tablet TAKE 1 TABLET BY MOUTH DAILY (Patient taking differently: Take 50 mg by mouth daily.) 90 tablet 1   Multiple Vitamins-Minerals (MULTIVITAMIN WITH MINERALS) tablet Take 1 tablet by mouth daily.     Omega-3 Fatty Acids (FISH OIL) 1000 MG CAPS Take 1,000 mg by mouth 2 (two) times daily.     omeprazole (PRILOSEC) 40 MG capsule TAKE 1 CAPSULE(40 MG) BY MOUTH IN THE MORNING AND AT BEDTIME (Patient taking differently: Take 40 mg by mouth in the morning and at bedtime.) 180 capsule 0   oxybutynin (DITROPAN) 5 MG tablet Take 5 mg by mouth 2 (two)  times daily.      pioglitazone (ACTOS) 45 MG tablet Take 1 tablet (45 mg total) by mouth daily. 90 tablet 1   saccharomyces boulardii (FLORASTOR) 250 MG capsule Take 1 capsule (250 mg total) by mouth 2 (two) times daily for 25 days. 50 capsule 0   saxagliptin HCl (ONGLYZA) 2.5 MG TABS tablet TAKE 1 TABLET(2.5 MG) BY MOUTH DAILY (Patient taking differently: Take  2.5 mg by mouth daily.) 90 tablet 1   simvastatin (ZOCOR) 20 MG tablet TAKE 1 TABLET(20 MG) BY MOUTH AT BEDTIME (Patient taking differently: Take 20 mg by mouth at bedtime.) 90 tablet 1   tamsulosin (FLOMAX) 0.4 MG CAPS capsule Take 1 capsule (0.4 mg total) by mouth 2 (two) times daily after a meal. For urinary urgency or weak stream. 60 capsule 5   valACYclovir (VALTREX) 1000 MG tablet Take 2,000 mg by mouth as needed (fever blister).       Physical Exam: Blood pressure (!) 140/47, pulse 68, temperature 98.5 F (36.9 C), temperature source Oral, resp. rate 12, SpO2 93 %. General: pleasant, WD/WN male who is laying in bed in NAD HEENT: head is normocephalic, atraumatic.  Sclera are noninjected.  PERRL.  Ears and nose without any masses or lesions.  Mouth is pink and moist. Dentition fair Heart: regular, rate, and rhythm.  Normal s1,s2. No obvious murmurs, gallops, or rubs noted.  Palpable pedal pulses bilaterally  Lungs: CTAB, no wheezes, rhonchi, or rales noted.  Respiratory effort nonlabored Abd: Soft, obese, appropriately tender post-procedure, cholecystostomy with bilious drainage, JP x2 with pale green drainage, +BS, no masses, hernias, or organomegaly MS: no BUE/BLE edema, calves soft and nontender Skin: warm and dry with no masses, lesions, or rashes Psych: A&Ox4 with an appropriate affect Neuro: cranial nerves grossly intact, equal strength in BUE/BLE bilaterally, normal speech, thought process intact, moves all extremities, gait not assessed   Results for orders placed or performed during the hospital encounter of 10/23/20  (from the past 48 hour(s))  Comprehensive metabolic panel     Status: Abnormal   Collection Time: 10/23/20 11:22 AM  Result Value Ref Range   Sodium 133 (L) 135 - 145 mmol/L   Potassium 3.8 3.5 - 5.1 mmol/L   Chloride 99 98 - 111 mmol/L   CO2 27 22 - 32 mmol/L   Glucose, Bld 193 (H) 70 - 99 mg/dL    Comment: Glucose reference range applies only to samples taken after fasting for at least 8 hours.   BUN 31 (H) 8 - 23 mg/dL   Creatinine, Ser 1.67 (H) 0.61 - 1.24 mg/dL   Calcium 8.2 (L) 8.9 - 10.3 mg/dL   Total Protein 5.4 (L) 6.5 - 8.1 g/dL   Albumin 1.9 (L) 3.5 - 5.0 g/dL   AST 31 15 - 41 U/L   ALT 38 0 - 44 U/L   Alkaline Phosphatase 74 38 - 126 U/L   Total Bilirubin 0.7 0.3 - 1.2 mg/dL   GFR, Estimated 43 (L) >60 mL/min    Comment: (NOTE) Calculated using the CKD-EPI Creatinine Equation (2021)    Anion gap 7 5 - 15    Comment: Performed at [facility], [place] [address], [place], [place] [address]  CBC with Differential     Status: Abnormal   Collection Time: 10/23/20 11:22 AM  Result Value Ref Range   WBC 18.0 (H) 4.0 - 10.5 K/uL   RBC 3.19 (L) 4.22 - 5.81 MIL/uL   Hemoglobin 10.3 (L) 13.0 - 17.0 g/dL   HCT 31.2 (L) 39.0 - 52.0 %   MCV 97.8 80.0 - 100.0 fL   MCH 32.3 26.0 - 34.0 pg   MCHC 33.0 30.0 - 36.0 g/dL   RDW 13.6 11.5 - 15.5 %   Platelets 242 150 - 400 K/uL   nRBC 0.0 0.0 - 0.2 %   Neutrophils Relative % 86 %   Neutro Abs 15.5 (  H) 1.7 - 7.7 K/uL   Lymphocytes Relative 4 %   Lymphs Abs 0.6 (L) 0.7 - 4.0 K/uL   Monocytes Relative 5 %   Monocytes Absolute 0.9 0.1 - 1.0 K/uL   Eosinophils Relative 1 %   Eosinophils Absolute 0.2 0.0 - 0.5 K/uL   Basophils Relative 0 %   Basophils Absolute 0.1 0.0 - 0.1 K/uL   Immature Granulocytes 4 %   Abs Immature Granulocytes 0.67 (H) 0.00 - 0.07 K/uL    Comment: Performed at [facility] [address], [place], [place] [address]  Culture, blood (routine x 2)     Status: None (Preliminary result)   Collection  Time: 10/23/20 12:35 PM   Specimen: Site Not Specified; Blood  Result Value Ref Range   Specimen Description SITE NOT SPECIFIED    Special Requests      BOTTLES DRAWN AEROBIC ONLY Blood Culture results may not be optimal due to an inadequate volume of blood received in culture bottles   Culture      NO GROWTH < 24 HOURS Performed at [facility] [address] [place], [place] [address]    Report Status PENDING   Culture, blood (routine x 2)     Status: None (Preliminary result)   Collection Time: 10/23/20 12:40 PM   Specimen: Site Not Specified; Blood  Result Value Ref Range   Specimen Description SITE NOT SPECIFIED    Special Requests      BOTTLES DRAWN AEROBIC ONLY Blood Culture results may not be optimal due to an inadequate volume of blood received in culture bottles   Culture      NO GROWTH < 24 HOURS Performed at [facility] [address] [place], [place] [address]    Report Status PENDING   Hemoglobin A1c     Status: Abnormal   Collection Time: 10/23/20  1:18 PM  Result Value Ref Range   Hgb A1c MFr Bld 7.6 (H) 4.8 - 5.6 %    Comment: (NOTE) Pre diabetes:          5.7%-6.4%  Diabetes:              >6.4%  Glycemic control for   <7.0% adults with diabetes    Mean Plasma Glucose 171.42 mg/dL    Comment: Performed at [facility] [address], [place], [place] [address]  Resp Panel by RT-PCR (Flu A&B, Covid) Nasopharyngeal Swab     Status: None   Collection Time: 10/23/20  2:27 PM   Specimen: Nasopharyngeal Swab; Nasopharyngeal(NP) swabs in vial transport medium  Result Value Ref Range   SARS Coronavirus 2 by RT PCR NEGATIVE NEGATIVE    Comment: (NOTE) SARS-CoV-2 target nucleic acids are NOT DETECTED.  The SARS-CoV-2 RNA is generally detectable in upper respiratory specimens during the acute phase of infection. The lowest concentration of SARS-CoV-2 viral copies this assay can detect is 138 copies/mL. A negative result does not preclude  SARS-Cov-2 infection and should not be used as the sole basis for treatment or other patient management decisions. A negative result may occur with  improper specimen collection/handling, submission of specimen other than nasopharyngeal swab, presence of viral mutation(s) within the areas targeted by this assay, and inadequate number of viral copies(<138 copies/mL). A negative result must be combined with clinical observations, patient history, and epidemiological information. The expected result is Negative.  Fact Sheet for Patients:  EntrepreneurPulse.com.au  Fact Sheet for Healthcare Providers:  IncredibleEmployment.be  This test is no t yet approved or cleared by the [place] and  has been authorized for detection and/or diagnosis of SARS-CoV-2 by FDA under an Emergency Use Authorization (EUA). This EUA will remain  in effect (meaning this test can be used) for the duration of the COVID-19 declaration under Section 564(b)(1) of the Act, 21 U.S.C.section 360bbb-3(b)(1), unless the authorization is terminated  or revoked sooner.       Influenza A by PCR NEGATIVE NEGATIVE   Influenza B by PCR NEGATIVE NEGATIVE    Comment: (NOTE) The Xpert Xpress SARS-CoV-2/FLU/RSV plus assay is intended as an aid in the diagnosis of influenza from Nasopharyngeal swab specimens and should not be used as a sole basis for treatment. Nasal washings and aspirates are unacceptable for Xpert Xpress SARS-CoV-2/FLU/RSV testing.  Fact Sheet for Patients: EntrepreneurPulse.com.au  Fact Sheet for Healthcare Providers: IncredibleEmployment.be  This test is not yet approved or cleared by the [place] FDA and has been authorized for detection and/or diagnosis of SARS-CoV-2 by FDA under an Emergency Use Authorization (EUA). This EUA will remain in effect (meaning this test can be used) for the duration of the COVID-19  declaration under Section 564(b)(1) of the Act, 21 U.S.C. section 360bbb-3(b)(1), unless the authorization is terminated or revoked.  Performed at [facility], [place] [address] [place], [place] [address]   CBG monitoring, ED     Status: Abnormal   Collection Time: 10/23/20  5:49 PM  Result Value Ref Range   Glucose-Capillary 157 (H) 70 - 99 mg/dL    Comment: Glucose reference range applies only to samples taken after fasting for at least 8 hours.  Glucose, capillary     Status: Abnormal   Collection Time: 10/23/20  8:52 PM  Result Value Ref Range   Glucose-Capillary 190 (H) 70 - 99 mg/dL    Comment: Glucose reference range applies only to samples taken after fasting for at least 8 hours.  CBC     Status: Abnormal   Collection Time: 10/24/20  1:31 AM  Result Value Ref Range   WBC 16.5 (H) 4.0 - 10.5 K/uL   RBC 3.08 (L) 4.22 - 5.81 MIL/uL   Hemoglobin 10.0 (L) 13.0 - 17.0 g/dL   HCT 29.3 (L) 39.0 - 52.0 %   MCV 95.1 80.0 - 100.0 fL   MCH 32.5 26.0 - 34.0 pg   MCHC 34.1 30.0 - 36.0 g/dL   RDW 13.8 11.5 - 15.5 %   Platelets 278 150 - 400 K/uL   nRBC 0.0 0.0 - 0.2 %    Comment: Performed at [facility], [place] [address], [place],  [address]  Comprehensive metabolic panel     Status: Abnormal   Collection Time: 10/24/20  1:31 AM  Result Value Ref Range   Sodium 133 (L) 135 - 145 mmol/L   Potassium 3.5 3.5 - 5.1 mmol/L   Chloride 101 98 - 111 mmol/L   CO2 24 22 - 32 mmol/L   Glucose, Bld 164 (H) 70 - 99 mg/dL    Comment: Glucose reference range applies only to samples taken after fasting for at least 8 hours.   BUN 25 (H) 8 - 23 mg/dL   Creatinine, Ser 1.46 (H) 0.61 - 1.24 mg/dL   Calcium 7.9 (L) 8.9 - 10.3 mg/dL   Total Protein 5.0 (L) 6.5 - 8.1 g/dL   Albumin 1.8 (L) 3.5 - 5.0 g/dL   AST 34 15 - 41 U/L  ALT 41 0 - 44 U/L   Alkaline Phosphatase 69 38 - 126 U/L   Total Bilirubin 0.7 0.3 - 1.2 mg/dL   GFR, Estimated 50 (L) >60 mL/min    Comment:  (NOTE) Calculated using the CKD-EPI Creatinine Equation (2021)    Anion gap 8 5 - 15    Comment: Performed at [facility] [address], [place], [place] [address]  Glucose, capillary     Status: Abnormal   Collection Time: 10/24/20  7:50 AM  Result Value Ref Range   Glucose-Capillary 154 (H) 70 - 99 mg/dL    Comment: Glucose reference range applies only to samples taken after fasting for at least 8 hours.  Surgical pcr screen     Status: None   Collection Time: 10/24/20 10:10 AM   Specimen: Nasal Mucosa; Nasal Swab  Result Value Ref Range   MRSA, PCR NEGATIVE NEGATIVE   Staphylococcus aureus NEGATIVE NEGATIVE    Comment: (NOTE) The Xpert SA Assay (FDA approved for NASAL specimens in patients 63 years of age and older), is one component of a comprehensive surveillance program. It is not intended to diagnose infection nor to guide or monitor treatment. Performed at [facility], [place] [address] [place], [place] [address]   Glucose, capillary     Status: Abnormal   Collection Time: 10/24/20 12:15 PM  Result Value Ref Range   Glucose-Capillary 122 (H) 70 - 99 mg/dL    Comment: Glucose reference range applies only to samples taken after fasting for at least 8 hours.   CT Abdomen Pelvis W Contrast  Result Date: 10/23/2020 CLINICAL DATA:  Complicated cholecystitis. EXAM: CT ABDOMEN AND PELVIS WITH CONTRAST TECHNIQUE: Multidetector CT imaging of the abdomen and pelvis was performed using the standard protocol following bolus administration of intravenous contrast. CONTRAST:  52m OMNIPAQUE IOHEXOL 350 MG/ML SOLN COMPARISON:  Nuclear medicine hepatobiliary study of 10/23/2020. Abdominal ultrasound 10/22/2020. Most recent CT 10/15/2020. FINDINGS: Lower chest: volume loss in the anterior right lung base. Right lower lobe collapse/consolidative change. Moderate right pleural effusion, increased. Tiny left pleural effusion, similar. Mild cardiomegaly with 3 vessel coronary artery  calcification. Hepatobiliary: Slight improvement in gallbladder distension with wall thickening. Improved pericholecystic edema. No calcified stone. No biliary duct dilatation. The perihepatic complex hepatic lesion measures 5.8 x 5.3 cm on 22/3 versus 5.6 x 5.6 cm on the prior exam (when remeasured). A perihepatic 3.3 cm fluid collection on 32/3 is similar to on the prior exam (when remeasured). More caudal perihepatic collection is new at 7.6 x 4.2 cm on 35/3. Lateral right perihepatic space collection or collections at the site of trace ascites on the prior exam. These may all be contiguous, with the largest collection measuring 8.1 x 2.3 cm on 18/3. Pancreas: Fatty replacement through the pancreatic body. No duct dilatation or acute inflammation. Spleen: Normal in size, without focal abnormality. Adrenals/Urinary Tract: Normal adrenal glands. Mild renal cortical thinning bilaterally. 1.4 cm left renal cyst. The bladder is thick walled with mild pericystic edema on 87/3. Stomach/Bowel: Proximal gastric underdistention. Descending duodenal diverticulum. Otherwise normal small bowel. Extensive colonic diverticulosis. Colonic stool burden suggests constipation. Normal terminal ileum. The appendix is retrocecal, without surrounding inflammation. Vascular/Lymphatic: Separate common hepatic and splenic artery origins. Aortic atherosclerosis. No abdominopelvic adenopathy. Reproductive: Radiation seeds in the prostate. Other: No significant free fluid. No free intraperitoneal air. Anasarca, specially about the low chest. Musculoskeletal: No acute osseous abnormality. Lower thoracic spondylosis. IMPRESSION: 1. Decrease in pericholecystic edema, suggesting slightly  improved cholecystitis. Complex intraparenchymal pericholecystic liver lesion is relatively similar in size, most likely abscess. Multiple capsular based fluid collections are new since the prior. One collection is unchanged. 2. Bladder wall thickening,  suspicious for cystitis. This is likely accentuated by underdistention. 3. Increase in moderate right pleural effusion with adjacent consolidation which could represent atelectasis or infection. Trace left pleural fluid is not significantly changed. 4. Coronary artery atherosclerosis. Aortic Atherosclerosis (ICD10-I70.0). 5.  Possible constipation. Electronically Signed   By: Abigail Miyamoto M.D.   On: 10/23/2020 17:39   NM Hepato W/EF  Result Date: 10/23/2020 CLINICAL DATA:  Concern for cholecystitis. EXAM: NUCLEAR MEDICINE HEPATOBILIARY IMAGING TECHNIQUE: Sequential images of the abdomen were obtained out to 60 minutes following intravenous administration of radiopharmaceutical. RADIOPHARMACEUTICALS:  5.3 mCi Tc-77m Choletec IV COMPARISON:  CT scan 10/15/2020 FINDINGS: Prompt clearance radiotracer from blood pool and homogeneous uptake in liver. Counts are evident in the common bile duct by 20 minutes. Counts continue to fill the small bowel. The gallbladder is not identified at 60 minutes. 3 mg of IV morphine was administered to augment filling of the gallbladder. No filling of the gallbladder following morphine administration. IMPRESSION: Non filling of the gallbladder is consistent with acute cholecystitis. These results will be called to the ordering clinician or representative by the Radiologist Assistant, and communication documented in the PACS or [employer] Electronically Signed   By: SSuzy BouchardM.D.   On: 10/23/2020 13:11    Anti-infectives (From admission, onward)    Start     Dose/Rate Route Frequency Ordered Stop   10/23/20 2200  piperacillin-tazobactam (ZOSYN) IVPB 3.375 g        3.375 g 12.5 mL/hr over 240 Minutes Intravenous Every 8 hours 10/23/20 1324     10/23/20 1245  piperacillin-tazobactam (ZOSYN) IVPB 3.375 g        3.375 g 100 mL/hr over 30 Minutes Intravenous  Once 10/23/20 1234 10/23/20 1523       Assessment/Plan Acute Cholecystitis Possible Liver  Abscesses - [facility] was [facility] for cholecystitis w/ liver abscesses and bacteremia on 8/17 - 8/22. Had a HIDA scan done yesterday that showed non filling of the GB c/w acute cholecystitis. CT showed perihepatic complex hepatic lesion measures 5.8 x 5.3 cm, a perihepatic 3.3 cm fluid collection, more caudal perihepatic collection is measuring 7.6 x 4.2 cm (this is new compared to prior exams), and a lateral right perihepatic space collection(s) measuring 8.1 x 2.3 cm. Dr. [facility]ake [facility] recommended IR cholecystostomy tube / IR drainage of the abscess. Patient has already been to IR for these procedures prior to our teams evaluation. We will follow along with you during hospitalization and would recommend follow up with Dr. BConstance Hawas an outpatient. Cont IV abx x4d after source control achieved.  FEN - okay for a regular diet from surgical standpoint, appears to be on CLD currently, IVF per TRH VTE - SCDs, okay for chemical prophylaxis from a general surgery standpoint ID - [email or web address]  AJesusita Oka MD General and [facility]

## 2020-10-24 NOTE — Progress Notes (Signed)
PROGRESS NOTE    ARMARI POINT  [id number] DOB: [birth year] DOA: 10/23/2020 PCP: [name], MD    Brief Narrative:  James Glover was admitted to the hospital the working diagnosis of acute/ subacute acalculus cholecystitis.   75 year old male past medical history for hypertension, type 2 diabetes mellitus, dyslipidemia, invasive prostate cancer status post brachytherapy now in remission, chronic kidney disease stage III.  Recent hospitalization 8/17-8/22/2022 for sepsis due to acute acalculous cholecystitis complicated by subcapsular liver abscess and S. Gallolyticus bacteremia.  He received intravenous antibiotic with Unasyn and discharged on Augmentin to complete 9/16.  Surgery recommended delay operative management.  He was discharged to a skilled nursing facility in stable condition. On the day of his hospitalization he had a follow-up right upper quadrant ultrasound/HIDA scan which showed gangrenous cholecystitis with a large liver abscess that prompted his surgeon to refer him to the hospital for IR cholecystostomy tube and liver abscess drainage. On his initial physical examination blood pressure 143/50, heart rate 81, respiratory rate 16, temperature 98.9, oxygen saturation 95%, his lungs are clear to auscultation bilaterally, heart S1-S2, present, rhythmic, his abdomen was tender in the right upper quadrant, no rebound or guarding, no lower extremity edema.  Sodium 133, potassium 3.8, chloride 99, bicarb 27, glucose 193, BUN 31, creatinine 1.67, white count 18.0, hemoglobin 10.3, hematocrit 31.2, platelets 242. SARS COVID-19 negative.  CT of the abdomen/pelvis with decreased pericholecystic edema suggesting slightly improved cholecystitis.  Complex intraparenchymal pericholecystic liver lesion likely abscess.  Multiple capsular-based fluid collections.  Assessment & Plan:   Principal Problem:   Liver abscess Active Problems:   Essential hypertension, benign   Dyslipidemia    Uncontrolled type II diabetes mellitus with nephropathy (HCC)   Obesity (BMI 30-39.9)   HTN (hypertension)   Type II diabetes mellitus with nephropathy (HCC)   CKD (chronic kidney disease) stage 3, GFR 30-59 ml/min (HCC)   Prostate cancer (HCC)   Gangrenous cholecystitis   CKD stage 3 due to type 2 diabetes mellitus ([place])   Cholecystitis   Pressure injury of skin   Acalculous cholecystis complicated with liver abscess. Patient continue to have abdominal pain. Wbc continue to be elevated at 16, cultures with no growth. He has been afebrile.   Plan to continue antibiotic therapy with zosyn and pain control with hydromorphone Cholecystostomy tube today and liver abscess drain per IR.  Consulted surgery for further inpatient recommendations. Continue gentle hydration and follow up cell count in am.   2. AKI on CKD stage 3a/ hyponatremia renal function with serum cr at 1,46 with k at 3,5 and serum bicarbonate at 24. Continue close follow up on renal function and electrolytes, continue gentle hydration with isotonic saline.   3. T2DM/ dyslipidemia fasting glucose 164, continue sliding scale for glucose cover and monitoring  Decrease basal insulin dose to 8 units to prevent hypoglycemia and hold on linagliptin/ pioglitazone   Continue with simvastatin  4. Hx of prostate CA on remission. No signs of urinary retention, plan to follow up as outpatient.   5. Stage 2 left buttock pressure ulcer. Present on admission. Continue local skin care  6. HTN. Will add amlodipine for blood pressure control, will hold on hctz and losartan for now due to worsening renal function.   7. Obesity class 2. Calculated BMI is 35.25  Patient continue to be at high risk for worsening cholecystitis.   Status is: Inpatient  Remains inpatient appropriate because:Inpatient level of care appropriate due to severity of illness  Dispo:  The patient is from: Home              Anticipated d/c is to: Home               Patient currently is not medically stable to d/c.   Difficult to place patient No   DVT prophylaxis: Enoxaparin   Code Status:    full  Family Communication:  I spoke with patient's family at the bedside, we talked in detail about patient's condition, plan of care and prognosis and all questions were addressed.     Skin Documentation: Pressure Injury 10/23/20 Buttocks Left Stage 2 -  Partial thickness loss of dermis presenting as a shallow open injury with a red, pink wound bed without slough. (Active)  10/23/20 1950  Location: Buttocks  Location Orientation: Left  Staging: Stage 2 -  Partial thickness loss of dermis presenting as a shallow open injury with a red, pink wound bed without slough.  Wound Description (Comments):   Present on Admission: Yes     Consultants:  IR\ Surgery     Antimicrobials:  Zosyn     Subjective: Patient continue to have abdominal discomfort, no nausea or vomiting, no dyspnea or chest pain.   Objective: Vitals:   10/23/20 2258 10/24/20 0330 10/24/20 1001 10/24/20 1227  BP: (!) 164/65 (!) 159/56 (!) 165/53 (!) 157/52  Pulse: 79 67 62 60  Resp: '17 17 18 18  '$ Temp: 98.8 F (37.1 C) 98.1 F (36.7 C) 97.6 F (36.4 C) 98.5 F (36.9 C)  TempSrc: Oral Oral Oral Oral  SpO2: 97% 95% 94% 95%    Intake/Output Summary (Last 24 hours) at 10/24/2020 1424 Last data filed at 10/24/2020 0703 Gross per 24 hour  Intake 240 ml  Output 500 ml  Net -260 ml   There were no vitals filed for this visit.  Examination:   General: Not in pain or dyspnea, deconditioned  Neurology: Awake and alert, non focal  E ENT positive pallor, no icterus, oral mucosa moist Cardiovascular: No JVD. S1-S2 present, rhythmic, no gallops, rubs, or murmurs. No lower extremity edema. Pulmonary: positive breath sounds bilaterally, with no wheezing, rhonchi or rales. Gastrointestinal. Abdomen mild distention, tender to palpation, no rebound or guarding Skin. No  rashes Musculoskeletal: no joint deformities     Data Reviewed: I have personally reviewed following labs and imaging studies  CBC: Recent Labs  Lab 10/19/20 0554 10/20/20 0633 10/23/20 1122 10/24/20 0131  WBC 18.2* 17.4* 18.0* 16.5*  NEUTROABS 16.0* 14.6* 15.5*  --   HGB 12.5* 10.8* 10.3* 10.0*  HCT 36.7* 31.9* 31.2* 29.3*  MCV 96.1 96.7 97.8 95.1  PLT 148* 160 242 0000000   Basic Metabolic Panel: Recent Labs  Lab 10/18/20 0747 10/19/20 0554 10/20/20 0633 10/23/20 1122 10/24/20 0131  NA 132* 130* 132* 133* 133*  K 3.4* 3.3* 3.7 3.8 3.5  CL 97* 98 100 99 101  CO2 '24 23 25 27 24  '$ GLUCOSE 156* 154* 180* 193* 164*  BUN 59* 52* 45* 31* 25*  CREATININE 2.08* 1.84* 1.71* 1.67* 1.46*  CALCIUM 8.0* 7.7* 7.6* 8.2* 7.9*   GFR: Estimated Creatinine Clearance: 64 mL/min (A) (by C-G formula based on SCr of 1.46 mg/dL (H)). Liver Function Tests: Recent Labs  Lab 10/19/20 0554 10/23/20 1122 10/24/20 0131  AST 36 31 34  ALT 52* 38 41  ALKPHOS 74 74 69  BILITOT 1.0 0.7 0.7  PROT 5.7* 5.4* 5.0*  ALBUMIN 2.1* 1.9* 1.8*   No results for  input(s): LIPASE, AMYLASE in the last 168 hours. No results for input(s): AMMONIA in the last 168 hours. Coagulation Profile: No results for input(s): INR, PROTIME in the last 168 hours. Cardiac Enzymes: No results for input(s): CKTOTAL, CKMB, CKMBINDEX, TROPONINI in the last 168 hours. BNP (last 3 results) No results for input(s): PROBNP in the last 8760 hours. HbA1C: Recent Labs    10/23/20 1318  HGBA1C 7.6*   CBG: Recent Labs  Lab 10/20/20 1126 10/23/20 1749 10/23/20 2052 10/24/20 0750 10/24/20 1215  GLUCAP 219* 157* 190* 154* 122*   Lipid Profile: No results for input(s): CHOL, HDL, LDLCALC, TRIG, CHOLHDL, LDLDIRECT in the last 72 hours. Thyroid Function Tests: No results for input(s): TSH, T4TOTAL, FREET4, T3FREE, THYROIDAB in the last 72 hours. Anemia Panel: No results for input(s): VITAMINB12, FOLATE, FERRITIN, TIBC,  IRON, RETICCTPCT in the last 72 hours.    Radiology Studies: I have reviewed all of the imaging during this hospital visit personally     Scheduled Meds:  aspirin EC  81 mg Oral Daily   enoxaparin (LOVENOX) injection  40 mg Subcutaneous Q24H   famotidine  40 mg Oral QHS   hydrochlorothiazide  25 mg Oral Daily   insulin aspart  0-15 Units Subcutaneous TID WC   insulin glargine-yfgn  15 Units Subcutaneous QHS   linagliptin  5 mg Oral Daily   losartan  50 mg Oral Daily   oxybutynin  5 mg Oral BID   pantoprazole  40 mg Oral Daily   pioglitazone  45 mg Oral Daily   saccharomyces boulardii  250 mg Oral BID   simvastatin  20 mg Oral QHS   tamsulosin  0.4 mg Oral BID PC   Continuous Infusions:  piperacillin-tazobactam (ZOSYN)  IV 3.375 g (10/24/20 0526)     LOS: 1 day        James Hennes Gerome Apley, MD

## 2020-10-24 NOTE — Procedures (Signed)
Interventional Radiology Procedure Note  Procedures:  Percutaneous cholecystostomy Infrahepatic abscess drainage catheter placement Superior perihepatic abscess drainage catheter placement Intrahepatic abscess aspiration  Complications: None  Estimated Blood Loss: < 10 mL  Findings: 10 Fr percutaneous cholecystostomy tube placed yielding bile and attached to gravity bag.  Infrahepatic collection yielded fairly clear bilious fluid. 10 Fr drain placed and attached to suction bulb drainage. Sample sent for culture.  Superior perihepatic collection yielded turbid bilious fluid. 10 Fr drain placed and attached to suction bulb drainage. Sample sent for culture.  Small intrahepatic collections near GB fossa aspirated with 18 G needle yielding only 2-3 mL of bloody and bilious fluid. Sample sent for culture.    [name]. Kathlene Cote, M.D Pager:  [phone]

## 2020-10-24 NOTE — Evaluation (Signed)
Physical Therapy Evaluation Patient Details Name: James Glover MRN: [id number] DOB: [birth year] Today's Date: 10/24/2020   History of Present Illness  The pt is a 75 yo male presenting 8/25 from SNF due to ultrasound and HIDA scan concerning for gangrenous cholecystitis and larger liver abscess. Pt reccently hospitalized 8/17-8/22 due to acute cholecystitis complicated by subcapsular live abscess and infection and d/c to SNF for short term rehab. PMH includes: aortic atherosclerosis, CKD III, HTN, DM II, and prostate cancer.   Clinical Impression  Pt in bed upon arrival of PT, agreeable to evaluation at this time. Prior to original admission, the pt was completely independent without need for AD or assist with any ADLs. The pt now presents with limitations in functional mobility, power, strength, activity tolerance, and dynamic stability due to above dx, and will continue to benefit from skilled PT to address these deficits. The pt was able to complete bed mobility with min-modA to manage trunk elevation due to pain at this time, and completed sit-stand transfers with modA to manage power up to standing from low surface due to pt's height. The pt was then able to complete ambulation with use of RW with minG, but no over LOB or physical assist needed. Will benefit from continued therapy to progress independence with bed mobility, strength, power, and stability for improved independence with sit-stand transfers, and activity tolerance, but with continued progress pt is hopeful to return home. Anticipate good progression given prior level of activity and independence as well as good family support.      Follow Up Recommendations Home health PT;Supervision for mobility/OOB (pending progress after procedure)    Equipment Recommendations  Rolling walker with 5" wheels;3in1 (PT) (bariatric and tall (pt 6'3"))    Recommendations for Other Services       Precautions / Restrictions  Precautions Precautions: Fall Restrictions Weight Bearing Restrictions: No      Mobility  Bed Mobility Overal bed mobility: Needs Assistance Bed Mobility: Rolling;Sidelying to Sit;Sit to Sidelying Rolling: Min assist Sidelying to sit: Mod assist     Sit to sidelying: Min assist General bed mobility comments: minA to complete roll and modA to elevate trunk, minA to return BLE to bed    Transfers Overall transfer level: Needs assistance Equipment used: Rolling walker (2 wheeled) Transfers: Sit to/from Stand Sit to Stand: Mod assist         General transfer comment: modA to power up with cues for hand positioning. pt not fully inside RW and leaning to R  Ambulation/Gait Ambulation/Gait assistance: Min guard Gait Distance (Feet): 35 Feet Assistive device: Rolling walker (2 wheeled) Gait Pattern/deviations: Step-through pattern;Decreased stride length;Trunk flexed Gait velocity: decreased Gait velocity interpretation: <1.31 ft/sec, indicative of household ambulator General Gait Details: pt with trunk flexed, reliant on BUE support due to abdominal pain, limited mostly by fatigue/weakness     Balance Overall balance assessment: Needs assistance Sitting-balance support: No upper extremity supported Sitting balance-[name] Scale: Good Sitting balance - Comments: seated EOB   Standing balance support: Bilateral upper extremity supported;During functional activity Standing balance-[name] Scale: Fair Standing balance comment: able to manage short bouts static standing without UE support, RW for gait                             Pertinent Vitals/Pain Pain Assessment: Faces Faces Pain Scale: Hurts a little bit Pain Location: abdomen Pain Descriptors / Indicators: Grimacing;Discomfort Pain Intervention(s): Limited activity within patient's tolerance;Monitored during session;Repositioned  Home Living Family/patient expects to be discharged to:: Private  residence Living Arrangements: Spouse/significant other Available Help at Discharge: Family;Available 24 hours/day Type of Home: House Home Access: Stairs to enter Entrance Stairs-Rails: Psychiatric nurse of Steps: 5-6 Home Layout: Two level;Bed/bath upstairs;Able to live on main level with bedroom/bathroom Home Equipment: None      Prior Function Level of Independence: Independent         Comments: Patient states community ambulation without AD, independent with ADL, retired but [place] with family     Hand Dominance   Dominant Hand: Right    Extremity/Trunk Assessment   Upper Extremity Assessment Upper Extremity Assessment: Overall WFL for tasks assessed    Lower Extremity Assessment Lower Extremity Assessment: Overall WFL for tasks assessed    Cervical / Trunk Assessment Cervical / Trunk Assessment: Normal  Communication   Communication: No difficulties  Cognition Arousal/Alertness: Awake/alert Behavior During Therapy: WFL for tasks assessed/performed Overall Cognitive Status: Within Functional Limits for tasks assessed                                 General Comments: pt pleasant and agreeable through session, needing slight cues for management of IV lines with mobility      General Comments General comments (skin integrity, edema, etc.): VSS on RA    Exercises     Assessment/Plan    PT Assessment Patient needs continued PT services  PT Problem List Decreased strength;Decreased mobility;Decreased activity tolerance;Decreased balance       PT Treatment Interventions DME instruction;Therapeutic exercise;Gait training;Balance training;Stair training;Functional mobility training;Therapeutic activities;Patient/family education    PT Goals (Current goals can be found in the Care Plan section)  Acute Rehab PT Goals Patient Stated Goal: return home PT Goal Formulation: With patient Time For Goal  Achievement: 11/07/20 Potential to Achieve Goals: Good    Frequency Min 3X/week    AM-PAC PT "6 Clicks" Mobility  Outcome Measure Help needed turning from your back to your side while in a flat bed without using bedrails?: A Little Help needed moving from lying on your back to sitting on the side of a flat bed without using bedrails?: A Lot Help needed moving to and from a bed to a chair (including a wheelchair)?: A Lot Help needed standing up from a chair using your arms (e.g., wheelchair or bedside chair)?: A Lot Help needed to walk in hospital room?: A Little Help needed climbing 3-5 steps with a railing? : A Little 6 Click Score: 15    End of Session   Activity Tolerance: Patient tolerated treatment well;Patient limited by fatigue Patient left: in bed;with call bell/phone within reach;with family/visitor present Nurse Communication: Mobility status PT Visit Diagnosis: Unsteadiness on feet (R26.81);Other abnormalities of gait and mobility (R26.89);Muscle weakness (generalized) (M62.81)    Time: [id number] PT Time Calculation (min) (ACUTE ONLY): 19 min   Charges:   PT Evaluation $PT Eval Low Complexity: 1 Low          Laresha Bacorn Allen Kell, PT, DPT   Acute Rehabilitation Department Pager #: [phone]  Sandra Cockayne 10/24/2020, 11:06 AM

## 2020-10-24 NOTE — Progress Notes (Addendum)
Patient is off floor to IR for procedure.  1734 Patient is back to the floor. AXOX4. Zosyn started

## 2020-10-24 NOTE — Consult Note (Signed)
Chief Complaint: Patient was seen in consultation today for gangrenous cholecystitis, liver abscess  Referring Physician(s): Dr. Wynetta Fines  Supervising Physician: Aletta Edouard  Patient Status: [facility] - In-pt  History of Present Illness: James Glover is a 75 y.o. male with past medical history of CKD, GERD, HTN, DM, prior prostate cancer, who presented to [facility] 10/16/20 with abdominal pain and confusion.  He was found to have sepsis with distended gall bladder and liver abscess.  Patient was treated conservatively with IV abx.  He was discharged to the [facility] 8/22, however re-presented to the [facility] ED overnight due to imaging findings consistent with gangrenous cholecystitis, enlarged hepatic abscess. IR consulted for percutaneous cholecystostomy tube placement as well as aspiration and drainage of his hepatic and perihepatic fluid collections.   Patient assessed at bedside alongside his wife and brother.  Patient states he is not feeling better since discharge. Currently on IV Zosyn. Agreeable to procedure today. Does not appear overtly septic. VSS. Afebrile.   Past Medical History:  Diagnosis Date   CKD (chronic kidney disease), stage III [facility])    nephrologist-- [place]--- per lov note 03-08-2018 in epic, stable (baseline Cr 1.5 - 2)   Dyslipidemia    Erectile dysfunction    First degree heart block    GERD (gastroesophageal reflux disease)    Hemorrhoids    internal and external   History of nuclear stress test    05-26-2009 (by dr Rollene Fare due to HTN and DM2)--- normal study w/ no ischemia,  normal LV function and wall motion , ef 70%   Hyperplasia of prostate with lower urinary tract symptoms (LUTS)    Hypertension    Hypogonadism male    Iron deficiency    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 Tresa Moore   Prostate cancer [facility]) urologist-  dr t. Tresa Moore  oncologsit-  dr Tammi Klippel   dx 05-30-2018-- Stage T1c, Gleason 4+5, High Risk   Renal  cyst, left    followed by dr t. Tresa Moore--  chronic noncomplex   Type 2 diabetes mellitus ([place])    followed by pcp   Wears partial dentures    upper    Past Surgical History:  Procedure Laterality Date   CIRCUMCISION  age 89   CYSTOSCOPY N/A 11/03/2018   Procedure: CYSTOSCOPY FLEXIBLE;  Surgeon: Alexis Frock, MD;  Location: [facility];  Service: Urology;  Laterality: N/A;  NO SEEDS FOUND IN BLADDER   KNEE ARTHROSCOPY Right 11/14/2017   dr Stann Mainland '@[facility]'$    RADIOACTIVE SEED IMPLANT N/A 11/03/2018   Procedure: RADIOACTIVE SEED IMPLANT/BRACHYTHERAPY IMPLANT;  Surgeon: Alexis Frock, MD;  Location: [facility];  Service: Urology;  Laterality: N/A;       SEEDS IMPLANTED   SPACE OAR INSTILLATION N/A 11/03/2018   Procedure: SPACE OAR INSTILLATION;  Surgeon: Alexis Frock, MD;  Location: [facility];  Service: Urology;  Laterality: N/A;    Allergies: Ace inhibitors and Statins  Medications: Prior to Admission medications   Medication Sig Start Date End Date Taking? Authorizing Provider  traMADol (ULTRAM) 50 MG tablet Take 50 mg by mouth in the morning and at bedtime. 10/15/20  Yes [provider]  ACCU-CHEK GUIDE test strip USE AS DIRECTED 07/17/20   [name], MD  Accu-Chek Softclix Lancets lancets 1 each by Other route 2 (two) times daily. Use as instructed 03/29/19   [name], MD  amoxicillin-clavulanate (AUGMENTIN) 875-125 MG tablet Take 1  tablet by mouth every 12 (twelve) hours for 25 days. 10/20/20 11/14/20  Patrecia Pour, MD  aspirin 81 MG tablet Take 81 mg by mouth daily.    [provider]  famotidine (PEPCID) 40 MG tablet Take 40 mg by mouth at bedtime.    [provider]  fluorouracil (EFUDEX) 5 % cream  07/11/19   [provider]  hydrochlorothiazide (HYDRODIURIL) 25 MG tablet Take 1 tablet (25 mg total) by mouth daily. TAKE 1 TABLET(25 MG) BY MOUTH DAILY Patient taking differently: Take 25 mg by mouth  daily. 08/21/20   [name], MD  insulin degludec (TRESIBA FLEXTOUCH) 100 UNIT/ML FlexTouch Pen ADMINISTER 18 UNITS UNDER THE SKIN AT BEDTIME Patient taking differently: Inject 18 Units into the skin at bedtime. 08/21/20   [name], MD  Insulin Pen Needle (BD PEN NEEDLE NANO U/F) 32G X 4 MM MISC 1 each by Does not apply route as needed. 08/27/20   [name], MD  losartan (COZAAR) 50 MG tablet TAKE 1 TABLET BY MOUTH DAILY Patient taking differently: Take 50 mg by mouth daily. 08/21/20   [name], MD  Multiple Vitamins-Minerals (MULTIVITAMIN WITH MINERALS) tablet Take 1 tablet by mouth daily.    [provider]  Omega-3 Fatty Acids (FISH OIL) 1000 MG CAPS Take 1,000 mg by mouth 2 (two) times daily.    [provider]  omeprazole (PRILOSEC) 40 MG capsule TAKE 1 CAPSULE(40 MG) BY MOUTH IN THE MORNING AND AT BEDTIME Patient taking differently: Take 40 mg by mouth in the morning and at bedtime. 09/05/20   [name], MD  oxybutynin (DITROPAN) 5 MG tablet Take 5 mg by mouth 2 (two) times daily.  07/30/19   [provider]  pioglitazone (ACTOS) 45 MG tablet Take 1 tablet (45 mg total) by mouth daily. 08/21/20   [name], MD  saccharomyces boulardii (FLORASTOR) 250 MG capsule Take 1 capsule (250 mg total) by mouth 2 (two) times daily for 25 days. 10/20/20 11/14/20  Patrecia Pour, MD  saxagliptin HCl (ONGLYZA) 2.5 MG TABS tablet TAKE 1 TABLET(2.5 MG) BY MOUTH DAILY Patient taking differently: Take 2.5 mg by mouth daily. 08/21/20   [name], MD  simvastatin (ZOCOR) 20 MG tablet TAKE 1 TABLET(20 MG) BY MOUTH AT BEDTIME Patient taking differently: Take 20 mg by mouth at bedtime. 08/21/20   [name], MD  tamsulosin (FLOMAX) 0.4 MG CAPS capsule Take 1 capsule (0.4 mg total) by mouth 2 (two) times daily after a meal. For urinary urgency or weak stream. 02/08/19   Bruning, Ashlyn, PA-C  valACYclovir (VALTREX) 1000 MG tablet Take 2,000 mg by mouth as needed (fever blister). 10/08/14    [provider]     Family History  Problem Relation Age of Onset   Lymphoma Father    Cancer Father        lymphoma   Hypertension Father    Lymphoma Brother    Cancer Brother        lymphoma   Dementia Mother    Diabetes Mother    Diabetes Sister    Cancer Brother        skin cancer   Diabetes Brother    Lymphoma Paternal Grandfather    Cancer Paternal Grandfather        lymphoma   Diabetes Brother    Kidney disease Brother    COPD Brother    Heart disease Neg Hx     Social History   Socioeconomic History  Marital status: Married    Spouse name: Not on file   Number of children: 0   Years of education: Not on file   Highest education level: Not on file  Occupational History   Occupation: police officer--retired    Employer: [employer]  Tobacco Use   Smoking status: Never   Smokeless tobacco: Never  Vaping Use   Vaping Use: Never used  Substance and Sexual Activity   Alcohol use: Yes    Alcohol/week: 3.0 standard drinks    Types: 3 Glasses of wine per week    Comment: 3 glasses of wine per week   Drug use: Never   Sexual activity: Yes  Other Topics Concern   Not on file  Social History Narrative   Retired 06/2011.  Lives with wife, 1 cat.   Has a place on [place], spends 1-2 weeks/month there   Has 9 siblings      Updated 01/2020   Social Determinants of Health   Financial Resource Strain: Not on file  Food Insecurity: Not on file  Transportation Needs: Not on file  Physical Activity: Not on file  Stress: Not on file  Social Connections: Not on file    Review of Systems: A 12 point ROS discussed and pertinent positives are indicated in the HPI above.  All other systems are negative.  Review of Systems  Constitutional:  Negative for fatigue and fever.  Respiratory:  Negative for cough and shortness of breath.   Cardiovascular:  Negative for chest pain.  Gastrointestinal:  Negative for abdominal pain.  Musculoskeletal:   Negative for back pain.  Psychiatric/Behavioral:  Negative for behavioral problems and confusion.    Vital Signs: BP (!) 165/53 (BP Location: Right Arm)   Pulse 62   Temp 97.6 F (36.4 C) (Oral)   Resp 18   SpO2 94%   Physical Exam Vitals and nursing note reviewed.  Constitutional:      General: He is not in acute distress.    Appearance: He is well-developed. He is not ill-appearing.  Cardiovascular:     Rate and Rhythm: Normal rate and regular rhythm.  Pulmonary:     Effort: Pulmonary effort is normal.     Breath sounds: Normal breath sounds.  Skin:    General: Skin is warm and dry.  Neurological:     General: No focal deficit present.     Mental Status: He is alert and oriented to person, place, and time.  Psychiatric:        Mood and Affect: Mood normal.        Behavior: Behavior normal.     MD Evaluation Airway: WNL Heart: WNL Abdomen: WNL Chest/ Lungs: WNL ASA  Classification: 3 Mallampati/Airway Score: Two   Imaging: CT Chest W Contrast  Result Date: 10/15/2020 CLINICAL DATA:  Pneumonia, effusion or abscess suspected, xray done; Abdominal pain, acute, nonlocalized EXAM: CT CHEST, ABDOMEN, AND PELVIS WITH CONTRAST TECHNIQUE: Multidetector CT imaging of the chest, abdomen and pelvis was performed following the standard protocol during bolus administration of intravenous contrast. CONTRAST:  86m OMNIPAQUE IOHEXOL 300 MG/ML  SOLN COMPARISON:  Chest radiograph earlier today. Abdominopelvic CT 08/15/2020 FINDINGS: CT CHEST FINDINGS Cardiovascular: Atherosclerosis of the thoracic aorta. Upper normal heart size. There are coronary artery calcifications. No pericardial effusion. Mediastinum/Nodes: No enlarged mediastinal or hilar lymph nodes. Patulous esophagus without wall thickening. No thyroid nodule. Lungs/Pleura: Small bilateral pleural effusions. Associated bibasilar atelectasis. No evidence of pneumonia. Breathing motion artifact limits detailed  assessment. The  previous 3 mm right lower lobe pulmonary nodule is obscured by atelectasis on the current exam. No findings of pulmonary edema. Musculoskeletal: Small sclerotic focus in the left glenoid, nonspecific. No acute osseous abnormalities. CT ABDOMEN PELVIS FINDINGS Hepatobiliary: The gallbladder is distended. Suspect pericholecystic fat stranding, however there is motion artifact through the gallbladder. There is mild generalized fat stranding in the right upper quadrant. Abnormal appearance of the liver adjacent to the gallbladder fossa with lobulated low-density that extends up to 12 mm into the adjacent liver parenchyma for example series 4, image 62. Suspect small subcapsular collection about the inferior liver tip measuring 4 x 1.7 cm, series 2, image 62, with additional small volume perihepatic ascites. Common bile duct is poorly defined, but not definitively distended. No definitive calcified gallstone. Pancreas: Atrophic. No definite peripancreatic fat stranding. No ductal dilatation. Spleen: Normal in size without focal abnormality. Adrenals/Urinary Tract: No adrenal nodule. There is early excretion of IV contrast in both renal collecting systems. No hydronephrosis. Limited assessment for renal calculi given contrast in the collecting systems. Cyst in the lateral left kidney, similar in appearance to prior. Hyperdense lesion in the mid right kidney is not well-defined on the current exam. Excreted IV contrast in the urinary bladder which is partially distended. Equivocal perivesicular fat stranding. Stomach/Bowel: The stomach is decompressed. There is fat stranding in the right upper quadrant adjacent to the distal stomach and proximal duodenum, detailed assessment is obscured by motion. Motion limits assessment for wall thickening in this region. There is a duodenal diverticulum. Fluid-filled nondilated distal small bowel. No obstruction. Normal appendix. Small to moderate volume of colonic stool. Left colonic  diverticulosis. No diverticulitis. Vascular/Lymphatic: Aortic atherosclerosis. No aortic aneurysm. The portal vein is patent. There is no portal venous or mesenteric gas. Limited assessment for upper abdominal adenopathy due to motion. There is no retroperitoneal or pelvic adenopathy. Reproductive: Brachytherapy seeds in the prostate. Other: Fat stranding and inflammatory changes in the right upper quadrant. Small amount of perihepatic ascites. Questionable subcapsular collection is described. There is no free intra-abdominal air. No abdominal wall hernia. Musculoskeletal: Punctate bone island in the L5, unchanged. Lucent lesion within L1 vertebral body and pedicle is not significantly changed. Mixed density lesion in the right iliac bone near the sacroiliac joint is also stable. IMPRESSION: 1. Small bilateral pleural effusions with bibasilar atelectasis. 2. Aortic atherosclerosis is well as coronary artery calcifications. 3. Gallbladder distension. There is generalized fat stranding in the right upper quadrant the may be secondary to gallbladder inflammation. Motion artifact limits detailed assessment. Abnormal appearance of the liver and a adjacent to the gallbladder fossa, not seen on prior exams. This may represent extension of inflammatory changes or invasion. Suspected small subcapsular collection at the inferior liver tip measuring 4 x 1.7 cm. Small volume perihepatic ascites. Overall findings suggest complicated cholecystitis. Suggest initial assessment with ultrasound. While MRCP may be considered to further delineate the hepatic biliary changes, given inability to hold still for CT, MRI is not recommended at this time. 4. Colonic diverticulosis without diverticulitis. Additional stable chronic findings as described Aortic Atherosclerosis (ICD10-I70.0). Electronically Signed   By: Keith Rake M.D.   On: 10/15/2020 23:18   CT Abdomen Pelvis W Contrast  Result Date: 10/23/2020 CLINICAL DATA:   Complicated cholecystitis. EXAM: CT ABDOMEN AND PELVIS WITH CONTRAST TECHNIQUE: Multidetector CT imaging of the abdomen and pelvis was performed using the standard protocol following bolus administration of intravenous contrast. CONTRAST:  25m OMNIPAQUE IOHEXOL 350 MG/ML SOLN COMPARISON:  Nuclear medicine hepatobiliary study of 10/23/2020. Abdominal ultrasound 10/22/2020. Most recent CT 10/15/2020. FINDINGS: Lower chest: volume loss in the anterior right lung base. Right lower lobe collapse/consolidative change. Moderate right pleural effusion, increased. Tiny left pleural effusion, similar. Mild cardiomegaly with 3 vessel coronary artery calcification. Hepatobiliary: Slight improvement in gallbladder distension with wall thickening. Improved pericholecystic edema. No calcified stone. No biliary duct dilatation. The perihepatic complex hepatic lesion measures 5.8 x 5.3 cm on 22/3 versus 5.6 x 5.6 cm on the prior exam (when remeasured). A perihepatic 3.3 cm fluid collection on 32/3 is similar to on the prior exam (when remeasured). More caudal perihepatic collection is new at 7.6 x 4.2 cm on 35/3. Lateral right perihepatic space collection or collections at the site of trace ascites on the prior exam. These may all be contiguous, with the largest collection measuring 8.1 x 2.3 cm on 18/3. Pancreas: Fatty replacement through the pancreatic body. No duct dilatation or acute inflammation. Spleen: Normal in size, without focal abnormality. Adrenals/Urinary Tract: Normal adrenal glands. Mild renal cortical thinning bilaterally. 1.4 cm left renal cyst. The bladder is thick walled with mild pericystic edema on 87/3. Stomach/Bowel: Proximal gastric underdistention. Descending duodenal diverticulum. Otherwise normal small bowel. Extensive colonic diverticulosis. Colonic stool burden suggests constipation. Normal terminal ileum. The appendix is retrocecal, without surrounding inflammation. Vascular/Lymphatic: Separate common  hepatic and splenic artery origins. Aortic atherosclerosis. No abdominopelvic adenopathy. Reproductive: Radiation seeds in the prostate. Other: No significant free fluid. No free intraperitoneal air. Anasarca, specially about the low chest. Musculoskeletal: No acute osseous abnormality. Lower thoracic spondylosis. IMPRESSION: 1. Decrease in pericholecystic edema, suggesting slightly improved cholecystitis. Complex intraparenchymal pericholecystic liver lesion is relatively similar in size, most likely abscess. Multiple capsular based fluid collections are new since the prior. One collection is unchanged. 2. Bladder wall thickening, suspicious for cystitis. This is likely accentuated by underdistention. 3. Increase in moderate right pleural effusion with adjacent consolidation which could represent atelectasis or infection. Trace left pleural fluid is not significantly changed. 4. Coronary artery atherosclerosis. Aortic Atherosclerosis (ICD10-I70.0). 5.  Possible constipation. Electronically Signed   By: Abigail Miyamoto M.D.   On: 10/23/2020 17:39   CT Abdomen Pelvis W Contrast  Result Date: 10/15/2020 CLINICAL DATA:  Pneumonia, effusion or abscess suspected, xray done; Abdominal pain, acute, nonlocalized EXAM: CT CHEST, ABDOMEN, AND PELVIS WITH CONTRAST TECHNIQUE: Multidetector CT imaging of the chest, abdomen and pelvis was performed following the standard protocol during bolus administration of intravenous contrast. CONTRAST:  24m OMNIPAQUE IOHEXOL 300 MG/ML  SOLN COMPARISON:  Chest radiograph earlier today. Abdominopelvic CT 08/15/2020 FINDINGS: CT CHEST FINDINGS Cardiovascular: Atherosclerosis of the thoracic aorta. Upper normal heart size. There are coronary artery calcifications. No pericardial effusion. Mediastinum/Nodes: No enlarged mediastinal or hilar lymph nodes. Patulous esophagus without wall thickening. No thyroid nodule. Lungs/Pleura: Small bilateral pleural effusions. Associated bibasilar  atelectasis. No evidence of pneumonia. Breathing motion artifact limits detailed assessment. The previous 3 mm right lower lobe pulmonary nodule is obscured by atelectasis on the current exam. No findings of pulmonary edema. Musculoskeletal: Small sclerotic focus in the left glenoid, nonspecific. No acute osseous abnormalities. CT ABDOMEN PELVIS FINDINGS Hepatobiliary: The gallbladder is distended. Suspect pericholecystic fat stranding, however there is motion artifact through the gallbladder. There is mild generalized fat stranding in the right upper quadrant. Abnormal appearance of the liver adjacent to the gallbladder fossa with lobulated low-density that extends up to 12 mm into the adjacent liver parenchyma for example series 4, image 62. Suspect small subcapsular collection  about the inferior liver tip measuring 4 x 1.7 cm, series 2, image 62, with additional small volume perihepatic ascites. Common bile duct is poorly defined, but not definitively distended. No definitive calcified gallstone. Pancreas: Atrophic. No definite peripancreatic fat stranding. No ductal dilatation. Spleen: Normal in size without focal abnormality. Adrenals/Urinary Tract: No adrenal nodule. There is early excretion of IV contrast in both renal collecting systems. No hydronephrosis. Limited assessment for renal calculi given contrast in the collecting systems. Cyst in the lateral left kidney, similar in appearance to prior. Hyperdense lesion in the mid right kidney is not well-defined on the current exam. Excreted IV contrast in the urinary bladder which is partially distended. Equivocal perivesicular fat stranding. Stomach/Bowel: The stomach is decompressed. There is fat stranding in the right upper quadrant adjacent to the distal stomach and proximal duodenum, detailed assessment is obscured by motion. Motion limits assessment for wall thickening in this region. There is a duodenal diverticulum. Fluid-filled nondilated distal small  bowel. No obstruction. Normal appendix. Small to moderate volume of colonic stool. Left colonic diverticulosis. No diverticulitis. Vascular/Lymphatic: Aortic atherosclerosis. No aortic aneurysm. The portal vein is patent. There is no portal venous or mesenteric gas. Limited assessment for upper abdominal adenopathy due to motion. There is no retroperitoneal or pelvic adenopathy. Reproductive: Brachytherapy seeds in the prostate. Other: Fat stranding and inflammatory changes in the right upper quadrant. Small amount of perihepatic ascites. Questionable subcapsular collection is described. There is no free intra-abdominal air. No abdominal wall hernia. Musculoskeletal: Punctate bone island in the L5, unchanged. Lucent lesion within L1 vertebral body and pedicle is not significantly changed. Mixed density lesion in the right iliac bone near the sacroiliac joint is also stable. IMPRESSION: 1. Small bilateral pleural effusions with bibasilar atelectasis. 2. Aortic atherosclerosis is well as coronary artery calcifications. 3. Gallbladder distension. There is generalized fat stranding in the right upper quadrant the may be secondary to gallbladder inflammation. Motion artifact limits detailed assessment. Abnormal appearance of the liver and a adjacent to the gallbladder fossa, not seen on prior exams. This may represent extension of inflammatory changes or invasion. Suspected small subcapsular collection at the inferior liver tip measuring 4 x 1.7 cm. Small volume perihepatic ascites. Overall findings suggest complicated cholecystitis. Suggest initial assessment with ultrasound. While MRCP may be considered to further delineate the hepatic biliary changes, given inability to hold still for CT, MRI is not recommended at this time. 4. Colonic diverticulosis without diverticulitis. Additional stable chronic findings as described Aortic Atherosclerosis (ICD10-I70.0). Electronically Signed   By: Keith Rake M.D.   On:  10/15/2020 23:18   NM Hepato W/EF  Result Date: 10/23/2020 CLINICAL DATA:  Concern for cholecystitis. EXAM: NUCLEAR MEDICINE HEPATOBILIARY IMAGING TECHNIQUE: Sequential images of the abdomen were obtained out to 60 minutes following intravenous administration of radiopharmaceutical. RADIOPHARMACEUTICALS:  5.3 mCi Tc-17m Choletec IV COMPARISON:  CT scan 10/15/2020 FINDINGS: Prompt clearance radiotracer from blood pool and homogeneous uptake in liver. Counts are evident in the common bile duct by 20 minutes. Counts continue to fill the small bowel. The gallbladder is not identified at 60 minutes. 3 mg of IV morphine was administered to augment filling of the gallbladder. No filling of the gallbladder following morphine administration. IMPRESSION: Non filling of the gallbladder is consistent with acute cholecystitis. These results will be called to the ordering clinician or representative by the Radiologist Assistant, and communication documented in the PACS or [employer] Electronically Signed   By: SSuzy BouchardM.D.   On:  10/23/2020 13:11   DG Chest Port 1 View  Result Date: 10/15/2020 CLINICAL DATA:  Fever and altered mental status. EXAM: PORTABLE CHEST 1 VIEW COMPARISON:  Chest radiograph dated 10/05/2018. FINDINGS: Shallow inspiration. Small left pleural effusion and left lung base atelectasis. Infiltrate is not excluded clinical correlation recommended. There is no pneumothorax. The cardiac silhouette is within normal limits. No acute osseous pathology. IMPRESSION: Small left pleural effusion and left lung base atelectasis versus infiltrate. Electronically Signed   By: Anner Crete M.D.   On: 10/15/2020 20:19   ECHOCARDIOGRAM COMPLETE  Result Date: 10/17/2020    ECHOCARDIOGRAM REPORT   Patient Name:   James Glover Date of Exam: 10/17/2020 Medical Rec #:  [id number]       Height:       75.0 in Accession #:    [id number]      Weight:       278.2 lb Date of Birth:  October 19, 1945       BSA:           2.525 m Patient Age:    10 years        BP:           135/73 mmHg Patient Gender: M               HR:           66 bpm. Exam Location:  [facility] Procedure: 2D Echo, Cardiac Doppler and Color Doppler Indications:    Bacteremia  History:        Patient has prior history of Echocardiogram examinations, most                 recent 02/20/2010. Signs/Symptoms:Altered Mental Status and CKD,                 sepsis; Risk Factors:Diabetes, Hypertension, Dyslipidemia and                 Morbid obesity. Acute cholecystitis, pleural effusion.  Sonographer:    Dustin Flock RDCS Referring Phys: [place]  1. Left ventricular ejection fraction, by estimation, is 60 to 65%. The left ventricle has normal function. The left ventricle has no regional wall motion abnormalities. Left ventricular diastolic parameters were normal.  2. RV-RA gradient 38 mmHg suggests at least mildly elevated RVSP. Right ventricular systolic function is normal. The right ventricular size is normal.  3. There is a trivial pericardial effusion that is circumferential.  4. The mitral valve is grossly normal. Mild mitral valve regurgitation.  5. The aortic valve is tricuspid. There is mild calcification of the aortic valve. Aortic valve regurgitation is not visualized.  6. Unable to estimate CVP.  7. No obvious valvular vegetations. Comparison(s): No prior Echocardiogram. FINDINGS  Left Ventricle: Left ventricular ejection fraction, by estimation, is 60 to 65%. The left ventricle has normal function. The left ventricle has no regional wall motion abnormalities. The left ventricular internal cavity size was normal in size. There is  borderline left ventricular hypertrophy. Left ventricular diastolic parameters were normal. Right Ventricle: RV-RA gradient 38 mmHg suggests at least mildly elevated RVSP. The right ventricular size is normal. No increase in right ventricular wall thickness. Right ventricular systolic function is  normal. Left Atrium: Left atrial size was normal in size. Right Atrium: Right atrial size was normal in size. Pericardium: Trivial pericardial effusion is present. The pericardial effusion is circumferential. Mitral Valve: The mitral valve is grossly normal. Mild mitral annular calcification. Mild mitral  valve regurgitation. Tricuspid Valve: The tricuspid valve is grossly normal. Tricuspid valve regurgitation is mild. Aortic Valve: The aortic valve is tricuspid. There is mild calcification of the aortic valve. There is mild aortic valve annular calcification. Aortic valve regurgitation is not visualized. Pulmonic Valve: The pulmonic valve was grossly normal. Pulmonic valve regurgitation is not visualized. Aorta: The aortic root is normal in size and structure. Venous: Unable to estimate CVP. The inferior vena cava was not well visualized. IAS/Shunts: No atrial level shunt detected by color flow Doppler.  LEFT VENTRICLE PLAX 2D LVIDd:         5.23 cm  Diastology LVIDs:         2.83 cm  LV e' medial:    6.92 cm/s LV PW:         1.06 cm  LV E/e' medial:  9.3 LV IVS:        1.04 cm  LV e' lateral:   8.32 cm/s LVOT diam:     2.20 cm  LV E/e' lateral: 7.7 LV SV:         101 LV SV Index:   40 LVOT Area:     3.80 cm  RIGHT VENTRICLE RV Basal diam:  3.28 cm RV S prime:     4.35 cm/s TAPSE (M-mode): 2.6 cm LEFT ATRIUM             Index       RIGHT ATRIUM           Index LA diam:        3.70 cm 1.47 cm/m  RA Area:     19.60 cm LA Vol (A2C):   63.1 ml 24.99 ml/m RA Volume:   61.90 ml  24.52 ml/m LA Vol (A4C):   54.2 ml 21.47 ml/m LA Biplane Vol: 59.7 ml 23.64 ml/m  AORTIC VALVE LVOT Vmax:   115.00 cm/s LVOT Vmean:  77.200 cm/s LVOT VTI:    0.265 m  AORTA Ao Root diam: 3.10 cm MITRAL VALVE               TRICUSPID VALVE MV Area (PHT): 3.99 cm    TR Peak grad:   38.2 mmHg MV Decel Time: 190 msec    TR Vmax:        309.00 cm/s MV E velocity: 64.30 cm/s MV A velocity: 57.00 cm/s  SHUNTS MV E/A ratio:  1.13        Systemic  VTI:  0.26 m                            Systemic Diam: 2.20 cm Rozann Lesches MD Electronically signed by Rozann Lesches MD Signature Date/Time: 10/17/2020/11:15:33 AM    Final    US Abdomen Limited RUQ (LIVER/GB)  Result Date: 10/22/2020 CLINICAL DATA:  Right upper quadrant abdominal pain EXAM: ULTRASOUND ABDOMEN LIMITED RIGHT UPPER QUADRANT COMPARISON:  10/16/2020, CT 10/15/2020 FINDINGS: Gallbladder: The gallbladder is mildly distended, similar to prior examination. There is mild gallbladder wall thickening and pericholecystic fluid again identified. Multiple pericholecystic fluid collections are identified within the adjacent gallbladder fossa. When compared to prior imaging, altogether, the findings are in keeping with gangrenous cholecystitis. Common bile duct: Diameter: 8 mm in proximal diameter. This is stable when compared to prior CT examination. Liver: As noted above, several small cystic lesions are identified within the gallbladder fossa adjacent to the gallbladder itself most in keeping with small hepatic abscesses in the setting of  gangrenous cholecystitis. No other focal intrahepatic masses are identified. No intrahepatic biliary ductal dilation. Hepatic parenchymal echogenicity is within normal limits. There is a subcapsular simple appearing fluid collection within the anterior right hepatic lobe measuring 3.3 x 2.3 x 3.6 cm. A a ovoid subcapsular complex fluid collection is seen subjacent to the right hepatic lobe measuring 7.6 x 3.1 x 5.1 cm on image # [id number] most in keeping with a subhepatic abscess, enlarged in size since prior CT examination. Portal vein is patent on color Doppler imaging with normal direction of blood flow towards the liver. Other: Small right pleural effusion is present IMPRESSION: Findings is in keeping with a gangrenous cholecystitis, similar to prior examination. Multiple small probable hepatic abscesses within the gallbladder fossa. Enlarging complex fluid collection  within the right subhepatic space likely reflecting a enlarging subhepatic abscess given the above findings. 3.6 cm subcapsular simple appearing fluid collection within the anterior right hepatic lobe, new since prior CT examination, of unclear significance. Small right pleural effusion. These results will be called to the ordering clinician or representative by the Radiologist Assistant, and communication documented in the PACS or [employer]. Electronically Signed   By: Fidela Salisbury M.D.   On: 10/22/2020 23:09   US Abdomen Limited RUQ (LIVER/GB)  Result Date: 10/16/2020 CLINICAL DATA:  Acute cholecystitis. EXAM: ULTRASOUND ABDOMEN LIMITED RIGHT UPPER QUADRANT COMPARISON:  CT AP 10/15/2020 FINDINGS: Gallbladder: Gallbladder wall appears thickened. By my measurements the gallbladder measures up to 4 mm. There is mild pericholecystic fluid identified. No gallstones identified. Small volume of sludge noted within the gallbladder. Negative sonographic Murphy's sign reported. Common bile duct: Diameter: 3.5 mm Liver: Small fluid collection adjacent to the gallbladder in the small parenchymal fluid collections within segment 4 of the liver adjacent to the gallbladder are better seen on the CT from yesterday. Portal vein is patent on color Doppler imaging with normal direction of blood flow towards the liver. Other: None. IMPRESSION: 1. Gallbladder wall thickening, sludge and pericholecystic fluid identified. Cannot exclude acalculous cholecystitis. 2. Fluid collection adjacent to the gallbladder and intraparenchymal low-density foci within segment 4 (also adjacent to the gallbladder) are better seen on the contrast enhanced CT of the abdomen from 10/15/2020. Electronically Signed   By: Kerby Moors M.D.   On: 10/16/2020 11:19    Labs:  CBC: Recent Labs    10/19/20 0554 10/20/20 0633 10/23/20 1122 10/24/20 0131  WBC 18.2* 17.4* 18.0* 16.5*  HGB 12.5* 10.8* 10.3* 10.0*  HCT 36.7* 31.9* 31.2* 29.3*   PLT 148* 160 242 278    COAGS: Recent Labs    10/15/20 2044  INR 1.4*  APTT 38*    BMP: Recent Labs    10/19/20 0554 10/20/20 0633 10/23/20 1122 10/24/20 0131  NA 130* 132* 133* 133*  K 3.3* 3.7 3.8 3.5  CL 98 100 99 101  CO2 '23 25 27 24  '$ GLUCOSE 154* 180* 193* 164*  BUN 52* 45* 31* 25*  CALCIUM 7.7* 7.6* 8.2* 7.9*  CREATININE 1.84* 1.71* 1.67* 1.46*  GFRNONAA 38* 41* 43* 50*    LIVER FUNCTION TESTS: Recent Labs    10/17/20 0708 10/19/20 0554 10/23/20 1122 10/24/20 0131  BILITOT 0.8 1.0 0.7 0.7  AST 52* 36 31 34  ALT 76* 52* 38 41  ALKPHOS 52 74 74 69  PROT 5.8* 5.7* 5.4* 5.0*  ALBUMIN 2.3* 2.1* 1.9* 1.8*    TUMOR MARKERS: No results for input(s): AFPTM, CEA, CA199, CHROMGRNA in the last 8760 hours.  Assessment and Plan: Gangrenous cholecystitis  Hepatic abscess Perihepatic abscesses Patient recently discharged from [facility] after treatment for cholecystitis and hepatic abscess, now admitted to [facility] with worsening imaging and ongoing symptoms.  IR consulted for aspiration and drainage of hepatic/perihepatic abscesses, as well as percutaneous cholecystostomy tube placement.  Procedures discussed with patient and family at bedside.  Discussed the possibility of one vs. Multiple drains. At minimum, patient likely needs a percutaneous cholecystostomy tube with aspirations of collections, drains as possible.  He is aware of goals of the procedure and is agreeable to proceed today.  He has been NPO.  Lovenox and aspirin held this AM.   Risks and benefits discussed with the patient including bleeding, infection, damage to adjacent structures, bowel perforation/fistula connection, and sepsis.  All of the patient's questions were answered, patient is agreeable to proceed. Consent signed and in chart.  Thank you for this interesting consult.  I greatly enjoyed meeting DEREKE BINION and look forward to participating in their care.  A copy of this report was sent to  the requesting provider on this date.  Electronically Signed: Docia Barrier, PA 10/24/2020, 11:31 AM   I spent a total of 40 Minutes    in face to face in clinical consultation, greater than 50% of which was counseling/coordinating care for cholecystitis, hepatic abscess, perihepatic abscesses.

## 2020-10-25 DIAGNOSIS — E1122 Type 2 diabetes mellitus with diabetic chronic kidney disease: Secondary | ICD-10-CM

## 2020-10-25 LAB — BASIC METABOLIC PANEL
Anion gap: 10 (ref 5–15)
BUN: 22 mg/dL (ref 8–23)
CO2: 24 mmol/L (ref 22–32)
Calcium: 8.2 mg/dL — ABNORMAL LOW (ref 8.9–10.3)
Chloride: 100 mmol/L (ref 98–111)
Creatinine, Ser: 1.66 mg/dL — ABNORMAL HIGH (ref 0.61–1.24)
GFR, Estimated: 43 mL/min — ABNORMAL LOW (ref >60)
Glucose, Bld: 169 mg/dL — ABNORMAL HIGH (ref 70–99)
Potassium: 4 mmol/L (ref 3.5–5.1)
Sodium: 134 mmol/L — ABNORMAL LOW (ref 135–145)

## 2020-10-25 LAB — CBC
HCT: 34.2 % — ABNORMAL LOW (ref 39.0–52.0)
Hemoglobin: 11.1 g/dL — ABNORMAL LOW (ref 13.0–17.0)
MCH: 31.7 pg (ref 26.0–34.0)
MCHC: 32.5 g/dL (ref 30.0–36.0)
MCV: 97.7 fL (ref 80.0–100.0)
Platelets: 331 10*3/uL (ref 150–400)
RBC: 3.5 MIL/uL — ABNORMAL LOW (ref 4.22–5.81)
RDW: 13.8 % (ref 11.5–15.5)
WBC: 26.2 10*3/uL — ABNORMAL HIGH (ref 4.0–10.5)
nRBC: 0 % (ref 0.0–0.2)

## 2020-10-25 LAB — GLUCOSE, CAPILLARY
Glucose-Capillary: 164 mg/dL — ABNORMAL HIGH (ref 70–99)
Glucose-Capillary: 153 mg/dL — ABNORMAL HIGH (ref 70–99)
Glucose-Capillary: 186 mg/dL — ABNORMAL HIGH (ref 70–99)
Glucose-Capillary: 196 mg/dL — ABNORMAL HIGH (ref 70–99)

## 2020-10-25 NOTE — Progress Notes (Signed)
Subjective/Chief Complaint: Patient is still quite sore, but feels better than before his procedures yesterday    Objective: Vital signs in last 24 hours: Temp:  [97.3 F (36.3 C)-99.4 F (37.4 C)] 97.3 F (36.3 C) (08/27 0825) Pulse Rate:  [60-99] 72 (08/27 0825) Resp:  [12-26] 15 (08/27 0825) BP: (128-187)/(47-100) 129/62 (08/27 0825) SpO2:  [91 %-97 %] 95 % (08/27 0825) Last BM Date: 10/24/20  Intake/Output from previous day: 08/26 0701 - 08/27 0700 In: 1278.7 [P.O.:60; I.V.:1013.5; IV Piggyback:185.2] Out: 1170 [Urine:600; Drains:570] Intake/Output this shift: No intake/output data recorded.  Elderly male in NAD Abd - mild distended; RUQ tenderness Perc chole drain with bilious output Two JP drains in perihepatic fluid collections - serous drainage  Lab Results:  Recent Labs    10/24/20 0131 10/25/20 0226  WBC 16.5* 26.2*  HGB 10.0* 11.1*  HCT 29.3* 34.2*  PLT 278 331   BMET Recent Labs    10/24/20 0131 10/25/20 0226  NA 133* 134*  K 3.5 4.0  CL 101 100  CO2 24 24  GLUCOSE 164* 169*  BUN 25* 22  CREATININE 1.46* 1.66*  CALCIUM 7.9* 8.2*   PT/INR No results for input(s): LABPROT, INR in the last 72 hours. ABG No results for input(s): PHART, HCO3 in the last 72 hours.  Invalid input(s): PCO2, PO2  Studies/Results: CT Abdomen Pelvis W Contrast  Result Date: 10/23/2020 CLINICAL DATA:  Complicated cholecystitis. EXAM: CT ABDOMEN AND PELVIS WITH CONTRAST TECHNIQUE: Multidetector CT imaging of the abdomen and pelvis was performed using the standard protocol following bolus administration of intravenous contrast. CONTRAST:  67m OMNIPAQUE IOHEXOL 350 MG/ML SOLN COMPARISON:  Nuclear medicine hepatobiliary study of 10/23/2020. Abdominal ultrasound 10/22/2020. Most recent CT 10/15/2020. FINDINGS: Lower chest: volume loss in the anterior right lung base. Right lower lobe collapse/consolidative change. Moderate right pleural effusion, increased. Tiny left  pleural effusion, similar. Mild cardiomegaly with 3 vessel coronary artery calcification. Hepatobiliary: Slight improvement in gallbladder distension with wall thickening. Improved pericholecystic edema. No calcified stone. No biliary duct dilatation. The perihepatic complex hepatic lesion measures 5.8 x 5.3 cm on 22/3 versus 5.6 x 5.6 cm on the prior exam (when remeasured). A perihepatic 3.3 cm fluid collection on 32/3 is similar to on the prior exam (when remeasured). More caudal perihepatic collection is new at 7.6 x 4.2 cm on 35/3. Lateral right perihepatic space collection or collections at the site of trace ascites on the prior exam. These may all be contiguous, with the largest collection measuring 8.1 x 2.3 cm on 18/3. Pancreas: Fatty replacement through the pancreatic body. No duct dilatation or acute inflammation. Spleen: Normal in size, without focal abnormality. Adrenals/Urinary Tract: Normal adrenal glands. Mild renal cortical thinning bilaterally. 1.4 cm left renal cyst. The bladder is thick walled with mild pericystic edema on 87/3. Stomach/Bowel: Proximal gastric underdistention. Descending duodenal diverticulum. Otherwise normal small bowel. Extensive colonic diverticulosis. Colonic stool burden suggests constipation. Normal terminal ileum. The appendix is retrocecal, without surrounding inflammation. Vascular/Lymphatic: Separate common hepatic and splenic artery origins. Aortic atherosclerosis. No abdominopelvic adenopathy. Reproductive: Radiation seeds in the prostate. Other: No significant free fluid. No free intraperitoneal air. Anasarca, specially about the low chest. Musculoskeletal: No acute osseous abnormality. Lower thoracic spondylosis. IMPRESSION: 1. Decrease in pericholecystic edema, suggesting slightly improved cholecystitis. Complex intraparenchymal pericholecystic liver lesion is relatively similar in size, most likely abscess. Multiple capsular based fluid collections are new since  the prior. One collection is unchanged. 2. Bladder wall thickening, suspicious for cystitis. This  is likely accentuated by underdistention. 3. Increase in moderate right pleural effusion with adjacent consolidation which could represent atelectasis or infection. Trace left pleural fluid is not significantly changed. 4. Coronary artery atherosclerosis. Aortic Atherosclerosis (ICD10-I70.0). 5.  Possible constipation. Electronically Signed   By: Abigail Miyamoto M.D.   On: 10/23/2020 17:39   IR Guided Drain W Catheter Placement  Result Date: 10/24/2020 INDICATION: Acute cholecystitis, worsening perihepatic fluid collections and intrahepatic fluid collections near the gallbladder fossa. EXAM: 1. PERCUTANEOUS CHOLECYSTOSTOMY TUBE PLACEMENT WITH ULTRASOUND AND FLUOROSCOPIC GUIDANCE 2. PERCUTANEOUS CATHETER DRAINAGE OF INFRAHEPATIC ABSCESS WITH ULTRASOUND AND FLUOROSCOPIC GUIDANCE 3. PERCUTANEOUS CATHETER DRAINAGE SUPERIOR PERIHEPATIC ABSCESS WITH ULTRASOUND AND FLUOROSCOPIC GUIDANCE 4. PERCUTANEOUS NEEDLE ASPIRATION OF HEPATIC ABSCESSES WITH ULTRASOUND GUIDANCE MEDICATIONS: None ANESTHESIA/SEDATION: Moderate (conscious) sedation was employed during this procedure. A total of Versed 3.0 mg and Fentanyl 150 mcg was administered intravenously. Moderate Sedation Time: 47 minutes. The patient's level of consciousness and vital signs were monitored continuously by radiology nursing throughout the procedure under my direct supervision. FLUOROSCOPY TIME:  Fluoroscopy Time: 1.0 minutes.  74 mGy. CONTRAST:  10 mL Omnipaque [id number] COMPLICATIONS: None immediate. PROCEDURE: Informed written consent was obtained from the patient after a thorough discussion of the procedural risks, benefits and alternatives. All questions were addressed. Maximal Sterile Barrier Technique was utilized including caps, mask, sterile gowns, sterile gloves, sterile drape, hand hygiene and skin antiseptic. A timeout was performed prior to the initiation of the  procedure. Initial ultrasound was performed to localize the gallbladder, perihepatic fluid collections and intrahepatic fluid collections. Under ultrasound guidance, a 21 gauge needle was advanced into the gallbladder lumen via a transhepatic approach. After confirming needle tip position, a guidewire was advanced into the gallbladder lumen and a transitional dilator advanced over the wire. The percutaneous tract was dilated over a guidewire and a 10 [place] multipurpose drainage catheter advanced into the gallbladder lumen. Contrast was injected via the drain to confirm position within the gallbladder lumen and the cholecystostomy tube was attached to a gravity drainage bag. Fluid collection just inferior to the tip of the right lobe of the liver was then addressed with advancement of an 18 gauge trocar needle under ultrasound guidance. Aspiration of fluid was performed followed by guidewire advancement, percutaneous tract dilatation and placement of a 10 French drainage catheter. Catheter position was confirmed by fluoroscopy. A fluid sample was withdrawn and sent for culture analysis. The drainage catheter was attached to suction bulb drainage. A separate superior perihepatic fluid collection was then addressed with advancement of an 18 gauge trocar needle under ultrasound guidance. Aspiration of fluid was performed followed by guidewire advancement, percutaneous tract dilatation and placement of a 10 French drainage catheter. Catheter position was confirmed by fluoroscopy. A fluid sample was withdrawn and sent for culture analysis. The drainage catheter was attached to suction bulb drainage. An 18 gauge trocar needle was advanced under ultrasound guidance into the right lobe of the liver adjacent to the gallbladder fossa. Aspiration of several small focal adjacent fluid collections were performed under ultrasound guidance. A fluid sample was sent for culture analysis. The percutaneous cholecystostomy tube and  additional 2 separate perihepatic drainage catheters were then secured at the skin with Prolene retention sutures and StatLock devices. FINDINGS: After placement of the cholecystostomy tube, there is return bile. Fluid aspiration of the inferior perihepatic fluid collection yielded fairly clear appearing bilious fluid. A drainage catheter was placed into this collection given size of the collection as well as somewhat limited initial fluid return  from the 18 gauge needle. Fluid aspiration of the superior perihepatic fluid collection yielded turbid appearing bilious fluid. A drainage catheter was placed given turbid nature of fluid. Aspiration at the level of small fluid collections adjacent to the gallbladder within the liver yielded a small amount of bloody and bilious fluid. A total of approximately 2-3 mL of fluid was able to be aspirated from these small collections and sent for culture analysis. No dominant abscess in the liver is identified to warrant additional drain placement within the liver. IMPRESSION: 1. Placement of 10 French percutaneous cholecystostomy tube to treat acute cholecystitis. The cholecystostomy tube was attached to gravity bag drainage. 2. Percutaneous catheter drainage infra hepatic fluid collection yielding bilious fluid. A 10 French drain was placed and attached to suction bulb drainage. A sample of bilious fluid was sent for culture analysis. 3. Percutaneous catheter drainage of superior perihepatic fluid collection yielding turbid bilious fluid. A 10 French drain was placed and attached to suction bulb drainage. A sample of bilious fluid was sent for culture analysis. 4. Ultrasound-guided aspiration of small intrahepatic fluid collections adjacent to the gallbladder fossa. This yielded bloody and bilious fluid with a 2-3 mL sample sent for culture analysis. Electronically Signed   By: Aletta Edouard M.D.   On: 10/24/2020 17:49   IR Guided Drain W Catheter Placement  Result Date:  10/24/2020 INDICATION: Acute cholecystitis, worsening perihepatic fluid collections and intrahepatic fluid collections near the gallbladder fossa. EXAM: 1. PERCUTANEOUS CHOLECYSTOSTOMY TUBE PLACEMENT WITH ULTRASOUND AND FLUOROSCOPIC GUIDANCE 2. PERCUTANEOUS CATHETER DRAINAGE OF INFRAHEPATIC ABSCESS WITH ULTRASOUND AND FLUOROSCOPIC GUIDANCE 3. PERCUTANEOUS CATHETER DRAINAGE SUPERIOR PERIHEPATIC ABSCESS WITH ULTRASOUND AND FLUOROSCOPIC GUIDANCE 4. PERCUTANEOUS NEEDLE ASPIRATION OF HEPATIC ABSCESSES WITH ULTRASOUND GUIDANCE MEDICATIONS: None ANESTHESIA/SEDATION: Moderate (conscious) sedation was employed during this procedure. A total of Versed 3.0 mg and Fentanyl 150 mcg was administered intravenously. Moderate Sedation Time: 47 minutes. The patient's level of consciousness and vital signs were monitored continuously by radiology nursing throughout the procedure under my direct supervision. FLUOROSCOPY TIME:  Fluoroscopy Time: 1.0 minutes.  74 mGy. CONTRAST:  10 mL Omnipaque [id number] COMPLICATIONS: None immediate. PROCEDURE: Informed written consent was obtained from the patient after a thorough discussion of the procedural risks, benefits and alternatives. All questions were addressed. Maximal Sterile Barrier Technique was utilized including caps, mask, sterile gowns, sterile gloves, sterile drape, hand hygiene and skin antiseptic. A timeout was performed prior to the initiation of the procedure. Initial ultrasound was performed to localize the gallbladder, perihepatic fluid collections and intrahepatic fluid collections. Under ultrasound guidance, a 21 gauge needle was advanced into the gallbladder lumen via a transhepatic approach. After confirming needle tip position, a guidewire was advanced into the gallbladder lumen and a transitional dilator advanced over the wire. The percutaneous tract was dilated over a guidewire and a 10 [place] multipurpose drainage catheter advanced into the gallbladder lumen. Contrast was  injected via the drain to confirm position within the gallbladder lumen and the cholecystostomy tube was attached to a gravity drainage bag. Fluid collection just inferior to the tip of the right lobe of the liver was then addressed with advancement of an 18 gauge trocar needle under ultrasound guidance. Aspiration of fluid was performed followed by guidewire advancement, percutaneous tract dilatation and placement of a 10 French drainage catheter. Catheter position was confirmed by fluoroscopy. A fluid sample was withdrawn and sent for culture analysis. The drainage catheter was attached to suction bulb drainage. A separate superior perihepatic fluid collection was then addressed with  advancement of an 18 gauge trocar needle under ultrasound guidance. Aspiration of fluid was performed followed by guidewire advancement, percutaneous tract dilatation and placement of a 10 French drainage catheter. Catheter position was confirmed by fluoroscopy. A fluid sample was withdrawn and sent for culture analysis. The drainage catheter was attached to suction bulb drainage. An 18 gauge trocar needle was advanced under ultrasound guidance into the right lobe of the liver adjacent to the gallbladder fossa. Aspiration of several small focal adjacent fluid collections were performed under ultrasound guidance. A fluid sample was sent for culture analysis. The percutaneous cholecystostomy tube and additional 2 separate perihepatic drainage catheters were then secured at the skin with Prolene retention sutures and StatLock devices. FINDINGS: After placement of the cholecystostomy tube, there is return bile. Fluid aspiration of the inferior perihepatic fluid collection yielded fairly clear appearing bilious fluid. A drainage catheter was placed into this collection given size of the collection as well as somewhat limited initial fluid return from the 18 gauge needle. Fluid aspiration of the superior perihepatic fluid collection yielded  turbid appearing bilious fluid. A drainage catheter was placed given turbid nature of fluid. Aspiration at the level of small fluid collections adjacent to the gallbladder within the liver yielded a small amount of bloody and bilious fluid. A total of approximately 2-3 mL of fluid was able to be aspirated from these small collections and sent for culture analysis. No dominant abscess in the liver is identified to warrant additional drain placement within the liver. IMPRESSION: 1. Placement of 10 French percutaneous cholecystostomy tube to treat acute cholecystitis. The cholecystostomy tube was attached to gravity bag drainage. 2. Percutaneous catheter drainage infra hepatic fluid collection yielding bilious fluid. A 10 French drain was placed and attached to suction bulb drainage. A sample of bilious fluid was sent for culture analysis. 3. Percutaneous catheter drainage of superior perihepatic fluid collection yielding turbid bilious fluid. A 10 French drain was placed and attached to suction bulb drainage. A sample of bilious fluid was sent for culture analysis. 4. Ultrasound-guided aspiration of small intrahepatic fluid collections adjacent to the gallbladder fossa. This yielded bloody and bilious fluid with a 2-3 mL sample sent for culture analysis. Electronically Signed   By: Aletta Edouard M.D.   On: 10/24/2020 17:49   NM Hepato W/EF  Result Date: 10/23/2020 CLINICAL DATA:  Concern for cholecystitis. EXAM: NUCLEAR MEDICINE HEPATOBILIARY IMAGING TECHNIQUE: Sequential images of the abdomen were obtained out to 60 minutes following intravenous administration of radiopharmaceutical. RADIOPHARMACEUTICALS:  5.3 mCi Tc-11m Choletec IV COMPARISON:  CT scan 10/15/2020 FINDINGS: Prompt clearance radiotracer from blood pool and homogeneous uptake in liver. Counts are evident in the common bile duct by 20 minutes. Counts continue to fill the small bowel. The gallbladder is not identified at 60 minutes. 3 mg of IV  morphine was administered to augment filling of the gallbladder. No filling of the gallbladder following morphine administration. IMPRESSION: Non filling of the gallbladder is consistent with acute cholecystitis. These results will be called to the ordering clinician or representative by the Radiologist Assistant, and communication documented in the PACS or [employer] Electronically Signed   By: SSuzy BouchardM.D.   On: 10/23/2020 13:11   IR Perc Cholecystostomy  Result Date: 10/24/2020 INDICATION: Acute cholecystitis, worsening perihepatic fluid collections and intrahepatic fluid collections near the gallbladder fossa. EXAM: 1. PERCUTANEOUS CHOLECYSTOSTOMY TUBE PLACEMENT WITH ULTRASOUND AND FLUOROSCOPIC GUIDANCE 2. PERCUTANEOUS CATHETER DRAINAGE OF INFRAHEPATIC ABSCESS WITH ULTRASOUND AND FLUOROSCOPIC GUIDANCE 3. PERCUTANEOUS CATHETER  DRAINAGE SUPERIOR PERIHEPATIC ABSCESS WITH ULTRASOUND AND FLUOROSCOPIC GUIDANCE 4. PERCUTANEOUS NEEDLE ASPIRATION OF HEPATIC ABSCESSES WITH ULTRASOUND GUIDANCE MEDICATIONS: None ANESTHESIA/SEDATION: Moderate (conscious) sedation was employed during this procedure. A total of Versed 3.0 mg and Fentanyl 150 mcg was administered intravenously. Moderate Sedation Time: 47 minutes. The patient's level of consciousness and vital signs were monitored continuously by radiology nursing throughout the procedure under my direct supervision. FLUOROSCOPY TIME:  Fluoroscopy Time: 1.0 minutes.  74 mGy. CONTRAST:  10 mL Omnipaque [id number] COMPLICATIONS: None immediate. PROCEDURE: Informed written consent was obtained from the patient after a thorough discussion of the procedural risks, benefits and alternatives. All questions were addressed. Maximal Sterile Barrier Technique was utilized including caps, mask, sterile gowns, sterile gloves, sterile drape, hand hygiene and skin antiseptic. A timeout was performed prior to the initiation of the procedure. Initial ultrasound was performed to  localize the gallbladder, perihepatic fluid collections and intrahepatic fluid collections. Under ultrasound guidance, a 21 gauge needle was advanced into the gallbladder lumen via a transhepatic approach. After confirming needle tip position, a guidewire was advanced into the gallbladder lumen and a transitional dilator advanced over the wire. The percutaneous tract was dilated over a guidewire and a 10 [place] multipurpose drainage catheter advanced into the gallbladder lumen. Contrast was injected via the drain to confirm position within the gallbladder lumen and the cholecystostomy tube was attached to a gravity drainage bag. Fluid collection just inferior to the tip of the right lobe of the liver was then addressed with advancement of an 18 gauge trocar needle under ultrasound guidance. Aspiration of fluid was performed followed by guidewire advancement, percutaneous tract dilatation and placement of a 10 French drainage catheter. Catheter position was confirmed by fluoroscopy. A fluid sample was withdrawn and sent for culture analysis. The drainage catheter was attached to suction bulb drainage. A separate superior perihepatic fluid collection was then addressed with advancement of an 18 gauge trocar needle under ultrasound guidance. Aspiration of fluid was performed followed by guidewire advancement, percutaneous tract dilatation and placement of a 10 French drainage catheter. Catheter position was confirmed by fluoroscopy. A fluid sample was withdrawn and sent for culture analysis. The drainage catheter was attached to suction bulb drainage. An 18 gauge trocar needle was advanced under ultrasound guidance into the right lobe of the liver adjacent to the gallbladder fossa. Aspiration of several small focal adjacent fluid collections were performed under ultrasound guidance. A fluid sample was sent for culture analysis. The percutaneous cholecystostomy tube and additional 2 separate perihepatic drainage  catheters were then secured at the skin with Prolene retention sutures and StatLock devices. FINDINGS: After placement of the cholecystostomy tube, there is return bile. Fluid aspiration of the inferior perihepatic fluid collection yielded fairly clear appearing bilious fluid. A drainage catheter was placed into this collection given size of the collection as well as somewhat limited initial fluid return from the 18 gauge needle. Fluid aspiration of the superior perihepatic fluid collection yielded turbid appearing bilious fluid. A drainage catheter was placed given turbid nature of fluid. Aspiration at the level of small fluid collections adjacent to the gallbladder within the liver yielded a small amount of bloody and bilious fluid. A total of approximately 2-3 mL of fluid was able to be aspirated from these small collections and sent for culture analysis. No dominant abscess in the liver is identified to warrant additional drain placement within the liver. IMPRESSION: 1. Placement of 10 French percutaneous cholecystostomy tube to treat acute cholecystitis. The cholecystostomy tube was  attached to gravity bag drainage. 2. Percutaneous catheter drainage infra hepatic fluid collection yielding bilious fluid. A 10 French drain was placed and attached to suction bulb drainage. A sample of bilious fluid was sent for culture analysis. 3. Percutaneous catheter drainage of superior perihepatic fluid collection yielding turbid bilious fluid. A 10 French drain was placed and attached to suction bulb drainage. A sample of bilious fluid was sent for culture analysis. 4. Ultrasound-guided aspiration of small intrahepatic fluid collections adjacent to the gallbladder fossa. This yielded bloody and bilious fluid with a 2-3 mL sample sent for culture analysis. Electronically Signed   By: Aletta Edouard M.D.   On: 10/24/2020 17:49   IR US Guide Bx Asp/Drain  Result Date: 10/24/2020 INDICATION: Acute cholecystitis, worsening  perihepatic fluid collections and intrahepatic fluid collections near the gallbladder fossa. EXAM: 1. PERCUTANEOUS CHOLECYSTOSTOMY TUBE PLACEMENT WITH ULTRASOUND AND FLUOROSCOPIC GUIDANCE 2. PERCUTANEOUS CATHETER DRAINAGE OF INFRAHEPATIC ABSCESS WITH ULTRASOUND AND FLUOROSCOPIC GUIDANCE 3. PERCUTANEOUS CATHETER DRAINAGE SUPERIOR PERIHEPATIC ABSCESS WITH ULTRASOUND AND FLUOROSCOPIC GUIDANCE 4. PERCUTANEOUS NEEDLE ASPIRATION OF HEPATIC ABSCESSES WITH ULTRASOUND GUIDANCE MEDICATIONS: None ANESTHESIA/SEDATION: Moderate (conscious) sedation was employed during this procedure. A total of Versed 3.0 mg and Fentanyl 150 mcg was administered intravenously. Moderate Sedation Time: 47 minutes. The patient's level of consciousness and vital signs were monitored continuously by radiology nursing throughout the procedure under my direct supervision. FLUOROSCOPY TIME:  Fluoroscopy Time: 1.0 minutes.  74 mGy. CONTRAST:  10 mL Omnipaque [id number] COMPLICATIONS: None immediate. PROCEDURE: Informed written consent was obtained from the patient after a thorough discussion of the procedural risks, benefits and alternatives. All questions were addressed. Maximal Sterile Barrier Technique was utilized including caps, mask, sterile gowns, sterile gloves, sterile drape, hand hygiene and skin antiseptic. A timeout was performed prior to the initiation of the procedure. Initial ultrasound was performed to localize the gallbladder, perihepatic fluid collections and intrahepatic fluid collections. Under ultrasound guidance, a 21 gauge needle was advanced into the gallbladder lumen via a transhepatic approach. After confirming needle tip position, a guidewire was advanced into the gallbladder lumen and a transitional dilator advanced over the wire. The percutaneous tract was dilated over a guidewire and a 10 [place] multipurpose drainage catheter advanced into the gallbladder lumen. Contrast was injected via the drain to confirm position within the  gallbladder lumen and the cholecystostomy tube was attached to a gravity drainage bag. Fluid collection just inferior to the tip of the right lobe of the liver was then addressed with advancement of an 18 gauge trocar needle under ultrasound guidance. Aspiration of fluid was performed followed by guidewire advancement, percutaneous tract dilatation and placement of a 10 French drainage catheter. Catheter position was confirmed by fluoroscopy. A fluid sample was withdrawn and sent for culture analysis. The drainage catheter was attached to suction bulb drainage. A separate superior perihepatic fluid collection was then addressed with advancement of an 18 gauge trocar needle under ultrasound guidance. Aspiration of fluid was performed followed by guidewire advancement, percutaneous tract dilatation and placement of a 10 French drainage catheter. Catheter position was confirmed by fluoroscopy. A fluid sample was withdrawn and sent for culture analysis. The drainage catheter was attached to suction bulb drainage. An 18 gauge trocar needle was advanced under ultrasound guidance into the right lobe of the liver adjacent to the gallbladder fossa. Aspiration of several small focal adjacent fluid collections were performed under ultrasound guidance. A fluid sample was sent for culture analysis. The percutaneous cholecystostomy tube and additional 2 separate perihepatic drainage  catheters were then secured at the skin with Prolene retention sutures and StatLock devices. FINDINGS: After placement of the cholecystostomy tube, there is return bile. Fluid aspiration of the inferior perihepatic fluid collection yielded fairly clear appearing bilious fluid. A drainage catheter was placed into this collection given size of the collection as well as somewhat limited initial fluid return from the 18 gauge needle. Fluid aspiration of the superior perihepatic fluid collection yielded turbid appearing bilious fluid. A drainage catheter  was placed given turbid nature of fluid. Aspiration at the level of small fluid collections adjacent to the gallbladder within the liver yielded a small amount of bloody and bilious fluid. A total of approximately 2-3 mL of fluid was able to be aspirated from these small collections and sent for culture analysis. No dominant abscess in the liver is identified to warrant additional drain placement within the liver. IMPRESSION: 1. Placement of 10 French percutaneous cholecystostomy tube to treat acute cholecystitis. The cholecystostomy tube was attached to gravity bag drainage. 2. Percutaneous catheter drainage infra hepatic fluid collection yielding bilious fluid. A 10 French drain was placed and attached to suction bulb drainage. A sample of bilious fluid was sent for culture analysis. 3. Percutaneous catheter drainage of superior perihepatic fluid collection yielding turbid bilious fluid. A 10 French drain was placed and attached to suction bulb drainage. A sample of bilious fluid was sent for culture analysis. 4. Ultrasound-guided aspiration of small intrahepatic fluid collections adjacent to the gallbladder fossa. This yielded bloody and bilious fluid with a 2-3 mL sample sent for culture analysis. Electronically Signed   By: Aletta Edouard M.D.   On: 10/24/2020 17:49   IR US Guide Bx Asp/Drain  Result Date: 10/24/2020 INDICATION: Acute cholecystitis, worsening perihepatic fluid collections and intrahepatic fluid collections near the gallbladder fossa. EXAM: 1. PERCUTANEOUS CHOLECYSTOSTOMY TUBE PLACEMENT WITH ULTRASOUND AND FLUOROSCOPIC GUIDANCE 2. PERCUTANEOUS CATHETER DRAINAGE OF INFRAHEPATIC ABSCESS WITH ULTRASOUND AND FLUOROSCOPIC GUIDANCE 3. PERCUTANEOUS CATHETER DRAINAGE SUPERIOR PERIHEPATIC ABSCESS WITH ULTRASOUND AND FLUOROSCOPIC GUIDANCE 4. PERCUTANEOUS NEEDLE ASPIRATION OF HEPATIC ABSCESSES WITH ULTRASOUND GUIDANCE MEDICATIONS: None ANESTHESIA/SEDATION: Moderate (conscious) sedation was employed  during this procedure. A total of Versed 3.0 mg and Fentanyl 150 mcg was administered intravenously. Moderate Sedation Time: 47 minutes. The patient's level of consciousness and vital signs were monitored continuously by radiology nursing throughout the procedure under my direct supervision. FLUOROSCOPY TIME:  Fluoroscopy Time: 1.0 minutes.  74 mGy. CONTRAST:  10 mL Omnipaque [id number] COMPLICATIONS: None immediate. PROCEDURE: Informed written consent was obtained from the patient after a thorough discussion of the procedural risks, benefits and alternatives. All questions were addressed. Maximal Sterile Barrier Technique was utilized including caps, mask, sterile gowns, sterile gloves, sterile drape, hand hygiene and skin antiseptic. A timeout was performed prior to the initiation of the procedure. Initial ultrasound was performed to localize the gallbladder, perihepatic fluid collections and intrahepatic fluid collections. Under ultrasound guidance, a 21 gauge needle was advanced into the gallbladder lumen via a transhepatic approach. After confirming needle tip position, a guidewire was advanced into the gallbladder lumen and a transitional dilator advanced over the wire. The percutaneous tract was dilated over a guidewire and a 10 [place] multipurpose drainage catheter advanced into the gallbladder lumen. Contrast was injected via the drain to confirm position within the gallbladder lumen and the cholecystostomy tube was attached to a gravity drainage bag. Fluid collection just inferior to the tip of the right lobe of the liver was then addressed with advancement of an 18 gauge trocar  needle under ultrasound guidance. Aspiration of fluid was performed followed by guidewire advancement, percutaneous tract dilatation and placement of a 10 French drainage catheter. Catheter position was confirmed by fluoroscopy. A fluid sample was withdrawn and sent for culture analysis. The drainage catheter was attached to suction bulb  drainage. A separate superior perihepatic fluid collection was then addressed with advancement of an 18 gauge trocar needle under ultrasound guidance. Aspiration of fluid was performed followed by guidewire advancement, percutaneous tract dilatation and placement of a 10 French drainage catheter. Catheter position was confirmed by fluoroscopy. A fluid sample was withdrawn and sent for culture analysis. The drainage catheter was attached to suction bulb drainage. An 18 gauge trocar needle was advanced under ultrasound guidance into the right lobe of the liver adjacent to the gallbladder fossa. Aspiration of several small focal adjacent fluid collections were performed under ultrasound guidance. A fluid sample was sent for culture analysis. The percutaneous cholecystostomy tube and additional 2 separate perihepatic drainage catheters were then secured at the skin with Prolene retention sutures and StatLock devices. FINDINGS: After placement of the cholecystostomy tube, there is return bile. Fluid aspiration of the inferior perihepatic fluid collection yielded fairly clear appearing bilious fluid. A drainage catheter was placed into this collection given size of the collection as well as somewhat limited initial fluid return from the 18 gauge needle. Fluid aspiration of the superior perihepatic fluid collection yielded turbid appearing bilious fluid. A drainage catheter was placed given turbid nature of fluid. Aspiration at the level of small fluid collections adjacent to the gallbladder within the liver yielded a small amount of bloody and bilious fluid. A total of approximately 2-3 mL of fluid was able to be aspirated from these small collections and sent for culture analysis. No dominant abscess in the liver is identified to warrant additional drain placement within the liver. IMPRESSION: 1. Placement of 10 French percutaneous cholecystostomy tube to treat acute cholecystitis. The cholecystostomy tube was attached  to gravity bag drainage. 2. Percutaneous catheter drainage infra hepatic fluid collection yielding bilious fluid. A 10 French drain was placed and attached to suction bulb drainage. A sample of bilious fluid was sent for culture analysis. 3. Percutaneous catheter drainage of superior perihepatic fluid collection yielding turbid bilious fluid. A 10 French drain was placed and attached to suction bulb drainage. A sample of bilious fluid was sent for culture analysis. 4. Ultrasound-guided aspiration of small intrahepatic fluid collections adjacent to the gallbladder fossa. This yielded bloody and bilious fluid with a 2-3 mL sample sent for culture analysis. Electronically Signed   By: Aletta Edouard M.D.   On: 10/24/2020 17:49   IR US Guide Bx Asp/Drain  Result Date: 10/24/2020 INDICATION: Acute cholecystitis, worsening perihepatic fluid collections and intrahepatic fluid collections near the gallbladder fossa. EXAM: 1. PERCUTANEOUS CHOLECYSTOSTOMY TUBE PLACEMENT WITH ULTRASOUND AND FLUOROSCOPIC GUIDANCE 2. PERCUTANEOUS CATHETER DRAINAGE OF INFRAHEPATIC ABSCESS WITH ULTRASOUND AND FLUOROSCOPIC GUIDANCE 3. PERCUTANEOUS CATHETER DRAINAGE SUPERIOR PERIHEPATIC ABSCESS WITH ULTRASOUND AND FLUOROSCOPIC GUIDANCE 4. PERCUTANEOUS NEEDLE ASPIRATION OF HEPATIC ABSCESSES WITH ULTRASOUND GUIDANCE MEDICATIONS: None ANESTHESIA/SEDATION: Moderate (conscious) sedation was employed during this procedure. A total of Versed 3.0 mg and Fentanyl 150 mcg was administered intravenously. Moderate Sedation Time: 47 minutes. The patient's level of consciousness and vital signs were monitored continuously by radiology nursing throughout the procedure under my direct supervision. FLUOROSCOPY TIME:  Fluoroscopy Time: 1.0 minutes.  74 mGy. CONTRAST:  10 mL Omnipaque [id number] COMPLICATIONS: None immediate. PROCEDURE: Informed written consent was obtained from the patient  after a thorough discussion of the procedural risks, benefits and alternatives.  All questions were addressed. Maximal Sterile Barrier Technique was utilized including caps, mask, sterile gowns, sterile gloves, sterile drape, hand hygiene and skin antiseptic. A timeout was performed prior to the initiation of the procedure. Initial ultrasound was performed to localize the gallbladder, perihepatic fluid collections and intrahepatic fluid collections. Under ultrasound guidance, a 21 gauge needle was advanced into the gallbladder lumen via a transhepatic approach. After confirming needle tip position, a guidewire was advanced into the gallbladder lumen and a transitional dilator advanced over the wire. The percutaneous tract was dilated over a guidewire and a 10 [place] multipurpose drainage catheter advanced into the gallbladder lumen. Contrast was injected via the drain to confirm position within the gallbladder lumen and the cholecystostomy tube was attached to a gravity drainage bag. Fluid collection just inferior to the tip of the right lobe of the liver was then addressed with advancement of an 18 gauge trocar needle under ultrasound guidance. Aspiration of fluid was performed followed by guidewire advancement, percutaneous tract dilatation and placement of a 10 French drainage catheter. Catheter position was confirmed by fluoroscopy. A fluid sample was withdrawn and sent for culture analysis. The drainage catheter was attached to suction bulb drainage. A separate superior perihepatic fluid collection was then addressed with advancement of an 18 gauge trocar needle under ultrasound guidance. Aspiration of fluid was performed followed by guidewire advancement, percutaneous tract dilatation and placement of a 10 French drainage catheter. Catheter position was confirmed by fluoroscopy. A fluid sample was withdrawn and sent for culture analysis. The drainage catheter was attached to suction bulb drainage. An 18 gauge trocar needle was advanced under ultrasound guidance into the right lobe of the  liver adjacent to the gallbladder fossa. Aspiration of several small focal adjacent fluid collections were performed under ultrasound guidance. A fluid sample was sent for culture analysis. The percutaneous cholecystostomy tube and additional 2 separate perihepatic drainage catheters were then secured at the skin with Prolene retention sutures and StatLock devices. FINDINGS: After placement of the cholecystostomy tube, there is return bile. Fluid aspiration of the inferior perihepatic fluid collection yielded fairly clear appearing bilious fluid. A drainage catheter was placed into this collection given size of the collection as well as somewhat limited initial fluid return from the 18 gauge needle. Fluid aspiration of the superior perihepatic fluid collection yielded turbid appearing bilious fluid. A drainage catheter was placed given turbid nature of fluid. Aspiration at the level of small fluid collections adjacent to the gallbladder within the liver yielded a small amount of bloody and bilious fluid. A total of approximately 2-3 mL of fluid was able to be aspirated from these small collections and sent for culture analysis. No dominant abscess in the liver is identified to warrant additional drain placement within the liver. IMPRESSION: 1. Placement of 10 French percutaneous cholecystostomy tube to treat acute cholecystitis. The cholecystostomy tube was attached to gravity bag drainage. 2. Percutaneous catheter drainage infra hepatic fluid collection yielding bilious fluid. A 10 French drain was placed and attached to suction bulb drainage. A sample of bilious fluid was sent for culture analysis. 3. Percutaneous catheter drainage of superior perihepatic fluid collection yielding turbid bilious fluid. A 10 French drain was placed and attached to suction bulb drainage. A sample of bilious fluid was sent for culture analysis. 4. Ultrasound-guided aspiration of small intrahepatic fluid collections adjacent to the  gallbladder fossa. This yielded bloody and bilious fluid with a 2-3 mL  sample sent for culture analysis. Electronically Signed   By: Aletta Edouard M.D.   On: 10/24/2020 17:49   IR Fluoro Guide Ndl Plmt / BX  Result Date: 10/24/2020 INDICATION: Acute cholecystitis, worsening perihepatic fluid collections and intrahepatic fluid collections near the gallbladder fossa. EXAM: 1. PERCUTANEOUS CHOLECYSTOSTOMY TUBE PLACEMENT WITH ULTRASOUND AND FLUOROSCOPIC GUIDANCE 2. PERCUTANEOUS CATHETER DRAINAGE OF INFRAHEPATIC ABSCESS WITH ULTRASOUND AND FLUOROSCOPIC GUIDANCE 3. PERCUTANEOUS CATHETER DRAINAGE SUPERIOR PERIHEPATIC ABSCESS WITH ULTRASOUND AND FLUOROSCOPIC GUIDANCE 4. PERCUTANEOUS NEEDLE ASPIRATION OF HEPATIC ABSCESSES WITH ULTRASOUND GUIDANCE MEDICATIONS: None ANESTHESIA/SEDATION: Moderate (conscious) sedation was employed during this procedure. A total of Versed 3.0 mg and Fentanyl 150 mcg was administered intravenously. Moderate Sedation Time: 47 minutes. The patient's level of consciousness and vital signs were monitored continuously by radiology nursing throughout the procedure under my direct supervision. FLUOROSCOPY TIME:  Fluoroscopy Time: 1.0 minutes.  74 mGy. CONTRAST:  10 mL Omnipaque [id number] COMPLICATIONS: None immediate. PROCEDURE: Informed written consent was obtained from the patient after a thorough discussion of the procedural risks, benefits and alternatives. All questions were addressed. Maximal Sterile Barrier Technique was utilized including caps, mask, sterile gowns, sterile gloves, sterile drape, hand hygiene and skin antiseptic. A timeout was performed prior to the initiation of the procedure. Initial ultrasound was performed to localize the gallbladder, perihepatic fluid collections and intrahepatic fluid collections. Under ultrasound guidance, a 21 gauge needle was advanced into the gallbladder lumen via a transhepatic approach. After confirming needle tip position, a guidewire was advanced  into the gallbladder lumen and a transitional dilator advanced over the wire. The percutaneous tract was dilated over a guidewire and a 10 [place] multipurpose drainage catheter advanced into the gallbladder lumen. Contrast was injected via the drain to confirm position within the gallbladder lumen and the cholecystostomy tube was attached to a gravity drainage bag. Fluid collection just inferior to the tip of the right lobe of the liver was then addressed with advancement of an 18 gauge trocar needle under ultrasound guidance. Aspiration of fluid was performed followed by guidewire advancement, percutaneous tract dilatation and placement of a 10 French drainage catheter. Catheter position was confirmed by fluoroscopy. A fluid sample was withdrawn and sent for culture analysis. The drainage catheter was attached to suction bulb drainage. A separate superior perihepatic fluid collection was then addressed with advancement of an 18 gauge trocar needle under ultrasound guidance. Aspiration of fluid was performed followed by guidewire advancement, percutaneous tract dilatation and placement of a 10 French drainage catheter. Catheter position was confirmed by fluoroscopy. A fluid sample was withdrawn and sent for culture analysis. The drainage catheter was attached to suction bulb drainage. An 18 gauge trocar needle was advanced under ultrasound guidance into the right lobe of the liver adjacent to the gallbladder fossa. Aspiration of several small focal adjacent fluid collections were performed under ultrasound guidance. A fluid sample was sent for culture analysis. The percutaneous cholecystostomy tube and additional 2 separate perihepatic drainage catheters were then secured at the skin with Prolene retention sutures and StatLock devices. FINDINGS: After placement of the cholecystostomy tube, there is return bile. Fluid aspiration of the inferior perihepatic fluid collection yielded fairly clear appearing bilious  fluid. A drainage catheter was placed into this collection given size of the collection as well as somewhat limited initial fluid return from the 18 gauge needle. Fluid aspiration of the superior perihepatic fluid collection yielded turbid appearing bilious fluid. A drainage catheter was placed given turbid nature of fluid. Aspiration at the level of small  fluid collections adjacent to the gallbladder within the liver yielded a small amount of bloody and bilious fluid. A total of approximately 2-3 mL of fluid was able to be aspirated from these small collections and sent for culture analysis. No dominant abscess in the liver is identified to warrant additional drain placement within the liver. IMPRESSION: 1. Placement of 10 French percutaneous cholecystostomy tube to treat acute cholecystitis. The cholecystostomy tube was attached to gravity bag drainage. 2. Percutaneous catheter drainage infra hepatic fluid collection yielding bilious fluid. A 10 French drain was placed and attached to suction bulb drainage. A sample of bilious fluid was sent for culture analysis. 3. Percutaneous catheter drainage of superior perihepatic fluid collection yielding turbid bilious fluid. A 10 French drain was placed and attached to suction bulb drainage. A sample of bilious fluid was sent for culture analysis. 4. Ultrasound-guided aspiration of small intrahepatic fluid collections adjacent to the gallbladder fossa. This yielded bloody and bilious fluid with a 2-3 mL sample sent for culture analysis. Electronically Signed   By: Aletta Edouard M.D.   On: 10/24/2020 17:49    Anti-infectives: Anti-infectives (From admission, onward)    Start     Dose/Rate Route Frequency Ordered Stop   10/23/20 2200  piperacillin-tazobactam (ZOSYN) IVPB 3.375 g        3.375 g 12.5 mL/hr over 240 Minutes Intravenous Every 8 hours 10/23/20 1324     10/23/20 1245  piperacillin-tazobactam (ZOSYN) IVPB 3.375 g        3.375 g 100 mL/hr over 30  Minutes Intravenous  Once 10/23/20 1234 10/23/20 1523       Assessment/Plan: Acute cholecystitis - gangrenous Pericholecystic/ perihepatic fluid collections  S/p percutaneous cholecystostomy S/p percutaneous drainage of two perihepatic fluid collections with aspiration of a possible hepatic abscess  Continue antibiotics Will follow along peripherally, in case patient's status worsens and he needs urgent surgery.  For now, he seems to be improved with drains in place.  Would eventually transition to PO abx, then work towards discharge on abx with drains in place.  Follow-up with Dr. Constance Haw for eventual interval cholecystectomy.   LOS: 2 days    [name] 10/25/2020

## 2020-10-25 NOTE — Progress Notes (Signed)
PROGRESS NOTE    James Glover  [id number] DOB: 1945-06-30 DOA: 10/23/2020 PCP: [name], MD    Brief Narrative:  James Glover was admitted to the hospital the working diagnosis of acute/ subacute acalculus cholecystitis.    75 year old male past medical history for hypertension, type 2 diabetes mellitus, dyslipidemia, invasive prostate cancer status post brachytherapy now in remission, chronic kidney disease stage III.  Recent hospitalization 8/17-8/22/2022 for sepsis due to acute acalculous cholecystitis complicated by subcapsular liver abscess and S. Gallolyticus bacteremia.  He received intravenous antibiotic with Unasyn and discharged on Augmentin to complete 9/16.  Surgery recommended delay operative management.  He was discharged to a skilled nursing facility in stable condition. On the day of his hospitalization he had a follow-up right upper quadrant ultrasound/HIDA scan which showed gangrenous cholecystitis with a large liver abscess that prompted his surgeon to refer him to the hospital for IR cholecystostomy tube and liver abscess drainage. On his initial physical examination blood pressure 143/50, heart rate 81, respiratory rate 16, temperature 98.9, oxygen saturation 95%, his lungs are clear to auscultation bilaterally, heart S1-S2, present, rhythmic, his abdomen was tender in the right upper quadrant, no rebound or guarding, no lower extremity edema.   Sodium 133, potassium 3.8, chloride 99, bicarb 27, glucose 193, BUN 31, creatinine 1.67, white count 18.0, hemoglobin 10.3, hematocrit 31.2, platelets 242. SARS COVID-19 negative.   CT of the abdomen/pelvis with decreased pericholecystic edema suggesting slightly improved cholecystitis.  Complex intraparenchymal pericholecystic liver lesion likely abscess.  Multiple capsular-based fluid collections.   Patient was placed on IV antibiotic therapy with good toleration.  08/26 percutaneous cholecystostomy tube, infrahepatic  abscess drainage placement, superior perihepatic abscess drainage catheter and intrahepatic abscess aspiration.    Assessment & Plan:   Principal Problem:   Liver abscess Active Problems:   Essential hypertension, benign   Dyslipidemia   Uncontrolled type II diabetes mellitus with nephropathy (HCC)   Obesity (BMI 30-39.9)   HTN (hypertension)   Type II diabetes mellitus with nephropathy (HCC)   CKD (chronic kidney disease) stage 3, GFR 30-59 ml/min (HCC)   Prostate cancer (HCC)   Gangrenous cholecystitis   CKD stage 3 due to type 2 diabetes mellitus (HCC)   Cholecystitis   Pressure injury of skin   Acalculous cholecystis complicated with liver abscess, complicated with severe sepsis (end-organ failure AKI), present on admission.  SP 2 hepatic drains, cholecystostomy tube and intrahepatic abscess drain with good toleration.  Today patient is feeling better, no nausea or vomiting, abdominal pain has improved, but not yet back to baseline.  Wbc continue to be elevated at 26, cultures continue with no growth.   Antibiotic therapy with Zosyn will need at least 10 days of antibiotic therapy, will transition to oral at the time of discharge.  Pain control with hydromorphone and tramadol as needed. Out of bed to chair tid with meals. PT and OT evaluation.  Eventual cholecystectomy per Dr Constance Haw.   2. AKI on CKD stage 3a/ hyponatremia  Renal function with serum cr at 1,66, K is 4,0 and serum bicarbonate at 24, NA 134 Continue close follow up on renal function and electrolytes, will hold on IV fluids and continue to encourage po intake.    3. T2DM/ dyslipidemia fasting glucose 169. On sliding scale for glucose cover and monitoring, continue with lower dose of  basal insulin dose to 8 units to prevent hypoglycemia. On hold linagliptin/ pioglitazone    On simvastatin   4. Hx of prostate CA  on remission. No clinical signs of urinary retention.   5. Stage 2 left buttock pressure  ulcer. Present on admission. Local skin care   6. HTN.  blood pressure this am 129 to [id number] mmHg systolic, continue with amlodipine, close blood pressure monitoring.    7. Obesity class 2. Calculated BMI is 35.25 follow as outpatient    8. Chronic multifactorial anemia. Hgb at 11,1 with hct at 34,2. No indication for PRBC transfusion. Plan to follow as outpatient.   Patient continue to be at high risk for worsening sepsis   Status is: Inpatient  Remains inpatient appropriate because:IV treatments appropriate due to intensity of illness or inability to take PO  Dispo: The patient is from: Home              Anticipated d/c is to: Home              Patient currently is not medically stable to d/c.   Difficult to place patient No   DVT prophylaxis: Enoxaparin   Code Status: full Family Communication:  I spoke with patient's wife at the bedside, we talked in detail about patient's condition, plan of care and prognosis and all questions were addressed.     Skin Documentation: Pressure Injury 10/23/20 Buttocks Left Stage 2 -  Partial thickness loss of dermis presenting as a shallow open injury with a red, pink wound bed without slough. (Active)  10/23/20 1950  Location: Buttocks  Location Orientation: Left  Staging: Stage 2 -  Partial thickness loss of dermis presenting as a shallow open injury with a red, pink wound bed without slough.  Wound Description (Comments):   Present on Admission: Yes     Consultants:  IR Surgery   Procedures:  Liver abscess drain x1, and drainage tubes x2 Cholecystostomy tube   Antimicrobials:  Zosyn     Subjective: Patient is feeling better, continue to have abdominal pain, improved but not yet back to baseline, no nausea or vomiting.   Objective: Vitals:   10/25/20 0011 10/25/20 0520 10/25/20 0825 10/25/20 1052  BP: (!) 128/59 (!) 155/67 129/62 (!) 117/53  Pulse: 92 84 72 81  Resp:  '19 15 18  '$ Temp: 98.8 F (37.1 C) 98 F (36.7 C) (!)  97.3 F (36.3 C) 97.6 F (36.4 C)  TempSrc: Oral Oral Oral Oral  SpO2: 95% 91% 95% 95%    Intake/Output Summary (Last 24 hours) at 10/25/2020 1147 Last data filed at 10/25/2020 1030 Gross per 24 hour  Intake 1518.69 ml  Output 1130 ml  Net 388.69 ml   There were no vitals filed for this visit.  Examination:   General: Not in pain or dyspnea, deconditioned  Neurology: Awake and alert, non focal  E ENT: mild pallor, no icterus, oral mucosa moist Cardiovascular: No JVD. S1-S2 present, rhythmic, no gallops, rubs, or murmurs. No lower extremity edema. Pulmonary: positive breath sounds bilaterally, adequate air movement, no wheezing, rhonchi or rales. Gastrointestinal. Abdomen mild distended but not tender, 2 drains in placed right upper quadrant and cholecystostomy tube in place Skin. No rashes Musculoskeletal: no joint deformities     Data Reviewed: I have personally reviewed following labs and imaging studies  CBC: Recent Labs  Lab 10/19/20 0554 10/20/20 0633 10/23/20 1122 10/24/20 0131 10/25/20 0226  WBC 18.2* 17.4* 18.0* 16.5* 26.2*  NEUTROABS 16.0* 14.6* 15.5*  --   --   HGB 12.5* 10.8* 10.3* 10.0* 11.1*  HCT 36.7* 31.9* 31.2* 29.3* 34.2*  MCV 96.1 96.7 97.8  95.1 97.7  PLT 148* 160 242 278 [id number]   Basic Metabolic Panel: Recent Labs  Lab 10/19/20 0554 10/20/20 0633 10/23/20 1122 10/24/20 0131 10/25/20 0226  NA 130* 132* 133* 133* 134*  K 3.3* 3.7 3.8 3.5 4.0  CL 98 100 99 101 100  CO2 '23 25 27 24 24  '$ GLUCOSE 154* 180* 193* 164* 169*  BUN 52* 45* 31* 25* 22  CREATININE 1.84* 1.71* 1.67* 1.46* 1.66*  CALCIUM 7.7* 7.6* 8.2* 7.9* 8.2*   GFR: Estimated Creatinine Clearance: 56.3 mL/min (A) (by C-G formula based on SCr of 1.66 mg/dL (H)). Liver Function Tests: Recent Labs  Lab 10/19/20 0554 10/23/20 1122 10/24/20 0131  AST 36 31 34  ALT 52* 38 41  ALKPHOS 74 74 69  BILITOT 1.0 0.7 0.7  PROT 5.7* 5.4* 5.0*  ALBUMIN 2.1* 1.9* 1.8*   No results for  input(s): LIPASE, AMYLASE in the last 168 hours. No results for input(s): AMMONIA in the last 168 hours. Coagulation Profile: No results for input(s): INR, PROTIME in the last 168 hours. Cardiac Enzymes: No results for input(s): CKTOTAL, CKMB, CKMBINDEX, TROPONINI in the last 168 hours. BNP (last 3 results) No results for input(s): PROBNP in the last 8760 hours. HbA1C: Recent Labs    10/23/20 1318  HGBA1C 7.6*   CBG: Recent Labs  Lab 10/24/20 1215 10/24/20 1825 10/24/20 2111 10/25/20 0825 10/25/20 1133  GLUCAP 122* 108* 130* 164* 153*   Lipid Profile: No results for input(s): CHOL, HDL, LDLCALC, TRIG, CHOLHDL, LDLDIRECT in the last 72 hours. Thyroid Function Tests: No results for input(s): TSH, T4TOTAL, FREET4, T3FREE, THYROIDAB in the last 72 hours. Anemia Panel: No results for input(s): VITAMINB12, FOLATE, FERRITIN, TIBC, IRON, RETICCTPCT in the last 72 hours.    Radiology Studies: I have reviewed all of the imaging during this hospital visit personally     Scheduled Meds:  amLODipine  5 mg Oral Daily   aspirin EC  81 mg Oral Daily   enoxaparin (LOVENOX) injection  40 mg Subcutaneous Q24H   famotidine  40 mg Oral QHS   insulin aspart  0-15 Units Subcutaneous TID WC   insulin glargine-yfgn  8 Units Subcutaneous QHS   oxybutynin  5 mg Oral BID   pantoprazole  40 mg Oral Daily   saccharomyces boulardii  250 mg Oral BID   simvastatin  20 mg Oral QHS   sodium chloride flush  5 mL Intracatheter Q8H   tamsulosin  0.4 mg Oral BID PC   Continuous Infusions:  sodium chloride 75 mL/hr at 10/25/20 0700   piperacillin-tazobactam (ZOSYN)  IV 12.5 mL/hr at 10/25/20 0700     LOS: 2 days        Akiel Fennell Gerome Apley, MD

## 2020-10-25 NOTE — Progress Notes (Signed)
Chief Complaint: Patient was seen today for perc chole and abscess drains  Referring Physician(s): * No referring provider recorded for this case *  Supervising Physician: Jacqulynn Cadet  Patient Status: [facility] - In-pt  Subjective: S/p perc chole and 2 additional hepatic and perihepatic abscess drains yesterday. States he's feeling much better today. Some soreness at drain sites as expected. Wife at bedside.  Objective: Physical Exam: BP 129/62 (BP Location: Left Arm)   Pulse 72   Temp (!) 97.3 F (36.3 C) (Oral)   Resp 15   SpO2 95%  Perc chole drain intact, bilious output in gravity bag Additional drains intact, sites clean, output thin mostly serous    Current Facility-Administered Medications:    0.9 %  sodium chloride infusion, , Intravenous, Continuous, Arrien, Jimmy Picket, MD, Last Rate: 75 mL/hr at 10/25/20 0700, Infusion Verify at 10/25/20 0700   amLODipine (NORVASC) tablet 5 mg, 5 mg, Oral, Daily, Arrien, Jimmy Picket, MD, 5 mg at 10/25/20 0913   aspirin EC tablet 81 mg, 81 mg, Oral, Daily, Wynetta Fines T, MD, 81 mg at 10/25/20 0913   enoxaparin (LOVENOX) injection 40 mg, 40 mg, Subcutaneous, Q24H, Wynetta Fines T, MD   famotidine (PEPCID) tablet 40 mg, 40 mg, Oral, QHS, Wynetta Fines T, MD, 40 mg at 10/24/20 2140   hydrALAZINE (APRESOLINE) tablet 25 mg, 25 mg, Oral, Q6H PRN, Wynetta Fines T, MD, 25 mg at 10/23/20 2330   HYDROmorphone (DILAUDID) injection 0.5-1 mg, 0.5-1 mg, Intravenous, Q2H PRN, Wynetta Fines T, MD, 1 mg at 10/25/20 0615   insulin aspart (novoLOG) injection 0-15 Units, 0-15 Units, Subcutaneous, TID WC, Lequita Halt, MD, 3 Units at 10/25/20 0840   insulin glargine-yfgn (SEMGLEE) injection 8 Units, 8 Units, Subcutaneous, QHS, Arrien, Jimmy Picket, MD, 8 Units at 10/24/20 2144   ondansetron (ZOFRAN) tablet 4 mg, 4 mg, Oral, Q6H PRN **OR** ondansetron (ZOFRAN) injection 4 mg, 4 mg, Intravenous, Q6H PRN, Wynetta Fines T, MD   oxybutynin (DITROPAN)  tablet 5 mg, 5 mg, Oral, BID, Wynetta Fines T, MD, 5 mg at 10/25/20 0913   pantoprazole (PROTONIX) EC tablet 40 mg, 40 mg, Oral, Daily, Wynetta Fines T, MD, 40 mg at 10/25/20 0913   piperacillin-tazobactam (ZOSYN) IVPB 3.375 g, 3.375 g, Intravenous, Q8H, Wynetta Fines T, MD, Last Rate: 12.5 mL/hr at 10/25/20 0700, Infusion Verify at 10/25/20 0700   saccharomyces boulardii (FLORASTOR) capsule 250 mg, 250 mg, Oral, BID, Wynetta Fines T, MD, 250 mg at 10/25/20 0913   simvastatin (ZOCOR) tablet 20 mg, 20 mg, Oral, QHS, Wynetta Fines T, MD, 20 mg at 10/24/20 2140   sodium chloride flush (NS) 0.9 % injection 5 mL, 5 mL, Intracatheter, Q8H, Aletta Edouard, MD, 5 mL at 10/25/20 0558   tamsulosin (FLOMAX) capsule 0.4 mg, 0.4 mg, Oral, BID PC, Wynetta Fines T, MD, 0.4 mg at 10/25/20 0913   traMADol (ULTRAM) tablet 50 mg, 50 mg, Oral, Q12H PRN, Wynetta Fines T, MD, 50 mg at 10/24/20 2021  Labs: CBC Recent Labs    10/24/20 0131 10/25/20 0226  WBC 16.5* 26.2*  HGB 10.0* 11.1*  HCT 29.3* 34.2*  PLT 278 331   BMET Recent Labs    10/24/20 0131 10/25/20 0226  NA 133* 134*  K 3.5 4.0  CL 101 100  CO2 24 24  GLUCOSE 164* 169*  BUN 25* 22  CREATININE 1.46* 1.66*  CALCIUM 7.9* 8.2*   LFT Recent Labs    10/24/20 0131  PROT 5.0*  ALBUMIN  1.8*  AST 34  ALT 41  ALKPHOS 69  BILITOT 0.7   PT/INR No results for input(s): LABPROT, INR in the last 72 hours.   Studies/Results: CT Abdomen Pelvis W Contrast  Result Date: 10/23/2020 CLINICAL DATA:  Complicated cholecystitis. EXAM: CT ABDOMEN AND PELVIS WITH CONTRAST TECHNIQUE: Multidetector CT imaging of the abdomen and pelvis was performed using the standard protocol following bolus administration of intravenous contrast. CONTRAST:  43m OMNIPAQUE IOHEXOL 350 MG/ML SOLN COMPARISON:  Nuclear medicine hepatobiliary study of 10/23/2020. Abdominal ultrasound 10/22/2020. Most recent CT 10/15/2020. FINDINGS: Lower chest: volume loss in the anterior right lung base.  Right lower lobe collapse/consolidative change. Moderate right pleural effusion, increased. Tiny left pleural effusion, similar. Mild cardiomegaly with 3 vessel coronary artery calcification. Hepatobiliary: Slight improvement in gallbladder distension with wall thickening. Improved pericholecystic edema. No calcified stone. No biliary duct dilatation. The perihepatic complex hepatic lesion measures 5.8 x 5.3 cm on 22/3 versus 5.6 x 5.6 cm on the prior exam (when remeasured). A perihepatic 3.3 cm fluid collection on 32/3 is similar to on the prior exam (when remeasured). More caudal perihepatic collection is new at 7.6 x 4.2 cm on 35/3. Lateral right perihepatic space collection or collections at the site of trace ascites on the prior exam. These may all be contiguous, with the largest collection measuring 8.1 x 2.3 cm on 18/3. Pancreas: Fatty replacement through the pancreatic body. No duct dilatation or acute inflammation. Spleen: Normal in size, without focal abnormality. Adrenals/Urinary Tract: Normal adrenal glands. Mild renal cortical thinning bilaterally. 1.4 cm left renal cyst. The bladder is thick walled with mild pericystic edema on 87/3. Stomach/Bowel: Proximal gastric underdistention. Descending duodenal diverticulum. Otherwise normal small bowel. Extensive colonic diverticulosis. Colonic stool burden suggests constipation. Normal terminal ileum. The appendix is retrocecal, without surrounding inflammation. Vascular/Lymphatic: Separate common hepatic and splenic artery origins. Aortic atherosclerosis. No abdominopelvic adenopathy. Reproductive: Radiation seeds in the prostate. Other: No significant free fluid. No free intraperitoneal air. Anasarca, specially about the low chest. Musculoskeletal: No acute osseous abnormality. Lower thoracic spondylosis. IMPRESSION: 1. Decrease in pericholecystic edema, suggesting slightly improved cholecystitis. Complex intraparenchymal pericholecystic liver lesion is  relatively similar in size, most likely abscess. Multiple capsular based fluid collections are new since the prior. One collection is unchanged. 2. Bladder wall thickening, suspicious for cystitis. This is likely accentuated by underdistention. 3. Increase in moderate right pleural effusion with adjacent consolidation which could represent atelectasis or infection. Trace left pleural fluid is not significantly changed. 4. Coronary artery atherosclerosis. Aortic Atherosclerosis (ICD10-I70.0). 5.  Possible constipation. Electronically Signed   By: KAbigail MiyamotoM.D.   On: 10/23/2020 17:39   IR Guided Drain W Catheter Placement  Result Date: 10/24/2020 INDICATION: Acute cholecystitis, worsening perihepatic fluid collections and intrahepatic fluid collections near the gallbladder fossa. EXAM: 1. PERCUTANEOUS CHOLECYSTOSTOMY TUBE PLACEMENT WITH ULTRASOUND AND FLUOROSCOPIC GUIDANCE 2. PERCUTANEOUS CATHETER DRAINAGE OF INFRAHEPATIC ABSCESS WITH ULTRASOUND AND FLUOROSCOPIC GUIDANCE 3. PERCUTANEOUS CATHETER DRAINAGE SUPERIOR PERIHEPATIC ABSCESS WITH ULTRASOUND AND FLUOROSCOPIC GUIDANCE 4. PERCUTANEOUS NEEDLE ASPIRATION OF HEPATIC ABSCESSES WITH ULTRASOUND GUIDANCE MEDICATIONS: None ANESTHESIA/SEDATION: Moderate (conscious) sedation was employed during this procedure. A total of Versed 3.0 mg and Fentanyl 150 mcg was administered intravenously. Moderate Sedation Time: 47 minutes. The patient's level of consciousness and vital signs were monitored continuously by radiology nursing throughout the procedure under my direct supervision. FLUOROSCOPY TIME:  Fluoroscopy Time: 1.0 minutes.  74 mGy. CONTRAST:  10 mL Omnipaque [id number]: None immediate. PROCEDURE: Informed written consent was obtained from  the patient after a thorough discussion of the procedural risks, benefits and alternatives. All questions were addressed. Maximal Sterile Barrier Technique was utilized including caps, mask, sterile gowns, sterile gloves,  sterile drape, hand hygiene and skin antiseptic. A timeout was performed prior to the initiation of the procedure. Initial ultrasound was performed to localize the gallbladder, perihepatic fluid collections and intrahepatic fluid collections. Under ultrasound guidance, a 21 gauge needle was advanced into the gallbladder lumen via a transhepatic approach. After confirming needle tip position, a guidewire was advanced into the gallbladder lumen and a transitional dilator advanced over the wire. The percutaneous tract was dilated over a guidewire and a 10 [place] multipurpose drainage catheter advanced into the gallbladder lumen. Contrast was injected via the drain to confirm position within the gallbladder lumen and the cholecystostomy tube was attached to a gravity drainage bag. Fluid collection just inferior to the tip of the right lobe of the liver was then addressed with advancement of an 18 gauge trocar needle under ultrasound guidance. Aspiration of fluid was performed followed by guidewire advancement, percutaneous tract dilatation and placement of a 10 French drainage catheter. Catheter position was confirmed by fluoroscopy. A fluid sample was withdrawn and sent for culture analysis. The drainage catheter was attached to suction bulb drainage. A separate superior perihepatic fluid collection was then addressed with advancement of an 18 gauge trocar needle under ultrasound guidance. Aspiration of fluid was performed followed by guidewire advancement, percutaneous tract dilatation and placement of a 10 French drainage catheter. Catheter position was confirmed by fluoroscopy. A fluid sample was withdrawn and sent for culture analysis. The drainage catheter was attached to suction bulb drainage. An 18 gauge trocar needle was advanced under ultrasound guidance into the right lobe of the liver adjacent to the gallbladder fossa. Aspiration of several small focal adjacent fluid collections were performed under  ultrasound guidance. A fluid sample was sent for culture analysis. The percutaneous cholecystostomy tube and additional 2 separate perihepatic drainage catheters were then secured at the skin with Prolene retention sutures and StatLock devices. FINDINGS: After placement of the cholecystostomy tube, there is return bile. Fluid aspiration of the inferior perihepatic fluid collection yielded fairly clear appearing bilious fluid. A drainage catheter was placed into this collection given size of the collection as well as somewhat limited initial fluid return from the 18 gauge needle. Fluid aspiration of the superior perihepatic fluid collection yielded turbid appearing bilious fluid. A drainage catheter was placed given turbid nature of fluid. Aspiration at the level of small fluid collections adjacent to the gallbladder within the liver yielded a small amount of bloody and bilious fluid. A total of approximately 2-3 mL of fluid was able to be aspirated from these small collections and sent for culture analysis. No dominant abscess in the liver is identified to warrant additional drain placement within the liver. IMPRESSION: 1. Placement of 10 French percutaneous cholecystostomy tube to treat acute cholecystitis. The cholecystostomy tube was attached to gravity bag drainage. 2. Percutaneous catheter drainage infra hepatic fluid collection yielding bilious fluid. A 10 French drain was placed and attached to suction bulb drainage. A sample of bilious fluid was sent for culture analysis. 3. Percutaneous catheter drainage of superior perihepatic fluid collection yielding turbid bilious fluid. A 10 French drain was placed and attached to suction bulb drainage. A sample of bilious fluid was sent for culture analysis. 4. Ultrasound-guided aspiration of small intrahepatic fluid collections adjacent to the gallbladder fossa. This yielded bloody and bilious fluid with a 2-3  mL sample sent for culture analysis. Electronically  Signed   By: Aletta Edouard M.D.   On: 10/24/2020 17:49   IR Guided Drain W Catheter Placement  Result Date: 10/24/2020 INDICATION: Acute cholecystitis, worsening perihepatic fluid collections and intrahepatic fluid collections near the gallbladder fossa. EXAM: 1. PERCUTANEOUS CHOLECYSTOSTOMY TUBE PLACEMENT WITH ULTRASOUND AND FLUOROSCOPIC GUIDANCE 2. PERCUTANEOUS CATHETER DRAINAGE OF INFRAHEPATIC ABSCESS WITH ULTRASOUND AND FLUOROSCOPIC GUIDANCE 3. PERCUTANEOUS CATHETER DRAINAGE SUPERIOR PERIHEPATIC ABSCESS WITH ULTRASOUND AND FLUOROSCOPIC GUIDANCE 4. PERCUTANEOUS NEEDLE ASPIRATION OF HEPATIC ABSCESSES WITH ULTRASOUND GUIDANCE MEDICATIONS: None ANESTHESIA/SEDATION: Moderate (conscious) sedation was employed during this procedure. A total of Versed 3.0 mg and Fentanyl 150 mcg was administered intravenously. Moderate Sedation Time: 47 minutes. The patient's level of consciousness and vital signs were monitored continuously by radiology nursing throughout the procedure under my direct supervision. FLUOROSCOPY TIME:  Fluoroscopy Time: 1.0 minutes.  74 mGy. CONTRAST:  10 mL Omnipaque [id number] COMPLICATIONS: None immediate. PROCEDURE: Informed written consent was obtained from the patient after a thorough discussion of the procedural risks, benefits and alternatives. All questions were addressed. Maximal Sterile Barrier Technique was utilized including caps, mask, sterile gowns, sterile gloves, sterile drape, hand hygiene and skin antiseptic. A timeout was performed prior to the initiation of the procedure. Initial ultrasound was performed to localize the gallbladder, perihepatic fluid collections and intrahepatic fluid collections. Under ultrasound guidance, a 21 gauge needle was advanced into the gallbladder lumen via a transhepatic approach. After confirming needle tip position, a guidewire was advanced into the gallbladder lumen and a transitional dilator advanced over the wire. The percutaneous tract was dilated  over a guidewire and a 10 [place] multipurpose drainage catheter advanced into the gallbladder lumen. Contrast was injected via the drain to confirm position within the gallbladder lumen and the cholecystostomy tube was attached to a gravity drainage bag. Fluid collection just inferior to the tip of the right lobe of the liver was then addressed with advancement of an 18 gauge trocar needle under ultrasound guidance. Aspiration of fluid was performed followed by guidewire advancement, percutaneous tract dilatation and placement of a 10 French drainage catheter. Catheter position was confirmed by fluoroscopy. A fluid sample was withdrawn and sent for culture analysis. The drainage catheter was attached to suction bulb drainage. A separate superior perihepatic fluid collection was then addressed with advancement of an 18 gauge trocar needle under ultrasound guidance. Aspiration of fluid was performed followed by guidewire advancement, percutaneous tract dilatation and placement of a 10 French drainage catheter. Catheter position was confirmed by fluoroscopy. A fluid sample was withdrawn and sent for culture analysis. The drainage catheter was attached to suction bulb drainage. An 18 gauge trocar needle was advanced under ultrasound guidance into the right lobe of the liver adjacent to the gallbladder fossa. Aspiration of several small focal adjacent fluid collections were performed under ultrasound guidance. A fluid sample was sent for culture analysis. The percutaneous cholecystostomy tube and additional 2 separate perihepatic drainage catheters were then secured at the skin with Prolene retention sutures and StatLock devices. FINDINGS: After placement of the cholecystostomy tube, there is return bile. Fluid aspiration of the inferior perihepatic fluid collection yielded fairly clear appearing bilious fluid. A drainage catheter was placed into this collection given size of the collection as well as somewhat limited  initial fluid return from the 18 gauge needle. Fluid aspiration of the superior perihepatic fluid collection yielded turbid appearing bilious fluid. A drainage catheter was placed given turbid nature of fluid. Aspiration at the level of  small fluid collections adjacent to the gallbladder within the liver yielded a small amount of bloody and bilious fluid. A total of approximately 2-3 mL of fluid was able to be aspirated from these small collections and sent for culture analysis. No dominant abscess in the liver is identified to warrant additional drain placement within the liver. IMPRESSION: 1. Placement of 10 French percutaneous cholecystostomy tube to treat acute cholecystitis. The cholecystostomy tube was attached to gravity bag drainage. 2. Percutaneous catheter drainage infra hepatic fluid collection yielding bilious fluid. A 10 French drain was placed and attached to suction bulb drainage. A sample of bilious fluid was sent for culture analysis. 3. Percutaneous catheter drainage of superior perihepatic fluid collection yielding turbid bilious fluid. A 10 French drain was placed and attached to suction bulb drainage. A sample of bilious fluid was sent for culture analysis. 4. Ultrasound-guided aspiration of small intrahepatic fluid collections adjacent to the gallbladder fossa. This yielded bloody and bilious fluid with a 2-3 mL sample sent for culture analysis. Electronically Signed   By: Aletta Edouard M.D.   On: 10/24/2020 17:49   NM Hepato W/EF  Result Date: 10/23/2020 CLINICAL DATA:  Concern for cholecystitis. EXAM: NUCLEAR MEDICINE HEPATOBILIARY IMAGING TECHNIQUE: Sequential images of the abdomen were obtained out to 60 minutes following intravenous administration of radiopharmaceutical. RADIOPHARMACEUTICALS:  5.3 mCi Tc-42m Choletec IV COMPARISON:  CT scan 10/15/2020 FINDINGS: Prompt clearance radiotracer from blood pool and homogeneous uptake in liver. Counts are evident in the common bile duct by  20 minutes. Counts continue to fill the small bowel. The gallbladder is not identified at 60 minutes. 3 mg of IV morphine was administered to augment filling of the gallbladder. No filling of the gallbladder following morphine administration. IMPRESSION: Non filling of the gallbladder is consistent with acute cholecystitis. These results will be called to the ordering clinician or representative by the Radiologist Assistant, and communication documented in the PACS or [employer] Electronically Signed   By: SSuzy BouchardM.D.   On: 10/23/2020 13:11   IR Perc Cholecystostomy  Result Date: 10/24/2020 INDICATION: Acute cholecystitis, worsening perihepatic fluid collections and intrahepatic fluid collections near the gallbladder fossa. EXAM: 1. PERCUTANEOUS CHOLECYSTOSTOMY TUBE PLACEMENT WITH ULTRASOUND AND FLUOROSCOPIC GUIDANCE 2. PERCUTANEOUS CATHETER DRAINAGE OF INFRAHEPATIC ABSCESS WITH ULTRASOUND AND FLUOROSCOPIC GUIDANCE 3. PERCUTANEOUS CATHETER DRAINAGE SUPERIOR PERIHEPATIC ABSCESS WITH ULTRASOUND AND FLUOROSCOPIC GUIDANCE 4. PERCUTANEOUS NEEDLE ASPIRATION OF HEPATIC ABSCESSES WITH ULTRASOUND GUIDANCE MEDICATIONS: None ANESTHESIA/SEDATION: Moderate (conscious) sedation was employed during this procedure. A total of Versed 3.0 mg and Fentanyl 150 mcg was administered intravenously. Moderate Sedation Time: 47 minutes. The patient's level of consciousness and vital signs were monitored continuously by radiology nursing throughout the procedure under my direct supervision. FLUOROSCOPY TIME:  Fluoroscopy Time: 1.0 minutes.  74 mGy. CONTRAST:  10 mL Omnipaque [id number]: None immediate. PROCEDURE: Informed written consent was obtained from the patient after a thorough discussion of the procedural risks, benefits and alternatives. All questions were addressed. Maximal Sterile Barrier Technique was utilized including caps, mask, sterile gowns, sterile gloves, sterile drape, hand hygiene and skin  antiseptic. A timeout was performed prior to the initiation of the procedure. Initial ultrasound was performed to localize the gallbladder, perihepatic fluid collections and intrahepatic fluid collections. Under ultrasound guidance, a 21 gauge needle was advanced into the gallbladder lumen via a transhepatic approach. After confirming needle tip position, a guidewire was advanced into the gallbladder lumen and a transitional dilator advanced over the wire. The percutaneous tract was dilated  over a guidewire and a 10 [place] multipurpose drainage catheter advanced into the gallbladder lumen. Contrast was injected via the drain to confirm position within the gallbladder lumen and the cholecystostomy tube was attached to a gravity drainage bag. Fluid collection just inferior to the tip of the right lobe of the liver was then addressed with advancement of an 18 gauge trocar needle under ultrasound guidance. Aspiration of fluid was performed followed by guidewire advancement, percutaneous tract dilatation and placement of a 10 French drainage catheter. Catheter position was confirmed by fluoroscopy. A fluid sample was withdrawn and sent for culture analysis. The drainage catheter was attached to suction bulb drainage. A separate superior perihepatic fluid collection was then addressed with advancement of an 18 gauge trocar needle under ultrasound guidance. Aspiration of fluid was performed followed by guidewire advancement, percutaneous tract dilatation and placement of a 10 French drainage catheter. Catheter position was confirmed by fluoroscopy. A fluid sample was withdrawn and sent for culture analysis. The drainage catheter was attached to suction bulb drainage. An 18 gauge trocar needle was advanced under ultrasound guidance into the right lobe of the liver adjacent to the gallbladder fossa. Aspiration of several small focal adjacent fluid collections were performed under ultrasound guidance. A fluid sample was sent  for culture analysis. The percutaneous cholecystostomy tube and additional 2 separate perihepatic drainage catheters were then secured at the skin with Prolene retention sutures and StatLock devices. FINDINGS: After placement of the cholecystostomy tube, there is return bile. Fluid aspiration of the inferior perihepatic fluid collection yielded fairly clear appearing bilious fluid. A drainage catheter was placed into this collection given size of the collection as well as somewhat limited initial fluid return from the 18 gauge needle. Fluid aspiration of the superior perihepatic fluid collection yielded turbid appearing bilious fluid. A drainage catheter was placed given turbid nature of fluid. Aspiration at the level of small fluid collections adjacent to the gallbladder within the liver yielded a small amount of bloody and bilious fluid. A total of approximately 2-3 mL of fluid was able to be aspirated from these small collections and sent for culture analysis. No dominant abscess in the liver is identified to warrant additional drain placement within the liver. IMPRESSION: 1. Placement of 10 French percutaneous cholecystostomy tube to treat acute cholecystitis. The cholecystostomy tube was attached to gravity bag drainage. 2. Percutaneous catheter drainage infra hepatic fluid collection yielding bilious fluid. A 10 French drain was placed and attached to suction bulb drainage. A sample of bilious fluid was sent for culture analysis. 3. Percutaneous catheter drainage of superior perihepatic fluid collection yielding turbid bilious fluid. A 10 French drain was placed and attached to suction bulb drainage. A sample of bilious fluid was sent for culture analysis. 4. Ultrasound-guided aspiration of small intrahepatic fluid collections adjacent to the gallbladder fossa. This yielded bloody and bilious fluid with a 2-3 mL sample sent for culture analysis. Electronically Signed   By: Aletta Edouard M.D.   On: 10/24/2020  17:49   IR US Guide Bx Asp/Drain  Result Date: 10/24/2020 INDICATION: Acute cholecystitis, worsening perihepatic fluid collections and intrahepatic fluid collections near the gallbladder fossa. EXAM: 1. PERCUTANEOUS CHOLECYSTOSTOMY TUBE PLACEMENT WITH ULTRASOUND AND FLUOROSCOPIC GUIDANCE 2. PERCUTANEOUS CATHETER DRAINAGE OF INFRAHEPATIC ABSCESS WITH ULTRASOUND AND FLUOROSCOPIC GUIDANCE 3. PERCUTANEOUS CATHETER DRAINAGE SUPERIOR PERIHEPATIC ABSCESS WITH ULTRASOUND AND FLUOROSCOPIC GUIDANCE 4. PERCUTANEOUS NEEDLE ASPIRATION OF HEPATIC ABSCESSES WITH ULTRASOUND GUIDANCE MEDICATIONS: None ANESTHESIA/SEDATION: Moderate (conscious) sedation was employed during this procedure. A total of Versed  3.0 mg and Fentanyl 150 mcg was administered intravenously. Moderate Sedation Time: 47 minutes. The patient's level of consciousness and vital signs were monitored continuously by radiology nursing throughout the procedure under my direct supervision. FLUOROSCOPY TIME:  Fluoroscopy Time: 1.0 minutes.  74 mGy. CONTRAST:  10 mL Omnipaque [id number] COMPLICATIONS: None immediate. PROCEDURE: Informed written consent was obtained from the patient after a thorough discussion of the procedural risks, benefits and alternatives. All questions were addressed. Maximal Sterile Barrier Technique was utilized including caps, mask, sterile gowns, sterile gloves, sterile drape, hand hygiene and skin antiseptic. A timeout was performed prior to the initiation of the procedure. Initial ultrasound was performed to localize the gallbladder, perihepatic fluid collections and intrahepatic fluid collections. Under ultrasound guidance, a 21 gauge needle was advanced into the gallbladder lumen via a transhepatic approach. After confirming needle tip position, a guidewire was advanced into the gallbladder lumen and a transitional dilator advanced over the wire. The percutaneous tract was dilated over a guidewire and a 10 [place] multipurpose drainage catheter  advanced into the gallbladder lumen. Contrast was injected via the drain to confirm position within the gallbladder lumen and the cholecystostomy tube was attached to a gravity drainage bag. Fluid collection just inferior to the tip of the right lobe of the liver was then addressed with advancement of an 18 gauge trocar needle under ultrasound guidance. Aspiration of fluid was performed followed by guidewire advancement, percutaneous tract dilatation and placement of a 10 French drainage catheter. Catheter position was confirmed by fluoroscopy. A fluid sample was withdrawn and sent for culture analysis. The drainage catheter was attached to suction bulb drainage. A separate superior perihepatic fluid collection was then addressed with advancement of an 18 gauge trocar needle under ultrasound guidance. Aspiration of fluid was performed followed by guidewire advancement, percutaneous tract dilatation and placement of a 10 French drainage catheter. Catheter position was confirmed by fluoroscopy. A fluid sample was withdrawn and sent for culture analysis. The drainage catheter was attached to suction bulb drainage. An 18 gauge trocar needle was advanced under ultrasound guidance into the right lobe of the liver adjacent to the gallbladder fossa. Aspiration of several small focal adjacent fluid collections were performed under ultrasound guidance. A fluid sample was sent for culture analysis. The percutaneous cholecystostomy tube and additional 2 separate perihepatic drainage catheters were then secured at the skin with Prolene retention sutures and StatLock devices. FINDINGS: After placement of the cholecystostomy tube, there is return bile. Fluid aspiration of the inferior perihepatic fluid collection yielded fairly clear appearing bilious fluid. A drainage catheter was placed into this collection given size of the collection as well as somewhat limited initial fluid return from the 18 gauge needle. Fluid aspiration of  the superior perihepatic fluid collection yielded turbid appearing bilious fluid. A drainage catheter was placed given turbid nature of fluid. Aspiration at the level of small fluid collections adjacent to the gallbladder within the liver yielded a small amount of bloody and bilious fluid. A total of approximately 2-3 mL of fluid was able to be aspirated from these small collections and sent for culture analysis. No dominant abscess in the liver is identified to warrant additional drain placement within the liver. IMPRESSION: 1. Placement of 10 French percutaneous cholecystostomy tube to treat acute cholecystitis. The cholecystostomy tube was attached to gravity bag drainage. 2. Percutaneous catheter drainage infra hepatic fluid collection yielding bilious fluid. A 10 French drain was placed and attached to suction bulb drainage. A sample of bilious fluid was  sent for culture analysis. 3. Percutaneous catheter drainage of superior perihepatic fluid collection yielding turbid bilious fluid. A 10 French drain was placed and attached to suction bulb drainage. A sample of bilious fluid was sent for culture analysis. 4. Ultrasound-guided aspiration of small intrahepatic fluid collections adjacent to the gallbladder fossa. This yielded bloody and bilious fluid with a 2-3 mL sample sent for culture analysis. Electronically Signed   By: Aletta Edouard M.D.   On: 10/24/2020 17:49   IR US Guide Bx Asp/Drain  Result Date: 10/24/2020 INDICATION: Acute cholecystitis, worsening perihepatic fluid collections and intrahepatic fluid collections near the gallbladder fossa. EXAM: 1. PERCUTANEOUS CHOLECYSTOSTOMY TUBE PLACEMENT WITH ULTRASOUND AND FLUOROSCOPIC GUIDANCE 2. PERCUTANEOUS CATHETER DRAINAGE OF INFRAHEPATIC ABSCESS WITH ULTRASOUND AND FLUOROSCOPIC GUIDANCE 3. PERCUTANEOUS CATHETER DRAINAGE SUPERIOR PERIHEPATIC ABSCESS WITH ULTRASOUND AND FLUOROSCOPIC GUIDANCE 4. PERCUTANEOUS NEEDLE ASPIRATION OF HEPATIC ABSCESSES WITH  ULTRASOUND GUIDANCE MEDICATIONS: None ANESTHESIA/SEDATION: Moderate (conscious) sedation was employed during this procedure. A total of Versed 3.0 mg and Fentanyl 150 mcg was administered intravenously. Moderate Sedation Time: 47 minutes. The patient's level of consciousness and vital signs were monitored continuously by radiology nursing throughout the procedure under my direct supervision. FLUOROSCOPY TIME:  Fluoroscopy Time: 1.0 minutes.  74 mGy. CONTRAST:  10 mL Omnipaque [id number] COMPLICATIONS: None immediate. PROCEDURE: Informed written consent was obtained from the patient after a thorough discussion of the procedural risks, benefits and alternatives. All questions were addressed. Maximal Sterile Barrier Technique was utilized including caps, mask, sterile gowns, sterile gloves, sterile drape, hand hygiene and skin antiseptic. A timeout was performed prior to the initiation of the procedure. Initial ultrasound was performed to localize the gallbladder, perihepatic fluid collections and intrahepatic fluid collections. Under ultrasound guidance, a 21 gauge needle was advanced into the gallbladder lumen via a transhepatic approach. After confirming needle tip position, a guidewire was advanced into the gallbladder lumen and a transitional dilator advanced over the wire. The percutaneous tract was dilated over a guidewire and a 10 [place] multipurpose drainage catheter advanced into the gallbladder lumen. Contrast was injected via the drain to confirm position within the gallbladder lumen and the cholecystostomy tube was attached to a gravity drainage bag. Fluid collection just inferior to the tip of the right lobe of the liver was then addressed with advancement of an 18 gauge trocar needle under ultrasound guidance. Aspiration of fluid was performed followed by guidewire advancement, percutaneous tract dilatation and placement of a 10 French drainage catheter. Catheter position was confirmed by fluoroscopy. A fluid  sample was withdrawn and sent for culture analysis. The drainage catheter was attached to suction bulb drainage. A separate superior perihepatic fluid collection was then addressed with advancement of an 18 gauge trocar needle under ultrasound guidance. Aspiration of fluid was performed followed by guidewire advancement, percutaneous tract dilatation and placement of a 10 French drainage catheter. Catheter position was confirmed by fluoroscopy. A fluid sample was withdrawn and sent for culture analysis. The drainage catheter was attached to suction bulb drainage. An 18 gauge trocar needle was advanced under ultrasound guidance into the right lobe of the liver adjacent to the gallbladder fossa. Aspiration of several small focal adjacent fluid collections were performed under ultrasound guidance. A fluid sample was sent for culture analysis. The percutaneous cholecystostomy tube and additional 2 separate perihepatic drainage catheters were then secured at the skin with Prolene retention sutures and StatLock devices. FINDINGS: After placement of the cholecystostomy tube, there is return bile. Fluid aspiration of the inferior perihepatic fluid collection yielded  fairly clear appearing bilious fluid. A drainage catheter was placed into this collection given size of the collection as well as somewhat limited initial fluid return from the 18 gauge needle. Fluid aspiration of the superior perihepatic fluid collection yielded turbid appearing bilious fluid. A drainage catheter was placed given turbid nature of fluid. Aspiration at the level of small fluid collections adjacent to the gallbladder within the liver yielded a small amount of bloody and bilious fluid. A total of approximately 2-3 mL of fluid was able to be aspirated from these small collections and sent for culture analysis. No dominant abscess in the liver is identified to warrant additional drain placement within the liver. IMPRESSION: 1. Placement of 10 French  percutaneous cholecystostomy tube to treat acute cholecystitis. The cholecystostomy tube was attached to gravity bag drainage. 2. Percutaneous catheter drainage infra hepatic fluid collection yielding bilious fluid. A 10 French drain was placed and attached to suction bulb drainage. A sample of bilious fluid was sent for culture analysis. 3. Percutaneous catheter drainage of superior perihepatic fluid collection yielding turbid bilious fluid. A 10 French drain was placed and attached to suction bulb drainage. A sample of bilious fluid was sent for culture analysis. 4. Ultrasound-guided aspiration of small intrahepatic fluid collections adjacent to the gallbladder fossa. This yielded bloody and bilious fluid with a 2-3 mL sample sent for culture analysis. Electronically Signed   By: Aletta Edouard M.D.   On: 10/24/2020 17:49   IR US Guide Bx Asp/Drain  Result Date: 10/24/2020 INDICATION: Acute cholecystitis, worsening perihepatic fluid collections and intrahepatic fluid collections near the gallbladder fossa. EXAM: 1. PERCUTANEOUS CHOLECYSTOSTOMY TUBE PLACEMENT WITH ULTRASOUND AND FLUOROSCOPIC GUIDANCE 2. PERCUTANEOUS CATHETER DRAINAGE OF INFRAHEPATIC ABSCESS WITH ULTRASOUND AND FLUOROSCOPIC GUIDANCE 3. PERCUTANEOUS CATHETER DRAINAGE SUPERIOR PERIHEPATIC ABSCESS WITH ULTRASOUND AND FLUOROSCOPIC GUIDANCE 4. PERCUTANEOUS NEEDLE ASPIRATION OF HEPATIC ABSCESSES WITH ULTRASOUND GUIDANCE MEDICATIONS: None ANESTHESIA/SEDATION: Moderate (conscious) sedation was employed during this procedure. A total of Versed 3.0 mg and Fentanyl 150 mcg was administered intravenously. Moderate Sedation Time: 47 minutes. The patient's level of consciousness and vital signs were monitored continuously by radiology nursing throughout the procedure under my direct supervision. FLUOROSCOPY TIME:  Fluoroscopy Time: 1.0 minutes.  74 mGy. CONTRAST:  10 mL Omnipaque [id number] COMPLICATIONS: None immediate. PROCEDURE: Informed written consent was  obtained from the patient after a thorough discussion of the procedural risks, benefits and alternatives. All questions were addressed. Maximal Sterile Barrier Technique was utilized including caps, mask, sterile gowns, sterile gloves, sterile drape, hand hygiene and skin antiseptic. A timeout was performed prior to the initiation of the procedure. Initial ultrasound was performed to localize the gallbladder, perihepatic fluid collections and intrahepatic fluid collections. Under ultrasound guidance, a 21 gauge needle was advanced into the gallbladder lumen via a transhepatic approach. After confirming needle tip position, a guidewire was advanced into the gallbladder lumen and a transitional dilator advanced over the wire. The percutaneous tract was dilated over a guidewire and a 10 [place] multipurpose drainage catheter advanced into the gallbladder lumen. Contrast was injected via the drain to confirm position within the gallbladder lumen and the cholecystostomy tube was attached to a gravity drainage bag. Fluid collection just inferior to the tip of the right lobe of the liver was then addressed with advancement of an 18 gauge trocar needle under ultrasound guidance. Aspiration of fluid was performed followed by guidewire advancement, percutaneous tract dilatation and placement of a 10 French drainage catheter. Catheter position was confirmed by fluoroscopy. A fluid sample was  withdrawn and sent for culture analysis. The drainage catheter was attached to suction bulb drainage. A separate superior perihepatic fluid collection was then addressed with advancement of an 18 gauge trocar needle under ultrasound guidance. Aspiration of fluid was performed followed by guidewire advancement, percutaneous tract dilatation and placement of a 10 French drainage catheter. Catheter position was confirmed by fluoroscopy. A fluid sample was withdrawn and sent for culture analysis. The drainage catheter was attached to suction  bulb drainage. An 18 gauge trocar needle was advanced under ultrasound guidance into the right lobe of the liver adjacent to the gallbladder fossa. Aspiration of several small focal adjacent fluid collections were performed under ultrasound guidance. A fluid sample was sent for culture analysis. The percutaneous cholecystostomy tube and additional 2 separate perihepatic drainage catheters were then secured at the skin with Prolene retention sutures and StatLock devices. FINDINGS: After placement of the cholecystostomy tube, there is return bile. Fluid aspiration of the inferior perihepatic fluid collection yielded fairly clear appearing bilious fluid. A drainage catheter was placed into this collection given size of the collection as well as somewhat limited initial fluid return from the 18 gauge needle. Fluid aspiration of the superior perihepatic fluid collection yielded turbid appearing bilious fluid. A drainage catheter was placed given turbid nature of fluid. Aspiration at the level of small fluid collections adjacent to the gallbladder within the liver yielded a small amount of bloody and bilious fluid. A total of approximately 2-3 mL of fluid was able to be aspirated from these small collections and sent for culture analysis. No dominant abscess in the liver is identified to warrant additional drain placement within the liver. IMPRESSION: 1. Placement of 10 French percutaneous cholecystostomy tube to treat acute cholecystitis. The cholecystostomy tube was attached to gravity bag drainage. 2. Percutaneous catheter drainage infra hepatic fluid collection yielding bilious fluid. A 10 French drain was placed and attached to suction bulb drainage. A sample of bilious fluid was sent for culture analysis. 3. Percutaneous catheter drainage of superior perihepatic fluid collection yielding turbid bilious fluid. A 10 French drain was placed and attached to suction bulb drainage. A sample of bilious fluid was sent for  culture analysis. 4. Ultrasound-guided aspiration of small intrahepatic fluid collections adjacent to the gallbladder fossa. This yielded bloody and bilious fluid with a 2-3 mL sample sent for culture analysis. Electronically Signed   By: Aletta Edouard M.D.   On: 10/24/2020 17:49   IR Fluoro Guide Ndl Plmt / BX  Result Date: 10/24/2020 INDICATION: Acute cholecystitis, worsening perihepatic fluid collections and intrahepatic fluid collections near the gallbladder fossa. EXAM: 1. PERCUTANEOUS CHOLECYSTOSTOMY TUBE PLACEMENT WITH ULTRASOUND AND FLUOROSCOPIC GUIDANCE 2. PERCUTANEOUS CATHETER DRAINAGE OF INFRAHEPATIC ABSCESS WITH ULTRASOUND AND FLUOROSCOPIC GUIDANCE 3. PERCUTANEOUS CATHETER DRAINAGE SUPERIOR PERIHEPATIC ABSCESS WITH ULTRASOUND AND FLUOROSCOPIC GUIDANCE 4. PERCUTANEOUS NEEDLE ASPIRATION OF HEPATIC ABSCESSES WITH ULTRASOUND GUIDANCE MEDICATIONS: None ANESTHESIA/SEDATION: Moderate (conscious) sedation was employed during this procedure. A total of Versed 3.0 mg and Fentanyl 150 mcg was administered intravenously. Moderate Sedation Time: 47 minutes. The patient's level of consciousness and vital signs were monitored continuously by radiology nursing throughout the procedure under my direct supervision. FLUOROSCOPY TIME:  Fluoroscopy Time: 1.0 minutes.  74 mGy. CONTRAST:  10 mL Omnipaque [id number] COMPLICATIONS: None immediate. PROCEDURE: Informed written consent was obtained from the patient after a thorough discussion of the procedural risks, benefits and alternatives. All questions were addressed. Maximal Sterile Barrier Technique was utilized including caps, mask, sterile gowns, sterile gloves, sterile drape, hand hygiene  and skin antiseptic. A timeout was performed prior to the initiation of the procedure. Initial ultrasound was performed to localize the gallbladder, perihepatic fluid collections and intrahepatic fluid collections. Under ultrasound guidance, a 21 gauge needle was advanced into the  gallbladder lumen via a transhepatic approach. After confirming needle tip position, a guidewire was advanced into the gallbladder lumen and a transitional dilator advanced over the wire. The percutaneous tract was dilated over a guidewire and a 10 [place] multipurpose drainage catheter advanced into the gallbladder lumen. Contrast was injected via the drain to confirm position within the gallbladder lumen and the cholecystostomy tube was attached to a gravity drainage bag. Fluid collection just inferior to the tip of the right lobe of the liver was then addressed with advancement of an 18 gauge trocar needle under ultrasound guidance. Aspiration of fluid was performed followed by guidewire advancement, percutaneous tract dilatation and placement of a 10 French drainage catheter. Catheter position was confirmed by fluoroscopy. A fluid sample was withdrawn and sent for culture analysis. The drainage catheter was attached to suction bulb drainage. A separate superior perihepatic fluid collection was then addressed with advancement of an 18 gauge trocar needle under ultrasound guidance. Aspiration of fluid was performed followed by guidewire advancement, percutaneous tract dilatation and placement of a 10 French drainage catheter. Catheter position was confirmed by fluoroscopy. A fluid sample was withdrawn and sent for culture analysis. The drainage catheter was attached to suction bulb drainage. An 18 gauge trocar needle was advanced under ultrasound guidance into the right lobe of the liver adjacent to the gallbladder fossa. Aspiration of several small focal adjacent fluid collections were performed under ultrasound guidance. A fluid sample was sent for culture analysis. The percutaneous cholecystostomy tube and additional 2 separate perihepatic drainage catheters were then secured at the skin with Prolene retention sutures and StatLock devices. FINDINGS: After placement of the cholecystostomy tube, there is return  bile. Fluid aspiration of the inferior perihepatic fluid collection yielded fairly clear appearing bilious fluid. A drainage catheter was placed into this collection given size of the collection as well as somewhat limited initial fluid return from the 18 gauge needle. Fluid aspiration of the superior perihepatic fluid collection yielded turbid appearing bilious fluid. A drainage catheter was placed given turbid nature of fluid. Aspiration at the level of small fluid collections adjacent to the gallbladder within the liver yielded a small amount of bloody and bilious fluid. A total of approximately 2-3 mL of fluid was able to be aspirated from these small collections and sent for culture analysis. No dominant abscess in the liver is identified to warrant additional drain placement within the liver. IMPRESSION: 1. Placement of 10 French percutaneous cholecystostomy tube to treat acute cholecystitis. The cholecystostomy tube was attached to gravity bag drainage. 2. Percutaneous catheter drainage infra hepatic fluid collection yielding bilious fluid. A 10 French drain was placed and attached to suction bulb drainage. A sample of bilious fluid was sent for culture analysis. 3. Percutaneous catheter drainage of superior perihepatic fluid collection yielding turbid bilious fluid. A 10 French drain was placed and attached to suction bulb drainage. A sample of bilious fluid was sent for culture analysis. 4. Ultrasound-guided aspiration of small intrahepatic fluid collections adjacent to the gallbladder fossa. This yielded bloody and bilious fluid with a 2-3 mL sample sent for culture analysis. Electronically Signed   By: Aletta Edouard M.D.   On: 10/24/2020 17:49    Assessment/Plan: Cholecystitis with hepatic /perihepatic abscesses S/p perc chole and 2  additional abscess drain placement yesterday Doing better clinically. Follow output, cultures. Hopefully diet to be advanced. IR following.    LOS: 2 days   I  spent a total of 20 minutes in face to face in clinical consultation, greater than 50% of which was counseling/coordinating care for perc chole and hepatic abscess drains  Ascencion Dike PA-C 10/25/2020 9:53 AM

## 2020-10-26 LAB — CBC WITH DIFFERENTIAL/PLATELET
Abs Immature Granulocytes: 0.27 10*3/uL — ABNORMAL HIGH (ref 0.00–0.07)
Basophils Absolute: 0.1 10*3/uL (ref 0.0–0.1)
Basophils Relative: 0 %
Eosinophils Absolute: 0.2 10*3/uL (ref 0.0–0.5)
Eosinophils Relative: 1 %
HCT: 28.5 % — ABNORMAL LOW (ref 39.0–52.0)
Hemoglobin: 9.5 g/dL — ABNORMAL LOW (ref 13.0–17.0)
Immature Granulocytes: 2 %
Lymphocytes Relative: 4 %
Lymphs Abs: 0.6 10*3/uL — ABNORMAL LOW (ref 0.7–4.0)
MCH: 31.9 pg (ref 26.0–34.0)
MCHC: 33.3 g/dL (ref 30.0–36.0)
MCV: 95.6 fL (ref 80.0–100.0)
Monocytes Absolute: 1.1 10*3/uL — ABNORMAL HIGH (ref 0.1–1.0)
Monocytes Relative: 7 %
Neutro Abs: 14.2 10*3/uL — ABNORMAL HIGH (ref 1.7–7.7)
Neutrophils Relative %: 86 %
Platelets: 333 10*3/uL (ref 150–400)
RBC: 2.98 MIL/uL — ABNORMAL LOW (ref 4.22–5.81)
RDW: 14 % (ref 11.5–15.5)
WBC: 16.4 10*3/uL — ABNORMAL HIGH (ref 4.0–10.5)
nRBC: 0 % (ref 0.0–0.2)

## 2020-10-26 LAB — GLUCOSE, CAPILLARY
Glucose-Capillary: 176 mg/dL — ABNORMAL HIGH (ref 70–99)
Glucose-Capillary: 203 mg/dL — ABNORMAL HIGH (ref 70–99)
Glucose-Capillary: 228 mg/dL — ABNORMAL HIGH (ref 70–99)
Glucose-Capillary: 187 mg/dL — ABNORMAL HIGH (ref 70–99)

## 2020-10-26 LAB — BASIC METABOLIC PANEL
Anion gap: 10 (ref 5–15)
BUN: 28 mg/dL — ABNORMAL HIGH (ref 8–23)
CO2: 23 mmol/L (ref 22–32)
Calcium: 8 mg/dL — ABNORMAL LOW (ref 8.9–10.3)
Chloride: 101 mmol/L (ref 98–111)
Creatinine, Ser: 1.91 mg/dL — ABNORMAL HIGH (ref 0.61–1.24)
GFR, Estimated: 36 mL/min — ABNORMAL LOW (ref >60)
Glucose, Bld: 176 mg/dL — ABNORMAL HIGH (ref 70–99)
Potassium: 3.9 mmol/L (ref 3.5–5.1)
Sodium: 134 mmol/L — ABNORMAL LOW (ref 135–145)

## 2020-10-26 MED ORDER — HYDROCHLOROTHIAZIDE 25 MG PO TABS
25.0000 mg | ORAL_TABLET | Freq: Every day | ORAL | Status: DC
  Administered 2020-10-26 – 2020-10-30 (×5): 25 mg via ORAL
  Filled 2020-10-26 (×5): qty 1

## 2020-10-26 NOTE — Progress Notes (Signed)
Occupational Therapy Evaluation Patient Details Name: James Glover MRN: [id number] DOB: [birth year] Today's Date: 10/26/2020    History of Present Illness The pt is a 75 yo male presenting 8/25 from SNF due to ultrasound and HIDA scan concerning for gangrenous cholecystitis and larger liver abscess. Pt reccently hospitalized 8/17-8/22 due to acute cholecystitis complicated by subcapsular live abscess and infection and d/c to SNF for short term rehab. PMH includes: aortic atherosclerosis, CKD III, HTN, DM II, and prostate cancer.   Clinical Impression   James Glover was evaluated s/p the above admission list. Prior to original admission, pt was completely indep in all ADL/IADLs. He lives at home with his wife, they have 42- STE and are able to stay on the main level. Pt is currently limited by pain and and generalized weakness with some balance concerns. He is completing mobility with RW given min A overall for powering up and balance in standing. His ADLs require up to mod A due to pain and poor activity tolerance. Pt would benefit from continued OT acutely. Recommend d/c to home with 24/7 supervision and [place].     Follow Up Recommendations  Home health OT;Supervision/Assistance - 24 hour    Equipment Recommendations   (RW and bari tall BSC)       Precautions / Restrictions Precautions Precautions: Fall;Other (comment) Precaution Comments: chole tube, x2 drains, watch O2 Restrictions Weight Bearing Restrictions: No      Mobility Bed Mobility Overal bed mobility: Needs Assistance Bed Mobility: Rolling;Sidelying to Sit;Sit to Sidelying Rolling: Min assist Sidelying to sit: Mod assist     Sit to sidelying: Min assist General bed mobility comments: OOB in chair    Transfers Overall transfer level: Needs assistance Equipment used: Rolling walker (2 wheeled) Transfers: Sit to/from Stand Sit to Stand: Min assist         General transfer comment: min A to boost into standing     Balance Overall balance assessment: Needs assistance Sitting-balance support: No upper extremity supported Sitting balance-[name] Scale: Good     Standing balance support: During functional activity;No upper extremity supported Standing balance-[name] Scale: Fair         ADL either performed or assessed with clinical judgement   ADL Overall ADL's : Needs assistance/impaired Eating/Feeding: Independent;Sitting   Grooming: Set up;Sitting   Upper Body Bathing: Minimal assistance;Sitting Upper Body Bathing Details (indicate cue type and reason): due to drains Lower Body Bathing: Moderate assistance;Sit to/from stand   Upper Body Dressing : Set up;Sitting   Lower Body Dressing: Moderate assistance;Sit to/from stand   Toilet Transfer: Minimal assistance;Ambulation;RW   Toileting- Clothing Manipulation and Hygiene: Min guard;Sitting/lateral lean       Functional mobility during ADLs: Minimal assistance;Rolling walker General ADL Comments: Pt required incrased assist for lower body tasks due to balance, pain and generalized weakness. min A for functional mobiliyt adn transfers for boosting and safety      Pertinent Vitals/Pain Pain Assessment: Faces Faces Pain Scale: Hurts a little bit Pain Location: abdomen Pain Descriptors / Indicators: Grimacing;Discomfort Pain Intervention(s): Limited activity within patient's tolerance;Monitored during session     Hand Dominance Right   Extremity/Trunk Assessment Upper Extremity Assessment Upper Extremity Assessment: Overall WFL for tasks assessed;Generalized weakness   Lower Extremity Assessment Lower Extremity Assessment: Defer to PT evaluation   Cervical / Trunk Assessment Cervical / Trunk Assessment: Normal   Communication Communication Communication: No difficulties   Cognition Arousal/Alertness: Awake/alert Behavior During Therapy: WFL for tasks assessed/performed Overall Cognitive Status: Within Functional Limits  for  tasks assessed                     General Comments  VSS on RA, pt reports fatigue from earlier therapy session. He feels as if he needs to be stronger, especially with sit<>stand prior to going home. He is still open to short SNF stay    Exercises Exercises: General Lower Extremity General Exercises - Lower Extremity Long Arc Quad: Both;10 reps;Seated   Shoulder Instructions      Home Living Family/patient expects to be discharged to:: Private residence Living Arrangements: Spouse/significant other Available Help at Discharge: Family;Available 24 hours/day Type of Home: House Home Access: Stairs to enter [employer] of Steps: 5-6 Entrance Stairs-Rails: Right;Left Home Layout: Two level;Bed/bath upstairs;Able to live on main level with bedroom/bathroom Alternate Level Stairs-Number of Steps: 13   Bathroom Shower/Tub: Teacher, early years/pre: Handicapped height Bathroom Accessibility: Yes How Accessible: Accessible via walker Home Equipment: None          Prior Functioning/Environment Level of Independence: Independent        Comments: Patient states community ambulation without AD, independent with ADL, retired but [place]        OT Problem List: Decreased strength;Decreased range of motion;Decreased activity tolerance;Impaired balance (sitting and/or standing);Decreased safety awareness;Decreased knowledge of use of DME or AE;Pain      OT Treatment/Interventions: Self-care/ADL training;Therapeutic exercise;Balance training;Patient/family education;Therapeutic activities;DME and/or AE instruction    OT Goals(Current goals can be found in the care plan section) Acute Rehab OT Goals Patient Stated Goal: return home OT Goal Formulation: With patient Time For Goal Achievement: 11/09/20 Potential to Achieve Goals: Fair ADL Goals Pt Will Perform Grooming: with supervision;standing Pt Will Perform  Lower Body Bathing: with set-up;sit to/from stand Pt Will Perform Lower Body Dressing: with set-up;sit to/from stand Pt Will Transfer to Toilet: with modified independence;ambulating Pt Will Perform Toileting - Clothing Manipulation and hygiene: with modified independence Additional ADL Goal #1: Pt will tolerate at least 5 minutes of OOB functional activity to prepare for safe discharge home  OT Frequency: Min 2X/week    AM-PAC OT "6 Clicks" Daily Activity     Outcome Measure Help from another person eating meals?: None Help from another person taking care of personal grooming?: A Little Help from another person toileting, which includes using toliet, bedpan, or urinal?: A Little Help from another person bathing (including washing, rinsing, drying)?: A Lot Help from another person to put on and taking off regular upper body clothing?: None Help from another person to put on and taking off regular lower body clothing?: A Lot 6 Click Score: 18   End of Session Equipment Utilized During Treatment: Rolling walker Nurse Communication: Mobility status;Precautions;Weight bearing status  Activity Tolerance: Patient tolerated treatment well Patient left: in bed;with call bell/phone within reach;with bed alarm set  OT Visit Diagnosis: Unsteadiness on feet (R26.81);Muscle weakness (generalized) (M62.81);Pain                Time: 1345-1358 OT Time Calculation (min): 13 min Charges:  OT General Charges $OT Visit: 1 Visit OT Evaluation $OT Eval Moderate Complexity: 1 Mod  Ceili Boshers A Maeola Mchaney 10/26/2020, 2:01 PM

## 2020-10-26 NOTE — Progress Notes (Signed)
PROGRESS NOTE    DAISON STEGEN  [id number] DOB: [birth year] DOA: 10/23/2020 PCP: [name], MD    Brief Narrative:  Mr. Wing was admitted to the hospital the working diagnosis of acute/ subacute acalculus cholecystitis.    75 year old male past medical history for hypertension, type 2 diabetes mellitus, dyslipidemia, invasive prostate cancer status post brachytherapy now in remission, chronic kidney disease stage III.  Recent hospitalization 8/17-8/22/2022 for sepsis due to acute acalculous cholecystitis complicated by subcapsular liver abscess and S. Gallolyticus bacteremia.  He received intravenous antibiotic with Unasyn and discharged on Augmentin to complete 9/16.  Surgery recommended delay operative management.  He was discharged to a skilled nursing facility in stable condition. On the day of his hospitalization he had a follow-up right upper quadrant ultrasound/HIDA scan which showed gangrenous cholecystitis with a large liver abscess that prompted his surgeon to refer him to the hospital for IR cholecystostomy tube and liver abscess drainage. On his initial physical examination blood pressure 143/50, heart rate 81, respiratory rate 16, temperature 98.9, oxygen saturation 95%, his lungs are clear to auscultation bilaterally, heart S1-S2, present, rhythmic, his abdomen was tender in the right upper quadrant, no rebound or guarding, no lower extremity edema.   Sodium 133, potassium 3.8, chloride 99, bicarb 27, glucose 193, BUN 31, creatinine 1.67, white count 18.0, hemoglobin 10.3, hematocrit 31.2, platelets 242. SARS COVID-19 negative.   CT of the abdomen/pelvis with decreased pericholecystic edema suggesting slightly improved cholecystitis.  Complex intraparenchymal pericholecystic liver lesion likely abscess.  Multiple capsular-based fluid collections.   Patient was placed on IV antibiotic therapy with good toleration.   08/26 percutaneous cholecystostomy tube, infrahepatic  abscess drainage placement, superior perihepatic abscess drainage catheter and intrahepatic abscess aspiration.    Clinically patient has been improving after drains placed.  Plan for outpatient follow up with Dr Constance Haw, and oral antibiotic therapy at discharge.    Assessment & Plan:   Principal Problem:   Liver abscess Active Problems:   Essential hypertension, benign   Dyslipidemia   Uncontrolled type II diabetes mellitus with nephropathy (HCC)   Obesity (BMI 30-39.9)   HTN (hypertension)   Type II diabetes mellitus with nephropathy (HCC)   CKD (chronic kidney disease) stage 3, GFR 30-59 ml/min (HCC)   Prostate cancer (HCC)   Gangrenous cholecystitis   CKD stage 3 due to type 2 diabetes mellitus (HCC)   Cholecystitis   Pressure injury of skin   Acalculous cholecystis complicated with liver abscess, complicated with severe sepsis (end-organ failure AKI), present on admission.  SP 2 hepatic drains, cholecystostomy tube and intrahepatic abscess drain with good toleration.   Patient is tolerating po well, his abdominal discomfort continue to improve but not yet back to baseline.  Wbc is down to 16,4 and  cultures continue with no growth.    Continue with antibiotic therapy with Zosyn, plan for a total of 10 days with transition to oral at the time of discharge.  Continue pain control with hydromorphone and tramadol as needed. Encourage out of bed to chair tid with meals. PT and OT evaluation.   Eventual cholecystectomy per Dr Constance Haw as outpatient.    2. AKI on CKD stage 3a/ hyponatremia  Serum cr today is 1,9 with K at 3,9 and serum bicarbonate at 23.  Patient is tolerating po well, positive edema at the lower extremities  Will hold on IV fluids for now and will follow renal function in am. Avoid hypotension and nephrotoxic medications.  Resume HCTZ  3. T2DM/ dyslipidemia  His glucose has been stable, this am fasting was 176 mg/dl. Continue with insulin sliding scale  for glucose cover and monitoring, along with basal insulin. Continue to hold linagliptin/ pioglitazone    Continue with simvastatin   4. Hx of prostate CA on remission. No urinary retention.   5. Stage 2 left buttock pressure ulcer. Present on admission. Continue with local skin care   6. HTN.  systolic blood pressure 123456 to 150 mmHg, plan to continue with amlodipine and resume HCTZ.    7. Obesity class 2. Calculated BMI is 35.25    8. Chronic multifactorial anemia.  Hgb stable at 9,5 with hct at 28,5. No current indication for prbc transfusion, no signs of bleeding or blood loss.   Patient continue to be at high risk for worsening sepsis   Status is: Inpatient  Remains inpatient appropriate because:Inpatient level of care appropriate due to severity of illness  Dispo: The patient is from: Home              Anticipated d/c is to: Home              Patient currently is not medically stable to d/c.   Difficult to place patient No   DVT prophylaxis: Enoxaparin   Code Status:   full  Family Communication:  No family at the bedside      Nutrition Status:           Skin Documentation: Pressure Injury 10/23/20 Buttocks Left Stage 2 -  Partial thickness loss of dermis presenting as a shallow open injury with a red, pink wound bed without slough. (Active)  10/23/20 1950  Location: Buttocks  Location Orientation: Left  Staging: Stage 2 -  Partial thickness loss of dermis presenting as a shallow open injury with a red, pink wound bed without slough.  Wound Description (Comments):   Present on Admission: Yes     Consultants:  Surgery  IR     Procedures:  Liver abscess drain x1, and drainage tubes x2 Cholecystostomy tube    Antimicrobials:  Zosyn    Subjective: Patient is feeling better, but not yet back to baseline, continue to have abdominal discomfort but no nausea or vomiting, no chest pain or dyspnea,   Objective: Vitals:   10/25/20 2318 10/26/20 0501  10/26/20 0724 10/26/20 1119  BP: (!) 127/54 (!) 145/61 (!) 160/56 (!) 154/48  Pulse: 72 69 73 72  Resp: '18 17 19 18  '$ Temp: 98 F (36.7 C) 98 F (36.7 C) 97.9 F (36.6 C) 98.8 F (37.1 C)  TempSrc: Oral Oral Oral Oral  SpO2: 95% 95% 95% 99%    Intake/Output Summary (Last 24 hours) at 10/26/2020 1216 Last data filed at 10/26/2020 1010 Gross per 24 hour  Intake 1390.95 ml  Output 515 ml  Net 875.95 ml   There were no vitals filed for this visit.  Examination:   General: Not in pain or dyspnea, deconditioned  Neurology: Awake and alert, non focal  E ENT: mild pallor, no icterus, oral mucosa moist Cardiovascular: No JVD. S1-S2 present, rhythmic, no gallops, rubs, or murmurs. No lower extremity edema. Pulmonary: positive breath sounds bilaterally, with no wheezing, rhonchi or rales. Gastrointestinal. Abdomen mild distended, drains in place Skin. No rashes Musculoskeletal: no joint deformities     Data Reviewed: I have personally reviewed following labs and imaging studies  CBC: Recent Labs  Lab 10/20/20 0633 10/23/20 1122 10/24/20 0131 10/25/20 0226 10/26/20 0758  WBC 17.4*  18.0* 16.5* 26.2* 16.4*  NEUTROABS 14.6* 15.5*  --   --  14.2*  HGB 10.8* 10.3* 10.0* 11.1* 9.5*  HCT 31.9* 31.2* 29.3* 34.2* 28.5*  MCV 96.7 97.8 95.1 97.7 95.6  PLT 160 242 278 331 0000000   Basic Metabolic Panel: Recent Labs  Lab 10/20/20 0633 10/23/20 1122 10/24/20 0131 10/25/20 0226 10/26/20 0758  NA 132* 133* 133* 134* 134*  K 3.7 3.8 3.5 4.0 3.9  CL 100 99 101 100 101  CO2 '25 27 24 24 23  '$ GLUCOSE 180* 193* 164* 169* 176*  BUN 45* 31* 25* 22 28*  CREATININE 1.71* 1.67* 1.46* 1.66* 1.91*  CALCIUM 7.6* 8.2* 7.9* 8.2* 8.0*   GFR: Estimated Creatinine Clearance: 48.9 mL/min (A) (by C-G formula based on SCr of 1.91 mg/dL (H)). Liver Function Tests: Recent Labs  Lab 10/23/20 1122 10/24/20 0131  AST 31 34  ALT 38 41  ALKPHOS 74 69  BILITOT 0.7 0.7  PROT 5.4* 5.0*  ALBUMIN 1.9*  1.8*   No results for input(s): LIPASE, AMYLASE in the last 168 hours. No results for input(s): AMMONIA in the last 168 hours. Coagulation Profile: No results for input(s): INR, PROTIME in the last 168 hours. Cardiac Enzymes: No results for input(s): CKTOTAL, CKMB, CKMBINDEX, TROPONINI in the last 168 hours. BNP (last 3 results) No results for input(s): PROBNP in the last 8760 hours. HbA1C: Recent Labs    10/23/20 1318  HGBA1C 7.6*   CBG: Recent Labs  Lab 10/25/20 1133 10/25/20 1606 10/25/20 2030 10/26/20 0726 10/26/20 1212  GLUCAP 153* 186* 196* 176* 203*   Lipid Profile: No results for input(s): CHOL, HDL, LDLCALC, TRIG, CHOLHDL, LDLDIRECT in the last 72 hours. Thyroid Function Tests: No results for input(s): TSH, T4TOTAL, FREET4, T3FREE, THYROIDAB in the last 72 hours. Anemia Panel: No results for input(s): VITAMINB12, FOLATE, FERRITIN, TIBC, IRON, RETICCTPCT in the last 72 hours.    Radiology Studies: I have reviewed all of the imaging during this hospital visit personally     Scheduled Meds:  amLODipine  5 mg Oral Daily   aspirin EC  81 mg Oral Daily   enoxaparin (LOVENOX) injection  40 mg Subcutaneous Q24H   famotidine  40 mg Oral QHS   insulin aspart  0-15 Units Subcutaneous TID WC   insulin glargine-yfgn  8 Units Subcutaneous QHS   oxybutynin  5 mg Oral BID   pantoprazole  40 mg Oral Daily   saccharomyces boulardii  250 mg Oral BID   simvastatin  20 mg Oral QHS   sodium chloride flush  5 mL Intracatheter Q8H   tamsulosin  0.4 mg Oral BID PC   Continuous Infusions:  piperacillin-tazobactam (ZOSYN)  IV 3.375 g (10/26/20 0500)     LOS: 3 days        Uriyah Raska Gerome Apley, MD

## 2020-10-26 NOTE — Progress Notes (Signed)
Subjective/Chief Complaint: Still sore in RUQ No BM yet WBC improving  Objective: Vital signs in last 24 hours: Temp:  [97.6 F (36.4 C)-98.6 F (37 C)] 97.9 F (36.6 C) (08/28 0724) Pulse Rate:  [69-86] 73 (08/28 0724) Resp:  [16-19] 19 (08/28 0724) BP: (117-160)/(53-61) 160/56 (08/28 0724) SpO2:  [92 %-95 %] 95 % (08/28 0724) Last BM Date: 10/24/20 (stated by patient; x3 bm)  Intake/Output from previous day: 08/27 0701 - 08/28 0700 In: [id number] [P.O.:720; I.V.:442.1; IV Piggyback:98.9] Out: 440 [Urine:350; Drains:90] Intake/Output this shift: Total I/O In: 120 [P.O.:120] Out: 200 [Urine:200]  Elderly male in NAD Abd - mild distended; RUQ tenderness Perc chole drain with bilious output Two JP drains in perihepatic fluid collections - serous drainage  Lab Results:  Recent Labs    10/25/20 0226 10/26/20 0758  WBC 26.2* 16.4*  HGB 11.1* 9.5*  HCT 34.2* 28.5*  PLT 331 333   BMET Recent Labs    10/25/20 0226 10/26/20 0758  NA 134* 134*  K 4.0 3.9  CL 100 101  CO2 24 23  GLUCOSE 169* 176*  BUN 22 28*  CREATININE 1.66* 1.91*  CALCIUM 8.2* 8.0*   PT/INR No results for input(s): LABPROT, INR in the last 72 hours. ABG No results for input(s): PHART, HCO3 in the last 72 hours.  Invalid input(s): PCO2, PO2  Studies/Results: IR Guided Drain W Catheter Placement  Result Date: 10/24/2020 INDICATION: Acute cholecystitis, worsening perihepatic fluid collections and intrahepatic fluid collections near the gallbladder fossa. EXAM: 1. PERCUTANEOUS CHOLECYSTOSTOMY TUBE PLACEMENT WITH ULTRASOUND AND FLUOROSCOPIC GUIDANCE 2. PERCUTANEOUS CATHETER DRAINAGE OF INFRAHEPATIC ABSCESS WITH ULTRASOUND AND FLUOROSCOPIC GUIDANCE 3. PERCUTANEOUS CATHETER DRAINAGE SUPERIOR PERIHEPATIC ABSCESS WITH ULTRASOUND AND FLUOROSCOPIC GUIDANCE 4. PERCUTANEOUS NEEDLE ASPIRATION OF HEPATIC ABSCESSES WITH ULTRASOUND GUIDANCE MEDICATIONS: None ANESTHESIA/SEDATION: Moderate (conscious) sedation  was employed during this procedure. A total of Versed 3.0 mg and Fentanyl 150 mcg was administered intravenously. Moderate Sedation Time: 47 minutes. The patient's level of consciousness and vital signs were monitored continuously by radiology nursing throughout the procedure under my direct supervision. FLUOROSCOPY TIME:  Fluoroscopy Time: 1.0 minutes.  74 mGy. CONTRAST:  10 mL Omnipaque [id number] COMPLICATIONS: None immediate. PROCEDURE: Informed written consent was obtained from the patient after a thorough discussion of the procedural risks, benefits and alternatives. All questions were addressed. Maximal Sterile Barrier Technique was utilized including caps, mask, sterile gowns, sterile gloves, sterile drape, hand hygiene and skin antiseptic. A timeout was performed prior to the initiation of the procedure. Initial ultrasound was performed to localize the gallbladder, perihepatic fluid collections and intrahepatic fluid collections. Under ultrasound guidance, a 21 gauge needle was advanced into the gallbladder lumen via a transhepatic approach. After confirming needle tip position, a guidewire was advanced into the gallbladder lumen and a transitional dilator advanced over the wire. The percutaneous tract was dilated over a guidewire and a 10 [place] multipurpose drainage catheter advanced into the gallbladder lumen. Contrast was injected via the drain to confirm position within the gallbladder lumen and the cholecystostomy tube was attached to a gravity drainage bag. Fluid collection just inferior to the tip of the right lobe of the liver was then addressed with advancement of an 18 gauge trocar needle under ultrasound guidance. Aspiration of fluid was performed followed by guidewire advancement, percutaneous tract dilatation and placement of a 10 French drainage catheter. Catheter position was confirmed by fluoroscopy. A fluid sample was withdrawn and sent for culture analysis. The drainage catheter was attached to  suction bulb  drainage. A separate superior perihepatic fluid collection was then addressed with advancement of an 18 gauge trocar needle under ultrasound guidance. Aspiration of fluid was performed followed by guidewire advancement, percutaneous tract dilatation and placement of a 10 French drainage catheter. Catheter position was confirmed by fluoroscopy. A fluid sample was withdrawn and sent for culture analysis. The drainage catheter was attached to suction bulb drainage. An 18 gauge trocar needle was advanced under ultrasound guidance into the right lobe of the liver adjacent to the gallbladder fossa. Aspiration of several small focal adjacent fluid collections were performed under ultrasound guidance. A fluid sample was sent for culture analysis. The percutaneous cholecystostomy tube and additional 2 separate perihepatic drainage catheters were then secured at the skin with Prolene retention sutures and StatLock devices. FINDINGS: After placement of the cholecystostomy tube, there is return bile. Fluid aspiration of the inferior perihepatic fluid collection yielded fairly clear appearing bilious fluid. A drainage catheter was placed into this collection given size of the collection as well as somewhat limited initial fluid return from the 18 gauge needle. Fluid aspiration of the superior perihepatic fluid collection yielded turbid appearing bilious fluid. A drainage catheter was placed given turbid nature of fluid. Aspiration at the level of small fluid collections adjacent to the gallbladder within the liver yielded a small amount of bloody and bilious fluid. A total of approximately 2-3 mL of fluid was able to be aspirated from these small collections and sent for culture analysis. No dominant abscess in the liver is identified to warrant additional drain placement within the liver. IMPRESSION: 1. Placement of 10 French percutaneous cholecystostomy tube to treat acute cholecystitis. The cholecystostomy tube  was attached to gravity bag drainage. 2. Percutaneous catheter drainage infra hepatic fluid collection yielding bilious fluid. A 10 French drain was placed and attached to suction bulb drainage. A sample of bilious fluid was sent for culture analysis. 3. Percutaneous catheter drainage of superior perihepatic fluid collection yielding turbid bilious fluid. A 10 French drain was placed and attached to suction bulb drainage. A sample of bilious fluid was sent for culture analysis. 4. Ultrasound-guided aspiration of small intrahepatic fluid collections adjacent to the gallbladder fossa. This yielded bloody and bilious fluid with a 2-3 mL sample sent for culture analysis. Electronically Signed   By: Aletta Edouard M.D.   On: 10/24/2020 17:49   IR Guided Drain W Catheter Placement  Result Date: 10/24/2020 INDICATION: Acute cholecystitis, worsening perihepatic fluid collections and intrahepatic fluid collections near the gallbladder fossa. EXAM: 1. PERCUTANEOUS CHOLECYSTOSTOMY TUBE PLACEMENT WITH ULTRASOUND AND FLUOROSCOPIC GUIDANCE 2. PERCUTANEOUS CATHETER DRAINAGE OF INFRAHEPATIC ABSCESS WITH ULTRASOUND AND FLUOROSCOPIC GUIDANCE 3. PERCUTANEOUS CATHETER DRAINAGE SUPERIOR PERIHEPATIC ABSCESS WITH ULTRASOUND AND FLUOROSCOPIC GUIDANCE 4. PERCUTANEOUS NEEDLE ASPIRATION OF HEPATIC ABSCESSES WITH ULTRASOUND GUIDANCE MEDICATIONS: None ANESTHESIA/SEDATION: Moderate (conscious) sedation was employed during this procedure. A total of Versed 3.0 mg and Fentanyl 150 mcg was administered intravenously. Moderate Sedation Time: 47 minutes. The patient's level of consciousness and vital signs were monitored continuously by radiology nursing throughout the procedure under my direct supervision. FLUOROSCOPY TIME:  Fluoroscopy Time: 1.0 minutes.  74 mGy. CONTRAST:  10 mL Omnipaque [id number] COMPLICATIONS: None immediate. PROCEDURE: Informed written consent was obtained from the patient after a thorough discussion of the procedural risks,  benefits and alternatives. All questions were addressed. Maximal Sterile Barrier Technique was utilized including caps, mask, sterile gowns, sterile gloves, sterile drape, hand hygiene and skin antiseptic. A timeout was performed prior to the initiation of the procedure. Initial  ultrasound was performed to localize the gallbladder, perihepatic fluid collections and intrahepatic fluid collections. Under ultrasound guidance, a 21 gauge needle was advanced into the gallbladder lumen via a transhepatic approach. After confirming needle tip position, a guidewire was advanced into the gallbladder lumen and a transitional dilator advanced over the wire. The percutaneous tract was dilated over a guidewire and a 10 [place] multipurpose drainage catheter advanced into the gallbladder lumen. Contrast was injected via the drain to confirm position within the gallbladder lumen and the cholecystostomy tube was attached to a gravity drainage bag. Fluid collection just inferior to the tip of the right lobe of the liver was then addressed with advancement of an 18 gauge trocar needle under ultrasound guidance. Aspiration of fluid was performed followed by guidewire advancement, percutaneous tract dilatation and placement of a 10 French drainage catheter. Catheter position was confirmed by fluoroscopy. A fluid sample was withdrawn and sent for culture analysis. The drainage catheter was attached to suction bulb drainage. A separate superior perihepatic fluid collection was then addressed with advancement of an 18 gauge trocar needle under ultrasound guidance. Aspiration of fluid was performed followed by guidewire advancement, percutaneous tract dilatation and placement of a 10 French drainage catheter. Catheter position was confirmed by fluoroscopy. A fluid sample was withdrawn and sent for culture analysis. The drainage catheter was attached to suction bulb drainage. An 18 gauge trocar needle was advanced under ultrasound guidance  into the right lobe of the liver adjacent to the gallbladder fossa. Aspiration of several small focal adjacent fluid collections were performed under ultrasound guidance. A fluid sample was sent for culture analysis. The percutaneous cholecystostomy tube and additional 2 separate perihepatic drainage catheters were then secured at the skin with Prolene retention sutures and StatLock devices. FINDINGS: After placement of the cholecystostomy tube, there is return bile. Fluid aspiration of the inferior perihepatic fluid collection yielded fairly clear appearing bilious fluid. A drainage catheter was placed into this collection given size of the collection as well as somewhat limited initial fluid return from the 18 gauge needle. Fluid aspiration of the superior perihepatic fluid collection yielded turbid appearing bilious fluid. A drainage catheter was placed given turbid nature of fluid. Aspiration at the level of small fluid collections adjacent to the gallbladder within the liver yielded a small amount of bloody and bilious fluid. A total of approximately 2-3 mL of fluid was able to be aspirated from these small collections and sent for culture analysis. No dominant abscess in the liver is identified to warrant additional drain placement within the liver. IMPRESSION: 1. Placement of 10 French percutaneous cholecystostomy tube to treat acute cholecystitis. The cholecystostomy tube was attached to gravity bag drainage. 2. Percutaneous catheter drainage infra hepatic fluid collection yielding bilious fluid. A 10 French drain was placed and attached to suction bulb drainage. A sample of bilious fluid was sent for culture analysis. 3. Percutaneous catheter drainage of superior perihepatic fluid collection yielding turbid bilious fluid. A 10 French drain was placed and attached to suction bulb drainage. A sample of bilious fluid was sent for culture analysis. 4. Ultrasound-guided aspiration of small intrahepatic fluid  collections adjacent to the gallbladder fossa. This yielded bloody and bilious fluid with a 2-3 mL sample sent for culture analysis. Electronically Signed   By: Aletta Edouard M.D.   On: 10/24/2020 17:49   IR Perc Cholecystostomy  Result Date: 10/24/2020 INDICATION: Acute cholecystitis, worsening perihepatic fluid collections and intrahepatic fluid collections near the gallbladder fossa. EXAM: 1. PERCUTANEOUS CHOLECYSTOSTOMY TUBE  PLACEMENT WITH ULTRASOUND AND FLUOROSCOPIC GUIDANCE 2. PERCUTANEOUS CATHETER DRAINAGE OF INFRAHEPATIC ABSCESS WITH ULTRASOUND AND FLUOROSCOPIC GUIDANCE 3. PERCUTANEOUS CATHETER DRAINAGE SUPERIOR PERIHEPATIC ABSCESS WITH ULTRASOUND AND FLUOROSCOPIC GUIDANCE 4. PERCUTANEOUS NEEDLE ASPIRATION OF HEPATIC ABSCESSES WITH ULTRASOUND GUIDANCE MEDICATIONS: None ANESTHESIA/SEDATION: Moderate (conscious) sedation was employed during this procedure. A total of Versed 3.0 mg and Fentanyl 150 mcg was administered intravenously. Moderate Sedation Time: 47 minutes. The patient's level of consciousness and vital signs were monitored continuously by radiology nursing throughout the procedure under my direct supervision. FLUOROSCOPY TIME:  Fluoroscopy Time: 1.0 minutes.  74 mGy. CONTRAST:  10 mL Omnipaque [id number] COMPLICATIONS: None immediate. PROCEDURE: Informed written consent was obtained from the patient after a thorough discussion of the procedural risks, benefits and alternatives. All questions were addressed. Maximal Sterile Barrier Technique was utilized including caps, mask, sterile gowns, sterile gloves, sterile drape, hand hygiene and skin antiseptic. A timeout was performed prior to the initiation of the procedure. Initial ultrasound was performed to localize the gallbladder, perihepatic fluid collections and intrahepatic fluid collections. Under ultrasound guidance, a 21 gauge needle was advanced into the gallbladder lumen via a transhepatic approach. After confirming needle tip position, a  guidewire was advanced into the gallbladder lumen and a transitional dilator advanced over the wire. The percutaneous tract was dilated over a guidewire and a 10 [place] multipurpose drainage catheter advanced into the gallbladder lumen. Contrast was injected via the drain to confirm position within the gallbladder lumen and the cholecystostomy tube was attached to a gravity drainage bag. Fluid collection just inferior to the tip of the right lobe of the liver was then addressed with advancement of an 18 gauge trocar needle under ultrasound guidance. Aspiration of fluid was performed followed by guidewire advancement, percutaneous tract dilatation and placement of a 10 French drainage catheter. Catheter position was confirmed by fluoroscopy. A fluid sample was withdrawn and sent for culture analysis. The drainage catheter was attached to suction bulb drainage. A separate superior perihepatic fluid collection was then addressed with advancement of an 18 gauge trocar needle under ultrasound guidance. Aspiration of fluid was performed followed by guidewire advancement, percutaneous tract dilatation and placement of a 10 French drainage catheter. Catheter position was confirmed by fluoroscopy. A fluid sample was withdrawn and sent for culture analysis. The drainage catheter was attached to suction bulb drainage. An 18 gauge trocar needle was advanced under ultrasound guidance into the right lobe of the liver adjacent to the gallbladder fossa. Aspiration of several small focal adjacent fluid collections were performed under ultrasound guidance. A fluid sample was sent for culture analysis. The percutaneous cholecystostomy tube and additional 2 separate perihepatic drainage catheters were then secured at the skin with Prolene retention sutures and StatLock devices. FINDINGS: After placement of the cholecystostomy tube, there is return bile. Fluid aspiration of the inferior perihepatic fluid collection yielded fairly clear  appearing bilious fluid. A drainage catheter was placed into this collection given size of the collection as well as somewhat limited initial fluid return from the 18 gauge needle. Fluid aspiration of the superior perihepatic fluid collection yielded turbid appearing bilious fluid. A drainage catheter was placed given turbid nature of fluid. Aspiration at the level of small fluid collections adjacent to the gallbladder within the liver yielded a small amount of bloody and bilious fluid. A total of approximately 2-3 mL of fluid was able to be aspirated from these small collections and sent for culture analysis. No dominant abscess in the liver is identified to warrant additional drain  placement within the liver. IMPRESSION: 1. Placement of 10 French percutaneous cholecystostomy tube to treat acute cholecystitis. The cholecystostomy tube was attached to gravity bag drainage. 2. Percutaneous catheter drainage infra hepatic fluid collection yielding bilious fluid. A 10 French drain was placed and attached to suction bulb drainage. A sample of bilious fluid was sent for culture analysis. 3. Percutaneous catheter drainage of superior perihepatic fluid collection yielding turbid bilious fluid. A 10 French drain was placed and attached to suction bulb drainage. A sample of bilious fluid was sent for culture analysis. 4. Ultrasound-guided aspiration of small intrahepatic fluid collections adjacent to the gallbladder fossa. This yielded bloody and bilious fluid with a 2-3 mL sample sent for culture analysis. Electronically Signed   By: Aletta Edouard M.D.   On: 10/24/2020 17:49   IR US Guide Bx Asp/Drain  Result Date: 10/24/2020 INDICATION: Acute cholecystitis, worsening perihepatic fluid collections and intrahepatic fluid collections near the gallbladder fossa. EXAM: 1. PERCUTANEOUS CHOLECYSTOSTOMY TUBE PLACEMENT WITH ULTRASOUND AND FLUOROSCOPIC GUIDANCE 2. PERCUTANEOUS CATHETER DRAINAGE OF INFRAHEPATIC ABSCESS WITH  ULTRASOUND AND FLUOROSCOPIC GUIDANCE 3. PERCUTANEOUS CATHETER DRAINAGE SUPERIOR PERIHEPATIC ABSCESS WITH ULTRASOUND AND FLUOROSCOPIC GUIDANCE 4. PERCUTANEOUS NEEDLE ASPIRATION OF HEPATIC ABSCESSES WITH ULTRASOUND GUIDANCE MEDICATIONS: None ANESTHESIA/SEDATION: Moderate (conscious) sedation was employed during this procedure. A total of Versed 3.0 mg and Fentanyl 150 mcg was administered intravenously. Moderate Sedation Time: 47 minutes. The patient's level of consciousness and vital signs were monitored continuously by radiology nursing throughout the procedure under my direct supervision. FLUOROSCOPY TIME:  Fluoroscopy Time: 1.0 minutes.  74 mGy. CONTRAST:  10 mL Omnipaque [id number] COMPLICATIONS: None immediate. PROCEDURE: Informed written consent was obtained from the patient after a thorough discussion of the procedural risks, benefits and alternatives. All questions were addressed. Maximal Sterile Barrier Technique was utilized including caps, mask, sterile gowns, sterile gloves, sterile drape, hand hygiene and skin antiseptic. A timeout was performed prior to the initiation of the procedure. Initial ultrasound was performed to localize the gallbladder, perihepatic fluid collections and intrahepatic fluid collections. Under ultrasound guidance, a 21 gauge needle was advanced into the gallbladder lumen via a transhepatic approach. After confirming needle tip position, a guidewire was advanced into the gallbladder lumen and a transitional dilator advanced over the wire. The percutaneous tract was dilated over a guidewire and a 10 [place] multipurpose drainage catheter advanced into the gallbladder lumen. Contrast was injected via the drain to confirm position within the gallbladder lumen and the cholecystostomy tube was attached to a gravity drainage bag. Fluid collection just inferior to the tip of the right lobe of the liver was then addressed with advancement of an 18 gauge trocar needle under ultrasound guidance.  Aspiration of fluid was performed followed by guidewire advancement, percutaneous tract dilatation and placement of a 10 French drainage catheter. Catheter position was confirmed by fluoroscopy. A fluid sample was withdrawn and sent for culture analysis. The drainage catheter was attached to suction bulb drainage. A separate superior perihepatic fluid collection was then addressed with advancement of an 18 gauge trocar needle under ultrasound guidance. Aspiration of fluid was performed followed by guidewire advancement, percutaneous tract dilatation and placement of a 10 French drainage catheter. Catheter position was confirmed by fluoroscopy. A fluid sample was withdrawn and sent for culture analysis. The drainage catheter was attached to suction bulb drainage. An 18 gauge trocar needle was advanced under ultrasound guidance into the right lobe of the liver adjacent to the gallbladder fossa. Aspiration of several small focal adjacent fluid collections were performed  under ultrasound guidance. A fluid sample was sent for culture analysis. The percutaneous cholecystostomy tube and additional 2 separate perihepatic drainage catheters were then secured at the skin with Prolene retention sutures and StatLock devices. FINDINGS: After placement of the cholecystostomy tube, there is return bile. Fluid aspiration of the inferior perihepatic fluid collection yielded fairly clear appearing bilious fluid. A drainage catheter was placed into this collection given size of the collection as well as somewhat limited initial fluid return from the 18 gauge needle. Fluid aspiration of the superior perihepatic fluid collection yielded turbid appearing bilious fluid. A drainage catheter was placed given turbid nature of fluid. Aspiration at the level of small fluid collections adjacent to the gallbladder within the liver yielded a small amount of bloody and bilious fluid. A total of approximately 2-3 mL of fluid was able to be  aspirated from these small collections and sent for culture analysis. No dominant abscess in the liver is identified to warrant additional drain placement within the liver. IMPRESSION: 1. Placement of 10 French percutaneous cholecystostomy tube to treat acute cholecystitis. The cholecystostomy tube was attached to gravity bag drainage. 2. Percutaneous catheter drainage infra hepatic fluid collection yielding bilious fluid. A 10 French drain was placed and attached to suction bulb drainage. A sample of bilious fluid was sent for culture analysis. 3. Percutaneous catheter drainage of superior perihepatic fluid collection yielding turbid bilious fluid. A 10 French drain was placed and attached to suction bulb drainage. A sample of bilious fluid was sent for culture analysis. 4. Ultrasound-guided aspiration of small intrahepatic fluid collections adjacent to the gallbladder fossa. This yielded bloody and bilious fluid with a 2-3 mL sample sent for culture analysis. Electronically Signed   By: Aletta Edouard M.D.   On: 10/24/2020 17:49   IR US Guide Bx Asp/Drain  Result Date: 10/24/2020 INDICATION: Acute cholecystitis, worsening perihepatic fluid collections and intrahepatic fluid collections near the gallbladder fossa. EXAM: 1. PERCUTANEOUS CHOLECYSTOSTOMY TUBE PLACEMENT WITH ULTRASOUND AND FLUOROSCOPIC GUIDANCE 2. PERCUTANEOUS CATHETER DRAINAGE OF INFRAHEPATIC ABSCESS WITH ULTRASOUND AND FLUOROSCOPIC GUIDANCE 3. PERCUTANEOUS CATHETER DRAINAGE SUPERIOR PERIHEPATIC ABSCESS WITH ULTRASOUND AND FLUOROSCOPIC GUIDANCE 4. PERCUTANEOUS NEEDLE ASPIRATION OF HEPATIC ABSCESSES WITH ULTRASOUND GUIDANCE MEDICATIONS: None ANESTHESIA/SEDATION: Moderate (conscious) sedation was employed during this procedure. A total of Versed 3.0 mg and Fentanyl 150 mcg was administered intravenously. Moderate Sedation Time: 47 minutes. The patient's level of consciousness and vital signs were monitored continuously by radiology nursing  throughout the procedure under my direct supervision. FLUOROSCOPY TIME:  Fluoroscopy Time: 1.0 minutes.  74 mGy. CONTRAST:  10 mL Omnipaque [id number] COMPLICATIONS: None immediate. PROCEDURE: Informed written consent was obtained from the patient after a thorough discussion of the procedural risks, benefits and alternatives. All questions were addressed. Maximal Sterile Barrier Technique was utilized including caps, mask, sterile gowns, sterile gloves, sterile drape, hand hygiene and skin antiseptic. A timeout was performed prior to the initiation of the procedure. Initial ultrasound was performed to localize the gallbladder, perihepatic fluid collections and intrahepatic fluid collections. Under ultrasound guidance, a 21 gauge needle was advanced into the gallbladder lumen via a transhepatic approach. After confirming needle tip position, a guidewire was advanced into the gallbladder lumen and a transitional dilator advanced over the wire. The percutaneous tract was dilated over a guidewire and a 10 [place] multipurpose drainage catheter advanced into the gallbladder lumen. Contrast was injected via the drain to confirm position within the gallbladder lumen and the cholecystostomy tube was attached to a gravity drainage bag. Fluid collection just  inferior to the tip of the right lobe of the liver was then addressed with advancement of an 18 gauge trocar needle under ultrasound guidance. Aspiration of fluid was performed followed by guidewire advancement, percutaneous tract dilatation and placement of a 10 French drainage catheter. Catheter position was confirmed by fluoroscopy. A fluid sample was withdrawn and sent for culture analysis. The drainage catheter was attached to suction bulb drainage. A separate superior perihepatic fluid collection was then addressed with advancement of an 18 gauge trocar needle under ultrasound guidance. Aspiration of fluid was performed followed by guidewire advancement, percutaneous tract  dilatation and placement of a 10 French drainage catheter. Catheter position was confirmed by fluoroscopy. A fluid sample was withdrawn and sent for culture analysis. The drainage catheter was attached to suction bulb drainage. An 18 gauge trocar needle was advanced under ultrasound guidance into the right lobe of the liver adjacent to the gallbladder fossa. Aspiration of several small focal adjacent fluid collections were performed under ultrasound guidance. A fluid sample was sent for culture analysis. The percutaneous cholecystostomy tube and additional 2 separate perihepatic drainage catheters were then secured at the skin with Prolene retention sutures and StatLock devices. FINDINGS: After placement of the cholecystostomy tube, there is return bile. Fluid aspiration of the inferior perihepatic fluid collection yielded fairly clear appearing bilious fluid. A drainage catheter was placed into this collection given size of the collection as well as somewhat limited initial fluid return from the 18 gauge needle. Fluid aspiration of the superior perihepatic fluid collection yielded turbid appearing bilious fluid. A drainage catheter was placed given turbid nature of fluid. Aspiration at the level of small fluid collections adjacent to the gallbladder within the liver yielded a small amount of bloody and bilious fluid. A total of approximately 2-3 mL of fluid was able to be aspirated from these small collections and sent for culture analysis. No dominant abscess in the liver is identified to warrant additional drain placement within the liver. IMPRESSION: 1. Placement of 10 French percutaneous cholecystostomy tube to treat acute cholecystitis. The cholecystostomy tube was attached to gravity bag drainage. 2. Percutaneous catheter drainage infra hepatic fluid collection yielding bilious fluid. A 10 French drain was placed and attached to suction bulb drainage. A sample of bilious fluid was sent for culture analysis.  3. Percutaneous catheter drainage of superior perihepatic fluid collection yielding turbid bilious fluid. A 10 French drain was placed and attached to suction bulb drainage. A sample of bilious fluid was sent for culture analysis. 4. Ultrasound-guided aspiration of small intrahepatic fluid collections adjacent to the gallbladder fossa. This yielded bloody and bilious fluid with a 2-3 mL sample sent for culture analysis. Electronically Signed   By: Aletta Edouard M.D.   On: 10/24/2020 17:49   IR US Guide Bx Asp/Drain  Result Date: 10/24/2020 INDICATION: Acute cholecystitis, worsening perihepatic fluid collections and intrahepatic fluid collections near the gallbladder fossa. EXAM: 1. PERCUTANEOUS CHOLECYSTOSTOMY TUBE PLACEMENT WITH ULTRASOUND AND FLUOROSCOPIC GUIDANCE 2. PERCUTANEOUS CATHETER DRAINAGE OF INFRAHEPATIC ABSCESS WITH ULTRASOUND AND FLUOROSCOPIC GUIDANCE 3. PERCUTANEOUS CATHETER DRAINAGE SUPERIOR PERIHEPATIC ABSCESS WITH ULTRASOUND AND FLUOROSCOPIC GUIDANCE 4. PERCUTANEOUS NEEDLE ASPIRATION OF HEPATIC ABSCESSES WITH ULTRASOUND GUIDANCE MEDICATIONS: None ANESTHESIA/SEDATION: Moderate (conscious) sedation was employed during this procedure. A total of Versed 3.0 mg and Fentanyl 150 mcg was administered intravenously. Moderate Sedation Time: 47 minutes. The patient's level of consciousness and vital signs were monitored continuously by radiology nursing throughout the procedure under my direct supervision. FLUOROSCOPY TIME:  Fluoroscopy Time: 1.0 minutes.  74 mGy. CONTRAST:  10 mL Omnipaque [id number] COMPLICATIONS: None immediate. PROCEDURE: Informed written consent was obtained from the patient after a thorough discussion of the procedural risks, benefits and alternatives. All questions were addressed. Maximal Sterile Barrier Technique was utilized including caps, mask, sterile gowns, sterile gloves, sterile drape, hand hygiene and skin antiseptic. A timeout was performed prior to the initiation of the  procedure. Initial ultrasound was performed to localize the gallbladder, perihepatic fluid collections and intrahepatic fluid collections. Under ultrasound guidance, a 21 gauge needle was advanced into the gallbladder lumen via a transhepatic approach. After confirming needle tip position, a guidewire was advanced into the gallbladder lumen and a transitional dilator advanced over the wire. The percutaneous tract was dilated over a guidewire and a 10 [place] multipurpose drainage catheter advanced into the gallbladder lumen. Contrast was injected via the drain to confirm position within the gallbladder lumen and the cholecystostomy tube was attached to a gravity drainage bag. Fluid collection just inferior to the tip of the right lobe of the liver was then addressed with advancement of an 18 gauge trocar needle under ultrasound guidance. Aspiration of fluid was performed followed by guidewire advancement, percutaneous tract dilatation and placement of a 10 French drainage catheter. Catheter position was confirmed by fluoroscopy. A fluid sample was withdrawn and sent for culture analysis. The drainage catheter was attached to suction bulb drainage. A separate superior perihepatic fluid collection was then addressed with advancement of an 18 gauge trocar needle under ultrasound guidance. Aspiration of fluid was performed followed by guidewire advancement, percutaneous tract dilatation and placement of a 10 French drainage catheter. Catheter position was confirmed by fluoroscopy. A fluid sample was withdrawn and sent for culture analysis. The drainage catheter was attached to suction bulb drainage. An 18 gauge trocar needle was advanced under ultrasound guidance into the right lobe of the liver adjacent to the gallbladder fossa. Aspiration of several small focal adjacent fluid collections were performed under ultrasound guidance. A fluid sample was sent for culture analysis. The percutaneous cholecystostomy tube and  additional 2 separate perihepatic drainage catheters were then secured at the skin with Prolene retention sutures and StatLock devices. FINDINGS: After placement of the cholecystostomy tube, there is return bile. Fluid aspiration of the inferior perihepatic fluid collection yielded fairly clear appearing bilious fluid. A drainage catheter was placed into this collection given size of the collection as well as somewhat limited initial fluid return from the 18 gauge needle. Fluid aspiration of the superior perihepatic fluid collection yielded turbid appearing bilious fluid. A drainage catheter was placed given turbid nature of fluid. Aspiration at the level of small fluid collections adjacent to the gallbladder within the liver yielded a small amount of bloody and bilious fluid. A total of approximately 2-3 mL of fluid was able to be aspirated from these small collections and sent for culture analysis. No dominant abscess in the liver is identified to warrant additional drain placement within the liver. IMPRESSION: 1. Placement of 10 French percutaneous cholecystostomy tube to treat acute cholecystitis. The cholecystostomy tube was attached to gravity bag drainage. 2. Percutaneous catheter drainage infra hepatic fluid collection yielding bilious fluid. A 10 French drain was placed and attached to suction bulb drainage. A sample of bilious fluid was sent for culture analysis. 3. Percutaneous catheter drainage of superior perihepatic fluid collection yielding turbid bilious fluid. A 10 French drain was placed and attached to suction bulb drainage. A sample of bilious fluid was sent for culture analysis. 4. Ultrasound-guided aspiration of  small intrahepatic fluid collections adjacent to the gallbladder fossa. This yielded bloody and bilious fluid with a 2-3 mL sample sent for culture analysis. Electronically Signed   By: Aletta Edouard M.D.   On: 10/24/2020 17:49   IR Fluoro Guide Ndl Plmt / BX  Result Date:  10/24/2020 INDICATION: Acute cholecystitis, worsening perihepatic fluid collections and intrahepatic fluid collections near the gallbladder fossa. EXAM: 1. PERCUTANEOUS CHOLECYSTOSTOMY TUBE PLACEMENT WITH ULTRASOUND AND FLUOROSCOPIC GUIDANCE 2. PERCUTANEOUS CATHETER DRAINAGE OF INFRAHEPATIC ABSCESS WITH ULTRASOUND AND FLUOROSCOPIC GUIDANCE 3. PERCUTANEOUS CATHETER DRAINAGE SUPERIOR PERIHEPATIC ABSCESS WITH ULTRASOUND AND FLUOROSCOPIC GUIDANCE 4. PERCUTANEOUS NEEDLE ASPIRATION OF HEPATIC ABSCESSES WITH ULTRASOUND GUIDANCE MEDICATIONS: None ANESTHESIA/SEDATION: Moderate (conscious) sedation was employed during this procedure. A total of Versed 3.0 mg and Fentanyl 150 mcg was administered intravenously. Moderate Sedation Time: 47 minutes. The patient's level of consciousness and vital signs were monitored continuously by radiology nursing throughout the procedure under my direct supervision. FLUOROSCOPY TIME:  Fluoroscopy Time: 1.0 minutes.  74 mGy. CONTRAST:  10 mL Omnipaque [id number] COMPLICATIONS: None immediate. PROCEDURE: Informed written consent was obtained from the patient after a thorough discussion of the procedural risks, benefits and alternatives. All questions were addressed. Maximal Sterile Barrier Technique was utilized including caps, mask, sterile gowns, sterile gloves, sterile drape, hand hygiene and skin antiseptic. A timeout was performed prior to the initiation of the procedure. Initial ultrasound was performed to localize the gallbladder, perihepatic fluid collections and intrahepatic fluid collections. Under ultrasound guidance, a 21 gauge needle was advanced into the gallbladder lumen via a transhepatic approach. After confirming needle tip position, a guidewire was advanced into the gallbladder lumen and a transitional dilator advanced over the wire. The percutaneous tract was dilated over a guidewire and a 10 [place] multipurpose drainage catheter advanced into the gallbladder lumen. Contrast was  injected via the drain to confirm position within the gallbladder lumen and the cholecystostomy tube was attached to a gravity drainage bag. Fluid collection just inferior to the tip of the right lobe of the liver was then addressed with advancement of an 18 gauge trocar needle under ultrasound guidance. Aspiration of fluid was performed followed by guidewire advancement, percutaneous tract dilatation and placement of a 10 French drainage catheter. Catheter position was confirmed by fluoroscopy. A fluid sample was withdrawn and sent for culture analysis. The drainage catheter was attached to suction bulb drainage. A separate superior perihepatic fluid collection was then addressed with advancement of an 18 gauge trocar needle under ultrasound guidance. Aspiration of fluid was performed followed by guidewire advancement, percutaneous tract dilatation and placement of a 10 French drainage catheter. Catheter position was confirmed by fluoroscopy. A fluid sample was withdrawn and sent for culture analysis. The drainage catheter was attached to suction bulb drainage. An 18 gauge trocar needle was advanced under ultrasound guidance into the right lobe of the liver adjacent to the gallbladder fossa. Aspiration of several small focal adjacent fluid collections were performed under ultrasound guidance. A fluid sample was sent for culture analysis. The percutaneous cholecystostomy tube and additional 2 separate perihepatic drainage catheters were then secured at the skin with Prolene retention sutures and StatLock devices. FINDINGS: After placement of the cholecystostomy tube, there is return bile. Fluid aspiration of the inferior perihepatic fluid collection yielded fairly clear appearing bilious fluid. A drainage catheter was placed into this collection given size of the collection as well as somewhat limited initial fluid return from the 18 gauge needle. Fluid aspiration of the superior perihepatic fluid collection yielded  turbid appearing bilious fluid. A drainage catheter was placed given turbid nature of fluid. Aspiration at the level of small fluid collections adjacent to the gallbladder within the liver yielded a small amount of bloody and bilious fluid. A total of approximately 2-3 mL of fluid was able to be aspirated from these small collections and sent for culture analysis. No dominant abscess in the liver is identified to warrant additional drain placement within the liver. IMPRESSION: 1. Placement of 10 French percutaneous cholecystostomy tube to treat acute cholecystitis. The cholecystostomy tube was attached to gravity bag drainage. 2. Percutaneous catheter drainage infra hepatic fluid collection yielding bilious fluid. A 10 French drain was placed and attached to suction bulb drainage. A sample of bilious fluid was sent for culture analysis. 3. Percutaneous catheter drainage of superior perihepatic fluid collection yielding turbid bilious fluid. A 10 French drain was placed and attached to suction bulb drainage. A sample of bilious fluid was sent for culture analysis. 4. Ultrasound-guided aspiration of small intrahepatic fluid collections adjacent to the gallbladder fossa. This yielded bloody and bilious fluid with a 2-3 mL sample sent for culture analysis. Electronically Signed   By: Aletta Edouard M.D.   On: 10/24/2020 17:49    Anti-infectives: Anti-infectives (From admission, onward)    Start     Dose/Rate Route Frequency Ordered Stop   10/23/20 2200  piperacillin-tazobactam (ZOSYN) IVPB 3.375 g        3.375 g 12.5 mL/hr over 240 Minutes Intravenous Every 8 hours 10/23/20 1324     10/23/20 1245  piperacillin-tazobactam (ZOSYN) IVPB 3.375 g        3.375 g 100 mL/hr over 30 Minutes Intravenous  Once 10/23/20 1234 10/23/20 1523       Assessment/Plan: Acute cholecystitis - gangrenous Pericholecystic/ perihepatic fluid collections   S/p percutaneous cholecystostomy S/p percutaneous drainage of two  perihepatic fluid collections with aspiration of a possible hepatic abscess   Continue antibiotics Will follow along peripherally, in case patient's status worsens and he needs urgent surgery.  For now, he seems to be improved with drains in place.  Would eventually transition to PO abx, then work towards discharge on abx with drains in place.  Follow-up with Dr. Constance Haw for eventual interval cholecystectomy.  LOS: 3 days    [name] 10/26/2020

## 2020-10-26 NOTE — Progress Notes (Signed)
Physical Therapy Treatment Patient Details Name: James Glover MRN: [id number] DOB: [birth year] Today's Date: 10/26/2020    History of Present Illness The pt is a 75 yo male presenting 8/25 from SNF due to ultrasound and HIDA scan concerning for gangrenous cholecystitis and larger liver abscess. Pt reccently hospitalized 8/17-8/22 due to acute cholecystitis complicated by subcapsular live abscess and infection and d/c to SNF for short term rehab. PMH includes: aortic atherosclerosis, CKD III, HTN, DM II, and prostate cancer.    PT Comments    Pt progressing towards his physical therapy goals; able to perform a standing transfer from recliner with min assist and toilet transfer with no physical assist. Ambulating distance to and from bathroom with a walker at a min guard assist level. Desaturation to 86% on RA; placed back on 2L O2 where he rebounded to 92%. Noted skin tear on left buttock; notified RN and applied small foam dressing. Educated about repositioning and pressure relief. Will continue to progress ambulation distances as tolerated.     Follow Up Recommendations  Home health PT;Supervision for mobility/OOB     Equipment Recommendations  Rolling walker with 5" wheels;3in1 (PT) (bariatric and tall (pt 6'3"))    Recommendations for Other Services       Precautions / Restrictions Precautions Precautions: Fall;Other (comment) Precaution Comments: chole tube, x2 drains, watch O2 Restrictions Weight Bearing Restrictions: No    Mobility  Bed Mobility               General bed mobility comments: OOB in chair    Transfers Overall transfer level: Needs assistance Equipment used: Rolling walker (2 wheeled) Transfers: Sit to/from Stand Sit to Stand: Min assist         General transfer comment: Pt requiring minA to transition up to standing from recliner, however, was able to stand from toilet without physical assist  Ambulation/Gait Ambulation/Gait assistance: Min  guard Gait Distance (Feet): 25 Feet Assistive device: Rolling walker (2 wheeled) Gait Pattern/deviations: Step-through pattern;Decreased stride length;Trunk flexed Gait velocity: decreased   General Gait Details: Pt with increased trunk flexion, slow and guarded pace, min guard for safety   Stairs             Wheelchair Mobility    Modified Rankin (Stroke Patients Only)       Balance Overall balance assessment: Needs assistance Sitting-balance support: No upper extremity supported Sitting balance-[name] Scale: Good     Standing balance support: During functional activity;No upper extremity supported Standing balance-[name] Scale: Fair                              Cognition Arousal/Alertness: Awake/alert Behavior During Therapy: WFL for tasks assessed/performed Overall Cognitive Status: Within Functional Limits for tasks assessed                                        Exercises General Exercises - Lower Extremity Long Arc Quad: Both;10 reps;Seated    General Comments        Pertinent Vitals/Pain Pain Assessment: Faces Faces Pain Scale: Hurts a little bit Pain Location: abdomen Pain Descriptors / Indicators: Grimacing;Discomfort Pain Intervention(s): Limited activity within patient's tolerance;Monitored during session    Home Living                      Prior Function  PT Goals (current goals can now be found in the care plan section) Acute Rehab PT Goals Patient Stated Goal: return home PT Goal Formulation: With patient Time For Goal Achievement: 11/07/20 Potential to Achieve Goals: Good Progress towards PT goals: Progressing toward goals    Frequency    Min 3X/week      PT Plan Current plan remains appropriate    Co-evaluation              AM-PAC PT "6 Clicks" Mobility   Outcome Measure  Help needed turning from your back to your side while in a flat bed without using bedrails?: A  Little Help needed moving from lying on your back to sitting on the side of a flat bed without using bedrails?: A Little Help needed moving to and from a bed to a chair (including a wheelchair)?: A Little Help needed standing up from a chair using your arms (e.g., wheelchair or bedside chair)?: A Little Help needed to walk in hospital room?: A Little Help needed climbing 3-5 steps with a railing? : A Lot 6 Click Score: 17    End of Session   Activity Tolerance: Patient tolerated treatment well;Patient limited by fatigue Patient left: with call bell/phone within reach;in chair;with family/visitor present Nurse Communication: Mobility status PT Visit Diagnosis: Unsteadiness on feet (R26.81);Other abnormalities of gait and mobility (R26.89);Muscle weakness (generalized) (M62.81)     Time: [id number] PT Time Calculation (min) (ACUTE ONLY): 29 min  Charges:  $Therapeutic Activity: 23-37 mins                     Wyona Almas, PT, DPT Acute Rehabilitation Services Pager [phone] Office [phone]    Deno Etienne 10/26/2020, 12:55 PM

## 2020-10-27 DIAGNOSIS — E669 Obesity, unspecified: Secondary | ICD-10-CM

## 2020-10-27 LAB — CBC
HCT: 28.1 % — ABNORMAL LOW (ref 39.0–52.0)
Hemoglobin: 9.1 g/dL — ABNORMAL LOW (ref 13.0–17.0)
MCH: 31.3 pg (ref 26.0–34.0)
MCHC: 32.4 g/dL (ref 30.0–36.0)
MCV: 96.6 fL (ref 80.0–100.0)
Platelets: 325 10*3/uL (ref 150–400)
RBC: 2.91 MIL/uL — ABNORMAL LOW (ref 4.22–5.81)
RDW: 13.8 % (ref 11.5–15.5)
WBC: 14.1 10*3/uL — ABNORMAL HIGH (ref 4.0–10.5)
nRBC: 0 % (ref 0.0–0.2)

## 2020-10-27 LAB — BASIC METABOLIC PANEL
Anion gap: 9 (ref 5–15)
BUN: 24 mg/dL — ABNORMAL HIGH (ref 8–23)
CO2: 25 mmol/L (ref 22–32)
Calcium: 7.8 mg/dL — ABNORMAL LOW (ref 8.9–10.3)
Chloride: 98 mmol/L (ref 98–111)
Creatinine, Ser: 1.81 mg/dL — ABNORMAL HIGH (ref 0.61–1.24)
GFR, Estimated: 39 mL/min — ABNORMAL LOW (ref >60)
Glucose, Bld: 176 mg/dL — ABNORMAL HIGH (ref 70–99)
Potassium: 3.6 mmol/L (ref 3.5–5.1)
Sodium: 132 mmol/L — ABNORMAL LOW (ref 135–145)

## 2020-10-27 LAB — GLUCOSE, CAPILLARY
Glucose-Capillary: 173 mg/dL — ABNORMAL HIGH (ref 70–99)
Glucose-Capillary: 205 mg/dL — ABNORMAL HIGH (ref 70–99)
Glucose-Capillary: 185 mg/dL — ABNORMAL HIGH (ref 70–99)
Glucose-Capillary: 190 mg/dL — ABNORMAL HIGH (ref 70–99)

## 2020-10-27 MED ORDER — ADULT MULTIVITAMIN W/MINERALS CH
1.0000 | ORAL_TABLET | Freq: Every day | ORAL | Status: DC
  Administered 2020-10-28 – 2020-11-05 (×9): 1 via ORAL
  Filled 2020-10-27 (×9): qty 1

## 2020-10-27 MED ORDER — ENSURE MAX PROTEIN PO LIQD
11.0000 [oz_av] | Freq: Two times a day (BID) | ORAL | Status: DC
  Administered 2020-10-27 – 2020-11-03 (×6): 11 [oz_av] via ORAL
  Filled 2020-10-27 (×19): qty 330

## 2020-10-27 MED ORDER — POTASSIUM CHLORIDE CRYS ER 20 MEQ PO TBCR
20.0000 meq | EXTENDED_RELEASE_TABLET | Freq: Once | ORAL | Status: AC
  Administered 2020-10-27: 20 meq via ORAL
  Filled 2020-10-27: qty 1

## 2020-10-27 MED ORDER — ASCORBIC ACID 500 MG PO TABS
250.0000 mg | ORAL_TABLET | Freq: Two times a day (BID) | ORAL | Status: DC
  Administered 2020-10-27 – 2020-11-05 (×18): 250 mg via ORAL
  Filled 2020-10-27 (×18): qty 1

## 2020-10-27 NOTE — Progress Notes (Addendum)
PROGRESS NOTE    James Glover  [id number] DOB: November 19, 1945 DOA: 10/23/2020 PCP: [name], MD    Brief Narrative:  James Glover was admitted to the hospital the working diagnosis of acute/ subacute acalculus cholecystitis, complicated with liver abscess.  Now sp hepatic drains and cholecystostomy tube.   75 year old male past medical history for hypertension, type 2 diabetes mellitus, dyslipidemia, invasive prostate cancer status post brachytherapy now in remission, chronic kidney disease stage III.  Recent hospitalization 8/17-8/22/2022 for sepsis due to acute acalculous cholecystitis complicated by subcapsular liver abscess and S. Gallolyticus bacteremia.  He received intravenous antibiotic with Unasyn and discharged on Augmentin to complete 9/16.  Surgery recommended delay operative management.  He was discharged to a skilled nursing facility in stable condition. On the day of his hospitalization he had a follow-up right upper quadrant ultrasound/HIDA scan which showed gangrenous cholecystitis with a large liver abscess that prompted his surgeon to refer him to the hospital for IR cholecystostomy tube and liver abscess drainage. On his initial physical examination blood pressure 143/50, heart rate 81, respiratory rate 16, temperature 98.9, oxygen saturation 95%, his lungs are clear to auscultation bilaterally, heart S1-S2, present, rhythmic, his abdomen was tender in the right upper quadrant, no rebound or guarding, no lower extremity edema.   Sodium 133, potassium 3.8, chloride 99, bicarb 27, glucose 193, BUN 31, creatinine 1.67, white count 18.0, hemoglobin 10.3, hematocrit 31.2, platelets 242. SARS COVID-19 negative.   CT of the abdomen/pelvis with decreased pericholecystic edema suggesting slightly improved cholecystitis.  Complex intraparenchymal pericholecystic liver lesion likely abscess.  Multiple capsular-based fluid collections.   Patient was placed on IV antibiotic therapy  with good toleration.   08/26 percutaneous cholecystostomy tube, infrahepatic abscess drainage placement, superior perihepatic abscess drainage catheter and intrahepatic abscess aspiration.    Clinically patient has been improving after drains placed.  Plan to repeat CT abdomen and pelvis on 10/29/20 to evaluate abscess and if hepatic drains can be removed.   Plan to follow up as outpatient with Dr Constance Haw for eventual cholecystectomy.    Assessment & Plan:   Principal Problem:   Liver abscess Active Problems:   Essential hypertension, benign   Dyslipidemia   Uncontrolled type II diabetes mellitus with nephropathy (HCC)   Obesity (BMI 30-39.9)   HTN (hypertension)   Type II diabetes mellitus with nephropathy (HCC)   CKD (chronic kidney disease) stage 3, GFR 30-59 ml/min (HCC)   Prostate cancer (HCC)   Gangrenous cholecystitis   CKD stage 3 due to type 2 diabetes mellitus (HCC)   Cholecystitis   Pressure injury of skin   Acalculous cholecystis complicated with liver abscess, complicated with severe sepsis (end-organ failure AKI), present on admission.  SP 2 hepatic drains, cholecystostomy tube and intrahepatic abscess drain with good toleration.    Wbc is 14.1 today, he continue to be afebrile and his abdominal pain is improving.  Drains 70 ml documented.    Plan to continue with IV Zosyn for antibiotic therapy. Will get CT abdomen and pelvis on 08/31 before discharge.  Pain control with IV hydromorphone and PO tramadol Follow up with IR recommendations.   Transition to oral antibiotic therapy at discharge.  Eventual cholecystectomy per Dr Constance Haw as outpatient.    2. AKI on CKD stage 3a/ hyponatremia/ hypokalemia  Positive peripheral edema and mild dyspnea with exertion.    Renal function with serum cr at 1,8 with K at 3,6 and serum bicarbonate at 25, Na is down to 132.  Continue  to hold on IV fluids.  Continue HCTZ, add 20 meq Kcl. Follow up renal function in am,  consult nutrition for evaluation.    3. T2DM/ dyslipidemia  Stable glucose, today's fasting is 176 mg/dl. Poor oral intake. Continue with insulin sliding scale for glucose cover and monitoring. Continue basal insulin with 8 units qhs.  Holding on linagliptin/ pioglitazone   On simvastatin   4. Hx of prostate CA on remission. No signs of urinary retention.   5. Stage 2 left buttock pressure ulcer. Present on admission. skin care per protocol, continue to encourage mobility.    6. HTN.  continue amlodipine and hctz for blood pressure control.  Echocardiogram from 08/19 with preserved LV systolic function with no significant valvular disease.    7. Obesity class 2. Calculated BMI is 35.25    8. Chronic multifactorial anemia.  Stable cell count with hgb at 9,1 and hct t 28.1   Patient continue to be at high risk for worsening sepsis   Status is: Inpatient  Remains inpatient appropriate because:IV treatments appropriate due to intensity of illness or inability to take PO  Dispo: The patient is from: Home              Anticipated d/c is to: Home              Patient currently is not medically stable to d/c.   Difficult to place patient No   DVT prophylaxis: Enoxaparin   Code Status:    full  Family Communication:  I spoke with patient's wife and brother at the bedside, we talked in detail about patient's condition, plan of care and prognosis and all questions were addressed.       Skin Documentation: Pressure Injury 10/23/20 Buttocks Left Stage 2 -  Partial thickness loss of dermis presenting as a shallow open injury with a red, pink wound bed without slough. (Active)  10/23/20 1950  Location: Buttocks  Location Orientation: Left  Staging: Stage 2 -  Partial thickness loss of dermis presenting as a shallow open injury with a red, pink wound bed without slough.  Wound Description (Comments):   Present on Admission: Yes     Consultants:  IR Surgery   Procedures:   Liver abscess drain x1, and drainage tubes x2 Cholecystostomy tube     Antimicrobials:  Zosyn     Subjective: Patient with improvement in his abdominal pain but not yet back to baseline, no nausea or vomiting, no chest pain, but positive dyspnea with movement. Continue to be very weak and deconditioned   Objective: Vitals:   10/27/20 0323 10/27/20 0335 10/27/20 0832 10/27/20 1157  BP: (!) 143/62 (!) 155/62 (!) 150/56 106/78  Pulse: 72 72 71 71  Resp: '17 18 18 18  '$ Temp: 98.2 F (36.8 C) 98.2 F (36.8 C) 98.3 F (36.8 C) 98.6 F (37 C)  TempSrc: Oral Oral Oral Oral  SpO2: 95% 95% 94% 95%    Intake/Output Summary (Last 24 hours) at 10/27/2020 1246 Last data filed at 10/27/2020 1022 Gross per 24 hour  Intake 652.19 ml  Output 860 ml  Net -207.81 ml   There were no vitals filed for this visit.  Examination:   General: Not in pain or dyspnea  Neurology: Awake and alert, non focal  E ENT: mild pallor, no icterus, oral mucosa moist Cardiovascular: No JVD. S1-S2 present, rhythmic, no gallops, rubs, or murmurs. ++ non pitting bilateral lower extremity edema. Pulmonary: vesicular breath sounds bilaterally, adequate air movement, no  wheezing, rhonchi or rales. Gastrointestinal. Abdomen mild distended, drain in place,. No rebound or guarding Skin. No rashes Musculoskeletal: no joint deformities     Data Reviewed: I have personally reviewed following labs and imaging studies  CBC: Recent Labs  Lab 10/23/20 1122 10/24/20 0131 10/25/20 0226 10/26/20 0758 10/27/20 0809  WBC 18.0* 16.5* 26.2* 16.4* 14.1*  NEUTROABS 15.5*  --   --  14.2*  --   HGB 10.3* 10.0* 11.1* 9.5* 9.1*  HCT 31.2* 29.3* 34.2* 28.5* 28.1*  MCV 97.8 95.1 97.7 95.6 96.6  PLT 242 278 331 333 [id number]   Basic Metabolic Panel: Recent Labs  Lab 10/23/20 1122 10/24/20 0131 10/25/20 0226 10/26/20 0758 10/27/20 0809  NA 133* 133* 134* 134* 132*  K 3.8 3.5 4.0 3.9 3.6  CL 99 101 100 101 98  CO2 '27 24 24  23 25  '$ GLUCOSE 193* 164* 169* 176* 176*  BUN 31* 25* 22 28* 24*  CREATININE 1.67* 1.46* 1.66* 1.91* 1.81*  CALCIUM 8.2* 7.9* 8.2* 8.0* 7.8*   GFR: Estimated Creatinine Clearance: 51.6 mL/min (A) (by C-G formula based on SCr of 1.81 mg/dL (H)). Liver Function Tests: Recent Labs  Lab 10/23/20 1122 10/24/20 0131  AST 31 34  ALT 38 41  ALKPHOS 74 69  BILITOT 0.7 0.7  PROT 5.4* 5.0*  ALBUMIN 1.9* 1.8*   No results for input(s): LIPASE, AMYLASE in the last 168 hours. No results for input(s): AMMONIA in the last 168 hours. Coagulation Profile: No results for input(s): INR, PROTIME in the last 168 hours. Cardiac Enzymes: No results for input(s): CKTOTAL, CKMB, CKMBINDEX, TROPONINI in the last 168 hours. BNP (last 3 results) No results for input(s): PROBNP in the last 8760 hours. HbA1C: No results for input(s): HGBA1C in the last 72 hours. CBG: Recent Labs  Lab 10/26/20 1212 10/26/20 1605 10/26/20 2027 10/27/20 0836 10/27/20 1206  GLUCAP 203* 228* 187* 173* 205*   Lipid Profile: No results for input(s): CHOL, HDL, LDLCALC, TRIG, CHOLHDL, LDLDIRECT in the last 72 hours. Thyroid Function Tests: No results for input(s): TSH, T4TOTAL, FREET4, T3FREE, THYROIDAB in the last 72 hours. Anemia Panel: No results for input(s): VITAMINB12, FOLATE, FERRITIN, TIBC, IRON, RETICCTPCT in the last 72 hours.    Radiology Studies: I have reviewed all of the imaging during this hospital visit personally     Scheduled Meds:  amLODipine  5 mg Oral Daily   aspirin EC  81 mg Oral Daily   enoxaparin (LOVENOX) injection  40 mg Subcutaneous Q24H   famotidine  40 mg Oral QHS   hydrochlorothiazide  25 mg Oral Daily   insulin aspart  0-15 Units Subcutaneous TID WC   insulin glargine-yfgn  8 Units Subcutaneous QHS   oxybutynin  5 mg Oral BID   pantoprazole  40 mg Oral Daily   saccharomyces boulardii  250 mg Oral BID   simvastatin  20 mg Oral QHS   sodium chloride flush  5 mL Intracatheter  Q8H   tamsulosin  0.4 mg Oral BID PC   Continuous Infusions:  piperacillin-tazobactam (ZOSYN)  IV 3.375 g (10/27/20 0504)     LOS: 4 days        Rim Thatch Gerome Apley, MD

## 2020-10-27 NOTE — Consult Note (Signed)
   [facility] Inpatient Consult   10/27/2020  James Glover 1945-07-14 [id number]  [place] [facility] [ACO] Patient: [name]   Primary Care Provider:  [name], MD [facility]  Patient screened for readmission less than 7 days hospitalization with noted high risk score for unplanned readmission risk and  to assess for potential [place] Management service needs for post hospital transition.  Review of patient's medical record reveals patient is recommended for home with home health per PT notes yesterday.  Plan:  Continue to follow progress and disposition to assess for post hospital care management needs.    For questions contact:   Natividad Brood, RN BSN [facility] Liaison  [phone] business mobile phone Toll free office [phone]  Fax number: [phone] [email or web address]

## 2020-10-27 NOTE — Progress Notes (Signed)
Initial Nutrition Assessment  DOCUMENTATION CODES:   Obesity unspecified  INTERVENTION:   Ensure Max protein supplement BID, each supplement provides 150kcal and 30g of protein.  MVI po daily   Vitamin C 222m po BID  Pt at moderate refeed risk; recommend monitor potassium, magnesium and phosphorus labs daily until stable  Low fat diet education   NUTRITION DIAGNOSIS:   Inadequate oral intake related to acute illness as evidenced by per patient/family report.  GOAL:   Patient will meet greater than or equal to 90% of their needs  MONITOR:   PO intake, Supplement acceptance, Labs, Weight trends, Skin, I & O's  REASON FOR ASSESSMENT:   Consult Assessment of nutrition requirement/status  ASSESSMENT:   75y.o. male with medical history significant for recently diagnosed acalculous cholecystitis with subcapsular liver abscess and strep gallolyticus bacteremia, HTN, IDDM, HLD, invasive prostate cancer status post brachytherapy in 2020 now in remission, CKD stage III, GERD and BPH who is admittted with worsening of cholecystitis and liver abscess now s/p IR drain placement 8/26  -Pt s/p HIDA scan 8/25 and s/p IR drain 8/26  Met with pt and family in room today. Pt reports good appetite and oral intake at baseline but reports that his appetite has been decreased over the past two weeks r/t the ongoing issues with his gallbladder. Pt is documented to be eating 25-100% of meals in hospital. Pt ate 25% of his breakfast this morning and ate 1/2 a bowl of broth and drank some tea for lunch. RD discussed with patient the importance of adequate nutrition needed to preserve lean muscle. RD also discussed low fat diet education with pt. Pt reports that he is willing to drink chocolate supplements in hospital. RD will add Ensure Max as this is low in fat and carbohydrate. Of note, pt wears upper dentures. RD will add vitamins to help pt meet his estimated needs and to support wound healing.  Per chart, pt with weight gain pta; pt is noted to have moderate edema in his BLE. Pt's UBW is around 275lbs.   Drains with 761moutput.   Medications reviewed and include: aspirin, lovenox. Pepcid, insulin, protonix, Kcl, florastor, zosyn   Labs reviewed: Na 132(L(, BUN 24(H), creat 1.81(H) Wbc- 14.1(H), Hgb 9.1(L), Hct 28.1(L) Cbgs- 173, 205 x 24 hrs AIC 7.6(H)- 8/25  NUTRITION - FOCUSED PHYSICAL EXAM:  Flowsheet Row Most Recent Value  Orbital Region No depletion  Upper Arm Region No depletion  Thoracic and Lumbar Region No depletion  Buccal Region No depletion  Temple Region No depletion  Clavicle Bone Region No depletion  Clavicle and Acromion Bone Region No depletion  Scapular Bone Region No depletion  Dorsal Hand No depletion  Patellar Region No depletion  Anterior Thigh Region No depletion  Posterior Calf Region No depletion  Edema (RD Assessment) Moderate  Hair Reviewed  Eyes Reviewed  Mouth Reviewed  Skin Reviewed  Nails Reviewed   Diet Order:   Diet Order             DIET SOFT Room service appropriate? Yes; Fluid consistency: Thin  Diet effective now                  EDUCATION NEEDS:   Education needs have been addressed  Skin:  Skin Assessment: Reviewed RN Assessment (Stage II buttocks)  Last BM:  8/29- type 7  Height:   Ht Readings from Last 1 Encounters:  10/21/20 _0  (1.905 m)    Weight:  Wt Readings from Last 1 Encounters:  10/27/20 133.8 kg    Ideal Body Weight:  89 kg  BMI:  Body mass index is 36.87 kg/m.  Estimated Nutritional Needs:   Kcal:  2700-3000kcal/day  Protein:  >135g/day  Fluid:  2.4-2.7L/day  Koleen Distance MS, RD, LDN Please refer to [facility] for RD and/or RD on-call/weekend/after hours pager

## 2020-10-27 NOTE — Progress Notes (Signed)
PT Cancellation Note  Patient Details Name: James Glover MRN: [id number] DOB: 1945-07-22   Cancelled Treatment:    Reason Eval/Treat Not Completed: Other (comment) Pt declining therapy session due to fatigue. Max encouragement provided on benefits on out of bed mobility and expected progression. Pt verbalized understanding, but ultimately still refused. Agreeable to participate in AM.  Wyona Almas, PT, DPT Acute Rehabilitation Services Pager [phone] Office [phone]    James Glover 10/27/2020, 3:51 PM

## 2020-10-27 NOTE — Progress Notes (Signed)
   Subjective/Chief Complaint: Decreased abdominal pain BM x 2 yesterday WBC continues to improve Tolerating soft diet   Objective: Vital signs in last 24 hours: Temp:  [98.1 F (36.7 C)-99.6 F (37.6 C)] 98.3 F (36.8 C) (08/29 0832) Pulse Rate:  [71-95] 71 (08/29 0832) Resp:  [17-20] 18 (08/29 0832) BP: (142-163)/(48-62) 150/56 (08/29 0832) SpO2:  [93 %-99 %] 94 % (08/29 0832) Last BM Date: 10/26/20  Intake/Output from previous day: 08/28 0701 - 08/29 0700 In: 1132.2 [P.O.:940; IV Piggyback:162.2] Out: 970 [Urine:900; Drains:70] Intake/Output this shift: Total I/O In: -  Out: 100 [Urine:100]  General appearance: alert, cooperative, and no distress GI: soft, minimal RUQ tenderness around drains; bilious perc GB tube; serous JP output  Lab Results:  Recent Labs    10/26/20 0758 10/27/20 0809  WBC 16.4* 14.1*  HGB 9.5* 9.1*  HCT 28.5* 28.1*  PLT 333 325   BMET Recent Labs    10/25/20 0226 10/26/20 0758  NA 134* 134*  K 4.0 3.9  CL 100 101  CO2 24 23  GLUCOSE 169* 176*  BUN 22 28*  CREATININE 1.66* 1.91*  CALCIUM 8.2* 8.0*   PT/INR No results for input(s): LABPROT, INR in the last 72 hours. ABG No results for input(s): PHART, HCO3 in the last 72 hours.  Invalid input(s): PCO2, PO2  Studies/Results: No results found.  Anti-infectives: Anti-infectives (From admission, onward)    Start     Dose/Rate Route Frequency Ordered Stop   10/23/20 2200  piperacillin-tazobactam (ZOSYN) IVPB 3.375 g        3.375 g 12.5 mL/hr over 240 Minutes Intravenous Every 8 hours 10/23/20 1324     10/23/20 1245  piperacillin-tazobactam (ZOSYN) IVPB 3.375 g        3.375 g 100 mL/hr over 30 Minutes Intravenous  Once 10/23/20 1234 10/23/20 1523       Assessment/Plan: Acute cholecystitis - gangrenous Pericholecystic/ perihepatic fluid collections   S/p percutaneous cholecystostomy S/p percutaneous drainage of two perihepatic fluid collections with aspiration of a  possible hepatic abscess   Continue antibiotics - consider transition to PO Augmentin as WBC normalizes.  Progressing towards discharge.  If he is still here in two days, repeat CT scan to evaluate abscesses to see if any of the other drains can be removed.  Will follow along peripherally, in case patient's status worsens and he needs urgent surgery.  For now, he seems to be improved with drains in place.    Follow-up with Dr. Constance Haw for eventual interval cholecystectomy.  LOS: 4 days    [name] 10/27/2020

## 2020-10-27 NOTE — TOC Initial Note (Signed)
Transition of Care [facility]) - Initial/Assessment Note    Patient Details  Name: James Glover MRN: [id number] Date of Birth: [birth year]  [facility] CM/SW Contact:    Marilu Favre, RN Phone Number: 10/27/2020, 2:55 PM  Clinical Narrative:                 Patient [facility] at [facility] for short term rehab. PT recommended HHPT and 24 hour supervision. NCM discussed same with patient, wife and brother at bedside.    Patient wanting to go home at discharge. NCM discussed PT note from yesterday with patient and wife and brother.   Patient wants a walker for home but does not want a 3 in 1.   Wife will be present at bedside this afternoon to see how he does with PT. PT aware.   Expected Discharge Plan: [place]     Patient Goals and CMS Choice Patient states their goals for this hospitalization and ongoing recovery are:: to go home CMS Medicare.gov Compare Post Acute Care list provided to:: Patient Choice offered to / list presented to : Patient  Expected Discharge Plan and Services Expected Discharge Plan: [place] Choice: Home Health, Durable Medical Equipment Living arrangements for the past 2 months: Single Family Home                                      Prior Living Arrangements/Services Living arrangements for the past 2 months: Single Family Home Lives with:: Spouse Patient language and need for interpreter reviewed:: Yes Do you feel safe going back to the place where you live?: Yes      Need for Family Participation in Patient Care: Yes (Comment) Care giver support system in place?: Yes (comment)   Criminal Activity/Legal Involvement Pertinent to Current Situation/Hospitalization: No - Comment as needed  Activities of Daily Living      Permission Sought/Granted   Permission granted to share information with : Yes, Verbal Permission Granted  Share Information with NAME: wife Leatrice Jewels and  brother Doren Custard           Emotional Assessment Appearance:: Appears stated age Attitude/Demeanor/Rapport: Engaged Affect (typically observed): Accepting Orientation: : Oriented to Self, Oriented to Place, Oriented to  Time, Oriented to Situation Alcohol / Substance Use: Not Applicable Psych Involvement: No (comment)  Admission diagnosis:  Cholecystitis [K81.9] Gangrenous cholecystitis [K81.0] Patient Active Problem List   Diagnosis Date Noted   Pressure injury of skin 10/24/2020   Cholecystitis 10/23/2020   Sepsis ([place]) 10/21/2020   CKD stage 3 due to type 2 diabetes mellitus ([place]) 10/21/2020   Hyperlipidemia associated with type 2 diabetes mellitus ([place]) 10/21/2020   BPH without urinary obstruction 10/21/2020   Thrombocytopenia ([place]) 10/21/2020   Anemia with chronic illness 10/21/2020   Gangrenous cholecystitis 10/16/2020   GERD without esophagitis 10/16/2020   Liver abscess    Aortic atherosclerosis ([place]) 02/24/2020   Controlled type 2 diabetes mellitus with stage 3 chronic kidney disease, with long-term current use of insulin ([place]) 02/20/2020   Secondary hyperparathyroidism of renal origin ([place]) 02/20/2020   Anemia due to vitamin B12 deficiency 08/08/2019   Prostate cancer ([place]) 06/14/2018   CKD (chronic kidney disease) stage 3, GFR 30-59 ml/min ([place]) 02/01/2018   Type II diabetes mellitus with nephropathy ([place]) 02/15/2013   HTN (hypertension) 01/27/2013   Obesity (  BMI 30-39.9) 10/02/2012   Uncontrolled type II diabetes mellitus with nephropathy ([place]) 06/21/2012   Essential hypertension, benign 07/29/2011   Male hypogonadism 07/29/2011   Dyslipidemia 07/29/2011   PCP:  [name], MD Pharmacy:   [facility] [place], [place] - [address] [place] [address] [place] [address] [place] [address] Phone: [phone] Fax: [phone]     Social Determinants of Health (SDOH) Interventions    Readmission Risk  Interventions Readmission Risk Prevention Plan 10/16/2020  Transportation Screening Complete  HRI or Home Care Consult Complete  Social Work Consult for [place] Planning/Counseling Complete  Palliative Care Screening Not Applicable  Medication Review Press photographer) Complete  Some recent data might be hidden

## 2020-10-28 LAB — CBC
HCT: 28.6 % — ABNORMAL LOW (ref 39.0–52.0)
Hemoglobin: 9.6 g/dL — ABNORMAL LOW (ref 13.0–17.0)
MCH: 31.8 pg (ref 26.0–34.0)
MCHC: 33.6 g/dL (ref 30.0–36.0)
MCV: 94.7 fL (ref 80.0–100.0)
Platelets: 333 10*3/uL (ref 150–400)
RBC: 3.02 MIL/uL — ABNORMAL LOW (ref 4.22–5.81)
RDW: 13.7 % (ref 11.5–15.5)
WBC: 13.9 10*3/uL — ABNORMAL HIGH (ref 4.0–10.5)
nRBC: 0 % (ref 0.0–0.2)

## 2020-10-28 LAB — GLUCOSE, CAPILLARY
Glucose-Capillary: 176 mg/dL — ABNORMAL HIGH (ref 70–99)
Glucose-Capillary: 201 mg/dL — ABNORMAL HIGH (ref 70–99)
Glucose-Capillary: 210 mg/dL — ABNORMAL HIGH (ref 70–99)
Glucose-Capillary: 163 mg/dL — ABNORMAL HIGH (ref 70–99)

## 2020-10-28 LAB — BASIC METABOLIC PANEL
Anion gap: 9 (ref 5–15)
BUN: 21 mg/dL (ref 8–23)
CO2: 24 mmol/L (ref 22–32)
Calcium: 7.8 mg/dL — ABNORMAL LOW (ref 8.9–10.3)
Chloride: 97 mmol/L — ABNORMAL LOW (ref 98–111)
Creatinine, Ser: 1.68 mg/dL — ABNORMAL HIGH (ref 0.61–1.24)
GFR, Estimated: 42 mL/min — ABNORMAL LOW (ref >60)
Glucose, Bld: 191 mg/dL — ABNORMAL HIGH (ref 70–99)
Potassium: 4 mmol/L (ref 3.5–5.1)
Sodium: 130 mmol/L — ABNORMAL LOW (ref 135–145)

## 2020-10-28 LAB — CULTURE, BLOOD (ROUTINE X 2)
Culture: NO GROWTH
Culture: NO GROWTH

## 2020-10-28 LAB — HEMOGLOBIN AND HEMATOCRIT, BLOOD
HCT: 28.6 % — ABNORMAL LOW (ref 39.0–52.0)
Hemoglobin: 9.5 g/dL — ABNORMAL LOW (ref 13.0–17.0)

## 2020-10-28 MED ORDER — INSULIN GLARGINE-YFGN 100 UNIT/ML ~~LOC~~ SOLN
12.0000 [IU] | Freq: Every day | SUBCUTANEOUS | Status: DC
  Administered 2020-10-28 – 2020-11-04 (×8): 12 [IU] via SUBCUTANEOUS
  Filled 2020-10-28 (×9): qty 0.12

## 2020-10-28 MED ORDER — LOPERAMIDE HCL 2 MG PO CAPS
2.0000 mg | ORAL_CAPSULE | Freq: Once | ORAL | Status: AC
  Administered 2020-10-30: 2 mg via ORAL
  Filled 2020-10-28 (×2): qty 1

## 2020-10-28 NOTE — Progress Notes (Addendum)
OT Cancellation Note  Patient Details Name: James Glover MRN: [id number] DOB: March 20, 1945   Cancelled Treatment:    Reason Eval/Treat Not Completed: Fatigue/lethargy limiting ability to participate (Pt reports multiple bouts of diarrhea and refusing to get OOB with therapy despite max encouragement and education. Will f/u with OT treatment as pt and time permit)  Addendum: OT treatment attempted several times this afternoon, pt asking OT to come back each time with the promise to transfer OOB. Final attempt, pt continues to refuse. OT to f/u as pt allows.   Shanecia Hoganson A Jamier Urbas 10/28/2020, 2:11 PM

## 2020-10-28 NOTE — Progress Notes (Signed)
PROGRESS NOTE    James Glover  [id number] DOB: [birth year] DOA: 10/23/2020 PCP: [name], MD   Brief Narrative:  This 75 years old male with PMH significant for hypertension, type 2 diabetes, dyslipidemia, invasive prostate cancer s/p brachytherapy now in remission, chronic kidney disease stage IIIb, recent hospitalization from 8/17-8/22 for sepsis due to acute acalculous cholecystitis complicated by subcapsular liver abscesses and S. Gallolyticus bacteremia.  Patient has received intravenous antibiotics with Unasyn and discharged on Augmentin to complete antibiotic course until 9/16. General Surgery recommended delay operative management.  He was discharged to skilled nursing facility in stable condition.   On the day of hospitalization he had a follow-up right upper quadrant HIDA scan which showed gangrenous cholecystitis with a large liver abscess that prompted his surgeon to refer him to the hospital for IR cholecystostomy tube and liver abscess drainage. CT abdomen pelvis showed decrease pericholecystic edema suggesting highly improved cholecystitis.  Complex intraparenchymal pericholecystic liver lesion likely abscess.  Patient was started on IV antibiotic therapy. On 8/26 percutaneous cholecystostomy tube and intrahepatic drainage tube placed.  Patient is clinically improved after the drains are placed.   Plan is to repeat CT abdomen and pelvis on 8/31 to evaluate abscess and if therapeutic drain can be removed. Plan is for outpatient follow-up with Dr. Constance Haw for eventual cholecystectomy.   Assessment & Plan:   Principal Problem:   Liver abscess Active Problems:   Essential hypertension, benign   Dyslipidemia   Uncontrolled type II diabetes mellitus with nephropathy (HCC)   Obesity (BMI 30-39.9)   HTN (hypertension)   Type II diabetes mellitus with nephropathy (HCC)   CKD (chronic kidney disease) stage 3, GFR 30-59 ml/min (HCC)   Prostate cancer (HCC)   Gangrenous  cholecystitis   CKD stage 3 due to type 2 diabetes mellitus ([place])   Cholecystitis   Pressure injury of skin  Severe sepsis secondary to liver abscess: Acalculous cholecystitis complicated with liver abscess: Patient presented with abdominal pain.   HIDA scan consistent with gangrenous cholecystitis with large liver abscess. S/p 2 hepatic drains, cholecystostomy tube and intrahepatic abscess drain with good toleration. Patient remains afebrile,  abdominal pain is improving,  leukocytosis is improving. Plan is to continue IV Zosyn now, transition to oral antibiotics at DC. Continue pain control with IV Dilaudid and p.o. tramadol Plan is to repeat CT abdomen and pelvis on 8/31 before discharge. Follow-up with Dr. Constance Haw as an outpatient for eventual cholecystectomy. Sepsis physiology improving.  AKI on CKD stage IIIa: Baseline creatinine remains between 1.2-1.4. Serum creatinine at admission 1.8, improving. Avoid nephrotoxic medications.   Dyslipidemia :   Continue simvastatin  Type 2 diabetes:   Continue regular insulin sliding scale,  Continue Semglee 8 units at bedtime. Hold on p.o. diabetic medications  History of prostate cancer in remission: no signs of urinary retention  Hypertension: Continue amlodipine and hydrochlorothiazide.  Chronic multifactorial anemia: Hemoglobin is stable.  Stage II left buttock pressure ulcer: Encourage mobility.  DVT prophylaxis: Lovenox Code Status: Full code. Family Communication: No family at bed side. Disposition Plan:   Status is: Inpatient  Remains inpatient appropriate because:Inpatient level of care appropriate due to severity of illness  Dispo: The patient is from: Home              Anticipated d/c is to: Home              Patient currently is not medically stable to d/c.   Difficult to place patient No  Consultants:  General surgery, IR  Procedures: Percutaneous cholecystostomy tube,  bilateral biliary  drains. Antimicrobials:   Anti-infectives (From admission, onward)    Start     Dose/Rate Route Frequency Ordered Stop   10/23/20 2200  piperacillin-tazobactam (ZOSYN) IVPB 3.375 g        3.375 g 12.5 mL/hr over 240 Minutes Intravenous Every 8 hours 10/23/20 1324     10/23/20 1245  piperacillin-tazobactam (ZOSYN) IVPB 3.375 g        3.375 g 100 mL/hr over 30 Minutes Intravenous  Once 10/23/20 1234 10/23/20 1523       Subjective: Patient was seen and examined at bedside.  Overnight events noted.  He was sitting comfortably on the chair,  reports having abdominal discomfort.  Drains been working fine,  reports having loose watery stools,  now becoming formed.  Objective: Vitals:   10/27/20 1932 10/27/20 2316 10/28/20 0351 10/28/20 0814  BP: (!) 158/65 (!) 153/47 (!) 148/58 (!) 167/59  Pulse: 88 85 84 79  Resp: '15 18 18 17  '$ Temp: 98.7 F (37.1 C) 98.4 F (36.9 C) 98.8 F (37.1 C) 98.7 F (37.1 C)  TempSrc: Oral Oral Oral Oral  SpO2: 93% 95% 95% 91%  Weight:        Intake/Output Summary (Last 24 hours) at 10/28/2020 1504 Last data filed at 10/28/2020 0808 Gross per 24 hour  Intake 1044.59 ml  Output 1161 ml  Net -116.41 ml   Filed Weights   10/27/20 1335  Weight: 133.8 kg    Examination:  General exam: Appears comfortable,  sitting on the chair.  Not in any acute distress. Respiratory system: Clear to auscultation. Respiratory effort normal. Cardiovascular system: S1 & S2 heard, RRR. No JVD, murmurs, rubs, gallops or clicks. No pedal edema. Gastrointestinal system: Abdomen is soft, mildly tender, nondistended.  Bowel sounds+, biliary drains noted. Central nervous system: Alert and oriented. No focal neurological deficits. Extremities: No edema, no cyanosis, no clubbing. Skin: No rashes, lesions or ulcers Psychiatry: Judgement and insight appear normal. Mood & affect appropriate.     Data Reviewed: I have personally reviewed following labs and imaging  studies  CBC: Recent Labs  Lab 10/23/20 1122 10/24/20 0131 10/25/20 0226 10/26/20 0758 10/27/20 0809 10/28/20 0733  WBC 18.0* 16.5* 26.2* 16.4* 14.1* 13.9*  NEUTROABS 15.5*  --   --  14.2*  --   --   HGB 10.3* 10.0* 11.1* 9.5* 9.1* 9.6*  HCT 31.2* 29.3* 34.2* 28.5* 28.1* 28.6*  MCV 97.8 95.1 97.7 95.6 96.6 94.7  PLT 242 278 331 333 325 0000000   Basic Metabolic Panel: Recent Labs  Lab 10/24/20 0131 10/25/20 0226 10/26/20 0758 10/27/20 0809 10/28/20 0733  NA 133* 134* 134* 132* 130*  K 3.5 4.0 3.9 3.6 4.0  CL 101 100 101 98 97*  CO2 '24 24 23 25 24  '$ GLUCOSE 164* 169* 176* 176* 191*  BUN 25* 22 28* 24* 21  CREATININE 1.46* 1.66* 1.91* 1.81* 1.68*  CALCIUM 7.9* 8.2* 8.0* 7.8* 7.8*   GFR: Estimated Creatinine Clearance: 56.9 mL/min (A) (by C-G formula based on SCr of 1.68 mg/dL (H)). Liver Function Tests: Recent Labs  Lab 10/23/20 1122 10/24/20 0131  AST 31 34  ALT 38 41  ALKPHOS 74 69  BILITOT 0.7 0.7  PROT 5.4* 5.0*  ALBUMIN 1.9* 1.8*   No results for input(s): LIPASE, AMYLASE in the last 168 hours. No results for input(s): AMMONIA in the last 168 hours. Coagulation Profile: No results for input(s):  INR, PROTIME in the last 168 hours. Cardiac Enzymes: No results for input(s): CKTOTAL, CKMB, CKMBINDEX, TROPONINI in the last 168 hours. BNP (last 3 results) No results for input(s): PROBNP in the last 8760 hours. HbA1C: No results for input(s): HGBA1C in the last 72 hours. CBG: Recent Labs  Lab 10/27/20 1206 10/27/20 1639 10/27/20 2057 10/28/20 0814 10/28/20 1202  GLUCAP 205* 185* 190* 176* 201*   Lipid Profile: No results for input(s): CHOL, HDL, LDLCALC, TRIG, CHOLHDL, LDLDIRECT in the last 72 hours. Thyroid Function Tests: No results for input(s): TSH, T4TOTAL, FREET4, T3FREE, THYROIDAB in the last 72 hours. Anemia Panel: No results for input(s): VITAMINB12, FOLATE, FERRITIN, TIBC, IRON, RETICCTPCT in the last 72 hours. Sepsis Labs: No results for  input(s): PROCALCITON, LATICACIDVEN in the last 168 hours.  Recent Results (from the past 240 hour(s))  Culture, blood (routine x 2)     Status: None   Collection Time: 10/23/20 12:35 PM   Specimen: Site Not Specified; Blood  Result Value Ref Range Status   Specimen Description SITE NOT SPECIFIED  Final   Special Requests   Final    BOTTLES DRAWN AEROBIC ONLY Blood Culture results may not be optimal due to an inadequate volume of blood received in culture bottles   Culture   Final    NO GROWTH 5 DAYS Performed at [facility], [place] [address], [place], [place] [address]    Report Status 10/28/2020 FINAL  Final  Culture, blood (routine x 2)     Status: None   Collection Time: 10/23/20 12:40 PM   Specimen: Site Not Specified; Blood  Result Value Ref Range Status   Specimen Description SITE NOT SPECIFIED  Final   Special Requests   Final    BOTTLES DRAWN AEROBIC ONLY Blood Culture results may not be optimal due to an inadequate volume of blood received in culture bottles   Culture   Final    NO GROWTH 5 DAYS Performed at [facility], [place] [address], [place], [place] [address]    Report Status 10/28/2020 FINAL  Final  Resp Panel by RT-PCR (Flu A&B, Covid) Nasopharyngeal Swab     Status: None   Collection Time: 10/23/20  2:27 PM   Specimen: Nasopharyngeal Swab; Nasopharyngeal(NP) swabs in vial transport medium  Result Value Ref Range Status   SARS Coronavirus 2 by RT PCR NEGATIVE NEGATIVE Final    Comment: (NOTE) SARS-CoV-2 target nucleic acids are NOT DETECTED.  The SARS-CoV-2 RNA is generally detectable in upper respiratory specimens during the acute phase of infection. The lowest concentration of SARS-CoV-2 viral copies this assay can detect is 138 copies/mL. A negative result does not preclude SARS-Cov-2 infection and should not be used as the sole basis for treatment or other patient management decisions. A negative result may occur with  improper specimen  collection/handling, submission of specimen other than nasopharyngeal swab, presence of viral mutation(s) within the areas targeted by this assay, and inadequate number of viral copies(<138 copies/mL). A negative result must be combined with clinical observations, patient history, and epidemiological information. The expected result is Negative.  Fact Sheet for Patients:  EntrepreneurPulse.com.au  Fact Sheet for Healthcare Providers:  IncredibleEmployment.be  This test is no t yet approved or cleared by the [place] FDA and  has been authorized for detection and/or diagnosis of SARS-CoV-2 by FDA under an Emergency Use Authorization (EUA). This EUA will remain  in effect (meaning this test can be used) for the duration of  the COVID-19 declaration under Section 564(b)(1) of the Act, 21 U.S.C.section 360bbb-3(b)(1), unless the authorization is terminated  or revoked sooner.       Influenza A by PCR NEGATIVE NEGATIVE Final   Influenza B by PCR NEGATIVE NEGATIVE Final    Comment: (NOTE) The Xpert Xpress SARS-CoV-2/FLU/RSV plus assay is intended as an aid in the diagnosis of influenza from Nasopharyngeal swab specimens and should not be used as a sole basis for treatment. Nasal washings and aspirates are unacceptable for Xpert Xpress SARS-CoV-2/FLU/RSV testing.  Fact Sheet for Patients: EntrepreneurPulse.com.au  Fact Sheet for Healthcare Providers: IncredibleEmployment.be  This test is not yet approved or cleared by the [place] FDA and has been authorized for detection and/or diagnosis of SARS-CoV-2 by FDA under an Emergency Use Authorization (EUA). This EUA will remain in effect (meaning this test can be used) for the duration of the COVID-19 declaration under Section 564(b)(1) of the Act, 21 U.S.C. section 360bbb-3(b)(1), unless the authorization is terminated or revoked.  Performed at [facility], [place] [address], [place], [place] [address]   Surgical pcr screen     Status: None   Collection Time: 10/24/20 10:10 AM   Specimen: Nasal Mucosa; Nasal Swab  Result Value Ref Range Status   MRSA, PCR NEGATIVE NEGATIVE Final   Staphylococcus aureus NEGATIVE NEGATIVE Final    Comment: (NOTE) The Xpert SA Assay (FDA approved for NASAL specimens in patients 82 years of age and older), is one component of a comprehensive surveillance program. It is not intended to diagnose infection nor to guide or monitor treatment. Performed at [facility], [place] [address], [place], [place] [address]   Aerobic/Anaerobic Culture w Gram Stain (surgical/deep wound)     Status: None (Preliminary result)   Collection Time: 10/24/20  5:15 PM   Specimen: Fluid; Abscess  Result Value Ref Range Status   Specimen Description FLUID  Final   Special Requests PERIHEPATIC FLUID NO1  Final   Gram Stain NO WBC SEEN NO ORGANISMS SEEN   Final   Culture   Final    NO GROWTH 3 DAYS Performed at [facility], [address] [place] [address], [place], [place] [address]    Report Status PENDING  Incomplete  Aerobic/Anaerobic Culture w Gram Stain (surgical/deep wound)     Status: None (Preliminary result)   Collection Time: 10/24/20  5:15 PM   Specimen: Fluid; Abscess  Result Value Ref Range Status   Specimen Description FLUID  Final   Special Requests PERIHEPATIC NO 2  Final   Gram Stain   Final    RARE WBC PRESENT,BOTH PMN AND MONONUCLEAR NO ORGANISMS SEEN    Culture   Final    NO GROWTH 3 DAYS Performed at [facility], [address] [place], [place] [address]    Report Status PENDING  Incomplete  Aerobic/Anaerobic Culture w Gram Stain (surgical/deep wound)     Status: None (Preliminary result)   Collection Time: 10/24/20  5:15 PM   Specimen: Abscess  Result Value Ref Range Status   Specimen Description ABSCESS  Final   Special Requests PERIHEPATIC  Final   Gram Stain   Final    FEW WBC  PRESENT,BOTH PMN AND MONONUCLEAR NO ORGANISMS SEEN    Culture   Final    NO GROWTH 3 DAYS NO ANAEROBES ISOLATED; CULTURE IN PROGRESS FOR 5 DAYS Performed at [facility], [place] [address], [place], [place] [address]    Report Status  PENDING  Incomplete    Radiology Studies: No results found.  Scheduled Meds:  amLODipine  5 mg Oral Daily   vitamin C  250 mg Oral BID   aspirin EC  81 mg Oral Daily   enoxaparin (LOVENOX) injection  40 mg Subcutaneous Q24H   famotidine  40 mg Oral QHS   hydrochlorothiazide  25 mg Oral Daily   insulin aspart  0-15 Units Subcutaneous TID WC   insulin glargine-yfgn  12 Units Subcutaneous QHS   loperamide  2 mg Oral Once   multivitamin with minerals  1 tablet Oral Daily   oxybutynin  5 mg Oral BID   pantoprazole  40 mg Oral Daily   Ensure Max Protein  11 oz Oral BID   saccharomyces boulardii  250 mg Oral BID   simvastatin  20 mg Oral QHS   sodium chloride flush  5 mL Intracatheter Q8H   tamsulosin  0.4 mg Oral BID PC   Continuous Infusions:  piperacillin-tazobactam (ZOSYN)  IV 3.375 g (10/28/20 0511)     LOS: 5 days    Time spent: 35 mins    Iola Turri, MD [facility]   If 7PM-7AM, please contact night-coverage

## 2020-10-28 NOTE — Progress Notes (Signed)
Inpatient Diabetes Program Recommendations  AACE/ADA: New Consensus Statement on Inpatient Glycemic Control (2015)  Target Ranges:  Prepandial:   less than 140 mg/dL      Peak postprandial:   less than 180 mg/dL (1-2 hours)      Critically ill patients:  140 - 180 mg/dL   Lab Results  Component Value Date   GLUCAP 176 (H) 10/28/2020   HGBA1C 7.6 (H) 10/23/2020    Review of Glycemic Control Results for James Glover, James Glover (MRN [id number]) as of 10/28/2020 11:29  Ref. Range 10/27/2020 08:36 10/27/2020 12:06 10/27/2020 16:39 10/27/2020 20:57 10/28/2020 08:14  Glucose-Capillary Latest Ref Range: 70 - 99 mg/dL 173 (H) 205 (H) 185 (H) 190 (H) 176 (H)   Diabetes history: DM 2 Outpatient Diabetes medications:  Tresiba 18 units q HS Actos 45 mg daily Onglyza 2.5 mg daily Current orders for Inpatient glycemic control:  Novolog moderate tid with meals  Semglee 8 units q HS  Inpatient Diabetes Program Recommendations:    Please consider increasing Semglee to 12 units q HS.   Thanks,  Adah Perl, RN, BC-ADM Inpatient Diabetes Coordinator Pager [phone]  (8a-5p)

## 2020-10-28 NOTE — Progress Notes (Signed)
Pt wanted to hold Ensure drink today to see if that was causing diarrhea. Pt has not had diarrhea since about 1000. Is there another protein drink possible for pt?

## 2020-10-28 NOTE — Progress Notes (Signed)
Physical Therapy Treatment Patient Details Name: James Glover MRN: [id number] DOB: [birth year] Today's Date: 10/28/2020    History of Present Illness The pt is a 75 yo male presenting 8/25 from SNF due to ultrasound and HIDA scan concerning for gangrenous cholecystitis and larger liver abscess. Pt reccently hospitalized 8/17-8/22 due to acute cholecystitis complicated by subcapsular live abscess and infection and d/c to SNF for short term rehab. PMH includes: aortic atherosclerosis, CKD III, HTN, DM II, and prostate cancer.    PT Comments    Pt supine in bed has been having multiple bouts of diarrhea.  He refused OOB and was agreeable to perform supine exercises.  Presents with LE edema and informed MD and RN who placed order for TED Glover.  Plan for OOB mobility next session.  HEP issued with instructions to perform x3 daily.     Follow Up Recommendations  Home health PT;Supervision for mobility/OOB     Equipment Recommendations  Rolling walker with 5" wheels;3in1 (PT) (bariatric and Tall - Pt is 6'4".)    Recommendations for Other Services       Precautions / Restrictions Precautions Precautions: Fall;Other (comment) Precaution Comments: chole tube, x2 drains, watch O2 Restrictions Weight Bearing Restrictions: No    Mobility  Bed Mobility Overal bed mobility: Needs Assistance             General bed mobility comments: Performed bridging to boost into supine this session.  He refused OOB secondary to multiple bouts of diarrhea .    Transfers                    Ambulation/Gait                 Stairs             Wheelchair Mobility    Modified Rankin (Stroke Patients Only)       Balance                                            Cognition Arousal/Alertness: Awake/alert Behavior During Therapy: WFL for tasks assessed/performed Overall Cognitive Status: Within Functional Limits for tasks assessed                                  General Comments: Pt pleasant but refuses OOB due to multiple bouts of diarrhea today.  He is motivated to get OOB tomorrow.      Exercises General Exercises - Lower Extremity Ankle Circles/Pumps: AROM;Both;20 reps;Supine Quad Sets: AROM;Both;10 reps;Supine Heel Slides: AROM;Both;10 reps;Supine Hip ABduction/ADduction: AROM;Both;10 reps;Supine Straight Leg Raises: AAROM;Both;10 reps;Supine    General Comments        Pertinent Vitals/Pain Pain Assessment: 0-10 Pain Score: 5  Pain Location: abdomen Pain Descriptors / Indicators: Grimacing;Discomfort Pain Intervention(s): Monitored during session;Repositioned    Home Living                      Prior Function            PT Goals (current goals can now be found in the care plan section) Acute Rehab PT Goals Patient Stated Goal: return home Potential to Achieve Goals: Good Progress towards PT goals: Progressing toward goals    Frequency    Min 3X/week      PT Plan  Current plan remains appropriate    Co-evaluation              AM-PAC PT "6 Clicks" Mobility   Outcome Measure  Help needed turning from your back to your side while in a flat bed without using bedrails?: A Little Help needed moving from lying on your back to sitting on the side of a flat bed without using bedrails?: A Little Help needed moving to and from a bed to a chair (including a wheelchair)?: A Little Help needed standing up from a chair using your arms (e.g., wheelchair or bedside chair)?: A Little Help needed to walk in hospital room?: A Little Help needed climbing 3-5 steps with a railing? : A Little 6 Click Score: 18    End of Session Equipment Utilized During Treatment: Gait belt Activity Tolerance: Patient tolerated treatment well;Patient limited by fatigue Patient left: with call bell/phone within reach;in chair;with family/visitor present Nurse Communication: Mobility status PT Visit  Diagnosis: Unsteadiness on feet (R26.81);Other abnormalities of gait and mobility (R26.89);Muscle weakness (generalized) (M62.81)     Time: [id number] PT Time Calculation (min) (ACUTE ONLY): 24 min  Charges:  $Therapeutic Exercise: 8-22 mins $Therapeutic Activity: 8-22 mins                     James Glover , PTA Acute Rehabilitation Services Pager [phone] Office [phone]    James Glover James Glover 10/28/2020, 12:45 PM

## 2020-10-29 ENCOUNTER — Inpatient Hospital Stay: Payer: Medicare PPO | Admitting: Internal Medicine

## 2020-10-29 ENCOUNTER — Inpatient Hospital Stay (HOSPITAL_COMMUNITY): Payer: Medicare PPO

## 2020-10-29 LAB — BASIC METABOLIC PANEL
Anion gap: 8 (ref 5–15)
BUN: 19 mg/dL (ref 8–23)
CO2: 25 mmol/L (ref 22–32)
Calcium: 7.9 mg/dL — ABNORMAL LOW (ref 8.9–10.3)
Chloride: 97 mmol/L — ABNORMAL LOW (ref 98–111)
Creatinine, Ser: 1.63 mg/dL — ABNORMAL HIGH (ref 0.61–1.24)
GFR, Estimated: 44 mL/min — ABNORMAL LOW (ref >60)
Glucose, Bld: 167 mg/dL — ABNORMAL HIGH (ref 70–99)
Potassium: 3.8 mmol/L (ref 3.5–5.1)
Sodium: 130 mmol/L — ABNORMAL LOW (ref 135–145)

## 2020-10-29 LAB — HEPATIC FUNCTION PANEL
ALT: 19 U/L (ref 0–44)
AST: 16 U/L (ref 15–41)
Albumin: 1.6 g/dL — ABNORMAL LOW (ref 3.5–5.0)
Alkaline Phosphatase: 54 U/L (ref 38–126)
Bilirubin, Direct: <0.1 mg/dL (ref 0.0–0.2)
Total Bilirubin: 0.6 mg/dL (ref 0.3–1.2)
Total Protein: 5.2 g/dL — ABNORMAL LOW (ref 6.5–8.1)

## 2020-10-29 LAB — CBC
HCT: 28 % — ABNORMAL LOW (ref 39.0–52.0)
Hemoglobin: 9.4 g/dL — ABNORMAL LOW (ref 13.0–17.0)
MCH: 32.1 pg (ref 26.0–34.0)
MCHC: 33.6 g/dL (ref 30.0–36.0)
MCV: 95.6 fL (ref 80.0–100.0)
Platelets: 317 10*3/uL (ref 150–400)
RBC: 2.93 MIL/uL — ABNORMAL LOW (ref 4.22–5.81)
RDW: 13.5 % (ref 11.5–15.5)
WBC: 13 10*3/uL — ABNORMAL HIGH (ref 4.0–10.5)
nRBC: 0 % (ref 0.0–0.2)

## 2020-10-29 LAB — GLUCOSE, CAPILLARY
Glucose-Capillary: 173 mg/dL — ABNORMAL HIGH (ref 70–99)
Glucose-Capillary: 164 mg/dL — ABNORMAL HIGH (ref 70–99)
Glucose-Capillary: 162 mg/dL — ABNORMAL HIGH (ref 70–99)
Glucose-Capillary: 158 mg/dL — ABNORMAL HIGH (ref 70–99)

## 2020-10-29 LAB — PHOSPHORUS: Phosphorus: 3.2 mg/dL (ref 2.5–4.6)

## 2020-10-29 LAB — MAGNESIUM: Magnesium: 1.8 mg/dL (ref 1.7–2.4)

## 2020-10-29 MED ORDER — IOHEXOL 9 MG/ML PO SOLN
ORAL | Status: AC
  Administered 2020-10-29: 500 mL
  Filled 2020-10-29: qty 1000

## 2020-10-29 NOTE — Progress Notes (Signed)
Occupational Therapy Treatment Patient Details Name: James Glover MRN: [id number] DOB: 1945/08/19 Today's Date: 10/29/2020    History of present illness The pt is a 75 yo male presenting 8/25 from SNF due to ultrasound and HIDA scan concerning for gangrenous cholecystitis and larger liver abscess. Pt reccently hospitalized 8/17-8/22 due to acute cholecystitis complicated by subcapsular live abscess and infection and d/c to SNF for short term rehab. PMH includes: aortic atherosclerosis, CKD III, HTN, DM II, and prostate cancer.   OT comments  [name] is progressing well, continues to be limited by diarrhea with any exertion. Upon arrival, pt was soiled in bed and requested to get to the bathroom. Due to urgency pt completed short transfer to [name] with min A overall. He required mod A for pericare and was able to tolerate standing for about 1 minute at a time x4 this session. Pt continues to benefit from OT acutely pt progress function in ADLs. D/c plan remains appropriate.    Follow Up Recommendations  Home health OT;Supervision/Assistance - 24 hour    Equipment Recommendations  None recommended by OT       Precautions / Restrictions Precautions Precautions: Fall;Other (comment) Precaution Comments: chole tube, x2 drains, watch O2       Mobility Bed Mobility Overal bed mobility: Needs Assistance Bed Mobility: Rolling;Sidelying to Sit;Sit to Sidelying Rolling: Min guard Sidelying to sit: Min assist       General bed mobility comments: pt required vc for sequencing bed mobility for log rolling and side>sit. He reported pain with sidelying and required min A to elevate trunk into sitting    Transfers Overall transfer level: Needs assistance Equipment used: Rolling walker (2 wheeled) Transfers: Sit to/from [facility] Sit to Stand: Min assist Stand pivot transfers: Min guard       General transfer comment: min A to boost into standing, min guard for short  stand pivot. Pt limited to stand pivots this sesion due to urgency    Balance Overall balance assessment: Needs assistance Sitting-balance support: No upper extremity supported Sitting balance-[name] Scale: Good     Standing balance support: During functional activity;Single extremity supported Standing balance-[name] Scale: Fair                             ADL either performed or assessed with clinical judgement   ADL Overall ADL's : Needs assistance/impaired                         Toilet Transfer: Minimal assistance;Stand-pivot;RW;BSC Toilet Transfer Details (indicate cue type and reason): stand pivot to [facility] today due to urgency Toileting- Clothing Manipulation and Hygiene: Moderate assistance Toileting - Clothing Manipulation Details (indicate cue type and reason): pt required mod A for pericare in standing for thoroughness     Functional mobility during ADLs: Minimal assistance;Rolling walker;Cueing for safety General ADL Comments: pt limited to standing and stand pivots only this session due to BMs with exertion. He completed peri care in sitting/standing but required mod A for thoroughness      Cognition Arousal/Alertness: Awake/alert Behavior During Therapy: WFL for tasks assessed/performed Overall Cognitive Status: Within Functional Limits for tasks assessed               General Comments: pt pleasant, apologetic for BM and denying further mobility due to fear of making a mess  General Comments VSS on RA. Pt stated he felt as if his O2 was getting low, it was at 94%, he applied nasal canula and it elevated to 98%. Pt with multiple soft stools.    Pertinent Vitals/ Pain       Pain Assessment: Faces Faces Pain Scale: Hurts a little bit Pain Location: abdomen Pain Descriptors / Indicators: Grimacing;Discomfort Pain Intervention(s): Limited activity within patient's tolerance;Monitored during session   Frequency  Min 2X/week         Progress Toward Goals  OT Goals(current goals can now be found in the care plan section)     Acute Rehab OT Goals Patient Stated Goal: return home OT Goal Formulation: With patient Time For Goal Achievement: 11/09/20 Potential to Achieve Goals: Fair ADL Goals Pt Will Perform Grooming: with supervision;standing Pt Will Perform Lower Body Bathing: with set-up;sit to/from stand Pt Will Perform Lower Body Dressing: with set-up;sit to/from stand Pt Will Transfer to Toilet: with modified independence;ambulating Pt Will Perform Toileting - Clothing Manipulation and hygiene: with modified independence Additional ADL Goal #1: Pt will tolerate at least 5 minutes of OOB functional activity to prepare for safe discharge home  Plan Discharge plan remains appropriate       AM-PAC OT "6 Clicks" Daily Activity     Outcome Measure   Help from another person eating meals?: None Help from another person taking care of personal grooming?: A Little Help from another person toileting, which includes using toliet, bedpan, or urinal?: A Little Help from another person bathing (including washing, rinsing, drying)?: A Lot Help from another person to put on and taking off regular upper body clothing?: None Help from another person to put on and taking off regular lower body clothing?: A Lot 6 Click Score: 18    End of Session Equipment Utilized During Treatment: Rolling walker;Oxygen (BSC)  OT Visit Diagnosis: Unsteadiness on feet (R26.81);Muscle weakness (generalized) (M62.81);Pain   Activity Tolerance Patient tolerated treatment well;Other (comment) (limited by soft stools with exertion)   Patient Left in chair;with call bell/phone within reach;with family/visitor present   Nurse Communication Mobility status;Precautions;Weight bearing status        Time: [id number] OT Time Calculation (min): 33 min  Charges: OT General Charges $OT Visit: 1 Visit OT Treatments $Self Care/Home  Management : 23-37 mins   Haely Leyland A Sem Mccaughey 10/29/2020, 9:20 AM

## 2020-10-29 NOTE — Progress Notes (Signed)
PROGRESS NOTE   James Glover  [id number] DOB: 08-06-1945 DOA: 10/23/2020 PCP: [name], MD  Brief Narrative:  75 year old white male CKD stage III, prostate CA in remission status post brachytherapy 2020 DM TY 2 HTN HLD reflux Recent hospitalization 8/17--8/22 with acute cholecystitis Rx Unasyn-->Augmentin to complete 9/16 Blood cultures at that time grew Streptococcus [place] lytic [place] and patient was converted to oral Augmentin as repeat cultures 8/19  neg-- patient transferred to skilled facility  He developed abdominal RUQ pain after diet resumed-he went to follow-up with general surgery office and was found to have enlarged liver abscess on CT with complex intraparenchymal pericholecystic abscesses  General surgery recommended drainage  8/26 perc cholecystotomy tubes placed in for a hepatic abscess, superior perihepatic abscess 8/31 repeat CT abdomen planned   Hospital-Problem based course  Sepsis on admission secondary to acalculous cholecystitis with liver abscess complicated by severe sepsis with endorgan damage on admission Hepatic drains x 2 + cholecystotomy tube await repeat CT 8/31 Defer to IR for further management and will probably need drain follow-up in the outpatient setting White count remains stable in the 13 range--continues on Zosyn at this time until imaging is performed to determine next steps Eventual outpatient cholecystectomy per Dr. Constance Haw to be performed Pain control tramadol 50 every 12 as needed moderate pain-discontinue IV Dilaudid Continue soft diet Diarrhea not otherwise specified Has had this for 2 to 3 days but may be coinciding with soft diet resumption/liquids more than solids Is on Zosyn If has more than 3 loose stools over 24-hour period of time with test for C. difficile otherwise would hold--- is not on any laxatives at this time AKI superimposed CKD 3 AA with hyponatremia hypokalemia IV fluids initiated but held Now resolved electrolyte  abnormalities--creatinine seems to be close to his baseline DM TY 2 with dyslipidemia CBGs ranging 1 60-1 70 Continue 12 units of long-acting insulin with moderate sliding scale HTN uncontrolled Continue amlodipine 5, hydralazine 25 every 6 as needed, HCTZ 25 Stage II left buttock pressure ulcer from prior to admission Encourage mobility Prostate cancer status post brachytherapy 2020 Continue outpatient evaluation management and continue Ditropan 5 twice daily, Flomax 0.4 twice daily  DVT prophylaxis: Lovenox Code Status: Full Family Communication: Discussed with wife at the bedside Disposition:  Status is: Inpatient  Remains inpatient appropriate because:Persistent severe electrolyte disturbances, IV treatments appropriate due to intensity of illness or inability to take PO, and Inpatient level of care appropriate due to severity of illness  Dispo: The patient is from: Home              Anticipated d/c is to:  Home with home health              Patient currently is not medically stable to d/c.   Difficult to place patient No       Consultants:  General surgery Interventional radiology  Procedures:   8/26 10 French cholecystotomy 8/26 infrahepatic collection 10 French drain + suction bulb 8/26 severe perihepatic 10 French drain with suction bulb 8/26 intrahepatic collections noted GB fossa drained with 18 G needle  Antimicrobials: Currently Zosyn   Subjective: Awake coherent no distress Some diarrhea but seems to be more pudding-like in consistency now No chest pain no fever no chills Feels overall quite weak but is eating more than he has been No shortness of breath   Objective: Vitals:   10/28/20 1729 10/28/20 2051 10/29/20 0404 10/29/20 0738  BP: (!) 163/60 (!) 161/58 Marland Kitchen)  159/58 (!) 172/61  Pulse: 83 81 77 82  Resp: '18 18 18 18  '$ Temp: 99 F (37.2 C) 98 F (36.7 C) 98.2 F (36.8 C) 98.1 F (36.7 C)  TempSrc: Oral Oral Oral Oral  SpO2: 94% 95% 93% 90%   Weight:        Intake/Output Summary (Last 24 hours) at 10/29/2020 1002 Last data filed at 10/29/2020 0646 Gross per 24 hour  Intake 10 ml  Output 446 ml  Net -436 ml   Filed Weights   10/27/20 1335  Weight: 133.8 kg    Examination:  EOMI NCAT no focal deficits sitting up in chair seems comfortable CTA B no added sound no rales no rhonchi S1-S2 no murmur no rub no gallop RRR Abdomen soft no rebound no guarding 3 drains in place--- minimally distended, cannot appreciate HSM No lower extremity edema  Data Reviewed: personally reviewed   CBC    Component Value Date/Time   WBC 13.0 (H) 10/29/2020 0040   RBC 2.93 (L) 10/29/2020 0040   HGB 9.4 (L) 10/29/2020 0040   HGB 12.3 (L) 08/19/2020 0940   HCT 28.0 (L) 10/29/2020 0040   HCT 36.5 (L) 08/19/2020 0940   PLT 317 10/29/2020 0040   PLT 151 08/19/2020 0940   MCV 95.6 10/29/2020 0040   MCV 97 08/19/2020 0940   MCH 32.1 10/29/2020 0040   MCHC 33.6 10/29/2020 0040   RDW 13.5 10/29/2020 0040   RDW 12.7 08/19/2020 0940   LYMPHSABS 0.6 (L) 10/26/2020 0758   LYMPHSABS 0.5 (L) 08/19/2020 0940   MONOABS 1.1 (H) 10/26/2020 0758   EOSABS 0.2 10/26/2020 0758   EOSABS 0.2 08/19/2020 0940   BASOSABS 0.1 10/26/2020 0758   BASOSABS 0.0 08/19/2020 0940   CMP Latest Ref Rng & Units 10/29/2020 10/28/2020 10/27/2020  Glucose 70 - 99 mg/dL 167(H) 191(H) 176(H)  BUN 8 - 23 mg/dL 19 21 24(H)  Creatinine 0.61 - 1.24 mg/dL 1.63(H) 1.68(H) 1.81(H)  Sodium 135 - 145 mmol/L 130(L) 130(L) 132(L)  Potassium 3.5 - 5.1 mmol/L 3.8 4.0 3.6  Chloride 98 - 111 mmol/L 97(L) 97(L) 98  CO2 22 - 32 mmol/L '25 24 25  '$ Calcium 8.9 - 10.3 mg/dL 7.9(L) 7.8(L) 7.8(L)  Total Protein 6.5 - 8.1 g/dL - - -  Total Bilirubin 0.3 - 1.2 mg/dL - - -  Alkaline Phos 38 - 126 U/L - - -  AST 15 - 41 U/L - - -  ALT 0 - 44 U/L - - -     Radiology Studies: No results found.   Scheduled Meds:  amLODipine  5 mg Oral Daily   vitamin C  250 mg Oral BID   aspirin EC  81  mg Oral Daily   enoxaparin (LOVENOX) injection  40 mg Subcutaneous Q24H   famotidine  40 mg Oral QHS   hydrochlorothiazide  25 mg Oral Daily   insulin aspart  0-15 Units Subcutaneous TID WC   insulin glargine-yfgn  12 Units Subcutaneous QHS   loperamide  2 mg Oral Once   multivitamin with minerals  1 tablet Oral Daily   oxybutynin  5 mg Oral BID   pantoprazole  40 mg Oral Daily   Ensure Max Protein  11 oz Oral BID   saccharomyces boulardii  250 mg Oral BID   simvastatin  20 mg Oral QHS   sodium chloride flush  5 mL Intracatheter Q8H   tamsulosin  0.4 mg Oral BID PC   Continuous Infusions:  piperacillin-tazobactam (ZOSYN)  IV 3.375 g (10/29/20 0604)     LOS: 6 days   Time spent: 73  Nita Sells, MD [facility] To contact the attending provider between 7A-7P or the covering provider during after hours 7P-7A, please log into the web site [email or web address] and access using universal [place] password for that web site. If you do not have the password, please call the hospital operator.  10/29/2020, 10:02 AM

## 2020-10-29 NOTE — TOC Progression Note (Addendum)
Transition of Care [facility]) - Progression Note    Patient Details  Name: [name] MRN: [id number] Date of Birth: September 26, 1945  [facility] of [facility]) CM/SW Contact  Jacalyn Lefevre Edson Snowball, RN Phone Number: 10/29/2020, 11:32 AM  Clinical Narrative:     Spoke to patient and wife at bedside. Both in agreement for home health services. They are aware home health will not be making daily visits. Wife asking to begin teaching on his drains etc. NCM will secure chat nurse.    Left message with [facility] with [facility]. Patient does want Rolling walker with 5" wheels;3in1 (PT) (bariatric and tall (pt 6'3")) ordered both with [facility].  Sombrillo with [facility] called stated patient does not qualify for bari DME because of his weight , insurance will not cover, they can provide standard DME or patient can pay out of pocket for DME. Chrys Racer spoke with patient's wife and discussed cost, wife is going to look to see if she can find cheaper prices.  1615 PT updated note to standard walker and tub bench . Same ordered with [employer]  Expected Discharge Plan: [place]    Expected Discharge Plan and Services Expected Discharge Plan: [place] Choice: Home Health, Durable Medical Equipment Living arrangements for the past 2 months: Single Family Home                                       Social Determinants of Health (SDOH) Interventions    Readmission Risk Interventions Readmission Risk Prevention Plan 10/16/2020  Transportation Screening Complete  HRI or [place] Complete  Social Work Consult for [place] Planning/Counseling Complete  Palliative Care Screening Not Applicable  Medication Review [employer]) Complete  Some recent data might be hidden

## 2020-10-29 NOTE — Care Management Important Message (Signed)
Important Message  Patient Details  Name: James Glover MRN: [id number] Date of Birth: Jan 26, 1946   Medicare Important Message Given:  Yes     Orbie Pyo 10/29/2020, 3:29 PM

## 2020-10-29 NOTE — Progress Notes (Signed)
Physical Therapy Treatment Patient Details Name: James Glover MRN: [id number] DOB: March 13, 1945 Today's Date: 10/29/2020    History of Present Illness The pt is a 75 yo male presenting 8/25 from SNF due to ultrasound and HIDA scan concerning for gangrenous cholecystitis and larger liver abscess. Pt reccently hospitalized 8/17-8/22 due to acute cholecystitis complicated by subcapsular live abscess and infection and d/c to SNF for short term rehab. PMH includes: aortic atherosclerosis, CKD III, HTN, DM II, and prostate cancer.    PT Comments    Pt continues to have bouts of diarrhea, resulting in him being limited in standing mobility this date. Focused session rather on reducing UE edema through exercises and performing lower extremity exercises to reduce risk of muscle atrophy. Pt fatigues easily with seated exercises. Educated pt on performing them as HEP and walking with nursing when able. Will plan to advance OOB mobility and practice stairs in future sessions. Will continue to follow acutely. Recommending pt d/c home with tub bench and RW. The standard RW in room states it can be used for up to 300 lbs with pt weighing ~294 lbs per chart as of 10/27/20. Current recommendations remain appropriate.    Follow Up Recommendations  Home health PT;Supervision for mobility/OOB     Equipment Recommendations  Rolling walker with 5" wheels;Other (comment) (tub bench; standard RW for 6'4" height)    Recommendations for Other Services       Precautions / Restrictions Precautions Precautions: Fall;Other (comment) Precaution Comments: chole tube, x2 drains, watch O2 Restrictions Weight Bearing Restrictions: No    Mobility  Bed Mobility                    Transfers                 General transfer comment: Began to attempt to stand but ultimately declined due to feeling like he was going to have diarrhea again if he did.  Ambulation/Gait             General Gait  Details: Declined today   Stairs             Wheelchair Mobility    Modified Rankin (Stroke Patients Only)       Balance                                            Cognition Arousal/Alertness: Awake/alert Behavior During Therapy: WFL for tasks assessed/performed Overall Cognitive Status: Within Functional Limits for tasks assessed                                 General Comments: Pt pleasant but refuses OOB due to multiple bouts of diarrhea today and concern for further diarrhea if he were to stand, even with suggestion from PT to make a brief pad around him.      Exercises General Exercises - Upper Extremity Shoulder Flexion: AROM;Both;10 reps;Seated Digit Composite Flexion: AROM;Both;Other reps (comment);Seated (x12 reps) Composite Extension: AROM;Both;10 reps;Seated (x12 reps) General Exercises - Lower Extremity Long Arc Quad: AROM;Both;Seated;10 reps Hip ABduction/ADduction: AROM;Both;Other reps (comment);Seated (x13 reps) Hip Flexion/Marching: AROM;Both;Other reps (comment);Seated (x12 reps) Toe Raises: AROM;Both;10 reps;Seated Heel Raises: AROM;Both;10 reps;Seated    General Comments General comments (skin integrity, edema, etc.): Educated pt on benefits of regular mobility, sitting up, and exercising  to reduce risk of deconditioning and muscle atrophy while in hospital      Pertinent Vitals/Pain Pain Assessment: Faces Faces Pain Scale: Hurts little more Pain Location: abdomen Pain Descriptors / Indicators: Grimacing;Discomfort Pain Intervention(s): Limited activity within patient's tolerance;Monitored during session;Repositioned    Home Living                      Prior Function            PT Goals (current goals can now be found in the care plan section) Acute Rehab PT Goals Patient Stated Goal: to get better PT Goal Formulation: With patient/family Time For Goal Achievement: 11/07/20 Potential to  Achieve Goals: Good Progress towards PT goals: Progressing toward goals (limited by diarrhea bouts)    Frequency    Min 3X/week      PT Plan Equipment recommendations need to be updated    Co-evaluation              AM-PAC PT "6 Clicks" Mobility   Outcome Measure  Help needed turning from your back to your side while in a flat bed without using bedrails?: A Little Help needed moving from lying on your back to sitting on the side of a flat bed without using bedrails?: A Little Help needed moving to and from a bed to a chair (including a wheelchair)?: A Little Help needed standing up from a chair using your arms (e.g., wheelchair or bedside chair)?: A Little Help needed to walk in hospital room?: A Little Help needed climbing 3-5 steps with a railing? : A Little 6 Click Score: 18    End of Session   Activity Tolerance: Other (comment) (limited by bouts of diarrhea) Patient left: in chair;with call bell/phone within reach;with family/visitor present   PT Visit Diagnosis: Unsteadiness on feet (R26.81);Other abnormalities of gait and mobility (R26.89);Muscle weakness (generalized) (M62.81)     Time: [id number] PT Time Calculation (min) (ACUTE ONLY): 22 min  Charges:  $Therapeutic Exercise: 8-22 mins                     Moishe Spice, PT, DPT Acute Rehabilitation Services  Pager: [phone] Office: [place] 10/29/2020, 3:41 PM

## 2020-10-29 NOTE — Progress Notes (Signed)
Referring Physician(s): Wynetta Fines  Supervising Physician: Ruthann Cancer  Patient Status:  [facility] - In-pt  Chief Complaint: Follow up percutaneous cholecystostomy and hepatic abscess drains x 2 placed 8/26 in IR  Subjective:  Patient sitting up in chair drinking contrast, wife at bedside. Patient denies complaints right now, he is hoping some of the drains can come out soon. He does not like that he has to drink 2 bottles of contrast. His wife states that the drains are usually emptied everyday by the floor staff but today they have not been emptied. She is wondering what they will need to do with the drains once they return home.  Allergies: Ace inhibitors and Statins  Medications: Prior to Admission medications   Medication Sig Start Date End Date Taking? Authorizing Provider  traMADol (ULTRAM) 50 MG tablet Take 50 mg by mouth in the morning and at bedtime. 10/15/20  Yes [provider]  ACCU-CHEK GUIDE test strip USE AS DIRECTED 07/17/20   [name], MD  Accu-Chek Softclix Lancets lancets 1 each by Other route 2 (two) times daily. Use as instructed 03/29/19   [name], MD  amoxicillin-clavulanate (AUGMENTIN) 875-125 MG tablet Take 1 tablet by mouth every 12 (twelve) hours for 25 days. 10/20/20 11/14/20  Patrecia Pour, MD  aspirin 81 MG tablet Take 81 mg by mouth daily.    [provider]  famotidine (PEPCID) 40 MG tablet Take 40 mg by mouth at bedtime.    [provider]  fluorouracil (EFUDEX) 5 % cream  07/11/19   [provider]  hydrochlorothiazide (HYDRODIURIL) 25 MG tablet Take 1 tablet (25 mg total) by mouth daily. TAKE 1 TABLET(25 MG) BY MOUTH DAILY Patient taking differently: Take 25 mg by mouth daily. 08/21/20   [name], MD  insulin degludec (TRESIBA FLEXTOUCH) 100 UNIT/ML FlexTouch Pen ADMINISTER 18 UNITS UNDER THE SKIN AT BEDTIME Patient taking differently: Inject 18 Units into the skin at bedtime. 08/21/20   [name], MD  Insulin Pen  Needle (BD PEN NEEDLE NANO U/F) 32G X 4 MM MISC 1 each by Does not apply route as needed. 08/27/20   [name], MD  losartan (COZAAR) 50 MG tablet TAKE 1 TABLET BY MOUTH DAILY Patient taking differently: Take 50 mg by mouth daily. 08/21/20   [name], MD  Multiple Vitamins-Minerals (MULTIVITAMIN WITH MINERALS) tablet Take 1 tablet by mouth daily.    [provider]  Omega-3 Fatty Acids (FISH OIL) 1000 MG CAPS Take 1,000 mg by mouth 2 (two) times daily.    [provider]  omeprazole (PRILOSEC) 40 MG capsule TAKE 1 CAPSULE(40 MG) BY MOUTH IN THE MORNING AND AT BEDTIME Patient taking differently: Take 40 mg by mouth in the morning and at bedtime. 09/05/20   [name], MD  oxybutynin (DITROPAN) 5 MG tablet Take 5 mg by mouth 2 (two) times daily.  07/30/19   [provider]  pioglitazone (ACTOS) 45 MG tablet Take 1 tablet (45 mg total) by mouth daily. 08/21/20   [name], MD  saccharomyces boulardii (FLORASTOR) 250 MG capsule Take 1 capsule (250 mg total) by mouth 2 (two) times daily for 25 days. 10/20/20 11/14/20  Patrecia Pour, MD  saxagliptin HCl (ONGLYZA) 2.5 MG TABS tablet TAKE 1 TABLET(2.5 MG) BY MOUTH DAILY Patient taking differently: Take 2.5 mg by mouth daily. 08/21/20   [name], MD  simvastatin (ZOCOR) 20 MG tablet TAKE 1 TABLET(20 MG) BY MOUTH AT BEDTIME Patient taking differently: Take  20 mg by mouth at bedtime. 08/21/20   [name], MD  tamsulosin (FLOMAX) 0.4 MG CAPS capsule Take 1 capsule (0.4 mg total) by mouth 2 (two) times daily after a meal. For urinary urgency or weak stream. 02/08/19   Bruning, Ashlyn, PA-C  valACYclovir (VALTREX) 1000 MG tablet Take 2,000 mg by mouth as needed (fever blister). 10/08/14   [provider]     Vital Signs: BP (!) 172/61 (BP Location: Left Arm)   Pulse 82   Temp 98.1 F (36.7 C) (Oral)   Resp 18   Wt 295 lb (133.8 kg)   SpO2 90%   BMI 36.87 kg/m   Physical Exam Vitals and nursing note reviewed.   Constitutional:      General: He is not in acute distress. HENT:     Head: Normocephalic.  Cardiovascular:     Rate and Rhythm: Normal rate.  Pulmonary:     Effort: Pulmonary effort is normal.  Abdominal:     General: There is no distension.     Palpations: Abdomen is soft.     Tenderness: There is no abdominal tenderness.     Comments: (+) RUQ percutaneous cholecystostomy to gravity with ~20 cc clear bilious output, flushes easily. Insertion site unremarkable (+) Right lateral drain labeled #2 to suction bulb with scant serous output. Flushes with resistance. Insertion site unremarkable. (+) Right lateral drain labeled #2 to suction bulb with scant serous output. Flushes with resistance. Insertion site unremarkable.    Skin:    General: Skin is warm and dry.     Coloration: Skin is not jaundiced.  Neurological:     Mental Status: He is alert. Mental status is at baseline.    Imaging: No results found.  Labs:  CBC: Recent Labs    10/26/20 0758 10/27/20 0809 10/28/20 0733 10/28/20 2040 10/29/20 0040  WBC 16.4* 14.1* 13.9*  --  13.0*  HGB 9.5* 9.1* 9.6* 9.5* 9.4*  HCT 28.5* 28.1* 28.6* 28.6* 28.0*  PLT 333 325 333  --  317    COAGS: Recent Labs    10/15/20 2044  INR 1.4*  APTT 38*    BMP: Recent Labs    10/26/20 0758 10/27/20 0809 10/28/20 0733 10/29/20 0040  NA 134* 132* 130* 130*  K 3.9 3.6 4.0 3.8  CL 101 98 97* 97*  CO2 '23 25 24 25  '$ GLUCOSE 176* 176* 191* 167*  BUN 28* 24* 21 19  CALCIUM 8.0* 7.8* 7.8* 7.9*  CREATININE 1.91* 1.81* 1.68* 1.63*  GFRNONAA 36* 39* 42* 44*    LIVER FUNCTION TESTS: Recent Labs    10/19/20 0554 10/23/20 1122 10/24/20 0131 10/29/20 0040  BILITOT 1.0 0.7 0.7 0.6  AST 36 31 34 16  ALT 52* 38 41 19  ALKPHOS 74 74 69 54  PROT 5.7* 5.4* 5.0* 5.2*  ALBUMIN 2.1* 1.9* 1.8* 1.6*    Assessment and Plan:  75 y/o M admitted for gangrenous cholecystitis and liver abscess s/p percutaneous cholecystostomy and hepatic  abscess drain placement x 2 in IR on 10/24/20 seen today for drain follow up.  Drain Location: RUQ Size: Fr size: 10 Fr Date of placement: 10/24/20  Currently to: Drain collection device: gravity  Drain Location: RUQ (labeled #2) Size: Fr size: 10 Fr Date of placement: 10/24/20  Currently to: Drain collection device: suction bulb  Drain Location: RUQ (labeled #3) Size: Fr size: 10 Fr Date of placement: 10/24/20  Currently to: Drain collection device: suction bulb  24 hour  output:  Output by Drain (mL) 10/27/20 0701 - 10/27/20 1900 10/27/20 1901 - 10/28/20 0700 10/28/20 0701 - 10/28/20 1900 10/28/20 1901 - 10/29/20 0700 10/29/20 0701 - 10/29/20 1523  Closed System Drain 2 Lateral;Right Abdomen Bulb (JP) 10.2 Fr. '10 20 1 23   '$ Closed System Drain 3 Right RUQ Bulb (JP) 10.2 Fr. '10 20 1 20   '$ Biliary Tube Other (Comment) 10.2 Fr. RUQ 310 20 1 0     Interval imaging/drain manipulation:  None - CT abd/pelvis w/ PO and IV contrast pending  Current examination: Percutaneous cholecystostomy flushes easily. Clear bilious output. Insertion site unremarkable. Suture and stat lock in place. Dressed appropriately.   Both hepatic abscess drains (labeled #2 and #3) flush with resistance. Scant serous output in both bulbs. Insertion sites are unremarkable Sutures and stat locks in place Both are dressed appropriately  Plan:  Continue TID flushes with 5 cc NS. Record output Q shift. Dressing changes QD or PRN if soiled.  Call IR APP or on call IR MD if difficulty flushing or sudden change in drain output.  Will follow up on CT abd/pelvis w/contrast tomorrow to assess need for drain injection prior to possible removal of abscess drains.  Discharge planning: Please contact IR APP or on call IR MD prior to patient d/c to ensure appropriate follow up plans are in place. Typically patient will follow up with IR clinic 10-14 days post d/c for repeat imaging/possible drain injection of abscess drains.  Percutaneous cholecystostomy likely to remain in place until cholecystectomy. IR scheduler will contact patient with date/time of appointment. Patient will need to flush abscess drains QD with 5 cc NS, record output QD from each drain, dressing changes every 2-3 days or earlier if soiled.   IR will continue to follow - please call with questions or concerns.  Electronically Signed: Joaquim Nam, PA-C 10/29/2020, 3:18 PM   I spent a total of 15 Minutes at the the patient's bedside AND on the patient's hospital floor or unit, greater than 50% of which was counseling/coordinating care for percutaneous cholecystostomy and hepatic abscess drain x 2 follow up.

## 2020-10-30 ENCOUNTER — Other Ambulatory Visit: Payer: Self-pay

## 2020-10-30 LAB — COMPREHENSIVE METABOLIC PANEL
ALT: 20 U/L (ref 0–44)
AST: 17 U/L (ref 15–41)
Albumin: 1.6 g/dL — ABNORMAL LOW (ref 3.5–5.0)
Alkaline Phosphatase: 58 U/L (ref 38–126)
Anion gap: 11 (ref 5–15)
BUN: 19 mg/dL (ref 8–23)
CO2: 24 mmol/L (ref 22–32)
Calcium: 8 mg/dL — ABNORMAL LOW (ref 8.9–10.3)
Chloride: 94 mmol/L — ABNORMAL LOW (ref 98–111)
Creatinine, Ser: 1.54 mg/dL — ABNORMAL HIGH (ref 0.61–1.24)
GFR, Estimated: 47 mL/min — ABNORMAL LOW (ref >60)
Glucose, Bld: 133 mg/dL — ABNORMAL HIGH (ref 70–99)
Potassium: 3.8 mmol/L (ref 3.5–5.1)
Sodium: 129 mmol/L — ABNORMAL LOW (ref 135–145)
Total Bilirubin: 0.8 mg/dL (ref 0.3–1.2)
Total Protein: 5.2 g/dL — ABNORMAL LOW (ref 6.5–8.1)

## 2020-10-30 LAB — AEROBIC/ANAEROBIC CULTURE W GRAM STAIN (SURGICAL/DEEP WOUND)
Culture: NO GROWTH
Culture: NO GROWTH
Culture: NO GROWTH
Gram Stain: NONE SEEN

## 2020-10-30 LAB — CBC
HCT: 29.6 % — ABNORMAL LOW (ref 39.0–52.0)
Hemoglobin: 9.8 g/dL — ABNORMAL LOW (ref 13.0–17.0)
MCH: 31.5 pg (ref 26.0–34.0)
MCHC: 33.1 g/dL (ref 30.0–36.0)
MCV: 95.2 fL (ref 80.0–100.0)
Platelets: 312 10*3/uL (ref 150–400)
RBC: 3.11 MIL/uL — ABNORMAL LOW (ref 4.22–5.81)
RDW: 13.5 % (ref 11.5–15.5)
WBC: 13 10*3/uL — ABNORMAL HIGH (ref 4.0–10.5)
nRBC: 0 % (ref 0.0–0.2)

## 2020-10-30 LAB — GLUCOSE, CAPILLARY
Glucose-Capillary: 142 mg/dL — ABNORMAL HIGH (ref 70–99)
Glucose-Capillary: 164 mg/dL — ABNORMAL HIGH (ref 70–99)
Glucose-Capillary: 172 mg/dL — ABNORMAL HIGH (ref 70–99)
Glucose-Capillary: 175 mg/dL — ABNORMAL HIGH (ref 70–99)
Glucose-Capillary: 197 mg/dL — ABNORMAL HIGH (ref 70–99)

## 2020-10-30 MED ORDER — ENOXAPARIN SODIUM 60 MG/0.6ML IJ SOSY
60.0000 mg | PREFILLED_SYRINGE | Freq: Every day | INTRAMUSCULAR | Status: DC
Start: 2020-10-30 — End: 2020-10-31
  Administered 2020-10-30 – 2020-10-31 (×2): 60 mg via SUBCUTANEOUS
  Filled 2020-10-30 (×2): qty 0.6

## 2020-10-30 MED ORDER — LOPERAMIDE HCL 2 MG PO CAPS
2.0000 mg | ORAL_CAPSULE | Freq: Two times a day (BID) | ORAL | Status: DC | PRN
  Administered 2020-10-31 – 2020-11-03 (×6): 2 mg via ORAL
  Filled 2020-10-30 (×6): qty 1

## 2020-10-30 MED ORDER — IOHEXOL 350 MG/ML SOLN
75.0000 mL | Freq: Once | INTRAVENOUS | Status: AC | PRN
  Administered 2020-10-30: 75 mL via INTRAVENOUS

## 2020-10-30 MED ORDER — FUROSEMIDE 40 MG PO TABS
40.0000 mg | ORAL_TABLET | Freq: Every day | ORAL | Status: DC
  Administered 2020-10-30: 40 mg via ORAL
  Filled 2020-10-30: qty 1

## 2020-10-30 MED ORDER — AMOXICILLIN-POT CLAVULANATE 875-125 MG PO TABS
1.0000 | ORAL_TABLET | Freq: Two times a day (BID) | ORAL | Status: DC
  Administered 2020-10-30 – 2020-11-01 (×4): 1 via ORAL
  Filled 2020-10-30 (×4): qty 1

## 2020-10-30 NOTE — Progress Notes (Signed)
Referring Physician(s): Wynetta Fines  Supervising Physician: Daryll Brod  Patient Status:  [facility] - In-pt  Chief Complaint: Follow up percutaneous cholecystostomy and hepatic abscess drains x 2 placed 8/26 in IR  Subjective: Patient sitting up in the chair, his wife is also in the room. He denies any significant pain or discomfort.   Allergies: Ace inhibitors and Statins  Medications: Prior to Admission medications   Medication Sig Start Date End Date Taking? Authorizing Provider  traMADol (ULTRAM) 50 MG tablet Take 50 mg by mouth in the morning and at bedtime. 10/15/20  Yes [provider]  ACCU-CHEK GUIDE test strip USE AS DIRECTED 07/17/20   [name], MD  Accu-Chek Softclix Lancets lancets 1 each by Other route 2 (two) times daily. Use as instructed 03/29/19   [name], MD  amoxicillin-clavulanate (AUGMENTIN) 875-125 MG tablet Take 1 tablet by mouth every 12 (twelve) hours for 25 days. 10/20/20 11/14/20  Patrecia Pour, MD  aspirin 81 MG tablet Take 81 mg by mouth daily.    [provider]  famotidine (PEPCID) 40 MG tablet Take 40 mg by mouth at bedtime.    [provider]  fluorouracil (EFUDEX) 5 % cream  07/11/19   [provider]  hydrochlorothiazide (HYDRODIURIL) 25 MG tablet Take 1 tablet (25 mg total) by mouth daily. TAKE 1 TABLET(25 MG) BY MOUTH DAILY Patient taking differently: Take 25 mg by mouth daily. 08/21/20   [name], MD  insulin degludec (TRESIBA FLEXTOUCH) 100 UNIT/ML FlexTouch Pen ADMINISTER 18 UNITS UNDER THE SKIN AT BEDTIME Patient taking differently: Inject 18 Units into the skin at bedtime. 08/21/20   [name], MD  Insulin Pen Needle (BD PEN NEEDLE NANO U/F) 32G X 4 MM MISC 1 each by Does not apply route as needed. 08/27/20   [name], MD  losartan (COZAAR) 50 MG tablet TAKE 1 TABLET BY MOUTH DAILY Patient taking differently: Take 50 mg by mouth daily. 08/21/20   [name], MD  Multiple Vitamins-Minerals (MULTIVITAMIN WITH  MINERALS) tablet Take 1 tablet by mouth daily.    [provider]  Omega-3 Fatty Acids (FISH OIL) 1000 MG CAPS Take 1,000 mg by mouth 2 (two) times daily.    [provider]  omeprazole (PRILOSEC) 40 MG capsule TAKE 1 CAPSULE(40 MG) BY MOUTH IN THE MORNING AND AT BEDTIME Patient taking differently: Take 40 mg by mouth in the morning and at bedtime. 09/05/20   [name], MD  oxybutynin (DITROPAN) 5 MG tablet Take 5 mg by mouth 2 (two) times daily.  07/30/19   [provider]  pioglitazone (ACTOS) 45 MG tablet Take 1 tablet (45 mg total) by mouth daily. 08/21/20   [name], MD  saccharomyces boulardii (FLORASTOR) 250 MG capsule Take 1 capsule (250 mg total) by mouth 2 (two) times daily for 25 days. 10/20/20 11/14/20  Patrecia Pour, MD  saxagliptin HCl (ONGLYZA) 2.5 MG TABS tablet TAKE 1 TABLET(2.5 MG) BY MOUTH DAILY Patient taking differently: Take 2.5 mg by mouth daily. 08/21/20   [name], MD  simvastatin (ZOCOR) 20 MG tablet TAKE 1 TABLET(20 MG) BY MOUTH AT BEDTIME Patient taking differently: Take 20 mg by mouth at bedtime. 08/21/20   [name], MD  tamsulosin (FLOMAX) 0.4 MG CAPS capsule Take 1 capsule (0.4 mg total) by mouth 2 (two) times daily after a meal. For urinary urgency or weak stream. 02/08/19   Bruning, Ashlyn, PA-C  valACYclovir (VALTREX) 1000 MG tablet Take 2,000 mg by  mouth as needed (fever blister). 10/08/14   [provider]     Vital Signs: BP (!) 154/63 (BP Location: Left Arm)   Pulse 87   Temp 98.3 F (36.8 C) (Oral)   Resp 16   Ht '6\' 3"'$  (1.905 m)   Wt 295 lb 13.7 oz (134.2 kg)   SpO2 92%   BMI 36.98 kg/m   Physical Exam Constitutional:      General: He is not in acute distress. Pulmonary:     Effort: Pulmonary effort is normal.  Abdominal:     Tenderness: There is no abdominal tenderness.     Comments: RUQ drains x 3 Percutaneous cholecystostomy to gravity with approximately 30 ml of bile. Abscess drains x 2 to JP bulb - both  with serous fluid.   Skin:    General: Skin is warm and dry.  Neurological:     Mental Status: He is alert and oriented to person, place, and time.    Imaging: CT ABDOMEN PELVIS W CONTRAST  Result Date: 10/30/2020 CLINICAL DATA:  Intra-abdominal abscesses, follow-up assessment EXAM: CT ABDOMEN AND PELVIS WITH CONTRAST TECHNIQUE: Multidetector CT imaging of the abdomen and pelvis was performed using the standard protocol following bolus administration of intravenous contrast. CONTRAST:  56m OMNIPAQUE IOHEXOL 350 MG/ML SOLN COMPARISON:  October 23, 2020. FINDINGS: Lower chest: Lung base assessment includes much of the chest showing enlarging RIGHT-sided effusion and volume loss in the RIGHT chest. There is a drain along the RIGHT subdiaphragmatic region placed into a perihepatic collection with diminished size of the collection compared to the prior exam. Three-vessel coronary artery disease. No substantial pericardial fluid. No acute bony process about the visualized bony thorax. Esophagus mildly patulous. Hepatobiliary: Decreased size of lenticular collections along the RIGHT hepatic margin. Decreased size of infrahepatic fluid following drain placement. Area of hypodensity in the RIGHT hepatic lobe superior to the gallbladder fossa now measures 4.2 x 2.5 cm compared to 5.8 x 5.3 cm, contiguous with the gallbladder. Cholecystostomy tube in place decompressing the gallbladder since the prior study. Pancreas: Atrophy of the pancreas. Spleen: Normal spleen. Adrenals/Urinary Tract: Adrenal glands are normal. Symmetric renal enhancement. No hydronephrosis. Urinary bladder with smooth contours. No perinephric stranding. Stomach/Bowel: No acute gastrointestinal process. The appendix is normal. Colonic diverticulosis. Vascular/Lymphatic: Aortic atherosclerosis without sign of aneurysm. Smooth contour of the IVC. Patent abdominal vessels. There is no gastrohepatic or hepatoduodenal ligament lymphadenopathy. No  retroperitoneal or mesenteric lymphadenopathy. No pelvic sidewall lymphadenopathy. Reproductive: Brachytherapy seeds present in the prostate. Other: No free intraperitoneal air. No substantial ascites with minimal fluid remaining in the gallbladder fossa and along the hepatic margin. No abdominal abscess elsewhere. Musculoskeletal: Spinal degenerative changes without acute or destructive bone process. IMPRESSION: Marked enlargement of RIGHT-sided pleural fluid collection with basilar and generalized volume loss throughout the RIGHT lung since the previous study. Superior perihepatic drain may traverse the now expanded pleural space. While pleural fluid in the RIGHT chest may be reactive, the possibility of chest involvement from abdominal infection is considered based on the above constellation of findings. Fluid sampling and/or drainage as warranted is suggested. Diminished size of intrahepatic pericholecystic abscess with decompression of the gallbladder and near complete resolution of perihepatic fluid. Aortic Atherosclerosis (ICD10-I70.0). These results will be called to the ordering clinician or representative by the Radiologist Assistant, and communication documented in the PACS or [employer] Electronically Signed   By: GZetta BillsM.D.   On: 10/30/2020 15:08    Labs:  CBC: Recent Labs  10/27/20 0809 10/28/20 0733 10/28/20 2040 10/29/20 0040 10/30/20 0327  WBC 14.1* 13.9*  --  13.0* 13.0*  HGB 9.1* 9.6* 9.5* 9.4* 9.8*  HCT 28.1* 28.6* 28.6* 28.0* 29.6*  PLT 325 333  --  317 312    COAGS: Recent Labs    10/15/20 2044  INR 1.4*  APTT 38*    BMP: Recent Labs    10/27/20 0809 10/28/20 0733 10/29/20 0040 10/30/20 0327  NA 132* 130* 130* 129*  K 3.6 4.0 3.8 3.8  CL 98 97* 97* 94*  CO2 '25 24 25 24  '$ GLUCOSE 176* 191* 167* 133*  BUN 24* '21 19 19  '$ CALCIUM 7.8* 7.8* 7.9* 8.0*  CREATININE 1.81* 1.68* 1.63* 1.54*  GFRNONAA 39* 42* 44* 47*    LIVER FUNCTION  TESTS: Recent Labs    10/23/20 1122 10/24/20 0131 10/29/20 0040 10/30/20 0327  BILITOT 0.7 0.7 0.6 0.8  AST 31 34 16 17  ALT 38 41 19 20  ALKPHOS 74 69 54 58  PROT 5.4* 5.0* 5.2* 5.2*  ALBUMIN 1.9* 1.8* 1.6* 1.6*    Assessment and Plan:  Gangrenous cholecystitis and liver abscesses s/p percutaneous cholecystostomy and hepatic abscess drain placement x 2 in IR 10/24/20  Patient is afebrile, WBC is 13. Repeat CT 10/29/20 showed "diminished size of intrahepatic pericholecystic abscess with decompression of the gallbladder and near complete resolution of the perihepatic fluid."    Drain output as follows:  Percutaneous cholecystostomy - 30 ml output in Epic with another 30 ml in gravity bag.  Drain #2 infrahepatic  - 20 ml output documented in Epic with another 10 ml in JP bulb Drain #3 superior perihepatic - 10 ml output documented in Epic with another 30 ml in JP bulb   Drain #2 removed at the bedside secondary to low daily output and near resolution of the the fluid on CT imaging.   IR recommends to continue flushing drains and documenting output each shift. Change the dressing daily or as needed. Please contact IR APP or on call IR MD prior to patient d/c to ensure appropriate follow up plans are in place. Typically patient will follow up with IR clinic 10-14 days post d/c for repeat imaging/possible drain injection of abscess drains. Percutaneous cholecystostomy likely to remain in place until cholecystectomy. IR scheduler will contact patient with date/time of appointment. Patient will need to flush abscess drains QD with 5 cc NS, record output QD from each drain, dressing changes every 2-3 days or earlier if soiled.    IR will continue to follow - please call with questions or concerns.   Electronically Signed: Soyla Dryer, AGACNP-BC [phone] 10/30/2020, 4:09 PM   I spent a total of 15 Minutes at the the patient's bedside AND on the patient's hospital floor or unit,  greater than 50% of which was counseling/coordinating care for percutaneous cholecystostomy and hepatic abscess drain x 2 follow up.

## 2020-10-30 NOTE — Progress Notes (Signed)
Physical Therapy Treatment Patient Details Name: James Glover MRN: [id number] DOB: [birth year] Today's Date: 10/30/2020    History of Present Illness The pt is a 75 yo male presenting 8/25 from SNF due to ultrasound and HIDA scan concerning for gangrenous cholecystitis and larger liver abscess. Pt reccently hospitalized 8/17-8/22 due to acute cholecystitis complicated by subcapsular live abscess and infection and d/c to SNF for short term rehab. PMH includes: aortic atherosclerosis, CKD III, HTN, DM II, and prostate cancer.    PT Comments    Pt seated in recliner on arrival.  He reports feeling cold and continues to report diarrhea is still hindering his function.  PTA applied brief to allow for progression of mobility.  He becomes fatigued quickly and required several rest periods. Pt too fatigued to attempt stair training.  He is eager to return home but based on his presentation he would greatly benefit from Short Term SNF placement until strength and function improves.  Will inform supervising PT of need for update in recommendations.      Follow Up Recommendations  Supervision for mobility/OOB;SNF (Pt is refusing placement so will require HHPT)     Equipment Recommendations  Rolling walker with 5" wheels;Other (comment) (tub bench ( may need ambulance d/c home as he lacks functional capacity to climb stairs at this time).)    Recommendations for Other Services       Precautions / Restrictions Precautions Precautions: Fall;Other (comment) Precaution Comments: chole tube, x2 drains, watch O2 Restrictions Weight Bearing Restrictions: No    Mobility  Bed Mobility               General bed mobility comments: Pt seated in recliner this session.    Transfers Overall transfer level: Needs assistance Equipment used: Rolling walker (2 wheeled) Transfers: Sit to/from Stand Sit to Stand: Min assist         General transfer comment: Cues for hand placement to and from  seated surface.  He required boosting to achieve standing from surface with out arm rests.  Ambulation/Gait Ambulation/Gait assistance: Min guard Gait Distance (Feet): 10 Feet (+ 15 ft + 40 ft ( very winded after lengthier trial)) Assistive device: Rolling walker (2 wheeled) Gait Pattern/deviations: Step-through pattern;Decreased stride length;Trunk flexed Gait velocity: decreased   General Gait Details: Cues for proximity to RW to improve posture.  pt fatigues quickly.  he presents with DOE.  SPO2 93% on RA post trial.   Stairs Stairs:  (refused stair training due to fatigue.)           Wheelchair Mobility    Modified Rankin (Stroke Patients Only)       Balance Overall balance assessment: Needs assistance Sitting-balance support: No upper extremity supported Sitting balance-[name] Scale: Good       Standing balance-[name] Scale: Fair                              Cognition Arousal/Alertness: Awake/alert Behavior During Therapy: WFL for tasks assessed/performed Overall Cognitive Status: Impaired/Different from baseline Area of Impairment: Safety/judgement;Problem solving                         Safety/Judgement: Decreased awareness of safety;Decreased awareness of deficits   Problem Solving: Slow processing;Difficulty sequencing;Requires verbal cues;Requires tactile cues General Comments: Poor safety awareness with use of RW.      Exercises      General Comments  Pertinent Vitals/Pain Pain Assessment: Faces Faces Pain Scale: Hurts even more Pain Location: abdomen Pain Descriptors / Indicators: Grimacing;Discomfort Pain Intervention(s): Monitored during session;Repositioned    Home Living                      Prior Function            PT Goals (current goals can now be found in the care plan section) Acute Rehab PT Goals Patient Stated Goal: to get better Potential to Achieve Goals: Fair Progress towards PT  goals: Progressing toward goals    Frequency    Min 3X/week      PT Plan Discharge plan needs to be updated    Co-evaluation              AM-PAC PT "6 Clicks" Mobility   Outcome Measure  Help needed turning from your back to your side while in a flat bed without using bedrails?: A Little Help needed moving from lying on your back to sitting on the side of a flat bed without using bedrails?: A Little Help needed moving to and from a bed to a chair (including a wheelchair)?: A Little Help needed standing up from a chair using your arms (e.g., wheelchair or bedside chair)?: A Little Help needed to walk in hospital room?: A Little Help needed climbing 3-5 steps with a railing? : A Little 6 Click Score: 18    End of Session Equipment Utilized During Treatment: Gait belt Activity Tolerance: Patient limited by fatigue Patient left: in chair;with call bell/phone within reach;with family/visitor present Nurse Communication: Mobility status PT Visit Diagnosis: Unsteadiness on feet (R26.81);Other abnormalities of gait and mobility (R26.89);Muscle weakness (generalized) (M62.81)     Time: [id number] PT Time Calculation (min) (ACUTE ONLY): 39 min  Charges:  $Gait Training: 8-22 mins $Therapeutic Activity: 23-37 mins                     Erasmo Leventhal , PTA Acute Rehabilitation Services Pager [phone] Office [phone],   Cristela Blue 10/30/2020, 11:10 AM

## 2020-10-30 NOTE — Progress Notes (Addendum)
PROGRESS NOTE   James Glover  [id number] DOB: 1946/01/05 DOA: 10/23/2020 PCP: [name], MD  Brief Narrative:  75 year old white male CKD stage III, prostate CA in remission status post brachytherapy 2020 DM TY 2 HTN HLD reflux Recent hospitalization 8/17--8/22 with acute cholecystitis Rx Unasyn-->Augmentin to complete 9/16 Blood cultures at that time grew Streptococcus [place] lytic [place] and patient was converted to oral Augmentin as repeat cultures 8/19  neg-- patient transferred to skilled facility  He developed abdominal RUQ pain after diet resumed-he went to follow-up with general surgery office and was found to have enlarged liver abscess on CT with complex intraparenchymal pericholecystic abscesses  General surgery recommended drainage  8/26 perc cholecystotomy tubes placed in for a hepatic abscess, superior perihepatic abscess 8/31 repeat CT abdomen -drain #2 removed    Hospital-Problem based course  Sepsis on admission secondary to acalculous cholecystitis with liver abscess complicated by severe sepsis with endorgan damage on admission Hepatic drains x 1 + cholecystotomy tube CT 9/1 reviewed-overall decreasing size of several areas of Abscess.  Drain #2 pulled by IR 9/1 IR recommending flushing abscess c 5cc NS, dressing changes q2-3 d,  White count remains 13 range--transitioned the IV zosyn-->Augmentin Eventual outpatient cholecystectomy per Dr. 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  Start lasix, get cxr in 1-2 days to see if fluid present Diarrhea not otherwise specified Hold testing for C. difficile -- not on any laxatives at this time AKI superimposed CKD 3 AA with hyponatremia hypokalemia IV fluids initiated -held--Stop HCTZ which can cause low sodium Now resolved electrolyte  abnormalities--creatinine seems to be close to his baseline DM TY 2 with dyslipidemia Eating 100% meals-CBGs ranging 142-175 Continue 12 units of long-acting insulin with moderate sliding scale HTN uncontrolled Continue amlodipine 5, hydralazine 25 every 6 as needed, HCTZ 25 held--escalate amlodipine based on this Stage II left buttock pressure ulcer from prior to admission Encourage mobility Prostate cancer status post brachytherapy 2020 Continue outpatient evaluation management and continue Ditropan 5 twice daily, Flomax 0.4 twice daily  DVT prophylaxis: Lovenox Code Status: Full Family Communication: Discussed with wife at the bedside [phone] Disposition:  Status is: Inpatient  Remains inpatient appropriate because:Persistent severe electrolyte disturbances, IV treatments appropriate due to intensity of illness or inability to take PO, and Inpatient level of care appropriate due to severity of illness  Dispo: The patient is from: Home              Anticipated d/c is to:  Home with home health              Patient currently is not medically stable to d/c.   Difficult to place patient No  Consultants:  General surgery Interventional radiology  Procedures:   8/26 10 French cholecystotomy 8/26 infrahepatic collection 10 French drain + suction bulb 8/26 severe perihepatic 10 French drain with suction bulb 8/26 intrahepatic collections noted GB fossa drained with 18 G needle CT repeat 9/1  Marked enlargement of RIGHT-sided pleural fluid collection with basilar and generalized volume loss throughout the RIGHT lung since the previous study. Superior perihepatic drain may traverse the now expanded pleural space. While pleural fluid in the RIGHT chest may be reactive, the possibility of chest involvement from abdominal infection is considered based on the above constellation of findings. Fluid sampling and/or drainage as warranted is suggested.   Diminished size of  intrahepatic pericholecystic abscess with decompression of the gallbladder and near complete resolution of perihepatic fluid.  Antimicrobials: Zosyn-->augmentin   Subjective:  Overall improved.  Nurse reports some wheeze this am OOB this pm--some loose stool still  No n/v/fever chills Some mild abd discomfort   Objective: Vitals:   10/29/20 1541 10/29/20 2019 10/30/20 0421 10/30/20 0755  BP: (!) 150/56 (!) 164/70 (!) 170/56 (!) 154/63  Pulse: 76 95 84 87  Resp: '17 17 17 16  '$ Temp: 97.8 F (36.6 C) 98.4 F (36.9 C) 98.4 F (36.9 C) 98.3 F (36.8 C)  TempSrc: Oral Oral Oral Oral  SpO2: 97% 93% 94% 92%  Weight:        Intake/Output Summary (Last 24 hours) at 10/30/2020 1205 Last data filed at 10/30/2020 0900 Gross per 24 hour  Intake 380 ml  Output 1260 ml  Net -880 ml    Filed Weights   10/27/20 1335  Weight: 133.8 kg    Examination:  Eomi ncat no focal deficit alert coherent in nad No ict no pallor Cta b no rales rhonchi Abd soft nt nd no rebound --drains seem to be in place, well positioned Overall has swelling Neuro intact moving limbs x 4 without deficit, power 5/5, reflexes deferred   Data Reviewed: personally reviewed   CBC    Component Value Date/Time   WBC 13.0 (H) 10/30/2020 0327   RBC 3.11 (L) 10/30/2020 0327   HGB 9.8 (L) 10/30/2020 0327   HGB 12.3 (L) 08/19/2020 0940   HCT 29.6 (L) 10/30/2020 0327   HCT 36.5 (L) 08/19/2020 0940   PLT 312 10/30/2020 0327   PLT 151 08/19/2020 0940   MCV 95.2 10/30/2020 0327   MCV 97 08/19/2020 0940   MCH 31.5 10/30/2020 0327   MCHC 33.1 10/30/2020 0327   RDW 13.5 10/30/2020 0327   RDW 12.7 08/19/2020 0940   LYMPHSABS 0.6 (L) 10/26/2020 0758   LYMPHSABS 0.5 (L) 08/19/2020 0940   MONOABS 1.1 (H) 10/26/2020 0758   EOSABS 0.2 10/26/2020 0758   EOSABS 0.2 08/19/2020 0940   BASOSABS 0.1 10/26/2020 0758   BASOSABS 0.0 08/19/2020 0940   CMP Latest Ref Rng & Units 10/30/2020 10/29/2020 10/28/2020  Glucose 70 -  99 mg/dL 133(H) 167(H) 191(H)  BUN 8 - 23 mg/dL '19 19 21  '$ Creatinine 0.61 - 1.24 mg/dL 1.54(H) 1.63(H) 1.68(H)  Sodium 135 - 145 mmol/L 129(L) 130(L) 130(L)  Potassium 3.5 - 5.1 mmol/L 3.8 3.8 4.0  Chloride 98 - 111 mmol/L 94(L) 97(L) 97(L)  CO2 22 - 32 mmol/L '24 25 24  '$ Calcium 8.9 - 10.3 mg/dL 8.0(L) 7.9(L) 7.8(L)  Total Protein 6.5 - 8.1 g/dL 5.2(L) 5.2(L) -  Total Bilirubin 0.3 - 1.2 mg/dL 0.8 0.6 -  Alkaline Phos 38 - 126 U/L 58 54 -  AST 15 - 41 U/L 17 16 -  ALT 0 - 44 U/L 20 19 -     Radiology Studies: No results found.   Scheduled Meds:  amLODipine  5 mg Oral Daily   amoxicillin-clavulanate  1 tablet Oral Q12H   vitamin C  250 mg Oral BID   aspirin EC  81 mg Oral Daily   enoxaparin (LOVENOX) injection  60 mg Subcutaneous Daily   famotidine  40 mg Oral QHS   furosemide  40 mg Oral Daily   insulin aspart  0-15 Units Subcutaneous TID WC   insulin glargine-yfgn  12 Units Subcutaneous QHS   multivitamin with minerals  1 tablet Oral Daily  oxybutynin  5 mg Oral BID   pantoprazole  40 mg Oral Daily   Ensure Max Protein  11 oz Oral BID   saccharomyces boulardii  250 mg Oral BID   simvastatin  20 mg Oral QHS   sodium chloride flush  5 mL Intracatheter Q8H   tamsulosin  0.4 mg Oral BID PC   Continuous Infusions:   LOS: 7 days   Time spent: [place], MD [facility] To contact the attending provider between 7A-7P or the covering provider during after hours 7P-7A, please log into the web site [email or web address] and access using universal [place] password for that web site. If you do not have the password, please call the hospital operator.  10/30/2020, 12:05 PM

## 2020-10-30 NOTE — Plan of Care (Signed)

## 2020-10-31 LAB — CBC WITH DIFFERENTIAL/PLATELET
Abs Immature Granulocytes: 0.18 10*3/uL — ABNORMAL HIGH (ref 0.00–0.07)
Basophils Absolute: 0.1 10*3/uL (ref 0.0–0.1)
Basophils Relative: 0 %
Eosinophils Absolute: 0.1 10*3/uL (ref 0.0–0.5)
Eosinophils Relative: 1 %
HCT: 28 % — ABNORMAL LOW (ref 39.0–52.0)
Hemoglobin: 9.4 g/dL — ABNORMAL LOW (ref 13.0–17.0)
Immature Granulocytes: 1 %
Lymphocytes Relative: 4 %
Lymphs Abs: 0.6 10*3/uL — ABNORMAL LOW (ref 0.7–4.0)
MCH: 31.5 pg (ref 26.0–34.0)
MCHC: 33.6 g/dL (ref 30.0–36.0)
MCV: 94 fL (ref 80.0–100.0)
Monocytes Absolute: 1.1 10*3/uL — ABNORMAL HIGH (ref 0.1–1.0)
Monocytes Relative: 8 %
Neutro Abs: 11.5 10*3/uL — ABNORMAL HIGH (ref 1.7–7.7)
Neutrophils Relative %: 86 %
Platelets: 282 10*3/uL (ref 150–400)
RBC: 2.98 MIL/uL — ABNORMAL LOW (ref 4.22–5.81)
RDW: 13.4 % (ref 11.5–15.5)
WBC: 13.5 10*3/uL — ABNORMAL HIGH (ref 4.0–10.5)
nRBC: 0 % (ref 0.0–0.2)

## 2020-10-31 LAB — COMPREHENSIVE METABOLIC PANEL
ALT: 20 U/L (ref 0–44)
AST: 16 U/L (ref 15–41)
Albumin: 1.7 g/dL — ABNORMAL LOW (ref 3.5–5.0)
Alkaline Phosphatase: 58 U/L (ref 38–126)
Anion gap: 11 (ref 5–15)
BUN: 25 mg/dL — ABNORMAL HIGH (ref 8–23)
CO2: 24 mmol/L (ref 22–32)
Calcium: 7.9 mg/dL — ABNORMAL LOW (ref 8.9–10.3)
Chloride: 91 mmol/L — ABNORMAL LOW (ref 98–111)
Creatinine, Ser: 2.44 mg/dL — ABNORMAL HIGH (ref 0.61–1.24)
GFR, Estimated: 27 mL/min — ABNORMAL LOW (ref >60)
Glucose, Bld: 157 mg/dL — ABNORMAL HIGH (ref 70–99)
Potassium: 3.7 mmol/L (ref 3.5–5.1)
Sodium: 126 mmol/L — ABNORMAL LOW (ref 135–145)
Total Bilirubin: 0.8 mg/dL (ref 0.3–1.2)
Total Protein: 5.3 g/dL — ABNORMAL LOW (ref 6.5–8.1)

## 2020-10-31 LAB — GLUCOSE, CAPILLARY
Glucose-Capillary: 161 mg/dL — ABNORMAL HIGH (ref 70–99)
Glucose-Capillary: 158 mg/dL — ABNORMAL HIGH (ref 70–99)
Glucose-Capillary: 178 mg/dL — ABNORMAL HIGH (ref 70–99)
Glucose-Capillary: 195 mg/dL — ABNORMAL HIGH (ref 70–99)
Glucose-Capillary: 160 mg/dL — ABNORMAL HIGH (ref 70–99)

## 2020-10-31 LAB — CREATININE, URINE, RANDOM: Creatinine, Urine: 171.29 mg/dL

## 2020-10-31 MED ORDER — AMLODIPINE BESYLATE 10 MG PO TABS
10.0000 mg | ORAL_TABLET | Freq: Every day | ORAL | Status: DC
  Administered 2020-11-01 – 2020-11-05 (×5): 10 mg via ORAL
  Filled 2020-10-31 (×5): qty 1

## 2020-10-31 MED ORDER — ENOXAPARIN SODIUM 80 MG/0.8ML IJ SOSY
65.0000 mg | PREFILLED_SYRINGE | Freq: Every day | INTRAMUSCULAR | Status: DC
  Administered 2020-11-01 – 2020-11-02 (×2): 65 mg via SUBCUTANEOUS
  Filled 2020-10-31 (×2): qty 0.8

## 2020-10-31 NOTE — Progress Notes (Signed)
PROGRESS NOTE   James Glover  [id number] DOB: 28-May-1945 DOA: 10/23/2020 PCP: [name], MD  Brief Narrative:  75 year old white male CKD stage III, prostate CA in remission status post brachytherapy 2020 DM TY 2 HTN HLD reflux Recent hospitalization 8/17--8/22 with acute cholecystitis Rx Unasyn-->Augmentin to complete 9/16 Blood cultures at that time grew Streptococcus [place] lytic [place] and patient was converted to oral Augmentin as repeat cultures 8/19  neg-- patient transferred to skilled facility  He developed abdominal RUQ pain after diet resumed-he went to follow-up with general surgery office and was found to have enlarged liver abscess on CT with complex intraparenchymal pericholecystic abscesses  General surgery recommended drainage  8/26 perc cholecystotomy tubes placed in for a hepatic abscess, superior perihepatic abscess 8/31 repeat CT abdomen -drain #2 removed    Hospital-Problem based course  Sepsis on admission secondary to acalculous cholecystitis with liver abscess complicated by severe sepsis with endorgan damage on admission Hepatic drains x 1 + cholecystotomy tube CT 9/1 reviewed-overall decreasing size of several areas of Abscess.  Drain #2 pulled by IR 9/1 IR recommending flushing abscess c 5cc NS, dressing changes q2-3 d,  White count remains 13 range--transitioned the IV zosyn-->Augmentin on 9/1 Eventual outpatient cholecystectomy per Dr. Joeseph Amor to stay in at this time Pain control tramadol 50 every 12 as needed moderate pain-discontinue IV Dilaudid ATN likely secondary to contrast-induced nephropathy causing AKI superimposed CKD 3 AA  rising creatinine--could be combination of IV contrast for CT scan Bladder scan is negative Await FE urea--- if continues to worsen, would ask renal to see Pleural effusion, asymptomatic likely hepatic hydrothorax Absence of fever and hypoxia 0.2 benign diagnosis Lasix held on 9/1 lasix, get cxr in am as not  hypoxic obtain 2 view chest x-ray in the morning Diarrhea not otherwise specified Hold testing for C. difficile -- not on any laxatives at this time DM TY 2 with dyslipidemia Eating 100% meals-CBGs ranging 150-160 Continue 12 units of long-acting insulin with moderate sliding scale HTN uncontrolled Increased amlodipine to 10, hydralazine 25 every 6 as needed,  HCTZ 25 held Stage II left buttock pressure ulcer from prior to admission Encourage mobility Prostate cancer status post brachytherapy 2020 Continue outpatient evaluation management and continue Ditropan 5 twice daily, Flomax 0.4 twice daily  DVT prophylaxis: Lovenox Code Status: Full Family Communication: Discussed with wife at the bedside [phone] Disposition:  Status is: Inpatient  Remains inpatient appropriate because:Persistent severe electrolyte disturbances, IV treatments appropriate due to intensity of illness or inability to take PO, and Inpatient level of care appropriate due to severity of illness  Dispo: The patient is from: Home              Anticipated d/c is to:  Home with home health              Patient currently is not medically stable to d/c.   Difficult to place patient No  Consultants:  General surgery Interventional radiology  Procedures:   8/26 10 French cholecystotomy 8/26 infrahepatic collection 10 French drain + suction bulb 8/26 severe perihepatic 10 French drain with suction bulb 8/26 intrahepatic collections noted GB fossa drained with 18 G needle CT repeat 9/1  Marked enlargement of RIGHT-sided pleural fluid collection with basilar and generalized volume loss throughout the RIGHT lung since the previous study. Superior perihepatic drain may traverse the now expanded pleural space. While pleural fluid in the RIGHT chest may be reactive, the possibility of chest involvement from abdominal infection is  considered based on the above constellation of findings. Fluid sampling and/or  drainage as warranted is suggested.   Diminished size of intrahepatic pericholecystic abscess with decompression of the gallbladder and near complete resolution of perihepatic fluid.  Antimicrobials: Zosyn-->augmentin   Subjective:  Seems a little dejected on hearing about his kidneys-Long discussion with him and his wife about possible causes including contrast, Zosyn, intermittent Lasix not short of breath he ate fish last night he ate a full bowl of oatmeal this morning and is trying to keep up with his liquids   Objective: Vitals:   10/30/20 2237 10/31/20 0409 10/31/20 0412 10/31/20 0808  BP: (!) 156/66 (!) 156/101 (!) 167/69 (!) 148/61  Pulse:  87  85  Resp:    15  Temp:  98.9 F (37.2 C)  98.8 F (37.1 C)  TempSrc:  Oral  Oral  SpO2:  95%  92%  Weight:      Height:        Intake/Output Summary (Last 24 hours) at 10/31/2020 1158 Last data filed at 10/31/2020 0900 Gross per 24 hour  Intake 490 ml  Output 920 ml  Net -430 ml    Filed Weights   10/27/20 1335 10/30/20 1216  Weight: 133.8 kg 134.2 kg    Examination:  ncat no focal deficit alert coherent in nad No ict no pallor Cta b decreased air entry right posterolateral lung fields Abd soft nt nd no rebound --1 drain has been removed Overall has swelling Neuro intact moving limbs x 4 without deficit, power 5/5, reflexes deferred   Data Reviewed: personally reviewed   CBC    Component Value Date/Time   WBC 13.5 (H) 10/31/2020 0044   RBC 2.98 (L) 10/31/2020 0044   HGB 9.4 (L) 10/31/2020 0044   HGB 12.3 (L) 08/19/2020 0940   HCT 28.0 (L) 10/31/2020 0044   HCT 36.5 (L) 08/19/2020 0940   PLT 282 10/31/2020 0044   PLT 151 08/19/2020 0940   MCV 94.0 10/31/2020 0044   MCV 97 08/19/2020 0940   MCH 31.5 10/31/2020 0044   MCHC 33.6 10/31/2020 0044   RDW 13.4 10/31/2020 0044   RDW 12.7 08/19/2020 0940   LYMPHSABS 0.6 (L) 10/31/2020 0044   LYMPHSABS 0.5 (L) 08/19/2020 0940   MONOABS 1.1 (H) 10/31/2020 0044    EOSABS 0.1 10/31/2020 0044   EOSABS 0.2 08/19/2020 0940   BASOSABS 0.1 10/31/2020 0044   BASOSABS 0.0 08/19/2020 0940   CMP Latest Ref Rng & Units 10/31/2020 10/30/2020 10/29/2020  Glucose 70 - 99 mg/dL 157(H) 133(H) 167(H)  BUN 8 - 23 mg/dL 25(H) 19 19  Creatinine 0.61 - 1.24 mg/dL 2.44(H) 1.54(H) 1.63(H)  Sodium 135 - 145 mmol/L 126(L) 129(L) 130(L)  Potassium 3.5 - 5.1 mmol/L 3.7 3.8 3.8  Chloride 98 - 111 mmol/L 91(L) 94(L) 97(L)  CO2 22 - 32 mmol/L '24 24 25  '$ Calcium 8.9 - 10.3 mg/dL 7.9(L) 8.0(L) 7.9(L)  Total Protein 6.5 - 8.1 g/dL 5.3(L) 5.2(L) 5.2(L)  Total Bilirubin 0.3 - 1.2 mg/dL 0.8 0.8 0.6  Alkaline Phos 38 - 126 U/L 58 58 54  AST 15 - 41 U/L '16 17 16  '$ ALT 0 - 44 U/L '20 20 19     '$ Radiology Studies: CT ABDOMEN PELVIS W CONTRAST  Result Date: 10/30/2020 CLINICAL DATA:  Intra-abdominal abscesses, follow-up assessment EXAM: CT ABDOMEN AND PELVIS WITH CONTRAST TECHNIQUE: Multidetector CT imaging of the abdomen and pelvis was performed using the standard protocol following bolus administration of intravenous contrast.  CONTRAST:  19m OMNIPAQUE IOHEXOL 350 MG/ML SOLN COMPARISON:  October 23, 2020. FINDINGS: Lower chest: Lung base assessment includes much of the chest showing enlarging RIGHT-sided effusion and volume loss in the RIGHT chest. There is a drain along the RIGHT subdiaphragmatic region placed into a perihepatic collection with diminished size of the collection compared to the prior exam. Three-vessel coronary artery disease. No substantial pericardial fluid. No acute bony process about the visualized bony thorax. Esophagus mildly patulous. Hepatobiliary: Decreased size of lenticular collections along the RIGHT hepatic margin. Decreased size of infrahepatic fluid following drain placement. Area of hypodensity in the RIGHT hepatic lobe superior to the gallbladder fossa now measures 4.2 x 2.5 cm compared to 5.8 x 5.3 cm, contiguous with the gallbladder. Cholecystostomy tube in  place decompressing the gallbladder since the prior study. Pancreas: Atrophy of the pancreas. Spleen: Normal spleen. Adrenals/Urinary Tract: Adrenal glands are normal. Symmetric renal enhancement. No hydronephrosis. Urinary bladder with smooth contours. No perinephric stranding. Stomach/Bowel: No acute gastrointestinal process. The appendix is normal. Colonic diverticulosis. Vascular/Lymphatic: Aortic atherosclerosis without sign of aneurysm. Smooth contour of the IVC. Patent abdominal vessels. There is no gastrohepatic or hepatoduodenal ligament lymphadenopathy. No retroperitoneal or mesenteric lymphadenopathy. No pelvic sidewall lymphadenopathy. Reproductive: Brachytherapy seeds present in the prostate. Other: No free intraperitoneal air. No substantial ascites with minimal fluid remaining in the gallbladder fossa and along the hepatic margin. No abdominal abscess elsewhere. Musculoskeletal: Spinal degenerative changes without acute or destructive bone process. IMPRESSION: Marked enlargement of RIGHT-sided pleural fluid collection with basilar and generalized volume loss throughout the RIGHT lung since the previous study. Superior perihepatic drain may traverse the now expanded pleural space. While pleural fluid in the RIGHT chest may be reactive, the possibility of chest involvement from abdominal infection is considered based on the above constellation of findings. Fluid sampling and/or drainage as warranted is suggested. Diminished size of intrahepatic pericholecystic abscess with decompression of the gallbladder and near complete resolution of perihepatic fluid. Aortic Atherosclerosis (ICD10-I70.0). These results will be called to the ordering clinician or representative by the Radiologist Assistant, and communication documented in the PACS or [employer] Electronically Signed   By: GZetta BillsM.D.   On: 10/30/2020 15:08     Scheduled Meds:  amLODipine  5 mg Oral Daily   amoxicillin-clavulanate   1 tablet Oral Q12H   vitamin C  250 mg Oral BID   aspirin EC  81 mg Oral Daily   enoxaparin (LOVENOX) injection  60 mg Subcutaneous Daily   famotidine  40 mg Oral QHS   insulin aspart  0-15 Units Subcutaneous TID WC   insulin glargine-yfgn  12 Units Subcutaneous QHS   multivitamin with minerals  1 tablet Oral Daily   oxybutynin  5 mg Oral BID   pantoprazole  40 mg Oral Daily   Ensure Max Protein  11 oz Oral BID   saccharomyces boulardii  250 mg Oral BID   simvastatin  20 mg Oral QHS   sodium chloride flush  5 mL Intracatheter Q8H   tamsulosin  0.4 mg Oral BID PC   Continuous Infusions:   LOS: 8 days   Time spent: 359 JNita Sells MD [facility] To contact the attending provider between 7A-7P or the covering provider during after hours 7P-7A, please log into the web site [email or web address] and access using universal [place] password for that web site. If you do not have the password, please call the hospital operator.  10/31/2020, 11:58 AM

## 2020-10-31 NOTE — Progress Notes (Signed)
Pt refusing Ensure drinks. Would prefer something non-dairy as a supplement.  Pt had one loose BM this morning, but was able to make it to the toilet in time.  No pain reported today.  OT reported desat when doing steps but returned to WNL (mid 90s) once in room and could catch breath.  Pt urinated for specimen just after shift change.

## 2020-10-31 NOTE — Progress Notes (Signed)
Patient ID: James Glover, male   DOB: September 04, 1945, 75 y.o.   MRN: [id number]  Patient had repeat CT yesterday - improving perihepatic fluid collections.  One drain removed.  Large right pleural effusion - management per [facility] team.  Acute cholecystitis - gangrenous Pericholecystic/ perihepatic fluid collections   S/p percutaneous cholecystostomy S/p percutaneous drainage of two perihepatic fluid collections, now improved with removal of one of the drains   Continue antibiotics - consider transition to PO Augmentin as WBC normalizes.    Will follow along peripherally, in case patient's status worsens and he needs urgent surgery.  For now, he seems to be improved with drains in place.    Follow-up with Dr. Constance Haw for eventual interval cholecystectomy.  [name]. [name], MD, [facility] Surgery  General Surgery   10/31/2020 8:06 AM

## 2020-10-31 NOTE — Progress Notes (Signed)
Pt's wife demonstrated flushing (JP drain), emptying, and measuring output for drains. Pt assisted RN with changing dressing.  Sites of tubes unremarkable. Tender to the touch.

## 2020-10-31 NOTE — Progress Notes (Signed)
Physical Therapy Treatment Patient Details Name: James Glover MRN: [id number] DOB: [birth year] Today's Date: 10/31/2020    History of Present Illness The pt is a 75 yo male presenting 8/25 from SNF due to ultrasound and HIDA scan concerning for gangrenous cholecystitis and larger liver abscess. Pt reccently hospitalized 8/17-8/22 due to acute cholecystitis complicated by subcapsular live abscess and infection and d/c to SNF for short term rehab. PMH includes: aortic atherosclerosis, CKD III, HTN, DM II, and prostate cancer.    PT Comments    The pt was seen for education on stair training and progression of OOB mobility this afternoon. The pt presents with significant deficits in endurance and strength which he admits have progressed during this admission. Significant time spent discussing safe d/c plan in regards to energy conservation techniques for home, assist and DME needs, and importance of progressing mobility to recover lost strength/endurance and maintain mobility. The pt was able to complete navigation of 4 stairs but reports significant fatigue and SOB following activity, pulse ox reading SpO2 of 80% on RA. With guided breathing and continued seated rest, SpO2 rose to 93% after 4 min, pt reports resolution of SOB. RN notified, pt will benefit from walking sats to determine if supplemental O2 needed for activity at home. Given significant deficits in endurance, stability, and safety awareness, I would recommend SNF level rehab (and did voice this recommendation to the pt and his wife), but both are adamant about returning home with HHPT. Will require max HHPT services, and continued PT acutely to determine O2 needs and to progress education regarding mobility and safety at home.     Follow Up Recommendations  Supervision for mobility/OOB (Pt is refusing placement so will require HHPT)     Equipment Recommendations  Rolling walker with 5" wheels;Other (comment);Wheelchair (measurements  PT);Wheelchair cushion (measurements PT) (tub bench)    Recommendations for Other Services       Precautions / Restrictions Precautions Precautions: Fall;Other (comment) Precaution Comments: chole tube, x2 drains, watch O2 Restrictions Weight Bearing Restrictions: No    Mobility  Bed Mobility Overal bed mobility: Needs Assistance             General bed mobility comments: pt OOB at start and end of session    Transfers Overall transfer level: Needs assistance Equipment used: Rolling walker (2 wheeled) Transfers: Sit to/from Stand Sit to Stand: Min assist         General transfer comment: minA to steady, able to complete without overt LOB  Ambulation/Gait Ambulation/Gait assistance: Min guard Gait Distance (Feet): 4 Feet (x3) Assistive device: Rolling walker (2 wheeled) Gait Pattern/deviations: Step-through pattern;Decreased stride length;Trunk flexed Gait velocity: decreased Gait velocity interpretation: <1.31 ft/sec, indicative of household ambulator General Gait Details: cues for posture and poximity to RW, pt sitting preemptively. pt fatigues very quickly   Stairs Stairs: Yes Stairs assistance: Min assist Stair Management: Sideways;One rail Right;Step to pattern Number of Stairs: 4 General stair comments: pt completing with sideways technique with step-to pattern so BUE could be supported on rail, minA to steady   Information systems manager mobility: Yes Wheelchair propulsion: Both upper extremities Wheelchair parts: Needs assistance Distance: 70 Wheelchair Assistance Details (indicate cue type and reason): pt only able to propel himself x20 ft before fatiguing. then needing to be pushed  Modified Rankin (Stroke Patients Only)       Balance Overall balance assessment: Needs assistance Sitting-balance support: No upper extremity supported Sitting balance-[name] Scale: Good Sitting balance - Comments:  seated EOB      Standing balance-[name] Scale: Fair Standing balance comment: able to static stand with single UE support, heavy reliance on BUE support for gait                            Cognition Arousal/Alertness: Awake/alert Behavior During Therapy: WFL for tasks assessed/performed Overall Cognitive Status: Impaired/Different from baseline Area of Impairment: Attention;Following commands;Memory;Safety/judgement;Awareness;Problem solving                   Current Attention Level: Focused Memory: Decreased recall of precautions;Decreased short-term memory Following Commands: Follows one step commands consistently;Follows one step commands with increased time Safety/Judgement: Decreased awareness of safety;Decreased awareness of deficits Awareness: Intellectual Problem Solving: Slow processing;Difficulty sequencing;Requires verbal cues;Requires tactile cues General Comments: pt with very limited safety awareness, resistant at times to education on importance of continued mobility at home to reduce impact of hospitalization on independence, endurance, and strength. Pt with repeated incidents of decreased STM and awareness through session such as asking for mask that was already on his face.      Exercises      General Comments General comments (skin integrity, edema, etc.): Pt with HR to 100 after navigation of 4 stairs. Pt reporting SOB, pulse ox reading 80% after pt seated following stair navigation. no additional machine to confirm reading, after 5 min seated rest SpO2 improved to 93% on RA, left off O2 and RN alerted      Pertinent Vitals/Pain Pain Assessment: Faces Faces Pain Scale: Hurts a little bit Pain Location: generalized discomfort, abdomen Pain Descriptors / Indicators: Grimacing;Discomfort Pain Intervention(s): Limited activity within patient's tolerance;Monitored during session;Repositioned     PT Goals (current goals can now be found in the care plan section)  Acute Rehab PT Goals Patient Stated Goal: to go home PT Goal Formulation: With patient/family Time For Goal Achievement: 11/07/20 Potential to Achieve Goals: Fair Progress towards PT goals: Progressing toward goals    Frequency    Min 3X/week      PT Plan Current plan remains appropriate       AM-PAC PT "6 Clicks" Mobility   Outcome Measure  Help needed turning from your back to your side while in a flat bed without using bedrails?: A Little Help needed moving from lying on your back to sitting on the side of a flat bed without using bedrails?: A Little Help needed moving to and from a bed to a chair (including a wheelchair)?: A Little Help needed standing up from a chair using your arms (e.g., wheelchair or bedside chair)?: A Little Help needed to walk in hospital room?: A Little Help needed climbing 3-5 steps with a railing? : A Little 6 Click Score: 18    End of Session Equipment Utilized During Treatment: Gait belt Activity Tolerance: Patient limited by fatigue Patient left: in chair;with call bell/phone within reach;with family/visitor present Nurse Communication: Mobility status PT Visit Diagnosis: Unsteadiness on feet (R26.81);Other abnormalities of gait and mobility (R26.89);Muscle weakness (generalized) (M62.81)     Time: [id number] PT Time Calculation (min) (ACUTE ONLY): 29 min  Charges:  $Gait Training: 8-22 mins $Therapeutic Activity: 8-22 mins                     West Carbo, PT, DPT   Acute Rehabilitation Department Pager #: [phone]   Sandra Cockayne 10/31/2020, 6:27 PM

## 2020-11-01 ENCOUNTER — Inpatient Hospital Stay (HOSPITAL_COMMUNITY): Payer: Medicare PPO

## 2020-11-01 DIAGNOSIS — E1165 Type 2 diabetes mellitus with hyperglycemia: Secondary | ICD-10-CM

## 2020-11-01 LAB — RENAL FUNCTION PANEL
Albumin: 1.7 g/dL — ABNORMAL LOW (ref 3.5–5.0)
Anion gap: 10 (ref 5–15)
BUN: 31 mg/dL — ABNORMAL HIGH (ref 8–23)
CO2: 25 mmol/L (ref 22–32)
Calcium: 7.8 mg/dL — ABNORMAL LOW (ref 8.9–10.3)
Chloride: 90 mmol/L — ABNORMAL LOW (ref 98–111)
Creatinine, Ser: 3.31 mg/dL — ABNORMAL HIGH (ref 0.61–1.24)
GFR, Estimated: 19 mL/min — ABNORMAL LOW (ref >60)
Glucose, Bld: 153 mg/dL — ABNORMAL HIGH (ref 70–99)
Phosphorus: 4.1 mg/dL (ref 2.5–4.6)
Potassium: 3.8 mmol/L (ref 3.5–5.1)
Sodium: 125 mmol/L — ABNORMAL LOW (ref 135–145)

## 2020-11-01 LAB — BODY FLUID CELL COUNT WITH DIFFERENTIAL
Lymphs, Fluid: 4 %
Monocyte-Macrophage-Serous Fluid: 3 % — ABNORMAL LOW (ref 50–90)
Neutrophil Count, Fluid: 93 % — ABNORMAL HIGH (ref 0–25)
Total Nucleated Cell Count, Fluid: 51 cu mm (ref 0–1000)

## 2020-11-01 LAB — CBC WITH DIFFERENTIAL/PLATELET
Abs Immature Granulocytes: 0.17 10*3/uL — ABNORMAL HIGH (ref 0.00–0.07)
Basophils Absolute: 0.1 10*3/uL (ref 0.0–0.1)
Basophils Relative: 0 %
Eosinophils Absolute: 0.2 10*3/uL (ref 0.0–0.5)
Eosinophils Relative: 1 %
HCT: 26.7 % — ABNORMAL LOW (ref 39.0–52.0)
Hemoglobin: 8.9 g/dL — ABNORMAL LOW (ref 13.0–17.0)
Immature Granulocytes: 1 %
Lymphocytes Relative: 4 %
Lymphs Abs: 0.5 10*3/uL — ABNORMAL LOW (ref 0.7–4.0)
MCH: 31.2 pg (ref 26.0–34.0)
MCHC: 33.3 g/dL (ref 30.0–36.0)
MCV: 93.7 fL (ref 80.0–100.0)
Monocytes Absolute: 1.1 10*3/uL — ABNORMAL HIGH (ref 0.1–1.0)
Monocytes Relative: 8 %
Neutro Abs: 12.1 10*3/uL — ABNORMAL HIGH (ref 1.7–7.7)
Neutrophils Relative %: 86 %
Platelets: 274 10*3/uL (ref 150–400)
RBC: 2.85 MIL/uL — ABNORMAL LOW (ref 4.22–5.81)
RDW: 13.4 % (ref 11.5–15.5)
WBC: 14.1 10*3/uL — ABNORMAL HIGH (ref 4.0–10.5)
nRBC: 0 % (ref 0.0–0.2)

## 2020-11-01 LAB — GLUCOSE, PLEURAL OR PERITONEAL FLUID: Glucose, Fluid: 120 mg/dL

## 2020-11-01 LAB — PROTEIN, PLEURAL OR PERITONEAL FLUID: Total protein, fluid: 3.3 g/dL

## 2020-11-01 LAB — GLUCOSE, CAPILLARY
Glucose-Capillary: 153 mg/dL — ABNORMAL HIGH (ref 70–99)
Glucose-Capillary: 161 mg/dL — ABNORMAL HIGH (ref 70–99)
Glucose-Capillary: 167 mg/dL — ABNORMAL HIGH (ref 70–99)
Glucose-Capillary: 152 mg/dL — ABNORMAL HIGH (ref 70–99)

## 2020-11-01 LAB — LACTATE DEHYDROGENASE, PLEURAL OR PERITONEAL FLUID: LD, Fluid: 454 U/L — ABNORMAL HIGH (ref 3–23)

## 2020-11-01 LAB — GRAM STAIN

## 2020-11-01 MED ORDER — AMOXICILLIN-POT CLAVULANATE 500-125 MG PO TABS
1.0000 | ORAL_TABLET | Freq: Two times a day (BID) | ORAL | Status: DC
  Administered 2020-11-01 – 2020-11-05 (×8): 500 mg via ORAL
  Filled 2020-11-01 (×10): qty 1

## 2020-11-01 MED ORDER — FUROSEMIDE 10 MG/ML IJ SOLN
80.0000 mg | Freq: Two times a day (BID) | INTRAMUSCULAR | Status: DC
  Administered 2020-11-02: 80 mg via INTRAVENOUS
  Filled 2020-11-01 (×2): qty 8

## 2020-11-01 MED ORDER — FAMOTIDINE 20 MG PO TABS
20.0000 mg | ORAL_TABLET | Freq: Every day | ORAL | Status: DC
  Administered 2020-11-01 – 2020-11-04 (×4): 20 mg via ORAL
  Filled 2020-11-01 (×4): qty 1

## 2020-11-01 MED ORDER — LIDOCAINE HCL (PF) 1 % IJ SOLN
INTRAMUSCULAR | Status: AC
  Filled 2020-11-01: qty 30

## 2020-11-01 NOTE — Progress Notes (Signed)
Pt off unit to IR

## 2020-11-01 NOTE — Procedures (Signed)
Ultrasound-guided diagnostic and therapeutic right thoracentesis performed yielding 950 cc of yellow fluid. No immediate complications. Follow-up chest x-ray pending.The fluid was sent to the lab for preordered studies. EBL< 2 cc. The collection was multiloculated and due to pt coughing/chest discomfort only the above amount of fluid could be aspirated today.

## 2020-11-01 NOTE — Progress Notes (Signed)
PT Cancellation Note  Patient Details Name: James Glover MRN: [id number] DOB: 11-Jun-1945   Cancelled Treatment:    Reason Eval/Treat Not Completed: Other (comment).  Gone to thoracentesis and now is too painful for therapy.  Will retry as time and pt allow.   Ramond Dial 11/01/2020, 4:14 PM  Mee Hives, PT MS Acute Rehab Dept. Number: [place] [facility] [place]

## 2020-11-01 NOTE — Progress Notes (Signed)
Referring Physician(s): Zang,P/Tsuei,M  Supervising Physician: Aletta Edouard  Patient Status:  [facility] - In-pt  Chief Complaint:  Cholecystitis, hepatic abscesses  Subjective: Patient sitting up in chair eating breakfast, denies new complaints; no respiratory distress or worsening abdominal pain but does report some dyspnea with exertion   Allergies: Ace inhibitors and Statins  Medications: Prior to Admission medications   Medication Sig Start Date End Date Taking? Authorizing Provider  traMADol (ULTRAM) 50 MG tablet Take 50 mg by mouth in the morning and at bedtime. 10/15/20  Yes [provider]  ACCU-CHEK GUIDE test strip USE AS DIRECTED 07/17/20   [name], MD  Accu-Chek Softclix Lancets lancets 1 each by Other route 2 (two) times daily. Use as instructed 03/29/19   [name], MD  amoxicillin-clavulanate (AUGMENTIN) 875-125 MG tablet Take 1 tablet by mouth every 12 (twelve) hours for 25 days. 10/20/20 11/14/20  Patrecia Pour, MD  aspirin 81 MG tablet Take 81 mg by mouth daily.    [provider]  famotidine (PEPCID) 40 MG tablet Take 40 mg by mouth at bedtime.    [provider]  fluorouracil (EFUDEX) 5 % cream  07/11/19   [provider]  hydrochlorothiazide (HYDRODIURIL) 25 MG tablet Take 1 tablet (25 mg total) by mouth daily. TAKE 1 TABLET(25 MG) BY MOUTH DAILY Patient taking differently: Take 25 mg by mouth daily. 08/21/20   [name], MD  insulin degludec (TRESIBA FLEXTOUCH) 100 UNIT/ML FlexTouch Pen ADMINISTER 18 UNITS UNDER THE SKIN AT BEDTIME Patient taking differently: Inject 18 Units into the skin at bedtime. 08/21/20   [name], MD  Insulin Pen Needle (BD PEN NEEDLE NANO U/F) 32G X 4 MM MISC 1 each by Does not apply route as needed. 08/27/20   [name], MD  losartan (COZAAR) 50 MG tablet TAKE 1 TABLET BY MOUTH DAILY Patient taking differently: Take 50 mg by mouth daily. 08/21/20   [name], MD  Multiple Vitamins-Minerals  (MULTIVITAMIN WITH MINERALS) tablet Take 1 tablet by mouth daily.    [provider]  Omega-3 Fatty Acids (FISH OIL) 1000 MG CAPS Take 1,000 mg by mouth 2 (two) times daily.    [provider]  omeprazole (PRILOSEC) 40 MG capsule TAKE 1 CAPSULE(40 MG) BY MOUTH IN THE MORNING AND AT BEDTIME Patient taking differently: Take 40 mg by mouth in the morning and at bedtime. 09/05/20   [name], MD  oxybutynin (DITROPAN) 5 MG tablet Take 5 mg by mouth 2 (two) times daily.  07/30/19   [provider]  pioglitazone (ACTOS) 45 MG tablet Take 1 tablet (45 mg total) by mouth daily. 08/21/20   [name], MD  saccharomyces boulardii (FLORASTOR) 250 MG capsule Take 1 capsule (250 mg total) by mouth 2 (two) times daily for 25 days. 10/20/20 11/14/20  Patrecia Pour, MD  saxagliptin HCl (ONGLYZA) 2.5 MG TABS tablet TAKE 1 TABLET(2.5 MG) BY MOUTH DAILY Patient taking differently: Take 2.5 mg by mouth daily. 08/21/20   [name], MD  simvastatin (ZOCOR) 20 MG tablet TAKE 1 TABLET(20 MG) BY MOUTH AT BEDTIME Patient taking differently: Take 20 mg by mouth at bedtime. 08/21/20   [name], MD  tamsulosin (FLOMAX) 0.4 MG CAPS capsule Take 1 capsule (0.4 mg total) by mouth 2 (two) times daily after a meal. For urinary urgency or weak stream. 02/08/19   Bruning, Ashlyn, PA-C  valACYclovir (VALTREX) 1000 MG tablet Take 2,000 mg by mouth as needed (fever blister). 10/08/14  [provider]     Vital Signs: BP (!) 148/59   Pulse 79   Temp 98.6 F (37 C) (Oral)   Resp 17   Ht '6\' 3"'$  (1.905 m)   Wt 295 lb 13.7 oz (134.2 kg)   SpO2 95%   BMI 36.98 kg/m   Physical Exam awake, alert.  Gallbladder drain and hepatic abscess drain intact, output from gallbladder drain 110 cc bile, right upper quadrant drain 20 cc of fairly clear looking fluid  Imaging: DG Chest 2 View  Result Date: 11/01/2020 CLINICAL DATA:  Pleural effusion EXAM: CHEST - 2 VIEW COMPARISON:  10/15/2020 chest radiograph.  FINDINGS: Stable cardiomediastinal silhouette with normal heart size. No pneumothorax. Large right pleural effusion is new from prior radiograph. No left pleural effusion. Near complete right lung atelectasis. Clear left lung. Pigtail catheter overlies the peripheral aspect of the right upper abdomen. IMPRESSION: Large right pleural effusion, new from prior radiograph. Near complete right lung atelectasis. Electronically Signed   By: Ilona Sorrel M.D.   On: 11/01/2020 10:00   CT ABDOMEN PELVIS W CONTRAST  Result Date: 10/30/2020 CLINICAL DATA:  Intra-abdominal abscesses, follow-up assessment EXAM: CT ABDOMEN AND PELVIS WITH CONTRAST TECHNIQUE: Multidetector CT imaging of the abdomen and pelvis was performed using the standard protocol following bolus administration of intravenous contrast. CONTRAST:  46m OMNIPAQUE IOHEXOL 350 MG/ML SOLN COMPARISON:  October 23, 2020. FINDINGS: Lower chest: Lung base assessment includes much of the chest showing enlarging RIGHT-sided effusion and volume loss in the RIGHT chest. There is a drain along the RIGHT subdiaphragmatic region placed into a perihepatic collection with diminished size of the collection compared to the prior exam. Three-vessel coronary artery disease. No substantial pericardial fluid. No acute bony process about the visualized bony thorax. Esophagus mildly patulous. Hepatobiliary: Decreased size of lenticular collections along the RIGHT hepatic margin. Decreased size of infrahepatic fluid following drain placement. Area of hypodensity in the RIGHT hepatic lobe superior to the gallbladder fossa now measures 4.2 x 2.5 cm compared to 5.8 x 5.3 cm, contiguous with the gallbladder. Cholecystostomy tube in place decompressing the gallbladder since the prior study. Pancreas: Atrophy of the pancreas. Spleen: Normal spleen. Adrenals/Urinary Tract: Adrenal glands are normal. Symmetric renal enhancement. No hydronephrosis. Urinary bladder with smooth contours. No  perinephric stranding. Stomach/Bowel: No acute gastrointestinal process. The appendix is normal. Colonic diverticulosis. Vascular/Lymphatic: Aortic atherosclerosis without sign of aneurysm. Smooth contour of the IVC. Patent abdominal vessels. There is no gastrohepatic or hepatoduodenal ligament lymphadenopathy. No retroperitoneal or mesenteric lymphadenopathy. No pelvic sidewall lymphadenopathy. Reproductive: Brachytherapy seeds present in the prostate. Other: No free intraperitoneal air. No substantial ascites with minimal fluid remaining in the gallbladder fossa and along the hepatic margin. No abdominal abscess elsewhere. Musculoskeletal: Spinal degenerative changes without acute or destructive bone process. IMPRESSION: Marked enlargement of RIGHT-sided pleural fluid collection with basilar and generalized volume loss throughout the RIGHT lung since the previous study. Superior perihepatic drain may traverse the now expanded pleural space. While pleural fluid in the RIGHT chest may be reactive, the possibility of chest involvement from abdominal infection is considered based on the above constellation of findings. Fluid sampling and/or drainage as warranted is suggested. Diminished size of intrahepatic pericholecystic abscess with decompression of the gallbladder and near complete resolution of perihepatic fluid. Aortic Atherosclerosis (ICD10-I70.0). These results will be called to the ordering clinician or representative by the Radiologist Assistant, and communication documented in the PACS or [employer] Electronically Signed   By: GJewel BaizeD.  On: 10/30/2020 15:08    Labs:  CBC: Recent Labs    10/29/20 0040 10/30/20 0327 10/31/20 0044 11/01/20 0021  WBC 13.0* 13.0* 13.5* 14.1*  HGB 9.4* 9.8* 9.4* 8.9*  HCT 28.0* 29.6* 28.0* 26.7*  PLT 317 312 282 274    COAGS: Recent Labs    10/15/20 2044  INR 1.4*  APTT 38*    BMP: Recent Labs    10/29/20 0040 10/30/20 0327  10/31/20 0044 11/01/20 0021  NA 130* 129* 126* 125*  K 3.8 3.8 3.7 3.8  CL 97* 94* 91* 90*  CO2 '25 24 24 25  '$ GLUCOSE 167* 133* 157* 153*  BUN 19 19 25* 31*  CALCIUM 7.9* 8.0* 7.9* 7.8*  CREATININE 1.63* 1.54* 2.44* 3.31*  GFRNONAA 44* 47* 27* 19*    LIVER FUNCTION TESTS: Recent Labs    10/24/20 0131 10/29/20 0040 10/30/20 0327 10/31/20 0044 11/01/20 0021  BILITOT 0.7 0.6 0.8 0.8  --   AST 34 '16 17 16  '$ --   ALT 41 '19 20 20  '$ --   ALKPHOS 69 54 58 58  --   PROT 5.0* 5.2* 5.2* 5.3*  --   ALBUMIN 1.8* 1.6* 1.6* 1.7* 1.7*    Assessment and Plan: Gangrenous cholecystitis and liver abscesses s/p percutaneous cholecystostomy and hepatic abscess drain placement x 2 in IR 10/24/20; infrahepatic drain removed on 9/1; cx neg; output from bladder drain 110 cc green bile, right upper quadrant drain 20 cc fairly clear looking fluid, WBC 14.1 slightly from 13.5, hemoglobin 8.9 down from 9.4; cont current tx; further plans as per TRH/CCS   Electronically Signed: D. Rowe Robert, PA-C 11/01/2020, 1:16 PM   I spent a total of 15 Minutes at the the patient's bedside AND on the patient's hospital floor or unit, greater than 50% of which was counseling/coordinating care for gallbladder drain and perihepatic fluid collection drain    Patient ID: James Glover, male   DOB: 1946-02-24, 75 y.o.   MRN: [id number]

## 2020-11-01 NOTE — Progress Notes (Signed)
Stitch material at JP drain connector piece for flushing is sticking out and poking pt in side. Placed gauze and tape to prevent skin damage.

## 2020-11-01 NOTE — Progress Notes (Signed)
PROGRESS NOTE    JACERE TORTORELLA  [id number] DOB: [birth year] DOA: 10/23/2020 PCP: [name], MD    Brief Narrative:  Mr. Fineran was admitted to the hospital the working diagnosis of acute/ subacute acalculus cholecystitis, complicated with liver abscess.  Now sp hepatic drains and cholecystostomy tube.   75 year old male past medical history for hypertension, type 2 diabetes mellitus, dyslipidemia, invasive prostate cancer status post brachytherapy now in remission, chronic kidney disease stage III.  Recent hospitalization 8/17-8/22/2022 for sepsis due to acute acalculous cholecystitis complicated by subcapsular liver abscess and S. Gallolyticus bacteremia.  He received intravenous antibiotic with Unasyn and discharged on Augmentin to complete 9/16.  Surgery recommended delay operative management.  He was discharged to a skilled nursing facility in stable condition. On the day of his hospitalization he had a follow-up right upper quadrant ultrasound/HIDA scan which showed gangrenous cholecystitis with a large liver abscess that prompted his surgeon to refer him to the hospital for IR cholecystostomy tube and liver abscess drainage. On his initial physical examination blood pressure 143/50, heart rate 81, respiratory rate 16, temperature 98.9, oxygen saturation 95%, his lungs are clear to auscultation bilaterally, heart S1-S2, present, rhythmic, his abdomen was tender in the right upper quadrant, no rebound or guarding, no lower extremity edema.   Sodium 133, potassium 3.8, chloride 99, bicarb 27, glucose 193, BUN 31, creatinine 1.67, white count 18.0, hemoglobin 10.3, hematocrit 31.2, platelets 242. SARS COVID-19 negative.   CT of the abdomen/pelvis with decreased pericholecystic edema suggesting slightly improved cholecystitis.  Complex intraparenchymal pericholecystic liver lesion likely abscess.  Multiple capsular-based fluid collections.   Patient was placed on IV antibiotic therapy  with good toleration.   08/26 percutaneous cholecystostomy tube, infrahepatic abscess drainage placement, superior perihepatic abscess drainage catheter and intrahepatic abscess aspiration.    Clinically patient has been improving after drains placed.    Plan to follow up as outpatient with Dr Constance Haw for eventual cholecystectomy.   Follow up CT abdomen and pelvis on 09/01 with right pleural effusion. Diminished intrahepatic pericholecystic abscess with decompression of the gallbladder and near complete resolution of the perihepatic fluid.  IR removed the infra-hepatic drain.   Positive right pleural effusion, and worsening renal function.   Assessment & Plan:   Principal Problem:   Liver abscess Active Problems:   Essential hypertension, benign   Dyslipidemia   Uncontrolled type II diabetes mellitus with nephropathy (HCC)   Obesity (BMI 30-39.9)   HTN (hypertension)   Type II diabetes mellitus with nephropathy (HCC)   CKD (chronic kidney disease) stage 3, GFR 30-59 ml/min (HCC)   Prostate cancer (HCC)   Gangrenous cholecystitis   CKD stage 3 due to type 2 diabetes mellitus (HCC)   Cholecystitis   Pressure injury of skin     Acalculous cholecystis complicated with liver abscess, complicated with severe sepsis (end-organ failure AKI), present on admission.  SP 2 hepatic drains, cholecystostomy tube and intrahepatic abscess drain with good toleration.  Follow up CT with improvement in liver abscess, sub hepatic drain has been removed.  Today wbc is 14,1 and patient has remained afebrile. Cultures with no growth  Plan to continue antibiotic therapy with Augmentin, will likely need a total of 10 days of therapy.  Follow up with surgery as outpatient for cholecystectomy.    2. AKI on CKD stage 3a/ hyponatremia/ hypokalemia  Worsening edema, positive right pleural effusion and worsening hyponatremia.  Urine output over last 24 hrs 1050. Per CT no hydronephrosis or obstructive  uropathy.  Patient clinically with hypervolemic hyponatremia. Echocardiogram from 09/2020 with preserved LV systolic function, trivial pericardial effusion.  Today renal function with serum cr at 3,31 with K at 3,8 and serum bicarbonate at 25.   Plan for repeat furosemide, now IV 80 mg q12 x 2 doses and follow up renal function and electrolytes.    3. New right pleural affusion. Suspected transudate.  Patient with clinically moderate pleural effusion on the right, suspected transudate. Plan to consult IR for possible US guided thoracentesis.   3. T2DM/ dyslipidemia  Fasting glucose today is 153, continue with insulin sliding scale for glucose cover and monitoring. Basal insulin with 12 units qhs.  Continue holding on linagliptin/ pioglitazone    Continue with simvastatin   4. Hx of prostate CA on remission. No signs clinical signs of urinary retention.   5. Stage 2 left buttock pressure ulcer. Present on admission. Continue with skin care per protocol., Today patient out of bed to chair.    6. HTN. Blood pressure control weith amlodipine and hctz f   7. Obesity class 2. Calculated BMI is 35.25, follow as outpatient.     8. Chronic multifactorial anemia.  Cell count has been stable, no indication for PRBC transfusion     Patient continue to be at high risk for worsening renal failure   Status is: Inpatient  Remains inpatient appropriate because:Inpatient level of care appropriate due to severity of illness  Dispo: The patient is from: Home              Anticipated d/c is to: SNF              Patient currently is not medically stable to d/c.   Difficult to place patient No   DVT prophylaxis: Enoxaparin   Code Status:    full  Family Communication:  I spoke with patient's wife and son at the bedside, we talked in detail about patient's condition, plan of care and prognosis and all questions were addressed.      Nutrition Status: Nutrition Problem: Inadequate oral  intake Etiology: acute illness Signs/Symptoms: per patient/family report       Skin Documentation: Pressure Injury 10/23/20 Buttocks Left Stage 2 -  Partial thickness loss of dermis presenting as a shallow open injury with a red, pink wound bed without slough. (Active)  10/23/20 1950  Location: Buttocks  Location Orientation: Left  Staging: Stage 2 -  Partial thickness loss of dermis presenting as a shallow open injury with a red, pink wound bed without slough.  Wound Description (Comments):   Present on Admission: Yes     Consultants:  Surgery  IR  Procedures:  Liver abscess drains and cholecystostomy drain   Antimicrobials:  Augmentin     Subjective: Patient with no abdominal pain, no nausea or vomiting, positive swelling of the lower extremities and dyspnea on exertion   Objective: Vitals:   10/31/20 2009 10/31/20 2120 11/01/20 0409 11/01/20 0740  BP: (!) 177/58 (!) 156/61 (!) 143/91 (!) 166/58  Pulse: 90 88 87 83  Resp: '20  20 17  '$ Temp: 98.7 F (37.1 C)  99 F (37.2 C) 98.6 F (37 C)  TempSrc: Oral  Oral Oral  SpO2: 93%  93% 93%  Weight:      Height:        Intake/Output Summary (Last 24 hours) at 11/01/2020 1133 Last data filed at 11/01/2020 0414 Gross per 24 hour  Intake 601 ml  Output 930 ml  Net -329 ml  Filed Weights   10/27/20 1335 10/30/20 1216  Weight: 133.8 kg 134.2 kg    Examination:   General: Not in pain but, deconditioned  Neurology: Awake and alert, non focal  E ENT: no pallor, no icterus, oral mucosa moist Cardiovascular: No JVD. S1-S2 present, rhythmic, no gallops, rubs, or murmurs. +++ pitting bilateral lower extremity edema. Pulmonary: positive breath sounds bilaterally, on the left with no wheezing, rhonchi or rales. Right side 2/3 lower hemithorax with decreased breaths sounds and dull to percussion.  Gastrointestinal. Abdomen soft and non tender. Drain and cholecystostomy tube in place.  Skin. No rashes Musculoskeletal: no  joint deformities     Data Reviewed: I have personally reviewed following labs and imaging studies  CBC: Recent Labs  Lab 10/26/20 0758 10/27/20 0809 10/28/20 0733 10/28/20 2040 10/29/20 0040 10/30/20 0327 10/31/20 0044 11/01/20 0021  WBC 16.4*   < > 13.9*  --  13.0* 13.0* 13.5* 14.1*  NEUTROABS 14.2*  --   --   --   --   --  11.5* 12.1*  HGB 9.5*   < > 9.6* 9.5* 9.4* 9.8* 9.4* 8.9*  HCT 28.5*   < > 28.6* 28.6* 28.0* 29.6* 28.0* 26.7*  MCV 95.6   < > 94.7  --  95.6 95.2 94.0 93.7  PLT 333   < > 333  --  317 312 282 274   < > = values in this interval not displayed.   Basic Metabolic Panel: Recent Labs  Lab 10/28/20 0733 10/29/20 0040 10/30/20 0327 10/31/20 0044 11/01/20 0021  NA 130* 130* 129* 126* 125*  K 4.0 3.8 3.8 3.7 3.8  CL 97* 97* 94* 91* 90*  CO2 '24 25 24 24 25  '$ GLUCOSE 191* 167* 133* 157* 153*  BUN '21 19 19 '$ 25* 31*  CREATININE 1.68* 1.63* 1.54* 2.44* 3.31*  CALCIUM 7.8* 7.9* 8.0* 7.9* 7.8*  MG  --  1.8  --   --   --   PHOS  --  3.2  --   --  4.1   GFR: Estimated Creatinine Clearance: 28.9 mL/min (A) (by C-G formula based on SCr of 3.31 mg/dL (H)). Liver Function Tests: Recent Labs  Lab 10/29/20 0040 10/30/20 0327 10/31/20 0044 11/01/20 0021  AST '16 17 16  '$ --   ALT '19 20 20  '$ --   ALKPHOS 54 58 58  --   BILITOT 0.6 0.8 0.8  --   PROT 5.2* 5.2* 5.3*  --   ALBUMIN 1.6* 1.6* 1.7* 1.7*   No results for input(s): LIPASE, AMYLASE in the last 168 hours. No results for input(s): AMMONIA in the last 168 hours. Coagulation Profile: No results for input(s): INR, PROTIME in the last 168 hours. Cardiac Enzymes: No results for input(s): CKTOTAL, CKMB, CKMBINDEX, TROPONINI in the last 168 hours. BNP (last 3 results) No results for input(s): PROBNP in the last 8760 hours. HbA1C: No results for input(s): HGBA1C in the last 72 hours. CBG: Recent Labs  Lab 10/31/20 0808 10/31/20 1210 10/31/20 1650 10/31/20 2028 11/01/20 0739  GLUCAP 158* 178* 195*  160* 153*   Lipid Profile: No results for input(s): CHOL, HDL, LDLCALC, TRIG, CHOLHDL, LDLDIRECT in the last 72 hours. Thyroid Function Tests: No results for input(s): TSH, T4TOTAL, FREET4, T3FREE, THYROIDAB in the last 72 hours. Anemia Panel: No results for input(s): VITAMINB12, FOLATE, FERRITIN, TIBC, IRON, RETICCTPCT in the last 72 hours.    Radiology Studies: I have reviewed all of the imaging during this hospital visit personally  Scheduled Meds:  amLODipine  10 mg Oral Daily   amoxicillin-clavulanate  1 tablet Oral Q12H   vitamin C  250 mg Oral BID   aspirin EC  81 mg Oral Daily   enoxaparin (LOVENOX) injection  65 mg Subcutaneous Daily   famotidine  40 mg Oral QHS   insulin aspart  0-15 Units Subcutaneous TID WC   insulin glargine-yfgn  12 Units Subcutaneous QHS   multivitamin with minerals  1 tablet Oral Daily   oxybutynin  5 mg Oral BID   pantoprazole  40 mg Oral Daily   Ensure Max Protein  11 oz Oral BID   saccharomyces boulardii  250 mg Oral BID   simvastatin  20 mg Oral QHS   sodium chloride flush  5 mL Intracatheter Q8H   tamsulosin  0.4 mg Oral BID PC   Continuous Infusions:   LOS: 9 days        Milianna Ericsson Gerome Apley, MD

## 2020-11-01 NOTE — Progress Notes (Signed)
Pt back on unit.

## 2020-11-02 LAB — URINALYSIS, ROUTINE W REFLEX MICROSCOPIC
Bilirubin Urine: NEGATIVE
Glucose, UA: NEGATIVE mg/dL
Hgb urine dipstick: NEGATIVE
Ketones, ur: NEGATIVE mg/dL
Leukocytes,Ua: NEGATIVE
Nitrite: NEGATIVE
Protein, ur: NEGATIVE mg/dL
Specific Gravity, Urine: 1.006 (ref 1.005–1.030)
pH: 5 (ref 5.0–8.0)

## 2020-11-02 LAB — PH, BODY FLUID: pH, Body Fluid: 7.3

## 2020-11-02 LAB — BASIC METABOLIC PANEL
Anion gap: 11 (ref 5–15)
BUN: 33 mg/dL — ABNORMAL HIGH (ref 8–23)
CO2: 23 mmol/L (ref 22–32)
Calcium: 7.9 mg/dL — ABNORMAL LOW (ref 8.9–10.3)
Chloride: 93 mmol/L — ABNORMAL LOW (ref 98–111)
Creatinine, Ser: 3.49 mg/dL — ABNORMAL HIGH (ref 0.61–1.24)
GFR, Estimated: 18 mL/min — ABNORMAL LOW (ref >60)
Glucose, Bld: 169 mg/dL — ABNORMAL HIGH (ref 70–99)
Potassium: 3.8 mmol/L (ref 3.5–5.1)
Sodium: 127 mmol/L — ABNORMAL LOW (ref 135–145)

## 2020-11-02 LAB — CBC
HCT: 27.1 % — ABNORMAL LOW (ref 39.0–52.0)
Hemoglobin: 9.3 g/dL — ABNORMAL LOW (ref 13.0–17.0)
MCH: 32 pg (ref 26.0–34.0)
MCHC: 34.3 g/dL (ref 30.0–36.0)
MCV: 93.1 fL (ref 80.0–100.0)
Platelets: 255 10*3/uL (ref 150–400)
RBC: 2.91 MIL/uL — ABNORMAL LOW (ref 4.22–5.81)
RDW: 13.2 % (ref 11.5–15.5)
WBC: 17.3 10*3/uL — ABNORMAL HIGH (ref 4.0–10.5)
nRBC: 0 % (ref 0.0–0.2)

## 2020-11-02 LAB — LACTATE DEHYDROGENASE: LDH: 166 U/L (ref 98–192)

## 2020-11-02 LAB — GLUCOSE, CAPILLARY
Glucose-Capillary: 191 mg/dL — ABNORMAL HIGH (ref 70–99)
Glucose-Capillary: 193 mg/dL — ABNORMAL HIGH (ref 70–99)
Glucose-Capillary: 203 mg/dL — ABNORMAL HIGH (ref 70–99)
Glucose-Capillary: 140 mg/dL — ABNORMAL HIGH (ref 70–99)

## 2020-11-02 MED ORDER — ENOXAPARIN SODIUM 40 MG/0.4ML IJ SOSY
40.0000 mg | PREFILLED_SYRINGE | Freq: Every day | INTRAMUSCULAR | Status: DC
  Administered 2020-11-03: 40 mg via SUBCUTANEOUS
  Filled 2020-11-02: qty 0.4

## 2020-11-02 MED ORDER — FUROSEMIDE 10 MG/ML IJ SOLN
80.0000 mg | Freq: Once | INTRAMUSCULAR | Status: AC
  Administered 2020-11-02: 80 mg via INTRAVENOUS
  Filled 2020-11-02: qty 8

## 2020-11-02 NOTE — Progress Notes (Signed)
Pt's wife completed dressing change and drain care with minimal assistance. She had a friend here watching as well.  Pt tolerated well.  Wife took home bag of supplies for dressing change/drain care. Did not include NS flushes in case JP drain is removed prior to d/c. JP drain had 10 mL of clear, thin liquid.

## 2020-11-02 NOTE — Progress Notes (Signed)
Physical Therapy Treatment Patient Details Name: James Glover MRN: [id number] DOB: 1945-11-02 Today's Date: 11/02/2020    History of Present Illness The pt is a 75 yo male presenting 8/25 from SNF due to ultrasound and HIDA scan concerning for gangrenous cholecystitis and larger liver abscess. Pt reccently hospitalized 8/17-8/22 due to acute cholecystitis complicated by subcapsular live abscess and infection and d/c to SNF for short term rehab. PMH includes: aortic atherosclerosis, CKD III, HTN, DM II, and prostate cancer.    PT Comments    Pt is making great progress with mobility independence, safety, and tolerance. Pt was able to perform bed mobility with bed flat to simulate home with minA. He was able to perform all transfers from various surfaces and ambulate x3 bouts of ~30-70 ft distances with a RW all at a supervision level today. Pt displayed no LOB and improved safety awareness. He was able to maintain his SpO2 >/= 92% on RA throughout the entire session. Educated pt on energy conservation techniques and increasing endurance through more frequent short bouts of activity. Educated pt and wife on guarding with stairs. Will continue to follow acutely. Recommend d/c home with HHPT as pt desires to return home, has displayed a significant functional improvement, and his wife can provide the level of care necessary.     Follow Up Recommendations  Supervision for mobility/OOB;Home health PT     Equipment Recommendations  Rolling walker with 5" wheels;Other (comment);Wheelchair (measurements PT);Wheelchair cushion (measurements PT) (tub bench)    Recommendations for Other Services       Precautions / Restrictions Precautions Precautions: Fall;Other (comment) Precaution Comments: chole tube, x2 drains, watch O2 Restrictions Weight Bearing Restrictions: No    Mobility  Bed Mobility Overal bed mobility: Needs Assistance Bed Mobility: Supine to Sit     Supine to sit: Min assist      General bed mobility comments: Bed flat and rails down to simulate home, pt requesting hands of PT to assist with pulling self to sit EOB. PT provided hands as pt was beginning to get agitated by being cued to try it like he would at home. Educated wife how to assist pt supine <> sit.    Transfers Overall transfer level: Needs assistance Equipment used: Rolling walker (2 wheeled) Transfers: Sit to/from Stand Sit to Stand: Supervision         General transfer comment: Supervision for safety with extra time and multiple trunk rocking to gain momentum to stand from various surfaces (short bench, chair, EOB). No LOB.  Ambulation/Gait Ambulation/Gait assistance: Supervision Gait Distance (Feet): 70 Feet (x3 bouts of ~35 ft > ~70 ft > ~30 ft) Assistive device: Rolling walker (2 wheeled) Gait Pattern/deviations: Step-through pattern;Decreased stride length;Trunk flexed Gait velocity: decreased Gait velocity interpretation: <1.8 ft/sec, indicate of risk for recurrent falls General Gait Details: Pt with tendency to push RW distal and thus flex trunk, needs repeated reminders to remain proximal but pt states "it's too narrow for me". No LOB, supervision for safety. Pt fatigues and looks for places to sit when needed.   Stairs             Wheelchair Mobility    Modified Rankin (Stroke Patients Only)       Balance Overall balance assessment: Needs assistance Sitting-balance support: No upper extremity supported Sitting balance-[name] Scale: Good Sitting balance - Comments: seated EOB   Standing balance support: Bilateral upper extremity supported;During functional activity Standing balance-[name] Scale: Poor Standing balance comment: UE support for mobility.  Cognition Arousal/Alertness: Awake/alert Behavior During Therapy: WFL for tasks assessed/performed Overall Cognitive Status: Impaired/Different from baseline Area of Impairment:  Following commands;Memory;Safety/judgement;Awareness;Problem solving                     Memory: Decreased recall of precautions;Decreased short-term memory Following Commands: Follows one step commands consistently;Follows one step commands with increased time Safety/Judgement: Decreased awareness of safety;Decreased awareness of deficits Awareness: Emergent Problem Solving: Slow processing;Difficulty sequencing;Requires verbal cues;Requires tactile cues General Comments: Pt with improved awareness, indicating when he needed to rest prior to just prematurely sitting. Pt needs min cues for bed mobility sequencing. Pt can be a bit resistive to education/encouragement on more frequent bouts of short duration activity to improve his endurance. extensive education provided on need to improve regular mobility to reduce functional decline      Exercises      General Comments General comments (skin integrity, edema, etc.): SpO2 >/= 92% on RA with all mobility, monitored throughout session; educated pt on energy conservation and frequent short bouts of activity      Pertinent Vitals/Pain Pain Assessment: Faces Faces Pain Scale: Hurts a little bit Pain Location: generalized discomfort Pain Descriptors / Indicators: Grimacing;Discomfort Pain Intervention(s): Limited activity within patient's tolerance;Monitored during session;Repositioned    Home Living                      Prior Function            PT Goals (current goals can now be found in the care plan section) Acute Rehab PT Goals Patient Stated Goal: to go home PT Goal Formulation: With patient/family Time For Goal Achievement: 11/07/20 Potential to Achieve Goals: Fair Progress towards PT goals: Progressing toward goals    Frequency    Min 3X/week      PT Plan Current plan remains appropriate    Co-evaluation              AM-PAC PT "6 Clicks" Mobility   Outcome Measure  Help needed turning from  your back to your side while in a flat bed without using bedrails?: A Little Help needed moving from lying on your back to sitting on the side of a flat bed without using bedrails?: A Little Help needed moving to and from a bed to a chair (including a wheelchair)?: A Little Help needed standing up from a chair using your arms (e.g., wheelchair or bedside chair)?: A Little Help needed to walk in hospital room?: A Little Help needed climbing 3-5 steps with a railing? : A Little 6 Click Score: 18    End of Session Equipment Utilized During Treatment: Gait belt Activity Tolerance: Patient limited by fatigue;Patient tolerated treatment well Patient left: in chair;with call bell/phone within reach;with family/visitor present   PT Visit Diagnosis: Unsteadiness on feet (R26.81);Other abnormalities of gait and mobility (R26.89);Muscle weakness (generalized) (M62.81);Difficulty in walking, not elsewhere classified (R26.2)     Time: [id number] PT Time Calculation (min) (ACUTE ONLY): 22 min  Charges:  $Therapeutic Activity: 8-22 mins                     Moishe Spice, PT, DPT Acute Rehabilitation Services  Pager: [phone] Office: [place] 11/02/2020, 6:44 PM

## 2020-11-02 NOTE — Consult Note (Signed)
Nephrology Consult   Assessment/Recommendations:   Non-Oliguric AKI: Likely secondary to CIN + ATN from sepsis, last contrast study on 9/1 -baseline Cr around 1.6-1.9? Seems that his rate of Cr rise is slowing down, urine output is adequate which is reassuring -check UA w/ microscopy -CT on 9/1 did not reveal any renal obstruction -Agree with holding ARB+HCTZ -Continue to monitor daily Cr, Dose meds for GFR<15 -Monitor Daily I/Os, Daily weight  -Maintain MAP>65 for optimal renal perfusion.  -avoid further nephrotoxins including NSAIDS, Morphine.  Unless absolutely necessary, avoid CT with contrast and/or MRI with gadolinium.     Acalculous cholecystitis complicated with liver abscess and severe sepsis -Status post hepatic drains, cholecystostomy tube and intrahepatic abscess drain.  On Augmentin  Right pleural effusion, status post thoracentesis 9/3  Edema: -Agree with lasix for today, moving forward would favor diuresis PRN in efforts to maintain euvolemia especially as he has been having diarrhea  Hypertension: ARB and HCTZ on hold, will see if there is any improvement in blood pressure with addition of Lasix  Hyponatremia: -likely secondary to hypervolemia and AKI, receiving lasix, will monitor, consider fluid restriction 1.5L/day  Normocytic anemia: -Transfuse for Hgb<7 g/dL -likely related to chronic disease/illness, monitor  Diabetes Mellitus Type 2 with Hyperglycemia -Per primary  [facility] 11/02/2020 10:14 AM   _____________________________________________________________________________________   History of Present Illness: James Glover is a/an 75 y.o. male with a past medical history of CKD, hypertension, DM 2, hyperlipidemia, invasive prostate cancer status post brachytherapy (in remission) who was admitted for a calculus cholecystitis complicated with liver abscess.  He is now status post hepatic drains and cholecystostomy tube.  Is  planning for an eventual cholecystectomy.  He did have a follow-up CT abdomen pelvis on 9/1 with contrast which revealed a right pleural effusion--s/p thora 9/3. Wife at bedside today. Patient does report diarrhea intermittently. Feels like he can take a deeper breath since his thora. Denies any n/v, dysgeusia, loss of appetite, worsening SOB, dizziness, intractable hiccups/pruritis, brain fog, chest pain. Follows with Dr. Marval Regal at our office.   Medications:  Current Facility-Administered Medications  Medication Dose Route Frequency Provider Last Rate Last Admin   amLODipine (NORVASC) tablet 10 mg  10 mg Oral Daily Nita Sells, MD   10 mg at 11/02/20 0810   amoxicillin-clavulanate (AUGMENTIN) 500-125 MG per tablet 500 mg  1 tablet Oral Q12H Arrien, Jimmy Picket, MD   500 mg at 11/02/20 [id number]   ascorbic acid (VITAMIN C) tablet 250 mg  250 mg Oral BID Tawni Millers, MD   250 mg at 11/02/20 0810   aspirin EC tablet 81 mg  81 mg Oral Daily Wynetta Fines T, MD   81 mg at 11/02/20 0809   enoxaparin (LOVENOX) injection 65 mg  65 mg Subcutaneous Daily Karren Cobble, RPH   65 mg at 11/02/20 0810   famotidine (PEPCID) tablet 20 mg  20 mg Oral QHS Tawni Millers, MD   20 mg at 11/01/20 2203   furosemide (LASIX) injection 80 mg  80 mg Intravenous Q12H Tawni Millers, MD   80 mg at 11/02/20 0059   hydrALAZINE (APRESOLINE) tablet 25 mg  25 mg Oral Q6H PRN Wynetta Fines T, MD   25 mg at 10/31/20 2017   insulin aspart (novoLOG) injection 0-15 Units  0-15 Units Subcutaneous TID WC Lequita Halt, MD   3 Units at 11/02/20 0843   insulin glargine-yfgn (SEMGLEE) injection 12 Units  12 Units Subcutaneous  Kandice Robinsons, MD   12 Units at 11/01/20 2203   loperamide (IMODIUM) capsule 2 mg  2 mg Oral BID PRN Nita Sells, MD   2 mg at 11/02/20 0809   multivitamin with minerals tablet 1 tablet  1 tablet Oral Daily Arrien, Jimmy Picket, MD   1 tablet at 11/02/20  0809   ondansetron (ZOFRAN) tablet 4 mg  4 mg Oral Q6H PRN Lequita Halt, MD       Or   ondansetron [facility]) injection 4 mg  4 mg Intravenous Q6H PRN Lequita Halt, MD       oxybutynin (DITROPAN) tablet 5 mg  5 mg Oral BID Wynetta Fines T, MD   5 mg at 11/02/20 0810   pantoprazole (PROTONIX) EC tablet 40 mg  40 mg Oral Daily Wynetta Fines T, MD   40 mg at 11/02/20 0809   protein supplement (ENSURE MAX) liquid  11 oz Oral BID Tawni Millers, MD   11 oz at 11/01/20 2204   saccharomyces boulardii (FLORASTOR) capsule 250 mg  250 mg Oral BID Wynetta Fines T, MD   250 mg at 11/02/20 0810   simvastatin (ZOCOR) tablet 20 mg  20 mg Oral QHS Wynetta Fines T, MD   20 mg at 11/01/20 2203   sodium chloride flush (NS) 0.9 % injection 5 mL  5 mL Intracatheter Q8H Aletta Edouard, MD   5 mL at 11/02/20 0523   tamsulosin (FLOMAX) capsule 0.4 mg  0.4 mg Oral BID PC Wynetta Fines T, MD   0.4 mg at 11/02/20 0810   traMADol (ULTRAM) tablet 50 mg  50 mg Oral Q12H PRN Lequita Halt, MD   50 mg at 10/28/20 1008     ALLERGIES Ace inhibitors and Statins  MEDICAL HISTORY Past Medical History:  Diagnosis Date   CKD (chronic kidney disease), stage III [facility])    nephrologist-- [place]--- per lov note 03-08-2018 in epic, stable (baseline Cr 1.5 - 2)   Dyslipidemia    Erectile dysfunction    First degree heart block    GERD (gastroesophageal reflux disease)    Hemorrhoids    internal and external   History of nuclear stress test    05-26-2009 (by dr Rollene Fare due to HTN and DM2)--- normal study w/ no ischemia,  normal LV function and wall motion , ef 70%   Hyperplasia of prostate with lower urinary tract symptoms (LUTS)    Hypertension    Hypogonadism male    Iron deficiency    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 Tresa Moore   Prostate cancer [facility]) urologist-  dr t. Tresa Moore  oncologsit-  dr Tammi Klippel   dx 05-30-2018-- Stage T1c, Gleason 4+5, High Risk   Renal cyst, left    followed by  dr t. Tresa Moore--  chronic noncomplex   Type 2 diabetes mellitus ([place])    followed by pcp   Wears partial dentures    upper     SOCIAL HISTORY Social History   Socioeconomic History   Marital status: Married    Spouse name: Not on file   Number of children: 0   Years of education: Not on file   Highest education level: Not on file  Occupational History   Occupation: police officer--retired    Employer: [employer]  Tobacco Use   Smoking status: Never   Smokeless tobacco: Never  Vaping Use   Vaping Use: Never used  Substance and  Sexual Activity   Alcohol use: Yes    Alcohol/week: 3.0 standard drinks    Types: 3 Glasses of wine per week    Comment: 3 glasses of wine per week   Drug use: Never   Sexual activity: Yes  Other Topics Concern   Not on file  Social History Narrative   Retired 06/2011.  Lives with wife, 1 cat.   Has a place on [place], spends 1-2 weeks/month there   Has 9 siblings      Updated 01/2020   Social Determinants of Health   Financial Resource Strain: Not on file  Food Insecurity: Not on file  Transportation Needs: Not on file  Physical Activity: Not on file  Stress: Not on file  Social Connections: Not on file  Intimate Partner Violence: Not on file     FAMILY HISTORY Family History  Problem Relation Age of Onset   Lymphoma Father    Cancer Father        lymphoma   Hypertension Father    Lymphoma Brother    Cancer Brother        lymphoma   Dementia Mother    Diabetes Mother    Diabetes Sister    Cancer Brother        skin cancer   Diabetes Brother    Lymphoma Paternal Grandfather    Cancer Paternal Grandfather        lymphoma   Diabetes Brother    Kidney disease Brother    COPD Brother    Heart disease Neg Hx      Review of Systems: 12 systems reviewed Otherwise as per HPI, all other systems reviewed and negative  Physical Exam: Vitals:   11/02/20 0521 11/02/20 0806  BP: (!) 160/57 (!) 144/64  Pulse: 90 90   Resp: 18 17  Temp: 97.8 F (36.6 C) 97.8 F (36.6 C)  SpO2: 95% 94%   Total I/O In: -  Out: 425 [Urine:425]  Intake/Output Summary (Last 24 hours) at 11/02/2020 1014 Last data filed at 11/02/2020 [id number] Gross per 24 hour  Intake 10 ml  Output 2311 ml  Net -2301 ml   General: well-appearing, no acute distress HEENT: anicteric sclera, oropharynx clear without lesions CV: regular rate, normal rhythm, no murmurs, no gallops, no rubs Lungs: decreased breath sounds right>left, unlabored Abd: soft, non-tender, non-distended Skin: no visible lesions or rashes Psych: alert, engaged, appropriate mood and affect Musculoskeletal: 2+ pitting edema bl Neuro: normal speech, no gross focal deficits   Test Results Reviewed Lab Results  Component Value Date   NA 127 (L) 11/02/2020   K 3.8 11/02/2020   CL 93 (L) 11/02/2020   CO2 23 11/02/2020   BUN 33 (H) 11/02/2020   CREATININE 3.49 (H) 11/02/2020   CALCIUM 7.9 (L) 11/02/2020   ALBUMIN 1.7 (L) 11/01/2020   PHOS 4.1 11/01/2020     I have reviewed all relevant outside healthcare records related to the patient's kidney injury.

## 2020-11-02 NOTE — Progress Notes (Signed)
PROGRESS NOTE   James Glover  [id number] DOB: October 24, 1945 DOA: 10/23/2020 PCP: [name], MD  Brief Narrative:  75 year old white male CKD stage III, prostate CA in remission status post brachytherapy 2020 DM TY 2 HTN HLD reflux Recent hospitalization 8/17--8/22 with acute cholecystitis Rx Unasyn-->Augmentin to complete 9/16 Blood cultures at that time grew Streptococcus [place] lytic [place] and patient was converted to oral Augmentin as repeat cultures 8/19  neg-- patient transferred to skilled facility  He developed abdominal RUQ pain after diet resumed-he went to follow-up with general surgery office and was found to have enlarged liver abscess on CT with complex intraparenchymal pericholecystic abscesses  General surgery recommended drainage  8/26 perc cholecystotomy tubes placed in for a hepatic abscess, superior perihepatic abscess 8/31 repeat CT abdomen -drain #2 removed    Hospital-Problem based course  Sepsis on admission secondary to acalculous cholecystitis with liver abscess complicated by severe sepsis with endorgan damage on admission Hepatic drains x 1 + cholecystotomy tube CT 9/1 reviewed-overall decreasing size of several areas of Abscess.  Drain #2 pulled by IR 9/1--IR recommending flushing abscess c 5cc NS, dressing changes q2-3 d,  White count upto 17 range--IV zosyn (started 8/25)-->Augmentin on 9/1--given ongoing abscess collection would extend duration of antibiotics Eventual outpatient cholecystectomy per Dr. Bridges-Cholecystotomy to stay in at this time Pain control tramadol 50 every 12 as needed moderate pain-discontinue IV Dilaudid ATN likely secondary to contrast-induced nephropathy causing AKI superimposed CKD 3 AA  rising creatinine--could be combination of IV contrast for CT scan and underlying sepsis Bladder scan is negative Await FE urea Renal has seen--ordering UA--hopeful for expectant recovery Defer lasix dosing to them Probable exudative pleural  effusion secondary to adjacent abscesses  Thoracentesis performed 9/3 yielded 900 cc: Results 51 nucleated cells, LDH 454 Right chest tube remains in place Symptomatic management, repeat chest x-ray in 24 to 48 hours to see if reaccumulate Thoracentesis cultures pending from 9/3--already on Augmentin Diarrhea not otherwise specified Hold testing for C. difficile --if persistent would however test as patient has been on antibiotics for a while DM TY 2 with dyslipidemia Eating 100% meals-CBGs ranging 1 69-1 91 Continue 12 units of long-acting insulin with moderate sliding scale HTN uncontrolled Increased amlodipine to 10, hydralazine 25 every 6 as needed,  HCTZ 25 held Stage II left buttock pressure ulcer from prior to admission Encourage mobility Prostate cancer status post brachytherapy 2020 Continue outpatient evaluation management and continue Ditropan 5 twice daily, Flomax 0.4 twice daily  DVT prophylaxis: Lovenox Code Status: Full Family Communication: Discussed with wife at the bedside [phone] Disposition:  Status is: Inpatient  Remains inpatient appropriate because:Persistent severe electrolyte disturbances, IV treatments appropriate due to intensity of illness or inability to take PO, and Inpatient level of care appropriate due to severity of illness  Dispo: The patient is from: Home              Anticipated d/c is to:  Home with home health              Patient currently is not medically stable to d/c.   Difficult to place patient No  Consultants:  General surgery Interventional radiology  Procedures:   8/26 10 French cholecystotomy 8/26 infrahepatic collection 10 French drain + suction bulb 8/26 severe perihepatic 10 French drain with suction bulb 8/26 intrahepatic collections noted GB fossa drained with 18 G needle CT repeat 9/1  Marked enlargement of RIGHT-sided pleural fluid collection with basilar and generalized volume loss throughout the  RIGHT lung  since the previous study. Superior perihepatic drain may traverse the now expanded pleural space. While pleural fluid in the RIGHT chest may be reactive, the possibility of chest involvement from abdominal infection is considered based on the above constellation of findings. Fluid sampling and/or drainage as warranted is suggested.   Diminished size of intrahepatic pericholecystic abscess with decompression of the gallbladder and near complete resolution of perihepatic fluid.  Antimicrobials: Zosyn-->augmentin   Subjective:  Awake coherent a little dejected, no fever, n/v ate good bfast this am In nad at this time No focal deficit Multiple rounds loose pudding like stool No cp Breathing good after thoracentesis    Objective: Vitals:   11/01/20 1735 11/01/20 2109 11/02/20 0521 11/02/20 0806  BP: (!) 158/63 (!) 178/44 (!) 160/57 (!) 144/64  Pulse: 89 85 90 90  Resp:  '18 18 17  '$ Temp:  98.6 F (37 C) 97.8 F (36.6 C) 97.8 F (36.6 C)  TempSrc:  Oral Oral Oral  SpO2: 90% 91% 95% 94%  Weight:      Height:        Intake/Output Summary (Last 24 hours) at 11/02/2020 1133 Last data filed at 11/02/2020 0842 Gross per 24 hour  Intake 10 ml  Output 2311 ml  Net -2301 ml    Filed Weights   10/27/20 1335 10/30/20 1216  Weight: 133.8 kg 134.2 kg    Examination:  Eomi ncat no focal deficit, AE decreased slight R side lung Abd soft, slight distention no rebound or guarding, drain in place, Cholecystotomy in place Edema seems slightly improved in arms and legs now about 2/4 Moving 4 limbs equally without gross deficit coherent alert  Data Reviewed: personally reviewed   CBC    Component Value Date/Time   WBC 17.3 (H) 11/02/2020 0715   RBC 2.91 (L) 11/02/2020 0715   HGB 9.3 (L) 11/02/2020 0715   HGB 12.3 (L) 08/19/2020 0940   HCT 27.1 (L) 11/02/2020 0715   HCT 36.5 (L) 08/19/2020 0940   PLT 255 11/02/2020 0715   PLT 151 08/19/2020 0940   MCV 93.1 11/02/2020 0715    MCV 97 08/19/2020 0940   MCH 32.0 11/02/2020 0715   MCHC 34.3 11/02/2020 0715   RDW 13.2 11/02/2020 0715   RDW 12.7 08/19/2020 0940   LYMPHSABS 0.5 (L) 11/01/2020 0021   LYMPHSABS 0.5 (L) 08/19/2020 0940   MONOABS 1.1 (H) 11/01/2020 0021   EOSABS 0.2 11/01/2020 0021   EOSABS 0.2 08/19/2020 0940   BASOSABS 0.1 11/01/2020 0021   BASOSABS 0.0 08/19/2020 0940   CMP Latest Ref Rng & Units 11/02/2020 11/01/2020 10/31/2020  Glucose 70 - 99 mg/dL 169(H) 153(H) 157(H)  BUN 8 - 23 mg/dL 33(H) 31(H) 25(H)  Creatinine 0.61 - 1.24 mg/dL 3.49(H) 3.31(H) 2.44(H)  Sodium 135 - 145 mmol/L 127(L) 125(L) 126(L)  Potassium 3.5 - 5.1 mmol/L 3.8 3.8 3.7  Chloride 98 - 111 mmol/L 93(L) 90(L) 91(L)  CO2 22 - 32 mmol/L '23 25 24  '$ Calcium 8.9 - 10.3 mg/dL 7.9(L) 7.8(L) 7.9(L)  Total Protein 6.5 - 8.1 g/dL - - 5.3(L)  Total Bilirubin 0.3 - 1.2 mg/dL - - 0.8  Alkaline Phos 38 - 126 U/L - - 58  AST 15 - 41 U/L - - 16  ALT 0 - 44 U/L - - 20     Radiology Studies: DG Chest 1 View  Result Date: 11/01/2020 CLINICAL DATA:  Status post thoracentesis. EXAM: CHEST  1 VIEW COMPARISON:  One view chest x-ray  11/01/2020 at 7:55 a.m. FINDINGS: The heart is enlarged. Right pleural effusion is significantly decreased. No significant pneumothorax is present. Right-sided chest tube remains in place. Right basilar airspace disease again noted. IMPRESSION: 1. Significant decrease in right pleural effusion without pneumothorax. 2. Right-sided chest tube remains in place. 3. Moderate residual pleural fluid remains. 4. Residual right lower lobe airspace disease. Electronically Signed   By: San Morelle M.D.   On: 11/01/2020 16:36   DG Chest 2 View  Result Date: 11/01/2020 CLINICAL DATA:  Pleural effusion EXAM: CHEST - 2 VIEW COMPARISON:  10/15/2020 chest radiograph. FINDINGS: Stable cardiomediastinal silhouette with normal heart size. No pneumothorax. Large right pleural effusion is new from prior radiograph. No left pleural  effusion. Near complete right lung atelectasis. Clear left lung. Pigtail catheter overlies the peripheral aspect of the right upper abdomen. IMPRESSION: Large right pleural effusion, new from prior radiograph. Near complete right lung atelectasis. Electronically Signed   By: Ilona Sorrel M.D.   On: 11/01/2020 10:00   US THORACENTESIS ASP PLEURAL SPACE W/IMG GUIDE  Result Date: 11/01/2020 INDICATION: Patient with history of cholecystitis, hepatic abscess, prostate cancer, chronic kidney disease, right pleural effusion; request received for diagnostic and therapeutic right thoracentesis. EXAM: ULTRASOUND GUIDED DIAGNOSTIC AND THERAPEUTIC RIGHT THORACENTESIS MEDICATIONS: 1% lidocaine to skin and subcutaneous tissue COMPLICATIONS: None immediate. PROCEDURE: An ultrasound guided thoracentesis was thoroughly discussed with the patient and questions answered. The benefits, risks, alternatives and complications were also discussed. The patient understands and wishes to proceed with the procedure. Written consent was obtained. Ultrasound was performed to localize and mark an adequate pocket of fluid in the right chest. The area was then prepped and draped in the normal sterile fashion. 1% Lidocaine was used for local anesthesia. Under ultrasound guidance a 6 Fr Safe-T-Centesis catheter was introduced. Thoracentesis was performed. The catheter was removed and a dressing applied. FINDINGS: A total of approximately 950 cc of yellow fluid was removed. Samples were sent to the laboratory as requested by the clinical team. Due to patient coughing and chest discomfort only the above amount of fluid was removed today. The pleural fluid collection was multiloculated in nature. IMPRESSION: Successful ultrasound guided diagnostic and therapeutic right thoracentesis yielding 950 cc of pleural fluid. Read by: Rowe Robert, PA-C Electronically Signed   By: Aletta Edouard M.D.   On: 11/01/2020 14:58     Scheduled Meds:  amLODipine   10 mg Oral Daily   amoxicillin-clavulanate  1 tablet Oral Q12H   vitamin C  250 mg Oral BID   aspirin EC  81 mg Oral Daily   enoxaparin (LOVENOX) injection  65 mg Subcutaneous Daily   famotidine  20 mg Oral QHS   furosemide  80 mg Intravenous Q12H   insulin aspart  0-15 Units Subcutaneous TID WC   insulin glargine-yfgn  12 Units Subcutaneous QHS   multivitamin with minerals  1 tablet Oral Daily   oxybutynin  5 mg Oral BID   pantoprazole  40 mg Oral Daily   Ensure Max Protein  11 oz Oral BID   saccharomyces boulardii  250 mg Oral BID   simvastatin  20 mg Oral QHS   sodium chloride flush  5 mL Intracatheter Q8H   tamsulosin  0.4 mg Oral BID PC   Continuous Infusions:   LOS: 10 days   Time spent: [place], MD [facility] To contact the attending provider between 7A-7P or the covering provider during after hours 7P-7A, please log into the web  site [email or web address] and access using universal [place] password for that web site. If you do not have the password, please call the hospital operator.  11/02/2020, 11:33 AM

## 2020-11-03 ENCOUNTER — Inpatient Hospital Stay (HOSPITAL_COMMUNITY): Payer: Medicare PPO

## 2020-11-03 LAB — CBC WITH DIFFERENTIAL/PLATELET
Abs Immature Granulocytes: 0.14 10*3/uL — ABNORMAL HIGH (ref 0.00–0.07)
Basophils Absolute: 0.1 10*3/uL (ref 0.0–0.1)
Basophils Relative: 1 %
Eosinophils Absolute: 0.2 10*3/uL (ref 0.0–0.5)
Eosinophils Relative: 2 %
HCT: 26.2 % — ABNORMAL LOW (ref 39.0–52.0)
Hemoglobin: 8.9 g/dL — ABNORMAL LOW (ref 13.0–17.0)
Immature Granulocytes: 1 %
Lymphocytes Relative: 4 %
Lymphs Abs: 0.4 10*3/uL — ABNORMAL LOW (ref 0.7–4.0)
MCH: 31.6 pg (ref 26.0–34.0)
MCHC: 34 g/dL (ref 30.0–36.0)
MCV: 92.9 fL (ref 80.0–100.0)
Monocytes Absolute: 0.8 10*3/uL (ref 0.1–1.0)
Monocytes Relative: 7 %
Neutro Abs: 9.5 10*3/uL — ABNORMAL HIGH (ref 1.7–7.7)
Neutrophils Relative %: 85 %
Platelets: 267 10*3/uL (ref 150–400)
RBC: 2.82 MIL/uL — ABNORMAL LOW (ref 4.22–5.81)
RDW: 13.3 % (ref 11.5–15.5)
WBC: 11 10*3/uL — ABNORMAL HIGH (ref 4.0–10.5)
nRBC: 0 % (ref 0.0–0.2)

## 2020-11-03 LAB — COMPREHENSIVE METABOLIC PANEL
ALT: 23 U/L (ref 0–44)
AST: 23 U/L (ref 15–41)
Albumin: 1.7 g/dL — ABNORMAL LOW (ref 3.5–5.0)
Alkaline Phosphatase: 53 U/L (ref 38–126)
Anion gap: 13 (ref 5–15)
BUN: 27 mg/dL — ABNORMAL HIGH (ref 8–23)
CO2: 28 mmol/L (ref 22–32)
Calcium: 8.5 mg/dL — ABNORMAL LOW (ref 8.9–10.3)
Chloride: 91 mmol/L — ABNORMAL LOW (ref 98–111)
Creatinine, Ser: 2.79 mg/dL — ABNORMAL HIGH (ref 0.61–1.24)
GFR, Estimated: 23 mL/min — ABNORMAL LOW (ref >60)
Glucose, Bld: 136 mg/dL — ABNORMAL HIGH (ref 70–99)
Potassium: 3.1 mmol/L — ABNORMAL LOW (ref 3.5–5.1)
Sodium: 132 mmol/L — ABNORMAL LOW (ref 135–145)
Total Bilirubin: 0.4 mg/dL (ref 0.3–1.2)
Total Protein: 5.4 g/dL — ABNORMAL LOW (ref 6.5–8.1)

## 2020-11-03 LAB — UREA NITROGEN, URINE: Urea Nitrogen, Ur: 310 mg/dL

## 2020-11-03 LAB — GLUCOSE, CAPILLARY
Glucose-Capillary: 122 mg/dL — ABNORMAL HIGH (ref 70–99)
Glucose-Capillary: 153 mg/dL — ABNORMAL HIGH (ref 70–99)
Glucose-Capillary: 181 mg/dL — ABNORMAL HIGH (ref 70–99)
Glucose-Capillary: 154 mg/dL — ABNORMAL HIGH (ref 70–99)

## 2020-11-03 MED ORDER — ENOXAPARIN SODIUM 80 MG/0.8ML IJ SOSY
65.0000 mg | PREFILLED_SYRINGE | Freq: Every day | INTRAMUSCULAR | Status: DC
  Administered 2020-11-04 – 2020-11-05 (×2): 65 mg via SUBCUTANEOUS
  Filled 2020-11-03 (×2): qty 0.8

## 2020-11-03 MED ORDER — POTASSIUM CHLORIDE CRYS ER 20 MEQ PO TBCR
20.0000 meq | EXTENDED_RELEASE_TABLET | Freq: Every day | ORAL | Status: DC
  Administered 2020-11-03 – 2020-11-05 (×3): 20 meq via ORAL
  Filled 2020-11-03 (×3): qty 1

## 2020-11-03 NOTE — Progress Notes (Signed)
PT Cancellation Note  Patient Details Name: James Glover MRN: [id number] DOB: [birth year]   Cancelled Treatment:    Reason Eval/Treat Not Completed: Fatigue/lethargy limiting ability to participate.  Pt fatigued from being up all night urinating.  He would prefer to walk later today (after lunch).  PT to check back later as time allows.   Thanks,  Verdene Lennert, PT, DPT  Acute Rehabilitation Ortho Tech Supervisor [phone] pager #[phone] office      Wells Guiles B Legend [place] 11/03/2020, 11:13 AM

## 2020-11-03 NOTE — Progress Notes (Signed)
PROGRESS NOTE   James Glover  [id number] DOB: November 06, 1945 DOA: 10/23/2020 PCP: [name], MD  Brief Narrative:  75 year old white male CKD stage III, prostate CA in remission status post brachytherapy 2020 DM TY 2 HTN HLD reflux Recent hospitalization 8/17--8/22 with acute cholecystitis Rx Unasyn-->Augmentin to complete 9/16 Blood cultures at that time grew Streptococcus [place] lytic [place] and patient was converted to oral Augmentin as repeat cultures 8/19  neg-- patient transferred to skilled facility  He developed abdominal RUQ pain after diet resumed-he went to follow-up with general surgery office and was found to have enlarged liver abscess on CT with complex intraparenchymal pericholecystic abscesses  General surgery recommended drainage  8/26 perc cholecystotomy tubes placed in for a hepatic abscess, superior perihepatic abscess 8/31 repeat CT abdomen -drain #2 removed    Hospital-Problem based course  Sepsis on admission secondary to acalculous cholecystitis with liver abscess complicated by severe sepsis with endorgan damage on admission Hepatic drains x 1 + cholecystotomy tube CT 9/1 reviewed-overall decreasing size of several areas of Abscess.  Drain #2 pulled by IR 9/1--IR recommending flushing abscess c 5cc NS, dressing changes q2-3 d,  White count  down to 11--zosyn -->Augmentin on 9/1--treat ~ 3 weeks til 9/14 Eventual outpatient cholecystectomy per Dr. Bridges-Cholecystotomy to stay in at this time Pain control tramadol 50 every 12 as needed moderate pain-discontinue IV Dilaudid ATN likely secondary to contrast-induced nephropathy causing AKI superimposed CKD 3 AA  rising creatinine--?  Combination of IV contrast for CT scan/sepsis Lasix has been held as has HCTZ and ARB Bladder scan is negative, Ua neg Creatinine improving slowly Mild hypokalemia Likely 2/2 lasix--Replace orally Probable exudative pleural effusion secondary to adjacent abscesses  Thoracentesis  performed 9/3 yielded 900 cc: Results 51 nucleated cells, LDH 454 Right chest tube remains in place repeat chest x-ray 9/5 seems about the same to my read--need to use I-S incentive spir Thoracentesis cultures NGTD 9/3--already on Augmentin Diarrhea not otherwise specified Hold testing for C. difficile given resolution of white count DM TY 2 with dyslipidemia Eating 100% meals-CBGs ranging 120-160 Continue 12 units of long-acting insulin with moderate sliding scale HTN uncontrolled Increased amlodipine to 10, hydralazine 25 every 6 as needed,  HCTZ 25 held Stage II left buttock pressure ulcer from prior to admission Encourage mobility Prostate cancer status post brachytherapy 2020 Continue outpatient evaluation management and continue Ditropan 5 twice daily, Flomax 0.4 twice daily  DVT prophylaxis: Lovenox Code Status: Full Family Communication: Discussed with wife at the bedside [phone] Disposition:  Status is: Inpatient  Remains inpatient appropriate because:Persistent severe electrolyte disturbances, IV treatments appropriate due to intensity of illness or inability to take PO, and Inpatient level of care appropriate due to severity of illness  Dispo: The patient is from: Home              Anticipated d/c is to:  Home with home health              Patient currently is not medically stable to d/c.   Difficult to place patient No  Consultants:  General surgery Interventional radiology  Procedures:   8/26 10 French cholecystotomy 8/26 infrahepatic collection 10 French drain + suction bulb 8/26 severe perihepatic 10 French drain with suction bulb 8/26 intrahepatic collections noted GB fossa drained with 18 G needle CT repeat 9/1  Marked enlargement of RIGHT-sided pleural fluid collection with basilar and generalized volume loss throughout the RIGHT lung since the previous study. Superior perihepatic drain may traverse the  now expanded pleural space. While pleural fluid  in the RIGHT chest may be reactive, the possibility of chest involvement from abdominal infection is considered based on the above constellation of findings. Fluid sampling and/or drainage as warranted is suggested.   Diminished size of intrahepatic pericholecystic abscess with decompression of the gallbladder and near complete resolution of perihepatic fluid. Thoracentesis 9/3-900 cc fluid  Antimicrobials: Zosyn-->augmentin   Subjective:  Awake coherent in better spirits ambulated today No chest pain no abdominal pain still swollen no nausea no vomiting still has loose stools no blurred vision or double vision    Objective: Vitals:   11/02/20 1546 11/02/20 2012 11/03/20 0500 11/03/20 0812  BP: (!) 152/55 (!) 147/60 (!) 158/54 (!) 139/59  Pulse: 88 81 85 77  Resp: 18 (!) 22  18  Temp: 98.7 F (37.1 C) 98.4 F (36.9 C) 98.2 F (36.8 C) 98.2 F (36.8 C)  TempSrc: Oral Oral Oral Oral  SpO2: 96% 93% 93% 94%  Weight:      Height:        Intake/Output Summary (Last 24 hours) at 11/03/2020 1334 Last data filed at 11/03/2020 1125 Gross per 24 hour  Intake 245 ml  Output 2405 ml  Net -2160 ml    Filed Weights   10/27/20 1335 10/30/20 1216  Weight: 133.8 kg 134.2 kg    Examination:  EOMI NCAT less depressed chest clear no added sound no rales no rhonchi Abdomen slightly distended drains not examined today Grade 2 lower extremity edema left arm is more swollen than right Chest is clear no added sound ROM intact power 5/5   Data Reviewed: personally reviewed   CBC    Component Value Date/Time   WBC 11.0 (H) 11/03/2020 0714   RBC 2.82 (L) 11/03/2020 0714   HGB 8.9 (L) 11/03/2020 0714   HGB 12.3 (L) 08/19/2020 0940   HCT 26.2 (L) 11/03/2020 0714   HCT 36.5 (L) 08/19/2020 0940   PLT 267 11/03/2020 0714   PLT 151 08/19/2020 0940   MCV 92.9 11/03/2020 0714   MCV 97 08/19/2020 0940   MCH 31.6 11/03/2020 0714   MCHC 34.0 11/03/2020 0714   RDW 13.3 11/03/2020 0714    RDW 12.7 08/19/2020 0940   LYMPHSABS 0.4 (L) 11/03/2020 0714   LYMPHSABS 0.5 (L) 08/19/2020 0940   MONOABS 0.8 11/03/2020 0714   EOSABS 0.2 11/03/2020 0714   EOSABS 0.2 08/19/2020 0940   BASOSABS 0.1 11/03/2020 0714   BASOSABS 0.0 08/19/2020 0940   CMP Latest Ref Rng & Units 11/03/2020 11/02/2020 11/01/2020  Glucose 70 - 99 mg/dL 136(H) 169(H) 153(H)  BUN 8 - 23 mg/dL 27(H) 33(H) 31(H)  Creatinine 0.61 - 1.24 mg/dL 2.79(H) 3.49(H) 3.31(H)  Sodium 135 - 145 mmol/L 132(L) 127(L) 125(L)  Potassium 3.5 - 5.1 mmol/L 3.1(L) 3.8 3.8  Chloride 98 - 111 mmol/L 91(L) 93(L) 90(L)  CO2 22 - 32 mmol/L '28 23 25  '$ Calcium 8.9 - 10.3 mg/dL 8.5(L) 7.9(L) 7.8(L)  Total Protein 6.5 - 8.1 g/dL 5.4(L) - -  Total Bilirubin 0.3 - 1.2 mg/dL 0.4 - -  Alkaline Phos 38 - 126 U/L 53 - -  AST 15 - 41 U/L 23 - -  ALT 0 - 44 U/L 23 - -     Radiology Studies: DG Chest 1 View  Result Date: 11/01/2020 CLINICAL DATA:  Status post thoracentesis. EXAM: CHEST  1 VIEW COMPARISON:  One view chest x-ray 11/01/2020 at 7:55 a.m. FINDINGS: The heart is enlarged. Right pleural  effusion is significantly decreased. No significant pneumothorax is present. Right-sided chest tube remains in place. Right basilar airspace disease again noted. IMPRESSION: 1. Significant decrease in right pleural effusion without pneumothorax. 2. Right-sided chest tube remains in place. 3. Moderate residual pleural fluid remains. 4. Residual right lower lobe airspace disease. Electronically Signed   By: San Morelle M.D.   On: 11/01/2020 16:36   DG Chest 2 View  Result Date: 11/03/2020 CLINICAL DATA:  Right pleural effusion. Status post thoracentesis on 11/01/2020 EXAM: CHEST - 2 VIEW COMPARISON:  11/01/2020 FINDINGS: Stable heart size. Residual predominantly lateral right pleural effusion appears similar to the post thoracentesis chest x-ray on 11/01/2020. Residual pleural fluid may be partially loculated. Stable atelectasis of the right lower lung.  No pneumothorax or pulmonary edema. IMPRESSION: Stable predominantly lateral right pleural effusion compared to the recent post thoracentesis chest x-ray. Stable atelectasis of the right lower lung. Electronically Signed   By: Aletta Edouard M.D.   On: 11/03/2020 08:06   US THORACENTESIS ASP PLEURAL SPACE W/IMG GUIDE  Result Date: 11/01/2020 INDICATION: Patient with history of cholecystitis, hepatic abscess, prostate cancer, chronic kidney disease, right pleural effusion; request received for diagnostic and therapeutic right thoracentesis. EXAM: ULTRASOUND GUIDED DIAGNOSTIC AND THERAPEUTIC RIGHT THORACENTESIS MEDICATIONS: 1% lidocaine to skin and subcutaneous tissue COMPLICATIONS: None immediate. PROCEDURE: An ultrasound guided thoracentesis was thoroughly discussed with the patient and questions answered. The benefits, risks, alternatives and complications were also discussed. The patient understands and wishes to proceed with the procedure. Written consent was obtained. Ultrasound was performed to localize and mark an adequate pocket of fluid in the right chest. The area was then prepped and draped in the normal sterile fashion. 1% Lidocaine was used for local anesthesia. Under ultrasound guidance a 6 Fr Safe-T-Centesis catheter was introduced. Thoracentesis was performed. The catheter was removed and a dressing applied. FINDINGS: A total of approximately 950 cc of yellow fluid was removed. Samples were sent to the laboratory as requested by the clinical team. Due to patient coughing and chest discomfort only the above amount of fluid was removed today. The pleural fluid collection was multiloculated in nature. IMPRESSION: Successful ultrasound guided diagnostic and therapeutic right thoracentesis yielding 950 cc of pleural fluid. Read by: Rowe Robert, PA-C Electronically Signed   By: Aletta Edouard M.D.   On: 11/01/2020 14:58     Scheduled Meds:  amLODipine  10 mg Oral Daily   amoxicillin-clavulanate   1 tablet Oral Q12H   vitamin C  250 mg Oral BID   aspirin EC  81 mg Oral Daily   [START ON 11/04/2020] enoxaparin (LOVENOX) injection  65 mg Subcutaneous Daily   famotidine  20 mg Oral QHS   insulin aspart  0-15 Units Subcutaneous TID WC   insulin glargine-yfgn  12 Units Subcutaneous QHS   multivitamin with minerals  1 tablet Oral Daily   oxybutynin  5 mg Oral BID   pantoprazole  40 mg Oral Daily   potassium chloride  20 mEq Oral Daily   Ensure Max Protein  11 oz Oral BID   saccharomyces boulardii  250 mg Oral BID   simvastatin  20 mg Oral QHS   sodium chloride flush  5 mL Intracatheter Q8H   tamsulosin  0.4 mg Oral BID PC   Continuous Infusions:   LOS: 11 days   Time spent: Warren, MD [facility] To contact the attending provider between 7A-7P or the covering provider during after hours 7P-7A, please log into  the web site [email or web address] and access using universal [place] password for that web site. If you do not have the password, please call the hospital operator.  11/03/2020, 1:34 PM

## 2020-11-03 NOTE — Progress Notes (Signed)
Occupational Therapy Treatment Patient Details Name: James Glover MRN: [id number] DOB: [birth year] Today's Date: 11/03/2020    History of present illness The pt is a 75 yo male presenting 8/25 from SNF due to ultrasound and HIDA scan concerning for gangrenous cholecystitis and larger liver abscess. Pt reccently hospitalized 8/17-8/22 due to acute cholecystitis complicated by subcapsular live abscess and infection and d/c to SNF for short term rehab. PMH includes: aortic atherosclerosis, CKD III, HTN, DM II, and prostate cancer.   OT comments  James Glover is progressing well. Session focused on increasing endurance for functional mobility and energy conservation techniques. Pt was supervision for all transfers and functional mobility with RW. He required 2 sitting rest breaks and verbalized great understanding of energy conservation techniques. He continued to benefit from continued OT acutely. D/c plan remains appropriate.    Follow Up Recommendations  Home health OT;Supervision/Assistance - 24 hour    Equipment Recommendations  None recommended by OT       Precautions / Restrictions Precautions Precautions: Fall;Other (comment) Precaution Comments: chole tube, x2 drains, watch O2 Restrictions Weight Bearing Restrictions: No       Mobility Bed Mobility Overal bed mobility: Needs Assistance Bed Mobility: Supine to Sit     Supine to sit: Min assist     General bed mobility comments: Encouraged pt to get OOB with supervision only however he then called his wife over for min A for elevatign his trunk. Educated pt on the need to practice indep bed mobility    Transfers Overall transfer level: Needs assistance Equipment used: Rolling walker (2 wheeled) Transfers: Sit to/from Stand Sit to Stand: Supervision         General transfer comment: Supervision for safety with extra time and multiple trunk rocking to gain momentum to stand from various surfaces (short bench, chair, EOB).  No LOB.    Balance Overall balance assessment: Needs assistance Sitting-balance support: No upper extremity supported Sitting balance-[name] Scale: Good Sitting balance - Comments: seated EOB   Standing balance support: Bilateral upper extremity supported;During functional activity Standing balance-[name] Scale: Poor Standing balance comment: UE support for mobility.                           ADL either performed or assessed with clinical judgement   ADL Overall ADL's : Needs assistance/impaired                                     Functional mobility during ADLs: Rolling walker;Cueing for safety General ADL Comments: session focused on incrased activity tolerance throughout functional mobility and energry conservation skills      Cognition Arousal/Alertness: Awake/alert Behavior During Therapy: WFL for tasks assessed/performed Overall Cognitive Status: Impaired/Different from baseline Area of Impairment: Problem solving                             Problem Solving: Slow processing;Difficulty sequencing;Requires verbal cues;Requires tactile cues General Comments: pt shows great insigh tto his deficits              General Comments SpO2 >91% the entire session; pt was mildly SOB after ambulating ~30 ft.    Pertinent Vitals/ Pain       Pain Assessment: Faces Faces Pain Scale: Hurts a little bit Pain Location: generalized discomfort Pain Descriptors / Indicators: Grimacing;Discomfort Pain Intervention(s): Limited  activity within patient's tolerance;Monitored during session;Repositioned   Frequency  Min 2X/week        Progress Toward Goals  OT Goals(current goals can now be found in the care plan section)  Progress towards OT goals: Progressing toward goals  Acute Rehab OT Goals Patient Stated Goal: to go home OT Goal Formulation: With patient Time For Goal Achievement: 11/09/20 Potential to Achieve Goals: Fair ADL  Goals Pt Will Perform Grooming: with supervision;standing Pt Will Perform Lower Body Bathing: with set-up;sit to/from stand Pt Will Perform Lower Body Dressing: with set-up;sit to/from stand Pt Will Transfer to Toilet: with modified independence;ambulating Pt Will Perform Toileting - Clothing Manipulation and hygiene: with modified independence Additional ADL Goal #1: Pt will tolerate at least 5 minutes of OOB functional activity to prepare for safe discharge home  Plan Discharge plan remains appropriate       AM-PAC OT "6 Clicks" Daily Activity     Outcome Measure   Help from another person eating meals?: None Help from another person taking care of personal grooming?: A Little Help from another person toileting, which includes using toliet, bedpan, or urinal?: A Little Help from another person bathing (including washing, rinsing, drying)?: A Lot Help from another person to put on and taking off regular upper body clothing?: None Help from another person to put on and taking off regular lower body clothing?: A Lot 6 Click Score: 18    End of Session Equipment Utilized During Treatment: Gait belt;Rolling walker  OT Visit Diagnosis: Unsteadiness on feet (R26.81);Muscle weakness (generalized) (M62.81);Pain   Activity Tolerance Patient tolerated treatment well;Other (comment)   Patient Left in chair;with call bell/phone within reach;with family/visitor present   Nurse Communication Mobility status;Precautions;Weight bearing status        Time: [id number] OT Time Calculation (min): 19 min  Charges: OT General Charges $OT Visit: 1 Visit OT Treatments $Therapeutic Activity: 8-22 mins    James Glover 11/03/2020, 12:57 PM

## 2020-11-03 NOTE — Progress Notes (Signed)
[facility] NEPHROLOGY PROGRESS NOTE  Assessment/ Plan:  #Acute kidney injury on CKD, nonoliguric likely due to ATN from sepsis and contrast induced nephropathy.  Baseline creatinine level around 1.9. UA bland.  CT scan of abdomen pelvis with contrast without hydronephrosis.  Urine output is increasing and creatinine level trending down.  Recommend to hold diuretics today and continue watch for renal recovery.  Continue to hold ARB and HCTZ.  He had urine output twice this morning which was not recorded per patient's wife.  Recommend a strict ins and out, daily lab.  #Acalculous cholecystitis complicated with liver abscess and severe sepsis: Status post hepatic drain, cholecystostomy tube and intrahepatic abscess drain.  On Augmentin.  Per primary team.  #Right pleural effusion status post thoracocentesis and 9/3.  #Edema: Receiving intermittent diuretics.  Recommend salt and fluid restriction.  #Hypertension: On amlodipine.  ARB on hold.  #Hyponatremia due to AKI and diarrhea.  Sodium level improving.  #Hypokalemia: Replete potassium chloride.  Subjective: Seen and examined at bedside.  Patient had around 2 L of urine output documented in 24 hours.  He was complaining about frequent urination after diuretics.  Denies nausea, vomiting, chest pain, shortness of breath.  His wife at bedside. Objective Vital signs in last 24 hours: Vitals:   11/02/20 1546 11/02/20 2012 11/03/20 0500 11/03/20 0812  BP: (!) 152/55 (!) 147/60 (!) 158/54 (!) 139/59  Pulse: 88 81 85 77  Resp: 18 (!) 22  18  Temp: 98.7 F (37.1 C) 98.4 F (36.9 C) 98.2 F (36.8 C) 98.2 F (36.8 C)  TempSrc: Oral Oral Oral Oral  SpO2: 96% 93% 93% 94%  Weight:      Height:       Weight change:   Intake/Output Summary (Last 24 hours) at 11/03/2020 1210 Last data filed at 11/03/2020 1125 Gross per 24 hour  Intake 245 ml  Output 2405 ml  Net -2160 ml       Labs: Basic Metabolic Panel: Recent Labs   Lab 10/29/20 0040 10/30/20 0327 11/01/20 0021 11/02/20 0715 11/03/20 0714  NA 130*   < > 125* 127* 132*  K 3.8   < > 3.8 3.8 3.1*  CL 97*   < > 90* 93* 91*  CO2 25   < > '25 23 28  '$ GLUCOSE 167*   < > 153* 169* 136*  BUN 19   < > 31* 33* 27*  CREATININE 1.63*   < > 3.31* 3.49* 2.79*  CALCIUM 7.9*   < > 7.8* 7.9* 8.5*  PHOS 3.2  --  4.1  --   --    < > = values in this interval not displayed.   Liver Function Tests: Recent Labs  Lab 10/30/20 0327 10/31/20 0044 11/01/20 0021 11/03/20 0714  AST 17 16  --  23  ALT 20 20  --  23  ALKPHOS 58 58  --  53  BILITOT 0.8 0.8  --  0.4  PROT 5.2* 5.3*  --  5.4*  ALBUMIN 1.6* 1.7* 1.7* 1.7*   No results for input(s): LIPASE, AMYLASE in the last 168 hours. No results for input(s): AMMONIA in the last 168 hours. CBC: Recent Labs  Lab 10/30/20 0327 10/31/20 0044 11/01/20 0021 11/02/20 0715 11/03/20 0714  WBC 13.0* 13.5* 14.1* 17.3* 11.0*  NEUTROABS  --  11.5* 12.1*  --  9.5*  HGB 9.8* 9.4* 8.9* 9.3* 8.9*  HCT 29.6* 28.0* 26.7* 27.1* 26.2*  MCV 95.2 94.0 93.7 93.1 92.9  PLT 312 282 274 255 267   Cardiac Enzymes: No results for input(s): CKTOTAL, CKMB, CKMBINDEX, TROPONINI in the last 168 hours. CBG: Recent Labs  Lab 11/02/20 1145 11/02/20 1609 11/02/20 2201 11/03/20 0812 11/03/20 1201  GLUCAP 193* 203* 140* 122* 153*    Iron Studies: No results for input(s): IRON, TIBC, TRANSFERRIN, FERRITIN in the last 72 hours. Studies/Results: DG Chest 1 View  Result Date: 11/01/2020 CLINICAL DATA:  Status post thoracentesis. EXAM: CHEST  1 VIEW COMPARISON:  One view chest x-ray 11/01/2020 at 7:55 a.m. FINDINGS: The heart is enlarged. Right pleural effusion is significantly decreased. No significant pneumothorax is present. Right-sided chest tube remains in place. Right basilar airspace disease again noted. IMPRESSION: 1. Significant decrease in right pleural effusion without pneumothorax. 2. Right-sided chest tube remains in place. 3.  Moderate residual pleural fluid remains. 4. Residual right lower lobe airspace disease. Electronically Signed   By: San Morelle M.D.   On: 11/01/2020 16:36   DG Chest 2 View  Result Date: 11/03/2020 CLINICAL DATA:  Right pleural effusion. Status post thoracentesis on 11/01/2020 EXAM: CHEST - 2 VIEW COMPARISON:  11/01/2020 FINDINGS: Stable heart size. Residual predominantly lateral right pleural effusion appears similar to the post thoracentesis chest x-ray on 11/01/2020. Residual pleural fluid may be partially loculated. Stable atelectasis of the right lower lung. No pneumothorax or pulmonary edema. IMPRESSION: Stable predominantly lateral right pleural effusion compared to the recent post thoracentesis chest x-ray. Stable atelectasis of the right lower lung. Electronically Signed   By: Aletta Edouard M.D.   On: 11/03/2020 08:06   US THORACENTESIS ASP PLEURAL SPACE W/IMG GUIDE  Result Date: 11/01/2020 INDICATION: Patient with history of cholecystitis, hepatic abscess, prostate cancer, chronic kidney disease, right pleural effusion; request received for diagnostic and therapeutic right thoracentesis. EXAM: ULTRASOUND GUIDED DIAGNOSTIC AND THERAPEUTIC RIGHT THORACENTESIS MEDICATIONS: 1% lidocaine to skin and subcutaneous tissue COMPLICATIONS: None immediate. PROCEDURE: An ultrasound guided thoracentesis was thoroughly discussed with the patient and questions answered. The benefits, risks, alternatives and complications were also discussed. The patient understands and wishes to proceed with the procedure. Written consent was obtained. Ultrasound was performed to localize and mark an adequate pocket of fluid in the right chest. The area was then prepped and draped in the normal sterile fashion. 1% Lidocaine was used for local anesthesia. Under ultrasound guidance a 6 Fr Safe-T-Centesis catheter was introduced. Thoracentesis was performed. The catheter was removed and a dressing applied. FINDINGS: A total  of approximately 950 cc of yellow fluid was removed. Samples were sent to the laboratory as requested by the clinical team. Due to patient coughing and chest discomfort only the above amount of fluid was removed today. The pleural fluid collection was multiloculated in nature. IMPRESSION: Successful ultrasound guided diagnostic and therapeutic right thoracentesis yielding 950 cc of pleural fluid. Read by: Rowe Robert, PA-C Electronically Signed   By: Aletta Edouard M.D.   On: 11/01/2020 14:58    Medications: Infusions:   Scheduled Medications:  amLODipine  10 mg Oral Daily   amoxicillin-clavulanate  1 tablet Oral Q12H   vitamin C  250 mg Oral BID   aspirin EC  81 mg Oral Daily   [START ON 11/04/2020] enoxaparin (LOVENOX) injection  65 mg Subcutaneous Daily   famotidine  20 mg Oral QHS   insulin aspart  0-15 Units Subcutaneous TID WC   insulin glargine-yfgn  12 Units Subcutaneous QHS   multivitamin with minerals  1 tablet Oral Daily   oxybutynin  5 mg  Oral BID   pantoprazole  40 mg Oral Daily   potassium chloride  20 mEq Oral Daily   Ensure Max Protein  11 oz Oral BID   saccharomyces boulardii  250 mg Oral BID   simvastatin  20 mg Oral QHS   sodium chloride flush  5 mL Intracatheter Q8H   tamsulosin  0.4 mg Oral BID PC    have reviewed scheduled and prn medications.  Physical Exam: General:NAD, comfortable Heart:RRR, s1s2 nl Lungs:clear b/l, no crackle Abdomen:soft, Non-tender, Drain present. Extremities: LE edema present. Neurology: Alert, awake, nonfocal.  Justeen Hehr Tanna Furry 11/03/2020,12:10 PM  LOS: 11 days

## 2020-11-03 NOTE — Plan of Care (Signed)
  Problem: Education: Goal: Knowledge of General Education information will improve Description: Including pain rating scale, medication(s)/side effects and non-pharmacologic comfort measures Outcome: Progressing   Problem: Clinical Measurements: Goal: Will remain free from infection Outcome: Progressing   Problem: Clinical Measurements: Goal: Respiratory complications will improve Outcome: Progressing   Problem: Clinical Measurements: Goal: Cardiovascular complication will be avoided Outcome: Progressing   Problem: Nutrition: Goal: Adequate nutrition will be maintained Outcome: Progressing   Problem: Elimination: Goal: Will not experience complications related to bowel motility Outcome: Progressing   Problem: Elimination: Goal: Will not experience complications related to urinary retention Outcome: Progressing   Problem: Pain Managment: Goal: General experience of comfort will improve Outcome: Progressing   Problem: Skin Integrity: Goal: Risk for impaired skin integrity will decrease Outcome: Progressing

## 2020-11-04 LAB — RENAL FUNCTION PANEL
Albumin: 1.9 g/dL — ABNORMAL LOW (ref 3.5–5.0)
Anion gap: 10 (ref 5–15)
BUN: 22 mg/dL (ref 8–23)
CO2: 28 mmol/L (ref 22–32)
Calcium: 8.5 mg/dL — ABNORMAL LOW (ref 8.9–10.3)
Chloride: 94 mmol/L — ABNORMAL LOW (ref 98–111)
Creatinine, Ser: 2.21 mg/dL — ABNORMAL HIGH (ref 0.61–1.24)
GFR, Estimated: 30 mL/min — ABNORMAL LOW (ref >60)
Glucose, Bld: 150 mg/dL — ABNORMAL HIGH (ref 70–99)
Phosphorus: 3.4 mg/dL (ref 2.5–4.6)
Potassium: 3.5 mmol/L (ref 3.5–5.1)
Sodium: 132 mmol/L — ABNORMAL LOW (ref 135–145)

## 2020-11-04 LAB — GLUCOSE, CAPILLARY
Glucose-Capillary: 142 mg/dL — ABNORMAL HIGH (ref 70–99)
Glucose-Capillary: 211 mg/dL — ABNORMAL HIGH (ref 70–99)
Glucose-Capillary: 210 mg/dL — ABNORMAL HIGH (ref 70–99)
Glucose-Capillary: 133 mg/dL — ABNORMAL HIGH (ref 70–99)

## 2020-11-04 LAB — CBC WITH DIFFERENTIAL/PLATELET
Abs Immature Granulocytes: 0.12 10*3/uL — ABNORMAL HIGH (ref 0.00–0.07)
Basophils Absolute: 0.1 10*3/uL (ref 0.0–0.1)
Basophils Relative: 1 %
Eosinophils Absolute: 0.2 10*3/uL (ref 0.0–0.5)
Eosinophils Relative: 2 %
HCT: 29.6 % — ABNORMAL LOW (ref 39.0–52.0)
Hemoglobin: 9.7 g/dL — ABNORMAL LOW (ref 13.0–17.0)
Immature Granulocytes: 1 %
Lymphocytes Relative: 6 %
Lymphs Abs: 0.6 10*3/uL — ABNORMAL LOW (ref 0.7–4.0)
MCH: 31.2 pg (ref 26.0–34.0)
MCHC: 32.8 g/dL (ref 30.0–36.0)
MCV: 95.2 fL (ref 80.0–100.0)
Monocytes Absolute: 0.8 10*3/uL (ref 0.1–1.0)
Monocytes Relative: 8 %
Neutro Abs: 8.4 10*3/uL — ABNORMAL HIGH (ref 1.7–7.7)
Neutrophils Relative %: 82 %
Platelets: 295 10*3/uL (ref 150–400)
RBC: 3.11 MIL/uL — ABNORMAL LOW (ref 4.22–5.81)
RDW: 13.3 % (ref 11.5–15.5)
WBC: 10.2 10*3/uL (ref 4.0–10.5)
nRBC: 0 % (ref 0.0–0.2)

## 2020-11-04 MED ORDER — FUROSEMIDE 20 MG PO TABS
20.0000 mg | ORAL_TABLET | Freq: Every day | ORAL | Status: DC
  Administered 2020-11-04 – 2020-11-05 (×2): 20 mg via ORAL
  Filled 2020-11-04 (×2): qty 1

## 2020-11-04 MED ORDER — AMLODIPINE BESYLATE 10 MG PO TABS
10.0000 mg | ORAL_TABLET | Freq: Every day | ORAL | 2 refills | Status: DC
Start: 2020-11-05 — End: 2021-02-02

## 2020-11-04 MED ORDER — ONDANSETRON HCL 4 MG PO TABS: 4.0000 mg | ORAL_TABLET | Freq: Four times a day (QID) | ORAL | 0 refills | Status: DC | PRN

## 2020-11-04 MED ORDER — AMOXICILLIN-POT CLAVULANATE 500-125 MG PO TABS: 1.0000 | ORAL_TABLET | Freq: Two times a day (BID) | ORAL | 0 refills | Status: AC

## 2020-11-04 MED ORDER — FUROSEMIDE 20 MG PO TABS: 20.0000 mg | ORAL_TABLET | Freq: Every day | ORAL | 0 refills | Status: DC

## 2020-11-04 NOTE — Discharge Summary (Signed)
Physician Discharge Summary  James Glover [id number] DOB: 07-09-1945 DOA: 10/23/2020  PCP: [name], MD  Admit date: 10/23/2020 Discharge date: 11/04/2020  Time spent: 34 minutes  Recommendations for Outpatient Follow-up:  Will need outpatient coordination of drain study with interventional radiology Will need outpatient coordination of cholecystotomy management and eventual cholecystectomy by Dr. Constance Haw and general surgery Recommend Chem-12, CBC in about 1 week Please see MAR for change in meds specifically cutting back doses of ARB and diuretics given AKI this hospital stay Continued antibiotics until abscess drain is removed and then can probably discontinue the antibiotics-have given dosing until 9/14 Might require interval chest x-ray for pleural effusion which was exudative and probably related to contiguous abscess in the abdomen  Discharge Diagnoses:  MAIN problem for hospitalization   Liver abscess status post cholecystotomy in the outpatient setting  Please see below for itemized issues addressed in [facility]- refer to other progress notes for clarity if needed  Discharge Condition: Improved  Diet recommendation: Heart healthy  Filed Weights   10/27/20 1335 10/30/20 1216  Weight: 133.8 kg 134.2 kg    History of present illness:  75 year old white male CKD stage III, prostate CA in remission status post brachytherapy 2020 DM TY 2 HTN HLD reflux Recent hospitalization 8/17--8/22 with acute cholecystitis Rx Unasyn-->Augmentin to complete 9/16 Blood cultures at that time grew Streptococcus [place] lytic [place] and patient was converted to oral Augmentin as repeat cultures 8/19  neg-- patient transferred to skilled facility   He developed abdominal RUQ pain after diet resumed-he went to follow-up with general surgery office and was found to have enlarged liver abscess on CT with complex intraparenchymal pericholecystic abscesses  General surgery recommended drainage    8/26 perc cholecystotomy tubes placed in for a hepatic abscess, superior perihepatic abscess 8/31 repeat CT abdomen -drain #2 removed  Hospital Course:  Sepsis on admission secondary to acalculous cholecystitis with liver abscess complicated by severe sepsis with endorgan damage on admission Hepatic drains x 1 + cholecystotomy tube CT 9/1 reviewed-overall decreasing size of several areas of Abscess.  Drain #2 pulled by IR 9/1-- IR recommending flushing abscess drain #1 c 5cc NS, dressing changes q2-3 d- White count improved on Zosyn therefore -->Augmentin on 9/1--treat ~ 3 weeks til 9/14-may have to extend course of antibiotics depending on presence of drain and defer this to IR Eventual outpatient cholecystectomy per Dr. Joeseph Amor to stay in at this time Pain control tramadol 50 every 12 as needed moderate pain-discontinue IV Dilaudid ATN likely secondary to contrast-induced nephropathy causing AKI superimposed CKD 3 AA  rising creatinine--?  Combination of IV contrast for CT scan/sepsis ARB HCTZ discontinued Creatinine slowly improving Lasix resumed by nephrology on discharge patient has a follow-up with Dr. Marval Regal later this month Mild hypokalemia Likely 2/2 lasix--Replace orally Probable exudative pleural effusion secondary to adjacent abscesses  Thoracentesis performed 9/3 yielded 900 cc: Results 51 nucleated cells, LDH 454 Right chest tube remains in place repeat chest x-ray 9/5 seems about the same to my read--need to use I-S incentive spir--periodic outpatient interval chest x-ray Thoracentesis cultures NGTD 9/3--already on Augmentin Diarrhea not otherwise specified Hold testing for C. difficile given resolution of white count DM TY 2 with dyslipidemia Eating 100% meals-CBGs ranging 140 through 211 Medications resumed on discharge including oral saxagliptin, Actos and usual dose of long-acting insulin HTN uncontrolled Increased amlodipine 10 mg hydralazine  discontinued, HCTZ held  Stage II left buttock pressure ulcer from prior to admission Encourage mobility Prostate cancer status  post brachytherapy 2020 Continue outpatient evaluation management and continue Ditropan 5 twice daily, Flomax 0.4 twice daily  Procedures: As above  Consultations: General surgery  Discharge Exam: Vitals:   11/04/20 0422 11/04/20 0815  BP: (!) 142/57 (!) 154/55  Pulse: 85 79  Resp: 18 18  Temp: 98.3 F (36.8 C) 98.1 F (36.7 C)  SpO2: 94% 92%    Subj on day of d/c   Pleasant doing well eating better feels strongest he has since being admitted  General Exam on discharge  Alert coherent feels better no distress Chest clinically clear no added sound no rales or rhonchi slightly decreased air entry posterolaterally right side Abdomen soft drains in place no rebound no guarding ROM intact   Discharge Instructions   Discharge Instructions     Diet - low sodium heart healthy   Complete by: As directed    Discharge instructions   Complete by: As directed    We will place follow-up instructions for you with interventional radiology and general surgery and make them aware of your discharge so that they can follow-up your Drains--they will be calling you to let you know where you need to follow-up for drain study etc. Would recommend that you get labs in about 1 week to 10 days at your PCP office or on follow-up with one of your specialists You will notice that several medications have changed specifically your blood pressure medications and may be your medication to help control your fluid Report any high fevers chills nausea vomiting dark stool tarry stool can go to the emergency room if you have any new complaints   Increase activity slowly   Complete by: As directed    No wound care   Complete by: As directed       Allergies as of 11/04/2020       Reactions   Ace Inhibitors Cough   Statins Cough        Medication List     STOP taking  these medications    Accu-Chek Softclix Lancets lancets   amoxicillin-clavulanate 875-125 MG tablet Commonly known as: AUGMENTIN   BD Pen Needle Nano U/F 32G X 4 MM Misc Generic drug: Insulin Pen Needle   hydrochlorothiazide 25 MG tablet Commonly known as: HYDRODIURIL   losartan 50 MG tablet Commonly known as: COZAAR   valACYclovir 1000 MG tablet Commonly known as: VALTREX       TAKE these medications    Accu-Chek Guide test strip Generic drug: glucose blood USE AS DIRECTED   amLODipine 10 MG tablet Commonly known as: NORVASC Take 1 tablet (10 mg total) by mouth daily. Start taking on: November 05, 2020   aspirin 81 MG tablet Take 81 mg by mouth daily.   famotidine 40 MG tablet Commonly known as: PEPCID Take 40 mg by mouth at bedtime.   Fish Oil 1000 MG Caps Take 1,000 mg by mouth 2 (two) times daily.   fluorouracil 5 % cream Commonly known as: EFUDEX   furosemide 20 MG tablet Commonly known as: LASIX Take 1 tablet (20 mg total) by mouth daily. Start taking on: November 05, 2020   multivitamin with minerals tablet Take 1 tablet by mouth daily.   omeprazole 40 MG capsule Commonly known as: PRILOSEC TAKE 1 CAPSULE(40 MG) BY MOUTH IN THE MORNING AND AT BEDTIME What changed: See the new instructions.   ondansetron 4 MG tablet Commonly known as: ZOFRAN Take 1 tablet (4 mg total) by mouth every 6 (six) hours as  needed for nausea.   Onglyza 2.5 MG Tabs tablet Generic drug: saxagliptin HCl TAKE 1 TABLET(2.5 MG) BY MOUTH DAILY What changed:  how much to take how to take this when to take this additional instructions   oxybutynin 5 MG tablet Commonly known as: DITROPAN Take 5 mg by mouth 2 (two) times daily.   pioglitazone 45 MG tablet Commonly known as: ACTOS Take 1 tablet (45 mg total) by mouth daily.   saccharomyces boulardii 250 MG capsule Commonly known as: FLORASTOR Take 1 capsule (250 mg total) by mouth 2 (two) times daily for 25 days.    simvastatin 20 MG tablet Commonly known as: ZOCOR TAKE 1 TABLET(20 MG) BY MOUTH AT BEDTIME What changed:  how much to take how to take this when to take this additional instructions   tamsulosin 0.4 MG Caps capsule Commonly known as: FLOMAX Take 1 capsule (0.4 mg total) by mouth 2 (two) times daily after a meal. For urinary urgency or weak stream.   traMADol 50 MG tablet Commonly known as: ULTRAM Take 50 mg by mouth in the morning and at bedtime.   Tyler Aas FlexTouch 100 UNIT/ML FlexTouch Pen Generic drug: insulin degludec ADMINISTER 18 UNITS UNDER THE SKIN AT BEDTIME What changed:  how much to take how to take this when to take this additional instructions               Durable Medical Equipment  (From admission, onward)           Start     Ordered   10/29/20 1617  For home use only DME Shower stool  Once       Comments: Shower chair   10/29/20 1616   10/29/20 1616  For home use only DME Shower stool  Once        10/29/20 1615   10/29/20 1615  For home use only DME Walker rolling  Once       Question Answer Comment  Walker: With [place] Wheels   Patient needs a walker to treat with the following condition Weak      10/29/20 1615           Allergies  Allergen Reactions   Ace Inhibitors Cough   Statins Cough    Follow-up Information     [facility].   Specialty: Home Health Services Contact information: [place] [address] [phone]         Virl Cagey, MD. Call in 1 week(s).   Specialty: General Surgery Why: please call this physician office if you do not hear from her within 1 week, as you will need outpatient follow up for your gall bladder Contact information: [address] [place] [facility] [address] [phone]                  The results of significant diagnostics from this hospitalization (including imaging, microbiology, ancillary and laboratory) are  listed below for reference.    Significant Diagnostic Studies: DG Chest 1 View  Result Date: 11/01/2020 CLINICAL DATA:  Status post thoracentesis. EXAM: CHEST  1 VIEW COMPARISON:  One view chest x-ray 11/01/2020 at 7:55 a.m. FINDINGS: The heart is enlarged. Right pleural effusion is significantly decreased. No significant pneumothorax is present. Right-sided chest tube remains in place. Right basilar airspace disease again noted. IMPRESSION: 1. Significant decrease in right pleural effusion without pneumothorax. 2. Right-sided chest tube remains in place. 3. Moderate residual pleural fluid remains. 4.  Residual right lower lobe airspace disease. Electronically Signed   By: San Morelle M.D.   On: 11/01/2020 16:36   DG Chest 2 View  Result Date: 11/03/2020 CLINICAL DATA:  Right pleural effusion. Status post thoracentesis on 11/01/2020 EXAM: CHEST - 2 VIEW COMPARISON:  11/01/2020 FINDINGS: Stable heart size. Residual predominantly lateral right pleural effusion appears similar to the post thoracentesis chest x-ray on 11/01/2020. Residual pleural fluid may be partially loculated. Stable atelectasis of the right lower lung. No pneumothorax or pulmonary edema. IMPRESSION: Stable predominantly lateral right pleural effusion compared to the recent post thoracentesis chest x-ray. Stable atelectasis of the right lower lung. Electronically Signed   By: Aletta Edouard M.D.   On: 11/03/2020 08:06   DG Chest 2 View  Result Date: 11/01/2020 CLINICAL DATA:  Pleural effusion EXAM: CHEST - 2 VIEW COMPARISON:  10/15/2020 chest radiograph. FINDINGS: Stable cardiomediastinal silhouette with normal heart size. No pneumothorax. Large right pleural effusion is new from prior radiograph. No left pleural effusion. Near complete right lung atelectasis. Clear left lung. Pigtail catheter overlies the peripheral aspect of the right upper abdomen. IMPRESSION: Large right pleural effusion, new from prior radiograph. Near  complete right lung atelectasis. Electronically Signed   By: Ilona Sorrel M.D.   On: 11/01/2020 10:00   CT Chest W Contrast  Result Date: 10/15/2020 CLINICAL DATA:  Pneumonia, effusion or abscess suspected, xray done; Abdominal pain, acute, nonlocalized EXAM: CT CHEST, ABDOMEN, AND PELVIS WITH CONTRAST TECHNIQUE: Multidetector CT imaging of the chest, abdomen and pelvis was performed following the standard protocol during bolus administration of intravenous contrast. CONTRAST:  80m OMNIPAQUE IOHEXOL 300 MG/ML  SOLN COMPARISON:  Chest radiograph earlier today. Abdominopelvic CT 08/15/2020 FINDINGS: CT CHEST FINDINGS Cardiovascular: Atherosclerosis of the thoracic aorta. Upper normal heart size. There are coronary artery calcifications. No pericardial effusion. Mediastinum/Nodes: No enlarged mediastinal or hilar lymph nodes. Patulous esophagus without wall thickening. No thyroid nodule. Lungs/Pleura: Small bilateral pleural effusions. Associated bibasilar atelectasis. No evidence of pneumonia. Breathing motion artifact limits detailed assessment. The previous 3 mm right lower lobe pulmonary nodule is obscured by atelectasis on the current exam. No findings of pulmonary edema. Musculoskeletal: Small sclerotic focus in the left glenoid, nonspecific. No acute osseous abnormalities. CT ABDOMEN PELVIS FINDINGS Hepatobiliary: The gallbladder is distended. Suspect pericholecystic fat stranding, however there is motion artifact through the gallbladder. There is mild generalized fat stranding in the right upper quadrant. Abnormal appearance of the liver adjacent to the gallbladder fossa with lobulated low-density that extends up to 12 mm into the adjacent liver parenchyma for example series 4, image 62. Suspect small subcapsular collection about the inferior liver tip measuring 4 x 1.7 cm, series 2, image 62, with additional small volume perihepatic ascites. Common bile duct is poorly defined, but not definitively  distended. No definitive calcified gallstone. Pancreas: Atrophic. No definite peripancreatic fat stranding. No ductal dilatation. Spleen: Normal in size without focal abnormality. Adrenals/Urinary Tract: No adrenal nodule. There is early excretion of IV contrast in both renal collecting systems. No hydronephrosis. Limited assessment for renal calculi given contrast in the collecting systems. Cyst in the lateral left kidney, similar in appearance to prior. Hyperdense lesion in the mid right kidney is not well-defined on the current exam. Excreted IV contrast in the urinary bladder which is partially distended. Equivocal perivesicular fat stranding. Stomach/Bowel: The stomach is decompressed. There is fat stranding in the right upper quadrant adjacent to the distal stomach and proximal duodenum, detailed assessment is obscured by motion. Motion  limits assessment for wall thickening in this region. There is a duodenal diverticulum. Fluid-filled nondilated distal small bowel. No obstruction. Normal appendix. Small to moderate volume of colonic stool. Left colonic diverticulosis. No diverticulitis. Vascular/Lymphatic: Aortic atherosclerosis. No aortic aneurysm. The portal vein is patent. There is no portal venous or mesenteric gas. Limited assessment for upper abdominal adenopathy due to motion. There is no retroperitoneal or pelvic adenopathy. Reproductive: Brachytherapy seeds in the prostate. Other: Fat stranding and inflammatory changes in the right upper quadrant. Small amount of perihepatic ascites. Questionable subcapsular collection is described. There is no free intra-abdominal air. No abdominal wall hernia. Musculoskeletal: Punctate bone island in the L5, unchanged. Lucent lesion within L1 vertebral body and pedicle is not significantly changed. Mixed density lesion in the right iliac bone near the sacroiliac joint is also stable. IMPRESSION: 1. Small bilateral pleural effusions with bibasilar atelectasis. 2.  Aortic atherosclerosis is well as coronary artery calcifications. 3. Gallbladder distension. There is generalized fat stranding in the right upper quadrant the may be secondary to gallbladder inflammation. Motion artifact limits detailed assessment. Abnormal appearance of the liver and a adjacent to the gallbladder fossa, not seen on prior exams. This may represent extension of inflammatory changes or invasion. Suspected small subcapsular collection at the inferior liver tip measuring 4 x 1.7 cm. Small volume perihepatic ascites. Overall findings suggest complicated cholecystitis. Suggest initial assessment with ultrasound. While MRCP may be considered to further delineate the hepatic biliary changes, given inability to hold still for CT, MRI is not recommended at this time. 4. Colonic diverticulosis without diverticulitis. Additional stable chronic findings as described Aortic Atherosclerosis (ICD10-I70.0). Electronically Signed   By: Keith Rake M.D.   On: 10/15/2020 23:18   CT ABDOMEN PELVIS W CONTRAST  Result Date: 10/30/2020 CLINICAL DATA:  Intra-abdominal abscesses, follow-up assessment EXAM: CT ABDOMEN AND PELVIS WITH CONTRAST TECHNIQUE: Multidetector CT imaging of the abdomen and pelvis was performed using the standard protocol following bolus administration of intravenous contrast. CONTRAST:  24m OMNIPAQUE IOHEXOL 350 MG/ML SOLN COMPARISON:  October 23, 2020. FINDINGS: Lower chest: Lung base assessment includes much of the chest showing enlarging RIGHT-sided effusion and volume loss in the RIGHT chest. There is a drain along the RIGHT subdiaphragmatic region placed into a perihepatic collection with diminished size of the collection compared to the prior exam. Three-vessel coronary artery disease. No substantial pericardial fluid. No acute bony process about the visualized bony thorax. Esophagus mildly patulous. Hepatobiliary: Decreased size of lenticular collections along the RIGHT hepatic margin.  Decreased size of infrahepatic fluid following drain placement. Area of hypodensity in the RIGHT hepatic lobe superior to the gallbladder fossa now measures 4.2 x 2.5 cm compared to 5.8 x 5.3 cm, contiguous with the gallbladder. Cholecystostomy tube in place decompressing the gallbladder since the prior study. Pancreas: Atrophy of the pancreas. Spleen: Normal spleen. Adrenals/Urinary Tract: Adrenal glands are normal. Symmetric renal enhancement. No hydronephrosis. Urinary bladder with smooth contours. No perinephric stranding. Stomach/Bowel: No acute gastrointestinal process. The appendix is normal. Colonic diverticulosis. Vascular/Lymphatic: Aortic atherosclerosis without sign of aneurysm. Smooth contour of the IVC. Patent abdominal vessels. There is no gastrohepatic or hepatoduodenal ligament lymphadenopathy. No retroperitoneal or mesenteric lymphadenopathy. No pelvic sidewall lymphadenopathy. Reproductive: Brachytherapy seeds present in the prostate. Other: No free intraperitoneal air. No substantial ascites with minimal fluid remaining in the gallbladder fossa and along the hepatic margin. No abdominal abscess elsewhere. Musculoskeletal: Spinal degenerative changes without acute or destructive bone process. IMPRESSION: Marked enlargement of RIGHT-sided pleural fluid collection with  basilar and generalized volume loss throughout the RIGHT lung since the previous study. Superior perihepatic drain may traverse the now expanded pleural space. While pleural fluid in the RIGHT chest may be reactive, the possibility of chest involvement from abdominal infection is considered based on the above constellation of findings. Fluid sampling and/or drainage as warranted is suggested. Diminished size of intrahepatic pericholecystic abscess with decompression of the gallbladder and near complete resolution of perihepatic fluid. Aortic Atherosclerosis (ICD10-I70.0). These results will be called to the ordering clinician or  representative by the Radiologist Assistant, and communication documented in the PACS or [employer]. Electronically Signed   By: Zetta Bills M.D.   On: 10/30/2020 15:08   CT Abdomen Pelvis W Contrast  Result Date: 10/23/2020 CLINICAL DATA:  Complicated cholecystitis. EXAM: CT ABDOMEN AND PELVIS WITH CONTRAST TECHNIQUE: Multidetector CT imaging of the abdomen and pelvis was performed using the standard protocol following bolus administration of intravenous contrast. CONTRAST:  64m OMNIPAQUE IOHEXOL 350 MG/ML SOLN COMPARISON:  Nuclear medicine hepatobiliary study of 10/23/2020. Abdominal ultrasound 10/22/2020. Most recent CT 10/15/2020. FINDINGS: Lower chest: volume loss in the anterior right lung base. Right lower lobe collapse/consolidative change. Moderate right pleural effusion, increased. Tiny left pleural effusion, similar. Mild cardiomegaly with 3 vessel coronary artery calcification. Hepatobiliary: Slight improvement in gallbladder distension with wall thickening. Improved pericholecystic edema. No calcified stone. No biliary duct dilatation. The perihepatic complex hepatic lesion measures 5.8 x 5.3 cm on 22/3 versus 5.6 x 5.6 cm on the prior exam (when remeasured). A perihepatic 3.3 cm fluid collection on 32/3 is similar to on the prior exam (when remeasured). More caudal perihepatic collection is new at 7.6 x 4.2 cm on 35/3. Lateral right perihepatic space collection or collections at the site of trace ascites on the prior exam. These may all be contiguous, with the largest collection measuring 8.1 x 2.3 cm on 18/3. Pancreas: Fatty replacement through the pancreatic body. No duct dilatation or acute inflammation. Spleen: Normal in size, without focal abnormality. Adrenals/Urinary Tract: Normal adrenal glands. Mild renal cortical thinning bilaterally. 1.4 cm left renal cyst. The bladder is thick walled with mild pericystic edema on 87/3. Stomach/Bowel: Proximal gastric underdistention.  Descending duodenal diverticulum. Otherwise normal small bowel. Extensive colonic diverticulosis. Colonic stool burden suggests constipation. Normal terminal ileum. The appendix is retrocecal, without surrounding inflammation. Vascular/Lymphatic: Separate common hepatic and splenic artery origins. Aortic atherosclerosis. No abdominopelvic adenopathy. Reproductive: Radiation seeds in the prostate. Other: No significant free fluid. No free intraperitoneal air. Anasarca, specially about the low chest. Musculoskeletal: No acute osseous abnormality. Lower thoracic spondylosis. IMPRESSION: 1. Decrease in pericholecystic edema, suggesting slightly improved cholecystitis. Complex intraparenchymal pericholecystic liver lesion is relatively similar in size, most likely abscess. Multiple capsular based fluid collections are new since the prior. One collection is unchanged. 2. Bladder wall thickening, suspicious for cystitis. This is likely accentuated by underdistention. 3. Increase in moderate right pleural effusion with adjacent consolidation which could represent atelectasis or infection. Trace left pleural fluid is not significantly changed. 4. Coronary artery atherosclerosis. Aortic Atherosclerosis (ICD10-I70.0). 5.  Possible constipation. Electronically Signed   By: KAbigail MiyamotoM.D.   On: 10/23/2020 17:39   CT Abdomen Pelvis W Contrast  Result Date: 10/15/2020 CLINICAL DATA:  Pneumonia, effusion or abscess suspected, xray done; Abdominal pain, acute, nonlocalized EXAM: CT CHEST, ABDOMEN, AND PELVIS WITH CONTRAST TECHNIQUE: Multidetector CT imaging of the chest, abdomen and pelvis was performed following the standard protocol during bolus administration of intravenous contrast. CONTRAST:  759mOMNIPAQUE  IOHEXOL 300 MG/ML  SOLN COMPARISON:  Chest radiograph earlier today. Abdominopelvic CT 08/15/2020 FINDINGS: CT CHEST FINDINGS Cardiovascular: Atherosclerosis of the thoracic aorta. Upper normal heart size. There are  coronary artery calcifications. No pericardial effusion. Mediastinum/Nodes: No enlarged mediastinal or hilar lymph nodes. Patulous esophagus without wall thickening. No thyroid nodule. Lungs/Pleura: Small bilateral pleural effusions. Associated bibasilar atelectasis. No evidence of pneumonia. Breathing motion artifact limits detailed assessment. The previous 3 mm right lower lobe pulmonary nodule is obscured by atelectasis on the current exam. No findings of pulmonary edema. Musculoskeletal: Small sclerotic focus in the left glenoid, nonspecific. No acute osseous abnormalities. CT ABDOMEN PELVIS FINDINGS Hepatobiliary: The gallbladder is distended. Suspect pericholecystic fat stranding, however there is motion artifact through the gallbladder. There is mild generalized fat stranding in the right upper quadrant. Abnormal appearance of the liver adjacent to the gallbladder fossa with lobulated low-density that extends up to 12 mm into the adjacent liver parenchyma for example series 4, image 62. Suspect small subcapsular collection about the inferior liver tip measuring 4 x 1.7 cm, series 2, image 62, with additional small volume perihepatic ascites. Common bile duct is poorly defined, but not definitively distended. No definitive calcified gallstone. Pancreas: Atrophic. No definite peripancreatic fat stranding. No ductal dilatation. Spleen: Normal in size without focal abnormality. Adrenals/Urinary Tract: No adrenal nodule. There is early excretion of IV contrast in both renal collecting systems. No hydronephrosis. Limited assessment for renal calculi given contrast in the collecting systems. Cyst in the lateral left kidney, similar in appearance to prior. Hyperdense lesion in the mid right kidney is not well-defined on the current exam. Excreted IV contrast in the urinary bladder which is partially distended. Equivocal perivesicular fat stranding. Stomach/Bowel: The stomach is decompressed. There is fat stranding in  the right upper quadrant adjacent to the distal stomach and proximal duodenum, detailed assessment is obscured by motion. Motion limits assessment for wall thickening in this region. There is a duodenal diverticulum. Fluid-filled nondilated distal small bowel. No obstruction. Normal appendix. Small to moderate volume of colonic stool. Left colonic diverticulosis. No diverticulitis. Vascular/Lymphatic: Aortic atherosclerosis. No aortic aneurysm. The portal vein is patent. There is no portal venous or mesenteric gas. Limited assessment for upper abdominal adenopathy due to motion. There is no retroperitoneal or pelvic adenopathy. Reproductive: Brachytherapy seeds in the prostate. Other: Fat stranding and inflammatory changes in the right upper quadrant. Small amount of perihepatic ascites. Questionable subcapsular collection is described. There is no free intra-abdominal air. No abdominal wall hernia. Musculoskeletal: Punctate bone island in the L5, unchanged. Lucent lesion within L1 vertebral body and pedicle is not significantly changed. Mixed density lesion in the right iliac bone near the sacroiliac joint is also stable. IMPRESSION: 1. Small bilateral pleural effusions with bibasilar atelectasis. 2. Aortic atherosclerosis is well as coronary artery calcifications. 3. Gallbladder distension. There is generalized fat stranding in the right upper quadrant the may be secondary to gallbladder inflammation. Motion artifact limits detailed assessment. Abnormal appearance of the liver and a adjacent to the gallbladder fossa, not seen on prior exams. This may represent extension of inflammatory changes or invasion. Suspected small subcapsular collection at the inferior liver tip measuring 4 x 1.7 cm. Small volume perihepatic ascites. Overall findings suggest complicated cholecystitis. Suggest initial assessment with ultrasound. While MRCP may be considered to further delineate the hepatic biliary changes, given inability  to hold still for CT, MRI is not recommended at this time. 4. Colonic diverticulosis without diverticulitis. Additional stable chronic findings as described Aortic  Atherosclerosis (ICD10-I70.0). Electronically Signed   By: Keith Rake M.D.   On: 10/15/2020 23:18   IR Guided Drain W Catheter Placement  Result Date: 10/24/2020 INDICATION: Acute cholecystitis, worsening perihepatic fluid collections and intrahepatic fluid collections near the gallbladder fossa. EXAM: 1. PERCUTANEOUS CHOLECYSTOSTOMY TUBE PLACEMENT WITH ULTRASOUND AND FLUOROSCOPIC GUIDANCE 2. PERCUTANEOUS CATHETER DRAINAGE OF INFRAHEPATIC ABSCESS WITH ULTRASOUND AND FLUOROSCOPIC GUIDANCE 3. PERCUTANEOUS CATHETER DRAINAGE SUPERIOR PERIHEPATIC ABSCESS WITH ULTRASOUND AND FLUOROSCOPIC GUIDANCE 4. PERCUTANEOUS NEEDLE ASPIRATION OF HEPATIC ABSCESSES WITH ULTRASOUND GUIDANCE MEDICATIONS: None ANESTHESIA/SEDATION: Moderate (conscious) sedation was employed during this procedure. A total of Versed 3.0 mg and Fentanyl 150 mcg was administered intravenously. Moderate Sedation Time: 47 minutes. The patient's level of consciousness and vital signs were monitored continuously by radiology nursing throughout the procedure under my direct supervision. FLUOROSCOPY TIME:  Fluoroscopy Time: 1.0 minutes.  74 mGy. CONTRAST:  10 mL Omnipaque [id number] COMPLICATIONS: None immediate. PROCEDURE: Informed written consent was obtained from the patient after a thorough discussion of the procedural risks, benefits and alternatives. All questions were addressed. Maximal Sterile Barrier Technique was utilized including caps, mask, sterile gowns, sterile gloves, sterile drape, hand hygiene and skin antiseptic. A timeout was performed prior to the initiation of the procedure. Initial ultrasound was performed to localize the gallbladder, perihepatic fluid collections and intrahepatic fluid collections. Under ultrasound guidance, a 21 gauge needle was advanced into the gallbladder  lumen via a transhepatic approach. After confirming needle tip position, a guidewire was advanced into the gallbladder lumen and a transitional dilator advanced over the wire. The percutaneous tract was dilated over a guidewire and a 10 [place] multipurpose drainage catheter advanced into the gallbladder lumen. Contrast was injected via the drain to confirm position within the gallbladder lumen and the cholecystostomy tube was attached to a gravity drainage bag. Fluid collection just inferior to the tip of the right lobe of the liver was then addressed with advancement of an 18 gauge trocar needle under ultrasound guidance. Aspiration of fluid was performed followed by guidewire advancement, percutaneous tract dilatation and placement of a 10 French drainage catheter. Catheter position was confirmed by fluoroscopy. A fluid sample was withdrawn and sent for culture analysis. The drainage catheter was attached to suction bulb drainage. A separate superior perihepatic fluid collection was then addressed with advancement of an 18 gauge trocar needle under ultrasound guidance. Aspiration of fluid was performed followed by guidewire advancement, percutaneous tract dilatation and placement of a 10 French drainage catheter. Catheter position was confirmed by fluoroscopy. A fluid sample was withdrawn and sent for culture analysis. The drainage catheter was attached to suction bulb drainage. An 18 gauge trocar needle was advanced under ultrasound guidance into the right lobe of the liver adjacent to the gallbladder fossa. Aspiration of several small focal adjacent fluid collections were performed under ultrasound guidance. A fluid sample was sent for culture analysis. The percutaneous cholecystostomy tube and additional 2 separate perihepatic drainage catheters were then secured at the skin with Prolene retention sutures and StatLock devices. FINDINGS: After placement of the cholecystostomy tube, there is return bile. Fluid  aspiration of the inferior perihepatic fluid collection yielded fairly clear appearing bilious fluid. A drainage catheter was placed into this collection given size of the collection as well as somewhat limited initial fluid return from the 18 gauge needle. Fluid aspiration of the superior perihepatic fluid collection yielded turbid appearing bilious fluid. A drainage catheter was placed given turbid nature of fluid. Aspiration at the level of small fluid collections adjacent to  the gallbladder within the liver yielded a small amount of bloody and bilious fluid. A total of approximately 2-3 mL of fluid was able to be aspirated from these small collections and sent for culture analysis. No dominant abscess in the liver is identified to warrant additional drain placement within the liver. IMPRESSION: 1. Placement of 10 French percutaneous cholecystostomy tube to treat acute cholecystitis. The cholecystostomy tube was attached to gravity bag drainage. 2. Percutaneous catheter drainage infra hepatic fluid collection yielding bilious fluid. A 10 French drain was placed and attached to suction bulb drainage. A sample of bilious fluid was sent for culture analysis. 3. Percutaneous catheter drainage of superior perihepatic fluid collection yielding turbid bilious fluid. A 10 French drain was placed and attached to suction bulb drainage. A sample of bilious fluid was sent for culture analysis. 4. Ultrasound-guided aspiration of small intrahepatic fluid collections adjacent to the gallbladder fossa. This yielded bloody and bilious fluid with a 2-3 mL sample sent for culture analysis. Electronically Signed   By: Aletta Edouard M.D.   On: 10/24/2020 17:49   IR Guided Drain W Catheter Placement  Result Date: 10/24/2020 INDICATION: Acute cholecystitis, worsening perihepatic fluid collections and intrahepatic fluid collections near the gallbladder fossa. EXAM: 1. PERCUTANEOUS CHOLECYSTOSTOMY TUBE PLACEMENT WITH ULTRASOUND AND  FLUOROSCOPIC GUIDANCE 2. PERCUTANEOUS CATHETER DRAINAGE OF INFRAHEPATIC ABSCESS WITH ULTRASOUND AND FLUOROSCOPIC GUIDANCE 3. PERCUTANEOUS CATHETER DRAINAGE SUPERIOR PERIHEPATIC ABSCESS WITH ULTRASOUND AND FLUOROSCOPIC GUIDANCE 4. PERCUTANEOUS NEEDLE ASPIRATION OF HEPATIC ABSCESSES WITH ULTRASOUND GUIDANCE MEDICATIONS: None ANESTHESIA/SEDATION: Moderate (conscious) sedation was employed during this procedure. A total of Versed 3.0 mg and Fentanyl 150 mcg was administered intravenously. Moderate Sedation Time: 47 minutes. The patient's level of consciousness and vital signs were monitored continuously by radiology nursing throughout the procedure under my direct supervision. FLUOROSCOPY TIME:  Fluoroscopy Time: 1.0 minutes.  74 mGy. CONTRAST:  10 mL Omnipaque [id number] COMPLICATIONS: None immediate. PROCEDURE: Informed written consent was obtained from the patient after a thorough discussion of the procedural risks, benefits and alternatives. All questions were addressed. Maximal Sterile Barrier Technique was utilized including caps, mask, sterile gowns, sterile gloves, sterile drape, hand hygiene and skin antiseptic. A timeout was performed prior to the initiation of the procedure. Initial ultrasound was performed to localize the gallbladder, perihepatic fluid collections and intrahepatic fluid collections. Under ultrasound guidance, a 21 gauge needle was advanced into the gallbladder lumen via a transhepatic approach. After confirming needle tip position, a guidewire was advanced into the gallbladder lumen and a transitional dilator advanced over the wire. The percutaneous tract was dilated over a guidewire and a 10 [place] multipurpose drainage catheter advanced into the gallbladder lumen. Contrast was injected via the drain to confirm position within the gallbladder lumen and the cholecystostomy tube was attached to a gravity drainage bag. Fluid collection just inferior to the tip of the right lobe of the liver was then  addressed with advancement of an 18 gauge trocar needle under ultrasound guidance. Aspiration of fluid was performed followed by guidewire advancement, percutaneous tract dilatation and placement of a 10 French drainage catheter. Catheter position was confirmed by fluoroscopy. A fluid sample was withdrawn and sent for culture analysis. The drainage catheter was attached to suction bulb drainage. A separate superior perihepatic fluid collection was then addressed with advancement of an 18 gauge trocar needle under ultrasound guidance. Aspiration of fluid was performed followed by guidewire advancement, percutaneous tract dilatation and placement of a 10 French drainage catheter. Catheter position was confirmed by fluoroscopy. A fluid  sample was withdrawn and sent for culture analysis. The drainage catheter was attached to suction bulb drainage. An 18 gauge trocar needle was advanced under ultrasound guidance into the right lobe of the liver adjacent to the gallbladder fossa. Aspiration of several small focal adjacent fluid collections were performed under ultrasound guidance. A fluid sample was sent for culture analysis. The percutaneous cholecystostomy tube and additional 2 separate perihepatic drainage catheters were then secured at the skin with Prolene retention sutures and StatLock devices. FINDINGS: After placement of the cholecystostomy tube, there is return bile. Fluid aspiration of the inferior perihepatic fluid collection yielded fairly clear appearing bilious fluid. A drainage catheter was placed into this collection given size of the collection as well as somewhat limited initial fluid return from the 18 gauge needle. Fluid aspiration of the superior perihepatic fluid collection yielded turbid appearing bilious fluid. A drainage catheter was placed given turbid nature of fluid. Aspiration at the level of small fluid collections adjacent to the gallbladder within the liver yielded a small amount of bloody  and bilious fluid. A total of approximately 2-3 mL of fluid was able to be aspirated from these small collections and sent for culture analysis. No dominant abscess in the liver is identified to warrant additional drain placement within the liver. IMPRESSION: 1. Placement of 10 French percutaneous cholecystostomy tube to treat acute cholecystitis. The cholecystostomy tube was attached to gravity bag drainage. 2. Percutaneous catheter drainage infra hepatic fluid collection yielding bilious fluid. A 10 French drain was placed and attached to suction bulb drainage. A sample of bilious fluid was sent for culture analysis. 3. Percutaneous catheter drainage of superior perihepatic fluid collection yielding turbid bilious fluid. A 10 French drain was placed and attached to suction bulb drainage. A sample of bilious fluid was sent for culture analysis. 4. Ultrasound-guided aspiration of small intrahepatic fluid collections adjacent to the gallbladder fossa. This yielded bloody and bilious fluid with a 2-3 mL sample sent for culture analysis. Electronically Signed   By: Aletta Edouard M.D.   On: 10/24/2020 17:49   NM Hepato W/EF  Result Date: 10/23/2020 CLINICAL DATA:  Concern for cholecystitis. EXAM: NUCLEAR MEDICINE HEPATOBILIARY IMAGING TECHNIQUE: Sequential images of the abdomen were obtained out to 60 minutes following intravenous administration of radiopharmaceutical. RADIOPHARMACEUTICALS:  5.3 mCi Tc-62m Choletec IV COMPARISON:  CT scan 10/15/2020 FINDINGS: Prompt clearance radiotracer from blood pool and homogeneous uptake in liver. Counts are evident in the common bile duct by 20 minutes. Counts continue to fill the small bowel. The gallbladder is not identified at 60 minutes. 3 mg of IV morphine was administered to augment filling of the gallbladder. No filling of the gallbladder following morphine administration. IMPRESSION: Non filling of the gallbladder is consistent with acute cholecystitis. These  results will be called to the ordering clinician or representative by the Radiologist Assistant, and communication documented in the PACS or [employer] Electronically Signed   By: SSuzy BouchardM.D.   On: 10/23/2020 13:11   IR Perc Cholecystostomy  Result Date: 10/24/2020 INDICATION: Acute cholecystitis, worsening perihepatic fluid collections and intrahepatic fluid collections near the gallbladder fossa. EXAM: 1. PERCUTANEOUS CHOLECYSTOSTOMY TUBE PLACEMENT WITH ULTRASOUND AND FLUOROSCOPIC GUIDANCE 2. PERCUTANEOUS CATHETER DRAINAGE OF INFRAHEPATIC ABSCESS WITH ULTRASOUND AND FLUOROSCOPIC GUIDANCE 3. PERCUTANEOUS CATHETER DRAINAGE SUPERIOR PERIHEPATIC ABSCESS WITH ULTRASOUND AND FLUOROSCOPIC GUIDANCE 4. PERCUTANEOUS NEEDLE ASPIRATION OF HEPATIC ABSCESSES WITH ULTRASOUND GUIDANCE MEDICATIONS: None ANESTHESIA/SEDATION: Moderate (conscious) sedation was employed during this procedure. A total of Versed 3.0 mg and Fentanyl  150 mcg was administered intravenously. Moderate Sedation Time: 47 minutes. The patient's level of consciousness and vital signs were monitored continuously by radiology nursing throughout the procedure under my direct supervision. FLUOROSCOPY TIME:  Fluoroscopy Time: 1.0 minutes.  74 mGy. CONTRAST:  10 mL Omnipaque [id number] COMPLICATIONS: None immediate. PROCEDURE: Informed written consent was obtained from the patient after a thorough discussion of the procedural risks, benefits and alternatives. All questions were addressed. Maximal Sterile Barrier Technique was utilized including caps, mask, sterile gowns, sterile gloves, sterile drape, hand hygiene and skin antiseptic. A timeout was performed prior to the initiation of the procedure. Initial ultrasound was performed to localize the gallbladder, perihepatic fluid collections and intrahepatic fluid collections. Under ultrasound guidance, a 21 gauge needle was advanced into the gallbladder lumen via a transhepatic approach. After confirming  needle tip position, a guidewire was advanced into the gallbladder lumen and a transitional dilator advanced over the wire. The percutaneous tract was dilated over a guidewire and a 10 [place] multipurpose drainage catheter advanced into the gallbladder lumen. Contrast was injected via the drain to confirm position within the gallbladder lumen and the cholecystostomy tube was attached to a gravity drainage bag. Fluid collection just inferior to the tip of the right lobe of the liver was then addressed with advancement of an 18 gauge trocar needle under ultrasound guidance. Aspiration of fluid was performed followed by guidewire advancement, percutaneous tract dilatation and placement of a 10 French drainage catheter. Catheter position was confirmed by fluoroscopy. A fluid sample was withdrawn and sent for culture analysis. The drainage catheter was attached to suction bulb drainage. A separate superior perihepatic fluid collection was then addressed with advancement of an 18 gauge trocar needle under ultrasound guidance. Aspiration of fluid was performed followed by guidewire advancement, percutaneous tract dilatation and placement of a 10 French drainage catheter. Catheter position was confirmed by fluoroscopy. A fluid sample was withdrawn and sent for culture analysis. The drainage catheter was attached to suction bulb drainage. An 18 gauge trocar needle was advanced under ultrasound guidance into the right lobe of the liver adjacent to the gallbladder fossa. Aspiration of several small focal adjacent fluid collections were performed under ultrasound guidance. A fluid sample was sent for culture analysis. The percutaneous cholecystostomy tube and additional 2 separate perihepatic drainage catheters were then secured at the skin with Prolene retention sutures and StatLock devices. FINDINGS: After placement of the cholecystostomy tube, there is return bile. Fluid aspiration of the inferior perihepatic fluid  collection yielded fairly clear appearing bilious fluid. A drainage catheter was placed into this collection given size of the collection as well as somewhat limited initial fluid return from the 18 gauge needle. Fluid aspiration of the superior perihepatic fluid collection yielded turbid appearing bilious fluid. A drainage catheter was placed given turbid nature of fluid. Aspiration at the level of small fluid collections adjacent to the gallbladder within the liver yielded a small amount of bloody and bilious fluid. A total of approximately 2-3 mL of fluid was able to be aspirated from these small collections and sent for culture analysis. No dominant abscess in the liver is identified to warrant additional drain placement within the liver. IMPRESSION: 1. Placement of 10 French percutaneous cholecystostomy tube to treat acute cholecystitis. The cholecystostomy tube was attached to gravity bag drainage. 2. Percutaneous catheter drainage infra hepatic fluid collection yielding bilious fluid. A 10 French drain was placed and attached to suction bulb drainage. A sample of bilious fluid was sent for culture analysis.  3. Percutaneous catheter drainage of superior perihepatic fluid collection yielding turbid bilious fluid. A 10 French drain was placed and attached to suction bulb drainage. A sample of bilious fluid was sent for culture analysis. 4. Ultrasound-guided aspiration of small intrahepatic fluid collections adjacent to the gallbladder fossa. This yielded bloody and bilious fluid with a 2-3 mL sample sent for culture analysis. Electronically Signed   By: Aletta Edouard M.D.   On: 10/24/2020 17:49   IR US Guide Bx Asp/Drain  Result Date: 10/24/2020 INDICATION: Acute cholecystitis, worsening perihepatic fluid collections and intrahepatic fluid collections near the gallbladder fossa. EXAM: 1. PERCUTANEOUS CHOLECYSTOSTOMY TUBE PLACEMENT WITH ULTRASOUND AND FLUOROSCOPIC GUIDANCE 2. PERCUTANEOUS CATHETER DRAINAGE  OF INFRAHEPATIC ABSCESS WITH ULTRASOUND AND FLUOROSCOPIC GUIDANCE 3. PERCUTANEOUS CATHETER DRAINAGE SUPERIOR PERIHEPATIC ABSCESS WITH ULTRASOUND AND FLUOROSCOPIC GUIDANCE 4. PERCUTANEOUS NEEDLE ASPIRATION OF HEPATIC ABSCESSES WITH ULTRASOUND GUIDANCE MEDICATIONS: None ANESTHESIA/SEDATION: Moderate (conscious) sedation was employed during this procedure. A total of Versed 3.0 mg and Fentanyl 150 mcg was administered intravenously. Moderate Sedation Time: 47 minutes. The patient's level of consciousness and vital signs were monitored continuously by radiology nursing throughout the procedure under my direct supervision. FLUOROSCOPY TIME:  Fluoroscopy Time: 1.0 minutes.  74 mGy. CONTRAST:  10 mL Omnipaque [id number] COMPLICATIONS: None immediate. PROCEDURE: Informed written consent was obtained from the patient after a thorough discussion of the procedural risks, benefits and alternatives. All questions were addressed. Maximal Sterile Barrier Technique was utilized including caps, mask, sterile gowns, sterile gloves, sterile drape, hand hygiene and skin antiseptic. A timeout was performed prior to the initiation of the procedure. Initial ultrasound was performed to localize the gallbladder, perihepatic fluid collections and intrahepatic fluid collections. Under ultrasound guidance, a 21 gauge needle was advanced into the gallbladder lumen via a transhepatic approach. After confirming needle tip position, a guidewire was advanced into the gallbladder lumen and a transitional dilator advanced over the wire. The percutaneous tract was dilated over a guidewire and a 10 [place] multipurpose drainage catheter advanced into the gallbladder lumen. Contrast was injected via the drain to confirm position within the gallbladder lumen and the cholecystostomy tube was attached to a gravity drainage bag. Fluid collection just inferior to the tip of the right lobe of the liver was then addressed with advancement of an 18 gauge trocar needle  under ultrasound guidance. Aspiration of fluid was performed followed by guidewire advancement, percutaneous tract dilatation and placement of a 10 French drainage catheter. Catheter position was confirmed by fluoroscopy. A fluid sample was withdrawn and sent for culture analysis. The drainage catheter was attached to suction bulb drainage. A separate superior perihepatic fluid collection was then addressed with advancement of an 18 gauge trocar needle under ultrasound guidance. Aspiration of fluid was performed followed by guidewire advancement, percutaneous tract dilatation and placement of a 10 French drainage catheter. Catheter position was confirmed by fluoroscopy. A fluid sample was withdrawn and sent for culture analysis. The drainage catheter was attached to suction bulb drainage. An 18 gauge trocar needle was advanced under ultrasound guidance into the right lobe of the liver adjacent to the gallbladder fossa. Aspiration of several small focal adjacent fluid collections were performed under ultrasound guidance. A fluid sample was sent for culture analysis. The percutaneous cholecystostomy tube and additional 2 separate perihepatic drainage catheters were then secured at the skin with Prolene retention sutures and StatLock devices. FINDINGS: After placement of the cholecystostomy tube, there is return bile. Fluid aspiration of the inferior perihepatic fluid collection yielded fairly clear appearing bilious  fluid. A drainage catheter was placed into this collection given size of the collection as well as somewhat limited initial fluid return from the 18 gauge needle. Fluid aspiration of the superior perihepatic fluid collection yielded turbid appearing bilious fluid. A drainage catheter was placed given turbid nature of fluid. Aspiration at the level of small fluid collections adjacent to the gallbladder within the liver yielded a small amount of bloody and bilious fluid. A total of approximately 2-3 mL of  fluid was able to be aspirated from these small collections and sent for culture analysis. No dominant abscess in the liver is identified to warrant additional drain placement within the liver. IMPRESSION: 1. Placement of 10 French percutaneous cholecystostomy tube to treat acute cholecystitis. The cholecystostomy tube was attached to gravity bag drainage. 2. Percutaneous catheter drainage infra hepatic fluid collection yielding bilious fluid. A 10 French drain was placed and attached to suction bulb drainage. A sample of bilious fluid was sent for culture analysis. 3. Percutaneous catheter drainage of superior perihepatic fluid collection yielding turbid bilious fluid. A 10 French drain was placed and attached to suction bulb drainage. A sample of bilious fluid was sent for culture analysis. 4. Ultrasound-guided aspiration of small intrahepatic fluid collections adjacent to the gallbladder fossa. This yielded bloody and bilious fluid with a 2-3 mL sample sent for culture analysis. Electronically Signed   By: Aletta Edouard M.D.   On: 10/24/2020 17:49   IR US Guide Bx Asp/Drain  Result Date: 10/24/2020 INDICATION: Acute cholecystitis, worsening perihepatic fluid collections and intrahepatic fluid collections near the gallbladder fossa. EXAM: 1. PERCUTANEOUS CHOLECYSTOSTOMY TUBE PLACEMENT WITH ULTRASOUND AND FLUOROSCOPIC GUIDANCE 2. PERCUTANEOUS CATHETER DRAINAGE OF INFRAHEPATIC ABSCESS WITH ULTRASOUND AND FLUOROSCOPIC GUIDANCE 3. PERCUTANEOUS CATHETER DRAINAGE SUPERIOR PERIHEPATIC ABSCESS WITH ULTRASOUND AND FLUOROSCOPIC GUIDANCE 4. PERCUTANEOUS NEEDLE ASPIRATION OF HEPATIC ABSCESSES WITH ULTRASOUND GUIDANCE MEDICATIONS: None ANESTHESIA/SEDATION: Moderate (conscious) sedation was employed during this procedure. A total of Versed 3.0 mg and Fentanyl 150 mcg was administered intravenously. Moderate Sedation Time: 47 minutes. The patient's level of consciousness and vital signs were monitored continuously by  radiology nursing throughout the procedure under my direct supervision. FLUOROSCOPY TIME:  Fluoroscopy Time: 1.0 minutes.  74 mGy. CONTRAST:  10 mL Omnipaque [id number] COMPLICATIONS: None immediate. PROCEDURE: Informed written consent was obtained from the patient after a thorough discussion of the procedural risks, benefits and alternatives. All questions were addressed. Maximal Sterile Barrier Technique was utilized including caps, mask, sterile gowns, sterile gloves, sterile drape, hand hygiene and skin antiseptic. A timeout was performed prior to the initiation of the procedure. Initial ultrasound was performed to localize the gallbladder, perihepatic fluid collections and intrahepatic fluid collections. Under ultrasound guidance, a 21 gauge needle was advanced into the gallbladder lumen via a transhepatic approach. After confirming needle tip position, a guidewire was advanced into the gallbladder lumen and a transitional dilator advanced over the wire. The percutaneous tract was dilated over a guidewire and a 10 [place] multipurpose drainage catheter advanced into the gallbladder lumen. Contrast was injected via the drain to confirm position within the gallbladder lumen and the cholecystostomy tube was attached to a gravity drainage bag. Fluid collection just inferior to the tip of the right lobe of the liver was then addressed with advancement of an 18 gauge trocar needle under ultrasound guidance. Aspiration of fluid was performed followed by guidewire advancement, percutaneous tract dilatation and placement of a 10 French drainage catheter. Catheter position was confirmed by fluoroscopy. A fluid sample was withdrawn and sent for  culture analysis. The drainage catheter was attached to suction bulb drainage. A separate superior perihepatic fluid collection was then addressed with advancement of an 18 gauge trocar needle under ultrasound guidance. Aspiration of fluid was performed followed by guidewire advancement,  percutaneous tract dilatation and placement of a 10 French drainage catheter. Catheter position was confirmed by fluoroscopy. A fluid sample was withdrawn and sent for culture analysis. The drainage catheter was attached to suction bulb drainage. An 18 gauge trocar needle was advanced under ultrasound guidance into the right lobe of the liver adjacent to the gallbladder fossa. Aspiration of several small focal adjacent fluid collections were performed under ultrasound guidance. A fluid sample was sent for culture analysis. The percutaneous cholecystostomy tube and additional 2 separate perihepatic drainage catheters were then secured at the skin with Prolene retention sutures and StatLock devices. FINDINGS: After placement of the cholecystostomy tube, there is return bile. Fluid aspiration of the inferior perihepatic fluid collection yielded fairly clear appearing bilious fluid. A drainage catheter was placed into this collection given size of the collection as well as somewhat limited initial fluid return from the 18 gauge needle. Fluid aspiration of the superior perihepatic fluid collection yielded turbid appearing bilious fluid. A drainage catheter was placed given turbid nature of fluid. Aspiration at the level of small fluid collections adjacent to the gallbladder within the liver yielded a small amount of bloody and bilious fluid. A total of approximately 2-3 mL of fluid was able to be aspirated from these small collections and sent for culture analysis. No dominant abscess in the liver is identified to warrant additional drain placement within the liver. IMPRESSION: 1. Placement of 10 French percutaneous cholecystostomy tube to treat acute cholecystitis. The cholecystostomy tube was attached to gravity bag drainage. 2. Percutaneous catheter drainage infra hepatic fluid collection yielding bilious fluid. A 10 French drain was placed and attached to suction bulb drainage. A sample of bilious fluid was sent for  culture analysis. 3. Percutaneous catheter drainage of superior perihepatic fluid collection yielding turbid bilious fluid. A 10 French drain was placed and attached to suction bulb drainage. A sample of bilious fluid was sent for culture analysis. 4. Ultrasound-guided aspiration of small intrahepatic fluid collections adjacent to the gallbladder fossa. This yielded bloody and bilious fluid with a 2-3 mL sample sent for culture analysis. Electronically Signed   By: Aletta Edouard M.D.   On: 10/24/2020 17:49   IR US Guide Bx Asp/Drain  Result Date: 10/24/2020 INDICATION: Acute cholecystitis, worsening perihepatic fluid collections and intrahepatic fluid collections near the gallbladder fossa. EXAM: 1. PERCUTANEOUS CHOLECYSTOSTOMY TUBE PLACEMENT WITH ULTRASOUND AND FLUOROSCOPIC GUIDANCE 2. PERCUTANEOUS CATHETER DRAINAGE OF INFRAHEPATIC ABSCESS WITH ULTRASOUND AND FLUOROSCOPIC GUIDANCE 3. PERCUTANEOUS CATHETER DRAINAGE SUPERIOR PERIHEPATIC ABSCESS WITH ULTRASOUND AND FLUOROSCOPIC GUIDANCE 4. PERCUTANEOUS NEEDLE ASPIRATION OF HEPATIC ABSCESSES WITH ULTRASOUND GUIDANCE MEDICATIONS: None ANESTHESIA/SEDATION: Moderate (conscious) sedation was employed during this procedure. A total of Versed 3.0 mg and Fentanyl 150 mcg was administered intravenously. Moderate Sedation Time: 47 minutes. The patient's level of consciousness and vital signs were monitored continuously by radiology nursing throughout the procedure under my direct supervision. FLUOROSCOPY TIME:  Fluoroscopy Time: 1.0 minutes.  74 mGy. CONTRAST:  10 mL Omnipaque [id number] COMPLICATIONS: None immediate. PROCEDURE: Informed written consent was obtained from the patient after a thorough discussion of the procedural risks, benefits and alternatives. All questions were addressed. Maximal Sterile Barrier Technique was utilized including caps, mask, sterile gowns, sterile gloves, sterile drape, hand hygiene and skin antiseptic. A timeout was  performed prior to the  initiation of the procedure. Initial ultrasound was performed to localize the gallbladder, perihepatic fluid collections and intrahepatic fluid collections. Under ultrasound guidance, a 21 gauge needle was advanced into the gallbladder lumen via a transhepatic approach. After confirming needle tip position, a guidewire was advanced into the gallbladder lumen and a transitional dilator advanced over the wire. The percutaneous tract was dilated over a guidewire and a 10 [place] multipurpose drainage catheter advanced into the gallbladder lumen. Contrast was injected via the drain to confirm position within the gallbladder lumen and the cholecystostomy tube was attached to a gravity drainage bag. Fluid collection just inferior to the tip of the right lobe of the liver was then addressed with advancement of an 18 gauge trocar needle under ultrasound guidance. Aspiration of fluid was performed followed by guidewire advancement, percutaneous tract dilatation and placement of a 10 French drainage catheter. Catheter position was confirmed by fluoroscopy. A fluid sample was withdrawn and sent for culture analysis. The drainage catheter was attached to suction bulb drainage. A separate superior perihepatic fluid collection was then addressed with advancement of an 18 gauge trocar needle under ultrasound guidance. Aspiration of fluid was performed followed by guidewire advancement, percutaneous tract dilatation and placement of a 10 French drainage catheter. Catheter position was confirmed by fluoroscopy. A fluid sample was withdrawn and sent for culture analysis. The drainage catheter was attached to suction bulb drainage. An 18 gauge trocar needle was advanced under ultrasound guidance into the right lobe of the liver adjacent to the gallbladder fossa. Aspiration of several small focal adjacent fluid collections were performed under ultrasound guidance. A fluid sample was sent for culture analysis. The percutaneous  cholecystostomy tube and additional 2 separate perihepatic drainage catheters were then secured at the skin with Prolene retention sutures and StatLock devices. FINDINGS: After placement of the cholecystostomy tube, there is return bile. Fluid aspiration of the inferior perihepatic fluid collection yielded fairly clear appearing bilious fluid. A drainage catheter was placed into this collection given size of the collection as well as somewhat limited initial fluid return from the 18 gauge needle. Fluid aspiration of the superior perihepatic fluid collection yielded turbid appearing bilious fluid. A drainage catheter was placed given turbid nature of fluid. Aspiration at the level of small fluid collections adjacent to the gallbladder within the liver yielded a small amount of bloody and bilious fluid. A total of approximately 2-3 mL of fluid was able to be aspirated from these small collections and sent for culture analysis. No dominant abscess in the liver is identified to warrant additional drain placement within the liver. IMPRESSION: 1. Placement of 10 French percutaneous cholecystostomy tube to treat acute cholecystitis. The cholecystostomy tube was attached to gravity bag drainage. 2. Percutaneous catheter drainage infra hepatic fluid collection yielding bilious fluid. A 10 French drain was placed and attached to suction bulb drainage. A sample of bilious fluid was sent for culture analysis. 3. Percutaneous catheter drainage of superior perihepatic fluid collection yielding turbid bilious fluid. A 10 French drain was placed and attached to suction bulb drainage. A sample of bilious fluid was sent for culture analysis. 4. Ultrasound-guided aspiration of small intrahepatic fluid collections adjacent to the gallbladder fossa. This yielded bloody and bilious fluid with a 2-3 mL sample sent for culture analysis. Electronically Signed   By: Aletta Edouard M.D.   On: 10/24/2020 17:49   IR Fluoro Guide Ndl Plmt /  BX  Result Date: 10/24/2020 INDICATION: Acute cholecystitis, worsening perihepatic fluid  collections and intrahepatic fluid collections near the gallbladder fossa. EXAM: 1. PERCUTANEOUS CHOLECYSTOSTOMY TUBE PLACEMENT WITH ULTRASOUND AND FLUOROSCOPIC GUIDANCE 2. PERCUTANEOUS CATHETER DRAINAGE OF INFRAHEPATIC ABSCESS WITH ULTRASOUND AND FLUOROSCOPIC GUIDANCE 3. PERCUTANEOUS CATHETER DRAINAGE SUPERIOR PERIHEPATIC ABSCESS WITH ULTRASOUND AND FLUOROSCOPIC GUIDANCE 4. PERCUTANEOUS NEEDLE ASPIRATION OF HEPATIC ABSCESSES WITH ULTRASOUND GUIDANCE MEDICATIONS: None ANESTHESIA/SEDATION: Moderate (conscious) sedation was employed during this procedure. A total of Versed 3.0 mg and Fentanyl 150 mcg was administered intravenously. Moderate Sedation Time: 47 minutes. The patient's level of consciousness and vital signs were monitored continuously by radiology nursing throughout the procedure under my direct supervision. FLUOROSCOPY TIME:  Fluoroscopy Time: 1.0 minutes.  74 mGy. CONTRAST:  10 mL Omnipaque [id number] COMPLICATIONS: None immediate. PROCEDURE: Informed written consent was obtained from the patient after a thorough discussion of the procedural risks, benefits and alternatives. All questions were addressed. Maximal Sterile Barrier Technique was utilized including caps, mask, sterile gowns, sterile gloves, sterile drape, hand hygiene and skin antiseptic. A timeout was performed prior to the initiation of the procedure. Initial ultrasound was performed to localize the gallbladder, perihepatic fluid collections and intrahepatic fluid collections. Under ultrasound guidance, a 21 gauge needle was advanced into the gallbladder lumen via a transhepatic approach. After confirming needle tip position, a guidewire was advanced into the gallbladder lumen and a transitional dilator advanced over the wire. The percutaneous tract was dilated over a guidewire and a 10 [place] multipurpose drainage catheter advanced into the gallbladder  lumen. Contrast was injected via the drain to confirm position within the gallbladder lumen and the cholecystostomy tube was attached to a gravity drainage bag. Fluid collection just inferior to the tip of the right lobe of the liver was then addressed with advancement of an 18 gauge trocar needle under ultrasound guidance. Aspiration of fluid was performed followed by guidewire advancement, percutaneous tract dilatation and placement of a 10 French drainage catheter. Catheter position was confirmed by fluoroscopy. A fluid sample was withdrawn and sent for culture analysis. The drainage catheter was attached to suction bulb drainage. A separate superior perihepatic fluid collection was then addressed with advancement of an 18 gauge trocar needle under ultrasound guidance. Aspiration of fluid was performed followed by guidewire advancement, percutaneous tract dilatation and placement of a 10 French drainage catheter. Catheter position was confirmed by fluoroscopy. A fluid sample was withdrawn and sent for culture analysis. The drainage catheter was attached to suction bulb drainage. An 18 gauge trocar needle was advanced under ultrasound guidance into the right lobe of the liver adjacent to the gallbladder fossa. Aspiration of several small focal adjacent fluid collections were performed under ultrasound guidance. A fluid sample was sent for culture analysis. The percutaneous cholecystostomy tube and additional 2 separate perihepatic drainage catheters were then secured at the skin with Prolene retention sutures and StatLock devices. FINDINGS: After placement of the cholecystostomy tube, there is return bile. Fluid aspiration of the inferior perihepatic fluid collection yielded fairly clear appearing bilious fluid. A drainage catheter was placed into this collection given size of the collection as well as somewhat limited initial fluid return from the 18 gauge needle. Fluid aspiration of the superior perihepatic  fluid collection yielded turbid appearing bilious fluid. A drainage catheter was placed given turbid nature of fluid. Aspiration at the level of small fluid collections adjacent to the gallbladder within the liver yielded a small amount of bloody and bilious fluid. A total of approximately 2-3 mL of fluid was able to be aspirated from these small collections and sent for  culture analysis. No dominant abscess in the liver is identified to warrant additional drain placement within the liver. IMPRESSION: 1. Placement of 10 French percutaneous cholecystostomy tube to treat acute cholecystitis. The cholecystostomy tube was attached to gravity bag drainage. 2. Percutaneous catheter drainage infra hepatic fluid collection yielding bilious fluid. A 10 French drain was placed and attached to suction bulb drainage. A sample of bilious fluid was sent for culture analysis. 3. Percutaneous catheter drainage of superior perihepatic fluid collection yielding turbid bilious fluid. A 10 French drain was placed and attached to suction bulb drainage. A sample of bilious fluid was sent for culture analysis. 4. Ultrasound-guided aspiration of small intrahepatic fluid collections adjacent to the gallbladder fossa. This yielded bloody and bilious fluid with a 2-3 mL sample sent for culture analysis. Electronically Signed   By: Aletta Edouard M.D.   On: 10/24/2020 17:49   DG Chest Port 1 View  Result Date: 10/15/2020 CLINICAL DATA:  Fever and altered mental status. EXAM: PORTABLE CHEST 1 VIEW COMPARISON:  Chest radiograph dated 10/05/2018. FINDINGS: Shallow inspiration. Small left pleural effusion and left lung base atelectasis. Infiltrate is not excluded clinical correlation recommended. There is no pneumothorax. The cardiac silhouette is within normal limits. No acute osseous pathology. IMPRESSION: Small left pleural effusion and left lung base atelectasis versus infiltrate. Electronically Signed   By: Anner Crete M.D.   On:  10/15/2020 20:19   ECHOCARDIOGRAM COMPLETE  Result Date: 10/17/2020    ECHOCARDIOGRAM REPORT   Patient Name:   ROSA GUILBAULT Date of Exam: 10/17/2020 Medical Rec #:  [id number]       Height:       75.0 in Accession #:    [id number]      Weight:       278.2 lb Date of Birth:  1945/08/28       BSA:          2.525 m Patient Age:    22 years        BP:           135/73 mmHg Patient Gender: M               HR:           66 bpm. Exam Location:  [facility] Procedure: 2D Echo, Cardiac Doppler and Color Doppler Indications:    Bacteremia  History:        Patient has prior history of Echocardiogram examinations, most                 recent 02/20/2010. Signs/Symptoms:Altered Mental Status and CKD,                 sepsis; Risk Factors:Diabetes, Hypertension, Dyslipidemia and                 Morbid obesity. Acute cholecystitis, pleural effusion.  Sonographer:    Dustin Flock RDCS Referring Phys: [place]  1. Left ventricular ejection fraction, by estimation, is 60 to 65%. The left ventricle has normal function. The left ventricle has no regional wall motion abnormalities. Left ventricular diastolic parameters were normal.  2. RV-RA gradient 38 mmHg suggests at least mildly elevated RVSP. Right ventricular systolic function is normal. The right ventricular size is normal.  3. There is a trivial pericardial effusion that is circumferential.  4. The mitral valve is grossly normal. Mild mitral valve regurgitation.  5. The aortic valve is tricuspid. There is mild calcification of the aortic valve. Aortic  valve regurgitation is not visualized.  6. Unable to estimate CVP.  7. No obvious valvular vegetations. Comparison(s): No prior Echocardiogram. FINDINGS  Left Ventricle: Left ventricular ejection fraction, by estimation, is 60 to 65%. The left ventricle has normal function. The left ventricle has no regional wall motion abnormalities. The left ventricular internal cavity size was normal in size. There is   borderline left ventricular hypertrophy. Left ventricular diastolic parameters were normal. Right Ventricle: RV-RA gradient 38 mmHg suggests at least mildly elevated RVSP. The right ventricular size is normal. No increase in right ventricular wall thickness. Right ventricular systolic function is normal. Left Atrium: Left atrial size was normal in size. Right Atrium: Right atrial size was normal in size. Pericardium: Trivial pericardial effusion is present. The pericardial effusion is circumferential. Mitral Valve: The mitral valve is grossly normal. Mild mitral annular calcification. Mild mitral valve regurgitation. Tricuspid Valve: The tricuspid valve is grossly normal. Tricuspid valve regurgitation is mild. Aortic Valve: The aortic valve is tricuspid. There is mild calcification of the aortic valve. There is mild aortic valve annular calcification. Aortic valve regurgitation is not visualized. Pulmonic Valve: The pulmonic valve was grossly normal. Pulmonic valve regurgitation is not visualized. Aorta: The aortic root is normal in size and structure. Venous: Unable to estimate CVP. The inferior vena cava was not well visualized. IAS/Shunts: No atrial level shunt detected by color flow Doppler.  LEFT VENTRICLE PLAX 2D LVIDd:         5.23 cm  Diastology LVIDs:         2.83 cm  LV e' medial:    6.92 cm/s LV PW:         1.06 cm  LV E/e' medial:  9.3 LV IVS:        1.04 cm  LV e' lateral:   8.32 cm/s LVOT diam:     2.20 cm  LV E/e' lateral: 7.7 LV SV:         101 LV SV Index:   40 LVOT Area:     3.80 cm  RIGHT VENTRICLE RV Basal diam:  3.28 cm RV S prime:     4.35 cm/s TAPSE (M-mode): 2.6 cm LEFT ATRIUM             Index       RIGHT ATRIUM           Index LA diam:        3.70 cm 1.47 cm/m  RA Area:     19.60 cm LA Vol (A2C):   63.1 ml 24.99 ml/m RA Volume:   61.90 ml  24.52 ml/m LA Vol (A4C):   54.2 ml 21.47 ml/m LA Biplane Vol: 59.7 ml 23.64 ml/m  AORTIC VALVE LVOT Vmax:   115.00 cm/s LVOT Vmean:  77.200 cm/s  LVOT VTI:    0.265 m  AORTA Ao Root diam: 3.10 cm MITRAL VALVE               TRICUSPID VALVE MV Area (PHT): 3.99 cm    TR Peak grad:   38.2 mmHg MV Decel Time: 190 msec    TR Vmax:        309.00 cm/s MV E velocity: 64.30 cm/s MV A velocity: 57.00 cm/s  SHUNTS MV E/A ratio:  1.13        Systemic VTI:  0.26 m  Systemic Diam: 2.20 cm Rozann Lesches MD Electronically signed by Rozann Lesches MD Signature Date/Time: 10/17/2020/11:15:33 AM    Final    US Abdomen Limited RUQ (LIVER/GB)  Result Date: 10/22/2020 CLINICAL DATA:  Right upper quadrant abdominal pain EXAM: ULTRASOUND ABDOMEN LIMITED RIGHT UPPER QUADRANT COMPARISON:  10/16/2020, CT 10/15/2020 FINDINGS: Gallbladder: The gallbladder is mildly distended, similar to prior examination. There is mild gallbladder wall thickening and pericholecystic fluid again identified. Multiple pericholecystic fluid collections are identified within the adjacent gallbladder fossa. When compared to prior imaging, altogether, the findings are in keeping with gangrenous cholecystitis. Common bile duct: Diameter: 8 mm in proximal diameter. This is stable when compared to prior CT examination. Liver: As noted above, several small cystic lesions are identified within the gallbladder fossa adjacent to the gallbladder itself most in keeping with small hepatic abscesses in the setting of gangrenous cholecystitis. No other focal intrahepatic masses are identified. No intrahepatic biliary ductal dilation. Hepatic parenchymal echogenicity is within normal limits. There is a subcapsular simple appearing fluid collection within the anterior right hepatic lobe measuring 3.3 x 2.3 x 3.6 cm. A a ovoid subcapsular complex fluid collection is seen subjacent to the right hepatic lobe measuring 7.6 x 3.1 x 5.1 cm on image # [id number] most in keeping with a subhepatic abscess, enlarged in size since prior CT examination. Portal vein is patent on color Doppler imaging with  normal direction of blood flow towards the liver. Other: Small right pleural effusion is present IMPRESSION: Findings is in keeping with a gangrenous cholecystitis, similar to prior examination. Multiple small probable hepatic abscesses within the gallbladder fossa. Enlarging complex fluid collection within the right subhepatic space likely reflecting a enlarging subhepatic abscess given the above findings. 3.6 cm subcapsular simple appearing fluid collection within the anterior right hepatic lobe, new since prior CT examination, of unclear significance. Small right pleural effusion. These results will be called to the ordering clinician or representative by the Radiologist Assistant, and communication documented in the PACS or [employer]. Electronically Signed   By: Fidela Salisbury M.D.   On: 10/22/2020 23:09   US Abdomen Limited RUQ (LIVER/GB)  Result Date: 10/16/2020 CLINICAL DATA:  Acute cholecystitis. EXAM: ULTRASOUND ABDOMEN LIMITED RIGHT UPPER QUADRANT COMPARISON:  CT AP 10/15/2020 FINDINGS: Gallbladder: Gallbladder wall appears thickened. By my measurements the gallbladder measures up to 4 mm. There is mild pericholecystic fluid identified. No gallstones identified. Small volume of sludge noted within the gallbladder. Negative sonographic Murphy's sign reported. Common bile duct: Diameter: 3.5 mm Liver: Small fluid collection adjacent to the gallbladder in the small parenchymal fluid collections within segment 4 of the liver adjacent to the gallbladder are better seen on the CT from yesterday. Portal vein is patent on color Doppler imaging with normal direction of blood flow towards the liver. Other: None. IMPRESSION: 1. Gallbladder wall thickening, sludge and pericholecystic fluid identified. Cannot exclude acalculous cholecystitis. 2. Fluid collection adjacent to the gallbladder and intraparenchymal low-density foci within segment 4 (also adjacent to the gallbladder) are better seen on the  contrast enhanced CT of the abdomen from 10/15/2020. Electronically Signed   By: Kerby Moors M.D.   On: 10/16/2020 11:19   US THORACENTESIS ASP PLEURAL SPACE W/IMG GUIDE  Result Date: 11/01/2020 INDICATION: Patient with history of cholecystitis, hepatic abscess, prostate cancer, chronic kidney disease, right pleural effusion; request received for diagnostic and therapeutic right thoracentesis. EXAM: ULTRASOUND GUIDED DIAGNOSTIC AND THERAPEUTIC RIGHT THORACENTESIS MEDICATIONS: 1% lidocaine to skin and subcutaneous tissue COMPLICATIONS: None immediate. PROCEDURE:  An ultrasound guided thoracentesis was thoroughly discussed with the patient and questions answered. The benefits, risks, alternatives and complications were also discussed. The patient understands and wishes to proceed with the procedure. Written consent was obtained. Ultrasound was performed to localize and mark an adequate pocket of fluid in the right chest. The area was then prepped and draped in the normal sterile fashion. 1% Lidocaine was used for local anesthesia. Under ultrasound guidance a 6 Fr Safe-T-Centesis catheter was introduced. Thoracentesis was performed. The catheter was removed and a dressing applied. FINDINGS: A total of approximately 950 cc of yellow fluid was removed. Samples were sent to the laboratory as requested by the clinical team. Due to patient coughing and chest discomfort only the above amount of fluid was removed today. The pleural fluid collection was multiloculated in nature. IMPRESSION: Successful ultrasound guided diagnostic and therapeutic right thoracentesis yielding 950 cc of pleural fluid. Read by: Rowe Robert, PA-C Electronically Signed   By: Aletta Edouard M.D.   On: 11/01/2020 14:58    Microbiology: Recent Results (from the past 240 hour(s))  Culture, body fluid w Gram Stain-bottle     Status: None (Preliminary result)   Collection Time: 11/01/20  3:05 PM   Specimen: Pleura  Result Value Ref Range  Status   Specimen Description PLEURAL FLUID  Final   Special Requests RIGHT LUNG  Final   Culture   Final    NO GROWTH 3 DAYS Performed at [facility], [address], [place], [place] [address]    Report Status PENDING  Incomplete  Gram stain     Status: None   Collection Time: 11/01/20  3:05 PM   Specimen: Pleura  Result Value Ref Range Status   Specimen Description PLEURAL FLUID  Final   Special Requests RIGHT LUNG  Final   Gram Stain   Final    CYTOSPIN SMEAR WBC PRESENT,BOTH PMN AND MONONUCLEAR NO ORGANISMS SEEN Performed at [facility], [address] [place], [place] [address]    Report Status 11/01/2020 FINAL  Final     Labs: Basic Metabolic Panel: Recent Labs  Lab 10/29/20 0040 10/30/20 0327 10/31/20 0044 11/01/20 0021 11/02/20 0715 11/03/20 0714 11/04/20 0859  NA 130*   < > 126* 125* 127* 132* 132*  K 3.8   < > 3.7 3.8 3.8 3.1* 3.5  CL 97*   < > 91* 90* 93* 91* 94*  CO2 25   < > '24 25 23 28 28  '$ GLUCOSE 167*   < > 157* 153* 169* 136* 150*  BUN 19   < > 25* 31* 33* 27* 22  CREATININE 1.63*   < > 2.44* 3.31* 3.49* 2.79* 2.21*  CALCIUM 7.9*   < > 7.9* 7.8* 7.9* 8.5* 8.5*  MG 1.8  --   --   --   --   --   --   PHOS 3.2  --   --  4.1  --   --  3.4   < > = values in this interval not displayed.   Liver Function Tests: Recent Labs  Lab 10/29/20 0040 10/30/20 0327 10/31/20 0044 11/01/20 0021 11/03/20 0714 11/04/20 0859  AST '16 17 16  '$ --  23  --   ALT '19 20 20  '$ --  23  --   ALKPHOS 54 58 58  --  53  --   BILITOT 0.6 0.8 0.8  --  0.4  --   PROT 5.2* 5.2* 5.3*  --  5.4*  --   ALBUMIN 1.6* 1.6* 1.7* 1.7* 1.7* 1.9*   No results for input(s): LIPASE, AMYLASE in the last 168 hours. No results for input(s): AMMONIA in the last 168 hours. CBC: Recent Labs  Lab 10/31/20 0044 11/01/20 0021 11/02/20 0715 11/03/20 0714 11/04/20 0859  WBC 13.5* 14.1* 17.3* 11.0* 10.2  NEUTROABS 11.5* 12.1*  --  9.5* 8.4*  HGB 9.4* 8.9* 9.3* 8.9* 9.7*  HCT  28.0* 26.7* 27.1* 26.2* 29.6*  MCV 94.0 93.7 93.1 92.9 95.2  PLT 282 274 255 267 295   Cardiac Enzymes: No results for input(s): CKTOTAL, CKMB, CKMBINDEX, TROPONINI in the last 168 hours. BNP: BNP (last 3 results) No results for input(s): BNP in the last 8760 hours.  ProBNP (last 3 results) No results for input(s): PROBNP in the last 8760 hours.  CBG: Recent Labs  Lab 11/03/20 1201 11/03/20 1653 11/03/20 2107 11/04/20 0820 11/04/20 1221  GLUCAP 153* 181* 154* 142* 211*       Signed:  Nita Sells MD   [facility] 11/04/2020, 3:16 PM

## 2020-11-04 NOTE — Progress Notes (Signed)
Physical Therapy Treatment Patient Details Name: James Glover MRN: [id number] DOB: 12/18/1945 Today's Date: 11/04/2020    History of Present Illness The pt is a 75 yo male presenting 8/25 from SNF due to ultrasound and HIDA scan concerning for gangrenous cholecystitis and larger liver abscess. Pt reccently hospitalized 8/17-8/22 due to acute cholecystitis complicated by subcapsular live abscess and infection and d/c to SNF for short term rehab. PMH includes: aortic atherosclerosis, CKD III, HTN, DM II, and prostate cancer.    PT Comments    Pt. Reports feeling great and having no further PT needs.  Ready to D/C home tomorrow.  Declines practicing any functional mobility with PT at this time.  Pt. Will be left on caseload x 1 more day in the event he changes his mind and would like to practice tomorrow prior to D/C home.  Follow Up Recommendations  Home health PT     Equipment Recommendations  Rolling walker with 5" wheels    Recommendations for Other Services       Precautions / Restrictions      Mobility  Bed Mobility                    Transfers                    Ambulation/Gait                 Stairs             Wheelchair Mobility    Modified Rankin (Stroke Patients Only)       Balance                                            Cognition Arousal/Alertness: Awake/alert Behavior During Therapy: WFL for tasks assessed/performed Overall Cognitive Status: Within Functional Limits for tasks assessed                                 General Comments: Pt. declines to do functional mobility with PT, stating that he just ambulated and is fatigued at this time. NSG verfies that pt. has been amb with mod IND. Pt. also reports he got the all clear to D/C home tomorrow, and does not feel that there is anything else he needs to work on with PT. PT offers to practice stairs, bed mobility, etc. but pt. refuses. PT  makes sure pt's new RW is adjusted to his height and takes time to answer pt/spouse questiosn re: [place] therapy.  Pt. is encouraged to amb again later in day after having a chance to rest, and is agreeable.  Pt. is also encouraged to reach out to PT department tomorrow in the event he decides he does want to practice one last time prior to D/C home.      Exercises      General Comments        Pertinent Vitals/Pain Pain Assessment: 0-10 Pain Score: 0-No pain Pain Location: Pt. states he "feels great" today.    Home Living                      Prior Function            PT Goals (current goals can now be found in the care plan section)  Progress towards PT goals: Progressing toward goals    Frequency    Min 3X/week      PT Plan Current plan remains appropriate    Co-evaluation              AM-PAC PT "6 Clicks" Mobility   Outcome Measure  Help needed turning from your back to your side while in a flat bed without using bedrails?: A Little Help needed moving from lying on your back to sitting on the side of a flat bed without using bedrails?: A Little Help needed moving to and from a bed to a chair (including a wheelchair)?: A Little Help needed standing up from a chair using your arms (e.g., wheelchair or bedside chair)?: A Little Help needed to walk in hospital room?: A Little Help needed climbing 3-5 steps with a railing? : A Little 6 Click Score: 18    End of Session   Activity Tolerance: Patient limited by lethargy Patient left: in chair;with family/visitor present Nurse Communication: Mobility status       Time: [id number] PT Time Calculation (min) (ACUTE ONLY): 10 min  Charges:  $Therapeutic Activity: 8-22 mins                     James Glover A. Aideen [place], PT, DPT Acute Rehabilitation Services Office: [place] 11/04/2020, 3:39 PM

## 2020-11-04 NOTE — Progress Notes (Signed)
[facility] NEPHROLOGY PROGRESS NOTE  Assessment/ Plan:  #Acute kidney injury on CKD, nonoliguric likely due to ATN from sepsis and contrast induced nephropathy.  Baseline creatinine level around 1.9. UA bland.  CT scan of abdomen pelvis with contrast without hydronephrosis.  Urine output is increasing and creatinine level trending down.  He has lower extremity edema and some pleural effusion therefore I will start lower dose of Lasix 20 mg daily and then titrate as outpatient.  Clinically stable.  We will arrange outpatient follow-up after discharge.  Continue to hold ARB.  Pending labs from today, patient not wanting to draw labs daily.  #Acalculous cholecystitis complicated with liver abscess and severe sepsis: Status post hepatic drain, cholecystostomy tube and intrahepatic abscess drain.  On Augmentin.  Per primary team.  #Right pleural effusion status post thoracocentesis and 9/3.  #Edema: Received intermittent diuretics.  Resuming Lasix as discussed above.  Recommend salt and fluid restriction.  #Hypertension: On amlodipine.  ARB on hold.  #Hyponatremia due to AKI and diarrhea.  Sodium level improving.  #Hypokalemia: Replete potassium chloride.  Sign off, please call back with question.  Subjective: Seen and examined at bedside.  No new event.  He had a urine output around 1.6 L.  No lab results available from today.  He denies nausea, vomiting, chest pain, shortness of breath.  He feels good and asking when he can go home.  His wife at bedside.  Objective Vital signs in last 24 hours: Vitals:   11/03/20 1613 11/03/20 2050 11/04/20 0422 11/04/20 0815  BP: (!) 141/55 (!) 147/47 (!) 142/57 (!) 154/55  Pulse: 74 83 85 79  Resp: '18 18 18 18  '$ Temp: 98.1 F (36.7 C) 98.3 F (36.8 C) 98.3 F (36.8 C) 98.1 F (36.7 C)  TempSrc: Oral Oral Oral Oral  SpO2: 96% 95% 94% 92%  Weight:      Height:       Weight change:   Intake/Output Summary (Last 24 hours) at  11/04/2020 1115 Last data filed at 11/04/2020 0600 Gross per 24 hour  Intake 485 ml  Output 1390 ml  Net -905 ml        Labs: Basic Metabolic Panel: Recent Labs  Lab 10/29/20 0040 10/30/20 0327 11/01/20 0021 11/02/20 0715 11/03/20 0714  NA 130*   < > 125* 127* 132*  K 3.8   < > 3.8 3.8 3.1*  CL 97*   < > 90* 93* 91*  CO2 25   < > '25 23 28  '$ GLUCOSE 167*   < > 153* 169* 136*  BUN 19   < > 31* 33* 27*  CREATININE 1.63*   < > 3.31* 3.49* 2.79*  CALCIUM 7.9*   < > 7.8* 7.9* 8.5*  PHOS 3.2  --  4.1  --   --    < > = values in this interval not displayed.    Liver Function Tests: Recent Labs  Lab 10/30/20 0327 10/31/20 0044 11/01/20 0021 11/03/20 0714  AST 17 16  --  23  ALT 20 20  --  23  ALKPHOS 58 58  --  53  BILITOT 0.8 0.8  --  0.4  PROT 5.2* 5.3*  --  5.4*  ALBUMIN 1.6* 1.7* 1.7* 1.7*    No results for input(s): LIPASE, AMYLASE in the last 168 hours. No results for input(s): AMMONIA in the last 168 hours. CBC: Recent Labs  Lab 10/30/20 0327 10/31/20 0044 11/01/20 0021 11/02/20 0715 11/03/20 0714  WBC  13.0* 13.5* 14.1* 17.3* 11.0*  NEUTROABS  --  11.5* 12.1*  --  9.5*  HGB 9.8* 9.4* 8.9* 9.3* 8.9*  HCT 29.6* 28.0* 26.7* 27.1* 26.2*  MCV 95.2 94.0 93.7 93.1 92.9  PLT 312 282 274 255 267    Cardiac Enzymes: No results for input(s): CKTOTAL, CKMB, CKMBINDEX, TROPONINI in the last 168 hours. CBG: Recent Labs  Lab 11/03/20 0812 11/03/20 1201 11/03/20 1653 11/03/20 2107 11/04/20 0820  GLUCAP 122* 153* 181* 154* 142*     Iron Studies: No results for input(s): IRON, TIBC, TRANSFERRIN, FERRITIN in the last 72 hours. Studies/Results: DG Chest 2 View  Result Date: 11/03/2020 CLINICAL DATA:  Right pleural effusion. Status post thoracentesis on 11/01/2020 EXAM: CHEST - 2 VIEW COMPARISON:  11/01/2020 FINDINGS: Stable heart size. Residual predominantly lateral right pleural effusion appears similar to the post thoracentesis chest x-ray on 11/01/2020.  Residual pleural fluid may be partially loculated. Stable atelectasis of the right lower lung. No pneumothorax or pulmonary edema. IMPRESSION: Stable predominantly lateral right pleural effusion compared to the recent post thoracentesis chest x-ray. Stable atelectasis of the right lower lung. Electronically Signed   By: Aletta Edouard M.D.   On: 11/03/2020 08:06    Medications: Infusions:   Scheduled Medications:  amLODipine  10 mg Oral Daily   amoxicillin-clavulanate  1 tablet Oral Q12H   vitamin C  250 mg Oral BID   aspirin EC  81 mg Oral Daily   enoxaparin (LOVENOX) injection  65 mg Subcutaneous Daily   famotidine  20 mg Oral QHS   furosemide  20 mg Oral Daily   insulin aspart  0-15 Units Subcutaneous TID WC   insulin glargine-yfgn  12 Units Subcutaneous QHS   multivitamin with minerals  1 tablet Oral Daily   oxybutynin  5 mg Oral BID   pantoprazole  40 mg Oral Daily   potassium chloride  20 mEq Oral Daily   Ensure Max Protein  11 oz Oral BID   saccharomyces boulardii  250 mg Oral BID   simvastatin  20 mg Oral QHS   sodium chloride flush  5 mL Intracatheter Q8H   tamsulosin  0.4 mg Oral BID PC    have reviewed scheduled and prn medications.  Physical Exam: General:NAD, comfortable Heart:RRR, s1s2 nl Lungs: Clear b/l, no crackle Abdomen:soft, Non-tender, Drain present. Extremities: LE edema present+ Neurology: Alert, awake, nonfocal.  Shigeo Baugh Tanna Furry 11/04/2020,11:15 AM  LOS: 12 days

## 2020-11-04 NOTE — Progress Notes (Signed)
Subjective: CC: Doing well. No abdominal pain, n/v. Tolerating diet.   Objective: Vital signs in last 24 hours: Temp:  [98.1 F (36.7 C)-98.3 F (36.8 C)] 98.1 F (36.7 C) (09/06 0815) Pulse Rate:  [74-85] 79 (09/06 0815) Resp:  [18] 18 (09/06 0815) BP: (141-154)/(47-57) 154/55 (09/06 0815) SpO2:  [92 %-96 %] 92 % (09/06 0815) Last BM Date: 11/03/20  Intake/Output from previous day: 09/05 0701 - 09/06 0700 In: 725 [P.O.:720] Out: 1790 [Urine:1630; Drains:160] Intake/Output this shift: No intake/output data recorded.  PE: Gen:  Alert, NAD, pleasant Pulm: Normal rate and effort  Abd: Soft, ND, NT, +BS, superior IR drain SS, inferior IR drain bilious.  Psych: A&Ox3  Skin: no rashes noted, warm and dry  Lab Results:  Recent Labs    11/02/20 0715 11/03/20 0714  WBC 17.3* 11.0*  HGB 9.3* 8.9*  HCT 27.1* 26.2*  PLT 255 267   BMET Recent Labs    11/02/20 0715 11/03/20 0714  NA 127* 132*  K 3.8 3.1*  CL 93* 91*  CO2 23 28  GLUCOSE 169* 136*  BUN 33* 27*  CREATININE 3.49* 2.79*  CALCIUM 7.9* 8.5*   PT/INR No results for input(s): LABPROT, INR in the last 72 hours. CMP     Component Value Date/Time   NA 132 (L) 11/03/2020 0714   NA 140 08/19/2020 0940   K 3.1 (L) 11/03/2020 0714   CL 91 (L) 11/03/2020 0714   CO2 28 11/03/2020 0714   GLUCOSE 136 (H) 11/03/2020 0714   BUN 27 (H) 11/03/2020 0714   BUN 27 08/19/2020 0940   CREATININE 2.79 (H) 11/03/2020 0714   CREATININE 1.73 (H) 02/02/2017 1205   CALCIUM 8.5 (L) 11/03/2020 0714   PROT 5.4 (L) 11/03/2020 0714   PROT 6.1 08/19/2020 0940   ALBUMIN 1.7 (L) 11/03/2020 0714   ALBUMIN 4.0 08/19/2020 0940   AST 23 11/03/2020 0714   ALT 23 11/03/2020 0714   ALKPHOS 53 11/03/2020 0714   BILITOT 0.4 11/03/2020 0714   BILITOT 0.3 08/19/2020 0940   GFRNONAA 23 (L) 11/03/2020 0714   GFRAA 47 (L) 08/13/2019 0835   Lipase  No results found for: LIPASE  Studies/Results: DG Chest 2 View  Result Date:  11/03/2020 CLINICAL DATA:  Right pleural effusion. Status post thoracentesis on 11/01/2020 EXAM: CHEST - 2 VIEW COMPARISON:  11/01/2020 FINDINGS: Stable heart size. Residual predominantly lateral right pleural effusion appears similar to the post thoracentesis chest x-ray on 11/01/2020. Residual pleural fluid may be partially loculated. Stable atelectasis of the right lower lung. No pneumothorax or pulmonary edema. IMPRESSION: Stable predominantly lateral right pleural effusion compared to the recent post thoracentesis chest x-ray. Stable atelectasis of the right lower lung. Electronically Signed   By: Aletta Edouard M.D.   On: 11/03/2020 08:06    Anti-infectives: Anti-infectives (From admission, onward)    Start     Dose/Rate Route Frequency Ordered Stop   11/01/20 2200  amoxicillin-clavulanate (AUGMENTIN) 500-125 MG per tablet 500 mg        1 tablet Oral Every 12 hours 11/01/20 1441     10/30/20 2200  amoxicillin-clavulanate (AUGMENTIN) 875-125 MG per tablet 1 tablet  Status:  Discontinued        1 tablet Oral Every 12 hours 10/30/20 1526 11/01/20 1441   10/23/20 2200  piperacillin-tazobactam (ZOSYN) IVPB 3.375 g  Status:  Discontinued        3.375 g 12.5 mL/hr over 240 Minutes Intravenous Every 8  hours 10/23/20 1324 10/30/20 1526   10/23/20 1245  piperacillin-tazobactam (ZOSYN) IVPB 3.375 g        3.375 g 100 mL/hr over 30 Minutes Intravenous  Once 10/23/20 1234 10/23/20 1523        Assessment/Plan Acute cholecystitis - gangrenous Pericholecystic/ perihepatic fluid collections - Patient was admitted to [facility] for cholecystitis w/ liver abscesses and bacteremia on 8/17 - 8/22. Had a HIDA scan done 8/25 that showed non filling of the GB c/w acute cholecystitis. CT showed Pericholecystic/ perihepatic fluid collections. Dr. Blake Divine recommended IR cholecystostomy tube / IR drainage of the abscess. Patient had already been to IR for these procedures prior to our teams evaluation.  - S/p  percutaneous cholecystostomy and percutaneous drainage of two perihepatic fluid collections 8/26 - Cx NGTD - Repeat CT 8/31 w/ diminished size of intrahepatic pericholecystic abscess with decompression of the gallbladder and near complete resolution of perihepatic fluid. - Infrahepatic drain removed by IR on 9/1 - Transitioned to PO abx on 9/3. WBC improving - Clinically improving. He denies pain and is tolerating a diet. No ttp on exam.  - Drains per IR - Will follow along peripherally, in case patient's status worsens and he needs urgent surgery. For now, he seems to be improved with drains in place. Appears he is more in the hospital now for his pleural effusion and AKI. Will plan to see again Friday if he remains in the hospital. Follow-up with Dr. Constance Haw for eventual interval cholecystectomy.   FEN - Renal VTE - SCDs, Lovenox ID - Augmentin  R pleural effusion - s/p Thoracentesis 9/3 HTN AKI DM   LOS: 12 days    Jillyn Ledger , [facility] 11/04/2020, 9:55 AM Please see Amion for pager number during day hours 7:00am-4:30pm

## 2020-11-04 NOTE — Plan of Care (Signed)
  Problem: Education: Goal: Knowledge of General Education information will improve Description Including pain rating scale, medication(s)/side effects and non-pharmacologic comfort measures Outcome: Progressing   

## 2020-11-05 LAB — GLUCOSE, CAPILLARY
Glucose-Capillary: 58 mg/dL — ABNORMAL LOW (ref 70–99)
Glucose-Capillary: 235 mg/dL — ABNORMAL HIGH (ref 70–99)

## 2020-11-05 LAB — PATHOLOGIST SMEAR REVIEW

## 2020-11-05 MED ORDER — PROSOURCE PLUS PO LIQD
30.0000 mL | Freq: Three times a day (TID) | ORAL | Status: DC
  Administered 2020-11-05: 30 mL via ORAL
  Filled 2020-11-05: qty 30

## 2020-11-05 MED ORDER — PROSOURCE PLUS PO LIQD
30.0000 mL | Freq: Three times a day (TID) | ORAL | 0 refills | Status: DC
Start: 2020-11-05 — End: 2020-11-12

## 2020-11-05 NOTE — Progress Notes (Signed)
Nutrition Follow-up  DOCUMENTATION CODES:   Obesity unspecified  INTERVENTION:   -D/c Ensure Max -MVI with minerals daily -Double protein portions with meals -30 ml Prosource Plus TID, each supplement provides 100 kcals and 15 grams protein  NUTRITION DIAGNOSIS:   Inadequate oral intake related to acute illness as evidenced by per patient/family report.  Ongoing  GOAL:   Patient will meet greater than or equal to 90% of their needs  Progressing   MONITOR:   PO intake, Supplement acceptance, Labs, Weight trends, Skin, I & O's  REASON FOR ASSESSMENT:   Consult Assessment of nutrition requirement/status  ASSESSMENT:   75 y.o. male with medical history significant for recently diagnosed acalculous cholecystitis with subcapsular liver abscess and strep gallolyticus bacteremia, HTN, IDDM, HLD, invasive prostate cancer status post brachytherapy in 2020 now in remission, CKD stage III, GERD and BPH who is admittted with worsening of cholecystitis and liver abscess now s/p IR drain placement 8/26  8/25- s/p HIDA scan 8/26- s/p IR drain 9/2- s/p removal of one perihepatic drain 9/3- s/p s/p rt thoracentesis (removed 950 ml)  Reviewed I/O's: -2 L x 24 hours and -8.2 L since admission  UOP: 2.2 L x 24 hours  Drain output: 225 ml x 24 hours  Pt unavailable at time of visit.   Pt with good appetite. Noted meal completion 75-100%. Pt has been refusing Ensure Max supplements secondary to poor tolerance. RN requesting RD re-evaluate supplement regimen.   Per MD notes, plan for possible d/c home today. Pt wife has been educated on drain care.   Medications reviewed and include ditropan and KCl  Labs reviewed: Na: 132, CBGS: 58-211 (inpatient orders for glycemic control are 0-15 units insulin aspart TID with meals and 12 units insulin glargine-yfgn daily).    Diet Order:   Diet Order             Diet - low sodium heart healthy           Diet regular Room service  appropriate? Yes; Fluid consistency: Thin  Diet effective now                   EDUCATION NEEDS:   Education needs have been addressed  Skin:  Skin Assessment: Skin Integrity Issues: Skin Integrity Issues:: Stage II Stage II: lt buttocks  Last BM:  11/04/20  Height:   Ht Readings from Last 1 Encounters:  10/30/20 '6\' 3"'$  (1.905 m)    Weight:   Wt Readings from Last 1 Encounters:  10/30/20 134.2 kg    Ideal Body Weight:  89 kg  BMI:  Body mass index is 36.98 kg/m.  Estimated Nutritional Needs:   Kcal:  [id number]  Protein:  130-145 grams  Fluid:  > 2 L    Loistine Chance, RD, LDN, [place] Registered Dietitian II Certified Diabetes Care and Education Specialist Please refer to [facility] for RD and/or RD on-call/weekend/after hours pager

## 2020-11-05 NOTE — Progress Notes (Signed)
Inpatient Diabetes Program Recommendations  AACE/ADA: New Consensus Statement on Inpatient Glycemic Control (2015)  Target Ranges:  Prepandial:   less than 140 mg/dL      Peak postprandial:   less than 180 mg/dL (1-2 hours)      Critically ill patients:  140 - 180 mg/dL   Lab Results  Component Value Date   GLUCAP 58 (L) 11/05/2020   HGBA1C 7.6 (H) 10/23/2020    Review of Glycemic Control Results for James Glover, James Glover (MRN [id number]) as of 11/05/2020 10:40  Ref. Range 11/04/2020 08:20 11/04/2020 12:21 11/04/2020 16:01 11/04/2020 20:24 11/05/2020 07:55  Glucose-Capillary Latest Ref Range: 70 - 99 mg/dL 142 (H) 211 (H) 210 (H) 133 (H) 58 (L)    Diabetes history: DM 2 Outpatient Diabetes medications:  Tresiba 18 units q HS Actos 45 mg daily Onglyza 2.5 mg daily Current orders for Inpatient glycemic control:  Novolog moderate tid with meals  Semglee 12 units q HS  Inpatient Diabetes Program Recommendations:    -  Consider decreasing Semglee to 10 units q HS -  Add Novolog 4 units tid meal coverage if eating >50% of meals  Thanks,  Tama Headings RN, MSN, BC-ADM Inpatient Diabetes Coordinator Team Pager [phone] (8a-5p)

## 2020-11-06 ENCOUNTER — Other Ambulatory Visit: Payer: Self-pay

## 2020-11-06 ENCOUNTER — Telehealth: Payer: Self-pay

## 2020-11-06 ENCOUNTER — Telehealth: Payer: Self-pay | Admitting: *Deleted

## 2020-11-06 DIAGNOSIS — K750 Abscess of liver: Secondary | ICD-10-CM

## 2020-11-06 DIAGNOSIS — I10 Essential (primary) hypertension: Secondary | ICD-10-CM

## 2020-11-06 DIAGNOSIS — E669 Obesity, unspecified: Secondary | ICD-10-CM

## 2020-11-06 DIAGNOSIS — A419 Sepsis, unspecified organism: Secondary | ICD-10-CM

## 2020-11-06 LAB — CULTURE, BODY FLUID W GRAM STAIN -BOTTLE: Culture: NO GROWTH

## 2020-11-06 NOTE — Telephone Encounter (Signed)
Patient's wife Leatrice Jewels called and had a question about his nutrition. He has not had a bowel movement in 5 days. She wanted to know if can resume a regular diet inclusive of high fiber items, or should he maybe take a fiber supplement? Other suggestions? Please advise.

## 2020-11-06 NOTE — Telephone Encounter (Signed)
I reviewed discharge summary, no mention of specific dietary measures (or mention of constipation). I think a high fiber diet is fine, along with staying well hydrated. If bloated, distended, uncomfortable, could try laxative, but only if unable to have a bowel movement with gentler options tried first.  I recommend powder supplement (metamucil, or citricel), rather than wafers or fiber pills, as this ensures he gets the full glass of water along with the fiber.

## 2020-11-06 NOTE — Patient Outreach (Signed)
[facility]) Care Management  11/06/2020  MARKANDREW SCHNICK [birth year] [id number]  [facility] [ACO] Patient: [name] P[employer], [employer],   Follow up:  Call placed for follow up as patient had extreme high risk scores for unplanned readmission risk. Patient went home with home health orders.  Called patient's phone and able to leave a HIPPA complaint voicemail requesting a return call.  1127 am:  Call returned from [facility], was able to get permission from patient Kadarious Risper to speak with wife regarding [facility].  Wife states she had just spoken with Dr. Tera Helper Knapp's office for transition of care hospital follow up.  She states she is anticipating [facility] to call for Home Health arrangements.  She was able to find their contact information when asked if she had his discharge paper work.  Plan: Assign to [facility] Telephone R.B Care Coordinator for complex disease management.  For questions, please contact:  Natividad Brood, RN BSN [facility] Liaison  [phone] business mobile phone Toll free office [phone]  Fax number: [phone] [email or web address]

## 2020-11-06 NOTE — Telephone Encounter (Signed)
Transition Care Management Follow-up Telephone Call Date of discharge and from where: [facility] 11[facility]20 How have you been since you were released from the hospital? No Any questions or concerns? No  Items Reviewed: Did the pt receive and understand the discharge instructions provided? Yes  Medications obtained and verified? Yes  Other? No  Any new allergies since your discharge? No  Dietary orders reviewed? No Do you have support at home? Yes   Home Care and Equipment/Supplies: Were home health services ordered? yes If so, what is the name of the agency? [facility]  Has the agency set up a time to come to the patient's home? no Were any new equipment or medical supplies ordered?  No  Functional Questionnaire: (I = Independent and D = Dependent) ADLs: I  Bathing/Dressing- I  Meal Prep- I  Eating- I  Maintaining continence- I  Transferring/Ambulation- I  Managing Meds- I  Follow up appointments reviewed:  PCP Hospital f/u appt confirmed? Yes  Scheduled to see [facility] on 11/12/20 @ 10:30a.m. [facility] f/u appt confirmed? No   Are transportation arrangements needed? No  If their condition worsens, is the pt aware to call PCP or go to the Emergency Dept.? Yes Was the patient provided with contact information for the PCP's office or ED? Yes Was to pt encouraged to call back with questions or concerns? Yes

## 2020-11-06 NOTE — Telephone Encounter (Signed)
Patient advised.

## 2020-11-07 ENCOUNTER — Other Ambulatory Visit: Payer: Self-pay

## 2020-11-07 DIAGNOSIS — K750 Abscess of liver: Secondary | ICD-10-CM | POA: Diagnosis not present

## 2020-11-07 DIAGNOSIS — A409 Streptococcal sepsis, unspecified: Secondary | ICD-10-CM | POA: Diagnosis not present

## 2020-11-07 DIAGNOSIS — I1310 Hypertensive heart and chronic kidney disease without heart failure, with stage 1 through stage 4 chronic kidney disease, or unspecified chronic kidney disease: Secondary | ICD-10-CM | POA: Diagnosis not present

## 2020-11-07 DIAGNOSIS — K810 Acute cholecystitis: Secondary | ICD-10-CM | POA: Diagnosis not present

## 2020-11-07 DIAGNOSIS — K8689 Other specified diseases of pancreas: Secondary | ICD-10-CM | POA: Diagnosis not present

## 2020-11-07 DIAGNOSIS — N183 Chronic kidney disease, stage 3 unspecified: Secondary | ICD-10-CM | POA: Diagnosis not present

## 2020-11-07 DIAGNOSIS — E1122 Type 2 diabetes mellitus with diabetic chronic kidney disease: Secondary | ICD-10-CM | POA: Diagnosis not present

## 2020-11-07 DIAGNOSIS — I2510 Atherosclerotic heart disease of native coronary artery without angina pectoris: Secondary | ICD-10-CM | POA: Diagnosis not present

## 2020-11-07 DIAGNOSIS — M47814 Spondylosis without myelopathy or radiculopathy, thoracic region: Secondary | ICD-10-CM | POA: Diagnosis not present

## 2020-11-07 NOTE — Patient Outreach (Signed)
[facility]) Care Management  11/07/2020  [facility] Glover 25-May-1945 [id number]     Transition of Care Referral  Referral Date: 11/06/2020 Referral Source: [facility] Liaison Date of Discharge: 11/04/2020 Facility: [facility] PCP Office Does [facility]   Outreach attempt # 1 to patient. RN CM spoke with both patient and spouse. Patient reports he is doing fairly well and pleased and how he is recovering. He resides in the home with supportive spouse who assists as needed. He is using walker to adie in ambulation. Spouse will be taking him to appts. He is getting [facility] RN and PT. He reports that RN was out tos see him on yesterday and coming back this weekend.   Conditions: Per chart review, patient has PMH that includes but not limited to HTN, HLD, DM, prostate CA(2020), gangrenous cholecystitis. Patient has had two recent hospitalizations relate to gallbladder issues. He was discharged home with abscess drain and is awaiting gallbladder surgery removal.  Medications: Med review completed with spouse. She has already called urology office as she has concerns/questions regarding patient taking Lasix and Flomax at the same time. Otherwise, no issues noted.  Medications Reviewed Today     Reviewed by Hayden Pedro, RN (Registered Nurse) on 11/07/20 at 1000  Med List Status: <None>   Medication Order Taking? Sig Documenting Provider Last Dose Status Informant  ACCU-CHEK GUIDE test strip [id number] No USE AS DIRECTED [name], MD Taking Active Spouse/Significant Other  amLODipine (NORVASC) 10 MG tablet [id number]  Take 1 tablet (10 mg total) by mouth daily. Nita Sells, MD  Active   amoxicillin-clavulanate (AUGMENTIN) 500-125 MG tablet [id number]  Take 1 tablet (500 mg total) by mouth every 12 (twelve) hours for 8 days. Nita Sells, MD  Active   aspirin 81 MG tablet [id number] No Take 81 mg by mouth daily. [provider] 10/14/2020  Active Spouse/Significant Other  famotidine (PEPCID) 40 MG tablet [id number] No Take 40 mg by mouth at bedtime. [provider] 10/14/2020 Active Spouse/Significant Other  fluorouracil (EFUDEX) 5 % cream [id number] No  [provider] unknown Active Spouse/Significant Other  furosemide (LASIX) 20 MG tablet [id number]  Take 1 tablet (20 mg total) by mouth daily. Nita Sells, MD  Active   insulin degludec (TRESIBA FLEXTOUCH) 100 UNIT/ML FlexTouch Pen [id number] No ADMINISTER 18 UNITS UNDER THE SKIN AT BEDTIME  Patient taking differently: Inject 18 Units into the skin at bedtime.   [name], MD 10/14/2020 Active Spouse/Significant Other  Multiple Vitamins-Minerals (MULTIVITAMIN WITH MINERALS) tablet [id number] No Take 1 tablet by mouth daily. [provider] 10/14/2020 Active Spouse/Significant Other  Nutritional Supplements (,FEEDING SUPPLEMENT, PROSOURCE PLUS) liquid [id number]  Take 30 mLs by mouth 3 (three) times daily between meals for 28 days. Lavina Hamman, MD  Active   Omega-3 Fatty Acids (FISH OIL) 1000 MG CAPS [id number] No Take 1,000 mg by mouth 2 (two) times daily. [provider] 10/14/2020 Active Spouse/Significant Other  omeprazole (PRILOSEC) 40 MG capsule [id number] No TAKE 1 CAPSULE(40 MG) BY MOUTH IN THE MORNING AND AT BEDTIME  Patient taking differently: Take 40 mg by mouth in the morning and at bedtime.   [name], MD 10/14/2020 Active Spouse/Significant Other  ondansetron (ZOFRAN) 4 MG tablet [id number]  Take 1 tablet (4 mg total) by mouth every 6 (six) hours as needed for nausea. Nita Sells, MD  Active   oxybutynin (DITROPAN) 5 MG tablet [id number] No Take 5 mg by mouth 2 (two) times daily.  [provider] 10/14/2020 Active Spouse/Significant Other  pioglitazone (ACTOS) 45 MG tablet [id number] No Take 1 tablet (45 mg total) by mouth daily. [name], MD 10/14/2020 Active Spouse/Significant Other  saccharomyces boulardii  (FLORASTOR) 250 MG capsule [id number]  Take 1 capsule (250 mg total) by mouth 2 (two) times daily for 25 days. Patrecia Pour, MD  Active   saxagliptin HCl (ONGLYZA) 2.5 MG TABS tablet [id number] No TAKE 1 TABLET(2.5 MG) BY MOUTH DAILY  Patient taking differently: Take 2.5 mg by mouth daily.   [name], MD 10/14/2020 Active Spouse/Significant Other  simvastatin (ZOCOR) 20 MG tablet [id number] No TAKE 1 TABLET(20 MG) BY MOUTH AT BEDTIME  Patient taking differently: Take 20 mg by mouth at bedtime.   [name], MD 10/14/2020 Active Spouse/Significant Other  tamsulosin (FLOMAX) 0.4 MG CAPS capsule [id number] No Take 1 capsule (0.4 mg total) by mouth 2 (two) times daily after a meal. For urinary urgency or weak stream. Bruning, Ashlyn, PA-C 10/14/2020 Active Spouse/Significant Other           Med Note Jimmye Norman, DAWN S   Wed Oct 15, 2020  9:03 PM)    traMADol (ULTRAM) 50 MG tablet [id number]  Take 50 mg by mouth in the morning and at bedtime. [provider]  Active              Fall Risk  11/07/2020 02/21/2020 02/07/2019 02/02/2018 06/23/2016  Falls in the past year? 0 0 0 0 No  Number falls in past yr: 0 - - - -  Injury with Fall? 0 - - - -  Risk for fall due to : Medication side effect - - - -  Follow up Education provided;Falls evaluation completed - - - -    Depression screen [facility] 2/9 11/07/2020 02/21/2020 02/07/2019 08/16/2018 02/02/2018  Decreased Interest 0 0 0 0 0  Down, Depressed, Hopeless 0 0 0 - 0  PHQ - 2 Score 0 0 0 0 0    SDOH Screenings   Alcohol Screen: Not on file  Depression (PHQ2-9): Low Risk    PHQ-2 Score: 0  Financial Resource Strain: Not on file  Food Insecurity: No Food Insecurity   Worried About Charity fundraiser in the Last Year: Never true   Ran Out of Food in the Last Year: Never true  Housing: Not on file  Physical Activity: Not on file  Social Connections: Not on file  Stress: Not on file  Tobacco Use: Low Risk    Smoking Tobacco Use: Never   Smokeless  Tobacco Use: Never  Transportation Needs: No Transportation Needs   Lack of Transportation (Medical): No   Lack of Transportation (Non-Medical): No     Goals Addressed               This Visit's Progress     (THN)Make and Keep All Appointments (pt-stated)        Timeframe:  Short-Term Goal Priority:  High Start Date:  11/07/2020                           Expected End Date:  Oct 2022                     Follow Up Date Oct 2022   Barriers: None    - call to cancel if needed - keep a calendar with appointment dates    Why is this important?   Part of staying  healthy is seeing the doctor for follow-up care.  If you forget your appointments, there are some things you can do to stay on track.    Notes:   11/07/20-Spouse able to review list of upcoming MD appts with several MDs. She will be taking patient to appt. Patient also awaiting further appt date for gallbladder surgery.       (THN)Protect My Health (pt-stated)        Timeframe:  Short-Term Goal Priority:  High Start Date:   11/07/2020                          Expected End Date:  Oct 2022                     Follow Up Date Oct 2022  Barriers: Knowledge    -pt/spouse will monitor abscess and chest tube drain -pt/spouse will be seek medical attention as needed -pt will have no s/s of infection sites   Why is this important?   Screening tests can find diseases early when they are easier to treat.  Your doctor or nurse will talk with you about which tests are important for you.  Getting shots for common diseases like the flu and shingles will help prevent them.     Notes:   11/07/20-Patient discharged home with both abscess and chest tube drain. Spouse has gotten education from hospital and [facility] regarding mgmt and has been performing daily care as needed.       (THN)Set My Target A1C-Diabetes Type 2 (pt-stated)        Timeframe:  Long-Range Goal Priority:  Medium Start Date:   11/07/2020                          Expected End Date: March 2023                      Follow Up Date Oct 2022  Barriers: Health Behaviors Knowledge    - set target A1C    Why is this important?   Your target A1C is decided together by you and your doctor.  It is based on several things like your age and other health issues.    Notes:  11/07/20-Spouse reports that they have not checked cbgs since discharge home. Patient normally monitors cbgs once per day(QAM). She will start checking cbgs. Last A1C on file 7.6(Aug 2022)         Consent: THN services reviewed and discussed with patient and spouse. Verbal consent for services given.  Plan: RN CM discussed with patient/spouse next outreach within 3-4wks. Patient/spouse agrees to care plan and follow up. Patient/spouse gave verbal consent and in agreement with RN CM follow up and timeframe. Pt/spouse aware that they may contact RN CM sooner for any issues or concerns. RN CM reviewed goals and plan of care with patient and spouse. RN CM will send barriers letter and route encounter to PCP. RN CM will send welcome letter to patient.  Enzo [place], RN,BSN,CCM [facility] Telephonic Care Management Coordinator Direct Phone: [phone] Toll Free: [phone] Fax: [phone]

## 2020-11-10 ENCOUNTER — Telehealth: Payer: Self-pay | Admitting: *Deleted

## 2020-11-10 DIAGNOSIS — I1310 Hypertensive heart and chronic kidney disease without heart failure, with stage 1 through stage 4 chronic kidney disease, or unspecified chronic kidney disease: Secondary | ICD-10-CM | POA: Diagnosis not present

## 2020-11-10 DIAGNOSIS — K8689 Other specified diseases of pancreas: Secondary | ICD-10-CM | POA: Diagnosis not present

## 2020-11-10 DIAGNOSIS — K750 Abscess of liver: Secondary | ICD-10-CM | POA: Diagnosis not present

## 2020-11-10 DIAGNOSIS — N183 Chronic kidney disease, stage 3 unspecified: Secondary | ICD-10-CM | POA: Diagnosis not present

## 2020-11-10 DIAGNOSIS — E1122 Type 2 diabetes mellitus with diabetic chronic kidney disease: Secondary | ICD-10-CM | POA: Diagnosis not present

## 2020-11-10 DIAGNOSIS — M47814 Spondylosis without myelopathy or radiculopathy, thoracic region: Secondary | ICD-10-CM | POA: Diagnosis not present

## 2020-11-10 DIAGNOSIS — K810 Acute cholecystitis: Secondary | ICD-10-CM | POA: Diagnosis not present

## 2020-11-10 DIAGNOSIS — I2510 Atherosclerotic heart disease of native coronary artery without angina pectoris: Secondary | ICD-10-CM | POA: Diagnosis not present

## 2020-11-10 DIAGNOSIS — A409 Streptococcal sepsis, unspecified: Secondary | ICD-10-CM | POA: Diagnosis not present

## 2020-11-10 NOTE — Telephone Encounter (Signed)
Done

## 2020-11-10 NOTE — Telephone Encounter (Signed)
Barb Merino, PT w/[facility] went to patient's home for PT eval today. Called asking for verbal orders including order for today's eval. 2x a week for 3 weeks, 1 x a week for 4 weeks-working on balance and gait training trying to improve endurance and safety.  [phone]

## 2020-11-10 NOTE — Telephone Encounter (Signed)
Ok for PT 

## 2020-11-11 ENCOUNTER — Other Ambulatory Visit: Payer: Self-pay | Admitting: General Surgery

## 2020-11-11 ENCOUNTER — Other Ambulatory Visit: Payer: Self-pay

## 2020-11-11 ENCOUNTER — Ambulatory Visit: Payer: Medicare PPO | Admitting: General Surgery

## 2020-11-11 ENCOUNTER — Encounter: Payer: Self-pay | Admitting: General Surgery

## 2020-11-11 VITALS — BP 141/71 | HR 90 | Temp 97.8°F | Resp 14 | Ht 76.0 in | Wt 264.0 lb

## 2020-11-11 DIAGNOSIS — K750 Abscess of liver: Secondary | ICD-10-CM | POA: Diagnosis not present

## 2020-11-11 DIAGNOSIS — K810 Acute cholecystitis: Secondary | ICD-10-CM | POA: Diagnosis not present

## 2020-11-11 NOTE — Progress Notes (Signed)
Chief Complaint  Patient presents with   Hospitalization Follow-up    Hospital follow up.     Patient presents for hospital follow-up.  He has had 2 hospitalizations since last visit here. He was hospitalized 8/17-22/22 with acute cholecystitis and sepsis.  Culture grew Streptococcus gallolyticus. He was discharged to SNF, but was readmitted on 8/25 after f/u US showed enlarging liver abscess.  He was hospitalized 8/25- 11/04/20.  He had cholecystotomy tube placed and hepatic drains.  Plan is for outpatient cholecystectomy at some point. He saw the surgeon yesterday in f/u, and the JP drain in the superior perihepatic collection was removed; intrahepatic drain had been removed in the hospital. The cholecystotomy tube remains, as it is draining about 50-75cc of bile/d. He reports this drainage is decreasing. He is to f/u with IR in a few weeks on this tube. He was discharged on Augmentin, and is on the last day of antibiotic today.  He had CBC drawn by surgeon yesterday--WBC is increasing Lab Results  Component Value Date   WBC 13.2 (H) 11/11/2020   HGB 9.7 (L) 11/11/2020   HCT 29.9 (L) 11/11/2020   MCV 94.3 11/11/2020   PLT 356 11/11/2020   He had AKI on top of his CKD stage 3, likely related to contrast-induced nephropathy.  ARB-HCTZ was stopped.  BP was very high.  Amlodipine was increased to '10mg'$ . Lasix was restarted by nephrology at '20mg'$  daily, and he will f/u with Dr. Marval Regal. Cr improved on last recheck (yesterday)   Chemistry      Component Value Date/Time   NA 131 (L) 11/11/2020 1022   NA 140 08/19/2020 0940   K 4.5 11/11/2020 1022   CL 95 (L) 11/11/2020 1022   CO2 26 11/11/2020 1022   BUN 16 11/11/2020 1022   BUN 27 08/19/2020 0940   CREATININE 1.41 (H) 11/11/2020 1022      Component Value Date/Time   CALCIUM 8.5 (L) 11/11/2020 1022   ALKPHOS 53 11/03/2020 0714   AST 11 11/11/2020 1022   ALT 13 11/11/2020 1022   BILITOT 0.4 11/11/2020 1022   BILITOT 0.3 08/19/2020 0940       He had pleural effusion, s/p thoracentesis.  Possibly secondary to adjacent abscesses. Culture was negative. Last CXR was 9/5: IMPRESSION: Stable predominantly lateral right pleural effusion compared to the recent post thoracentesis chest x-ray. Stable atelectasis of the right lower lung.   All subsequent blood cultures were negative (+ only on 8/17 admission) Stage II left buttock pressure ulcer from prior to admission.    He is complaining of "bad jock itch", which started in the hospital.  Diabetes:  He continues to take onglyza, pioglitazone, and [place].  Sugars are running: 128 yesterday morning, 143 this morning.  Prior to hospitalizations, fasting sugars ranged from 79-134.  Lab Results  Component Value Date   HGBA1C 7.6 (H) 10/23/2020  A1c was 7.0 at last visit with me in 07/2020.  Hypertension:  Losartan and HCTZ were stopped during hospitalization due to AKI.  He is now on amlodipine '10mg'$  daily.  (At one point he was also on hydralazine during hospital stay, but stopped prior to discharge). BP's  haven't been checked since discharged.   PMH, PSH, SH reviewed  Outpatient Encounter Medications as of 11/12/2020  Medication Sig   ACCU-CHEK GUIDE test strip USE AS DIRECTED   amLODipine (NORVASC) 10 MG tablet Take 1 tablet (10 mg total) by mouth daily.   [EXPIRED] amoxicillin-clavulanate (AUGMENTIN) 500-125 MG tablet Take 1  tablet (500 mg total) by mouth every 12 (twelve) hours for 8 days.   aspirin 81 MG tablet Take 81 mg by mouth daily.   fluconazole (DIFLUCAN) 150 MG tablet Take 1 tablet (150 mg total) by mouth once a week. Take 1 tablet by mouth once a week for 2-4 weeks to treat jock itch fungal infection   furosemide (LASIX) 20 MG tablet Take 1 tablet (20 mg total) by mouth daily.   insulin degludec (TRESIBA FLEXTOUCH) 100 UNIT/ML FlexTouch Pen ADMINISTER 18 UNITS UNDER THE SKIN AT BEDTIME (Patient taking differently: Inject 18 Units into the skin at bedtime.)    Multiple Vitamins-Minerals (MULTIVITAMIN WITH MINERALS) tablet Take 1 tablet by mouth daily.   Omega-3 Fatty Acids (FISH OIL) 1000 MG CAPS Take 1,000 mg by mouth 2 (two) times daily.   omeprazole (PRILOSEC) 40 MG capsule TAKE 1 CAPSULE(40 MG) BY MOUTH IN THE MORNING AND AT BEDTIME (Patient taking differently: Take 40 mg by mouth in the morning and at bedtime.)   oxybutynin (DITROPAN) 5 MG tablet Take 5 mg by mouth 2 (two) times daily.    pioglitazone (ACTOS) 45 MG tablet Take 1 tablet (45 mg total) by mouth daily.   saxagliptin HCl (ONGLYZA) 2.5 MG TABS tablet TAKE 1 TABLET(2.5 MG) BY MOUTH DAILY (Patient taking differently: Take 2.5 mg by mouth daily.)   simvastatin (ZOCOR) 20 MG tablet TAKE 1 TABLET(20 MG) BY MOUTH AT BEDTIME (Patient taking differently: Take 20 mg by mouth at bedtime.)   tamsulosin (FLOMAX) 0.4 MG CAPS capsule Take 1 capsule (0.4 mg total) by mouth 2 (two) times daily after a meal. For urinary urgency or weak stream.   [DISCONTINUED] Nutritional Supplements (,FEEDING SUPPLEMENT, PROSOURCE PLUS) liquid Take 30 mLs by mouth 3 (three) times daily between meals for 28 days.   famotidine (PEPCID) 40 MG tablet Take 40 mg by mouth at bedtime. (Patient not taking: Reported on 11/12/2020)   fluorouracil (EFUDEX) 5 % cream  (Patient not taking: Reported on 11/12/2020)   [DISCONTINUED] ondansetron (ZOFRAN) 4 MG tablet Take 1 tablet (4 mg total) by mouth every 6 (six) hours as needed for nausea.   [DISCONTINUED] saccharomyces boulardii (FLORASTOR) 250 MG capsule Take 1 capsule (250 mg total) by mouth 2 (two) times daily for 25 days.   [DISCONTINUED] traMADol (ULTRAM) 50 MG tablet Take 50 mg by mouth in the morning and at bedtime.   No facility-administered encounter medications on file as of 11/12/2020.   NOT taking fluconazole prior to today's visit  Allergies  Allergen Reactions   Ace Inhibitors Cough   Statins Cough     ROS:  No fever.  He stays cold, not really new for him.  No URI  symptoms. No CP, palpitations, SOB.  Some DOE, has been improving with PT.  No N/V, heartburn.  No diarrhea since hospitalization.  Some constipation--r/b prune juice. No bleeding, bruising.  Rash at genital area (jock itch) and redness on buttocks. He thinks the sore is healing.    PHYSICAL EXAM:  BP 134/72   Pulse 88   Ht '6\' 4"'$  (1.93 m)   Wt 262 lb 12.8 oz (119.2 kg)   BMI 31.99 kg/m   Wt Readings from Last 3 Encounters:  11/12/20 262 lb 12.8 oz (119.2 kg)  11/11/20 264 lb (119.7 kg)  10/30/20 295 lb 13.7 oz (134.2 kg)   Pleasant elderly male, in good spirits, accompanied by his wife Leatrice Jewels. HEENT: conjunctiva and sclera are clear, EOMI, wearing mask, but often below nose/mouth.  While his skin is tanned, there is an underlying pallor, noted at lips Neck: no lymphadenopathy, thyromegaly or mass Heart: regular rate and rhythm Lungs: Decreased BS at R base. No wheezes, rales, ronchi. Chest: nontender.   Abdomen: soft, nontender. Cholecystotomy tube in place, draining bile. No erythema or tenderness around site.  Healed drain site. Skin: Beefy red at groin bilaterally. No satellite pustules. Erythematous also between buttocks.  On the L buttock cheek there is rash that seems more circular with central clearing. Pressure ulcer L buttock--no longer ulcerated, slight scab present. Neuro: alert and oriented, normal gait, strength Psych: normal mood, affect, hygiene and grooming   ASSESSMENT/PLAN:  Acute cholecystitis - s/p 2 recent hospitalizations, with sepsis and liver abscesses.  Will need cholecystectomy when stable; f/b surgeon - Plan: CBC with Differential/Platelet  CKD stage 3 due to type 2 diabetes mellitus ([place]) - kidneys have recovered from recent AKI (likely exacerbated by contrast) - Plan: Comprehensive metabolic panel  Medication monitoring encounter - Plan: CBC with Differential/Platelet, Comprehensive metabolic panel  Pleural effusion on right - recheck CXR to ensure  not recurring. This could be potential source for infection if increasing WBC/fever - Plan: DG Chest 2 View  Tinea cruris - Plan: fluconazole (DIFLUCAN) 150 MG tablet  Essential hypertension, benign - encouraged to monitor BP and record, goals reviewed. Cont amlodipine. Will leave up to nephro if/when ARB to be restarted  Controlled type 2 diabetes mellitus with stage 3 chronic kidney disease, with long-term current use of insulin (HCC) - A1c in hospital higher.  To check sugars regularly. Limit juices (prunes over the juice). Cont current meds  Cholecystostomy care ([place]) - Drain in place.  Decreasing output per wife.  Has f/u arranged with IR  Infection due to Streptococcus gallolyticus - sepsis last month; refer to GI due to association with colon cancer - Plan: Ambulatory referral to Gastroenterology  Secure chat with surgeon (Dr. Constance Haw) and ID (Dr. Prudencio Pair) this morning. He no-showed ID f/u visit (he was back in ER/hospital).  Recommendation from ID was to stop ABX.  Repeat labs in a week.  If worsening, will need CT repeated. They will contact pt to schedule ID f/u. Pt stated they called this morning and he has appt scheduled. Also has IR f/u on 10/27  Refer for colonoscopy (was due 04/2020, had Cologuard through GI, negative 07/2020)--Dr. Collene Mares

## 2020-11-11 NOTE — Progress Notes (Signed)
[facility] Note   HPI:  75 y.o. Male presents to clinic for follow-up evaluation of his cholecystomy tube and abscess drain. He had cholecystitis complicated by a liver abscess and bovis strep positive with recent colonoscopy reported. He says he is doing much better. No significant drainage from the JP drain in the superior perihepatic collection, the intrahepatic drain had been removed in the hospital. The cholecystostomy tube is putting about 50-75cc of bile a day.   He also had acute kidney injury during his hospitalization from contrast per the family and patient's report.   Review of Systems:  No fevers Improving strength and appetite  All other review of systems: otherwise negative   Vital Signs:  BP (!) 141/71   Pulse 90   Temp 97.8 F (36.6 C) (Other (Comment))   Resp 14   Ht '6\' 4"'$  (1.93 m)   Wt 264 lb (119.7 kg)   SpO2 91%   BMI 32.14 kg/m    Physical Exam:  Physical Exam Vitals reviewed.  Cardiovascular:     Rate and Rhythm: Normal rate.  Pulmonary:     Effort: Pulmonary effort is normal.  Abdominal:     General: There is no distension.     Palpations: Abdomen is soft.     Tenderness: There is no abdominal tenderness.     Comments: JP drain removed from superior perihepatic collection, serous fluid in bulb < 1cc, chole tube in place with bile  Skin:    General: Skin is warm.  Neurological:     General: No focal deficit present.  Psychiatric:        Mood and Affect: Mood normal.     Assessment:  75 y.o. yo Male with cholecystitis and liver abscess s/p drainage. Improving. Finishing up antibiotics. Has been 1 week released from hospital and drain removed, chole tube in place and IR will check in 6-8 weeks after placement.   Plan:  Finish your antibiotics.  Will get lab work and call you with the results. IR will call about the cholecystostomy tube study in a few weeks. Call if worsening pain, fevers or issues.  Dr. Tomi Bamberger should be able  to see the labs also.  Keep bandage on drain site that was removed for 48 hours.   Future Appointments  Date Time Provider [place]  11/12/2020 10:30 AM [name], MD PFM-PFM [place]  11/25/2020  9:15 AM Virl Cagey, MD RS-RS None  11/28/2020  9:30 AM Florance, Tomasa Blase, RN THN-COM None  03/09/2021  1:45 PM [name], MD PFM-PFM [facility]    All of the above recommendations were discussed with the patient and patient's family, and all of patient's and family's questions were answered to his/her/their expressed satisfaction.  Curlene Labrum, MD [facility] [address] [place], [place] [address] [phone] (office)

## 2020-11-11 NOTE — Patient Instructions (Signed)
Finish your antibiotics.  Will get lab work and call you with the results. IR will call about the cholecystostomy tube study in a few weeks. Call if worsening pain, fevers or issues.  Dr. Tomi Bamberger should be able to see the labs also.  Keep bandage on drain site that was removed for 48 hours.

## 2020-11-12 ENCOUNTER — Ambulatory Visit
Admission: RE | Admit: 2020-11-12 | Discharge: 2020-11-12 | Disposition: A | Discharge status: Home / Self Care | Payer: Medicare PPO | Source: Ambulatory Visit | Attending: Family Medicine | Admitting: Family Medicine

## 2020-11-12 ENCOUNTER — Ambulatory Visit: Payer: Medicare PPO | Admitting: Family Medicine

## 2020-11-12 ENCOUNTER — Encounter: Payer: Self-pay | Admitting: Family Medicine

## 2020-11-12 VITALS — BP 134/72 | HR 88 | Ht 76.0 in | Wt 262.8 lb

## 2020-11-12 DIAGNOSIS — Z5181 Encounter for therapeutic drug level monitoring: Secondary | ICD-10-CM | POA: Diagnosis not present

## 2020-11-12 DIAGNOSIS — K810 Acute cholecystitis: Secondary | ICD-10-CM | POA: Diagnosis not present

## 2020-11-12 DIAGNOSIS — E1122 Type 2 diabetes mellitus with diabetic chronic kidney disease: Secondary | ICD-10-CM | POA: Diagnosis not present

## 2020-11-12 DIAGNOSIS — J984 Other disorders of lung: Secondary | ICD-10-CM | POA: Diagnosis not present

## 2020-11-12 DIAGNOSIS — B356 Tinea cruris: Secondary | ICD-10-CM | POA: Diagnosis not present

## 2020-11-12 DIAGNOSIS — N183 Chronic kidney disease, stage 3 unspecified: Secondary | ICD-10-CM

## 2020-11-12 DIAGNOSIS — J90 Pleural effusion, not elsewhere classified: Secondary | ICD-10-CM

## 2020-11-12 DIAGNOSIS — Z434 Encounter for attention to other artificial openings of digestive tract: Secondary | ICD-10-CM

## 2020-11-12 DIAGNOSIS — I10 Essential (primary) hypertension: Secondary | ICD-10-CM | POA: Diagnosis not present

## 2020-11-12 DIAGNOSIS — A491 Streptococcal infection, unspecified site: Secondary | ICD-10-CM

## 2020-11-12 DIAGNOSIS — B954 Other streptococcus as the cause of diseases classified elsewhere: Secondary | ICD-10-CM

## 2020-11-12 DIAGNOSIS — R918 Other nonspecific abnormal finding of lung field: Secondary | ICD-10-CM | POA: Diagnosis not present

## 2020-11-12 DIAGNOSIS — J9811 Atelectasis: Secondary | ICD-10-CM | POA: Diagnosis not present

## 2020-11-12 DIAGNOSIS — J949 Pleural condition, unspecified: Secondary | ICD-10-CM | POA: Diagnosis not present

## 2020-11-12 DIAGNOSIS — Z794 Long term (current) use of insulin: Secondary | ICD-10-CM

## 2020-11-12 LAB — CBC WITH DIFFERENTIAL/PLATELET
Absolute Monocytes: 726 cells/uL (ref 200–950)
Basophils Absolute: 66 cells/uL (ref 0–200)
Basophils Relative: 0.5 %
Eosinophils Absolute: 92 cells/uL (ref 15–500)
Eosinophils Relative: 0.7 %
HCT: 29.9 % — ABNORMAL LOW (ref 38.5–50.0)
Hemoglobin: 9.7 g/dL — ABNORMAL LOW (ref 13.2–17.1)
Lymphs Abs: 581 cells/uL — ABNORMAL LOW (ref 850–3900)
MCH: 30.6 pg (ref 27.0–33.0)
MCHC: 32.4 g/dL (ref 32.0–36.0)
MCV: 94.3 fL (ref 80.0–100.0)
MPV: 10.9 fL (ref 7.5–12.5)
Monocytes Relative: 5.5 %
Neutro Abs: 11735 cells/uL — ABNORMAL HIGH (ref 1500–7800)
Neutrophils Relative %: 88.9 %
Platelets: 356 10*3/uL (ref 140–400)
RBC: 3.17 10*6/uL — ABNORMAL LOW (ref 4.20–5.80)
RDW: 12.8 % (ref 11.0–15.0)
Total Lymphocyte: 4.4 %
WBC: 13.2 10*3/uL — ABNORMAL HIGH (ref 3.8–10.8)

## 2020-11-12 LAB — COMPREHENSIVE METABOLIC PANEL
AG Ratio: 0.8 (calc) — ABNORMAL LOW (ref 1.0–2.5)
ALT: 13 U/L (ref 9–46)
AST: 11 U/L (ref 10–35)
Albumin: 2.9 g/dL — ABNORMAL LOW (ref 3.6–5.1)
Alkaline phosphatase (APISO): 56 U/L (ref 35–144)
BUN/Creatinine Ratio: 11 (calc) (ref 6–22)
BUN: 16 mg/dL (ref 7–25)
CO2: 26 mmol/L (ref 20–32)
Calcium: 8.5 mg/dL — ABNORMAL LOW (ref 8.6–10.3)
Chloride: 95 mmol/L — ABNORMAL LOW (ref 98–110)
Creat: 1.41 mg/dL — ABNORMAL HIGH (ref 0.70–1.28)
Globulin: 3.5 g/dL (calc) (ref 1.9–3.7)
Glucose, Bld: 173 mg/dL — ABNORMAL HIGH (ref 65–139)
Potassium: 4.5 mmol/L (ref 3.5–5.3)
Sodium: 131 mmol/L — ABNORMAL LOW (ref 135–146)
Total Bilirubin: 0.4 mg/dL (ref 0.2–1.2)
Total Protein: 6.4 g/dL (ref 6.1–8.1)

## 2020-11-12 MED ORDER — FLUCONAZOLE 150 MG PO TABS
150.0000 mg | ORAL_TABLET | ORAL | 0 refills | Status: DC
Start: 2020-11-12 — End: 2020-12-30

## 2020-11-12 NOTE — Patient Instructions (Addendum)
Please check your sugars at least a few times/week 2 hours after either lunch or dinner (or at bedtime).  Record these on your paper in a separate column from the morning sugars.  Continue your current medications. Keep an eye on your blood sugars.  If your fasting blood sugars are consistently over 125, you may increase your Tresiba dose to 20 Units.  Check your blood pressure 1-2x/week.  Goal is <130/80, up to 135-140/85-90 is borderline.  Today's was okay.  Be sure to schedule a follow-up visit with Dr. Marval Regal.  I spoke to your surgeon and the infectious disease doctor today.  The plan is to finish your Augmentin (last day today), and recheck bloodwork in 1 week.  Depending on those results, you may need more antibiotics and imaging procedure.  Go to [facility] ([address]) for a chest x-ray.  I want to make sure that the fluid is stable since it was drained from around the lung.  Take the fluconazole once a week for 2-4 weeks for the jock itch infection.  If it is better after 2 weeks, stop.  If you need to take all 4 weeks, you may (if rash persists). If it isn't improving despite this medication let us know. Be sure to keep the area dry--continue to use powder.  Do not take the simvastatin while taking the fluconazole (2-4 weeks) Resume simvastatin once your infection is cleared and you aren't taking the fluconazole.

## 2020-11-13 ENCOUNTER — Telehealth: Payer: Self-pay | Admitting: Internal Medicine

## 2020-11-13 DIAGNOSIS — N183 Chronic kidney disease, stage 3 unspecified: Secondary | ICD-10-CM | POA: Diagnosis not present

## 2020-11-13 DIAGNOSIS — I1310 Hypertensive heart and chronic kidney disease without heart failure, with stage 1 through stage 4 chronic kidney disease, or unspecified chronic kidney disease: Secondary | ICD-10-CM | POA: Diagnosis not present

## 2020-11-13 DIAGNOSIS — I2510 Atherosclerotic heart disease of native coronary artery without angina pectoris: Secondary | ICD-10-CM | POA: Diagnosis not present

## 2020-11-13 DIAGNOSIS — K750 Abscess of liver: Secondary | ICD-10-CM | POA: Diagnosis not present

## 2020-11-13 DIAGNOSIS — M47814 Spondylosis without myelopathy or radiculopathy, thoracic region: Secondary | ICD-10-CM | POA: Diagnosis not present

## 2020-11-13 DIAGNOSIS — E1122 Type 2 diabetes mellitus with diabetic chronic kidney disease: Secondary | ICD-10-CM | POA: Diagnosis not present

## 2020-11-13 DIAGNOSIS — K8689 Other specified diseases of pancreas: Secondary | ICD-10-CM | POA: Diagnosis not present

## 2020-11-13 DIAGNOSIS — A409 Streptococcal sepsis, unspecified: Secondary | ICD-10-CM | POA: Diagnosis not present

## 2020-11-13 DIAGNOSIS — K810 Acute cholecystitis: Secondary | ICD-10-CM | POA: Diagnosis not present

## 2020-11-13 NOTE — Telephone Encounter (Signed)
Noted  

## 2020-11-13 NOTE — Telephone Encounter (Signed)
Michelle with OT did a home assessment with patient and he no longer needs Home Health services

## 2020-11-14 DIAGNOSIS — I1310 Hypertensive heart and chronic kidney disease without heart failure, with stage 1 through stage 4 chronic kidney disease, or unspecified chronic kidney disease: Secondary | ICD-10-CM | POA: Diagnosis not present

## 2020-11-14 DIAGNOSIS — E1122 Type 2 diabetes mellitus with diabetic chronic kidney disease: Secondary | ICD-10-CM | POA: Diagnosis not present

## 2020-11-14 DIAGNOSIS — I2510 Atherosclerotic heart disease of native coronary artery without angina pectoris: Secondary | ICD-10-CM | POA: Diagnosis not present

## 2020-11-14 DIAGNOSIS — N183 Chronic kidney disease, stage 3 unspecified: Secondary | ICD-10-CM | POA: Diagnosis not present

## 2020-11-14 DIAGNOSIS — K810 Acute cholecystitis: Secondary | ICD-10-CM | POA: Diagnosis not present

## 2020-11-14 DIAGNOSIS — A409 Streptococcal sepsis, unspecified: Secondary | ICD-10-CM | POA: Diagnosis not present

## 2020-11-14 DIAGNOSIS — M47814 Spondylosis without myelopathy or radiculopathy, thoracic region: Secondary | ICD-10-CM | POA: Diagnosis not present

## 2020-11-14 DIAGNOSIS — K750 Abscess of liver: Secondary | ICD-10-CM | POA: Diagnosis not present

## 2020-11-14 DIAGNOSIS — K8689 Other specified diseases of pancreas: Secondary | ICD-10-CM | POA: Diagnosis not present

## 2020-11-18 DIAGNOSIS — K810 Acute cholecystitis: Secondary | ICD-10-CM | POA: Diagnosis not present

## 2020-11-18 DIAGNOSIS — Z1211 Encounter for screening for malignant neoplasm of colon: Secondary | ICD-10-CM | POA: Diagnosis not present

## 2020-11-18 DIAGNOSIS — K219 Gastro-esophageal reflux disease without esophagitis: Secondary | ICD-10-CM | POA: Diagnosis not present

## 2020-11-18 DIAGNOSIS — K5730 Diverticulosis of large intestine without perforation or abscess without bleeding: Secondary | ICD-10-CM | POA: Diagnosis not present

## 2020-11-19 ENCOUNTER — Other Ambulatory Visit: Payer: Medicare PPO

## 2020-11-19 ENCOUNTER — Other Ambulatory Visit: Payer: Self-pay

## 2020-11-19 DIAGNOSIS — K750 Abscess of liver: Secondary | ICD-10-CM | POA: Diagnosis not present

## 2020-11-19 DIAGNOSIS — N183 Chronic kidney disease, stage 3 unspecified: Secondary | ICD-10-CM | POA: Diagnosis not present

## 2020-11-19 DIAGNOSIS — K810 Acute cholecystitis: Secondary | ICD-10-CM | POA: Diagnosis not present

## 2020-11-19 DIAGNOSIS — M47814 Spondylosis without myelopathy or radiculopathy, thoracic region: Secondary | ICD-10-CM | POA: Diagnosis not present

## 2020-11-19 DIAGNOSIS — Z5181 Encounter for therapeutic drug level monitoring: Secondary | ICD-10-CM

## 2020-11-19 DIAGNOSIS — E1122 Type 2 diabetes mellitus with diabetic chronic kidney disease: Secondary | ICD-10-CM

## 2020-11-19 DIAGNOSIS — I1310 Hypertensive heart and chronic kidney disease without heart failure, with stage 1 through stage 4 chronic kidney disease, or unspecified chronic kidney disease: Secondary | ICD-10-CM | POA: Diagnosis not present

## 2020-11-19 DIAGNOSIS — I2510 Atherosclerotic heart disease of native coronary artery without angina pectoris: Secondary | ICD-10-CM | POA: Diagnosis not present

## 2020-11-19 DIAGNOSIS — A409 Streptococcal sepsis, unspecified: Secondary | ICD-10-CM | POA: Diagnosis not present

## 2020-11-19 DIAGNOSIS — K8689 Other specified diseases of pancreas: Secondary | ICD-10-CM | POA: Diagnosis not present

## 2020-11-20 LAB — COMPREHENSIVE METABOLIC PANEL
ALT: 7 IU/L (ref 0–44)
AST: 11 IU/L (ref 0–40)
Albumin/Globulin Ratio: 0.9 — ABNORMAL LOW (ref 1.2–2.2)
Albumin: 3.2 g/dL — ABNORMAL LOW (ref 3.7–4.7)
Alkaline Phosphatase: 67 IU/L (ref 44–121)
BUN/Creatinine Ratio: 13 (ref 10–24)
BUN: 17 mg/dL (ref 8–27)
Bilirubin Total: 0.3 mg/dL (ref 0.0–1.2)
CO2: 21 mmol/L (ref 20–29)
Calcium: 8.9 mg/dL (ref 8.6–10.2)
Chloride: 95 mmol/L — ABNORMAL LOW (ref 96–106)
Creatinine, Ser: 1.36 mg/dL — ABNORMAL HIGH (ref 0.76–1.27)
Globulin, Total: 3.6 g/dL (ref 1.5–4.5)
Glucose: 108 mg/dL — ABNORMAL HIGH (ref 65–99)
Potassium: 4.6 mmol/L (ref 3.5–5.2)
Sodium: 134 mmol/L (ref 134–144)
Total Protein: 6.8 g/dL (ref 6.0–8.5)
eGFR: 55 mL/min/{1.73_m2} — ABNORMAL LOW (ref >59)

## 2020-11-20 LAB — CBC WITH DIFFERENTIAL/PLATELET
Basophils Absolute: 0.1 10*3/uL (ref 0.0–0.2)
Basos: 1 %
EOS (ABSOLUTE): 0.3 10*3/uL (ref 0.0–0.4)
Eos: 4 %
Hematocrit: 29 % — ABNORMAL LOW (ref 37.5–51.0)
Hemoglobin: 9.4 g/dL — ABNORMAL LOW (ref 13.0–17.7)
Immature Grans (Abs): 0 10*3/uL (ref 0.0–0.1)
Immature Granulocytes: 1 %
Lymphocytes Absolute: 0.6 10*3/uL — ABNORMAL LOW (ref 0.7–3.1)
Lymphs: 8 %
MCH: 29.7 pg (ref 26.6–33.0)
MCHC: 32.4 g/dL (ref 31.5–35.7)
MCV: 92 fL (ref 79–97)
Monocytes Absolute: 0.8 10*3/uL (ref 0.1–0.9)
Monocytes: 10 %
Neutrophils Absolute: 6.3 10*3/uL (ref 1.4–7.0)
Neutrophils: 76 %
Platelets: 297 10*3/uL (ref 150–450)
RBC: 3.16 x10E6/uL — ABNORMAL LOW (ref 4.14–5.80)
RDW: 13.2 % (ref 11.6–15.4)
WBC: 8.1 10*3/uL (ref 3.4–10.8)

## 2020-11-21 DIAGNOSIS — N1831 Chronic kidney disease, stage 3a: Secondary | ICD-10-CM | POA: Diagnosis not present

## 2020-11-21 DIAGNOSIS — M47814 Spondylosis without myelopathy or radiculopathy, thoracic region: Secondary | ICD-10-CM | POA: Diagnosis not present

## 2020-11-21 DIAGNOSIS — N183 Chronic kidney disease, stage 3 unspecified: Secondary | ICD-10-CM | POA: Diagnosis not present

## 2020-11-21 DIAGNOSIS — I129 Hypertensive chronic kidney disease with stage 1 through stage 4 chronic kidney disease, or unspecified chronic kidney disease: Secondary | ICD-10-CM | POA: Diagnosis not present

## 2020-11-21 DIAGNOSIS — C61 Malignant neoplasm of prostate: Secondary | ICD-10-CM | POA: Diagnosis not present

## 2020-11-21 DIAGNOSIS — K810 Acute cholecystitis: Secondary | ICD-10-CM | POA: Diagnosis not present

## 2020-11-21 DIAGNOSIS — E1122 Type 2 diabetes mellitus with diabetic chronic kidney disease: Secondary | ICD-10-CM | POA: Diagnosis not present

## 2020-11-21 DIAGNOSIS — I2510 Atherosclerotic heart disease of native coronary artery without angina pectoris: Secondary | ICD-10-CM | POA: Diagnosis not present

## 2020-11-21 DIAGNOSIS — I1310 Hypertensive heart and chronic kidney disease without heart failure, with stage 1 through stage 4 chronic kidney disease, or unspecified chronic kidney disease: Secondary | ICD-10-CM | POA: Diagnosis not present

## 2020-11-21 DIAGNOSIS — A409 Streptococcal sepsis, unspecified: Secondary | ICD-10-CM | POA: Diagnosis not present

## 2020-11-21 DIAGNOSIS — K8689 Other specified diseases of pancreas: Secondary | ICD-10-CM | POA: Diagnosis not present

## 2020-11-21 DIAGNOSIS — R600 Localized edema: Secondary | ICD-10-CM | POA: Diagnosis not present

## 2020-11-21 DIAGNOSIS — E611 Iron deficiency: Secondary | ICD-10-CM | POA: Diagnosis not present

## 2020-11-21 DIAGNOSIS — K750 Abscess of liver: Secondary | ICD-10-CM | POA: Diagnosis not present

## 2020-11-21 DIAGNOSIS — E785 Hyperlipidemia, unspecified: Secondary | ICD-10-CM | POA: Diagnosis not present

## 2020-11-21 DIAGNOSIS — N2581 Secondary hyperparathyroidism of renal origin: Secondary | ICD-10-CM | POA: Diagnosis not present

## 2020-11-24 DIAGNOSIS — K810 Acute cholecystitis: Secondary | ICD-10-CM | POA: Diagnosis not present

## 2020-11-24 DIAGNOSIS — E1122 Type 2 diabetes mellitus with diabetic chronic kidney disease: Secondary | ICD-10-CM | POA: Diagnosis not present

## 2020-11-24 DIAGNOSIS — K750 Abscess of liver: Secondary | ICD-10-CM | POA: Diagnosis not present

## 2020-11-24 DIAGNOSIS — I1310 Hypertensive heart and chronic kidney disease without heart failure, with stage 1 through stage 4 chronic kidney disease, or unspecified chronic kidney disease: Secondary | ICD-10-CM | POA: Diagnosis not present

## 2020-11-24 DIAGNOSIS — N183 Chronic kidney disease, stage 3 unspecified: Secondary | ICD-10-CM | POA: Diagnosis not present

## 2020-11-24 DIAGNOSIS — I2510 Atherosclerotic heart disease of native coronary artery without angina pectoris: Secondary | ICD-10-CM | POA: Diagnosis not present

## 2020-11-24 DIAGNOSIS — A409 Streptococcal sepsis, unspecified: Secondary | ICD-10-CM | POA: Diagnosis not present

## 2020-11-24 DIAGNOSIS — K8689 Other specified diseases of pancreas: Secondary | ICD-10-CM | POA: Diagnosis not present

## 2020-11-24 DIAGNOSIS — M47814 Spondylosis without myelopathy or radiculopathy, thoracic region: Secondary | ICD-10-CM | POA: Diagnosis not present

## 2020-11-25 ENCOUNTER — Other Ambulatory Visit: Payer: Self-pay | Admitting: Family Medicine

## 2020-11-25 ENCOUNTER — Ambulatory Visit: Payer: Medicare PPO | Admitting: General Surgery

## 2020-11-25 ENCOUNTER — Encounter: Payer: Self-pay | Admitting: General Surgery

## 2020-11-25 VITALS — BP 135/76 | HR 66 | Temp 98.3°F | Resp 14 | Ht 76.0 in | Wt 249.0 lb

## 2020-11-25 DIAGNOSIS — E1122 Type 2 diabetes mellitus with diabetic chronic kidney disease: Secondary | ICD-10-CM

## 2020-11-25 DIAGNOSIS — K810 Acute cholecystitis: Secondary | ICD-10-CM | POA: Diagnosis not present

## 2020-11-25 DIAGNOSIS — N183 Chronic kidney disease, stage 3 unspecified: Secondary | ICD-10-CM

## 2020-11-25 DIAGNOSIS — Z794 Long term (current) use of insulin: Secondary | ICD-10-CM

## 2020-11-25 DIAGNOSIS — K750 Abscess of liver: Secondary | ICD-10-CM

## 2020-11-25 NOTE — Progress Notes (Signed)
[facility] Note   HPI:  75 y.o. Male presents to clinic for follow-up evaluation of his cholecystostomy tube. He has normal labs and has no fevers. He is feeling stronger. He is walking without a walk. Having more appetite   Review of Systems:  No pain Chole tube putting out less  All other review of systems: otherwise negative   Vital Signs:  BP 135/76   Pulse 66   Temp 98.3 F (36.8 C) (Other (Comment))   Resp 14   Ht 6\' 4"  (1.93 m)   Wt 249 lb (112.9 kg)   SpO2 97%   BMI 30.31 kg/m    Physical Exam:  Physical Exam Vitals reviewed.  Cardiovascular:     Rate and Rhythm: Normal rate.  Pulmonary:     Effort: Pulmonary effort is normal.  Abdominal:     General: There is no distension.     Palpations: Abdomen is soft.     Tenderness: There is no abdominal tenderness.     Comments: Chole tube with cloudy brownish fluid  Neurological:     Mental Status: He is alert.      Assessment:  75 y.o. yo Male with recent gangrenous cholecystitis and liver abscess, cholecystostomy tube in place. Other drains out, and no fevers and no leukocytosis. Completed 1 month course antibiotics for bacteremia. Had STREPTOCOCCUS GALLOLYTICUS in the blood and ID had said would need colonoscopy.   Plan:  Continuing flushing cholecystotomy tube. Continue recording output.  Go see ID, Dr.Vu. Call with fevers, changes in appetite, nausea, looking yellow/ jaundice.  Will ultimately need colonoscopy. Will try to plan for cholecystectomy after IR sees you 12/25/2020.   Future Appointments  Date Time Provider [place]  11/28/2020  9:30 AM Florance, Tomasa Blase, RN THN-COM None  12/05/2020 10:00 AM Jabier Mutton, MD [facility]  12/25/2020  1:00 PM [facility] DG 1 ([place]) [facility]  12/25/2020  1:00 PM [facility] IR [facility]  12/30/2020 11:30 AM Virl Cagey, MD RS-RS None  03/09/2021  1:45 PM [name], MD PFM-PFM [place],  MD [facility] [address] [place], [place] [address] [phone] (office)

## 2020-11-25 NOTE — Patient Instructions (Addendum)
Continuing flushing cholecystotomy tube. Continue recording output.  Go see ID, Dr.Vu. Call with fevers, changes in appetite, nausea, looking yellow/ jaundice.  Will ultimately need colonoscopy. Will try to plan for cholecystectomy after IR sees you 12/25/2020.

## 2020-11-26 DIAGNOSIS — A409 Streptococcal sepsis, unspecified: Secondary | ICD-10-CM | POA: Diagnosis not present

## 2020-11-26 DIAGNOSIS — M47814 Spondylosis without myelopathy or radiculopathy, thoracic region: Secondary | ICD-10-CM | POA: Diagnosis not present

## 2020-11-26 DIAGNOSIS — E1122 Type 2 diabetes mellitus with diabetic chronic kidney disease: Secondary | ICD-10-CM | POA: Diagnosis not present

## 2020-11-26 DIAGNOSIS — N183 Chronic kidney disease, stage 3 unspecified: Secondary | ICD-10-CM | POA: Diagnosis not present

## 2020-11-26 DIAGNOSIS — K810 Acute cholecystitis: Secondary | ICD-10-CM | POA: Diagnosis not present

## 2020-11-26 DIAGNOSIS — I1310 Hypertensive heart and chronic kidney disease without heart failure, with stage 1 through stage 4 chronic kidney disease, or unspecified chronic kidney disease: Secondary | ICD-10-CM | POA: Diagnosis not present

## 2020-11-26 DIAGNOSIS — K8689 Other specified diseases of pancreas: Secondary | ICD-10-CM | POA: Diagnosis not present

## 2020-11-26 DIAGNOSIS — I2510 Atherosclerotic heart disease of native coronary artery without angina pectoris: Secondary | ICD-10-CM | POA: Diagnosis not present

## 2020-11-26 DIAGNOSIS — K750 Abscess of liver: Secondary | ICD-10-CM | POA: Diagnosis not present

## 2020-11-28 ENCOUNTER — Other Ambulatory Visit: Payer: Self-pay

## 2020-11-28 NOTE — Patient Outreach (Signed)
[facility]) Care Management  11/28/2020  ARNEL WYMER 09/12/1945 [id number]   Telephone  Assessment   Successful outreach call to the home. Spoke with caregiver/spouse. She is pleased to share patient's progress and recovery. She denies any RN CM needs or concerns at this time.    Medications Reviewed Today     Reviewed by Hayden Pedro, RN (Registered Nurse) on 11/28/20 at [place] List Status: <None>   Medication Order Taking? Sig Documenting Provider Last Dose Status Informant  ACCU-CHEK GUIDE test strip [id number] No USE AS DIRECTED [name], MD Taking Active Spouse/Significant Other  amLODipine (NORVASC) 10 MG tablet [id number] No Take 1 tablet (10 mg total) by mouth daily. Nita Sells, MD Taking Active   aspirin 81 MG tablet [id number] No Take 81 mg by mouth daily. [provider] Taking Active Spouse/Significant Other  famotidine (PEPCID) 40 MG tablet [id number] No Take 40 mg by mouth at bedtime.  Patient not taking: No sig reported   [provider] Not Taking Active   fluconazole (DIFLUCAN) 150 MG tablet [id number] No Take 1 tablet (150 mg total) by mouth once a week. Take 1 tablet by mouth once a week for 2-4 weeks to treat jock itch fungal infection [name], MD Taking Active   furosemide (LASIX) 20 MG tablet [id number] No Take 1 tablet (20 mg total) by mouth daily.  Patient taking differently: Take 20 mg by mouth daily. 20 mg - 40mg  alternating qod   Nita Sells, MD Taking Active   Multiple Vitamins-Minerals (MULTIVITAMIN WITH MINERALS) tablet [id number] No Take 1 tablet by mouth daily. [provider] Taking Active Spouse/Significant Other  Omega-3 Fatty Acids (FISH OIL) 1000 MG CAPS [id number] No Take 1,000 mg by mouth daily. [provider] Taking Active Spouse/Significant Other  omeprazole (PRILOSEC) 40 MG capsule [id number] No TAKE 1 CAPSULE(40 MG) BY MOUTH IN THE MORNING AND AT BEDTIME   Patient taking differently: Take 40 mg by mouth in the morning and at bedtime.   [name], MD Taking Active   oxybutynin (DITROPAN) 5 MG tablet [id number] No Take 5 mg by mouth daily. [provider] Taking Active Spouse/Significant Other  pioglitazone (ACTOS) 45 MG tablet [id number] No Take 1 tablet (45 mg total) by mouth daily. [name], MD Taking Active Spouse/Significant Other  saxagliptin HCl (ONGLYZA) 2.5 MG TABS tablet [id number] No TAKE 1 TABLET(2.5 MG) BY MOUTH DAILY  Patient taking differently: Take 2.5 mg by mouth daily.   [name], MD Taking Active   simvastatin (ZOCOR) 20 MG tablet [id number] No TAKE 1 TABLET(20 MG) BY MOUTH AT BEDTIME  Patient taking differently: Take 20 mg by mouth at bedtime.   [name], MD Taking Active   tamsulosin [facility]) 0.4 MG CAPS capsule [id number] No Take 1 capsule (0.4 mg total) by mouth 2 (two) times daily after a meal. For urinary urgency or weak stream. Freeman Caldron, PA-C Taking Active Spouse/Significant Other           Med Note Ardine Eng Oct 15, 2020  9:03 PM)    TRESIBA FLEXTOUCH 100 UNIT/ML FlexTouch Pen [id number]  ADMINISTER 18 UNITS UNDER THE SKIN AT BEDTIME [name], MD  Active               Goals Addressed               This Visit's Progress     (THN)Make and Keep All Appointments (pt-stated)  Timeframe:  Short-Term Goal Priority:  High Start Date:  11/07/2020                           Expected End Date:  Oct 2022                     Follow Up Date Oct 2022   Barriers: None    - call to cancel if needed - keep a calendar with appointment dates    Why is this important?   Part of staying healthy is seeing the doctor for follow-up care.  If you forget your appointments, there are some things you can do to stay on track.    Notes:   11/07/20-Spouse able to review list of upcoming MD appts with several MDs. She will be taking patient to appt. Patient also awaiting further appt date  for gallbladder surgery.   11/28/20-Spouse reviewed and shared calendar of MD appts. Patient has several upcoming appts with ID,surgeon and IR.      (THN)Protect My Health (pt-stated)        Timeframe:  Short-Term Goal Priority:  High Start Date:   11/07/2020                          Expected End Date:  Oct 2022                     Follow Up Date Oct 2022  Barriers: Knowledge    -pt/spouse will monitor abscess and chest tube drain -pt/spouse will be seek medical attention as needed -pt will have no s/s of infection sites   Why is this important?   Screening tests can find diseases early when they are easier to treat.  Your doctor or nurse will talk with you about which tests are important for you.  Getting shots for common diseases like the flu and shingles will help prevent them.     Notes:   11/07/20-Patient discharged home with abscess drain. Spouse has gotten education from hospital and [facility] regarding mgmt and has been performing daily care as needed.   11/28/20-Spouse continues to perform daily care to drain tube. She reports patient only putting out about 10-25cc of fluid. She is using daily. No s/s of infection. Patient saw MD earlier this week and got good report on drain status. He goes back for further testing with IR on 12/25/20 and then it will be determined when he can have surgery.          Plan: RN CM discussed with patient next outreach 4-5wks. Caregiver agrees to care plan and follow up. Caregiver gave verbal consent and in agreement with RN CM follow up and timeframe. Caregiver aware that they may contact RN CM sooner for any issues or concerns. RN CM reviewed goals and plan of care with caregiver.   Enzo Montgomery, RN,BSN,CCM [place] [facility] Telephonic Care Management Coordinator Direct Phone: [phone] Toll Free: [phone] Fax: [phone]

## 2020-12-01 DIAGNOSIS — I1310 Hypertensive heart and chronic kidney disease without heart failure, with stage 1 through stage 4 chronic kidney disease, or unspecified chronic kidney disease: Secondary | ICD-10-CM | POA: Diagnosis not present

## 2020-12-01 DIAGNOSIS — K750 Abscess of liver: Secondary | ICD-10-CM | POA: Diagnosis not present

## 2020-12-01 DIAGNOSIS — E1122 Type 2 diabetes mellitus with diabetic chronic kidney disease: Secondary | ICD-10-CM | POA: Diagnosis not present

## 2020-12-01 DIAGNOSIS — M47814 Spondylosis without myelopathy or radiculopathy, thoracic region: Secondary | ICD-10-CM | POA: Diagnosis not present

## 2020-12-01 DIAGNOSIS — K810 Acute cholecystitis: Secondary | ICD-10-CM | POA: Diagnosis not present

## 2020-12-01 DIAGNOSIS — A409 Streptococcal sepsis, unspecified: Secondary | ICD-10-CM | POA: Diagnosis not present

## 2020-12-01 DIAGNOSIS — N183 Chronic kidney disease, stage 3 unspecified: Secondary | ICD-10-CM | POA: Diagnosis not present

## 2020-12-01 DIAGNOSIS — K8689 Other specified diseases of pancreas: Secondary | ICD-10-CM | POA: Diagnosis not present

## 2020-12-01 DIAGNOSIS — I2510 Atherosclerotic heart disease of native coronary artery without angina pectoris: Secondary | ICD-10-CM | POA: Diagnosis not present

## 2020-12-04 ENCOUNTER — Telehealth: Payer: Self-pay

## 2020-12-04 NOTE — Telephone Encounter (Signed)
Ok for order?  

## 2020-12-04 NOTE — Telephone Encounter (Signed)
Inez Catalina from [facility] called to get verbal orders to continue skilled nursing for 1 time a week for 5 weeks to help pt. With cholecystostomy tube. She can be reached at [phone].

## 2020-12-04 NOTE — Telephone Encounter (Signed)
Verbal orders given  

## 2020-12-05 ENCOUNTER — Ambulatory Visit: Payer: Medicare PPO | Admitting: Internal Medicine

## 2020-12-05 ENCOUNTER — Other Ambulatory Visit: Payer: Self-pay

## 2020-12-05 VITALS — BP 140/77 | HR 101 | Wt 250.0 lb

## 2020-12-05 DIAGNOSIS — K750 Abscess of liver: Secondary | ICD-10-CM

## 2020-12-05 DIAGNOSIS — A491 Streptococcal infection, unspecified site: Secondary | ICD-10-CM | POA: Diagnosis not present

## 2020-12-05 DIAGNOSIS — K819 Cholecystitis, unspecified: Secondary | ICD-10-CM | POA: Diagnosis not present

## 2020-12-05 NOTE — Progress Notes (Signed)
[facility] for Infectious Disease  Patient Active Problem List   Diagnosis Date Noted   Pressure injury of skin 10/24/2020   Cholecystitis 10/23/2020   Sepsis ([place]) 10/21/2020   CKD stage 3 due to type 2 diabetes mellitus ([place]) 10/21/2020   Hyperlipidemia associated with type 2 diabetes mellitus ([place]) 10/21/2020   BPH without urinary obstruction 10/21/2020   Thrombocytopenia ([place]) 10/21/2020   Anemia with chronic illness 10/21/2020   Gangrenous cholecystitis 10/16/2020   GERD without esophagitis 10/16/2020   Liver abscess    Aortic atherosclerosis ([place]) 02/24/2020   Controlled type 2 diabetes mellitus with stage 3 chronic kidney disease, with long-term current use of insulin ([place]) 02/20/2020   Secondary hyperparathyroidism of renal origin ([place]) 02/20/2020   Anemia due to vitamin B12 deficiency 08/08/2019   Prostate cancer ([place]) 06/14/2018   CKD (chronic kidney disease) stage 3, GFR 30-59 ml/min ([place]) 02/01/2018   Type II diabetes mellitus with nephropathy ([place]) 02/15/2013   HTN (hypertension) 01/27/2013   Obesity (BMI 30-39.9) 10/02/2012   Uncontrolled type II diabetes mellitus with nephropathy ([place]) 06/21/2012   Essential hypertension, benign 07/29/2011   Male hypogonadism 07/29/2011   Dyslipidemia 07/29/2011      Subjective:    Patient ID: James Glover, male    DOB: September 28, 1945, 75 y.o.   MRN: [id number]  No chief complaint on file.   HPI:  James Glover is a 75 y.o. male here for f/u perihepatic fluid collection, hx cholecystitis  He saw dr bridges of surgery 9/27 who plans for cholecystectomy early 12/2020. His drains around the perihepatic fluid collections were removed. He finished abx 9/14  He continues to do well off abx No n/v/diarrhea Eating well   Allergies  Allergen Reactions   Ace Inhibitors Cough   Statins Cough      Outpatient Medications Prior to Visit  Medication Sig Dispense Refill   ACCU-CHEK GUIDE test strip USE AS  DIRECTED 100 strip 5   amLODipine (NORVASC) 10 MG tablet Take 1 tablet (10 mg total) by mouth daily. 30 tablet 2   aspirin 81 MG tablet Take 81 mg by mouth daily.     famotidine (PEPCID) 40 MG tablet Take 40 mg by mouth at bedtime. (Patient not taking: No sig reported)     fluconazole (DIFLUCAN) 150 MG tablet Take 1 tablet (150 mg total) by mouth once a week. Take 1 tablet by mouth once a week for 2-4 weeks to treat jock itch fungal infection 4 tablet 0   furosemide (LASIX) 20 MG tablet Take 1 tablet (20 mg total) by mouth daily. (Patient taking differently: Take 20 mg by mouth daily. 20 mg - 40mg  alternating qod) 30 tablet 0   Multiple Vitamins-Minerals (MULTIVITAMIN WITH MINERALS) tablet Take 1 tablet by mouth daily.     Omega-3 Fatty Acids (FISH OIL) 1000 MG CAPS Take 1,000 mg by mouth daily.     omeprazole (PRILOSEC) 40 MG capsule TAKE 1 CAPSULE(40 MG) BY MOUTH IN THE MORNING AND AT BEDTIME (Patient taking differently: Take 40 mg by mouth in the morning and at bedtime.) 180 capsule 0   oxybutynin (DITROPAN) 5 MG tablet Take 5 mg by mouth daily.     pioglitazone (ACTOS) 45 MG tablet Take 1 tablet (45 mg total) by mouth daily. 90 tablet 1   saxagliptin HCl (ONGLYZA) 2.5 MG TABS tablet TAKE 1 TABLET(2.5 MG) BY MOUTH DAILY (Patient taking differently: Take 2.5 mg by mouth daily.) 90 tablet 1  simvastatin (ZOCOR) 20 MG tablet TAKE 1 TABLET(20 MG) BY MOUTH AT BEDTIME (Patient taking differently: Take 20 mg by mouth at bedtime.) 90 tablet 1   tamsulosin (FLOMAX) 0.4 MG CAPS capsule Take 1 capsule (0.4 mg total) by mouth 2 (two) times daily after a meal. For urinary urgency or weak stream. 60 capsule 5   TRESIBA FLEXTOUCH 100 UNIT/ML FlexTouch Pen ADMINISTER 18 UNITS UNDER THE SKIN AT BEDTIME 9 mL 1   No facility-administered medications prior to visit.     Social History   Socioeconomic History   Marital status: Married    Spouse name: Not on file   Number of children: 0   Years of education:  Not on file   Highest education level: Not on file  Occupational History   Occupation: police officer--retired    Employer: [employer]  Tobacco Use   Smoking status: Never   Smokeless tobacco: Never  Vaping Use   Vaping Use: Never used  Substance and Sexual Activity   Alcohol use: Yes    Alcohol/week: 3.0 standard drinks    Types: 3 Glasses of wine per week    Comment: 3 glasses of wine per week   Drug use: Never   Sexual activity: Yes  Other Topics Concern   Not on file  Social History Narrative   Retired 06/2011.  Lives with wife, 1 cat.   Has a place on [place], spends 1-2 weeks/month there   Has 9 siblings      Updated 01/2020   Social Determinants of Health   Financial Resource Strain: Not on file  Food Insecurity: No Food Insecurity   Worried About Charity fundraiser in the Last Year: Never true   Arboriculturist in the Last Year: Never true  Transportation Needs: No Transportation Needs   Lack of Transportation (Medical): No   Lack of Transportation (Non-Medical): No  Physical Activity: Not on file  Stress: Not on file  Social Connections: Not on file  Intimate Partner Violence: Not on file      Review of Systems    All other ros negative  Objective:    BP 140/77   Pulse (!) 101   Wt 250 lb (113.4 kg)   BMI 30.43 kg/m  Nursing note and vital signs reviewed.  Physical Exam     General/constitutional: no distress, pleasant HEENT: Normocephalic, PER, Conj Clear, EOMI, Oropharynx clear Neck supple CV: rrr no mrg Lungs: clear to auscultation, normal respiratory effort Abd: Soft, Nontender Ext: no edema Skin: No Rash Neuro: nonfocal MSK: no peripheral joint swelling/tenderness/warmth; back spines nontender   Labs:  Micro:  Serology:  Imaging: 9/1 abd ct Marked enlargement of RIGHT-sided pleural fluid collection with basilar and generalized volume loss throughout the RIGHT lung since the previous study. Superior perihepatic  drain may traverse the now expanded pleural space. While pleural fluid in the RIGHT chest may be reactive, the possibility of chest involvement from abdominal infection is considered based on the above constellation of findings. Fluid sampling and/or drainage as warranted is suggested.   Diminished size of intrahepatic pericholecystic abscess with decompression of the gallbladder and near complete resolution of perihepatic fluid.   Assessment & Plan:   Problem List Items Addressed This Visit       Digestive   Liver abscess - Primary   Cholecystitis   Other Visit Diagnoses     Streptococcus bovis infection  No orders of the defined types were placed in this encounter.    Cc: strep bovis bacteremia/liver abscess        Abx: 9/06-9/14 amox-clav 8/18-9/06 piptazo --> amp/sulb   8/17 vanc/cefepime/flagyl       A/p: Cholecystitis Liver fluid collection Bacteremia   Tte no sign of endocarditis Patient without surgical intervention during admission due to comorbidity Patient remained on iv abx during admission and changed to amox-clav planned 4 weeks until 9/16  He had percutanous cholecystotomy tube placed during hospital  We had discussed getting outpatient colonoscopy for strep bovis bacteremia  --------- 10/7 assessment Doing well. The cholecystotomy tube continues to put out green/yellowish output about 25 cc every 2 days. He is flushing the line daily  Dr Constance Haw is planning on doing cholecystectomy in 12/2020 (appointment is 11/01) - has cholangiogram 10/27 with IR  He also had 2 percutaneous drains that had been removed (1 during admission, and 1 during visit with Dr Constance Haw a few weeks ago)  -continue to stay off abx -given clinical significant improvement and remains so off abx, reasonable to defer abdominal ct scan  -pending near future colonoscopy -if fever chill malaise, abd pain call my clinic -follow up with Dr Constance Haw and IR as  planned with them -no need to follow up with ID at this time  Follow-up: Return if symptoms worsen or fail to improve.      Jabier Mutton, [facility] [phone]  pager   [phone] cell 12/05/2020, 10:17 AM

## 2020-12-05 NOTE — Patient Instructions (Signed)
-  continue to stay off abx -given clinical significant improvement and remains so off abx, reasonable to defer abdominal ct scan  -pending near future colonoscopy -if fever chill malaise, abd pain call my clinic -follow up with Dr Constance Haw and IR as planned with them -no need to follow up with ID at this time   Thank you for seeing [place] today

## 2020-12-07 ENCOUNTER — Other Ambulatory Visit: Payer: Self-pay | Admitting: Family Medicine

## 2020-12-07 DIAGNOSIS — K219 Gastro-esophageal reflux disease without esophagitis: Secondary | ICD-10-CM

## 2020-12-08 DIAGNOSIS — M47814 Spondylosis without myelopathy or radiculopathy, thoracic region: Secondary | ICD-10-CM | POA: Diagnosis not present

## 2020-12-08 DIAGNOSIS — I2510 Atherosclerotic heart disease of native coronary artery without angina pectoris: Secondary | ICD-10-CM | POA: Diagnosis not present

## 2020-12-08 DIAGNOSIS — I1310 Hypertensive heart and chronic kidney disease without heart failure, with stage 1 through stage 4 chronic kidney disease, or unspecified chronic kidney disease: Secondary | ICD-10-CM | POA: Diagnosis not present

## 2020-12-08 DIAGNOSIS — E1122 Type 2 diabetes mellitus with diabetic chronic kidney disease: Secondary | ICD-10-CM | POA: Diagnosis not present

## 2020-12-08 DIAGNOSIS — M25521 Pain in right elbow: Secondary | ICD-10-CM | POA: Diagnosis not present

## 2020-12-08 DIAGNOSIS — N183 Chronic kidney disease, stage 3 unspecified: Secondary | ICD-10-CM | POA: Diagnosis not present

## 2020-12-08 DIAGNOSIS — K750 Abscess of liver: Secondary | ICD-10-CM | POA: Diagnosis not present

## 2020-12-08 DIAGNOSIS — K8689 Other specified diseases of pancreas: Secondary | ICD-10-CM | POA: Diagnosis not present

## 2020-12-08 DIAGNOSIS — A409 Streptococcal sepsis, unspecified: Secondary | ICD-10-CM | POA: Diagnosis not present

## 2020-12-08 DIAGNOSIS — K810 Acute cholecystitis: Secondary | ICD-10-CM | POA: Diagnosis not present

## 2020-12-10 DIAGNOSIS — A409 Streptococcal sepsis, unspecified: Secondary | ICD-10-CM | POA: Diagnosis not present

## 2020-12-10 DIAGNOSIS — K810 Acute cholecystitis: Secondary | ICD-10-CM | POA: Diagnosis not present

## 2020-12-10 DIAGNOSIS — I1310 Hypertensive heart and chronic kidney disease without heart failure, with stage 1 through stage 4 chronic kidney disease, or unspecified chronic kidney disease: Secondary | ICD-10-CM | POA: Diagnosis not present

## 2020-12-10 DIAGNOSIS — K750 Abscess of liver: Secondary | ICD-10-CM | POA: Diagnosis not present

## 2020-12-10 DIAGNOSIS — M47814 Spondylosis without myelopathy or radiculopathy, thoracic region: Secondary | ICD-10-CM | POA: Diagnosis not present

## 2020-12-10 DIAGNOSIS — E1122 Type 2 diabetes mellitus with diabetic chronic kidney disease: Secondary | ICD-10-CM | POA: Diagnosis not present

## 2020-12-10 DIAGNOSIS — K8689 Other specified diseases of pancreas: Secondary | ICD-10-CM | POA: Diagnosis not present

## 2020-12-10 DIAGNOSIS — N183 Chronic kidney disease, stage 3 unspecified: Secondary | ICD-10-CM | POA: Diagnosis not present

## 2020-12-10 DIAGNOSIS — I2510 Atherosclerotic heart disease of native coronary artery without angina pectoris: Secondary | ICD-10-CM | POA: Diagnosis not present

## 2020-12-15 DIAGNOSIS — K810 Acute cholecystitis: Secondary | ICD-10-CM | POA: Diagnosis not present

## 2020-12-15 DIAGNOSIS — K750 Abscess of liver: Secondary | ICD-10-CM | POA: Diagnosis not present

## 2020-12-15 DIAGNOSIS — N183 Chronic kidney disease, stage 3 unspecified: Secondary | ICD-10-CM | POA: Diagnosis not present

## 2020-12-15 DIAGNOSIS — K8689 Other specified diseases of pancreas: Secondary | ICD-10-CM | POA: Diagnosis not present

## 2020-12-15 DIAGNOSIS — I2510 Atherosclerotic heart disease of native coronary artery without angina pectoris: Secondary | ICD-10-CM | POA: Diagnosis not present

## 2020-12-15 DIAGNOSIS — M25521 Pain in right elbow: Secondary | ICD-10-CM | POA: Diagnosis not present

## 2020-12-15 DIAGNOSIS — I1310 Hypertensive heart and chronic kidney disease without heart failure, with stage 1 through stage 4 chronic kidney disease, or unspecified chronic kidney disease: Secondary | ICD-10-CM | POA: Diagnosis not present

## 2020-12-15 DIAGNOSIS — E1122 Type 2 diabetes mellitus with diabetic chronic kidney disease: Secondary | ICD-10-CM | POA: Diagnosis not present

## 2020-12-15 DIAGNOSIS — M47814 Spondylosis without myelopathy or radiculopathy, thoracic region: Secondary | ICD-10-CM | POA: Diagnosis not present

## 2020-12-15 DIAGNOSIS — A409 Streptococcal sepsis, unspecified: Secondary | ICD-10-CM | POA: Diagnosis not present

## 2020-12-19 ENCOUNTER — Other Ambulatory Visit: Payer: Self-pay | Admitting: Family Medicine

## 2020-12-19 DIAGNOSIS — E1121 Type 2 diabetes mellitus with diabetic nephropathy: Secondary | ICD-10-CM

## 2020-12-22 DIAGNOSIS — N183 Chronic kidney disease, stage 3 unspecified: Secondary | ICD-10-CM | POA: Diagnosis not present

## 2020-12-22 DIAGNOSIS — K750 Abscess of liver: Secondary | ICD-10-CM | POA: Diagnosis not present

## 2020-12-22 DIAGNOSIS — K8689 Other specified diseases of pancreas: Secondary | ICD-10-CM | POA: Diagnosis not present

## 2020-12-22 DIAGNOSIS — I2510 Atherosclerotic heart disease of native coronary artery without angina pectoris: Secondary | ICD-10-CM | POA: Diagnosis not present

## 2020-12-22 DIAGNOSIS — E1122 Type 2 diabetes mellitus with diabetic chronic kidney disease: Secondary | ICD-10-CM | POA: Diagnosis not present

## 2020-12-22 DIAGNOSIS — A409 Streptococcal sepsis, unspecified: Secondary | ICD-10-CM | POA: Diagnosis not present

## 2020-12-22 DIAGNOSIS — K810 Acute cholecystitis: Secondary | ICD-10-CM | POA: Diagnosis not present

## 2020-12-22 DIAGNOSIS — I1310 Hypertensive heart and chronic kidney disease without heart failure, with stage 1 through stage 4 chronic kidney disease, or unspecified chronic kidney disease: Secondary | ICD-10-CM | POA: Diagnosis not present

## 2020-12-22 DIAGNOSIS — M47814 Spondylosis without myelopathy or radiculopathy, thoracic region: Secondary | ICD-10-CM | POA: Diagnosis not present

## 2020-12-24 DIAGNOSIS — K750 Abscess of liver: Secondary | ICD-10-CM | POA: Diagnosis not present

## 2020-12-24 DIAGNOSIS — K810 Acute cholecystitis: Secondary | ICD-10-CM | POA: Diagnosis not present

## 2020-12-24 DIAGNOSIS — M47814 Spondylosis without myelopathy or radiculopathy, thoracic region: Secondary | ICD-10-CM | POA: Diagnosis not present

## 2020-12-24 DIAGNOSIS — N183 Chronic kidney disease, stage 3 unspecified: Secondary | ICD-10-CM | POA: Diagnosis not present

## 2020-12-24 DIAGNOSIS — A409 Streptococcal sepsis, unspecified: Secondary | ICD-10-CM | POA: Diagnosis not present

## 2020-12-24 DIAGNOSIS — I2510 Atherosclerotic heart disease of native coronary artery without angina pectoris: Secondary | ICD-10-CM | POA: Diagnosis not present

## 2020-12-24 DIAGNOSIS — E1122 Type 2 diabetes mellitus with diabetic chronic kidney disease: Secondary | ICD-10-CM | POA: Diagnosis not present

## 2020-12-24 DIAGNOSIS — I1310 Hypertensive heart and chronic kidney disease without heart failure, with stage 1 through stage 4 chronic kidney disease, or unspecified chronic kidney disease: Secondary | ICD-10-CM | POA: Diagnosis not present

## 2020-12-24 DIAGNOSIS — K8689 Other specified diseases of pancreas: Secondary | ICD-10-CM | POA: Diagnosis not present

## 2020-12-25 ENCOUNTER — Other Ambulatory Visit: Payer: Medicare PPO

## 2020-12-26 ENCOUNTER — Telehealth (INDEPENDENT_AMBULATORY_CARE_PROVIDER_SITE_OTHER): Payer: Medicare PPO | Admitting: General Surgery

## 2020-12-26 DIAGNOSIS — K750 Abscess of liver: Secondary | ICD-10-CM

## 2020-12-26 DIAGNOSIS — K810 Acute cholecystitis: Secondary | ICD-10-CM

## 2020-12-26 NOTE — Telephone Encounter (Signed)
[facility]  Repeat IR study and plan for cholecystostomy tube check, drain is putting out brownish yellow fluid about 25cc every other day. Ultimately the patient will get his  gallbladder out. I am fine with the IR drain remaining in place. He is worried about the drain being removed and pain.  Discussed that they will look and see if the cystic duct is open or not. If open they may remove it, but they don't have remove it if patient really wants to avoid the pain of removal. I can remove it at the time of surgery. It it is still obstructed it will stay in place. Patient understands.   Curlene Labrum, MD [facility] [address] [place], [place] [address] [phone] (office)

## 2020-12-29 ENCOUNTER — Other Ambulatory Visit: Payer: Self-pay

## 2020-12-29 ENCOUNTER — Other Ambulatory Visit: Payer: Self-pay | Admitting: Radiology

## 2020-12-29 ENCOUNTER — Ambulatory Visit (HOSPITAL_COMMUNITY)
Admission: RE | Admit: 2020-12-29 | Discharge: 2020-12-29 | Disposition: A | Discharge status: Home / Self Care | Payer: Medicare PPO | Source: Ambulatory Visit | Attending: Radiology | Admitting: Radiology

## 2020-12-29 DIAGNOSIS — K810 Acute cholecystitis: Secondary | ICD-10-CM

## 2020-12-29 DIAGNOSIS — Z434 Encounter for attention to other artificial openings of digestive tract: Secondary | ICD-10-CM | POA: Diagnosis not present

## 2020-12-29 HISTORY — PX: IR EXCHANGE BILIARY DRAIN: IMG6046

## 2020-12-29 HISTORY — PX: IR CATHETER TUBE CHANGE: IMG717

## 2020-12-29 MED ORDER — IOHEXOL 300 MG/ML  SOLN: 100.0000 mL | Freq: Once | INTRAMUSCULAR | Status: DC | PRN

## 2020-12-29 MED ORDER — LIDOCAINE HCL 1 % IJ SOLN
INTRAMUSCULAR | Status: DC | PRN
  Administered 2020-12-29: 20 mL

## 2020-12-29 MED ORDER — LIDOCAINE HCL 1 % IJ SOLN
INTRAMUSCULAR | Status: AC
  Filled 2020-12-29: qty 20

## 2020-12-29 NOTE — Patient Outreach (Signed)
[facility]) Care Management  12/29/2020  James Glover [birth year] [id number]   Telephone Assessment   Successful outreach call placed to patient. Patient pleased to report how well he has been feeling and states he is almost at his baseline. He denies any RN CM needs or concerns at this time.    Medications Reviewed Today     Reviewed by Hayden Pedro, RN (Registered Nurse) on 12/29/20 at [place] [facility] Status: <None>   Medication Order Taking? Sig Documenting Provider Last Dose Status Informant  ACCU-CHEK GUIDE test strip [id number] No USE AS DIRECTED [name], MD Taking Active Spouse/Significant Other  amLODipine (NORVASC) 10 MG tablet [id number] No Take 1 tablet (10 mg total) by mouth daily. Nita Sells, MD Taking Active   aspirin 81 MG tablet [id number] No Take 81 mg by mouth daily. [provider] Taking Active Spouse/Significant Other  famotidine (PEPCID) 40 MG tablet [id number] No Take 40 mg by mouth at bedtime.  Patient not taking: No sig reported   [provider] Not Taking Active   fluconazole (DIFLUCAN) 150 MG tablet [id number] No Take 1 tablet (150 mg total) by mouth once a week. Take 1 tablet by mouth once a week for 2-4 weeks to treat jock itch fungal infection [name], MD Taking Active   furosemide (LASIX) 20 MG tablet [id number] No Take 1 tablet (20 mg total) by mouth daily.  Patient taking differently: Take 20 mg by mouth daily. 20 mg - 40mg  alternating qod   Nita Sells, MD Taking Active   Multiple Vitamins-Minerals (MULTIVITAMIN WITH MINERALS) tablet [id number] No Take 1 tablet by mouth daily. [provider] Taking Active Spouse/Significant Other  Omega-3 Fatty Acids (FISH OIL) 1000 MG CAPS [id number] No Take 1,000 mg by mouth daily. [provider] Taking Active Spouse/Significant Other  omeprazole (PRILOSEC) 40 MG capsule [id number]  Take 1 capsule (40 mg total) by mouth in the  morning and at bedtime. [name], MD  Active   oxybutynin (DITROPAN) 5 MG tablet [id number] No Take 5 mg by mouth daily. [provider] Taking Active Spouse/Significant Other  pioglitazone (ACTOS) 45 MG tablet [id number]  TAKE 1 TABLET(45 MG) BY MOUTH DAILY [name], MD  Active   saxagliptin HCl (ONGLYZA) 2.5 MG TABS tablet [id number] No TAKE 1 TABLET(2.5 MG) BY MOUTH DAILY  Patient taking differently: Take 2.5 mg by mouth daily.   [name], MD Taking Active   simvastatin (ZOCOR) 20 MG tablet [id number] No TAKE 1 TABLET(20 MG) BY MOUTH AT BEDTIME  Patient taking differently: Take 20 mg by mouth at bedtime.   [name], MD Taking Active   tamsulosin [facility]) 0.4 MG CAPS capsule [id number] No Take 1 capsule (0.4 mg total) by mouth 2 (two) times daily after a meal. For urinary urgency or weak stream. Freeman Caldron, PA-C Taking Active Spouse/Significant Other           Med Note Ardine Eng Oct 15, 2020  9:03 PM)    Tyler Aas FLEXTOUCH 100 UNIT/ML FlexTouch Pen [id number]  ADMINISTER 18 UNITS UNDER THE SKIN AT BEDTIME [name], MD  Active              Care Plan : RN Care Manager POC  Updates made by Hayden Pedro, RN since 12/29/2020 12:00 AM     Problem: Chronic Disease Mgmt and Care Coordination Needs Related to Gallbaldder Issues and DM   Priority: High     Long-Range Goal:  Development of POC for Mgmt of DM and Gallbladder Issues   Start Date: 12/29/2020  Expected End Date: 12/29/2021  Priority: High  Note:   Current Barriers:  Knowledge Deficits related to plan of care for management of DMII and Gallbladder Issues Chronic Disease Management support and education needs related to DMII and Gallbladder Issues  RNCM Clinical Goal(s):  Patient will verbalize understanding of plan for management of DMII and gallbladder verbalize basic understanding of  DMII and gallbladder issues, disease process and self health management plan of DM and  gallbladder take all medications exactly as prescribed and will call provider for medication related questions through collaboration with RN Care manager, provider, and care team.   Interventions: POC sent to PCP upon initial assessment, quarterly and with any changes in patient's conditions Inter-disciplinary care team collaboration (see longitudinal plan of care) Evaluation of current treatment plan related to  self management and patient's adherence to plan as established by provider  Gallbladder IssuesNew goal. Evaluation of current treatment plan related to  gallbladder issues and need for surgery , self-management and patient's adherence to plan as established by provider. Discussed plans with patient for ongoing care management follow up and provided patient with direct contact information for care management team Evaluation of current treatment plan related to gallbladder issues and need for surgical intervention and patient's adherence to plan as established by provider; Reviewed scheduled/upcoming provider appointments including; IR appt for eval and possible removal of drain tube this afternoon, upcoming surgical MD appt this week to discuss when surgery will be performed  Diabetes Issues: New Goal .Evaluation of current treatment plan related to DM, self-management and patient's adherence to plan as established by provider. Discussed plans with patient for ongoing care management follow up and provided patient with direct contact information for care management team Evaluation of current treatment plan related to gallbladder issues and need for surgical intervention and patient's adherence to plan as established by provider  Diabetes Interventions: Assessed patient's understanding of A1c goal: <7% Provided education to patient about basic DM disease process; Reviewed medications with patient and discussed importance of medication adherence; Lab Results  Component Value Date    HGBA1C 7.6 (H) 10/23/2020   Patient Goals/Self-Care Activities: Patient will self administer medications as prescribed Patient will attend all scheduled provider appointments Patient will have successful gallbladder removal surgery Patient will adhere to diabetic diet and regimen  Follow Up Plan:  Telephone follow up appointment with care management team member scheduled for:  4-6wks The patient has been provided with contact information for the care management team and has been advised to call with any health related questions or concerns.          Plan:  RN CM discussed with patient next outreach within 4-6wks. Patient agrees to care plan and follow up.  Enzo Montgomery, RN,BSN,CCM [place] Management Telephonic Care Management Coordinator Direct Phone: [phone] Toll Free: [phone] Fax: [phone]

## 2020-12-30 ENCOUNTER — Encounter (HOSPITAL_COMMUNITY): Payer: Self-pay

## 2020-12-30 ENCOUNTER — Other Ambulatory Visit: Payer: Self-pay | Admitting: Radiology

## 2020-12-30 ENCOUNTER — Other Ambulatory Visit: Payer: Self-pay | Admitting: Family Medicine

## 2020-12-30 ENCOUNTER — Ambulatory Visit: Payer: Medicare PPO | Admitting: General Surgery

## 2020-12-30 VITALS — BP 121/72 | HR 83 | Temp 98.6°F | Resp 12 | Ht 76.0 in | Wt 252.0 lb

## 2020-12-30 DIAGNOSIS — K750 Abscess of liver: Secondary | ICD-10-CM

## 2020-12-30 DIAGNOSIS — K810 Acute cholecystitis: Secondary | ICD-10-CM | POA: Diagnosis not present

## 2020-12-30 DIAGNOSIS — E785 Hyperlipidemia, unspecified: Secondary | ICD-10-CM

## 2020-12-30 NOTE — Telephone Encounter (Signed)
Has an appt in january

## 2020-12-30 NOTE — Progress Notes (Signed)
[facility] History and Physical   Chief Complaint   Post-op Follow-up     James Glover is a 75 y.o. male.  HPI: Patient known to me with a prior history of gangrenous cholecystitis and liver abscess s/p drainage of the liver abscess and cholecystostomy tube. He had the liver abscess drain removed and had the cholecystostomy tube interrogated yesterday. The cystic duct is patent but he did have significant pain with flushing. He has been having < 25 cc out of brown fluid prior to his interrogation and now has had none. The drain was left open and in place given the pain he experienced.  He is here to discuss cholecystectomy. He has been doing well and completed his extended course of antibiotics. He has had no signs of infection. He is getting back to his regular state of health.   Past Medical History:  Diagnosis Date   CKD (chronic kidney disease), stage III [facility])    nephrologist-- [place]--- per lov note 03-08-2018 in epic, stable (baseline Cr 1.5 - 2)   Dyslipidemia    Erectile dysfunction    First degree heart block    GERD (gastroesophageal reflux disease)    Hemorrhoids    internal and external   History of nuclear stress test    05-26-2009 (by dr Rollene Fare due to HTN and DM2)--- normal study w/ no ischemia,  normal LV function and wall motion , ef 70%   Hyperplasia of prostate with lower urinary tract symptoms (LUTS)    Hypertension    Hypogonadism male    Iron deficiency    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 Tresa Moore   Prostate cancer [facility]) urologist-  dr t. Tresa Moore  oncologsit-  dr Tammi Klippel   dx 05-30-2018-- Stage T1c, Gleason 4+5, High Risk   Renal cyst, left    followed by dr t. Tresa Moore--  chronic noncomplex   Type 2 diabetes mellitus ([place])    followed by pcp   Wears partial dentures    upper    Past Surgical History:  Procedure Laterality Date   CIRCUMCISION  age 47   CYSTOSCOPY N/A 11/03/2018   Procedure:  CYSTOSCOPY FLEXIBLE;  Surgeon: Alexis Frock, MD;  Location: [facility];  Service: Urology;  Laterality: N/A;  NO SEEDS FOUND IN BLADDER   IR EXCHANGE BILIARY DRAIN  12/29/2020   IR FLUORO GUIDED NEEDLE PLC ASPIRATION/INJECTION LOC  10/24/2020   IR GUIDED DRAIN W CATHETER PLACEMENT  10/24/2020   IR GUIDED DRAIN W CATHETER PLACEMENT  10/24/2020   IR PERC CHOLECYSTOSTOMY  10/24/2020   IR US GUIDE BX ASP/DRAIN  10/24/2020   IR US GUIDE BX ASP/DRAIN  10/24/2020   IR US GUIDE BX ASP/DRAIN  10/24/2020   KNEE ARTHROSCOPY Right 11/14/2017   dr Stann Mainland @[facility]    RADIOACTIVE SEED IMPLANT N/A 11/03/2018   Procedure: RADIOACTIVE SEED IMPLANT/BRACHYTHERAPY IMPLANT;  Surgeon: Alexis Frock, MD;  Location: [facility];  Service: Urology;  Laterality: N/A;       SEEDS IMPLANTED   SPACE OAR INSTILLATION N/A 11/03/2018   Procedure: SPACE OAR INSTILLATION;  Surgeon: Alexis Frock, MD;  Location: [facility];  Service: Urology;  Laterality: N/A;    Family History  Problem Relation Age of Onset   Lymphoma Father    Cancer Father        lymphoma   Hypertension Father    Lymphoma Brother    Cancer Brother  lymphoma   Dementia Mother    Diabetes Mother    Diabetes Sister    Cancer Brother        skin cancer   Diabetes Brother    Lymphoma Paternal Grandfather    Cancer Paternal Grandfather        lymphoma   Diabetes Brother    Kidney disease Brother    COPD Brother    Heart disease Neg Hx     Social History   Tobacco Use   Smoking status: Never   Smokeless tobacco: Never  Vaping Use   Vaping Use: Never used  Substance Use Topics   Alcohol use: Yes    Alcohol/week: 3.0 standard drinks    Types: 3 Glasses of wine per week    Comment: 3 glasses of wine per week   Drug use: Never    Medications: I have reviewed the patient's current medications. Allergies as of 12/30/2020       Reactions   Ace Inhibitors Cough   Statins Cough         Medication List        Accurate as of December 30, 2020  3:57 PM. If you have any questions, ask your nurse or doctor.          STOP taking these medications    famotidine 40 MG tablet Commonly known as: PEPCID Stopped by: Virl Cagey, MD   fluconazole 150 MG tablet Commonly known as: DIFLUCAN Stopped by: Virl Cagey, MD       TAKE these medications    Accu-Chek Guide test strip Generic drug: glucose blood USE AS DIRECTED   amLODipine 10 MG tablet Commonly known as: NORVASC Take 1 tablet (10 mg total) by mouth daily.   aspirin 81 MG tablet Take 81 mg by mouth daily.   Fish Oil 1000 MG Caps Take 1,000 mg by mouth daily.   furosemide 20 MG tablet Commonly known as: LASIX Take 1 tablet (20 mg total) by mouth daily. What changed: additional instructions   multivitamin with minerals tablet Take 1 tablet by mouth daily.   omeprazole 40 MG capsule Commonly known as: PRILOSEC Take 1 capsule (40 mg total) by mouth in the morning and at bedtime.   Onglyza 2.5 MG Tabs tablet Generic drug: saxagliptin HCl TAKE 1 TABLET(2.5 MG) BY MOUTH DAILY What changed:  how much to take how to take this when to take this additional instructions   oxybutynin 5 MG tablet Commonly known as: DITROPAN Take 5 mg by mouth daily.   pioglitazone 45 MG tablet Commonly known as: ACTOS TAKE 1 TABLET(45 MG) BY MOUTH DAILY   simvastatin 20 MG tablet Commonly known as: ZOCOR TAKE 1 TABLET(20 MG) BY MOUTH AT BEDTIME What changed: See the new instructions.   tamsulosin 0.4 MG Caps capsule Commonly known as: FLOMAX Take 1 capsule (0.4 mg total) by mouth 2 (two) times daily after a meal. For urinary urgency or weak stream.   Tyler Aas FlexTouch 100 UNIT/ML FlexTouch Pen Generic drug: insulin degludec ADMINISTER 18 UNITS UNDER THE SKIN AT BEDTIME         ROS:  A comprehensive review of systems was negative except for: Gastrointestinal: positive for abdominal pain  and cholecystostomy tube in place, brownish drainage decreasing  Blood pressure 121/72, pulse 83, temperature 98.6 F (37 C), temperature source Other (Comment), resp. rate 12, height 6\' 4"  (1.93 m), weight 252 lb (114.3 kg), SpO2 96 %. Physical Exam Vitals reviewed.  Constitutional:  Appearance: Normal appearance.  HENT:     Head: Normocephalic.     Nose: Nose normal.  Eyes:     Extraocular Movements: Extraocular movements intact.  Cardiovascular:     Rate and Rhythm: Normal rate and regular rhythm.  Pulmonary:     Effort: Pulmonary effort is normal.     Breath sounds: Normal breath sounds.  Abdominal:     General: There is no distension.     Palpations: Abdomen is soft.     Tenderness: There is abdominal tenderness.     Comments: Tender RUQ around drain  Musculoskeletal:     Cervical back: Normal range of motion.  Skin:    General: Skin is warm.  Neurological:     General: No focal deficit present.     Mental Status: He is alert and oriented to person, place, and time.  Psychiatric:        Mood and Affect: Mood normal.        Behavior: Behavior normal.    Results: None    Assessment & Plan:  KAIS MONJE is a 75 y.o. male with a history of liver abscess and gangrenous cholecystitis. Doing well and has his cholecystostomy tube in place still. He is ready to get his gallbladder out.   PLAN: I counseled the patient about the indication, risks and benefits of laparoscopic cholecystectomy.  He understands there is a very small chance for bleeding, infection, injury to normal structures (including common bile duct), conversion to open surgery, persistent symptoms, evolution of postcholecystectomy diarrhea, need for secondary interventions, anesthesia reaction, cardiopulmonary issues and other risks not specifically detailed here. I described the expected recovery, the plan for follow-up and the restrictions during the recovery phase.  All questions were  answered.  Discussed removing cholecystostomy tube at time of surgery and risk of needing open procedure given scar or needing subtotal cholecystectomy due to the scarring.    All questions were answered to the satisfaction of the patient and family.     Virl Cagey 12/30/2020, 3:57 PM

## 2020-12-30 NOTE — Patient Instructions (Addendum)
Minimally Invasive Cholecystectomy Minimally invasive cholecystectomy is surgery to remove the gallbladder. The gallbladder is a pear-shaped organ that lies beneath the liver on the right side of the body. The gallbladder stores bile, which is a fluid that helps the body digest fats. Cholecystectomy is often done to treat inflammation of the gallbladder (cholecystitis). This condition is usually caused by a buildup of gallstones (cholelithiasis) in the gallbladder. Gallstones can block the flow of bile, which can result in inflammation and pain. In severe cases, emergency surgery may be required. This procedure is done though small incisions in the abdomen, instead of one large incision. It is also called laparoscopic surgery. A thin scope with a camera (laparoscope) is inserted through one incision. Then surgical instruments are inserted through the other incisions. In some cases, a minimally invasive surgery may need to be changed to a surgery that is done through a larger incision. This is called open surgery. Tell a health care provider about: Any allergies you have. All medicines you are taking, including vitamins, herbs, eye drops, creams, and over-the-counter medicines. Any problems you or family members have had with anesthetic medicines. Any blood disorders you have. Any surgeries you have had. Any medical conditions you have. Whether you are pregnant or may be pregnant. What are the risks? Generally, this is a safe procedure. However, problems may occur, including: Infection. Bleeding. Allergic reactions to medicines. Damage to nearby structures or organs. A stone remaining in the common bile duct. The common bile duct carries bile from the gallbladder into the small intestine. A bile leak from the cyst duct that is clipped when your gallbladder is removed. What happens before the procedure? Medicines   Ask your health care provider about: Changing or stopping your regular  medicines. This is especially important if you are taking diabetes medicines or blood thinners. Taking medicines such as aspirin and ibuprofen. These medicines can thin your blood. Do not take these medicines unless your health care provider tells you to take them. Taking over-the-counter medicines, vitamins, herbs, and supplements. General instructions Let your health care provider know if you develop a cold or an infection before surgery. Plan to have someone take you home from the hospital or clinic. If you will be going home right after the procedure, plan to have someone with you for 24 hours. Ask your health care provider: How your surgery site will be marked. What steps will be taken to help prevent infection. These may include: Removing hair at the surgery site. Washing skin with a germ-killing soap. Taking antibiotic medicine. What happens during the procedure?  An IV will be inserted into one of your veins. You will be given one or both of the following: A medicine to help you relax (sedative). A medicine to make you fall asleep (general anesthetic). A breathing tube will be placed in your mouth. Your surgeon will make several small incisions in your abdomen. The laparoscope will be inserted through one of the small incisions. The camera on the laparoscope will send images to a monitor in the operating room. This lets your surgeon see inside your abdomen. A gas will be pumped into your abdomen. This will expand your abdomen to give the surgeon more room to perform the surgery. Other tools that are needed for the procedure will be inserted through the other incisions. The gallbladder will be removed through one of the incisions. Your common bile duct may be examined. If stones are found in the common bile duct, they may be  removed. After your gallbladder has been removed, the incisions will be closed with stitches (sutures), staples, or skin glue. Your incisions may be covered with  a bandage (dressing). The procedure may vary among health care providers and hospitals. What happens after the procedure? Your blood pressure, heart rate, breathing rate, and blood oxygen level will be monitored until you leave the hospital or clinic. You will be given medicines as needed to control your pain. If you were given a sedative during the procedure, it can affect you for several hours. Do not drive or operate machinery until your health care provider says that it is safe. Summary Minimally invasive cholecystectomy, also called laparoscopic cholecystectomy, is surgery to remove the gallbladder using small incisions. Tell your health care provider about all the medical conditions you have and all the medicines you are taking for those conditions. Before the procedure, follow instructions about eating or drinking restrictions and changing or stopping medicines. If you were given a sedative during the procedure, it can affect you for several hours. Do not drive or operate machinery until your health care provider says that it is safe. This information is not intended to replace advice given to you by your health care provider. Make sure you discuss any questions you have with your health care provider. Document Revised: 11/20/2018 Document Reviewed: 11/20/2018 Elsevier Patient Education  [place].

## 2020-12-30 NOTE — H&P (Signed)
[facility] History and Physical   Chief Complaint   Post-op Follow-up     James Glover is a 75 y.o. male.  HPI: Patient known to me with a prior history of gangrenous cholecystitis and liver abscess s/p drainage of the liver abscess and cholecystostomy tube. He had the liver abscess drain removed and had the cholecystostomy tube interrogated yesterday. The cystic duct is patent but he did have significant pain with flushing. He has been having < 25 cc out of brown fluid prior to his interrogation and now has had none. The drain was left open and in place given the pain he experienced.  He is here to discuss cholecystectomy. He has been doing well and completed his extended course of antibiotics. He has had no signs of infection. He is getting back to his regular state of health.   Past Medical History:  Diagnosis Date   CKD (chronic kidney disease), stage III [facility])    nephrologist-- [place]--- per lov note 03-08-2018 in epic, stable (baseline Cr 1.5 - 2)   Dyslipidemia    Erectile dysfunction    First degree heart block    GERD (gastroesophageal reflux disease)    Hemorrhoids    internal and external   History of nuclear stress test    05-26-2009 (by dr Rollene Fare due to HTN and DM2)--- normal study w/ no ischemia,  normal LV function and wall motion , ef 70%   Hyperplasia of prostate with lower urinary tract symptoms (LUTS)    Hypertension    Hypogonadism male    Iron deficiency    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 Tresa Moore   Prostate cancer [facility]) urologist-  dr t. Tresa Moore  oncologsit-  dr Tammi Klippel   dx 05-30-2018-- Stage T1c, Gleason 4+5, High Risk   Renal cyst, left    followed by dr t. Tresa Moore--  chronic noncomplex   Type 2 diabetes mellitus ([place])    followed by pcp   Wears partial dentures    upper    Past Surgical History:  Procedure Laterality Date   CIRCUMCISION  age 64   CYSTOSCOPY N/A 11/03/2018   Procedure:  CYSTOSCOPY FLEXIBLE;  Surgeon: Alexis Frock, MD;  Location: [facility];  Service: Urology;  Laterality: N/A;  NO SEEDS FOUND IN BLADDER   IR EXCHANGE BILIARY DRAIN  12/29/2020   IR FLUORO GUIDED NEEDLE PLC ASPIRATION/INJECTION LOC  10/24/2020   IR GUIDED DRAIN W CATHETER PLACEMENT  10/24/2020   IR GUIDED DRAIN W CATHETER PLACEMENT  10/24/2020   IR PERC CHOLECYSTOSTOMY  10/24/2020   IR US GUIDE BX ASP/DRAIN  10/24/2020   IR US GUIDE BX ASP/DRAIN  10/24/2020   IR US GUIDE BX ASP/DRAIN  10/24/2020   KNEE ARTHROSCOPY Right 11/14/2017   dr Stann Mainland @[facility]    RADIOACTIVE SEED IMPLANT N/A 11/03/2018   Procedure: RADIOACTIVE SEED IMPLANT/BRACHYTHERAPY IMPLANT;  Surgeon: Alexis Frock, MD;  Location: [facility];  Service: Urology;  Laterality: N/A;       SEEDS IMPLANTED   SPACE OAR INSTILLATION N/A 11/03/2018   Procedure: SPACE OAR INSTILLATION;  Surgeon: Alexis Frock, MD;  Location: [facility];  Service: Urology;  Laterality: N/A;    Family History  Problem Relation Age of Onset   Lymphoma Father    Cancer Father        lymphoma   Hypertension Father    Lymphoma Brother    Cancer Brother  lymphoma   Dementia Mother    Diabetes Mother    Diabetes Sister    Cancer Brother        skin cancer   Diabetes Brother    Lymphoma Paternal Grandfather    Cancer Paternal Grandfather        lymphoma   Diabetes Brother    Kidney disease Brother    COPD Brother    Heart disease Neg Hx     Social History   Tobacco Use   Smoking status: Never   Smokeless tobacco: Never  Vaping Use   Vaping Use: Never used  Substance Use Topics   Alcohol use: Yes    Alcohol/week: 3.0 standard drinks    Types: 3 Glasses of wine per week    Comment: 3 glasses of wine per week   Drug use: Never    Medications: I have reviewed the patient's current medications. Allergies as of 12/30/2020       Reactions   Ace Inhibitors Cough   Statins Cough         Medication List        Accurate as of December 30, 2020  3:57 PM. If you have any questions, ask your nurse or doctor.          STOP taking these medications    famotidine 40 MG tablet Commonly known as: PEPCID Stopped by: Virl Cagey, MD   fluconazole 150 MG tablet Commonly known as: DIFLUCAN Stopped by: Virl Cagey, MD       TAKE these medications    Accu-Chek Guide test strip Generic drug: glucose blood USE AS DIRECTED   amLODipine 10 MG tablet Commonly known as: NORVASC Take 1 tablet (10 mg total) by mouth daily.   aspirin 81 MG tablet Take 81 mg by mouth daily.   Fish Oil 1000 MG Caps Take 1,000 mg by mouth daily.   furosemide 20 MG tablet Commonly known as: LASIX Take 1 tablet (20 mg total) by mouth daily. What changed: additional instructions   multivitamin with minerals tablet Take 1 tablet by mouth daily.   omeprazole 40 MG capsule Commonly known as: PRILOSEC Take 1 capsule (40 mg total) by mouth in the morning and at bedtime.   Onglyza 2.5 MG Tabs tablet Generic drug: saxagliptin HCl TAKE 1 TABLET(2.5 MG) BY MOUTH DAILY What changed:  how much to take how to take this when to take this additional instructions   oxybutynin 5 MG tablet Commonly known as: DITROPAN Take 5 mg by mouth daily.   pioglitazone 45 MG tablet Commonly known as: ACTOS TAKE 1 TABLET(45 MG) BY MOUTH DAILY   simvastatin 20 MG tablet Commonly known as: ZOCOR TAKE 1 TABLET(20 MG) BY MOUTH AT BEDTIME What changed: See the new instructions.   tamsulosin 0.4 MG Caps capsule Commonly known as: FLOMAX Take 1 capsule (0.4 mg total) by mouth 2 (two) times daily after a meal. For urinary urgency or weak stream.   Tyler Aas FlexTouch 100 UNIT/ML FlexTouch Pen Generic drug: insulin degludec ADMINISTER 18 UNITS UNDER THE SKIN AT BEDTIME         ROS:  A comprehensive review of systems was negative except for: Gastrointestinal: positive for abdominal pain  and cholecystostomy tube in place, brownish drainage decreasing  Blood pressure 121/72, pulse 83, temperature 98.6 F (37 C), temperature source Other (Comment), resp. rate 12, height 6\' 4"  (1.93 m), weight 252 lb (114.3 kg), SpO2 96 %. Physical Exam Vitals reviewed.  Constitutional:  Appearance: Normal appearance.  HENT:     Head: Normocephalic.     Nose: Nose normal.  Eyes:     Extraocular Movements: Extraocular movements intact.  Cardiovascular:     Rate and Rhythm: Normal rate and regular rhythm.  Pulmonary:     Effort: Pulmonary effort is normal.     Breath sounds: Normal breath sounds.  Abdominal:     General: There is no distension.     Palpations: Abdomen is soft.     Tenderness: There is abdominal tenderness.     Comments: Tender RUQ around drain  Musculoskeletal:     Cervical back: Normal range of motion.  Skin:    General: Skin is warm.  Neurological:     General: No focal deficit present.     Mental Status: He is alert and oriented to person, place, and time.  Psychiatric:        Mood and Affect: Mood normal.        Behavior: Behavior normal.    Results: None    Assessment & Plan:  James Glover is a 75 y.o. male with a history of liver abscess and gangrenous cholecystitis. Doing well and has his cholecystostomy tube in place still. He is ready to get his gallbladder out.   PLAN: I counseled the patient about the indication, risks and benefits of laparoscopic cholecystectomy.  He understands there is a very small chance for bleeding, infection, injury to normal structures (including common bile duct), conversion to open surgery, persistent symptoms, evolution of postcholecystectomy diarrhea, need for secondary interventions, anesthesia reaction, cardiopulmonary issues and other risks not specifically detailed here. I described the expected recovery, the plan for follow-up and the restrictions during the recovery phase.  All questions were  answered.  Discussed removing cholecystostomy tube at time of surgery and risk of needing open procedure given scar or needing subtotal cholecystectomy due to the scarring.    All questions were answered to the satisfaction of the patient and family.     Virl Cagey 12/30/2020, 3:57 PM

## 2020-12-31 DIAGNOSIS — I1310 Hypertensive heart and chronic kidney disease without heart failure, with stage 1 through stage 4 chronic kidney disease, or unspecified chronic kidney disease: Secondary | ICD-10-CM | POA: Diagnosis not present

## 2020-12-31 DIAGNOSIS — I2510 Atherosclerotic heart disease of native coronary artery without angina pectoris: Secondary | ICD-10-CM | POA: Diagnosis not present

## 2020-12-31 DIAGNOSIS — A409 Streptococcal sepsis, unspecified: Secondary | ICD-10-CM | POA: Diagnosis not present

## 2020-12-31 DIAGNOSIS — K750 Abscess of liver: Secondary | ICD-10-CM | POA: Diagnosis not present

## 2020-12-31 DIAGNOSIS — E1122 Type 2 diabetes mellitus with diabetic chronic kidney disease: Secondary | ICD-10-CM | POA: Diagnosis not present

## 2020-12-31 DIAGNOSIS — M47814 Spondylosis without myelopathy or radiculopathy, thoracic region: Secondary | ICD-10-CM | POA: Diagnosis not present

## 2020-12-31 DIAGNOSIS — K810 Acute cholecystitis: Secondary | ICD-10-CM | POA: Diagnosis not present

## 2020-12-31 DIAGNOSIS — K8689 Other specified diseases of pancreas: Secondary | ICD-10-CM | POA: Diagnosis not present

## 2020-12-31 DIAGNOSIS — N183 Chronic kidney disease, stage 3 unspecified: Secondary | ICD-10-CM | POA: Diagnosis not present

## 2021-01-08 NOTE — Patient Instructions (Signed)
Your procedure is scheduled on: 01/14/2021  Report to [facility] at    9:00 AM.  Call this number if you have problems the morning of surgery: [phone]   Remember:   Do not Eat or Drink after midnight         No Smoking the morning of surgery  :  Take these medicines the morning of surgery with A SIP OF WATER: Amlodipine and Omeprazole  No diabetic medication am of surgery  Take only 1/2 dose(9 units)  of Tresiba evening prior to procedure   Do not wear jewelry, make-up or nail polish.  Do not wear lotions, powders, or perfumes. You may wear deodorant.  Do not shave 48 hours prior to surgery. Men may shave face and neck.  Do not bring valuables to the hospital.  Contacts, dentures or bridgework may not be worn into surgery.  Leave suitcase in the car. After surgery it may be brought to your room.  For patients admitted to the hospital, checkout time is 11:00 AM the day of discharge.   Patients discharged the day of surgery will not be allowed to drive home.    Special Instructions: Shower using CHG night before surgery and shower the day of surgery use CHG.  Use special wash - you have one bottle of CHG for all showers.  You should use approximately 1/2 of the bottle for each shower.  How to Use Chlorhexidine for Bathing Chlorhexidine gluconate (CHG) is a germ-killing (antiseptic) solution that is used to clean the skin. It can get rid of the bacteria that normally live on the skin and can keep them away for about 24 hours. To clean your skin with CHG, you may be given: A CHG solution to use in the shower or as part of a sponge bath. A prepackaged cloth that contains CHG. Cleaning your skin with CHG may help lower the risk for infection: While you are staying in the intensive care unit of the hospital. If you have a vascular access, such as a central line, to provide short-term or long-term access to your veins. If you have a catheter to drain urine from your  bladder. If you are on a ventilator. A ventilator is a machine that helps you breathe by moving air in and out of your lungs. After surgery. What are the risks? Risks of using CHG include: A skin reaction. Hearing loss, if CHG gets in your ears and you have a perforated eardrum. Eye injury, if CHG gets in your eyes and is not rinsed out. The CHG product catching fire. Make sure that you avoid smoking and flames after applying CHG to your skin. Do not use CHG: If you have a chlorhexidine allergy or have previously reacted to chlorhexidine. On babies younger than 24 months of age. How to use CHG solution Use CHG only as told by your health care provider, and follow the instructions on the label. Use the full amount of CHG as directed. Usually, this is one bottle. During a shower Follow these steps when using CHG solution during a shower (unless your health care provider gives you different instructions): Start the shower. Use your normal soap and shampoo to wash your face and hair. Turn off the shower or move out of the shower stream. Pour the CHG onto a clean washcloth. Do not use any type of brush or rough-edged sponge. Starting at your neck, lather your body down to your toes. Make sure you follow these instructions:  If you will be having surgery, pay special attention to the part of your body where you will be having surgery. Scrub this area for at least 1 minute. Do not use CHG on your head or face. If the solution gets into your ears or eyes, rinse them well with water. Avoid your genital area. Avoid any areas of skin that have broken skin, cuts, or scrapes. Scrub your back and under your arms. Make sure to wash skin folds. Let the lather sit on your skin for 1-2 minutes or as long as told by your health care provider. Thoroughly rinse your entire body in the shower. Make sure that all body creases and crevices are rinsed well. Dry off with a clean towel. Do not put any substances on  your body afterward--such as powder, lotion, or perfume--unless you are told to do so by your health care provider. Only use lotions that are recommended by the manufacturer. Put on clean clothes or pajamas. If it is the night before your surgery, sleep in clean sheets.  During a sponge bath Follow these steps when using CHG solution during a sponge bath (unless your health care provider gives you different instructions): Use your normal soap and shampoo to wash your face and hair. Pour the CHG onto a clean washcloth. Starting at your neck, lather your body down to your toes. Make sure you follow these instructions: If you will be having surgery, pay special attention to the part of your body where you will be having surgery. Scrub this area for at least 1 minute. Do not use CHG on your head or face. If the solution gets into your ears or eyes, rinse them well with water. Avoid your genital area. Avoid any areas of skin that have broken skin, cuts, or scrapes. Scrub your back and under your arms. Make sure to wash skin folds. Let the lather sit on your skin for 1-2 minutes or as long as told by your health care provider. Using a different clean, wet washcloth, thoroughly rinse your entire body. Make sure that all body creases and crevices are rinsed well. Dry off with a clean towel. Do not put any substances on your body afterward--such as powder, lotion, or perfume--unless you are told to do so by your health care provider. Only use lotions that are recommended by the manufacturer. Put on clean clothes or pajamas. If it is the night before your surgery, sleep in clean sheets. How to use CHG prepackaged cloths Only use CHG cloths as told by your health care provider, and follow the instructions on the label. Use the CHG cloth on clean, dry skin. Do not use the CHG cloth on your head or face unless your health care provider tells you to. When washing with the CHG cloth: Avoid your genital  area. Avoid any areas of skin that have broken skin, cuts, or scrapes. Before surgery Follow these steps when using a CHG cloth to clean before surgery (unless your health care provider gives you different instructions): Using the CHG cloth, vigorously scrub the part of your body where you will be having surgery. Scrub using a back-and-forth motion for 3 minutes. The area on your body should be completely wet with CHG when you are done scrubbing. Do not rinse. Discard the cloth and let the area air-dry. Do not put any substances on the area afterward, such as powder, lotion, or perfume. Put on clean clothes or pajamas. If it is the night before your surgery, sleep  in clean sheets.  For general bathing Follow these steps when using CHG cloths for general bathing (unless your health care provider gives you different instructions). Use a separate CHG cloth for each area of your body. Make sure you wash between any folds of skin and between your fingers and toes. Wash your body in the following order, switching to a new cloth after each step: The front of your neck, shoulders, and chest. Both of your arms, under your arms, and your hands. Your stomach and groin area, avoiding the genitals. Your right leg and foot. Your left leg and foot. The back of your neck, your back, and your buttocks. Do not rinse. Discard the cloth and let the area air-dry. Do not put any substances on your body afterward--such as powder, lotion, or perfume--unless you are told to do so by your health care provider. Only use lotions that are recommended by the manufacturer. Put on clean clothes or pajamas. Contact a health care provider if: Your skin gets irritated after scrubbing. You have questions about using your solution or cloth. You swallow any chlorhexidine. Call your local poison control center (1-(843)055-8182 in the [place] Get help right away if: Your eyes itch badly, or they become very red or swollen. Your  skin itches badly and is red or swollen. Your hearing changes. You have trouble seeing. You have swelling or tingling in your mouth or throat. You have trouble breathing. These symptoms may represent a serious problem that is an emergency. Do not wait to see if the symptoms will go away. Get medical help right away. Call your local emergency services (911 in the [place] Do not drive yourself to the hospital. Summary Chlorhexidine gluconate (CHG) is a germ-killing (antiseptic) solution that is used to clean the skin. Cleaning your skin with CHG may help to lower your risk for infection. You may be given CHG to use for bathing. It may be in a bottle or in a prepackaged cloth to use on your skin. Carefully follow your health care provider's instructions and the instructions on the product label. Do not use CHG if you have a chlorhexidine allergy. Contact your health care provider if your skin gets irritated after scrubbing. This information is not intended to replace advice given to you by your health care provider. Make sure you discuss any questions you have with your health care provider. Document Revised: 04/28/2020 Document Reviewed: 04/28/2020 Elsevier Patient Education  2022 [place]. Minimally Invasive Cholecystectomy, Care After The following information offers guidance on how to care for yourself after your procedure. Your health care provider may also give you more specific instructions. If you have problems or questions, contact your health care provider. What can I expect after the procedure? After the procedure, it is common to have: Pain at your incision sites. You will be given medicines to control this pain. Mild nausea or vomiting. Bloating and possible shoulder pain from the gas that was used during the procedure. Follow these instructions at home: Medicines Take over-the-counter and prescription medicines only as told by your health care provider. If you were prescribed  an antibiotic medicine, take it as told by your health care provider. Do not stop using the antibiotic even if you start to feel better. Ask your health care provider if the medicine prescribed to you: Requires you to avoid driving or using machinery. Can cause constipation. You may need to take these actions to prevent or treat constipation: Drink enough fluid to keep your urine pale  yellow. Take over-the-counter or prescription medicines. Eat foods that are high in fiber, such as beans, whole grains, and fresh fruits and vegetables. Limit foods that are high in fat and processed sugars, such as fried or sweet foods. Incision care  Follow instructions from your health care provider about how to take care of your incisions. Make sure you: Wash your hands with soap and water for at least 20 seconds before and after you change your bandage (dressing). If soap and water are not available, use hand sanitizer. Change your dressing as told by your health care provider. Leave stitches (sutures), skin glue, or adhesive strips in place. These skin closures may need to be in place for 2 weeks or longer. If adhesive strip edges start to loosen and curl up, you may trim the loose edges. Do not remove adhesive strips completely unless your health care provider tells you to do that. Do not take baths, swim, or use a hot tub until your health care provider approves. Ask your health care provider if you may take showers. You may only be allowed to take sponge baths. Check your incision area every day for signs of infection. Check for: More redness, swelling, or pain. Fluid or blood. Warmth. Pus or a bad smell. Activity Rest as told by your health care provider. Do not do activities that require a lot of effort. Avoid sitting for a long time without moving. Get up to take short walks every 1-2 hours. This is important to improve blood flow and breathing. Ask for help if you feel weak or unsteady. Do not lift  anything that is heavier than 10 lb (4.5 kg), or the limit that you are told, until your health care provider says that it is safe. Do not play contact sports until your health care provider approves. Do not return to work or school until your health care provider approves. Return to your normal activities as told by your health care provider. Ask your health care provider what activities are safe for you. General instructions If you were given a sedative during the procedure, it can affect you for several hours. Do not drive or operate machinery until your health care provider says that it is safe. Keep all follow-up visits. This is important. Contact a health care provider if: You develop a rash. You have more redness, swelling, or pain around your incisions. You have fluid or blood coming from your incisions. Your incisions feel warm to the touch. You have pus or a bad smell coming from your incisions. You have a fever. One or more of your incisions breaks open. Get help right away if: You have trouble breathing. You have chest pain. You have more pain in your shoulders. You faint or feel dizzy when you stand. You have severe pain in your abdomen. You have nausea or vomiting that lasts for more than one day. You have leg pain that is new or unusual, or if it is localized to one specific spot. These symptoms may represent a serious problem that is an emergency. Do not wait to see if the symptoms will go away. Get medical help right away. Call your local emergency services (911 in the [place] Do not drive yourself to the hospital. Summary After your procedure, it is common to have pain at the incision sites. You may also have nausea or bloating. Follow your health care provider's instructions about medicine, activity restrictions, and caring for your incision areas. Do not do activities that require a  lot of effort. Contact a health care provider if you have a fever or other signs of  infection, such as more redness, swelling, or pain around the incisions. Get help right away if you have chest pain, increasing pain in the shoulders, or trouble breathing. This information is not intended to replace advice given to you by your health care provider. Make sure you discuss any questions you have with your health care provider. Document Revised: 08/19/2020 Document Reviewed: 08/19/2020 Elsevier Patient Education  [place] Anesthesia, Adult, Care After This sheet gives you information about how to care for yourself after your procedure. Your health care provider may also give you more specific instructions. If you have problems or questions, contact your health care provider. What can I expect after the procedure? After the procedure, the following side effects are common: Pain or discomfort at the IV site. Nausea. Vomiting. Sore throat. Trouble concentrating. Feeling cold or chills. Feeling weak or tired. Sleepiness and fatigue. Soreness and body aches. These side effects can affect parts of the body that were not involved in surgery. Follow these instructions at home: For the time period you were told by your health care provider:  Rest. Do not participate in activities where you could fall or become injured. Do not drive or use machinery. Do not drink alcohol. Do not take sleeping pills or medicines that cause drowsiness. Do not make important decisions or sign legal documents. Do not take care of children on your own. Eating and drinking Follow any instructions from your health care provider about eating or drinking restrictions. When you feel hungry, start by eating small amounts of foods that are soft and easy to digest (bland), such as toast. Gradually return to your regular diet. Drink enough fluid to keep your urine pale yellow. If you vomit, rehydrate by drinking water, juice, or clear broth. General instructions If you have sleep apnea,  surgery and certain medicines can increase your risk for breathing problems. Follow instructions from your health care provider about wearing your sleep device: Anytime you are sleeping, including during daytime naps. While taking prescription pain medicines, sleeping medicines, or medicines that make you drowsy. Have a responsible adult stay with you for the time you are told. It is important to have someone help care for you until you are awake and alert. Return to your normal activities as told by your health care provider. Ask your health care provider what activities are safe for you. Take over-the-counter and prescription medicines only as told by your health care provider. If you smoke, do not smoke without supervision. Keep all follow-up visits as told by your health care provider. This is important. Contact a health care provider if: You have nausea or vomiting that does not get better with medicine. You cannot eat or drink without vomiting. You have pain that does not get better with medicine. You are unable to pass urine. You develop a skin rash. You have a fever. You have redness around your IV site that gets worse. Get help right away if: You have difficulty breathing. You have chest pain. You have blood in your urine or stool, or you vomit blood. Summary After the procedure, it is common to have a sore throat or nausea. It is also common to feel tired. Have a responsible adult stay with you for the time you are told. It is important to have someone help care for you until you are awake and alert. When you feel hungry, start  by eating small amounts of foods that are soft and easy to digest (bland), such as toast. Gradually return to your regular diet. Drink enough fluid to keep your urine pale yellow. Return to your normal activities as told by your health care provider. Ask your health care provider what activities are safe for you. This information is not intended to replace  advice given to you by your health care provider. Make sure you discuss any questions you have with your health care provider. Document Revised: 11/01/2019 Document Reviewed: 05/31/2019 Elsevier Patient Education  2022 [employer].

## 2021-01-12 ENCOUNTER — Encounter (HOSPITAL_COMMUNITY): Payer: Self-pay

## 2021-01-12 ENCOUNTER — Encounter (HOSPITAL_COMMUNITY)
Admission: RE | Admit: 2021-01-12 | Discharge: 2021-01-12 | Disposition: A | Discharge status: Home / Self Care | Payer: Medicare PPO | Source: Ambulatory Visit | Attending: General Surgery | Admitting: General Surgery

## 2021-01-12 VITALS — BP 137/60 | HR 64 | Temp 98.5°F | Resp 16 | Ht 76.0 in | Wt 250.0 lb

## 2021-01-12 DIAGNOSIS — Z794 Long term (current) use of insulin: Secondary | ICD-10-CM | POA: Diagnosis not present

## 2021-01-12 DIAGNOSIS — E1122 Type 2 diabetes mellitus with diabetic chronic kidney disease: Secondary | ICD-10-CM | POA: Diagnosis not present

## 2021-01-12 DIAGNOSIS — Z01818 Encounter for other preprocedural examination: Secondary | ICD-10-CM | POA: Insufficient documentation

## 2021-01-12 DIAGNOSIS — D638 Anemia in other chronic diseases classified elsewhere: Secondary | ICD-10-CM | POA: Insufficient documentation

## 2021-01-12 DIAGNOSIS — N183 Chronic kidney disease, stage 3 unspecified: Secondary | ICD-10-CM | POA: Diagnosis not present

## 2021-01-12 LAB — CBC WITH DIFFERENTIAL/PLATELET
Abs Immature Granulocytes: 0.04 10*3/uL (ref 0.00–0.07)
Basophils Absolute: 0.1 10*3/uL (ref 0.0–0.1)
Basophils Relative: 1 %
Eosinophils Absolute: 0.2 10*3/uL (ref 0.0–0.5)
Eosinophils Relative: 2 %
HCT: 32.7 % — ABNORMAL LOW (ref 39.0–52.0)
Hemoglobin: 10 g/dL — ABNORMAL LOW (ref 13.0–17.0)
Immature Granulocytes: 1 %
Lymphocytes Relative: 10 %
Lymphs Abs: 0.8 10*3/uL (ref 0.7–4.0)
MCH: 27.9 pg (ref 26.0–34.0)
MCHC: 30.6 g/dL (ref 30.0–36.0)
MCV: 91.3 fL (ref 80.0–100.0)
Monocytes Absolute: 0.6 10*3/uL (ref 0.1–1.0)
Monocytes Relative: 8 %
Neutro Abs: 5.9 10*3/uL (ref 1.7–7.7)
Neutrophils Relative %: 78 %
Platelets: 263 10*3/uL (ref 150–400)
RBC: 3.58 MIL/uL — ABNORMAL LOW (ref 4.22–5.81)
RDW: 17.2 % — ABNORMAL HIGH (ref 11.5–15.5)
WBC: 7.5 10*3/uL (ref 4.0–10.5)
nRBC: 0 % (ref 0.0–0.2)

## 2021-01-12 LAB — BASIC METABOLIC PANEL
Anion gap: 9 (ref 5–15)
BUN: 30 mg/dL — ABNORMAL HIGH (ref 8–23)
CO2: 26 mmol/L (ref 22–32)
Calcium: 8.9 mg/dL (ref 8.9–10.3)
Chloride: 101 mmol/L (ref 98–111)
Creatinine, Ser: 1.62 mg/dL — ABNORMAL HIGH (ref 0.61–1.24)
GFR, Estimated: 44 mL/min — ABNORMAL LOW (ref >60)
Glucose, Bld: 136 mg/dL — ABNORMAL HIGH (ref 70–99)
Potassium: 4 mmol/L (ref 3.5–5.1)
Sodium: 136 mmol/L (ref 135–145)

## 2021-01-12 LAB — HEMOGLOBIN A1C
Hgb A1c MFr Bld: 6.2 % — ABNORMAL HIGH (ref 4.8–5.6)
Mean Plasma Glucose: 131.24 mg/dL

## 2021-01-14 ENCOUNTER — Ambulatory Visit (HOSPITAL_COMMUNITY): Payer: Medicare PPO | Admitting: Anesthesiology

## 2021-01-14 ENCOUNTER — Encounter (HOSPITAL_COMMUNITY): Payer: Self-pay | Admitting: General Surgery

## 2021-01-14 ENCOUNTER — Ambulatory Visit (HOSPITAL_COMMUNITY)
Admission: RE | Admit: 2021-01-14 | Discharge: 2021-01-14 | Disposition: A | Discharge status: Home / Self Care | Payer: Medicare PPO | Attending: General Surgery | Admitting: General Surgery

## 2021-01-14 ENCOUNTER — Encounter (HOSPITAL_COMMUNITY)
Admission: RE | Disposition: A | Discharge status: Home / Self Care | Payer: Self-pay | Source: Home / Self Care | Attending: General Surgery

## 2021-01-14 DIAGNOSIS — I129 Hypertensive chronic kidney disease with stage 1 through stage 4 chronic kidney disease, or unspecified chronic kidney disease: Secondary | ICD-10-CM | POA: Diagnosis not present

## 2021-01-14 DIAGNOSIS — N189 Chronic kidney disease, unspecified: Secondary | ICD-10-CM | POA: Diagnosis not present

## 2021-01-14 DIAGNOSIS — K819 Cholecystitis, unspecified: Secondary | ICD-10-CM

## 2021-01-14 DIAGNOSIS — K811 Chronic cholecystitis: Secondary | ICD-10-CM | POA: Insufficient documentation

## 2021-01-14 DIAGNOSIS — D759 Disease of blood and blood-forming organs, unspecified: Secondary | ICD-10-CM | POA: Diagnosis not present

## 2021-01-14 DIAGNOSIS — K828 Other specified diseases of gallbladder: Secondary | ICD-10-CM | POA: Diagnosis not present

## 2021-01-14 DIAGNOSIS — N183 Chronic kidney disease, stage 3 unspecified: Secondary | ICD-10-CM | POA: Diagnosis not present

## 2021-01-14 DIAGNOSIS — K810 Acute cholecystitis: Secondary | ICD-10-CM

## 2021-01-14 DIAGNOSIS — Z8719 Personal history of other diseases of the digestive system: Secondary | ICD-10-CM | POA: Insufficient documentation

## 2021-01-14 DIAGNOSIS — Z9359 Other cystostomy status: Secondary | ICD-10-CM | POA: Diagnosis not present

## 2021-01-14 DIAGNOSIS — E1122 Type 2 diabetes mellitus with diabetic chronic kidney disease: Secondary | ICD-10-CM | POA: Diagnosis not present

## 2021-01-14 DIAGNOSIS — K82A1 Gangrene of gallbladder in cholecystitis: Secondary | ICD-10-CM | POA: Insufficient documentation

## 2021-01-14 DIAGNOSIS — D649 Anemia, unspecified: Secondary | ICD-10-CM | POA: Insufficient documentation

## 2021-01-14 DIAGNOSIS — K812 Acute cholecystitis with chronic cholecystitis: Secondary | ICD-10-CM | POA: Diagnosis not present

## 2021-01-14 HISTORY — PX: CHOLECYSTECTOMY: SHX55

## 2021-01-14 LAB — GLUCOSE, CAPILLARY: Glucose-Capillary: 153 mg/dL — ABNORMAL HIGH (ref 70–99)

## 2021-01-14 SURGERY — LAPAROSCOPIC CHOLECYSTECTOMY
Anesthesia: General | Site: Abdomen

## 2021-01-14 MED ORDER — SODIUM CHLORIDE 0.9 % IV SOLN
2.0000 g | INTRAVENOUS | Status: AC
  Administered 2021-01-14: 2 g via INTRAVENOUS

## 2021-01-14 MED ORDER — ROCURONIUM BROMIDE 10 MG/ML (PF) SYRINGE
PREFILLED_SYRINGE | INTRAVENOUS | Status: DC | PRN
  Administered 2021-01-14: 50 mg via INTRAVENOUS
  Administered 2021-01-14: 10 mg via INTRAVENOUS

## 2021-01-14 MED ORDER — OXYCODONE HCL 5 MG PO TABS
5.0000 mg | ORAL_TABLET | Freq: Once | ORAL | Status: AC
  Administered 2021-01-14: 5 mg via ORAL

## 2021-01-14 MED ORDER — HEMOSTATIC AGENTS (NO CHARGE) OPTIME
TOPICAL | Status: DC | PRN
  Administered 2021-01-14 (×2): 1

## 2021-01-14 MED ORDER — PROPOFOL 10 MG/ML IV BOLUS
INTRAVENOUS | Status: DC | PRN
  Administered 2021-01-14: 80 mg via INTRAVENOUS
  Administered 2021-01-14: 120 mg via INTRAVENOUS

## 2021-01-14 MED ORDER — CHLORHEXIDINE GLUCONATE 0.12 % MT SOLN
15.0000 mL | Freq: Once | OROMUCOSAL | Status: AC
  Administered 2021-01-14: 15 mL via OROMUCOSAL

## 2021-01-14 MED ORDER — AMOXICILLIN-POT CLAVULANATE 875-125 MG PO TABS: 1.0000 | ORAL_TABLET | Freq: Two times a day (BID) | ORAL | 0 refills | Status: AC

## 2021-01-14 MED ORDER — ROCURONIUM BROMIDE 10 MG/ML (PF) SYRINGE
PREFILLED_SYRINGE | INTRAVENOUS | Status: AC
  Filled 2021-01-14: qty 10

## 2021-01-14 MED ORDER — SUGAMMADEX SODIUM 200 MG/2ML IV SOLN
INTRAVENOUS | Status: DC | PRN
  Administered 2021-01-14: 200 mg via INTRAVENOUS

## 2021-01-14 MED ORDER — FENTANYL CITRATE PF 50 MCG/ML IJ SOSY: 25.0000 ug | PREFILLED_SYRINGE | INTRAMUSCULAR | Status: DC | PRN

## 2021-01-14 MED ORDER — FENTANYL CITRATE (PF) 100 MCG/2ML IJ SOLN
INTRAMUSCULAR | Status: DC | PRN
  Administered 2021-01-14 (×3): 50 ug via INTRAVENOUS
  Administered 2021-01-14: 100 ug via INTRAVENOUS

## 2021-01-14 MED ORDER — DEXAMETHASONE SODIUM PHOSPHATE 10 MG/ML IJ SOLN
INTRAMUSCULAR | Status: AC
  Filled 2021-01-14: qty 1

## 2021-01-14 MED ORDER — CHLORHEXIDINE GLUCONATE CLOTH 2 % EX PADS: 6.0000 | MEDICATED_PAD | Freq: Once | CUTANEOUS | Status: DC

## 2021-01-14 MED ORDER — FENTANYL CITRATE (PF) 250 MCG/5ML IJ SOLN
INTRAMUSCULAR | Status: AC
  Filled 2021-01-14: qty 5

## 2021-01-14 MED ORDER — KETOROLAC TROMETHAMINE 30 MG/ML IJ SOLN
INTRAMUSCULAR | Status: AC
  Filled 2021-01-14: qty 1

## 2021-01-14 MED ORDER — LIDOCAINE 2% (20 MG/ML) 5 ML SYRINGE
INTRAMUSCULAR | Status: DC | PRN
  Administered 2021-01-14: 100 mg via INTRAVENOUS

## 2021-01-14 MED ORDER — KETOROLAC TROMETHAMINE 30 MG/ML IJ SOLN
INTRAMUSCULAR | Status: DC | PRN
  Administered 2021-01-14: 15 mg via INTRAVENOUS

## 2021-01-14 MED ORDER — ONDANSETRON HCL 4 MG PO TABS: 4.0000 mg | ORAL_TABLET | Freq: Three times a day (TID) | ORAL | 1 refills | Status: DC | PRN

## 2021-01-14 MED ORDER — BUPIVACAINE HCL (PF) 0.5 % IJ SOLN
INTRAMUSCULAR | Status: AC
  Filled 2021-01-14: qty 30

## 2021-01-14 MED ORDER — OXYCODONE HCL 5 MG PO TABS: 5.0000 mg | ORAL_TABLET | ORAL | 0 refills | Status: DC | PRN

## 2021-01-14 MED ORDER — ONDANSETRON HCL 4 MG/2ML IJ SOLN: 4.0000 mg | Freq: Once | INTRAMUSCULAR | Status: DC | PRN

## 2021-01-14 MED ORDER — ONDANSETRON HCL 4 MG/2ML IJ SOLN
INTRAMUSCULAR | Status: DC | PRN
  Administered 2021-01-14: 4 mg via INTRAVENOUS

## 2021-01-14 MED ORDER — ORAL CARE MOUTH RINSE: 15.0000 mL | Freq: Once | OROMUCOSAL | Status: AC

## 2021-01-14 MED ORDER — MIDAZOLAM HCL 2 MG/2ML IJ SOLN
INTRAMUSCULAR | Status: AC
  Filled 2021-01-14: qty 2

## 2021-01-14 MED ORDER — LIDOCAINE HCL (PF) 2 % IJ SOLN
INTRAMUSCULAR | Status: AC
  Filled 2021-01-14: qty 5

## 2021-01-14 MED ORDER — ONDANSETRON HCL 4 MG/2ML IJ SOLN
INTRAMUSCULAR | Status: AC
  Filled 2021-01-14: qty 2

## 2021-01-14 MED ORDER — BUPIVACAINE HCL (PF) 0.5 % IJ SOLN
INTRAMUSCULAR | Status: DC | PRN
  Administered 2021-01-14: 10 mL

## 2021-01-14 MED ORDER — SODIUM CHLORIDE 0.9 % IR SOLN
Status: DC | PRN
  Administered 2021-01-14: 1000 mL

## 2021-01-14 MED ORDER — LACTATED RINGERS IV SOLN: INTRAVENOUS | Status: DC

## 2021-01-14 MED ORDER — DEXAMETHASONE SODIUM PHOSPHATE 10 MG/ML IJ SOLN
INTRAMUSCULAR | Status: DC | PRN
  Administered 2021-01-14: 5 mg via INTRAVENOUS

## 2021-01-14 MED ORDER — OXYCODONE HCL 5 MG PO TABS
ORAL_TABLET | ORAL | Status: AC
  Filled 2021-01-14: qty 1

## 2021-01-14 MED ORDER — PROPOFOL 10 MG/ML IV BOLUS
INTRAVENOUS | Status: AC
  Filled 2021-01-14: qty 20

## 2021-01-14 MED ORDER — SODIUM CHLORIDE 0.9 % IV SOLN
INTRAVENOUS | Status: AC
  Filled 2021-01-14: qty 2

## 2021-01-14 MED ORDER — MIDAZOLAM HCL 5 MG/5ML IJ SOLN
INTRAMUSCULAR | Status: DC | PRN
  Administered 2021-01-14: 2 mg via INTRAVENOUS

## 2021-01-14 SURGICAL SUPPLY — 61 items
ADH SKN CLS APL DERMABOND .7 (GAUZE/BANDAGES/DRESSINGS)
APL PRP STRL LF DISP 70% ISPRP (MISCELLANEOUS) ×1
APL SRG 38 LTWT LNG FL B (MISCELLANEOUS) ×1
APPLICATOR ARISTA FLEXITIP XL (MISCELLANEOUS) ×2 IMPLANT
APPLIER CLIP ROT 10 11.4 M/L (STAPLE) ×3
APR CLP MED LRG 11.4X10 (STAPLE) ×1
BAG RETRIEVAL 10 (BASKET) ×1
BAG RETRIEVAL 10MM (BASKET) ×1
BLADE SURG 15 STRL LF DISP TIS (BLADE) ×1 IMPLANT
BLADE SURG 15 STRL SS (BLADE) ×3
CHLORAPREP W/TINT 26 (MISCELLANEOUS) ×3 IMPLANT
CLIP APPLIE ROT 10 11.4 M/L (STAPLE) ×1 IMPLANT
CLOTH BEACON ORANGE TIMEOUT ST (SAFETY) ×3 IMPLANT
COVER LIGHT HANDLE STERIS (MISCELLANEOUS) ×6 IMPLANT
CUTTER FLEX LINEAR 45M (STAPLE) ×2 IMPLANT
DERMABOND ADVANCED (GAUZE/BANDAGES/DRESSINGS)
DERMABOND ADVANCED .7 DNX12 (GAUZE/BANDAGES/DRESSINGS) ×1 IMPLANT
DISSECTOR BLUNT TIP ENDO 5MM (MISCELLANEOUS) ×2 IMPLANT
DRSG TEGADERM 2-3/8X2-3/4 SM (GAUZE/BANDAGES/DRESSINGS) ×8 IMPLANT
ELECT REM PT RETURN 9FT ADLT (ELECTROSURGICAL) ×3
ELECTRODE REM PT RTRN 9FT ADLT (ELECTROSURGICAL) ×1 IMPLANT
EVACUATOR DRAINAGE 10X20 100CC (DRAIN) IMPLANT
EVACUATOR SILICONE 100CC (DRAIN) ×3
GAUZE 4X4 16PLY ~~LOC~~+RFID DBL (SPONGE) ×3 IMPLANT
GAUZE SPONGE 4X4 12PLY STRL (GAUZE/BANDAGES/DRESSINGS) ×2 IMPLANT
GLOVE SURG ENC MOIS LTX SZ6.5 (GLOVE) ×3 IMPLANT
GLOVE SURG POLYISO LF SZ6.5 (GLOVE) ×2 IMPLANT
GLOVE SURG POLYISO LF SZ7.5 (GLOVE) ×2 IMPLANT
GLOVE SURG UNDER POLY LF SZ7 (GLOVE) ×12 IMPLANT
GOWN STRL REUS W/TWL LRG LVL3 (GOWN DISPOSABLE) ×9 IMPLANT
HEMOSTAT ARISTA ABSORB 3G PWDR (HEMOSTASIS) ×2 IMPLANT
HEMOSTAT SNOW SURGICEL 2X4 (HEMOSTASIS) ×3 IMPLANT
INST SET LAPROSCOPIC AP (KITS) ×3 IMPLANT
KIT TURNOVER KIT A (KITS) ×3 IMPLANT
MANIFOLD NEPTUNE II (INSTRUMENTS) ×3 IMPLANT
NDL INSUFFLATION 14GA 120MM (NEEDLE) ×1 IMPLANT
NEEDLE INSUFFLATION 14GA 120MM (NEEDLE) ×3 IMPLANT
NS IRRIG 1000ML POUR BTL (IV SOLUTION) ×3 IMPLANT
PACK LAP CHOLE LZT030E (CUSTOM PROCEDURE TRAY) ×3 IMPLANT
PAD ARMBOARD 7.5X6 YLW CONV (MISCELLANEOUS) ×3 IMPLANT
RELOAD STAPLE 45 3.5 BLU ETS (ENDOMECHANICALS) IMPLANT
RELOAD STAPLE TA45 3.5 REG BLU (ENDOMECHANICALS) ×3 IMPLANT
SET BASIN LINEN APH (SET/KITS/TRAYS/PACK) ×3 IMPLANT
SET TUBE IRRIG SUCTION NO TIP (IRRIGATION / IRRIGATOR) ×2 IMPLANT
SET TUBE SMOKE EVAC HIGH FLOW (TUBING) ×3 IMPLANT
SLEEVE ENDOPATH XCEL 5M (ENDOMECHANICALS) ×3 IMPLANT
SPONGE DRAIN TRACH 4X4 STRL 2S (GAUZE/BANDAGES/DRESSINGS) ×2 IMPLANT
SPONGE GAUZE 2X2 8PLY STER LF (GAUZE/BANDAGES/DRESSINGS) ×4
SPONGE GAUZE 2X2 8PLY STRL LF (GAUZE/BANDAGES/DRESSINGS) ×4 IMPLANT
STAPLER VISISTAT (STAPLE) ×2 IMPLANT
SUT ETHILON 3 0 FSL (SUTURE) ×2 IMPLANT
SUT MNCRL AB 4-0 PS2 18 (SUTURE) ×6 IMPLANT
SUT VICRYL 0 UR6 27IN ABS (SUTURE) ×3 IMPLANT
SYS BAG RETRIEVAL 10MM (BASKET) ×1
SYSTEM BAG RETRIEVAL 10MM (BASKET) ×1 IMPLANT
TROCAR ENDO BLADELESS 11MM (ENDOMECHANICALS) ×3 IMPLANT
TROCAR XCEL NON-BLD 5MMX100MML (ENDOMECHANICALS) ×3 IMPLANT
TROCAR XCEL UNIV SLVE 11M 100M (ENDOMECHANICALS) ×3 IMPLANT
TUBE CONNECTING 12'X1/4 (SUCTIONS) ×1
TUBE CONNECTING 12X1/4 (SUCTIONS) ×2 IMPLANT
WARMER LAPAROSCOPE (MISCELLANEOUS) ×3 IMPLANT

## 2021-01-14 NOTE — Anesthesia Postprocedure Evaluation (Signed)
Anesthesia Post Note  Patient: James Glover  Procedure(s) Performed: LAPAROSCOPIC CHOLECYSTECTOMY; REMOVAL OF CHOLECYSTOSTOMY DRAIN (Abdomen)  Patient location during evaluation: Phase II Anesthesia Type: General Level of consciousness: awake Pain management: pain level controlled Vital Signs Assessment: post-procedure vital signs reviewed and stable Respiratory status: spontaneous breathing and respiratory function stable Cardiovascular status: blood pressure returned to baseline and stable Postop Assessment: no headache and no apparent nausea or vomiting Anesthetic complications: no Comments: Late entry   No notable events documented.   Last Vitals:  Vitals:   01/14/21 1208 01/14/21 1215  BP: (!) 168/61 (!) 158/61  Pulse:  71  Resp:  19  Temp: (!) 36.4 C   SpO2:  100%    Last Pain:  Vitals:   01/14/21 1208  TempSrc:   PainSc: [place]

## 2021-01-14 NOTE — Op Note (Signed)
Operative Note   Preoperative Diagnosis: Cholecystitis    Postoperative Diagnosis: Gangrenous cholecystitis    Procedure(s) Performed: Laparoscopic cholecystectomy, removal cholecystostomy tube, JP drain placement    Surgeon: Lanell Matar. Constance Haw, MD   Assistants: Aviva Signs, MD    Anesthesia: General endotracheal   Anesthesiologist: Louann Sjogren, MD    Specimens: Gallbladder    Estimated Blood Loss: Minimal    Blood Replacement: None    Complications: None    Operative Findings: Gangrenous dome of the gallbladder, contracted with significant rind   Procedure: The patient was taken to the operating room and placed supine. General endotracheal anesthesia was induced. Intravenous antibiotics were  administered per protocol. An orogastric tube positioned to decompress the stomach. The abdomen was prepared and draped in the usual sterile fashion.    A supraumbilical incision was made and a Veress technique was utilized to achieve pneumoperitoneum to 15 mmHg with carbon dioxide. A 11 mm optiview port was placed through the supraumbilical region, and a 10 mm 0-degree operative laparoscope was introduced. The area underlying the trocar and Veress needle were inspected and without evidence of injury.  Remaining trocars were placed under direct vision. Two 5 mm ports were placed in the right abdomen, between the anterior axillary and midclavicular line.  A final 11 mm port was placed through the mid-epigastrium, near the falciform ligament.  The cholecystostomy tube was clipped and removed in its entirety. The gallbladder was very contracted and fibrotic with a thick rind.    The gallbladder dome was necrotic and gangrenous. The lateral edges and dome were dissected out to allow for more mobilization upward of the mid portion. There was a hard fibrotic rind around the junction of the gallbladder wall and the liver bed but he had prior abscesses in this area too.  The mid portion of the  gallbladder was elevated cephalad and the infundibulum was retracted to the patient's right. The inferior edge of the gallbladder was defined and the rind and deeper structures were swept down with blunt dissection.  The gallbladder/cystic duct junction was skeletonized. The cystic artery was noted noted in the triangle of Calot and was coming off of a larger vessels.   We then continued liberal medial and lateral dissection until the critical view of safety was achieved.    The cystic artery was clipped at the edge of the gallbladder once and cauterized from the gallbladder side given the proximity to the larger vessel. The gallbladder was on pedicle and the other deeper structures and rind had been swept down. The cystic duct was wide and was taken right at the edge of the gallbladder to ensure there was no chance in getting the deeper structures. This was taken with a standard endo GIA stapler.  The gallbladder was then dissected from the remaining liver bed with electrocautery. The specimen was placed in an Endopouch and was retrieved through the epigastric site.   Final inspection revealed acceptable hemostasis. Arista and Surgical SNOW was placed in the gallbladder bed.  A JP drain was brought out the lateral most port and secured with a 3-0 nylon suture. Trocars were removed and pneumoperitoneum was released.  The epigastric and umbilical port sites were overlying the rib and smaller than my finger tip. Skin incisions were closed with 4-0 Monocryl subcuticular sutures and Dermabond. The patient was awakened from anesthesia and extubated without complication.    Curlene Labrum, MD [facility] [address] [place],  [id number] [phone] (office)

## 2021-01-14 NOTE — Discharge Instructions (Addendum)
Discharge Laparoscopic Surgery Instructions:  Common Complaints: Right shoulder pain is common after laparoscopic surgery. This is secondary to the gas used in the surgery being trapped under the diaphragm.  Walk to help your body absorb the gas. This will improve in a few days. Pain at the port sites are common, especially the larger port sites. This will improve with time.  Some nausea is common and poor appetite. The main goal is to stay hydrated the first few days after surgery.   Diet/ Activity: Diet as tolerated. You may not have an appetite, but it is important to stay hydrated. Drink 64 ounces of water a day. Your appetite will return with time.  Shower per your regular routine daily.  Keep the drain site covered when showering.  Rest and listen to your body, but do not remain in bed all day.  Walk everyday for at least 15-20 minutes. Deep cough and move around every 1-2 hours in the first few days after surgery.  Do not lift > 10 lbs, perform excessive bending, pushing, pulling, squatting for 1-2 weeks after surgery.  Remove the bandages on 01/16/2021. Do not pick at the staples. Do not place lotions or balms on your incision unless instructed to specifically by Dr. Constance Haw.  Take the antibiotics as prescribed since you had continued infection of the gallbladder.   JP Drain Care:  Please keep the drain clean and dry. Replace the gauze/ tape around the drain if it gets dirty or wet/ saturated. Please do not mess with or cut the stitch that is keeping the drain in place. Secure the drain to your clothes so that it does not get dislodged.  Please record the output from the drain daily including the color and the amount in milliliters.  Keep the drain collapsed down after you keep it (squeeze the air out of it).  Please keep the drain covered with plastic and tape when you shower so that it does not get wet.  Drain output should be reddish brown to light yellow but let us know if it  becomes green.   Pain Expectations and Narcotics: -After surgery you will have pain associated with your incisions and this is normal. The pain is muscular and nerve pain, and will get better with time. -You are encouraged and expected to take non narcotic medications like tylenol and ibuprofen (when able) to treat pain as multiple modalities can aid with pain treatment. -Narcotics are only used when pain is severe or there is breakthrough pain. -You are not expected to have a pain score of 0 after surgery, as we cannot prevent pain. A pain score of 3-4 that allows you to be functional, move, walk, and tolerate some activity is the goal. The pain will continue to improve over the days after surgery and is dependent on your surgery. -Due to [place] law, we are only able to give a certain amount of pain medication to treat post operative pain, and we only give additional narcotics on a patient by patient basis.  -For most laparoscopic surgery, studies have shown that the majority of patients only need 10-15 narcotic pills, and for open surgeries most patients only need 15-20.   -Having appropriate expectations of pain and knowledge of pain management with non narcotics is important as we do not want anyone to become addicted to narcotic pain medication.  -Using ice packs in the first 48 hours and heating pads after 48 hours, wearing an abdominal binder (when recommended), and using over  the counter medications are all ways to help with pain management.   -Simple acts like meditation and mindfulness practices after surgery can also help with pain control and research has proven the benefit of these practices.  Medication: Take tylenol and ibuprofen as needed for pain control, alternating every 4-6 hours.  Example:  Tylenol 1000mg  @ 6am, 12noon, 6pm, 62midnight (Do not exceed 4000mg  of tylenol a day). Ibuprofen 800mg  @ 9am, 3pm, 9pm, 3am (Do not exceed 3600mg  of ibuprofen a day).  Take Roxicodone for  breakthrough pain every 4 hours.  Take Colace for constipation related to narcotic pain medication. If you do not have a bowel movement in 2 days, take Miralax over the counter.  Drink plenty of water to also prevent constipation.   Contact Information: If you have questions or concerns, please call our office, [phone], Monday- Thursday 8AM-5PM and Friday 8AM-12Noon.  If it is after hours or on the weekend, please call [facility], [phone], [phone], and ask to speak to the surgeon on call for Dr. Constance Haw at [facility].

## 2021-01-14 NOTE — Progress Notes (Signed)
[facility]  Updated wife. JP drain in place, will remove this and staples next Tuesday. Keep record of the drain output and color daily. Bring this with you to clinic.   Drain output should be reddish brown to light yellow but let us know if it becomes green.  Curlene Labrum, MD [facility] [address] [place], [place] [address] [phone] (office)

## 2021-01-14 NOTE — Anesthesia Preprocedure Evaluation (Signed)
Anesthesia Evaluation  Patient identified by MRN, date of birth, ID band Patient awake    Reviewed: Allergy & Precautions, H&P , NPO status , Patient's Chart, lab work & pertinent test results, reviewed documented beta blocker date and time   Airway Mallampati: II  TM Distance: >3 FB Neck ROM: full    Dental no notable dental hx.    Pulmonary neg pulmonary ROS,    Pulmonary exam normal breath sounds clear to auscultation       Cardiovascular Exercise Tolerance: Good hypertension, negative cardio ROS   Rhythm:regular Rate:Normal     Neuro/Psych negative neurological ROS  negative psych ROS   GI/Hepatic Neg liver ROS, GERD  Medicated,  Endo/Other  negative endocrine ROSdiabetes  Renal/GU CRFRenal disease  negative genitourinary   Musculoskeletal   Abdominal   Peds  Hematology  (+) Blood dyscrasia, anemia ,   Anesthesia Other Findings   Reproductive/Obstetrics negative OB ROS                             Anesthesia Physical Anesthesia Plan  ASA: 3  Anesthesia Plan: General and General ETT   Post-op Pain Management:    Induction:   PONV Risk Score and Plan: Ondansetron  Airway Management Planned:   Additional Equipment:   Intra-op Plan:   Post-operative Plan:   Informed Consent: I have reviewed the patients History and Physical, chart, labs and discussed the procedure including the risks, benefits and alternatives for the proposed anesthesia with the patient or authorized representative who has indicated his/her understanding and acceptance.     Dental Advisory Given  Plan Discussed with: CRNA  Anesthesia Plan Comments:         Anesthesia Quick Evaluation

## 2021-01-14 NOTE — Transfer of Care (Signed)
Immediate Anesthesia Transfer of Care Note  Patient: James Glover  Procedure(s) Performed: LAPAROSCOPIC CHOLECYSTECTOMY; REMOVAL OF CHOLECYSTOSTOMY DRAIN (Abdomen)  Patient Location: PACU  Anesthesia Type:General  Level of Consciousness: awake  Airway & Oxygen Therapy: Patient Spontanous Breathing and Patient connected to nasal cannula oxygen  Post-op Assessment: Report given to RN and Post -op Vital signs reviewed and stable  Post vital signs: Reviewed and stable  Last Vitals:  Vitals Value Taken Time  BP 168/61 01/14/21 1208  Temp    Pulse 70 01/14/21 1209  Resp 18 01/14/21 1209  SpO2 100 % 01/14/21 1209  Vitals shown include unvalidated device data.  Last Pain:  Vitals:   01/14/21 0835  TempSrc: Oral  PainSc: 0-No pain      Patients Stated Pain Goal: 4 (71/24/58 0998)  Complications: No notable events documented.

## 2021-01-14 NOTE — Interval H&P Note (Signed)
History and Physical Interval Note:  01/14/2021 8:35 AM  James Glover  has presented today for surgery, with the diagnosis of Acute cholecystitis.  The various methods of treatment have been discussed with the patient and family. After consideration of risks, benefits and other options for treatment, the patient has consented to  Procedure(s): LAPAROSCOPIC CHOLECYSTECTOMY (N/A) as a surgical intervention.  The patient's history has been reviewed, patient examined, no change in status, stable for surgery.  I have reviewed the patient's chart and labs.  Questions were answered to the patient's satisfaction.     Virl Cagey

## 2021-01-14 NOTE — Anesthesia Procedure Notes (Signed)
Procedure Name: Intubation Date/Time: 01/14/2021 10:20 AM Performed by: Orlie Dakin, CRNA Pre-anesthesia Checklist: Patient identified, Emergency Drugs available, Suction available and Patient being monitored Patient Re-evaluated:Patient Re-evaluated prior to induction Oxygen Delivery Method: Circle system utilized Preoxygenation: Pre-oxygenation with 100% oxygen Induction Type: IV induction Ventilation: Mask ventilation without difficulty and Oral airway inserted - appropriate to patient size Laryngoscope Size: Glidescope and 3 Grade View: Grade I Tube type: Oral Tube size: 7.5 mm Number of attempts: 3 Airway Equipment and Method: Rigid stylet and Video-laryngoscopy Placement Confirmation: ETT inserted through vocal cords under direct vision, positive ETCO2 and breath sounds checked- equal and bilateral Secured at: 23 cm Tube secured with: Tape Dental Injury: Teeth and Oropharynx as per pre-operative assessment  Comments: DL x 2 with Sabra Heck 3, poor view due to large floppy epiglottis.  3rd DL with Glidescope as above.

## 2021-01-15 ENCOUNTER — Encounter (HOSPITAL_COMMUNITY): Payer: Self-pay | Admitting: General Surgery

## 2021-01-15 LAB — SURGICAL PATHOLOGY

## 2021-01-20 ENCOUNTER — Ambulatory Visit (INDEPENDENT_AMBULATORY_CARE_PROVIDER_SITE_OTHER): Payer: Medicare PPO | Admitting: General Surgery

## 2021-01-20 ENCOUNTER — Encounter: Payer: Self-pay | Admitting: General Surgery

## 2021-01-20 ENCOUNTER — Other Ambulatory Visit: Payer: Self-pay

## 2021-01-20 VITALS — BP 143/78 | HR 65 | Temp 98.4°F | Resp 16 | Ht 76.0 in | Wt 248.0 lb

## 2021-01-20 DIAGNOSIS — K810 Acute cholecystitis: Secondary | ICD-10-CM

## 2021-01-20 NOTE — Progress Notes (Signed)
[facility]  Doing well. JP with serous output 25cc daily.   BP (!) 143/78   Pulse 65   Temp 98.4 F (36.9 C) (Other (Comment))   Resp 16   Ht 6\' 4"  (1.93 m)   Wt 248 lb (112.5 kg)   SpO2 97%   BMI 30.19 kg/m  Soft, nondistended, incisions c/d/I   Staples removed, incisions c/d/I and JP removed  Steri strips placed   Pathology: FINAL MICROSCOPIC DIAGNOSIS:   A.GALLBLADDER, CHOLECYSTECTOMY:  Acute focally hemorrhagic and gangrenous cholecystitis superimposed on  chronic acalculous cholecystitis.  Negative for neoplasm.    Patient s/p cholecystectomy and cholecystostomy tube removal.  Diet and activity as tolerated. Remove bandage in 48 hours, replace if needed, expect some drainage until the opening seals up. Steristrips will peel off in the next 5-7 days. You can remove them once they are peeling off. It is ok to shower.  Pat the area dry.  Miralax for constipation.    Need to still get the colonoscopy given the bacteria that grew in the liver abscess.  Curlene Labrum, MD [facility] [address] [place], [place] [address] [phone] (office)

## 2021-01-20 NOTE — Patient Instructions (Addendum)
Diet and activity as tolerated. Remove bandage in 48 hours, replace if needed, expect some drainage until the opening seals up. Steristrips will peel off in the next 5-7 days. You can remove them once they are peeling off. It is ok to shower.  Pat the area dry.  Miralax for constipation.   Need to still get the colonoscopy given the bacteria that grew in the liver abscess.

## 2021-01-28 ENCOUNTER — Other Ambulatory Visit: Payer: Self-pay

## 2021-01-28 NOTE — Patient Outreach (Signed)
James Glover) Care Management  01/28/2021  James Glover May 30, 1945 [id number]   Telephone Assessment    Unsuccessful outreach attempt to patient-no answer after several rings.     Plan: RN CM will make outreach attempt to patient within 3-4 business days.   James Montgomery, RN,BSN,CCM [facility] Management Telephonic Care Management Coordinator Direct Phone: [phone] Toll Free: [phone] Fax: [phone]

## 2021-01-30 ENCOUNTER — Other Ambulatory Visit: Payer: Self-pay

## 2021-01-30 NOTE — Patient Outreach (Signed)
[facility]) Care Management  01/30/2021  MACE WEINBERG 09-15-1945 [id number]   Telephone Assessment    Unsuccessful outreach attempt to patient. RN CM left HIPAA compliant voicemail along with contact info.      Plan: RN CM will make outreach attempt to patient within 3-4 business days. RN CM will send unsuccessful outreach letter to patient.  Enzo Montgomery, RN,BSN,CCM [facility] Telephonic Care Management Coordinator Direct Phone: [phone] Toll Free: [phone] Fax: [phone]

## 2021-02-02 ENCOUNTER — Telehealth: Payer: Self-pay

## 2021-02-02 MED ORDER — AMLODIPINE BESYLATE 10 MG PO TABS: 10.0000 mg | ORAL_TABLET | Freq: Every day | ORAL | 0 refills | Status: DC

## 2021-02-02 NOTE — Telephone Encounter (Signed)
Yes, okay for 90d

## 2021-02-02 NOTE — Telephone Encounter (Signed)
This is on the amlodipine 10mg , is this ok?

## 2021-02-02 NOTE — Telephone Encounter (Signed)
Pt. Wife called stating her husband needs his BP medicine refilled but it was changed by the doctor in the hospital and he can not refill it. She wanted to know if you could refill that for him and if he still needed to stay on the same mg the hospital doctor changed it to.

## 2021-02-02 NOTE — Telephone Encounter (Signed)
Done

## 2021-02-03 ENCOUNTER — Other Ambulatory Visit: Payer: Self-pay

## 2021-02-03 NOTE — Patient Outreach (Signed)
[facility]) Care Management  02/03/2021  GREGOIRE BENNIS 10/09/1945 [id number]   Telephone Assessment    Unsuccessful outreach attempt to patient.     Plan: RN CM will make outreach attempt to patient within the month of Jan..   Zandon Talton Verl [place] Management Telephonic Care Management Coordinator Direct Phone: [phone] Toll Free: [phone] Fax: [phone]

## 2021-02-04 ENCOUNTER — Ambulatory Visit: Payer: Self-pay

## 2021-02-06 DIAGNOSIS — L570 Actinic keratosis: Secondary | ICD-10-CM | POA: Diagnosis not present

## 2021-02-06 DIAGNOSIS — Z23 Encounter for immunization: Secondary | ICD-10-CM | POA: Diagnosis not present

## 2021-02-06 DIAGNOSIS — L723 Sebaceous cyst: Secondary | ICD-10-CM | POA: Diagnosis not present

## 2021-02-09 DIAGNOSIS — C61 Malignant neoplasm of prostate: Secondary | ICD-10-CM | POA: Diagnosis not present

## 2021-02-10 DIAGNOSIS — N179 Acute kidney failure, unspecified: Secondary | ICD-10-CM | POA: Diagnosis not present

## 2021-02-10 DIAGNOSIS — N2581 Secondary hyperparathyroidism of renal origin: Secondary | ICD-10-CM | POA: Diagnosis not present

## 2021-02-10 DIAGNOSIS — E785 Hyperlipidemia, unspecified: Secondary | ICD-10-CM | POA: Diagnosis not present

## 2021-02-10 DIAGNOSIS — R600 Localized edema: Secondary | ICD-10-CM | POA: Diagnosis not present

## 2021-02-10 DIAGNOSIS — I129 Hypertensive chronic kidney disease with stage 1 through stage 4 chronic kidney disease, or unspecified chronic kidney disease: Secondary | ICD-10-CM | POA: Diagnosis not present

## 2021-02-10 DIAGNOSIS — Z23 Encounter for immunization: Secondary | ICD-10-CM | POA: Diagnosis not present

## 2021-02-10 DIAGNOSIS — N1831 Chronic kidney disease, stage 3a: Secondary | ICD-10-CM | POA: Diagnosis not present

## 2021-02-10 DIAGNOSIS — E611 Iron deficiency: Secondary | ICD-10-CM | POA: Diagnosis not present

## 2021-02-10 DIAGNOSIS — E1122 Type 2 diabetes mellitus with diabetic chronic kidney disease: Secondary | ICD-10-CM | POA: Diagnosis not present

## 2021-02-10 LAB — MICROALBUMIN, URINE: Microalb, Ur: 8.8

## 2021-02-10 LAB — MICROALBUMIN / CREATININE URINE RATIO: Microalb Creat Ratio: 10

## 2021-02-16 DIAGNOSIS — D4101 Neoplasm of uncertain behavior of right kidney: Secondary | ICD-10-CM | POA: Diagnosis not present

## 2021-02-16 DIAGNOSIS — N5201 Erectile dysfunction due to arterial insufficiency: Secondary | ICD-10-CM | POA: Diagnosis not present

## 2021-02-16 DIAGNOSIS — N183 Chronic kidney disease, stage 3 unspecified: Secondary | ICD-10-CM | POA: Diagnosis not present

## 2021-02-16 DIAGNOSIS — C61 Malignant neoplasm of prostate: Secondary | ICD-10-CM | POA: Diagnosis not present

## 2021-02-17 DIAGNOSIS — Z23 Encounter for immunization: Secondary | ICD-10-CM | POA: Diagnosis not present

## 2021-02-17 DIAGNOSIS — L219 Seborrheic dermatitis, unspecified: Secondary | ICD-10-CM | POA: Diagnosis not present

## 2021-02-17 DIAGNOSIS — L723 Sebaceous cyst: Secondary | ICD-10-CM | POA: Diagnosis not present

## 2021-02-26 DIAGNOSIS — K219 Gastro-esophageal reflux disease without esophagitis: Secondary | ICD-10-CM | POA: Diagnosis not present

## 2021-02-26 DIAGNOSIS — R634 Abnormal weight loss: Secondary | ICD-10-CM | POA: Diagnosis not present

## 2021-02-26 DIAGNOSIS — Z1211 Encounter for screening for malignant neoplasm of colon: Secondary | ICD-10-CM | POA: Diagnosis not present

## 2021-02-26 DIAGNOSIS — D638 Anemia in other chronic diseases classified elsewhere: Secondary | ICD-10-CM | POA: Diagnosis not present

## 2021-02-26 DIAGNOSIS — K5730 Diverticulosis of large intestine without perforation or abscess without bleeding: Secondary | ICD-10-CM | POA: Diagnosis not present

## 2021-03-03 ENCOUNTER — Other Ambulatory Visit (HOSPITAL_COMMUNITY): Payer: Medicare PPO

## 2021-03-05 ENCOUNTER — Other Ambulatory Visit: Payer: Medicare PPO

## 2021-03-08 NOTE — Progress Notes (Signed)
Chief Complaint  Patient presents with   Medicare Wellness    Fasting (ate at 9am) AWV. Did see [facility] Dec 19th (I did not see any notes up front). No new concerns-feels like he is improving each day. Does not want covid booster. Did not yet Tdap or Shingrix.     James Glover is a 76 y.o. male who presents for annual physical exam, Medicare wellness visit and follow-up on chronic medical conditions.    He complains of R nipple soreness x 3-4 weeks.  Denies any drainage or lumps. Denies any trauma.  He is also complaining of yeast infection at his groin, spreading back to his buttocks. This flares with antibiotics, which he got for infected sebaceous cyst on his back last month, from the dermatologist. He is using lotrimin periodically, not regularly.  He was hospitalized twice in August with acute cholecystitis and sepsis (culture grew strep gallolyticus). He had cholecystotomy, and ultimately underwent cholecystectomy in 12/2020. He was advised to undergo colonoscopy due to having strep gallolyticus.  He had negative Cologuard in 07/2020, and recently saw Dr. Collene Mares, who did not feel that colonoscopy was needed.  Diabetes:  He continues to take onglyza, pioglitazone, and Tresiba (20 units).   Nephro has recommended starting farxiga and stopping pioglitazone when stable after cholecystectomy.  Sugars are running: 70-128 fasting in the last month. 130-160 when checked in the evening, 195 on Christmas day. Denies hypoglycemia, polydipsia; + polyuria/urinary urgency which is chronic and unchanged (has nocturia 3-4x/night). He is on diuretics. Denies numbness, tingling, lesions/sores, and checks his feet regularly. He no longer sees a podiatrist.  He finds it hard to cut his thick nails. Last diabetic eye exam was in 04/2020 Lab Results  Component Value Date   HGBA1C 6.2 (H) 01/12/2021     Hypertension and CKD stage 3, under the care of nephro:  Losartan and HCTZ were stopped during  hospitalization due to AKI.  He is now on amlodipine 9m daily. He has been having edema, and has been maintained on lasix 473malternating with 2055my nephro. As the swelling improved, he switched to just taking 21m72mily.  He denies any swelling.  His creatinine had improved to 1.53, and per nephro notes, the plan is to possibly restart ARB at his next f/u visit. It was mentioned that pioglitazone may be contributing to edema (as is amlodipine). He had labs checked by nephro in 01/2021, Hgb 9.9, Cr 1.53, urine microalb. He hasn't been checking BP at home recently. Denies headaches, dizziness, chest pain, palpitations, shortness of breath.  Edema has improved.   Prostate cancer: diagnosed in 04/2018.  He is s/p radiation treatments. He was treated with q 6 month Lupron injections for 2 years, stopped in 07/2020.  Last PSA was 0.033 in 01/2021.  Testosterone level was 289.5 (no longer suppressed, off Lupron).  He was noted to have low B12 in 01/2019. Last B12 level was improved, when taking MVI (not taking separate B12). He is on high dose PPI for GERD from Dr. MannCollene Mares states the reflux is well controlled.  He no longer coughs, but does not some drainage at night, throat-clearing at night.  Lab Results  Component Value Date   [id number] 03521/2022   Hyperlipidemia: he is compliant with taking simvastatin, denies side effects. Lipids were at goal on last check Lab Results  Component Value Date   CHOL 104 08/19/2020   HDL 39 (L) 08/19/2020   LDLCALC 49 08/19/2020   TRIG  77 08/19/2020   CHOLHDL 2.7 08/19/2020    Immunization History  Administered Date(s) Administered   DTaP 07/25/2008   Fluad Quad(high Dose 65+) 02/21/2020   Influenza Split 11/17/2011, 12/30/2013, 12/26/2014   Influenza, High Dose Seasonal PF 01/30/2013, 11/12/2015, 12/08/2016, 01/16/2018, 12/25/2018   Influenza-Unspecified 02/12/2021   Moderna SARS-COV2 Booster Vaccination 02/21/2020, 08/21/2020   Moderna  Sars-Covid-2 Vaccination 05/15/2019, 06/12/2019   Pneumococcal Conjugate-13 03/04/2014   Pneumococcal Polysaccharide-23 11/29/2009, 04/09/2015   Tdap 07/25/2008   Zoster, Live 09/05/2013   Last colonoscopy: 05/05/10. Negative Cologuard in 07/2020. Last PSA: now being monitored by urologist. 0.033 in 01/2021 Dentist: goes 2-3 times per year.  Ophtho: yearly (last 04/2020) Exercise: 30 minutes daily.  Walking 0.4 miles ,and is working his way back up since surgery, and doing home exercises. Hasn't started back with weights.   Patient Care Team: [name], MD as PCP - General (Family Medicine) Juanita Craver, MD as Consulting Physician (Gastroenterology) Cira Rue, RN Nurse Navigator as Registered Nurse (Medical Oncology) Tania Ade, Tomasa Blase, RN as [place] Management Constance Haw, Lanell Matar, MD as Consulting Physician (General Surgery) Dr. Augustin Schooling DDS as Consulting Physician (Dentistry) Marygrace Drought, MD as Consulting Physician (Ophthalmology) Donato Heinz, MD as Consulting Physician (Nephrology) Troy Sine, MD as Consulting Physician (Cardiology) [place], Jennefer Bravo, MD as Referring Physician (Dermatology) Alexis Frock, MD as Consulting Physician (Urology) Tyler Pita, MD as Consulting Physician (Radiation Oncology) Nicholes Stairs, MD as Consulting Physician (Orthopedic Surgery) Pedro Earls, MD as Attending Physician (Family Medicine)  Dentist: Dr. Johnnye Sima Ortho: Dr. Victorino December (also saw Dr. Delilah Shan for knee pain, at [facility])   Depression Screening: [place] Office Visit from 03/09/2021 in [place]  PHQ-2 Total Score 0        Falls screen:  Fall Risk  03/09/2021 12/29/2020 12/05/2020 11/25/2020 11/11/2020  Falls in the past year? 0 0 0 0 0  Number falls in past yr: 0 0 - - -  Injury with Fall? 0 0 - - -  Risk for fall due to : No Fall Risks Medication side effect - - -  Follow up Falls  evaluation completed Falls evaluation completed Falls evaluation completed Falls evaluation completed Falls evaluation completed     Functional Status Survey: Is the patient deaf or have difficulty hearing?: No Does the patient have difficulty seeing, even when wearing glasses/contacts?: No Does the patient have difficulty concentrating, remembering, or making decisions?: Yes (some slight memory issues) Does the patient have difficulty walking or climbing stairs?: No Does the patient have difficulty dressing or bathing?: No Does the patient have difficulty doing errands alone such as visiting a doctor's office or shopping?: No  No major memory concerns (slower recall with Jeopardy).  Mini-Cog Scoring: 5     End of Life Discussion:  Patient has filled out his living will, needs to get it notarized   PMH, PSH, SH and FH reviewed and updated.  Outpatient Encounter Medications as of 03/09/2021  Medication Sig Note   ACCU-CHEK GUIDE test strip USE AS DIRECTED    amLODipine (NORVASC) 10 MG tablet Take 1 tablet (10 mg total) by mouth daily.    aspirin 81 MG tablet Take 81 mg by mouth daily.    clotrimazole (LOTRIMIN) 1 % cream Apply 1 application topically 2 (two) times daily as needed (jock itch). 03/09/2021: Used last night   furosemide (LASIX) 20 MG tablet Take 1 tablet (20 mg total) by mouth daily. (Patient taking differently:  Take 20-40 mg by mouth See admin instructions.) 03/09/2021: Taking 38m daily   ketoconazole (NIZORAL) 2 % shampoo Apply 1 application topically 2 (two) times a week.    Multiple Vitamins-Minerals (MULTIVITAMIN WITH MINERALS) tablet Take 1 tablet by mouth daily. 03/09/2021: Centrum Silver Men's 50+   Omega-3 Fatty Acids (FISH OIL PO) Take 1,400 mg by mouth daily.    omeprazole (PRILOSEC) 40 MG capsule Take 1 capsule (40 mg total) by mouth in the morning and at bedtime.    oxybutynin (DITROPAN) 5 MG tablet Take 5 mg by mouth daily.    pioglitazone (ACTOS) 45 MG tablet TAKE  1 TABLET(45 MG) BY MOUTH DAILY    saxagliptin HCl (ONGLYZA) 2.5 MG TABS tablet TAKE 1 TABLET(2.5 MG) BY MOUTH DAILY (Patient taking differently: Take 2.5 mg by mouth daily.)    simvastatin (ZOCOR) 20 MG tablet TAKE 1 TABLET(20 MG) BY MOUTH AT BEDTIME    tamsulosin (FLOMAX) 0.4 MG CAPS capsule Take 1 capsule (0.4 mg total) by mouth 2 (two) times daily after a meal. For urinary urgency or weak stream. (Patient taking differently: Take 0.4 mg by mouth. For urinary urgency or weak stream.)    TRESIBA FLEXTOUCH 100 UNIT/ML FlexTouch Pen ADMINISTER 18 UNITS UNDER THE SKIN AT BEDTIME (Patient taking differently: Inject 20 Units into the skin at bedtime. ADMINISTER 18 UNITS UNDER THE SKIN AT BEDTIME)    [DISCONTINUED] ondansetron (ZOFRAN) 4 MG tablet Take 1 tablet (4 mg total) by mouth every 8 (eight) hours as needed.    [DISCONTINUED] oxyCODONE (ROXICODONE) 5 MG immediate release tablet Take 1 tablet (5 mg total) by mouth every 4 (four) hours as needed for severe pain or breakthrough pain.    No facility-administered encounter medications on file as of 03/09/2021.   Allergies  Allergen Reactions   Ace Inhibitors Cough   Statins Cough    ROS: The patient denies anorexia, fever, headaches, vision loss, decreased hearing, ear pain, hoarseness, chest pain, palpitations, dizziness, syncope, dyspnea on exertion, cough, swelling (improved), nausea, vomiting, diarrhea, constipation, abdominal pain, melena, hematochezia, hematuria, incontinence, weakened urine stream, dysuria, genital lesions, numbness, tingling, weakness, tremor, depression, anxiety, abnormal bleeding/bruising, or enlarged lymph nodes.    Up 3-4 times/night to void, and every 2-3 hours during the day. This is tolerable, and declined further treatment (from urology). +ED--sildenafil helps some, but currently having libido issues. Seasonal allergies, not flaring now. GERD is controlled with medication R knee pain, hurts to run and feels weaker on  the stairs.  No pain with walking   PHYSICAL EXAM:  BP (!) 160/80    Pulse 72    Ht _0  (1.905 m)    Wt 255 lb 12.8 oz (116 kg)    BMI 31.97 kg/m   RA: 1621systolic, heard pulse all the way down, couldn't assess diastolic LA: 1308systolic; got much softer at 50 (though could still here it).  Wt Readings from Last 3 Encounters:  03/09/21 255 lb 12.8 oz (116 kg)  01/20/21 248 lb (112.5 kg)  01/12/21 250 lb (113.4 kg)  01/2020 Wt 270# 12.8 oz  General Appearance:    Alert, cooperative, no distress, appears stated age     Head:    Normocephalic, without obvious abnormality, atraumatic     Eyes:    PERRL, conjunctiva/corneas clear, EOM's intact, fundi benign     Ears:    Normal TM's and external ear canals  Nose:    Not examined, wearing mask due to COVID-19 pandemic  Throat:    Not examined, wearing mask due to COVID-19 pandemic  Neck:    Supple, no lymphadenopathy; thyroid: no enlargement/ tenderness/nodules; no carotid bruit or JVD     Back:    Spine nontender, no curvature, ROM normal, no CVA tenderness   Lungs:    Clear to auscultation bilaterally without wheezes, rales or ronchi; respirations unlabored     Chest Wall:    No tenderness or deformity     Heart:    Regular rate and rhythm, S1 and S2 normal, no murmur, rub or gallop     Breast Exam:    R nipple--slightly more fibroglandular change at 9 o'clock, slightly firmer and more tender than the L nipple. No axillary lymphadenopathy. Nontender and no masses on L.     Abdomen:    Soft, non-tender, nondistended, normoactive bowel sounds, no masses, no hepatosplenomegaly. WHSS.  Genitalia:    Deferred to urologist     Rectal:    Deferred to urologist    Extremities:    No clubbing, cyanosis. There is trace to 1+ edema, symmetric.   Pulses:    2+ and symmetric all extremities     Skin:    Skin is dry/flaky, especially on lower legs; no rashes or lesions on legs/feet.  He does have a slight fissured area at groin (upper inner  thigh skin fold), with some slight hyperpigmentation towards the buttocks.  No erythema or flaking currently.  There is a scabbed/healing area on the upper back (site of previous sebaceous cyst, which was inflamed and lanced by dermatologist last month per pt). He had decreased monofilament sensation at base of R 1st metatarsal, and on some toes on the L foot. Normal sensation at heels bilaterally.  Thickened, long, onychomycotic toenails.   Lymph nodes:    Cervical, supraclavicular nodes normal     Neurologic:    Normal strength, sensation and gait; reflexes 2+ and symmetric throughout                           Psych:   Normal mood, affect, hygiene and grooming         ASSESSMENT/PLAN:  Annual physical exam  Medicare annual wellness visit, subsequent  CKD stage 3 due to type 2 diabetes mellitus ([place])  Essential hypertension, benign - elevated today; monitored/treated by nephro, who has plans to poss restart ARB at f/u if Cr stable. Reviewed low Na diet, exercise, wt loss  Controlled type 2 diabetes mellitus with stage 3 chronic kidney disease, with long-term current use of insulin (HCC) - Agree with nephro rec to switch from pioglitazone to farxiga.  Risks/SE of farxiga reviewed. To cont to monitor glu  Aortic atherosclerosis (HCC) - continue statin  Secondary hyperparathyroidism of renal origin ([place]) - monitored by nephro  Gastroesophageal reflux disease without esophagitis - controlled on PPI from GI.  Discussed risks, vitamin absorption, cont MVI. Lowest effective dose rec. Weight loss rec  Type 2 diabetes with nephropathy ([place]) - Improved control; cont current meds, and dose of 18U tresiba. Periodically check glu in evenings. Diet/exercise reviewed - Plan: saxagliptin HCl (ONGLYZA) 2.5 MG TABS tablet, Ambulatory referral to Podiatry, dapagliflozin propanediol (FARXIGA) 10 MG TABS tablet  Neuropathy - diabetic.  Refer to podiatrist for routine foot care. - Plan: Ambulatory referral  to Podiatry  Nipple pain - right; will check mammo/US. Has been off lupron for 6 months. - Plan: MM DIAG BREAST TOMO BILATERAL, US BREAST LTD UNI  RIGHT INC AXILLA  Class 1 obesity due to excess calories with serious comorbidity and body mass index (BMI) of 31.0 to 31.9 in adult - counseled on diet, exercise, wt loss.  Edema contributes.  Med changes today may help  Personal history of prostate cancer - PSA looked good at first check since off hormone suppression. Under care of urology.  Did not see active e/o yeast infection, just residual. Encouraged to keep the area dry, and if recurs, to use lotrimin or lamisil BID regularly for a week. Did review potential increased risk for yeast and UTI's related to starting Farxiga, and will need to watch closely for complications.  No indication for rx antifungal at this time.   Recommended at least 30 minutes of aerobic activity at least 5 days/week, weight-bearing exercise at least 2x/week; proper sunscreen use reviewed; healthy diet and alcohol recommendations (less than or equal to 2 drinks/day) reviewed; regular seatbelt use; changing batteries in smoke detectors. Immunization recommendations discussed--Continue yearly flu shots. Bivalent COVID vaccine recommended (declined today). TdaP past due, to get from pharmacy. Shingrix recommended, to get at pharmacy; risks/side effects reviewed.   Colon cancer screening is UTD, had Cologuard in 07/2020 and recent discussion with Dr. Collene Mares    MOST form updated and reviewed.  Full Code, Full care   To get living will and healthcare power of attorney forms notarized, then give [place] copy to scan into chart.  Counseled re: diet, exercise, risks of PPI's. Counseled re: risks/SE of farxiga.  F/u 2-3 months, A1c at visit. Pt to bring list of sugars. F/u 6 months with labs prior--A1c, c-met, cbc, lipid, TSH, B12  Medicare Attestation I have personally reviewed: The patient's medical and social history Their use  of alcohol, tobacco or illicit drugs Their current medications and supplements The patient's functional ability including ADLs,fall risks, home safety risks, cognitive, and hearing and visual impairment Diet and physical activities Evidence for depression or mood disorders  The patient's weight, height, BMI have been recorded in the chart.  I have made referrals, counseling, and provided education to the patient based on review of the above and I have provided the patient with a written personalized care plan for preventive services.    FTF time with patient 75 minutes. Total time spent (reviewing chart prior to visit (including records from urologist, GI and nephrologist), referrals, documentation, entering orders) was 90 mins.

## 2021-03-08 NOTE — Patient Instructions (Addendum)
HEALTH MAINTENANCE RECOMMENDATIONS:  It is recommended that you get at least 30 minutes of aerobic exercise at least 5 days/week (for weight loss, you may need as much as 60-90 minutes). This can be any activity that gets your heart rate up. This can be divided in 10-15 minute intervals if needed, but try and build up your endurance at least once a week.  Weight bearing exercise is also recommended twice weekly.  Eat a healthy diet with lots of vegetables, fruits and fiber.  "Colorful" foods have a lot of vitamins (ie green vegetables, tomatoes, red peppers, etc).  Limit sweet tea, regular sodas and alcoholic beverages, all of which has a lot of calories and sugar.  Up to 2 alcoholic drinks daily may be beneficial for men (unless trying to lose weight, watch sugars).  Drink a lot of water.  Sunscreen of at least SPF 30 should be used on all sun-exposed parts of the skin when outside between the hours of 10 am and 4 pm (not just when at beach or pool, but even with exercise, golf, tennis, and yard work!)  Use a sunscreen that says "broad spectrum" so it covers both UVA and UVB rays, and make sure to reapply every 1-2 hours.  Remember to change the batteries in your smoke detectors when changing your clock times in the spring and fall.  Carbon monoxide detectors are recommended for your home.  Use your seat belt every time you are in a car, and please drive safely and not be distracted with cell phones and texting while driving.    James Glover , Thank you for taking time to come for your Medicare Wellness Visit. I appreciate your ongoing commitment to your health goals. Please review the following plan we discussed and let me know if I can assist you in the future.   This is a list of the screening recommended for you and due dates:  Health Maintenance  Topic Date Due   Zoster (Shingles) Vaccine (1 of 2) Never done   Tetanus Vaccine  07/26/2018   COVID-19 Vaccine (3 - Moderna risk series)  09/18/2020   Urine Protein Check  02/12/2021   Complete foot exam   02/20/2021   Eye exam for diabetics  04/29/2021   Hemoglobin A1C  07/12/2021   Cologuard (Stool DNA test)  08/01/2023   Pneumonia Vaccine  Completed   Flu Shot  Completed   Hepatitis C Screening: USPSTF Recommendation to screen - Ages 18-79 yo.  Completed   HPV Vaccine  Aged Out   Colon Cancer Screening  Discontinued   Bivalent COVID booster is recommended. Please get TdaP (tetanus booster--you are past due), and Shingrix vaccines from the pharmacy. These should be separated from other vaccines by at least 2 weeks.  Your foot exam was performed today, and your urine protein check was done last month by your nephrologist.  Please get [place] copies of your living will and healthcare power of attorney forms, to be scanned into your chart, once notarized.  Stop the pioglitazone and start Farxiga. Continue to monitor sugars regularly. Side effects of the [place] can include urinary tract infections, yeast infections. Please let us know if you're having problems.  Your blood pressure was elevated today. Please be sure to limit the salt and sodium in your diet (limited canned foods, soups). Periodically check your blood pressure and record. Goal is under 130/80. I believe your kidney doctor plans to make some adjustments to your blood pressure medications at your  next visit (possibly restarting losartan). Bring your list of blood pressures to visit with me, as well as to those with Dr. Arty Baumgartner.  Continue to use lotrimin or lamisil twice daily until the rash/itching is resolved (usually twice daily for at least a week). Keeping the area dry is crucial in letting it heal.  We will be setting up a mammogram and ultrasound to evaluate your right nipple pain. We are referring you to the podiatrist to help with your toenail care.  Be sure to check your feet regularly, as you do have numbness on the bottom of your feet.

## 2021-03-09 ENCOUNTER — Other Ambulatory Visit: Payer: Self-pay

## 2021-03-09 ENCOUNTER — Encounter: Payer: Self-pay | Admitting: Family Medicine

## 2021-03-09 ENCOUNTER — Encounter: Payer: Self-pay | Admitting: *Deleted

## 2021-03-09 ENCOUNTER — Ambulatory Visit: Payer: Medicare PPO | Admitting: Family Medicine

## 2021-03-09 VITALS — BP 160/80 | HR 72 | Ht 75.0 in | Wt 255.8 lb

## 2021-03-09 DIAGNOSIS — E1122 Type 2 diabetes mellitus with diabetic chronic kidney disease: Secondary | ICD-10-CM

## 2021-03-09 DIAGNOSIS — E6609 Other obesity due to excess calories: Secondary | ICD-10-CM | POA: Diagnosis not present

## 2021-03-09 DIAGNOSIS — Z8546 Personal history of malignant neoplasm of prostate: Secondary | ICD-10-CM

## 2021-03-09 DIAGNOSIS — G629 Polyneuropathy, unspecified: Secondary | ICD-10-CM

## 2021-03-09 DIAGNOSIS — D519 Vitamin B12 deficiency anemia, unspecified: Secondary | ICD-10-CM

## 2021-03-09 DIAGNOSIS — Z0000 Encounter for general adult medical examination without abnormal findings: Secondary | ICD-10-CM

## 2021-03-09 DIAGNOSIS — N644 Mastodynia: Secondary | ICD-10-CM | POA: Diagnosis not present

## 2021-03-09 DIAGNOSIS — N2581 Secondary hyperparathyroidism of renal origin: Secondary | ICD-10-CM | POA: Diagnosis not present

## 2021-03-09 DIAGNOSIS — E1121 Type 2 diabetes mellitus with diabetic nephropathy: Secondary | ICD-10-CM

## 2021-03-09 DIAGNOSIS — I10 Essential (primary) hypertension: Secondary | ICD-10-CM | POA: Diagnosis not present

## 2021-03-09 DIAGNOSIS — I700 Atherosclerosis of aorta: Secondary | ICD-10-CM | POA: Diagnosis not present

## 2021-03-09 DIAGNOSIS — Z6831 Body mass index (BMI) 31.0-31.9, adult: Secondary | ICD-10-CM

## 2021-03-09 DIAGNOSIS — N183 Chronic kidney disease, stage 3 unspecified: Secondary | ICD-10-CM

## 2021-03-09 DIAGNOSIS — K219 Gastro-esophageal reflux disease without esophagitis: Secondary | ICD-10-CM

## 2021-03-09 DIAGNOSIS — Z5181 Encounter for therapeutic drug level monitoring: Secondary | ICD-10-CM

## 2021-03-09 DIAGNOSIS — Z794 Long term (current) use of insulin: Secondary | ICD-10-CM

## 2021-03-09 MED ORDER — DAPAGLIFLOZIN PROPANEDIOL 10 MG PO TABS: 10.0000 mg | ORAL_TABLET | Freq: Every day | ORAL | 2 refills | Status: DC

## 2021-03-09 MED ORDER — ONGLYZA 2.5 MG PO TABS: ORAL_TABLET | ORAL | 1 refills | Status: DC

## 2021-03-10 DIAGNOSIS — Z8546 Personal history of malignant neoplasm of prostate: Secondary | ICD-10-CM | POA: Insufficient documentation

## 2021-03-11 ENCOUNTER — Other Ambulatory Visit: Payer: Self-pay

## 2021-03-11 NOTE — Patient Outreach (Signed)
[facility]) Care Management  03/11/2021  JESHAWN MELUCCI [birth year] [id number]   Telephone Assessment     Unsuccessful outreach attempt to patient.   Plan: RN CM will make outreach attempt to patient within the month of March.   Enzo Montgomery, RN,BSN,CCM [facility] Telephonic Care Management Coordinator Direct Phone: [phone] Toll Free: [phone] Fax: [phone]

## 2021-03-13 ENCOUNTER — Ambulatory Visit: Payer: Self-pay

## 2021-03-18 ENCOUNTER — Ambulatory Visit: Payer: Medicare PPO | Admitting: Podiatry

## 2021-03-18 ENCOUNTER — Other Ambulatory Visit: Payer: Self-pay

## 2021-03-18 DIAGNOSIS — Z794 Long term (current) use of insulin: Secondary | ICD-10-CM

## 2021-03-18 DIAGNOSIS — N183 Chronic kidney disease, stage 3 unspecified: Secondary | ICD-10-CM | POA: Diagnosis not present

## 2021-03-18 DIAGNOSIS — B351 Tinea unguium: Secondary | ICD-10-CM | POA: Diagnosis not present

## 2021-03-18 DIAGNOSIS — R634 Abnormal weight loss: Secondary | ICD-10-CM | POA: Insufficient documentation

## 2021-03-18 DIAGNOSIS — E1122 Type 2 diabetes mellitus with diabetic chronic kidney disease: Secondary | ICD-10-CM | POA: Diagnosis not present

## 2021-03-18 DIAGNOSIS — M79675 Pain in left toe(s): Secondary | ICD-10-CM | POA: Diagnosis not present

## 2021-03-18 DIAGNOSIS — M79674 Pain in right toe(s): Secondary | ICD-10-CM

## 2021-03-18 NOTE — Progress Notes (Signed)
°  Subjective:  Patient ID: James Glover, male    DOB: 1945-12-02,   MRN: [id number]  Chief Complaint  Patient presents with   Nail Problem    Pt is here for [facility] las A1C checked in June 6.9 FBS was 48    76 y.o. male presents for concern of thickened elongated and painful nails that are difficult to trim. Requesting to have them trimmed today. Denies any burning or tingling in her feet. Patient is diabetic and last A1c was 6.9.   . Denies any other pedal complaints. Denies n/v/f/c.   PCP: [name] MD   Past Medical History:  Diagnosis Date   CKD (chronic kidney disease), stage III [facility])    nephrologist-- [facility]--- per lov note 03-08-2018 in epic, stable (baseline Cr 1.5 - 2)   Dyslipidemia    Erectile dysfunction    First degree heart block    GERD (gastroesophageal reflux disease)    Hemorrhoids    internal and external   History of nuclear stress test    05-26-2009 (by dr Rollene Fare due to HTN and DM2)--- normal study w/ no ischemia,  normal LV function and wall motion , ef 70%   Hyperplasia of prostate with lower urinary tract symptoms (LUTS)    Hypertension    Hypogonadism male    Iron deficiency    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 Tresa Moore   Prostate cancer [facility]) urologist-  dr t. Tresa Moore  oncologsit-  dr Tammi Klippel   dx 05-30-2018-- Stage T1c, Gleason 4+5, High Risk   Renal cyst, left    followed by dr t. Tresa Moore--  chronic noncomplex   Type 2 diabetes mellitus ([place])    followed by pcp   Wears partial dentures    upper    Objective:  Physical Exam: Vascular: DP/PT pulses 2/4 bilateral. CFT <3 seconds. Normal hair growth on digits. No edema.  Skin. No lacerations or abrasions bilateral feet. Nails 1-5 are thickened discolored and elongated with subungual debris.  Musculoskeletal: MMT 5/5 bilateral lower extremities in DF, PF, Inversion and Eversion. Deceased ROM in DF of ankle joint.  Neurological: Sensation intact to light touch.    Assessment:   1. Controlled type 2 diabetes mellitus with stage 3 chronic kidney disease, with long-term current use of insulin (HCC)   2. Pain due to onychomycosis of toenails of both feet      Plan:  Patient was evaluated and treated and all questions answered. -Discussed and educated patient on diabetic foot care, especially with  regards to the vascular, neurological and musculoskeletal systems.  -Stressed the importance of good glycemic control and the detriment of not  controlling glucose levels in relation to the foot. -Discussed supportive shoes at all times and checking feet regularly.  -Mechanically debrided all nails 1-5 bilateral using sterile nail nipper and filed with dremel without incident  -Answered all patient questions -Patient to return  in 3 months for at risk foot care -Patient advised to call the office if any problems or questions arise in the meantime.   [name], DPM

## 2021-04-10 ENCOUNTER — Other Ambulatory Visit: Payer: Medicare PPO

## 2021-04-12 ENCOUNTER — Other Ambulatory Visit: Payer: Self-pay | Admitting: Family Medicine

## 2021-04-12 DIAGNOSIS — E1122 Type 2 diabetes mellitus with diabetic chronic kidney disease: Secondary | ICD-10-CM

## 2021-04-12 DIAGNOSIS — N183 Chronic kidney disease, stage 3 unspecified: Secondary | ICD-10-CM

## 2021-04-18 ENCOUNTER — Other Ambulatory Visit: Payer: Self-pay | Admitting: Family Medicine

## 2021-04-18 DIAGNOSIS — N183 Chronic kidney disease, stage 3 unspecified: Secondary | ICD-10-CM

## 2021-04-18 DIAGNOSIS — E1122 Type 2 diabetes mellitus with diabetic chronic kidney disease: Secondary | ICD-10-CM

## 2021-04-27 ENCOUNTER — Other Ambulatory Visit: Payer: Self-pay | Admitting: Family Medicine

## 2021-04-28 DIAGNOSIS — L601 Onycholysis: Secondary | ICD-10-CM | POA: Diagnosis not present

## 2021-04-28 DIAGNOSIS — L304 Erythema intertrigo: Secondary | ICD-10-CM | POA: Diagnosis not present

## 2021-04-28 DIAGNOSIS — D044 Carcinoma in situ of skin of scalp and neck: Secondary | ICD-10-CM | POA: Diagnosis not present

## 2021-04-28 DIAGNOSIS — Z23 Encounter for immunization: Secondary | ICD-10-CM | POA: Diagnosis not present

## 2021-04-28 DIAGNOSIS — L570 Actinic keratosis: Secondary | ICD-10-CM | POA: Diagnosis not present

## 2021-04-28 DIAGNOSIS — L723 Sebaceous cyst: Secondary | ICD-10-CM | POA: Diagnosis not present

## 2021-04-28 DIAGNOSIS — L219 Seborrheic dermatitis, unspecified: Secondary | ICD-10-CM | POA: Diagnosis not present

## 2021-04-28 DIAGNOSIS — D485 Neoplasm of uncertain behavior of skin: Secondary | ICD-10-CM | POA: Diagnosis not present

## 2021-05-04 DIAGNOSIS — H2513 Age-related nuclear cataract, bilateral: Secondary | ICD-10-CM | POA: Diagnosis not present

## 2021-05-04 DIAGNOSIS — E119 Type 2 diabetes mellitus without complications: Secondary | ICD-10-CM | POA: Diagnosis not present

## 2021-05-04 DIAGNOSIS — H52203 Unspecified astigmatism, bilateral: Secondary | ICD-10-CM | POA: Diagnosis not present

## 2021-05-04 DIAGNOSIS — H5203 Hypermetropia, bilateral: Secondary | ICD-10-CM | POA: Diagnosis not present

## 2021-05-04 LAB — HM DIABETES EYE EXAM

## 2021-05-06 ENCOUNTER — Other Ambulatory Visit: Payer: Self-pay

## 2021-05-06 NOTE — Patient Outreach (Signed)
[place] [facility]) Care Management ? ?05/06/2021 ? ?[name] ?1946-01-21 ?[id number] ? ? ?Telephone Assessment ? ? ? ?Successful outreach call paled to patient. He reports he has been doing well the past few moths. He continues to have ongoing bladder issues(freq urination). Appetite remains good. Wgt stable at 242 lbs. He continues to be active and walks half a mile on track daily. No recent falls. Denies any RN CM needs or concerns at this time. ? ? ?Medications Reviewed Today   ? ? Reviewed by Hayden Pedro, RN (Registered Nurse) on 05/06/21 at [phone]  Med List Status: <None>  ? ?Medication Order Taking? Sig Documenting Provider Last Dose Status Informant  ?ACCU-CHEK GUIDE test strip [id number] No USE AS DIRECTED [name], MD Taking Active Spouse/Significant Other  ?amLODIPine (KATERZIA) 1 mg/mL SUSP oral suspension [id number]  Amlodipine [provider]  Active   ?amLODipine (NORVASC) 10 MG tablet [id number]  TAKE 1 TABLET(10 MG) BY MOUTH DAILY [name], MD  Active   ?Aspirin 81 MG CAPS [id number]  Aspirin 81 mg [provider]  Active   ?aspirin 81 MG tablet [id number] No Take 81 mg by mouth daily. [provider] Taking Active Spouse/Significant Other  ?clotrimazole (LOTRIMIN) 1 % cream [id number] No Apply 1 application topically 2 (two) times daily as needed (jock itch). [provider] Taking Active Spouse/Significant Other  ?         ?Med Note Georgiana Spinner Mar 09, 2021  1:38 PM) Used last night  ?dapagliflozin propanediol (FARXIGA) 10 MG TABS tablet [id number]  Take 1 tablet (10 mg total) by mouth daily before breakfast. [name], MD  Active   ?furosemide (LASIX) 20 MG tablet [id number] No Take 1 tablet (20 mg total) by mouth daily.  ?Patient taking differently: Take 20-40 mg by mouth See admin instructions.  ? Nita Sells, MD Taking Active Spouse/Significant Other  ?         ?Med Note Georgiana Spinner Mar 09, 2021  1:39  PM) Taking '20mg'$  daily  ?insulin degludec (TRESIBA FLEXTOUCH) 100 UNIT/ML FlexTouch Pen [id number]  Inject 20 Units into the skin at bedtime. ADMINISTER 18 UNITS UNDER THE SKIN AT BEDTIME [name], MD  Active   ?ketoconazole (NIZORAL) 2 % shampoo [id number] No Apply 1 application topically 2 (two) times a week. [provider] Taking Active   ?Multiple Vitamins-Minerals (MULTIVITAMIN WITH MINERALS) tablet [id number] No Take 1 tablet by mouth daily. [provider] Taking Active Spouse/Significant Other  ?         ?Med Note Georgiana Spinner Mar 09, 2021  1:39 PM) [place] [facility] Men's 50+  ?Omega-3 Fatty Acids (FISH OIL PO) [id number] No Take 1,400 mg by mouth daily. [provider] Taking Active Spouse/Significant Other  ?omeprazole (PRILOSEC) 40 MG capsule [id number] No Take 1 capsule (40 mg total) by mouth in the morning and at bedtime. [name], MD Taking Active Spouse/Significant Other  ?omeprazole (PRILOSEC) 40 MG capsule [id number]  1 cap(s) orally 20 minutes before breakfast [provider]  Active   ?oxybutynin (DITROPAN) 5 MG tablet [id number] No Take 5 mg by mouth daily. [provider] Taking Active Spouse/Significant Other  ?saxagliptin HCl (ONGLYZA) 2.5 MG TABS tablet [id number]  TAKE 1 TABLET(2.5 MG) BY MOUTH DAILY [name], MD  Active   ?simvastatin (ZOCOR) 20 MG tablet [id number] No TAKE 1 TABLET(20 MG) BY MOUTH AT BEDTIME [name], MD Taking Active Spouse/Significant  Other  ?tamsulosin (FLOMAX) 0.4 MG CAPS capsule [id number] No Take 1 capsule (0.4 mg total) by mouth 2 (two) times daily after a meal. For urinary urgency or weak stream.  ?Patient taking differently: Take 0.4 mg by mouth. For urinary urgency or weak stream.  ? Bruning, Ashlyn, PA-C Taking Active   ?         ?Med Note Ardine Eng Oct 15, 2020  9:03 PM)    ?valACYclovir (VALTREX) 1000 MG tablet [id number]  valacyclovir 1 gram tablet ? TAKE 2 TABLETS BY MOUTH NOW. REPEAT IN 12  HOURS ONCE [provider]  Active   ? ?  ?  ? ?  ? ? ? ? ? ? ? ?Plan: ?RN CM discussed with patient next outreach within the month of June.Patient agrees to care plan and follow up. ?RN CM will send quarterly update to PCP.  ? ?Enzo Montgomery, RN,BSN,CCM ?[facility] Care Management ?Telephonic Care Management Coordinator ?Direct Phone: [phone] ?Toll Free: [phone] ?Fax: [phone] ? ?

## 2021-05-26 DIAGNOSIS — N179 Acute kidney failure, unspecified: Secondary | ICD-10-CM | POA: Diagnosis not present

## 2021-05-26 DIAGNOSIS — E1122 Type 2 diabetes mellitus with diabetic chronic kidney disease: Secondary | ICD-10-CM | POA: Diagnosis not present

## 2021-05-26 DIAGNOSIS — N1831 Chronic kidney disease, stage 3a: Secondary | ICD-10-CM | POA: Diagnosis not present

## 2021-05-26 DIAGNOSIS — E611 Iron deficiency: Secondary | ICD-10-CM | POA: Diagnosis not present

## 2021-05-26 DIAGNOSIS — C61 Malignant neoplasm of prostate: Secondary | ICD-10-CM | POA: Diagnosis not present

## 2021-05-26 DIAGNOSIS — R600 Localized edema: Secondary | ICD-10-CM | POA: Diagnosis not present

## 2021-05-26 DIAGNOSIS — E785 Hyperlipidemia, unspecified: Secondary | ICD-10-CM | POA: Diagnosis not present

## 2021-05-26 DIAGNOSIS — N2581 Secondary hyperparathyroidism of renal origin: Secondary | ICD-10-CM | POA: Diagnosis not present

## 2021-05-26 DIAGNOSIS — I129 Hypertensive chronic kidney disease with stage 1 through stage 4 chronic kidney disease, or unspecified chronic kidney disease: Secondary | ICD-10-CM | POA: Diagnosis not present

## 2021-05-26 LAB — PROTEIN / CREATININE RATIO, URINE
Albumin, U: 9.2
Creatinine, Urine: 67.3

## 2021-05-26 LAB — MICROALBUMIN, URINE: Microalb, Ur: 14

## 2021-05-26 LAB — MICROALBUMIN / CREATININE URINE RATIO: Microalb Creat Ratio: 14

## 2021-05-27 NOTE — Progress Notes (Signed)
Chief Complaint  ?Patient presents with  ? Diabetes  ?  Nonfasting med check. Tolerating Farxiga well. Did not go for mammo as R nipple pain resolved. Nephro cut lasix to one daily. He got Tdap ans first shingrix. Does not want covid booster at this time. He states that people are telling him he looks pale. He wants to know if he is anemic.   ? ?His friends mentioned he looked pale and wonders if he could be anemic.  He denies any bleeding.  He had labs done recently by nephro, no results yet received here. ?He denies any dizziness, LH, shortness of breath or significant fatigue. He got a lot of exercise today. ? ?Patient presents for f/u on DM. His medications were changed at his physical in January.  We stopped the pioglitazone and switched to [place], to help with his CKD. ?He continues on  20 U of [place] and [place]. ?Sugars are running 72-140 in the mornings, mostly 90-125; in the evenings was 122 (ate bigger lunch, no dinner) up to 179.  A few highs, mostly 140-150. ? ?He had some issues related to possible yeast prior to starting Farxiga (related to antibiotics). He hasn't taken any more antibiotics.  Has had some slight issues with rash, but none currently. (Some red areas around the base of penis, testicle, not currently).  He uses antifungal as needed, and [place] powder. ? ?HTN:  BP was elevated at last visit.  This is treated by nephro.  We had reviewed low Na diet, goals. He saw Dr. Arty Baumgartner 2 days ago.  Lasix was decreased to 1 tablet daily.  He did labs yesterday. ?BP's have been running 120-153/59-80.  A few >315 systolic, most 176'H-607. ? ?BP Readings from Last 3 Encounters:  ?05/28/21 120/62  ?03/09/21 (!) 160/80  ?01/20/21 (!) 143/78  ? ?At his last visit he was complaining of right nipple pain. We ordered mammo/US, but this was not yet done. He states the nipple pain has completely resolved. ? ? ?PMH, PSH, SH reviewed ? ?Outpatient Encounter Medications as of 05/28/2021  ?Medication Sig Note   ? ACCU-CHEK GUIDE test strip USE AS DIRECTED   ? aspirin EC 81 MG tablet Take 81 mg by mouth daily. Swallow whole.   ? clotrimazole (LOTRIMIN) 1 % cream Apply 1 application topically 2 (two) times daily as needed (jock itch). 05/28/2021: Used this am  ? dapagliflozin propanediol (FARXIGA) 10 MG TABS tablet Take 1 tablet (10 mg total) by mouth daily before breakfast.   ? furosemide (LASIX) 20 MG tablet Take 20 mg by mouth.   ? insulin degludec (TRESIBA FLEXTOUCH) 100 UNIT/ML FlexTouch Pen Inject 20 Units into the skin at bedtime. ADMINISTER 18 UNITS UNDER THE SKIN AT BEDTIME 05/28/2021: Taking 20 units  ? Multiple Vitamins-Minerals (MULTIVITAMIN WITH MINERALS) tablet Take 1 tablet by mouth daily. 03/09/2021: Centrum Silver Men's 50+  ? Omega-3 Fatty Acids (FISH OIL PO) Take 1,400 mg by mouth daily.   ? omeprazole (PRILOSEC) 40 MG capsule Take 1 capsule (40 mg total) by mouth in the morning and at bedtime.   ? oxybutynin (DITROPAN) 5 MG tablet Take 5 mg by mouth daily.   ? saxagliptin HCl (ONGLYZA) 2.5 MG TABS tablet TAKE 1 TABLET(2.5 MG) BY MOUTH DAILY   ? simvastatin (ZOCOR) 20 MG tablet TAKE 1 TABLET(20 MG) BY MOUTH AT BEDTIME   ? tamsulosin (FLOMAX) 0.4 MG CAPS capsule Take 1 capsule (0.4 mg total) by mouth 2 (two) times daily after a meal.  For urinary urgency or weak stream. (Patient taking differently: Take 0.4 mg by mouth. For urinary urgency or weak stream.)   ? ketoconazole (NIZORAL) 2 % shampoo Apply 1 application topically 2 (two) times a week. (Patient not taking: Reported on 05/28/2021) 05/28/2021: Ran out today  ? valACYclovir (VALTREX) 1000 MG tablet valacyclovir 1 gram tablet ? TAKE 2 TABLETS BY MOUTH NOW. REPEAT IN 12 HOURS ONCE (Patient not taking: Reported on 05/28/2021)   ? [DISCONTINUED] amLODIPine (KATERZIA) 1 mg/mL SUSP oral suspension Amlodipine   ? [DISCONTINUED] amLODipine (NORVASC) 10 MG tablet TAKE 1 TABLET(10 MG) BY MOUTH DAILY   ? [DISCONTINUED] Aspirin 81 MG CAPS Aspirin 81 mg   ?  [DISCONTINUED] aspirin 81 MG tablet Take 81 mg by mouth daily.   ? [DISCONTINUED] furosemide (LASIX) 20 MG tablet Take 1 tablet (20 mg total) by mouth daily. (Patient taking differently: Take 20-40 mg by mouth See admin instructions.) 03/09/2021: Taking '20mg'$  daily  ? [DISCONTINUED] omeprazole (PRILOSEC) 40 MG capsule 1 cap(s) orally 20 minutes before breakfast   ? ?No facility-administered encounter medications on file as of 05/28/2021.  ? ?Allergies  ?Allergen Reactions  ? Ace Inhibitors Cough  ? Statins Cough  ? ? ?ROS: no fever, chills, headaches, dizziness, chest pain, shortness of breath. ?Denies depression, nausea, vomiting, bowel changes, vision changes, bleeding, bruising, rash. Intermittent redness around the penis and scrotum. ?+R knee pain, no other joint pains. ?Only intermittently seeing swelling in the ankles, no longer noticeable or bothersome. ?Moods are good. ?Appetite is normal. ?+weight loss. No changes to diet (just medications). ? ? ?PHYSICAL EXAM: ? ?BP 120/62   Pulse 64   Ht '6\' 3"'$  (1.905 m)   Wt 241 lb (109.3 kg)   BMI 30.12 kg/m?  ? ?Wt Readings from Last 3 Encounters:  ?05/28/21 241 lb (109.3 kg)  ?03/09/21 255 lb 12.8 oz (116 kg)  ?01/20/21 248 lb (112.5 kg)  ? ?Well-appearing, pleasant male, in no distress. Face appears thinner than previous, no significant pallor noted. ?HEENT: conjunctiva and sclera are clear, EOMI.  ?Neck: no lymphadenopathy or mass, no bruit. ?Heart: regular rate and rhythm ?Lungs: clear, no wheezes, rales, ronchi ?Back: no CVA tenderness or spinal tenderness ?Abdomen: soft, nontender, no mass ?Extremities: trace pretibial edema, symmetric. ?Neuro: alert and oriented, normal gait ?Psych: normal mood, affect, normal eye contact, grooming. ?Skin: no rashes, normal turgor.  Large purpura on the back of his R hand (mild trauma) ? ? ?Lab Results  ?Component Value Date  ? HGBA1C 6.8 (A) 05/28/2021  ? ? ?ASSESSMENT/PLAN: ? ?Controlled type 2 diabetes mellitus with stage 3  chronic kidney disease, with long-term current use of insulin (HCC) - A1c okay; +wt loss since on Farxiga. Cont current DM regimen. Check PP a little more frequently - Plan: HgB A1c, dapagliflozin propanediol (FARXIGA) 10 MG TABS tablet ? ?CKD stage 3 due to type 2 diabetes mellitus ([place]) - await recent nephro notes.  cont Wilder Glade ? ?Essential hypertension, benign - well controlled (some higher values at home, last few readings have been very good). Reviewed low Na diet ? ?Vaccine counseling - Encouraged COVID bivalent booster, all questions answered (pt hesitant) and discussed timing. ? ? ?Reviewed potential risks/side effects of Farxiga in detail again.  Sounds like some intermittent yeast issues.  Discussed cotton underwear, keeping things dry, use of powder. To f/u if persistent/worsening rash. ? ?Bivalent COVID booster recommended, timing discussed. To get 2nd Shingrix as planned. ? ?Weight loss--appetite is good, energy is good.  Suspect  some of the loss is related to starting [place].  Further loss is recommended, expect to see a plateau related to the medication. ? ? ? f/u as scheduled in July, with fasting labs prior to visit. ?

## 2021-05-28 ENCOUNTER — Encounter: Payer: Self-pay | Admitting: Family Medicine

## 2021-05-28 ENCOUNTER — Ambulatory Visit (INDEPENDENT_AMBULATORY_CARE_PROVIDER_SITE_OTHER): Payer: Medicare PPO | Admitting: Family Medicine

## 2021-05-28 VITALS — BP 120/62 | HR 64 | Ht 75.0 in | Wt 241.0 lb

## 2021-05-28 DIAGNOSIS — I10 Essential (primary) hypertension: Secondary | ICD-10-CM | POA: Diagnosis not present

## 2021-05-28 DIAGNOSIS — Z794 Long term (current) use of insulin: Secondary | ICD-10-CM | POA: Diagnosis not present

## 2021-05-28 DIAGNOSIS — E1122 Type 2 diabetes mellitus with diabetic chronic kidney disease: Secondary | ICD-10-CM

## 2021-05-28 DIAGNOSIS — N183 Chronic kidney disease, stage 3 unspecified: Secondary | ICD-10-CM | POA: Diagnosis not present

## 2021-05-28 DIAGNOSIS — Z7185 Encounter for immunization safety counseling: Secondary | ICD-10-CM | POA: Diagnosis not present

## 2021-05-28 LAB — POCT GLYCOSYLATED HEMOGLOBIN (HGB A1C): Hemoglobin A1C: 6.8 % — AB (ref 4.0–5.6)

## 2021-05-28 MED ORDER — DAPAGLIFLOZIN PROPANEDIOL 10 MG PO TABS: 10.0000 mg | ORAL_TABLET | Freq: Every day | ORAL | 1 refills | Status: DC

## 2021-05-28 NOTE — Patient Instructions (Addendum)
Continue to monitor your sugars--the morning sugars look great! ?Try and check 2 hours after eating at least 2x/week. Feel free to jot notes that might explain why it is higher or lower, and that way you might figure out patterns (ie what foods to avoid or eat smaller portions of). ? ?Don't forget to get your 2nd Shingrix in May. ? ?COVID booster (bivalent vaccine) is recommended.  You need to wait 2 weeks from your recent shingrix, and your next shingrix should be at least 2 weeks after the vaccine (spacing it between the shingles vaccine, and the 1 month mark, would be perfect). ?

## 2021-06-01 ENCOUNTER — Other Ambulatory Visit: Payer: Self-pay | Admitting: Family Medicine

## 2021-06-01 DIAGNOSIS — E1122 Type 2 diabetes mellitus with diabetic chronic kidney disease: Secondary | ICD-10-CM

## 2021-06-10 ENCOUNTER — Encounter: Payer: Self-pay | Admitting: Family Medicine

## 2021-06-17 DIAGNOSIS — N1831 Chronic kidney disease, stage 3a: Secondary | ICD-10-CM | POA: Diagnosis not present

## 2021-07-03 ENCOUNTER — Other Ambulatory Visit: Payer: Self-pay | Admitting: Family Medicine

## 2021-07-03 DIAGNOSIS — E785 Hyperlipidemia, unspecified: Secondary | ICD-10-CM

## 2021-07-14 DIAGNOSIS — Z79899 Other long term (current) drug therapy: Secondary | ICD-10-CM | POA: Diagnosis not present

## 2021-07-14 DIAGNOSIS — D225 Melanocytic nevi of trunk: Secondary | ICD-10-CM | POA: Diagnosis not present

## 2021-07-14 DIAGNOSIS — L578 Other skin changes due to chronic exposure to nonionizing radiation: Secondary | ICD-10-CM | POA: Diagnosis not present

## 2021-07-14 DIAGNOSIS — L821 Other seborrheic keratosis: Secondary | ICD-10-CM | POA: Diagnosis not present

## 2021-07-14 DIAGNOSIS — Z85828 Personal history of other malignant neoplasm of skin: Secondary | ICD-10-CM | POA: Diagnosis not present

## 2021-07-14 DIAGNOSIS — B359 Dermatophytosis, unspecified: Secondary | ICD-10-CM | POA: Diagnosis not present

## 2021-07-14 DIAGNOSIS — L570 Actinic keratosis: Secondary | ICD-10-CM | POA: Diagnosis not present

## 2021-07-14 DIAGNOSIS — L814 Other melanin hyperpigmentation: Secondary | ICD-10-CM | POA: Diagnosis not present

## 2021-07-20 ENCOUNTER — Ambulatory Visit: Payer: Medicare PPO | Admitting: Podiatry

## 2021-07-22 ENCOUNTER — Encounter: Payer: Self-pay | Admitting: Podiatry

## 2021-07-22 ENCOUNTER — Ambulatory Visit: Payer: Medicare PPO | Admitting: Podiatry

## 2021-07-22 DIAGNOSIS — M79675 Pain in left toe(s): Secondary | ICD-10-CM | POA: Diagnosis not present

## 2021-07-22 DIAGNOSIS — E1122 Type 2 diabetes mellitus with diabetic chronic kidney disease: Secondary | ICD-10-CM

## 2021-07-22 DIAGNOSIS — N183 Chronic kidney disease, stage 3 unspecified: Secondary | ICD-10-CM

## 2021-07-22 DIAGNOSIS — M79674 Pain in right toe(s): Secondary | ICD-10-CM

## 2021-07-22 DIAGNOSIS — B351 Tinea unguium: Secondary | ICD-10-CM | POA: Diagnosis not present

## 2021-07-22 DIAGNOSIS — Z794 Long term (current) use of insulin: Secondary | ICD-10-CM

## 2021-07-22 NOTE — Progress Notes (Signed)
  Subjective:  Patient ID: James Glover, male    DOB: [birth year],   MRN: [id number]  No chief complaint on file.   76 y.o. male presents for concern of thickened elongated and painful nails that are difficult to trim. Requesting to have them trimmed today. Denies any burning or tingling in her feet. Patient is diabetic and last A1c was 6.9.   . Denies any other pedal complaints. Denies n/v/f/c.   PCP: [name] MD   Past Medical History:  Diagnosis Date   CKD (chronic kidney disease), stage III 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 Tresa Moore   Prostate cancer [facility]) urologist-  dr t. Tresa Moore  oncologsit-  dr Tammi Klippel   dx 05-30-2018-- Stage T1c, Gleason 4+5, High Risk   Renal cyst, left    followed by dr t. Tresa Moore--  chronic noncomplex   Type 2 diabetes mellitus ([place])    followed by pcp   Wears partial dentures    upper    Objective:  Physical Exam: Vascular: DP/PT pulses 2/4 bilateral. CFT <3 seconds. Normal hair growth on digits. No edema.  Skin. No lacerations or abrasions bilateral feet. Nails 1-5 are thickened discolored and elongated with subungual debris.  Musculoskeletal: MMT 5/5 bilateral lower extremities in DF, PF, Inversion and Eversion. Deceased ROM in DF of ankle joint.  Neurological: Sensation intact to light touch.   Assessment:   1. Controlled type 2 diabetes mellitus with stage 3 chronic kidney  disease, with long-term current use of insulin (HCC)   2. Pain due to onychomycosis of toenails of both feet      Plan:  Patient was evaluated and treated and all questions answered. -Discussed and educated patient on diabetic foot care, especially with  regards to the vascular, neurological and musculoskeletal systems.  -Stressed the importance of good glycemic control and the detriment of not  controlling glucose levels in relation to the foot. -Discussed supportive shoes at all times and checking feet regularly.  -Mechanically debrided all nails 1-5 bilateral using sterile nail nipper and filed with dremel without incident  -Answered all patient questions -Patient to return  in 3 months for at risk foot care -Patient advised to call the office if any problems or questions arise in the meantime.   [name], DPM

## 2021-07-24 ENCOUNTER — Other Ambulatory Visit: Payer: Self-pay | Admitting: Family Medicine

## 2021-07-24 ENCOUNTER — Telehealth: Payer: Self-pay

## 2021-07-24 MED ORDER — AMLODIPINE BESYLATE 10 MG PO TABS: 10.0000 mg | ORAL_TABLET | Freq: Every day | ORAL | 0 refills | Status: DC

## 2021-07-24 NOTE — Telephone Encounter (Signed)
Chart reviewed once I had computer access. James Glover denied this today (stating "I don't see pt is on this anymore".)  It does not appear that patient was called to discuss/verify, or advise of reason for denial.  Looks like it was originally prescribed by another doctor (nephro?) in 10/2020, but we prescribed it in 01/2021, and 04/27/21 x #90. Looks like it was d/c'd by [place] on 05/28/21 at his visit. Nephro note from 05/26/21 reviewed. This medication was on his med list, and was not changed.  I believe it was removed from his med list at 3/30 visit in error.  I'm sending in a refill. Sabrina--Whenever a medication is denied, the patient needs to be called, especially when it is truly not clear from the chart.  Med was removed from the list on 3/30, no reason given in chart, and pt requesting refill when due.  Mickel Baas, this should be referred back to [facility] for refill when I'm out of office and patient is out of medication.  They were likely still in office prior to 4pm when this was sent to me

## 2021-07-24 NOTE — Telephone Encounter (Signed)
Wife called regarding refill for Amlodipine, said was denied, advised her that CMA wasn't sure if pt was still taking this.  Last refill was by Dr. Tomi Bamberger 04/27/21 for 90 days wife said he takes it every day.  BP running good,  today was 136/59.  Do you want pt to continue this medication?  If so Please send to [facility].  (Advised her that you were out of office)

## 2021-07-24 NOTE — Telephone Encounter (Signed)
I don't see pt is on this anymore

## 2021-08-06 ENCOUNTER — Other Ambulatory Visit: Payer: Self-pay

## 2021-08-06 NOTE — Patient Outreach (Signed)
[facility] [facility]) Care Management  08/06/2021  James Glover August 07, 1945 [id number]   Case Closure   Multiple attempts to establish contact with patient without success.    Plan: RN CM will close case.  Enzo Montgomery, RN,BSN,CCM [place] Management Telephonic Care Management Coordinator Direct Phone: [phone] Toll Free: [phone] Fax: [phone]

## 2021-08-10 ENCOUNTER — Ambulatory Visit: Payer: Medicare PPO | Admitting: Family Medicine

## 2021-08-10 ENCOUNTER — Encounter: Payer: Self-pay | Admitting: Family Medicine

## 2021-08-10 VITALS — BP 120/60 | HR 63 | Temp 97.2°F | Wt 235.0 lb

## 2021-08-10 DIAGNOSIS — L299 Pruritus, unspecified: Secondary | ICD-10-CM | POA: Diagnosis not present

## 2021-08-10 DIAGNOSIS — R112 Nausea with vomiting, unspecified: Secondary | ICD-10-CM | POA: Diagnosis not present

## 2021-08-10 DIAGNOSIS — H6122 Impacted cerumen, left ear: Secondary | ICD-10-CM

## 2021-08-10 DIAGNOSIS — R82998 Other abnormal findings in urine: Secondary | ICD-10-CM

## 2021-08-10 DIAGNOSIS — B001 Herpesviral vesicular dermatitis: Secondary | ICD-10-CM

## 2021-08-10 LAB — POCT URINALYSIS DIP (PROADVANTAGE DEVICE)
Bilirubin, UA: NEGATIVE
Blood, UA: NEGATIVE
Glucose, UA: >=1000 mg/dL — AB
Ketones, POC UA: NEGATIVE mg/dL
Leukocytes, UA: NEGATIVE
Nitrite, UA: NEGATIVE
Protein Ur, POC: NEGATIVE mg/dL
Specific Gravity, Urine: 1.01
Urobilinogen, Ur: 1
pH, UA: 6 (ref 5.0–8.0)

## 2021-08-10 MED ORDER — VALACYCLOVIR HCL 1 G PO TABS: ORAL_TABLET | ORAL | 0 refills | Status: DC

## 2021-08-10 NOTE — Patient Instructions (Addendum)
Use drops for the ear wax (ie Debrox, or ask the pharmacist about others). If your hearing and ear fullness doesn't improve, then return for an office visit and we can work on getting the wax cleaned out for you.   Use 1% hydrocortisone cream 2-3 times daily for the itching. Try not to scratch. Keep the skin well moisturized, as dry skin tends to itch more.

## 2021-08-10 NOTE — Progress Notes (Signed)
Chief Complaint  Patient presents with   Acute Visit    Thursday night pt started throwing up and had a low grade temp between 99-100.5. Sunday night it dropped to 96.9. he has had dark urine, fever blisters. Left ear is uncomfortable.itchy rash on torso and front and back of arms. Covid test negative as of last night   The night of 6/8 he started feeling nauseated.  He vomited once that night, and once Friday morning.  He had upper abdominal pain. He had lowgrade fever starting 6/10 (99-100.5), remained up through Saturday night.  Temp was low yesterday morning.  No further fevers, no chills (just on Friday night).  No URI symptoms, no cough, shortness of breath. He had a negative COVID test last night. He has been itchy at his left upper anterior arm, R antecubital fossa and anterior forearm.  He has been scratching at his stomach.  This has been for the last 2 days, itching started Saturday night. Back also is itchy, but can't scratch it.  He has been mowing the yard, no other yard work.  He noticed fever blisters (a few small ones) along the lower lip, as well as the upper lip, starting yesterday. It got worse today and he took 1 valtrex this morning (only  had 2 left).  He needs a refill.  He never had any diarrhea. Bowels are normal, no melena, hematochezia, constipation.  No further abdominal pain or vomiting.  Sugar 97 this morning, 110 yesterday.  PMH, PSH, SH reviewed  Outpatient Encounter Medications as of 08/10/2021  Medication Sig Note   ACCU-CHEK GUIDE test strip USE AS DIRECTED    amLODipine (NORVASC) 10 MG tablet Take 1 tablet (10 mg total) by mouth daily.    aspirin EC 81 MG tablet Take 81 mg by mouth daily. Swallow whole.    dapagliflozin propanediol (FARXIGA) 10 MG TABS tablet Take 1 tablet (10 mg total) by mouth daily before breakfast.    furosemide (LASIX) 20 MG tablet Take 20 mg by mouth.    Multiple Vitamins-Minerals (MULTIVITAMIN WITH MINERALS) tablet Take 1 tablet  by mouth daily. 03/09/2021: Centrum Silver Men's 50+   Omega-3 Fatty Acids (FISH OIL PO) Take 1,400 mg by mouth daily.    omeprazole (PRILOSEC) 40 MG capsule Take 1 capsule (40 mg total) by mouth in the morning and at bedtime.    oxybutynin (DITROPAN) 5 MG tablet Take 5 mg by mouth daily.    saxagliptin HCl (ONGLYZA) 2.5 MG TABS tablet TAKE 1 TABLET(2.5 MG) BY MOUTH DAILY    simvastatin (ZOCOR) 20 MG tablet TAKE 1 TABLET(20 MG) BY MOUTH AT BEDTIME    tamsulosin (FLOMAX) 0.4 MG CAPS capsule Take 1 capsule (0.4 mg total) by mouth 2 (two) times daily after a meal. For urinary urgency or weak stream. (Patient taking differently: Take 0.4 mg by mouth. For urinary urgency or weak stream.)    TRESIBA FLEXTOUCH 100 UNIT/ML FlexTouch Pen INJECT 18-20 UNITS INTO THE SKIN AT BEDTIME 08/10/2021: Using 20 U   clotrimazole (LOTRIMIN) 1 % cream Apply 1 application topically 2 (two) times daily as needed (jock itch). (Patient not taking: Reported on 08/10/2021)    valACYclovir (VALTREX) 1000 MG tablet TAKE 2 TABLETS BY MOUTH AT ONSET OF COLD SORE. REPEAT ONCE IN 12 HOURS (4 TABS/COURSE)    [DISCONTINUED] ketoconazole (NIZORAL) 2 % shampoo Apply 1 application topically 2 (two) times a week. (Patient not taking: Reported on 05/28/2021) 05/28/2021: Ran out today   [DISCONTINUED] valACYclovir (VALTREX)  1000 MG tablet valacyclovir 1 gram tablet  TAKE 2 TABLETS BY MOUTH NOW. REPEAT IN 12 HOURS ONCE (Patient not taking: Reported on 05/28/2021)    No facility-administered encounter medications on file as of 08/10/2021.   Allergies  Allergen Reactions   Ace Inhibitors Cough   Statins Cough   ROS: Denies headache. No further fever, no URI symptoms.  He complains of L ear feeling stopped up for 4-5 days, can't hear as well out of that ear. No chest pain, shortness of breath, dizziness, syncope. Equilibrium is a little off when he stands up. No diarrhea. Itching on arms, chest per HPI.  Rash mainly from scratching at it. See  HPI   PHYSICAL EXAM:  BP 120/60   Pulse 63   Temp (!) 97.2 F (36.2 C)   Wt 235 lb (106.6 kg)   SpO2 97%   BMI 29.37 kg/m   Wt Readings from Last 3 Encounters:  08/10/21 235 lb (106.6 kg)  05/28/21 241 lb (109.3 kg)  03/09/21 255 lb 12.8 oz (116 kg)   Well-appearing male, in no distress. Face is somewhat tan, with pale area around eyes (sunglasses area). HEENT: conjunctiva and sclera are clear, anicteric, EOMI No nasal drainage, OP is clear. Lips are somewhat swollen/dry, no scabbing noted, only very slight lesions. L TM is obscured by cerumen.  R TM and EAC are normal. Sinuses nontender. Neck: no lymphadenopathy, thyromegaly or mass Heart: regular rate and rhythm Lungs: clear bilaterally Back: no spinal or CVA tenderness Skin: Very slight areas of erythema at L mid-upper arm, and anterior right arm (antecubital fossa and extending to forearm). Skin on back is dry, no rash. Abdomen:  multiple excoriated areas, no other rash noted. Abdomen is soft, nontender, no organomegaly or mass Extremities: no edema Psych: normal mood, affect, hygiene and grooming  Urine dip: SG 1.010, neg bili, ketones, leuks, nitrite, blood. ++glu.  Clear, yellow color (initially noted as brown in results--visualized by myself and lab tech and was NOT brown, was clear yellow.)   ASSESSMENT/PLAN:  Nausea and vomiting, unspecified vomiting type - This was associated with LG fever, and all symptoms have resolved.  Suspect poss viral syndrome.  Normal exam, pt reassured. - Plan: Comprehensive metabolic panel, CBC with Differential/Platelet  Dark urine - Pt reassured regarding urine today--no bilirubin or blood, just glu as expected from DM meds. To stay well hydrated - Plan: POCT Urinalysis DIP (Proadvantage Device), Comprehensive metabolic panel  Herpes labialis - instructed on proper use of valtrex (to start sooner in the future) - Plan: valACYclovir (VALTREX) 1000 MG tablet  Impacted cerumen of left  ear - rec OTC drops to soften wax.  If ongoing plugging and decreased hearing, to schedule visit for lavage  Pruritus - focal areas of arms, abdomen, back. Skin is dry. Moisturize, HC BID-TID. Doubt related to kidney/liver given benign u/a today. labs if persists   C-met and CBC were ordered when urine color was reported as "brown", with fairly normal dip (other than glucosuria, as expected from [place]).  I personally saw urine, and was normal yellow in color. Given normal exam, improvement of his symptoms, elected to hold off on labs. With his illness/vomiting, he likely was dehydrated and saw darker urine.  F/u if symptoms persist/worsen or change.  I spent 34 minutes dedicated to the care of this patient, including pre-visit review of records, face to face time, post-visit ordering of testing and documentation.

## 2021-08-11 ENCOUNTER — Other Ambulatory Visit: Payer: Self-pay | Admitting: Family Medicine

## 2021-08-11 DIAGNOSIS — D4101 Neoplasm of uncertain behavior of right kidney: Secondary | ICD-10-CM | POA: Diagnosis not present

## 2021-08-11 DIAGNOSIS — C61 Malignant neoplasm of prostate: Secondary | ICD-10-CM | POA: Diagnosis not present

## 2021-08-11 DIAGNOSIS — K219 Gastro-esophageal reflux disease without esophagitis: Secondary | ICD-10-CM

## 2021-08-11 NOTE — Telephone Encounter (Signed)
Lvm for pt to call back to advise if he has enough to last until his appointment. [place]

## 2021-08-13 DIAGNOSIS — N281 Cyst of kidney, acquired: Secondary | ICD-10-CM | POA: Diagnosis not present

## 2021-08-13 DIAGNOSIS — K8689 Other specified diseases of pancreas: Secondary | ICD-10-CM | POA: Diagnosis not present

## 2021-08-13 DIAGNOSIS — N2889 Other specified disorders of kidney and ureter: Secondary | ICD-10-CM | POA: Diagnosis not present

## 2021-08-13 DIAGNOSIS — I700 Atherosclerosis of aorta: Secondary | ICD-10-CM | POA: Diagnosis not present

## 2021-08-13 DIAGNOSIS — D49511 Neoplasm of unspecified behavior of right kidney: Secondary | ICD-10-CM | POA: Diagnosis not present

## 2021-08-18 DIAGNOSIS — N5201 Erectile dysfunction due to arterial insufficiency: Secondary | ICD-10-CM | POA: Diagnosis not present

## 2021-08-18 DIAGNOSIS — R350 Frequency of micturition: Secondary | ICD-10-CM | POA: Diagnosis not present

## 2021-08-18 DIAGNOSIS — N281 Cyst of kidney, acquired: Secondary | ICD-10-CM | POA: Diagnosis not present

## 2021-08-18 DIAGNOSIS — C61 Malignant neoplasm of prostate: Secondary | ICD-10-CM | POA: Diagnosis not present

## 2021-08-21 ENCOUNTER — Other Ambulatory Visit: Payer: Self-pay | Admitting: Family Medicine

## 2021-08-21 DIAGNOSIS — E1122 Type 2 diabetes mellitus with diabetic chronic kidney disease: Secondary | ICD-10-CM

## 2021-09-03 ENCOUNTER — Other Ambulatory Visit: Payer: Medicare PPO

## 2021-09-03 ENCOUNTER — Other Ambulatory Visit: Payer: Self-pay | Admitting: Family Medicine

## 2021-09-03 DIAGNOSIS — E1122 Type 2 diabetes mellitus with diabetic chronic kidney disease: Secondary | ICD-10-CM | POA: Diagnosis not present

## 2021-09-03 DIAGNOSIS — Z5181 Encounter for therapeutic drug level monitoring: Secondary | ICD-10-CM

## 2021-09-03 DIAGNOSIS — N183 Chronic kidney disease, stage 3 unspecified: Secondary | ICD-10-CM | POA: Diagnosis not present

## 2021-09-03 DIAGNOSIS — D519 Vitamin B12 deficiency anemia, unspecified: Secondary | ICD-10-CM

## 2021-09-03 DIAGNOSIS — Z794 Long term (current) use of insulin: Secondary | ICD-10-CM | POA: Diagnosis not present

## 2021-09-04 LAB — COMPREHENSIVE METABOLIC PANEL
ALT: 15 IU/L (ref 0–44)
AST: 15 IU/L (ref 0–40)
Albumin/Globulin Ratio: 1.4 (ref 1.2–2.2)
Albumin: 4.1 g/dL (ref 3.7–4.7)
Alkaline Phosphatase: 129 IU/L — ABNORMAL HIGH (ref 44–121)
BUN/Creatinine Ratio: 16 (ref 10–24)
BUN: 29 mg/dL — ABNORMAL HIGH (ref 8–27)
Bilirubin Total: 0.5 mg/dL (ref 0.0–1.2)
CO2: 24 mmol/L (ref 20–29)
Calcium: 9.2 mg/dL (ref 8.6–10.2)
Chloride: 99 mmol/L (ref 96–106)
Creatinine, Ser: 1.85 mg/dL — ABNORMAL HIGH (ref 0.76–1.27)
Globulin, Total: 2.9 g/dL (ref 1.5–4.5)
Glucose: 94 mg/dL (ref 70–99)
Potassium: 4.6 mmol/L (ref 3.5–5.2)
Sodium: 139 mmol/L (ref 134–144)
Total Protein: 7 g/dL (ref 6.0–8.5)
eGFR: 38 mL/min/{1.73_m2} — ABNORMAL LOW (ref >59)

## 2021-09-04 LAB — CBC WITH DIFFERENTIAL/PLATELET
Basophils Absolute: 0.1 10*3/uL (ref 0.0–0.2)
Basos: 1 %
EOS (ABSOLUTE): 0.2 10*3/uL (ref 0.0–0.4)
Eos: 3 %
Hematocrit: 41.1 % (ref 37.5–51.0)
Hemoglobin: 13.3 g/dL (ref 13.0–17.7)
Immature Grans (Abs): 0 10*3/uL (ref 0.0–0.1)
Immature Granulocytes: 0 %
Lymphocytes Absolute: 0.9 10*3/uL (ref 0.7–3.1)
Lymphs: 13 %
MCH: 29.8 pg (ref 26.6–33.0)
MCHC: 32.4 g/dL (ref 31.5–35.7)
MCV: 92 fL (ref 79–97)
Monocytes Absolute: 0.6 10*3/uL (ref 0.1–0.9)
Monocytes: 8 %
Neutrophils Absolute: 5.2 10*3/uL (ref 1.4–7.0)
Neutrophils: 75 %
Platelets: 155 10*3/uL (ref 150–450)
RBC: 4.46 x10E6/uL (ref 4.14–5.80)
RDW: 14.6 % (ref 11.6–15.4)
WBC: 6.8 10*3/uL (ref 3.4–10.8)

## 2021-09-04 LAB — HEMOGLOBIN A1C
Est. average glucose Bld gHb Est-mCnc: 157 mg/dL
Hgb A1c MFr Bld: 7.1 % — ABNORMAL HIGH (ref 4.8–5.6)

## 2021-09-04 LAB — LIPID PANEL
Chol/HDL Ratio: 3.1 ratio (ref 0.0–5.0)
Cholesterol, Total: 156 mg/dL (ref 100–199)
HDL: 50 mg/dL (ref >39)
LDL Chol Calc (NIH): 90 mg/dL (ref 0–99)
Triglycerides: 83 mg/dL (ref 0–149)
VLDL Cholesterol Cal: 16 mg/dL (ref 5–40)

## 2021-09-04 LAB — VITAMIN B12: Vitamin B-12: 986 pg/mL (ref 232–1245)

## 2021-09-04 LAB — TSH: TSH: 2.06 u[IU]/mL (ref 0.450–4.500)

## 2021-09-06 NOTE — Patient Instructions (Incomplete)
I recommend getting the updated bivalent COVID booster when it becomes available in the Fall.  Decrease the omeprazole to just once daily instead of twice. If you notice any recurrent heartburn or increased cough or hoarseness, then go back to taking it twice daily.  Check your sugars more frequently 2 hours after dinner.  Goal should be <140-160.  Please cut back on fried fish, as that is likely breaded, and contributing to your higher sugars after eating it. Continue your same medications.  Your kidney function seems a little worse than last check. It is important to keep the blood pressure at goal (it is), and your sugars down (ideally A1c of under 7%, you were slightly higher this time). Stay well hydrated.  This can also adversely affect your kidneys.  Drink more water. I will send your results to Dr. Marval Regal.  Since you got a new blood pressure monitor, bring it with you to your next visit so that we can verify the accuracy.  Consider water exercise/swimming or biking as these provide good cardio without hurting the knee as much.

## 2021-09-06 NOTE — Progress Notes (Unsigned)
No chief complaint on file.  Patient presents for follow-up on chronic problems. See below for labs done prior to his visit.  He was seen last month with vomiting and LG fevers, improved by the time of visit.  DM--medications were changed at his physical in January.  We stopped the pioglitazone and switched to [place], to help with his CKD. He denies side effects (uses antifungal and [place] powder as needed for any slight rashes or irritation). He continues on  20 U of [place] and Onglyza. Sugars are running   He has been back to podiatrist for routine foot care, denies foot concerns. Last diabetic eye exam was    Hypertension and CKD stage 3, under the care of nephro. He tries to follow a low sodium diet. BP's have been running   BP Readings from Last 3 Encounters:  08/10/21 120/60  05/28/21 120/62  03/09/21 (!) 160/80   B12 deficiency--level was low 01/2019 (294). Improved when taking daily MVI.  He continues on multivitamin, no separate D. He is on high dose PPI for GERD from Dr. Collene Mares. He states the reflux is well controlled.  He no longer coughs, but does not some drainage at night, throat-clearing at night.    Hyperlipidemia: he is compliant with taking simvastatin, denies side effects.  ROS:  no fever, chills, headaches, dizziness, chest pain, shortness of breath. Denies depression, nausea, vomiting, bowel changes, vision changes, bleeding, bruising, rash. +R knee pain, no other joint pains. No edema in the morning, some at the end of the day, at L ankle only No further nipple pain.  See HPI  PHYSICAL EXAM:  There were no vitals taken for this visit.  Wt Readings from Last 3 Encounters:  08/10/21 235 lb (106.6 kg)  05/28/21 241 lb (109.3 kg)  03/09/21 255 lb 12.8 oz (116 kg)    Well-appearing, pleasant male, in no distress.  Occasional throat-clearing during visit, no coughing.   HEENT: conjunctiva and sclera are clear, EOMI. OP clear.  No nasal  drainage. Neck: no lymphadenopathy or mass, no bruit. Heart: regular rate and rhythm Lungs: clear, no wheezes, rales, ronchi Back: no CVA tenderness or spinal tenderness Abdomen: soft, nontender, no mass Extremities: trace pretibial edema, symmetric. Neuro: alert and oriented, normal gait Psych: normal mood, affect, normal eye contact, grooming. Skin: no rashes, normal turgor  ***UPDATE EDEMA RECHECK EARS--CERUMEN ON L??  Lab Results  Component Value Date   HGBA1C 7.1 (H) 09/03/2021   Fasting glucose 94    Chemistry      Component Value Date/Time   NA 139 09/03/2021 0828   K 4.6 09/03/2021 0828   CL 99 09/03/2021 0828   CO2 24 09/03/2021 0828   BUN 29 (H) 09/03/2021 0828   CREATININE 1.85 (H) 09/03/2021 0828   CREATININE 1.41 (H) 11/11/2020 1022      Component Value Date/Time   CALCIUM 9.2 09/03/2021 0828   ALKPHOS 129 (H) 09/03/2021 0828   AST 15 09/03/2021 0828   ALT 15 09/03/2021 0828   BILITOT 0.5 09/03/2021 0828     Lab Results  Component Value Date   WBC 6.8 09/03/2021   HGB 13.3 09/03/2021   HCT 41.1 09/03/2021   MCV 92 09/03/2021   PLT 155 09/03/2021   Lab Results  Component Value Date   TSH 2.060 09/03/2021   Lab Results  Component Value Date   VELFYBOF75 102 09/03/2021    ASSESSMENT/PLAN:  Consider L ear lavage if still blocked (was at visit last  month)  A1c borderline Onglyza RF needed  Discuss switch to Ozempic, in place of onglyza. No renal dosing needed. Worsening Cr. ?dehydrated??

## 2021-09-07 ENCOUNTER — Ambulatory Visit (INDEPENDENT_AMBULATORY_CARE_PROVIDER_SITE_OTHER): Payer: Medicare PPO | Admitting: Family Medicine

## 2021-09-07 ENCOUNTER — Encounter: Payer: Self-pay | Admitting: Family Medicine

## 2021-09-07 VITALS — BP 128/68 | HR 60 | Ht 74.75 in | Wt 233.2 lb

## 2021-09-07 DIAGNOSIS — E1122 Type 2 diabetes mellitus with diabetic chronic kidney disease: Secondary | ICD-10-CM | POA: Diagnosis not present

## 2021-09-07 DIAGNOSIS — K219 Gastro-esophageal reflux disease without esophagitis: Secondary | ICD-10-CM | POA: Diagnosis not present

## 2021-09-07 DIAGNOSIS — I10 Essential (primary) hypertension: Secondary | ICD-10-CM | POA: Diagnosis not present

## 2021-09-07 DIAGNOSIS — N183 Chronic kidney disease, stage 3 unspecified: Secondary | ICD-10-CM | POA: Diagnosis not present

## 2021-09-07 DIAGNOSIS — Z794 Long term (current) use of insulin: Secondary | ICD-10-CM | POA: Diagnosis not present

## 2021-09-07 DIAGNOSIS — D519 Vitamin B12 deficiency anemia, unspecified: Secondary | ICD-10-CM

## 2021-09-07 MED ORDER — ONGLYZA 2.5 MG PO TABS: ORAL_TABLET | ORAL | 1 refills | Status: DC

## 2021-09-08 ENCOUNTER — Encounter: Payer: Self-pay | Admitting: *Deleted

## 2021-09-09 ENCOUNTER — Encounter: Payer: Self-pay | Admitting: Family Medicine

## 2021-09-18 ENCOUNTER — Other Ambulatory Visit: Payer: Self-pay | Admitting: Family Medicine

## 2021-09-18 DIAGNOSIS — N183 Chronic kidney disease, stage 3 unspecified: Secondary | ICD-10-CM

## 2021-09-28 ENCOUNTER — Other Ambulatory Visit: Payer: Self-pay | Admitting: Family Medicine

## 2021-09-28 DIAGNOSIS — Z794 Long term (current) use of insulin: Secondary | ICD-10-CM

## 2021-10-01 ENCOUNTER — Other Ambulatory Visit: Payer: Self-pay | Admitting: Family Medicine

## 2021-10-01 DIAGNOSIS — E785 Hyperlipidemia, unspecified: Secondary | ICD-10-CM

## 2021-10-20 ENCOUNTER — Other Ambulatory Visit: Payer: Self-pay | Admitting: Family Medicine

## 2021-10-22 DIAGNOSIS — N2581 Secondary hyperparathyroidism of renal origin: Secondary | ICD-10-CM | POA: Diagnosis not present

## 2021-10-22 DIAGNOSIS — R600 Localized edema: Secondary | ICD-10-CM | POA: Diagnosis not present

## 2021-10-22 DIAGNOSIS — I129 Hypertensive chronic kidney disease with stage 1 through stage 4 chronic kidney disease, or unspecified chronic kidney disease: Secondary | ICD-10-CM | POA: Diagnosis not present

## 2021-10-22 DIAGNOSIS — E785 Hyperlipidemia, unspecified: Secondary | ICD-10-CM | POA: Diagnosis not present

## 2021-10-22 DIAGNOSIS — E611 Iron deficiency: Secondary | ICD-10-CM | POA: Diagnosis not present

## 2021-10-22 DIAGNOSIS — C61 Malignant neoplasm of prostate: Secondary | ICD-10-CM | POA: Diagnosis not present

## 2021-10-22 DIAGNOSIS — E1122 Type 2 diabetes mellitus with diabetic chronic kidney disease: Secondary | ICD-10-CM | POA: Diagnosis not present

## 2021-10-22 DIAGNOSIS — N1831 Chronic kidney disease, stage 3a: Secondary | ICD-10-CM | POA: Diagnosis not present

## 2021-10-22 DIAGNOSIS — N179 Acute kidney failure, unspecified: Secondary | ICD-10-CM | POA: Diagnosis not present

## 2021-10-23 ENCOUNTER — Other Ambulatory Visit: Payer: Self-pay | Admitting: Family Medicine

## 2021-10-28 LAB — MICROALBUMIN / CREATININE URINE RATIO: Microalb Creat Ratio: 1.76

## 2021-11-04 ENCOUNTER — Encounter: Payer: Self-pay | Admitting: Internal Medicine

## 2021-11-05 ENCOUNTER — Encounter: Payer: Self-pay | Admitting: Family Medicine

## 2021-11-16 ENCOUNTER — Ambulatory Visit: Payer: Medicare PPO | Admitting: Podiatry

## 2021-11-16 ENCOUNTER — Encounter: Payer: Self-pay | Admitting: Podiatry

## 2021-11-16 DIAGNOSIS — Z794 Long term (current) use of insulin: Secondary | ICD-10-CM | POA: Diagnosis not present

## 2021-11-16 DIAGNOSIS — B351 Tinea unguium: Secondary | ICD-10-CM | POA: Diagnosis not present

## 2021-11-16 DIAGNOSIS — N183 Chronic kidney disease, stage 3 unspecified: Secondary | ICD-10-CM

## 2021-11-16 DIAGNOSIS — M79674 Pain in right toe(s): Secondary | ICD-10-CM | POA: Diagnosis not present

## 2021-11-16 DIAGNOSIS — E1122 Type 2 diabetes mellitus with diabetic chronic kidney disease: Secondary | ICD-10-CM | POA: Diagnosis not present

## 2021-11-16 DIAGNOSIS — M79675 Pain in left toe(s): Secondary | ICD-10-CM | POA: Diagnosis not present

## 2021-11-16 NOTE — Progress Notes (Signed)
  Subjective:  Patient ID: James Glover, male    DOB: September 21, 1945,   MRN: [id number]  Chief Complaint  Patient presents with   Diabetes    Routine foot care with nail trim. Ac1= 7.1 in July 2023. FSBS=119 this morning.     76 y.o. male presents for concern of thickened elongated and painful nails that are difficult to trim. Requesting to have them trimmed today. Denies any burning or tingling in her feet. Patient is diabetic and last A1c was 6.9.   . Denies any other pedal complaints. Denies n/v/f/c.   PCP: [name] MD   Past Medical History:  Diagnosis Date   CKD (chronic kidney disease), stage III [facility])    nephrologist-- [place]--- per lov note 03-08-2018 in epic, stable (baseline Cr 1.5 - 2)   Dyslipidemia    Erectile dysfunction    First degree heart block    GERD (gastroesophageal reflux disease)    Hemorrhoids    internal and external   History of nuclear stress test    05-26-2009 (by dr Rollene Fare due to HTN and DM2)--- normal study w/ no ischemia,  normal LV function and wall motion , ef 70%   Hyperplasia of prostate with lower urinary tract symptoms (LUTS)    Hypertension    Hypogonadism male    Iron deficiency    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 Tresa Moore   Prostate cancer [facility]) urologist-  dr t. Tresa Moore  oncologsit-  dr Tammi Klippel   dx 05-30-2018-- Stage T1c, Gleason 4+5, High Risk   Renal cyst, left    followed by dr t. Tresa Moore--  chronic noncomplex   Type 2 diabetes mellitus ([place])    followed by pcp   Wears partial dentures    upper    Objective:  Physical Exam: Vascular: DP/PT pulses 2/4 bilateral. CFT <3 seconds. Normal hair growth on digits. No edema.  Skin. No lacerations or abrasions bilateral feet. Nails 1-5 are thickened discolored and elongated with subungual debris.  Musculoskeletal: MMT 5/5 bilateral lower extremities in DF, PF, Inversion and Eversion. Deceased ROM in DF of ankle joint.  Neurological: Sensation intact  to light touch.   Assessment:   1. Pain due to onychomycosis of toenails of both feet   2. Controlled type 2 diabetes mellitus with stage 3 chronic kidney disease, with long-term current use of insulin ([place])       Plan:  Patient was evaluated and treated and all questions answered. -Discussed and educated patient on diabetic foot care, especially with  regards to the vascular, neurological and musculoskeletal systems.  -Stressed the importance of good glycemic control and the detriment of not  controlling glucose levels in relation to the foot. -Discussed supportive shoes at all times and checking feet regularly.  -Mechanically debrided all nails 1-5 bilateral using sterile nail nipper and filed with dremel without incident  -Answered all patient questions -Patient to return  in 3 months for at risk foot care -Patient advised to call the office if any problems or questions arise in the meantime.   [name], DPM

## 2021-11-23 DIAGNOSIS — C44212 Basal cell carcinoma of skin of right ear and external auricular canal: Secondary | ICD-10-CM | POA: Diagnosis not present

## 2021-11-23 DIAGNOSIS — D485 Neoplasm of uncertain behavior of skin: Secondary | ICD-10-CM | POA: Diagnosis not present

## 2021-11-23 DIAGNOSIS — L570 Actinic keratosis: Secondary | ICD-10-CM | POA: Diagnosis not present

## 2021-11-23 DIAGNOSIS — L821 Other seborrheic keratosis: Secondary | ICD-10-CM | POA: Diagnosis not present

## 2021-11-29 DIAGNOSIS — C4490 Unspecified malignant neoplasm of skin, unspecified: Secondary | ICD-10-CM

## 2021-11-29 HISTORY — DX: Unspecified malignant neoplasm of skin, unspecified: C44.90

## 2021-12-01 DIAGNOSIS — C44212 Basal cell carcinoma of skin of right ear and external auricular canal: Secondary | ICD-10-CM | POA: Diagnosis not present

## 2021-12-02 ENCOUNTER — Other Ambulatory Visit: Payer: Self-pay | Admitting: Family Medicine

## 2021-12-02 DIAGNOSIS — N183 Chronic kidney disease, stage 3 unspecified: Secondary | ICD-10-CM

## 2021-12-08 ENCOUNTER — Encounter: Payer: Self-pay | Admitting: Internal Medicine

## 2021-12-16 ENCOUNTER — Telehealth: Payer: Self-pay | Admitting: Licensed Clinical Social Worker

## 2021-12-16 NOTE — Patient Outreach (Signed)
  Care Coordination   Initial Visit Note   12/16/2021 Name: James Glover MRN: [id number] DOB: 05/15/1945  CACHE BILLS is a 76 y.o. year old male who sees [name], MD for primary care. I spoke with  [name] by phone today.  What matters to the patients health and wellness today?  Care Coordination    Goals Addressed             This Visit's Progress    COMPLETED: Care Coordination Activities-No Follow Up Required       Care Coordination Interventions: Active listening / Reflection utilized  LCSW informed patient of care coordination services. Pt is not interested at this time and agreed to contact PCP, should needs arise  Patient obtained flu shot the first week of October (approx) at [facility] on [address] assessments and interventions completed:  No     Care Coordination Interventions Activated:  Yes  Care Coordination Interventions:  Yes, provided   Follow up plan: No further intervention required.   Encounter Outcome:  Pt. Refused   Christa See, MSW, [email or web address] Phone [phone] 4:55 PM

## 2021-12-16 NOTE — Patient Instructions (Signed)
Visit Information  Thank you for taking time to visit with me today. Please don't hesitate to contact me if I can be of assistance to you.   Following are the goals we discussed today:   Goals Addressed             This Visit's Progress    COMPLETED: Care Coordination Activities-No Follow Up Required       Care Coordination Interventions: Active listening / Reflection utilized  LCSW informed patient of care coordination services. Pt is not interested at this time and agreed to contact PCP, should needs arise  Patient obtained flu shot the first week of October (approx) at [facility] on [address]        If you are experiencing a [place] or [place] or need someone to talk to, please call the Suicide and Crisis Lifeline: 988 call 911   Patient verbalizes understanding of instructions and care plan provided today and agrees to view in [place]. Active MyChart status and patient understanding of how to access instructions and care plan via MyChart confirmed with patient.     No further follow up required:    Christa See, MSW, [name] Phone [phone] 4:55 PM

## 2021-12-18 ENCOUNTER — Other Ambulatory Visit: Payer: Self-pay | Admitting: Family Medicine

## 2021-12-18 DIAGNOSIS — Z794 Long term (current) use of insulin: Secondary | ICD-10-CM

## 2022-01-06 DIAGNOSIS — Z85828 Personal history of other malignant neoplasm of skin: Secondary | ICD-10-CM | POA: Diagnosis not present

## 2022-01-06 DIAGNOSIS — Z5189 Encounter for other specified aftercare: Secondary | ICD-10-CM | POA: Diagnosis not present

## 2022-01-06 DIAGNOSIS — C44212 Basal cell carcinoma of skin of right ear and external auricular canal: Secondary | ICD-10-CM | POA: Diagnosis not present

## 2022-01-18 ENCOUNTER — Other Ambulatory Visit: Payer: Self-pay | Admitting: Family Medicine

## 2022-02-06 ENCOUNTER — Other Ambulatory Visit: Payer: Self-pay | Admitting: Family Medicine

## 2022-02-06 DIAGNOSIS — N183 Chronic kidney disease, stage 3 unspecified: Secondary | ICD-10-CM

## 2022-02-06 DIAGNOSIS — K219 Gastro-esophageal reflux disease without esophagitis: Secondary | ICD-10-CM

## 2022-02-26 DIAGNOSIS — E611 Iron deficiency: Secondary | ICD-10-CM | POA: Diagnosis not present

## 2022-02-26 DIAGNOSIS — N2581 Secondary hyperparathyroidism of renal origin: Secondary | ICD-10-CM | POA: Diagnosis not present

## 2022-02-26 DIAGNOSIS — R600 Localized edema: Secondary | ICD-10-CM | POA: Diagnosis not present

## 2022-02-26 DIAGNOSIS — N179 Acute kidney failure, unspecified: Secondary | ICD-10-CM | POA: Diagnosis not present

## 2022-02-26 DIAGNOSIS — C61 Malignant neoplasm of prostate: Secondary | ICD-10-CM | POA: Diagnosis not present

## 2022-02-26 DIAGNOSIS — E785 Hyperlipidemia, unspecified: Secondary | ICD-10-CM | POA: Diagnosis not present

## 2022-02-26 DIAGNOSIS — I129 Hypertensive chronic kidney disease with stage 1 through stage 4 chronic kidney disease, or unspecified chronic kidney disease: Secondary | ICD-10-CM | POA: Diagnosis not present

## 2022-02-26 DIAGNOSIS — N1831 Chronic kidney disease, stage 3a: Secondary | ICD-10-CM | POA: Diagnosis not present

## 2022-02-26 DIAGNOSIS — E1122 Type 2 diabetes mellitus with diabetic chronic kidney disease: Secondary | ICD-10-CM | POA: Diagnosis not present

## 2022-02-26 LAB — MICROALBUMIN / CREATININE URINE RATIO: Microalb Creat Ratio: 8

## 2022-03-01 ENCOUNTER — Other Ambulatory Visit: Payer: Self-pay | Admitting: Family Medicine

## 2022-03-01 DIAGNOSIS — E1122 Type 2 diabetes mellitus with diabetic chronic kidney disease: Secondary | ICD-10-CM

## 2022-03-09 ENCOUNTER — Encounter: Payer: Self-pay | Admitting: *Deleted

## 2022-03-11 ENCOUNTER — Other Ambulatory Visit: Payer: Self-pay | Admitting: Family Medicine

## 2022-03-11 DIAGNOSIS — N183 Chronic kidney disease, stage 3 unspecified: Secondary | ICD-10-CM

## 2022-03-15 ENCOUNTER — Other Ambulatory Visit: Payer: Medicare PPO

## 2022-03-17 NOTE — Progress Notes (Addendum)
Chief Complaint  Patient presents with   Medicare Wellness    Fasting AWV/CPE. Reports that his evening blood sugar has gone up over the last 2 months. No other concerns.    James Glover is a 77 y.o. male who presents for annual physical exam, Medicare wellness visit and follow-up on chronic medical conditions.    DM--He reports compliance with [place] (using 20 U), Farxiga and Onglyza. Sugars have been higher in November and December.  Fasting sugars are running 97-154. Bedtime sugars are 143-260, frequently 160-180's, a few in the 200's.  His notes were reviewed, noted to be higher when had cornbread, potatoes, some fried foods. Had some more sweets through the holidays (which were not notated on the sugar log with the dinner/food listed). He denies hypoglycemia, polydipsia, polyuria. Last A1c was 7.1 in 08/2021.  He has had some issues with recurrent tinea cruris even prior to taking farxiga (at one point being treated by his derm with 4 weeks of terbinafine). He notes some itching and slight redness at his lower buttock and perineum and uses lotrimin and [place] powder which help. Still uses lotrimin regularly, sometimes up to 2x/d, sometimes has rash, sometimes just itching. He sees the podiatrist for routine foot care, denies foot concerns. Last diabetic eye exam was 04/2021, no retinopathy.    Hypertension and CKD stage 3, under the care of nephro, last seen the end of December. Cr 1.86. He is compliant with amlodipine and furosemide, and denies side effects. He tries to follow a low sodium diet. He denies headaches, dizziness, chest pain, shortness of breath. He denies any edema. BP is checked infrequently, and was 120/70 on 1/15.   BP Readings from Last 3 Encounters:  03/18/22 130/70  09/07/21 128/68  08/10/21 120/60   B12 deficiency--level was low 01/2019 (294). Improved when taking daily MVI.  He continues on multivitamin, no separate B12. Lab Results  Component Value  Date   [id number] 09/03/2021   He was on high dose PPI (BID at his last visit) for GERD from Dr. Collene Mares. We had discussed trying it once daily, and increasing back to BID if he had any recurrent symptoms (cough, throat-clearing, heartburn). He is doing well taking it just once at night, and denies any heartburn.   Hyperlipidemia: he is compliant with taking simvastatin, denies side effects. Lipids were at goal on last check. Aortic atherosclerosis was incidentally noted on abdominal CT 09/2020.  Lab Results  Component Value Date   CHOL 156 09/03/2021   HDL 50 09/03/2021   LDLCALC 90 09/03/2021   TRIG 83 09/03/2021   CHOLHDL 3.1 09/03/2021    Prostate cancer: diagnosed in 04/2018.  He is s/p radiation treatments. He was treated with q 6 month Lupron injections for 2 years, stopped in 07/2020.  Last PSA was <0.01 in 07/2021. Last CT was 07/2021, stable R mass and bilateral renal cysts  He was having R nipple pain last year.  Mammogram and [place] was ordered, but patient never got it done because it stopped hurting.  He denies any known lump or pain.  Immunization History  Administered Date(s) Administered   DTaP 07/25/2008   Fluad Quad(high Dose 65+) 02/21/2020   Influenza Split 11/17/2011, 12/30/2013, 12/26/2014   Influenza, High Dose Seasonal PF 01/30/2013, 11/12/2015, 12/08/2016, 01/16/2018, 12/25/2018, 02/10/2021   Influenza-Unspecified 12/16/2016, 02/12/2021, 11/29/2021   Moderna SARS-COV2 Booster Vaccination 02/21/2020, 08/21/2020   Moderna Sars-Covid-2 Vaccination 05/15/2019, 06/12/2019   Pneumococcal Conjugate-13 03/04/2014   Pneumococcal Polysaccharide-23 11/29/2009,  04/09/2015   Tdap 07/25/2008, 05/04/2021   Zoster Recombinat (Shingrix) 05/22/2021, 07/06/2021   Zoster, Live 09/05/2013   Last colonoscopy: 05/05/10. Negative Cologuard in 07/2020. Last PSA: <0.01 in June 2023 by urologist Dentist: goes 2-3 times per year.  Ophtho: yearly (last 04/2021) Exercise: walks 1/2 - 1 mile  daily 10-20 minutes.  He is also doing home exercises (squats, leg lifts), uses weights. Also does upper body exercises with weights.   Patient Care Team: [name], MD as PCP - General (Family Medicine) Juanita Craver, MD as Consulting Physician (Gastroenterology) Cira Rue, RN Nurse Navigator as Registered Nurse (Medical Oncology) Virl Cagey, MD as Consulting Physician (General Surgery) Dr. Augustin Schooling DDS as Consulting Physician (Dentistry) Marygrace Drought, MD as Consulting Physician (Ophthalmology) Donato Heinz, MD as Consulting Physician (Nephrology) Troy Sine, MD as Consulting Physician (Cardiology) [place], Jennefer Bravo, MD as Referring Physician (Dermatology) Alexis Frock, MD as Consulting Physician (Urology) Tyler Pita, MD as Consulting Physician (Radiation Oncology) Nicholes Stairs, MD as Consulting Physician (Orthopedic Surgery) Pedro Earls, MD as Attending Physician (Family Medicine) Dentist--Dr. Pamella Pert  Depression Screening: [place] Office Visit from 03/18/2022 in [place]  PHQ-2 Total Score 0        Falls screen:     03/18/2022    1:37 PM 09/07/2021    2:32 PM 05/28/2021    2:44 PM 05/06/2021    9:51 AM 03/09/2021    1:41 PM  [place] in the past year? 0 0 1 0 0  Number falls in past yr: 0 0 0 0 0  Comment   Sept 22-tripped over chair    Injury with Fall? 0 0 0 0 0  Risk for fall due to : No Fall Risks No Fall Risks No Fall Risks Medication side effect No Fall Risks  Follow up Falls evaluation completed Falls evaluation completed Falls evaluation completed Falls evaluation completed Falls evaluation completed     Functional Status Survey: Is the patient deaf or have difficulty hearing?: No Does the patient have difficulty seeing, even when wearing glasses/contacts?: No Does the patient have difficulty concentrating, remembering, or making decisions?: Yes (short term memory, age  related) Does the patient have difficulty walking or climbing stairs?: No Does the patient have difficulty dressing or bathing?: No Does the patient have difficulty doing errands alone such as visiting a doctor's office or shopping?: No  Mini-Cog Scoring: 5   No major memory concerns (slower recall with Jeopardy)--takes longer to recall, wishes there was a multiple choice option!    End of Life Discussion:  Patient has filled out his living will, needs to get it notarized. Plans to do next week.   PMH, PSH, SH and FH reviewed and updated. Outpatient Encounter Medications as of 03/18/2022  Medication Sig Note   ACCU-CHEK GUIDE test strip USE AS DIRECTED    amLODipine (NORVASC) 10 MG tablet TAKE 1 TABLET(10 MG) BY MOUTH DAILY    aspirin EC 81 MG tablet Take 81 mg by mouth daily. Swallow whole.    BD PEN NEEDLE NANO 2ND GEN 32G X 4 MM MISC USE AS NEEDED AS DIRECTED    dapagliflozin propanediol (FARXIGA) 10 MG TABS tablet TAKE 1 TABLET BY MOUTH EVERY DAY BEFORE BREAKFAST    furosemide (LASIX) 20 MG tablet Take 20 mg by mouth.    insulin degludec (TRESIBA FLEXTOUCH) 100 UNIT/ML FlexTouch Pen ADMINISTER 18 TO 20 UNITS UNDER THE SKIN AT BEDTIME 03/18/2022: 20 units  Multiple Vitamins-Minerals (MULTIVITAMIN WITH MINERALS) tablet Take 1 tablet by mouth daily. 03/09/2021: Centrum Silver Men's 50+   Omega-3 Fatty Acids (FISH OIL PO) Take 1,400 mg by mouth daily.    omeprazole (PRILOSEC) 40 MG capsule TAKE 1 CAPSULE(40 MG) BY MOUTH IN THE MORNING AND AT BEDTIME 03/18/2022: Takes once daily at bedtime   oxybutynin (DITROPAN) 5 MG tablet Take 5 mg by mouth daily.    saxagliptin HCl (ONGLYZA) 2.5 MG TABS tablet TAKE 1 TABLET(2.5 MG) BY MOUTH DAILY    simvastatin (ZOCOR) 20 MG tablet TAKE 1 TABLET(20 MG) BY MOUTH AT BEDTIME    tamsulosin (FLOMAX) 0.4 MG CAPS capsule Take 1 capsule (0.4 mg total) by mouth 2 (two) times daily after a meal. For urinary urgency or weak stream. (Patient taking differently: Take  0.4 mg by mouth daily. For urinary urgency or weak stream.) 03/18/2022: Pt believes he is taking it once daily   clotrimazole (LOTRIMIN) 1 % cream Apply 1 application topically 2 (two) times daily as needed (jock itch). (Patient not taking: Reported on 08/10/2021) 09/07/2021: prn   valACYclovir (VALTREX) 1000 MG tablet TAKE 2 TABLETS BY MOUTH AT ONSET OF COLD SORE. REPEAT ONCE IN 12 HOURS (4 TABS/COURSE) (Patient not taking: Reported on 09/07/2021) 09/07/2021: prn   No facility-administered encounter medications on file as of 03/18/2022.   Allergies  Allergen Reactions   Ace Inhibitors Cough   Statins Cough    ROS: The patient denies anorexia, fever, headaches, vision loss, decreased hearing, ear pain, hoarseness, chest pain, palpitations, dizziness, syncope, dyspnea on exertion, cough, swelling, nausea, vomiting, diarrhea, constipation, abdominal pain, melena, hematochezia, hematuria, incontinence, weakened urine stream, dysuria, numbness, tingling, weakness, tremor, depression, anxiety, abnormal bleeding/bruising, or enlarged lymph nodes.    Up 3-4 times/night to void, and every 2-3 hours during the day. This is tolerable, managed by urologist. +ED--sildenafil no longer effective, some decreased libido. (Urology addresses) Seasonal allergies, not flaring now. GERD is controlled with nightly PPI.   R knee pain only after walking a mile. 10# weight gain   PHYSICAL EXAM:  BP 130/70   Pulse (!) 56   Ht '6\' 3"'$  (1.905 m)   Wt 243 lb 6.4 oz (110.4 kg)   BMI 30.42 kg/m   Wt Readings from Last 3 Encounters:  03/18/22 243 lb 6.4 oz (110.4 kg)  09/07/21 233 lb 3.2 oz (105.8 kg)  08/10/21 235 lb (106.6 kg)  01/2020 Wt 270# 12.8 oz  General Appearance:    Alert, cooperative, no distress, appears stated age     Head:    Normocephalic, without obvious abnormality, atraumatic     Eyes:    PERRL, conjunctiva/corneas clear, EOM's intact, fundi benign     Ears:    Normal TM's and external ear canals.  WHSS R lower earlobe  Nose:    Normal, no drainage or sinus tenderness   Throat:    Normal mucosa  Neck:    Supple, no lymphadenopathy; thyroid: no enlargement/ tenderness/nodules; no carotid bruit or JVD     Back:    Spine nontender, no curvature, ROM normal, no CVA tenderness   Lungs:    Clear to auscultation bilaterally without wheezes, rales or ronchi; respirations unlabored     Chest Wall:    No tenderness or deformity     Heart:    Regular rhythm, S1 and S2 normal, no murmur, rub or gallop. Bradycardic, frequent ectopy, mostly sounds like bigeminy     Breast Exam:    No nipple tenderness,  discharge, no breast masses     Abdomen:    Soft, non-tender, nondistended, normoactive bowel sounds, no masses, no hepatosplenomegaly. WHSS.  Genitalia:    Deferred to urologist     Rectal:    Deferred to urologist    Extremities:    No clubbing, cyanosis or edema  Pulses:    2+ and symmetric all extremities     Skin:    Skin is dry/flaky, especially on lower legs; no rashes or lesions on legs/feet.  EIC starting to recur within scar at L upper back.   Skin folds are normal at the groin (minimal slim line of erythema only). Slight rash on L>R buttocks--slighty raised, pink, no satellite lesions or central clearing. No flaking.  Lymph nodes:    Cervical, supraclavicular nodes normal     Neurologic:    Normal strength, sensation and gait; reflexes 2+ and symmetric throughout                        Psych:   Normal mood, affect, hygiene and grooming        Diabetic foot exam-- L 2nd toenail is discolored (black--chronic per pt, since an injury). Long, thickened toenails bilaterally  Lab Results  Component Value Date   HGBA1C 7.9 (A) 03/18/2022   He had labs done 02/26/22 for nephro, results reviewed.  Had urine alb/Cr, u/a, Cr 1.86. Had c-met, CBC. Labs are scanned in.   ASSESSMENT/PLAN:  Annual physical exam - Plan: POCT Urinalysis DIP (Proadvantage Device)  Medicare annual wellness visit,  subsequent  Controlled type 2 diabetes mellitus with stage 3 chronic kidney disease, with long-term current use of insulin (HCC) - Worsening control, A1c now 7.9, with wt gain.  Would benefit from GLP or mounjaro, in place of onglyza and hope to lessen insulin.  Pt agrees to endo consult - Plan: HgB A1c, dapagliflozin propanediol (FARXIGA) 10 MG TABS tablet, saxagliptin HCl (ONGLYZA) 2.5 MG TABS tablet, Ambulatory referral to Endocrinology  Type 2 diabetes mellitus with hyperglycemia, with long-term current use of insulin (HCC) - Fasting sugars above goal, and evening sugars much higher, related to some dietary noncompliance. Increase insulin to 22U, refer to endo for med changes - Plan: Ambulatory referral to Endocrinology  CKD stage 3 due to type 2 diabetes mellitus ([place]) - under care of nephro  Essential hypertension, benign - well controlled on current regimen.  Encouraged low Na diet, daily exercise and wt loss - Plan: amLODipine (NORVASC) 10 MG tablet  Anemia due to vitamin B12 deficiency, unspecified B12 deficiency type - cont daily MVI (has had normal level while on MVI)  Gastroesophageal reflux disease without esophagitis - doing well on once daily PPI.  Reviewed diet, rec wt loss  Aortic atherosclerosis (HCC) - cont statin  Secondary hyperparathyroidism of renal origin ([place]) - managed by nephro  Personal history of prostate cancer - no e/o recurrence, monitored regularly by urologist  Medication monitoring encounter  Dyslipidemia - cont statin and lowfat, low cholesterol diet - Plan: simvastatin (ZOCOR) 20 MG tablet  Erectile dysfunction, unspecified erectile dysfunction type - sildenafil no longer effective, also with some libido issues.  Briefly discussed other treatment options; if desired, can d/w urology  Aortic atherosclerosis ([place]) - incidentally noted on CT 09/2020. continue statin.    Recommended at least 30 minutes of aerobic activity at least 5 days/week,  weight-bearing exercise at least 2x/week; proper sunscreen use reviewed; healthy diet and alcohol recommendations (less than or equal to 2  drinks/day) reviewed; regular seatbelt use; changing batteries in smoke detectors. Immunization recommendations discussed--Continue yearly flu shots. Bivalent COVID vaccine recommended (declined today). Recommended RSV from pharmacy now.  Discussed Prevnar-20, can return 2 wks after RSV vaccine or we can update at his  next visit. Colon cancer screening is UTD, had Cologuard in 07/2020.   MOST form updated and reviewed.  Full Code, Full care   To get living will and healthcare power of attorney forms notarized, then give [place] copy to scan into chart.  F/u 6 months, med check, 1 year CPE/AWV  Your sugars are above goal--mainly just in the last couple of months, likely related to sweets and the holidays.  Please cut back on the sweets, and the potatoes and cornbread (which seem to raise your sugars). Please increase your insulin to 22 units every night.  Continue to monitor your blood sugars. Continue the other medications the same for now.  We discussed consult by endocrinologist to adjust your medications to make more sense and help with getting better control of your diabetes and help with your weight.  I would suggest changing your onglyza to a medicine such as Ozempic or Mounjaro, in order to get improved sugars, weight loss, and hopefully lower insulin doses (since insulin can contribute to weight gain). We are referring you to endocrinologist through [facility] in [place].  Medicare Attestation I have personally reviewed: The patient's medical and social history Their use of alcohol, tobacco or illicit drugs Their current medications and supplements The patient's functional ability including ADLs,fall risks, home safety risks, cognitive, and hearing and visual impairment Diet and physical activities Evidence for depression or mood disorders  The patient's  weight, height, BMI have been recorded in the chart.  I have made referrals, counseling, and provided education to the patient based on review of the above and I have provided the patient with a written personalized care plan for preventive services.

## 2022-03-17 NOTE — Patient Instructions (Addendum)
HEALTH MAINTENANCE RECOMMENDATIONS:  It is recommended that you get at least 30 minutes of aerobic exercise at least 5 days/week (for weight loss, you may need as much as 60-90 minutes). This can be any activity that gets your heart rate up. This can be divided in 10-15 minute intervals if needed, but try and build up your endurance at least once a week.  Weight bearing exercise is also recommended twice weekly.  Eat a healthy diet with lots of vegetables, fruits and fiber.  "Colorful" foods have a lot of vitamins (ie green vegetables, tomatoes, red peppers, etc).  Limit sweet tea, regular sodas and alcoholic beverages, all of which has a lot of calories and sugar.  Up to 2 alcoholic drinks daily may be beneficial for men (unless trying to lose weight, watch sugars).  Drink a lot of water.  Sunscreen of at least SPF 30 should be used on all sun-exposed parts of the skin when outside between the hours of 10 am and 4 pm (not just when at beach or pool, but even with exercise, golf, tennis, and yard work!)  Use a sunscreen that says "broad spectrum" so it covers both UVA and UVB rays, and make sure to reapply every 1-2 hours.  Remember to change the batteries in your smoke detectors when changing your clock times in the spring and fall.  Carbon monoxide detectors are recommended for your home.  Use your seat belt every time you are in a car, and please drive safely and not be distracted with cell phones and texting while driving.    Mr. Dehoyos , Thank you for taking time to come for your Medicare Wellness Visit. I appreciate your ongoing commitment to your health goals. Please review the following plan we discussed and let me know if I can assist you in the future.   This is a list of the screening recommended for you and due dates:  Health Maintenance  Topic Date Due   Zoster (Shingles) Vaccine (1 of 2) Never done   Medicare Annual Wellness Visit  11/30/2012   DTaP/Tdap/Td vaccine (3 - Td or  Tdap) 07/26/2018   COVID-19 Vaccine (3 - Moderna risk series) 09/18/2020   Hemoglobin A1C  03/06/2022   Complete foot exam   03/09/2022   Eye exam for diabetics  05/05/2022   Yearly kidney function blood test for diabetes  09/04/2022   Yearly kidney health urinalysis for diabetes  02/27/2023   Cologuard (Stool DNA test)  08/01/2023   Pneumonia Vaccine  Completed   Flu Shot  Completed   Hepatitis C Screening: USPSTF Recommendation to screen - Ages 18-79 yo.  Completed   HPV Vaccine  Aged Out   Colon Cancer Screening  Discontinued   Consider RSV vaccine (you must get this from the pharmacy. We also discussed getting a Prevnar-20 vaccine.  The RSV should be gotten first (due to the seasonal nature of the illness). You can wait until your next visit with me, or get from the pharmacy (at least 2 weeks after the other vaccine).  Please schedule your routine diabetic eye exam (due in March).  Please bring [place] copies of your Living Will and [place] once completed and notarized so that it can be scanned into your medical chart.  Please try and get some additional aerobic exercise daily--either increase your time, or do a separate 10-15 minutes of different exercises (can be indoors). Weights only need to be 2-3 times per week.   Your  sugars are above goal--mainly just in the last couple of months, likely related to sweets and the holidays.  Please cut back on the sweets, and the potatoes and cornbread (which seem to raise your sugars). Please increase your insulin to 22 units every night.  Continue to monitor your blood sugars. Continue the other medications the same for now.  We discussed consult by endocrinologist to adjust your medications to make more sense and help with getting better control of your diabetes and help with your weight.  I would suggest changing your onglyza to a medicine such as Ozempic or Mounjaro, in order to get improved sugars, weight loss, and  hopefully lower insulin doses (since insulin can contribute to weight gain). We are referring you to endocrinologist through [facility] in [place].  You can show the rash on your buttocks to the dermatologist when you go next.  It doesn't look like yeast to me.  You can try some hydrocortisone cream if needed for itching.  If it worsens, then you can keep using the lotrimin (it doesn't appear fungal on today's exam).

## 2022-03-18 ENCOUNTER — Encounter: Payer: Self-pay | Admitting: Family Medicine

## 2022-03-18 ENCOUNTER — Ambulatory Visit: Payer: Medicare PPO | Admitting: Family Medicine

## 2022-03-18 VITALS — BP 130/70 | HR 56 | Ht 75.0 in | Wt 243.4 lb

## 2022-03-18 DIAGNOSIS — Z0000 Encounter for general adult medical examination without abnormal findings: Secondary | ICD-10-CM | POA: Diagnosis not present

## 2022-03-18 DIAGNOSIS — N2581 Secondary hyperparathyroidism of renal origin: Secondary | ICD-10-CM

## 2022-03-18 DIAGNOSIS — N183 Chronic kidney disease, stage 3 unspecified: Secondary | ICD-10-CM

## 2022-03-18 DIAGNOSIS — Z794 Long term (current) use of insulin: Secondary | ICD-10-CM | POA: Diagnosis not present

## 2022-03-18 DIAGNOSIS — Z8546 Personal history of malignant neoplasm of prostate: Secondary | ICD-10-CM | POA: Diagnosis not present

## 2022-03-18 DIAGNOSIS — K219 Gastro-esophageal reflux disease without esophagitis: Secondary | ICD-10-CM

## 2022-03-18 DIAGNOSIS — I10 Essential (primary) hypertension: Secondary | ICD-10-CM | POA: Diagnosis not present

## 2022-03-18 DIAGNOSIS — E1122 Type 2 diabetes mellitus with diabetic chronic kidney disease: Secondary | ICD-10-CM | POA: Diagnosis not present

## 2022-03-18 DIAGNOSIS — I700 Atherosclerosis of aorta: Secondary | ICD-10-CM

## 2022-03-18 DIAGNOSIS — D519 Vitamin B12 deficiency anemia, unspecified: Secondary | ICD-10-CM

## 2022-03-18 DIAGNOSIS — Z5181 Encounter for therapeutic drug level monitoring: Secondary | ICD-10-CM | POA: Diagnosis not present

## 2022-03-18 DIAGNOSIS — N529 Male erectile dysfunction, unspecified: Secondary | ICD-10-CM

## 2022-03-18 DIAGNOSIS — E1165 Type 2 diabetes mellitus with hyperglycemia: Secondary | ICD-10-CM

## 2022-03-18 DIAGNOSIS — E785 Hyperlipidemia, unspecified: Secondary | ICD-10-CM

## 2022-03-18 LAB — POCT URINALYSIS DIP (PROADVANTAGE DEVICE)
Bilirubin, UA: NEGATIVE
Blood, UA: NEGATIVE
Glucose, UA: >=1000 mg/dL — AB
Ketones, POC UA: NEGATIVE mg/dL
Leukocytes, UA: NEGATIVE
Nitrite, UA: NEGATIVE
Protein Ur, POC: NEGATIVE mg/dL
Specific Gravity, Urine: 1.01
Urobilinogen, Ur: 0.2
pH, UA: 6 (ref 5.0–8.0)

## 2022-03-18 LAB — POCT GLYCOSYLATED HEMOGLOBIN (HGB A1C): Hemoglobin A1C: 7.9 % — AB (ref 4.0–5.6)

## 2022-03-18 MED ORDER — AMLODIPINE BESYLATE 10 MG PO TABS: ORAL_TABLET | ORAL | 1 refills | Status: DC

## 2022-03-18 MED ORDER — SIMVASTATIN 20 MG PO TABS: 20.0000 mg | ORAL_TABLET | Freq: Every day | ORAL | 1 refills | Status: DC

## 2022-03-18 MED ORDER — DAPAGLIFLOZIN PROPANEDIOL 10 MG PO TABS: ORAL_TABLET | ORAL | 1 refills | Status: DC

## 2022-03-18 MED ORDER — SAXAGLIPTIN HCL 2.5 MG PO TABS: ORAL_TABLET | ORAL | 2 refills | Status: DC

## 2022-03-22 ENCOUNTER — Encounter: Payer: Self-pay | Admitting: Podiatry

## 2022-03-22 ENCOUNTER — Ambulatory Visit: Payer: Medicare PPO | Admitting: Podiatry

## 2022-03-22 DIAGNOSIS — E1122 Type 2 diabetes mellitus with diabetic chronic kidney disease: Secondary | ICD-10-CM

## 2022-03-22 DIAGNOSIS — B351 Tinea unguium: Secondary | ICD-10-CM | POA: Diagnosis not present

## 2022-03-22 DIAGNOSIS — N183 Chronic kidney disease, stage 3 unspecified: Secondary | ICD-10-CM

## 2022-03-22 DIAGNOSIS — M79675 Pain in left toe(s): Secondary | ICD-10-CM

## 2022-03-22 DIAGNOSIS — Z794 Long term (current) use of insulin: Secondary | ICD-10-CM | POA: Diagnosis not present

## 2022-03-22 DIAGNOSIS — M79674 Pain in right toe(s): Secondary | ICD-10-CM | POA: Diagnosis not present

## 2022-03-22 NOTE — Progress Notes (Signed)
  Subjective:  Patient ID: James Glover, male    DOB: 28-Jun-1945,   MRN: [id number]  No chief complaint on file.   77 y.o. male presents for concern of thickened elongated and painful nails that are difficult to trim. Requesting to have them trimmed today. Denies any burning or tingling in her feet. Patient is diabetic and last A1c was 6.9.   . Denies any other pedal complaints. Denies n/v/f/c.   PCP: [name] MD   Past Medical History:  Diagnosis Date   CKD (chronic kidney disease), stage III [facility])    nephrologist-- [place]--- per lov note 03-08-2018 in epic, stable (baseline Cr 1.5 - 2)   Dyslipidemia    Erectile dysfunction    First degree heart block    GERD (gastroesophageal reflux disease)    Hemorrhoids    internal and external   History of nuclear stress test    05-26-2009 (by dr Rollene Fare due to HTN and DM2)--- normal study w/ no ischemia,  normal LV function and wall motion , ef 70%   Hyperplasia of prostate with lower urinary tract symptoms (LUTS)    Hypertension    Hypogonadism male    Iron deficiency    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 Tresa Moore   Prostate cancer [facility]) urologist-  dr t. Tresa Moore  oncologsit-  dr Tammi Klippel   dx 05-30-2018-- Stage T1c, Gleason 4+5, High Risk   Renal cyst, left    followed by dr t. Tresa Moore--  chronic noncomplex   Skin cancer 11/2021   basal cell, R ear   Type 2 diabetes mellitus ([place])    followed by pcp   Wears partial dentures    upper    Objective:  Physical Exam: Vascular: DP/PT pulses 2/4 bilateral. CFT <3 seconds. Normal hair growth on digits. No edema.  Skin. No lacerations or abrasions bilateral feet. Nails 1-5 are thickened discolored and elongated with subungual debris.  Musculoskeletal: MMT 5/5 bilateral lower extremities in DF, PF, Inversion and Eversion. Deceased ROM in DF of ankle joint.  Neurological: Sensation intact to light touch.   Assessment:   No diagnosis  found.     Plan:  Patient was evaluated and treated and all questions answered. -Discussed and educated patient on diabetic foot care, especially with  regards to the vascular, neurological and musculoskeletal systems.  -Stressed the importance of good glycemic control and the detriment of not  controlling glucose levels in relation to the foot. -Discussed supportive shoes at all times and checking feet regularly.  -Mechanically debrided all nails 1-5 bilateral using sterile nail nipper and filed with dremel without incident  -Answered all patient questions -Patient to return  in 3 months for at risk foot care -Patient advised to call the office if any problems or questions arise in the meantime.   [name], DPM

## 2022-04-05 ENCOUNTER — Telehealth: Payer: Self-pay | Admitting: Family Medicine

## 2022-04-05 NOTE — Telephone Encounter (Signed)
Called pharmacy and they were able to fill, patient picked up.

## 2022-04-05 NOTE — Telephone Encounter (Signed)
Pt wife called and says the pharmacy is saying they do not see a refill for his farxiga, I confirmed to her it was sent in 03/18/22 for 90 tablets with 1 refill and she says she spoke with a live person at the [facility] pharmacy in [place] and they do not see the prescription or the refill. I advised for her to call them and let them know it was confirmed 03/18/22 at 2:34 pm. But she asks if you can give her a call.

## 2022-04-15 ENCOUNTER — Other Ambulatory Visit: Payer: Self-pay | Admitting: *Deleted

## 2022-04-15 DIAGNOSIS — I10 Essential (primary) hypertension: Secondary | ICD-10-CM

## 2022-04-15 DIAGNOSIS — N183 Chronic kidney disease, stage 3 unspecified: Secondary | ICD-10-CM

## 2022-04-17 ENCOUNTER — Other Ambulatory Visit: Payer: Self-pay | Admitting: Family Medicine

## 2022-04-17 DIAGNOSIS — I10 Essential (primary) hypertension: Secondary | ICD-10-CM

## 2022-04-22 ENCOUNTER — Telehealth: Payer: Self-pay

## 2022-04-22 ENCOUNTER — Ambulatory Visit: Payer: Medicare PPO

## 2022-04-22 NOTE — Progress Notes (Signed)
Patient ID: James Glover, male   DOB: [birth year], 77 y.o.   MRN: [id number]  Care Management & Coordination Services Pharmacy Team  Reason for Encounter: Chart prep for initial encounter on 04/29/22 @ 11 am in office with [name]. Pharm D.  Have you seen any other providers since your last visit?Patient reports none  Any changes in your medications or health? Patient reports not over several months.  Any side effects from any medications? Patient reports none that he notices, he does have sinus issues and it causes his throat to itch and him to cough from draining.  Any concerns about your health right now? Patient reports he is doing fairly well no concerns.  Has your provider asked that you check blood pressure, blood sugar, or follow special diet at home? Patient reports he has a blood pressure cuff used to check regularly but pressures are mostly fine so does not check now as much. He states he does however check his sugars daily. Advised him to bring with him his cuff to the appointment as well as a log of his readings for sugars he was in agreement  Do you get any type of exercise on a regular basis? Patient reports he does go to a walking track every day and does a mile to a mile and a half. He also does his PT exercises for about 20 min each day.  Can you think of a goal you would like to reach for your health? Patient reports one  Do you have any problems getting your medications? Patient reports he is happy with [facility], He and wife state that the [place] are expensive more tan 100.00 for 3 month supply. Patient reports they have tried in the past for financial assistance but were denied due to income.  Is there anything that you would like to discuss during the appointment? Patient reports none  Patient aware to bring blood pressure cuff, medications that do not need refrigeration and supplements to appointment    Chart review:  Recent office visits:  03/18/22  [name], MD - Patient presented for Annual physical exam and other concerns. Changed Simvastatin  Recent consult visits:  03/22/22 [name], DPM - Patient presented for pain due to onychomycosis of toenails of both feet. No medication changes.  01/06/22 [name] (Dermatology) - Patient presented for wound check. No medication changes.  11/16/21 [name], DPM - Patient presented for pain due to onychomycosis of toenails of both feet. No medication changes.  Hospital visits:  None in previous 6 months     Fill History : AMLODIPINE BESYLATE 10MG TABLETS 04/17/2022 90   FARXIGA 10MG TABLETS 04/05/2022 90   FUROSEMIDE 20MG TABLETS 03/16/2022 60   ACCU-CHEK GUIDE TEST STRIPS 100S 02/12/2022 90   TRESIBA FLEXTOUCH PEN (U-100)INJ3ML 03/31/2022 40   B-D NANO 2ND GEN PEN NDL 32GX4MMGRN 04/18/2022 90   OMEPRAZOLE DR 40 MG CAPSULE 02/15/2022 90   oxybutynin chloride 5 mg tablet 04/20/2022 90   SAXAGLIPTIN 2.5MG TABLETS 03/19/2022 90   SIMVASTATIN 20MG TABLETS 03/18/2022 90   TAMSULOSIN 0.4MG CAPSULES 03/22/2022 90   VALACYCLOVIR 1GM TABLETS 08/10/2021 14    Star Rating Drugs:  Farxiga 10 mg - Last filled 04/05/22 90 DS at [facility] Saxagliptin (Onglyza) 2.5 mg - Last filled 03/19/22 90 DS at [facility] Simvastatin 20 mg - Last filled 03/18/22 90 DS at [facility]- 04/29/22    [place] Clinical Pharmacist Assistant  [phone]   

## 2022-04-27 NOTE — Progress Notes (Signed)
Care Management & Coordination Services Pharmacy Note  04/27/2022 Name:  James Glover MRN:  [id number] DOB:  1945/12/24  Summary: A1C elevated > 7.0, home sugar logs showing controlled fasting readings since 03/22/22 BP at goal <130/80 LDL elevated >70  Recommendations/Changes made from today's visit: -Continue current diabetic regimen, cannot afford newer injectables (Ozempic,mounjaro,trulicity) and does not qualify for PAP -Be mindful of salt and caffeine intake -Limit fried, fatty foods in the diet to achieve LDL <70. If not at goal at next check, may need to adjust statin therapy or consider addition of zetia  Follow up plan: DM review call in 3 months Pharmacist visit in August   Subjective: James Glover is an 77 y.o. year old male who is a primary patient of [name], MD.  The care coordination team was consulted for assistance with disease management and care coordination needs.    Engaged with patient face to face for initial visit.  Recent office visits: 03/18/22 [name], MD - Patient presented for Annual physical exam and other concerns. Changed Simvastatin   Recent consult visits: 03/22/22 [name], DPM - Patient presented for pain due to onychomycosis of toenails of both feet. No medication changes.   01/06/22 [name] (Dermatology) - Patient presented for wound check. No medication changes.   11/16/21 [name], DPM - Patient presented for pain due to onychomycosis of toenails of both feet. No medication changes.  Hospital visits: None in previous 6 months   Objective:  Lab Results  Component Value Date   CREATININE 1.85 (H) 09/03/2021   BUN 29 (H) 09/03/2021   EGFR 38 (L) 09/03/2021   GFRNONAA 44 (L) 01/12/2021   GFRAA 47 (L) 08/13/2019   NA 139 09/03/2021   K 4.6 09/03/2021   CALCIUM 9.2 09/03/2021   CO2 24 09/03/2021   GLUCOSE 94 09/03/2021    Lab Results  Component Value Date/Time   HGBA1C 7.9 (A) 03/18/2022 02:16 PM    HGBA1C 7.1 (H) 09/03/2021 08:28 AM   HGBA1C 6.8 (A) 05/28/2021 03:04 PM   HGBA1C 6.2 (H) 01/12/2021 03:22 PM   MICROALBUR 14 05/26/2021 12:00 AM   MICROALBUR 8.8 02/10/2021 12:00 AM    Last diabetic Eye exam:  Lab Results  Component Value Date/Time   HMDIABEYEEXA No Retinopathy 05/04/2021 12:00 AM    Last diabetic Foot exam: No results found for: "HMDIABFOOTEX"   Lab Results  Component Value Date   CHOL 156 09/03/2021   HDL 50 09/03/2021   LDLCALC 90 09/03/2021   TRIG 83 09/03/2021   CHOLHDL 3.1 09/03/2021       Latest Ref Rng & Units 09/03/2021    8:28 AM 11/19/2020    9:37 AM 11/11/2020   10:22 AM  Hepatic Function  Total Protein 6.0 - 8.5 g/dL 7.0  6.8  6.4   Albumin 3.7 - 4.7 g/dL 4.1  3.2    AST 0 - 40 IU/L '15  11  11   '$ ALT 0 - 44 IU/L '15  7  13   '$ Alk Phosphatase 44 - 121 IU/L 129  67    Total Bilirubin 0.0 - 1.2 mg/dL 0.5  0.3  0.4     Lab Results  Component Value Date/Time   TSH 2.060 09/03/2021 08:28 AM   TSH 3.010 08/19/2020 09:40 AM       Latest Ref Rng & Units 09/03/2021    8:28 AM 01/12/2021    3:22 PM 11/19/2020    9:37 AM  CBC  WBC 3.4 - 10.8 x10E3/uL 6.8  7.5  8.1   Hemoglobin 13.0 - 17.7 g/dL 13.3  10.0  9.4   Hematocrit 37.5 - 51.0 % 41.1  32.7  29.0   Platelets 150 - 450 x10E3/uL 155  263  297     Lab Results  Component Value Date/Time   VITAMINB12 986 09/03/2021 08:28 AM   VITAMINB12 686 08/19/2020 09:40 AM    Clinical ASCVD: No  The 10-year ASCVD risk score (Arnett DK, et al., 2019) is: 45.5%   Values used to calculate the score:     Age: 77 years     Sex: Male     Is Non-Hispanic African American: No     Diabetic: Yes     Tobacco smoker: No     Systolic Blood Pressure: [id number] mmHg     Is BP treated: Yes     HDL Cholesterol: 50 mg/dL     Total Cholesterol: 156 mg/dL       03/18/2022    1:37 PM 09/07/2021    2:32 PM 05/28/2021    2:45 PM  Depression screen PHQ 2/9  Decreased Interest 0 0 0  Down, Depressed, Hopeless 0 0 0  PHQ - 2  Score 0 0 0     Social History   Tobacco Use  Smoking Status Never  Smokeless Tobacco Never   BP Readings from Last 3 Encounters:  03/18/22 130/70  09/07/21 128/68  08/10/21 120/60   Pulse Readings from Last 3 Encounters:  03/18/22 (!) 56  09/07/21 60  08/10/21 63   Wt Readings from Last 3 Encounters:  03/18/22 243 lb 6.4 oz (110.4 kg)  09/07/21 233 lb 3.2 oz (105.8 kg)  08/10/21 235 lb (106.6 kg)   BMI Readings from Last 3 Encounters:  03/18/22 30.42 kg/m  09/07/21 29.34 kg/m  08/10/21 29.37 kg/m    Allergies  Allergen Reactions   Ace Inhibitors Cough   Statins Cough    Medications Reviewed Today     Reviewed by [name], DPM (Physician) on 03/22/22 at 10  Med List Status: <None>   Medication Order Taking? Sig Documenting Provider Last Dose Status Informant  ACCU-CHEK GUIDE test strip [id number] No USE AS DIRECTED [name], MD Taking Active   amLODipine (NORVASC) 10 MG tablet [id number]  TAKE 1 TABLET(10 MG) BY MOUTH DAILY [name], MD  Active   aspirin EC 81 MG tablet [id number] No Take 81 mg by mouth daily. Swallow whole. [provider] Taking Active   BD PEN NEEDLE NANO 2ND GEN 32G X 4 MM MISC [id number] No USE AS NEEDED AS DIRECTED [name], MD Taking Active   clotrimazole (LOTRIMIN) 1 % cream [id number] No Apply 1 application topically 2 (two) times daily as needed (jock itch).  Patient not taking: Reported on 08/10/2021   [provider] Not Taking Active Spouse/Significant Other           Med Note James Glover Sep 07, 2021  2:30 PM) prn  dapagliflozin propanediol (FARXIGA) 10 MG TABS tablet [id number]  TAKE 1 TABLET BY MOUTH EVERY DAY BEFORE James Levee, MD  Active   furosemide (LASIX) 20 MG tablet [id number] No Take 20 mg by mouth. [provider] Taking Active   insulin degludec (TRESIBA FLEXTOUCH) 100 UNIT/ML FlexTouch Pen [id number] No ADMINISTER 18 TO 20 UNITS UNDER THE SKIN AT BEDTIME [name], MD Taking Active  Med Note [facility] Mar 18, 2022  1:37 PM) 20 units  Multiple Vitamins-Minerals (MULTIVITAMIN WITH MINERALS) tablet [id number] No Take 1 tablet by mouth daily. [provider] Taking Active Spouse/Significant Other           Med Note James Glover Mar 09, 2021  1:39 PM) Centrum Silver Men's 50+  Omega-3 Fatty Acids (FISH OIL PO) [id number] No Take 1,400 mg by mouth daily. [provider] Taking Active Spouse/Significant Other  omeprazole (PRILOSEC) 40 MG capsule [id number] No TAKE 1 CAPSULE(40 MG) BY MOUTH IN THE MORNING AND AT BEDTIME [name], MD Taking Active            Med Note [facility], [facility]   Thu Mar 18, 2022  2:13 PM) Takes once daily at bedtime  oxybutynin (DITROPAN) 5 MG tablet [id number] No Take 5 mg by mouth daily. [provider] Taking Active Spouse/Significant Other  saxagliptin HCl (ONGLYZA) 2.5 MG TABS tablet [id number]  TAKE 1 TABLET(2.5 MG) BY MOUTH DAILY [name], MD  Active   simvastatin (ZOCOR) 20 MG tablet [id number]  Take 1 tablet (20 mg total) by mouth daily. [name], MD  Active   tamsulosin [facility]) 0.4 MG CAPS capsule [id number] No Take 1 capsule (0.4 mg total) by mouth 2 (two) times daily after a meal. For urinary urgency or weak stream.  Patient taking differently: Take 0.4 mg by mouth daily. For urinary urgency or weak stream.   Bruning, Ashlyn, PA-C Taking Active            Med Note ([facility], [facility]   Thu Mar 18, 2022  2:13 PM) Pt believes he is taking it once daily  valACYclovir (VALTREX) 1000 MG tablet [id number] No TAKE 2 TABLETS BY MOUTH AT ONSET OF COLD SORE. REPEAT ONCE IN 12 HOURS (4 TABS/COURSE)  Patient not taking: Reported on 09/07/2021   [name], MD Not Taking Active            Med Note James Glover Sep 07, 2021  2:31 PM) prn            SDOH:  (Social Determinants of Health) assessments and interventions performed: Yes SDOH Interventions    Flowsheet Row  Patient Outreach Telephone from 11/07/2020 in [place] Interventions   Food Insecurity Interventions Intervention Not Indicated  Transportation Interventions Intervention Not Indicated       Medication Assistance: None required.  Patient affirms current coverage meets needs.  Medication Access: Within the past 30 days, how often has patient missed a dose of medication? None Is a pillbox or other method used to improve adherence? Yes  Factors that may affect medication adherence? no barriers identified Are meds synced by current pharmacy? No  Are meds delivered by current pharmacy? No  Does patient experience delays in picking up medications due to transportation concerns? No   [facility] Reviewed: Is patient disadvantaged to use [facility]?: No  Current Rx insurance plan: [employer] Name and location of Current pharmacy:  [facility] [place] - [address] [place] [address] Phone: [phone] Fax: [phone]  [facility] services reviewed with patient today?: No  Patient requests to transfer care to [facility]?: No  Reason patient declined to change pharmacies: Not mentioned at this visit  Compliance/Adherence/Medication fill history: Care Gaps: COVID Vaccine: last  07/2020  Star-Rating Drugs: Wilder Glade '10mg'$  PDC 97% Onglyza 2.'5mg'$  PDC 49% Simvastatin '20mg'$  PDC 100%   Assessment/Plan   Hypertension (BP goal <130/80) -Not ideally controlled-ACEI indicated with DM, but intolerant -Current treatment: Amlodipine '10mg'$  1 qd Appropriate, Effective, Safe, Accessible Lasix '20mg'$  1 qd Appropriate, Effective, Safe, Accessible -Medications previously tried: HCTZ, Losartan, Bystolic, Ramipril -Current home readings: 130/70 -Current dietary habits: mindful of salt intake and caffeine -Current exercise habits: walks for 1 hr daily and does exercises and  weight lifting for 1 hr -Denies hypotensive/hypertensive symptoms -Educated on BP goals and benefits of medications for prevention of heart attack, stroke and kidney damage; Daily salt intake goal < 2300 mg; Exercise goal of 150 minutes per week; Importance of home blood pressure monitoring; Proper BP monitoring technique; -Counseled to monitor BP at home once weekly, document, and provide log at future appointments -Recommended to continue current medication  Hyperlipidemia: (LDL goal < 70) -Uncontrolled -Current treatment: Simvastatin '20mg'$  Appropriate, Query Effective, Safe, Accessible Fish Oil '1400mg'$  qd Appropriate, Effective, Safe, Accessible -Medications previously tried: None  -Current dietary patterns: mostly fish and chicken but does fry it, sometimes sausage -Current exercise habits: see above -Educated on Cholesterol goals;  Benefits of statin for ASCVD risk reduction; Importance of limiting foods high in cholesterol; Exercise goal of 150 minutes per week; -Counseled on diet and exercise extensively Recommended to continue current medication -Future consideration: change to atorvastatin '40mg'$  to achieve goal of <70  Diabetes (A1c goal <7%) -Uncontrolled -Current medications: Farxiga '10mg'$  1 qd Appropriate, Effective, Safe, Accessible Tresiba 100 u/mL 18-22 units into skin at bedtime Appropriate, Effective, Safe, Accessible Onglyza 2.'5mg'$  1 qd Appropriate, Effective, Safe, Accessible -Medications previously tried: Glipizide, Glyburide, Actos -Current home glucose readings fasting glucose: <130, lowest 79 today post prandial glucose: not checking -Denies hypoglycemic/hyperglycemic symptoms -Current meal patterns:  breakfast: a bowl of fruit  drinks: sugar free [place] -Current exercise: see above -No ACEI usage but previous history of intolerance -Educated on A1c and blood sugar goals; Complications of diabetes including kidney damage, retinal damage, and  cardiovascular disease; Exercise goal of 150 minutes per week; Proper insulin injection technique; Prevention and management of hypoglycemic episodes; Benefits of routine self-monitoring of blood sugar; -Counseled to check feet daily and get yearly eye exams -Counseled on diet and exercise extensively -Discussed GLP injections, too expensive with patient's current coverage and patient does not qualify for PAP, will check next year with new insurance coverage. -Continue current medication therapy  BPH (Goal: Reduce urinary frequency) -Not assessed today -Current treatment  Tamsulosin 0.'4mg'$  BID Appropriate, Effective, Safe, Accessible Oxybutynin '5mg'$  BID Appropriate, Effective, Safe, Accessible  GERD (Goal: Reduce indigestion/acid reflux and prevent ulcers/GI bleed) -Not assessed today -Current treatment  Omeprazole '40mg'$  1 qd Appropriate, Effective, Safe, Accessible       [place] Clinical Pharmacist [phone]

## 2022-04-27 NOTE — Patient Instructions (Signed)

## 2022-04-28 ENCOUNTER — Telehealth: Payer: Self-pay

## 2022-04-28 DIAGNOSIS — L570 Actinic keratosis: Secondary | ICD-10-CM | POA: Diagnosis not present

## 2022-04-28 NOTE — Progress Notes (Signed)
Patient ID: James Glover, male   DOB: [birth year], 77 y.o.   MRN: [id number]  Care Management & Coordination Services Pharmacy Team  Reason for Encounter: Appointment Reminder  Contacted patient to confirm in office appointment with [name], PharmD on 04/29/22 at 33. Spoke with patient on 04/28/2022       Star Rating Drugs:  Farxiga 10 mg - Last filled 04/05/22 90 DS at [facility] Saxagliptin (Onglyza) 2.5 mg - Last filled 03/19/22 90 DS at [facility] Simvastatin 20 mg - Last filled 03/18/22 90 DS at [place]: [place]- 04/29/22   [place] Clinical Pharmacist Assistant [phone]

## 2022-04-29 ENCOUNTER — Encounter: Payer: Self-pay | Admitting: Nurse Practitioner

## 2022-04-29 ENCOUNTER — Ambulatory Visit: Payer: Medicare PPO | Admitting: Nurse Practitioner

## 2022-04-29 ENCOUNTER — Ambulatory Visit: Payer: Medicare PPO

## 2022-04-29 VITALS — BP 119/70 | HR 71 | Ht 75.5 in | Wt 243.4 lb

## 2022-04-29 DIAGNOSIS — N1832 Chronic kidney disease, stage 3b: Secondary | ICD-10-CM

## 2022-04-29 DIAGNOSIS — E1122 Type 2 diabetes mellitus with diabetic chronic kidney disease: Secondary | ICD-10-CM

## 2022-04-29 DIAGNOSIS — Z794 Long term (current) use of insulin: Secondary | ICD-10-CM | POA: Diagnosis not present

## 2022-04-29 NOTE — Progress Notes (Signed)
Endocrinology Consult Note       04/29/2022, 3:11 PM   Subjective:    Patient ID: James Glover, male    DOB: [birth year].  James Glover is being seen in consultation for management of currently uncontrolled symptomatic diabetes requested by  [name], MD.   Past Medical History:  Diagnosis Date   CKD (chronic kidney disease), stage III [facility])    nephrologist-- coladonato--- per lov note 03-08-2018 in epic, stable (baseline Cr 1.5 - 2)   Dyslipidemia    Erectile dysfunction    First degree heart block    GERD (gastroesophageal reflux disease)    Hemorrhoids    internal and external   History of nuclear stress test    05-26-2009 (by dr Rollene Fare due to HTN and DM2)--- normal study w/ no ischemia,  normal LV function and wall motion , ef 70%   Hyperplasia of prostate with lower urinary tract symptoms (LUTS)    Hypertension    Hypogonadism male    Iron deficiency    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 Tresa Moore   Prostate cancer [facility]) urologist-  dr t. Tresa Moore  oncologsit-  dr Tammi Klippel   dx 05-30-2018-- Stage T1c, Gleason 4+5, High Risk   Renal cyst, left    followed by dr t. Tresa Moore--  chronic noncomplex   Skin cancer 11/2021   basal cell, R ear   Type 2 diabetes mellitus ([place])    followed by pcp   Wears partial dentures    upper    Past Surgical History:  Procedure Laterality Date   CHOLECYSTECTOMY N/A 01/14/2021   Procedure: LAPAROSCOPIC CHOLECYSTECTOMY; REMOVAL OF CHOLECYSTOSTOMY DRAIN;  Surgeon: Virl Cagey, MD;  Location: [facility];  Service: General;  Laterality: N/A;   CIRCUMCISION  age 8   CYSTOSCOPY N/A 11/03/2018   Procedure: Erlene Quan;  Surgeon: Alexis Frock, MD;  [facility];  Service: Urology;  Laterality: N/A;  NO SEEDS FOUND IN BLADDER   dental implants     IR EXCHANGE BILIARY DRAIN  12/29/2020   IR FLUORO GUIDED  NEEDLE PLC ASPIRATION/INJECTION LOC  10/24/2020   IR GUIDED DRAIN W CATHETER PLACEMENT  10/24/2020   IR GUIDED DRAIN W CATHETER PLACEMENT  10/24/2020   IR PERC CHOLECYSTOSTOMY  10/24/2020   IR US GUIDE BX ASP/DRAIN  10/24/2020   IR US GUIDE BX ASP/DRAIN  10/24/2020   IR US GUIDE BX ASP/DRAIN  10/24/2020   KNEE ARTHROSCOPY Right 11/14/2017   dr rogers '@[facility]'$    KNEE CARTILAGE SURGERY Right    right knee meniscus repair   RADIOACTIVE SEED IMPLANT N/A 11/03/2018   Procedure: RADIOACTIVE SEED IMPLANT/BRACHYTHERAPY IMPLANT;  Surgeon: Alexis Frock, MD;  Location: [facility];  Service: Urology;  Laterality: N/A;       SEEDS IMPLANTED   SPACE OAR INSTILLATION N/A 11/03/2018   Procedure: SPACE OAR INSTILLATION;  Surgeon: Alexis Frock, MD;  Location: [facility];  Service: Urology;  Laterality: N/A;    Social History   Socioeconomic History   Marital status: Married    Spouse name: Not on file   Number of children:  0   Years of education: Not on file   Highest education level: Not on file  Occupational History   Occupation: police officer--retired    Employer: [employer]  Tobacco Use   Smoking status: Never   Smokeless tobacco: Never  Vaping Use   Vaping Use: Never used  Substance and Sexual Activity   Alcohol use: Yes    Alcohol/week: 3.0 standard drinks of alcohol    Types: 3 Glasses of wine per week    Comment: 1-2 glasses of wine per week   Drug use: Never   Sexual activity: Not Currently    Comment: ED  Other Topics Concern   Not on file  Social History Narrative   Retired 06/2011.  Lives with wife, 1 cat.   Has a place on [place], spends 1-2 weeks/month there   Has 9 siblings      Updated 03/2022   Social Determinants of Health   Financial Resource Strain: Low Risk  (04/29/2022)   Overall Financial Resource Strain (CARDIA)    Difficulty of Paying Living Expenses: Not very hard  Food Insecurity: No Food Insecurity  (04/29/2022)   Hunger Vital Sign    Worried About Running Out of Food in the Last Year: Never true    Ran Out of Food in the Last Year: Never true  Transportation Needs: No Transportation Needs (04/29/2022)   PRAPARE - Hydrologist (Medical): No    Lack of Transportation (Non-Medical): No  Physical Activity: Sufficiently Active (04/29/2022)   Exercise Vital Sign    Days of Exercise per Week: 7 days    Minutes of Exercise per Session: 120 min  Stress: Not on file  Social Connections: Not on file    Family History  Problem Relation Age of Onset   Dementia Mother    Diabetes Mother    Lymphoma Father    Cancer Father        lymphoma   Hypertension Father    Diabetes Sister    Healthy Sister    Lymphoma Brother    Cancer Brother        lymphoma   Cancer Brother        skin cancer   Diabetes Brother    Diabetes Brother    Kidney disease Brother    COPD Brother    Prostate cancer Brother 47   Lymphoma Paternal Grandfather    Cancer Paternal Grandfather        lymphoma   Heart disease Neg Hx     Outpatient Encounter Medications as of 04/29/2022  Medication Sig   ACCU-CHEK GUIDE test strip USE AS DIRECTED   amLODipine (NORVASC) 10 MG tablet TAKE 1 TABLET(10 MG) BY MOUTH DAILY   aspirin EC 81 MG tablet Take 81 mg by mouth daily. Swallow whole.   BD PEN NEEDLE NANO 2ND GEN 32G X 4 MM MISC USE AS NEEDED AS DIRECTED   clotrimazole (LOTRIMIN) 1 % cream Apply 1 application  topically 2 (two) times daily as needed (jock itch).   furosemide (LASIX) 20 MG tablet Take 20 mg by mouth.   insulin degludec (TRESIBA FLEXTOUCH) 100 UNIT/ML FlexTouch Pen ADMINISTER 18 TO 20 UNITS UNDER THE SKIN AT BEDTIME   Multiple Vitamins-Minerals (MULTIVITAMIN WITH MINERALS) tablet Take 1 tablet by mouth daily.   Omega-3 Fatty Acids (FISH OIL PO) Take 1,400 mg by mouth daily.   omeprazole (PRILOSEC) 40 MG capsule TAKE 1 CAPSULE(40 MG) BY MOUTH IN THE  MORNING AND AT BEDTIME    oxybutynin (DITROPAN) 5 MG tablet Take 5 mg by mouth daily.   saxagliptin HCl (ONGLYZA) 2.5 MG TABS tablet TAKE 1 TABLET(2.5 MG) BY MOUTH DAILY (Patient taking differently: Take 2.5 mg by mouth daily. TAKE 1 TABLET(2.5 MG) BY MOUTH DAILY)   simvastatin (ZOCOR) 20 MG tablet Take 1 tablet (20 mg total) by mouth daily.   tamsulosin (FLOMAX) 0.4 MG CAPS capsule Take 1 capsule (0.4 mg total) by mouth 2 (two) times daily after a meal. For urinary urgency or weak stream. (Patient taking differently: Take 0.4 mg by mouth daily. For urinary urgency or weak stream.)   valACYclovir (VALTREX) 1000 MG tablet TAKE 2 TABLETS BY MOUTH AT ONSET OF COLD SORE. REPEAT ONCE IN 12 HOURS (4 TABS/COURSE)   [DISCONTINUED] dapagliflozin propanediol (FARXIGA) 10 MG TABS tablet TAKE 1 TABLET BY MOUTH EVERY DAY BEFORE BREAKFAST   No facility-administered encounter medications on file as of 04/29/2022.    ALLERGIES: Allergies  Allergen Reactions   Ace Inhibitors Cough   Statins Cough    VACCINATION STATUS: Immunization History  Administered Date(s) Administered   DTaP 07/25/2008   Fluad Quad(high Dose 65+) 02/21/2020   Influenza Split 11/17/2011, 12/30/2013, 12/26/2014   Influenza, High Dose Seasonal PF 01/30/2013, 11/12/2015, 12/08/2016, 01/16/2018, 12/25/2018, 02/10/2021   Influenza-Unspecified 12/16/2016, 02/12/2021, 11/29/2021   Moderna SARS-COV2 Booster Vaccination 02/21/2020, 08/21/2020   Moderna Sars-Covid-2 Vaccination 05/15/2019, 06/12/2019   Pneumococcal Conjugate-13 03/04/2014   Pneumococcal Polysaccharide-23 11/29/2009, 04/09/2015   Tdap 07/25/2008, 05/04/2021   Zoster Recombinat (Shingrix) 05/22/2021, 07/06/2021   Zoster, Live 09/05/2013    Diabetes He presents for his initial diabetic visit. He has type 2 diabetes mellitus. Onset time: Diagnosed at approx age of 21. His disease course has been improving. There are no hypoglycemic associated symptoms. Associated symptoms include polyuria. (Yeast  infection in groin) There are no hypoglycemic complications. Symptoms are stable. Diabetic complications include nephropathy. Risk factors for coronary artery disease include diabetes mellitus, male sex and hypertension. Current diabetic treatment includes insulin injections and oral agent (dual therapy). He is compliant with treatment most of the time. His weight is fluctuating minimally. He is following a generally healthy diet. Meal planning includes avoidance of concentrated sweets and ADA exchanges. He has not had a previous visit with a dietitian. He participates in exercise daily. His home blood glucose trend is fluctuating minimally. His breakfast blood glucose range is generally 90-110 mg/dl. (He presents today for his consultation with his logs showing at goal fasting glycemic profile.  His most recent A1c on 03/18/22 was 7.9%, increasing from previous A1c, but he attributes this due to the holiday season.  He monitors glucose once daily.  He drinks mostly Oswaldo Done with Splenda, eats 3 well-balanced meals per day, and rarely snacks.  He engages in routine physical activity by walking at least 1 mile per day.  He is UTD on eye exam and sees podiatrist routinely. He does have a persistent rash in his groin area, using Lotrimin cream for this.) An ACE inhibitor/angiotensin II receptor blocker is not being taken. He sees a podiatrist.Eye exam is current.     Review of systems  Constitutional: + Minimally fluctuating body weight, current Body mass index is 30.02 kg/m., no fatigue, no subjective hyperthermia, no subjective hypothermia Eyes: no blurry vision, no xerophthalmia ENT: no sore throat, no nodules palpated in throat, no dysphagia/odynophagia, no hoarseness Cardiovascular: no chest pain, no shortness of breath, no palpitations, no leg swelling Respiratory: no cough, no  shortness of breath Gastrointestinal: no nausea/vomiting/diarrhea Musculoskeletal: no muscle/joint aches Skin: +  persistent rash in groin, no hyperemia Neurological: no tremors, no numbness, no tingling, no dizziness Psychiatric: no depression, no anxiety  Objective:     BP 119/70 (BP Location: Left Arm, Patient Position: Sitting, Cuff Size: Large)   Pulse 71   Ht 6' 3.5" (1.918 m)   Wt 243 lb 6.4 oz (110.4 kg)   BMI 30.02 kg/m   Wt Readings from Last 3 Encounters:  04/29/22 243 lb 6.4 oz (110.4 kg)  03/18/22 243 lb 6.4 oz (110.4 kg)  09/07/21 233 lb 3.2 oz (105.8 kg)     BP Readings from Last 3 Encounters:  04/29/22 119/70  03/18/22 130/70  09/07/21 128/68     Physical Exam- Limited  Constitutional:  Body mass index is 30.02 kg/m. , not in acute distress, normal state of mind Eyes:  EOMI, no exophthalmos Neck: Supple Cardiovascular: RRR, no murmurs, rubs, or gallops, no edema Respiratory: Adequate breathing efforts, no crackles, rales, rhonchi, or wheezing Musculoskeletal: no gross deformities, strength intact in all four extremities, no gross restriction of joint movements Skin:  no rashes, no hyperemia Neurological: no tremor with outstretched hands    CMP ( most recent) CMP     Component Value Date/Time   NA 139 09/03/2021 0828   K 4.6 09/03/2021 0828   CL 99 09/03/2021 0828   CO2 24 09/03/2021 0828   GLUCOSE 94 09/03/2021 0828   GLUCOSE 136 (H) 01/12/2021 1522   BUN 29 (H) 09/03/2021 0828   CREATININE 1.85 (H) 09/03/2021 0828   CREATININE 1.41 (H) 11/11/2020 1022   CALCIUM 9.2 09/03/2021 0828   PROT 7.0 09/03/2021 0828   ALBUMIN 4.1 09/03/2021 0828   AST 15 09/03/2021 0828   ALT 15 09/03/2021 0828   ALKPHOS 129 (H) 09/03/2021 0828   BILITOT 0.5 09/03/2021 0828   GFRNONAA 44 (L) 01/12/2021 1522   GFRAA 47 (L) 08/13/2019 0835     Diabetic Labs (most recent): Lab Results  Component Value Date   HGBA1C 7.9 (A) 03/18/2022   HGBA1C 7.1 (H) 09/03/2021   HGBA1C 6.8 (A) 05/28/2021   MICROALBUR 14 05/26/2021   MICROALBUR 8.8 02/10/2021   MICROALBUR <4  02/13/2020     Lipid Panel ( most recent) Lipid Panel     Component Value Date/Time   CHOL 156 09/03/2021 0828   TRIG 83 09/03/2021 0828   HDL 50 09/03/2021 0828   CHOLHDL 3.1 09/03/2021 0828   CHOLHDL 2.4 02/02/2017 1205   VLDL 12 06/23/2016 1032   LDLCALC 90 09/03/2021 0828   LDLCALC 71 02/02/2017 1205   LABVLDL 16 09/03/2021 0828      Lab Results  Component Value Date   TSH 2.060 09/03/2021   TSH 3.010 08/19/2020   TSH 3.000 02/05/2019   TSH 2.580 08/31/2017   TSH 2.21 06/23/2016   TSH 1.99 04/09/2015   TSH 2.093 02/25/2014   TSH 2.021 01/30/2013   TSH 2.113 12/01/2011   TSH 1.681 08/26/2010           Assessment & Plan:   1) Type 2 diabetes mellitus with stage 3b chronic kidney disease, with long-term current use of insulin ([place])  He presents today for his consultation with his logs showing at goal fasting glycemic profile.  His most recent A1c on 03/18/22 was 7.9%, increasing from previous A1c, but he attributes this due to the holiday season.  He monitors glucose once daily.  He drinks mostly Oswaldo Done  with Splenda, eats 3 well-balanced meals per day, and rarely snacks.  He engages in routine physical activity by walking at least 1 mile per day.  He is UTD on eye exam and sees podiatrist routinely. He does have a persistent rash in his groin area, using Lotrimin cream for this.  - James Glover has currently uncontrolled symptomatic type 2 DM since 77 years of age, with most recent A1c of 7.9 %.   -Recent labs reviewed.  - I had a long discussion with him about the progressive nature of diabetes and the pathology behind its complications. -his diabetes is complicated by CKD stage 3b (sees nephrology) and he remains at a high risk for more acute and chronic complications which include CAD, CVA, CKD, retinopathy, and neuropathy. These are all discussed in detail with him.  The following Lifestyle Medicine recommendations according to [facility]) were discussed and offered to patient and he agrees to start the journey:  A. Whole Foods, Plant-based plate comprising of fruits and vegetables, plant-based proteins, whole-grain carbohydrates was discussed in detail with the patient.   A list for source of those nutrients were also provided to the patient.  Patient will use only water or unsweetened tea for hydration. B.  The need to stay away from risky substances including alcohol, smoking; obtaining 7 to 9 hours of restorative sleep, at least 150 minutes of moderate intensity exercise weekly, the importance of healthy social connections,  and stress reduction techniques were discussed. C.  A full color page of  Calorie density of various food groups per pound showing examples of each food groups was provided to the patient.  - I have counseled him on diet and weight management by adopting a carbohydrate restricted/protein rich diet. Patient is encouraged to switch to unprocessed or minimally processed complex starch and increased protein intake (animal or plant source), fruits, and vegetables. -  he is advised to stick to a routine mealtimes to eat 3 meals a day and avoid unnecessary snacks (to snack only to correct hypoglycemia).   - he acknowledges that there is a room for improvement in his food and drink choices. - Suggestion is made for him to avoid simple carbohydrates from his diet including Cakes, Sweet Desserts, Ice Cream, Soda (diet and regular), Sweet Tea, Candies, Chips, Cookies, Store Bought Juices, Alcohol in Excess of 1-2 drinks a day, Artificial Sweeteners, Coffee Creamer, and "Sugar-free" Products. This will help patient to have more stable blood glucose profile and potentially avoid unintended weight gain.  - I have approached him with the following individualized plan to manage his diabetes and patient agrees:   -He is advised to continue Tresiba 22 units SQ nightly.  -he is encouraged to continue  monitoring glucose at least once daily, before breakfast, and to call the clinic if he has readings less than 70 or above 300 for 3 tests in a row.   - he is warned not to take insulin without proper monitoring per orders. - Adjustment parameters are given to him for hypo and hyperglycemia in writing.  - he is advised to continue Onglyza 2.5 mg po daily, therapeutically suitable for patient . - his Wilder Glade will be discontinued, risk outweighs benefit for this patient (given his persistent rash in groin- concern for Fourniers gangrene). - he is not a candidate for Metformin due to concurrent renal insufficiency.  - Specific targets for  A1c; LDL, HDL, and Triglycerides were discussed with the patient.  2)  Blood Pressure /Hypertension:  his blood pressure is controlled to target.   he is advised to continue his current medications including Norvasc 10 mg p.o. daily with breakfast.  3) Lipids/Hyperlipidemia:    Review of his recent lipid panel from 09/03/21 showed controlled LDL at 90 .  he is advised to continue Simvastatin 20 mg daily at bedtime.  Side effects and precautions discussed with him.  4)  Weight/Diet:  his Body mass index is 30.02 kg/m.  -  clearly complicating his diabetes care.   he is a candidate for weight loss. I discussed with him the fact that loss of 5 - 10% of his  current body weight will have the most impact on his diabetes management.  Exercise, and detailed carbohydrates information provided  -  detailed on discharge instructions.  5) Chronic Care/Health Maintenance: -he is not on ACEI/ARB and is on Statin medications and is encouraged to initiate and continue to follow up with Ophthalmology, Dentist, Podiatrist at least yearly or according to recommendations, and advised to stay away from smoking. I have recommended yearly flu vaccine and pneumonia vaccine at least every 5 years; moderate intensity exercise for up to 150 minutes weekly; and sleep for at least 7 hours a  day.  - he is advised to maintain close follow up with [name], MD for primary care needs, as well as his other providers for optimal and coordinated care.   - Time spent in this patient care: 60 min, of which > 50% was spent in counseling him about his diabetes and the rest reviewing his blood glucose logs, discussing his hypoglycemia and hyperglycemia episodes, reviewing his current and previous labs/studies (including abstraction from other facilities) and medications doses and developing a long term treatment plan based on the latest standards of care/guidelines; and documenting his care.    Please refer to Patient Instructions for Blood Glucose Monitoring and Insulin/Medications Dosing Guide" in media tab for additional information. Please also refer to "Patient Self Inventory" in the Media tab for reviewed elements of pertinent patient history.  James Glover participated in the discussions, expressed understanding, and voiced agreement with the above plans.  All questions were answered to his satisfaction. he is encouraged to contact clinic should he have any questions or concerns prior to his return visit.     Follow up plan: - Return in about 3 months (around 07/28/2022) for Diabetes F/U with A1c in office, No previsit labs, Bring meter and logs.    Rayetta Pigg, [facility] [address] [place], [place] [address] Phone: [phone] Fax: [phone]  04/29/2022, 3:11 PM

## 2022-05-09 ENCOUNTER — Other Ambulatory Visit: Payer: Self-pay | Admitting: Family Medicine

## 2022-05-09 DIAGNOSIS — N183 Chronic kidney disease, stage 3 unspecified: Secondary | ICD-10-CM

## 2022-05-17 DIAGNOSIS — H5203 Hypermetropia, bilateral: Secondary | ICD-10-CM | POA: Diagnosis not present

## 2022-05-17 DIAGNOSIS — E119 Type 2 diabetes mellitus without complications: Secondary | ICD-10-CM | POA: Diagnosis not present

## 2022-05-17 DIAGNOSIS — H2513 Age-related nuclear cataract, bilateral: Secondary | ICD-10-CM | POA: Diagnosis not present

## 2022-05-17 DIAGNOSIS — H52203 Unspecified astigmatism, bilateral: Secondary | ICD-10-CM | POA: Diagnosis not present

## 2022-05-17 LAB — HM DIABETES EYE EXAM

## 2022-05-19 ENCOUNTER — Encounter: Payer: Self-pay | Admitting: *Deleted

## 2022-06-15 ENCOUNTER — Other Ambulatory Visit: Payer: Self-pay | Admitting: Family Medicine

## 2022-06-15 DIAGNOSIS — E1122 Type 2 diabetes mellitus with diabetic chronic kidney disease: Secondary | ICD-10-CM

## 2022-06-29 DIAGNOSIS — E669 Obesity, unspecified: Secondary | ICD-10-CM | POA: Diagnosis not present

## 2022-06-29 DIAGNOSIS — E119 Type 2 diabetes mellitus without complications: Secondary | ICD-10-CM | POA: Diagnosis not present

## 2022-06-29 DIAGNOSIS — E1122 Type 2 diabetes mellitus with diabetic chronic kidney disease: Secondary | ICD-10-CM | POA: Diagnosis not present

## 2022-06-29 DIAGNOSIS — E785 Hyperlipidemia, unspecified: Secondary | ICD-10-CM | POA: Diagnosis not present

## 2022-06-29 DIAGNOSIS — I129 Hypertensive chronic kidney disease with stage 1 through stage 4 chronic kidney disease, or unspecified chronic kidney disease: Secondary | ICD-10-CM | POA: Diagnosis not present

## 2022-06-29 DIAGNOSIS — N1831 Chronic kidney disease, stage 3a: Secondary | ICD-10-CM | POA: Diagnosis not present

## 2022-06-29 DIAGNOSIS — N179 Acute kidney failure, unspecified: Secondary | ICD-10-CM | POA: Diagnosis not present

## 2022-06-29 DIAGNOSIS — N2581 Secondary hyperparathyroidism of renal origin: Secondary | ICD-10-CM | POA: Diagnosis not present

## 2022-06-29 LAB — CBC AND DIFFERENTIAL
Hemoglobin: 12.8 — AB (ref 13.5–17.5)
Platelets: 142 10*3/uL — AB (ref 150–400)

## 2022-06-29 LAB — COMPREHENSIVE METABOLIC PANEL: eGFR: 39

## 2022-06-29 LAB — MICROALBUMIN / CREATININE URINE RATIO: Microalb Creat Ratio: 9

## 2022-06-29 LAB — HEMOGLOBIN A1C: Hemoglobin A1C: 7.5

## 2022-06-29 LAB — BASIC METABOLIC PANEL: Creatinine: 1.8 — AB (ref 0.6–1.3)

## 2022-07-01 ENCOUNTER — Encounter: Payer: Self-pay | Admitting: *Deleted

## 2022-07-05 ENCOUNTER — Ambulatory Visit (INDEPENDENT_AMBULATORY_CARE_PROVIDER_SITE_OTHER): Payer: Medicare PPO | Admitting: Podiatry

## 2022-07-05 ENCOUNTER — Encounter: Payer: Self-pay | Admitting: Podiatry

## 2022-07-05 DIAGNOSIS — B351 Tinea unguium: Secondary | ICD-10-CM | POA: Diagnosis not present

## 2022-07-05 DIAGNOSIS — E1122 Type 2 diabetes mellitus with diabetic chronic kidney disease: Secondary | ICD-10-CM | POA: Diagnosis not present

## 2022-07-05 DIAGNOSIS — Z794 Long term (current) use of insulin: Secondary | ICD-10-CM

## 2022-07-05 DIAGNOSIS — M79675 Pain in left toe(s): Secondary | ICD-10-CM

## 2022-07-05 DIAGNOSIS — M79674 Pain in right toe(s): Secondary | ICD-10-CM

## 2022-07-05 DIAGNOSIS — N183 Chronic kidney disease, stage 3 unspecified: Secondary | ICD-10-CM | POA: Diagnosis not present

## 2022-07-05 NOTE — Progress Notes (Signed)
  Subjective:  Patient ID: James Glover, male    DOB: 1945/11/06,   MRN: [id number]  No chief complaint on file.   77 y.o. male presents for concern of thickened elongated and painful nails that are difficult to trim. Requesting to have them trimmed today. Denies any burning or tingling in her feet. Patient is diabetic and last A1c was 7.5  . Denies any other pedal complaints. Denies n/v/f/c.   PCP: Joselyn Arrow MD   Past Medical History:  Diagnosis Date   CKD (chronic kidney disease), stage III [facility])    nephrologist-- coladonato--- per lov note 03-08-2018 in epic, stable (baseline Cr 1.5 - 2)   Dyslipidemia    Erectile dysfunction    First degree heart block    GERD (gastroesophageal reflux disease)    Hemorrhoids    internal and external   History of nuclear stress test    05-26-2009 (by dr Alanda Amass due to HTN and DM2)--- normal study w/ no ischemia,  normal LV function and wall motion , ef 70%   Hyperplasia of prostate with lower urinary tract symptoms (LUTS)    Hypertension    Hypogonadism male    Iron deficiency    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 Berneice Heinrich   Prostate cancer [facility]) urologist-  dr t. Berneice Heinrich  oncologsit-  dr Kathrynn Running   dx 05-30-2018-- Stage T1c, Gleason 4+5, High Risk   Renal cyst, left    followed by dr t. Berneice Heinrich--  chronic noncomplex   Skin cancer 11/2021   basal cell, R ear   Type 2 diabetes mellitus ([facility])    followed by pcp   Wears partial dentures    upper    Objective:  Physical Exam: Vascular: DP/PT pulses 2/4 bilateral. CFT <3 seconds. Normal hair growth on digits. No edema.  Skin. No lacerations or abrasions bilateral feet. Nails 1-5 are thickened discolored and elongated with subungual debris.  Musculoskeletal: MMT 5/5 bilateral lower extremities in DF, PF, Inversion and Eversion. Deceased ROM in DF of ankle joint.  Neurological: Sensation intact to light touch.   Assessment:   1. Pain due to onychomycosis of  toenails of both feet   2. Controlled type 2 diabetes mellitus with stage 3 chronic kidney disease, with long-term current use of insulin ([facility])        Plan:  Patient was evaluated and treated and all questions answered. -Discussed and educated patient on diabetic foot care, especially with  regards to the vascular, neurological and musculoskeletal systems.  -Stressed the importance of good glycemic control and the detriment of not  controlling glucose levels in relation to the foot. -Discussed supportive shoes at all times and checking feet regularly.  -Mechanically debrided all nails 1-5 bilateral using sterile nail nipper and filed with dremel without incident  -Answered all patient questions -Patient to return  in 3 months for at risk foot care -Patient advised to call the office if any problems or questions arise in the meantime.   Louann Sjogren, DPM

## 2022-07-07 ENCOUNTER — Other Ambulatory Visit: Payer: Self-pay | Admitting: Family Medicine

## 2022-07-07 DIAGNOSIS — K219 Gastro-esophageal reflux disease without esophagitis: Secondary | ICD-10-CM

## 2022-07-07 MED ORDER — OMEPRAZOLE 40 MG PO CPDR
40.0000 mg | DELAYED_RELEASE_CAPSULE | Freq: Every day | ORAL | 0 refills | Status: DC
Start: 2022-07-07 — End: 2022-09-16

## 2022-07-14 ENCOUNTER — Telehealth: Payer: Self-pay

## 2022-07-14 NOTE — Progress Notes (Signed)
Patient ID: James Glover, male   DOB: 1946/01/16, 77 y.o.   MRN: [id number]  Care Management & Coordination Services Pharmacy Team  Reason for Encounter: Diabetes  Contacted patient to discuss diabetes disease state. Spoke with patient on 07/14/2022   Current antihyperglycemic regimen:  Tresiba 100 u/mL 18-22 units into skin at bedtime Onglyza 2.5mg  1 qd Ap   Patient verbally confirms he is taking the above medications as directed. Yes  What recent interventions/DTPs have been made to improve glycemic control:  Farxiga 10 mg - Last filled 04/05/22 90 DS at [facility] (stopped due to rash)  Have there been any recent hospitalizations or ED visits since last visit with PharmD? No  Patient denies hypoglycemic symptoms, including None  Patient denies hyperglycemic symptoms, including none  How often are you checking your blood sugar? once daily  What are your blood sugars ranging?  Fastings 07/09/22 115 07/10/22 105 07/11/22 113 07/12/22 89 07/13/22 105 07/14/22 134  During the week, how often does your blood glucose drop below 70? Never  Are you checking your feet daily/regularly? Yes Patient reports he has recently seen podiatry  Adherence Review: Is the patient currently on a STATIN medication? Yes Is the patient currently on ACE/ARB medication? Yes Does the patient have >5 day gap between last estimated fill dates? No   Chart Updates:  Recent office visits:  None   Recent consult visits:  04/29/22 Dani Gobble, NP - Patient presented for Type 2 diabetes mellitus with stage 3b CKD. Stopped [place].  Hospital visits:  None in previous 6 months  Medications: Outpatient Encounter Medications as of 07/14/2022  Medication Sig Note   ACCU-CHEK GUIDE test strip USE AS DIRECTED    amLODipine (NORVASC) 10 MG tablet TAKE 1 TABLET(10 MG) BY MOUTH DAILY    aspirin EC 81 MG tablet Take 81 mg by mouth daily. Swallow whole.    BD PEN NEEDLE NANO 2ND GEN 32G X 4 MM MISC USE  AS NEEDED AS DIRECTED    clotrimazole (LOTRIMIN) 1 % cream Apply 1 application  topically 2 (two) times daily as needed (jock itch). 09/07/2021: prn   furosemide (LASIX) 20 MG tablet Take 20 mg by mouth.    insulin degludec (TRESIBA FLEXTOUCH) 100 UNIT/ML FlexTouch Pen ADMINISTER 18 TO 20 UNITS UNDER THE SKIN AT BEDTIME    Multiple Vitamins-Minerals (MULTIVITAMIN WITH MINERALS) tablet Take 1 tablet by mouth daily. 03/09/2021: Centrum Silver Men's 50+   Omega-3 Fatty Acids (FISH OIL PO) Take 1,400 mg by mouth daily.    omeprazole (PRILOSEC) 40 MG capsule Take 1 capsule (40 mg total) by mouth daily.    oxybutynin (DITROPAN) 5 MG tablet Take 5 mg by mouth daily. 04/29/2022: Twice daily   saxagliptin HCl (ONGLYZA) 2.5 MG TABS tablet TAKE 1 TABLET(2.5 MG) BY MOUTH DAILY    simvastatin (ZOCOR) 20 MG tablet Take 1 tablet (20 mg total) by mouth daily.    tamsulosin (FLOMAX) 0.4 MG CAPS capsule Take 1 capsule (0.4 mg total) by mouth 2 (two) times daily after a meal. For urinary urgency or weak stream. (Patient taking differently: Take 0.4 mg by mouth daily. For urinary urgency or weak stream.) 04/29/2022: Tiwce daily   valACYclovir (VALTREX) 1000 MG tablet TAKE 2 TABLETS BY MOUTH AT ONSET OF COLD SORE. REPEAT ONCE IN 12 HOURS (4 TABS/COURSE) 09/07/2021: prn   No facility-administered encounter medications on file as of 07/14/2022.    Recent Relevant Labs: Lab Results  Component Value Date/Time   HGBA1C  7.5 06/29/2022 12:00 AM   HGBA1C 7.9 (A) 03/18/2022 02:16 PM   HGBA1C 7.1 (H) 09/03/2021 08:28 AM   HGBA1C 6.8 (A) 05/28/2021 03:04 PM   HGBA1C 6.2 (H) 01/12/2021 03:22 PM   MICROALBUR 14 05/26/2021 12:00 AM   MICROALBUR 8.8 02/10/2021 12:00 AM    Kidney Function Lab Results  Component Value Date/Time   CREATININE 1.8 (A) 06/29/2022 12:00 AM   CREATININE 1.85 (H) 09/03/2021 08:28 AM   CREATININE 1.62 (H) 01/12/2021 03:22 PM   CREATININE 1.41 (H) 11/11/2020 10:22 AM   CREATININE 1.73 (H) 02/02/2017  12:05 PM   GFRNONAA 44 (L) 01/12/2021 03:22 PM   GFRAA 47 (L) 08/13/2019 08:35 AM    Star Rating Drugs:  Saxagliptin (Onglyza) 2.5 mg - Last filled 06/18/22 90 DS at [facility] Simvastatin 20 mg - Last filled 06/14/22 90 DS at [facility]     Care Gaps: COVID Booster - Overdue AWV- 04/29/22 Pharm follow up 8/24  Pamala Duffel CMA Clinical Pharmacist Assistant [phone]

## 2022-07-20 DIAGNOSIS — L821 Other seborrheic keratosis: Secondary | ICD-10-CM | POA: Diagnosis not present

## 2022-07-20 DIAGNOSIS — Z85828 Personal history of other malignant neoplasm of skin: Secondary | ICD-10-CM | POA: Diagnosis not present

## 2022-07-20 DIAGNOSIS — B359 Dermatophytosis, unspecified: Secondary | ICD-10-CM | POA: Diagnosis not present

## 2022-07-20 DIAGNOSIS — L578 Other skin changes due to chronic exposure to nonionizing radiation: Secondary | ICD-10-CM | POA: Diagnosis not present

## 2022-07-20 DIAGNOSIS — D225 Melanocytic nevi of trunk: Secondary | ICD-10-CM | POA: Diagnosis not present

## 2022-07-20 DIAGNOSIS — D0421 Carcinoma in situ of skin of right ear and external auricular canal: Secondary | ICD-10-CM | POA: Diagnosis not present

## 2022-07-20 DIAGNOSIS — L814 Other melanin hyperpigmentation: Secondary | ICD-10-CM | POA: Diagnosis not present

## 2022-07-20 DIAGNOSIS — D485 Neoplasm of uncertain behavior of skin: Secondary | ICD-10-CM | POA: Diagnosis not present

## 2022-07-20 DIAGNOSIS — L570 Actinic keratosis: Secondary | ICD-10-CM | POA: Diagnosis not present

## 2022-07-29 ENCOUNTER — Ambulatory Visit: Payer: Medicare PPO | Admitting: Nurse Practitioner

## 2022-07-29 DIAGNOSIS — E1122 Type 2 diabetes mellitus with diabetic chronic kidney disease: Secondary | ICD-10-CM

## 2022-07-29 DIAGNOSIS — E782 Mixed hyperlipidemia: Secondary | ICD-10-CM

## 2022-07-29 DIAGNOSIS — Z794 Long term (current) use of insulin: Secondary | ICD-10-CM

## 2022-07-29 DIAGNOSIS — I10 Essential (primary) hypertension: Secondary | ICD-10-CM

## 2022-07-29 DIAGNOSIS — Z7984 Long term (current) use of oral hypoglycemic drugs: Secondary | ICD-10-CM

## 2022-07-31 ENCOUNTER — Other Ambulatory Visit: Payer: Self-pay | Admitting: Family Medicine

## 2022-07-31 DIAGNOSIS — E1122 Type 2 diabetes mellitus with diabetic chronic kidney disease: Secondary | ICD-10-CM

## 2022-08-08 ENCOUNTER — Other Ambulatory Visit: Payer: Self-pay | Admitting: Family Medicine

## 2022-08-08 DIAGNOSIS — N183 Chronic kidney disease, stage 3 unspecified: Secondary | ICD-10-CM

## 2022-08-18 DIAGNOSIS — C61 Malignant neoplasm of prostate: Secondary | ICD-10-CM | POA: Diagnosis not present

## 2022-08-27 ENCOUNTER — Telehealth (INDEPENDENT_AMBULATORY_CARE_PROVIDER_SITE_OTHER): Payer: Medicare PPO | Admitting: Medical

## 2022-08-27 ENCOUNTER — Encounter: Payer: Self-pay | Admitting: Medical

## 2022-08-27 VITALS — Temp 98.3°F | Ht 75.5 in | Wt 243.0 lb

## 2022-08-27 DIAGNOSIS — D49511 Neoplasm of unspecified behavior of right kidney: Secondary | ICD-10-CM | POA: Diagnosis not present

## 2022-08-27 DIAGNOSIS — R350 Frequency of micturition: Secondary | ICD-10-CM | POA: Diagnosis not present

## 2022-08-27 DIAGNOSIS — J988 Other specified respiratory disorders: Secondary | ICD-10-CM | POA: Diagnosis not present

## 2022-08-27 DIAGNOSIS — R058 Other specified cough: Secondary | ICD-10-CM | POA: Diagnosis not present

## 2022-08-27 DIAGNOSIS — C61 Malignant neoplasm of prostate: Secondary | ICD-10-CM | POA: Diagnosis not present

## 2022-08-27 DIAGNOSIS — N281 Cyst of kidney, acquired: Secondary | ICD-10-CM | POA: Diagnosis not present

## 2022-08-27 DIAGNOSIS — N5201 Erectile dysfunction due to arterial insufficiency: Secondary | ICD-10-CM | POA: Diagnosis not present

## 2022-08-27 MED ORDER — AMOXICILLIN 875 MG PO TABS: 875.0000 mg | ORAL_TABLET | Freq: Two times a day (BID) | ORAL | 0 refills | Status: AC

## 2022-08-27 NOTE — Progress Notes (Signed)
Subjective:     Patient ID: HAOYU FARNAN, male   DOB: 13-Oct-1945, 77 y.o.   MRN: [id number]  This visit type was conducted due to national recommendations for restrictions regarding the COVID-19 Pandemic (e.g. social distancing) in an effort to limit this patient's exposure and mitigate transmission in our community.  Due to their co-morbid illnesses, this patient is at least at moderate risk for complications without adequate follow up.  This format is felt to be most appropriate for this patient at this time.    Documentation for virtual audio and video telecommunications through [facility] encounter:  The patient was located at home. The provider was located in the office. The patient did consent to this visit and is aware of possible charges through their insurance for this visit.  The other persons participating in this telemedicine service were none. Time spent on call was 20 minutes and in review of previous records 20 minutes total.  This virtual service is not related to other E/M service within previous 7 days.   HPI Chief Complaint  Patient presents with   Acute Visit    Started last week, had sore throat last week, coughing ,runny nose a light green color, congestion in chest and head , no fever, pt hasn't taken a home covid test. Not taking medicines,using nasal spray   Virtual for cold symptoms.  He reports about a week history of sore throat, cough, runny nose, some purulent discharge, congestion chest and head.  Was in head and has progressed to chest.  Using some nasal spray for congestion.  No improvmenet.   Coughing some mucous out.  No fever, no body aches or chills.  No NVD. No SOB or wheezing.  No other aggravating or relieving factors. No other complaint.  Past Medical History:  Diagnosis Date   CKD (chronic kidney disease), stage III [facility])    nephrologist-- [place]--- per lov note 03-08-2018 in epic, stable (baseline Cr 1.5 - 2)   Dyslipidemia    Erectile  dysfunction    First degree heart block    GERD (gastroesophageal reflux disease)    Hemorrhoids    internal and external   History of nuclear stress test    05-26-2009 (by dr Alanda Amass due to HTN and DM2)--- normal study w/ no ischemia,  normal LV function and wall motion , ef 70%   Hyperplasia of prostate with lower urinary tract symptoms (LUTS)    Hypertension    Hypogonadism male    Iron deficiency    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 Berneice Heinrich   Prostate cancer [facility]) urologist-  dr t. Berneice Heinrich  oncologsit-  dr Kathrynn Running   dx 05-30-2018-- Stage T1c, Gleason 4+5, High Risk   Renal cyst, left    followed by dr t. Berneice Heinrich--  chronic noncomplex   Skin cancer 11/2021   basal cell, R ear   Type 2 diabetes mellitus (HCC)    followed by pcp   Wears partial dentures    upper   Current Outpatient Medications on File Prior to Visit  Medication Sig Dispense Refill   ACCU-CHEK GUIDE test strip USE AS DIRECTED 100 strip 5   amLODipine (NORVASC) 10 MG tablet TAKE 1 TABLET(10 MG) BY MOUTH DAILY 90 tablet 1   aspirin EC 81 MG tablet Take 81 mg by mouth daily. Swallow whole.     BD PEN NEEDLE NANO 2ND GEN 32G X 4 MM MISC USE AS NEEDED AS DIRECTED  100 each 3   clotrimazole (LOTRIMIN) 1 % cream Apply 1 application  topically 2 (two) times daily as needed (jock itch).     furosemide (LASIX) 20 MG tablet Take 20 mg by mouth.     insulin degludec (TRESIBA FLEXTOUCH) 100 UNIT/ML FlexTouch Pen ADMINISTER 18 TO 20 UNITS UNDER THE SKIN AT BEDTIME (Patient taking differently: Pt is taking 22 units) 9 mL 0   Multiple Vitamins-Minerals (MULTIVITAMIN WITH MINERALS) tablet Take 1 tablet by mouth daily.     Omega-3 Fatty Acids (FISH OIL PO) Take 1,400 mg by mouth daily.     omeprazole (PRILOSEC) 40 MG capsule Take 1 capsule (40 mg total) by mouth daily. 90 capsule 0   oxybutynin (DITROPAN) 5 MG tablet Take 5 mg by mouth daily.     simvastatin (ZOCOR) 20 MG tablet Take 1 tablet (20 mg  total) by mouth daily. 90 tablet 1   tamsulosin (FLOMAX) 0.4 MG CAPS capsule Take 1 capsule (0.4 mg total) by mouth 2 (two) times daily after a meal. For urinary urgency or weak stream. (Patient taking differently: Take 0.4 mg by mouth daily. For urinary urgency or weak stream.) 60 capsule 5   valACYclovir (VALTREX) 1000 MG tablet TAKE 2 TABLETS BY MOUTH AT ONSET OF COLD SORE. REPEAT ONCE IN 12 HOURS (4 TABS/COURSE) 28 tablet 0   saxagliptin HCl (ONGLYZA) 2.5 MG TABS tablet TAKE 1 TABLET(2.5 MG) BY MOUTH DAILY 90 tablet 0   No current facility-administered medications on file prior to visit.   Review of Systems As in subjective    Objective:   Physical Exam Due to coronavirus pandemic stay at home measures, patient visit was virtual and they were not examined in person.   Temp 98.3 F (36.8 C)   Ht 6' 3.5" (1.918 m)   Wt 243 lb (110.2 kg)   BMI 29.97 kg/m   Gen: wd, wn, nad No labored breathing or wheezing      Assessment:     Encounter Diagnoses  Name Primary?   Productive cough Yes   Respiratory tract infection        Plan:    Discussed limitations of virtual consult.  Respiratory tract infection worsening into the chest without significant improvement after roughly a week.  Begin medicine amoxicillin as below.  Begin Mucinex DM over-the-counter for the next 3 to 5 days.  Can continue nasal saline.  Rest, hydrate well.  If not much improved within the next 3 to 4 days or if worse over the weekend then get reevaluated  Demitry was seen today for acute visit.  Diagnoses and all orders for this visit:  Productive cough  Respiratory tract infection  Other orders -     amoxicillin (AMOXIL) 875 MG tablet; Take 1 tablet (875 mg total) by mouth 2 (two) times daily for 10 days.    F/u prn

## 2022-09-09 ENCOUNTER — Other Ambulatory Visit: Payer: Self-pay | Admitting: Family Medicine

## 2022-09-09 DIAGNOSIS — N183 Chronic kidney disease, stage 3 unspecified: Secondary | ICD-10-CM

## 2022-09-09 MED ORDER — TRESIBA FLEXTOUCH 100 UNIT/ML ~~LOC~~ SOPN
23.0000 [IU] | PEN_INJECTOR | Freq: Every day | SUBCUTANEOUS | 0 refills | Status: DC
Start: 2022-09-09 — End: 2022-09-23

## 2022-09-09 NOTE — Telephone Encounter (Signed)
Called and spoke to patient. He is out of these 2 medication and needed refills for his appt next week

## 2022-09-11 ENCOUNTER — Other Ambulatory Visit: Payer: Self-pay | Admitting: Family Medicine

## 2022-09-11 DIAGNOSIS — E785 Hyperlipidemia, unspecified: Secondary | ICD-10-CM

## 2022-09-15 NOTE — Progress Notes (Unsigned)
Chief Complaint  Patient presents with   Medical Management of Chronic Issues    Patient is not fasting today, had canteloupe, blackberries and peaches for breakfast. He will come back for labs. Has had dry itchy cough x 1 month or more. Has a place on his back he wants you to look at, doesn't want a gown.   Patient presents for 6 month f/u on chronic problems.  DM--Last A1c at our office was 7.9% in 03/2022. He was referred to endocrinologist, and was working on some dietary changes/Lifestyle Medicine interventions.  He missed his May visit (came the day after, had on calendar wrong), is scheduled for July.  His A1c was checked through his nephrologist in April, and was 7.5%. Endocrinology stopped the [place] due to fungal rashes.  He continues on Onglyza, [place] dose is 23 U.  He had still been having rashes in the groin after stopping the [place]. His dermatologist treated him with oral antifungal every other week for 4 weeks, and hasn't had rash recur yet.  He has been off the antifungal for a month.     Since visit with endocrinologist, he cut back on sweets, no longer craves them.  He cut back on starches (corn, potatoes, rice). He still drinks a lot of tea.  Now has it sweetened with 1/2 the amount of sugar that is called for in the recipe (wife makes) (Was to cut back on carbs, artificial sweeteners, per notes)  He reports he will be getting a CGM at upcoming endo appointment. Doesn't like pricking his finger too often.  He mostly checks fasting sugars, only rarely checks later in the day.  Sugars are running: Fasting: 88-147 (147 after fajitas--no tortilla, but had rice and a few chips)), mostly 110-130. Only a few values in the evenings, when checked mostly 150's-160's (once 115).  He denies hypoglycemia, polydipsia, polyuria. He sees the podiatrist for routine foot care, denies foot concerns. Last diabetic eye exam was 04/2022, no retinopathy.    Hypertension and CKD stage 3,  under the care of nephro.  Last labs showed Cr 1.8, GFR of 39 in 05/2022. He is compliant with amlodipine and furosemide, and denies side effects. He tries to follow a low sodium diet. He denies headaches, dizziness, chest pain, shortness of breath. He denies any edema. BP is checked infrequently, just twice in April/May, 118/60, 118/68.   BP Readings from Last 3 Encounters:  09/16/22 132/70  04/29/22 119/70  03/18/22 130/70     B12 deficiency--level was low 01/2019 (294). Improved when taking daily MVI.  He continues on multivitamin, no separate B12. He continues to take MVI daily.  Lab Results  Component Value Date   VITAMINB12 986 09/03/2021    GERD:  He decreased his PPI from BID to once at night. He continues to do well at this dose, without any heartburn, cough, throat-clearing.  He denies dysphagia.  Today he reports some itching/tickling of throat, with dry cough.  Denies any runny nose.  This has been going on for about a month, mainly when he lays down at night, so thinks related to postnasal drainage.  He takes Careers adviser for allergies (April through June), and sometimes in the Fall (September). He hasn't been taking any recently.  Hyperlipidemia: he is compliant with taking simvastatin, denies side effects. Lipids were at goal on last check. Aortic atherosclerosis was incidentally noted on abdominal CT 09/2020. He is due for recheck, isn't fasting today. Lab Results  Component Value Date  CHOL 156 09/03/2021   HDL 50 09/03/2021   LDLCALC 90 09/03/2021   TRIG 83 09/03/2021   CHOLHDL 3.1 09/03/2021      PMH, PSH, SH reviewed  Outpatient Encounter Medications as of 09/16/2022  Medication Sig Note   ACCU-CHEK GUIDE test strip USE AS DIRECTED    amLODipine (NORVASC) 10 MG tablet TAKE 1 TABLET(10 MG) BY MOUTH DAILY    aspirin EC 81 MG tablet Take 81 mg by mouth daily. Swallow whole.    BD PEN NEEDLE NANO 2ND GEN 32G X 4 MM MISC USE AS NEEDED AS DIRECTED    furosemide  (LASIX) 20 MG tablet Take 20 mg by mouth.    insulin degludec (TRESIBA FLEXTOUCH) 100 UNIT/ML FlexTouch Pen Inject 23 Units into the skin at bedtime.    Multiple Vitamins-Minerals (MULTIVITAMIN WITH MINERALS) tablet Take 1 tablet by mouth daily. 03/09/2021: Centrum Silver Men's 50+   Omega-3 Fatty Acids (FISH OIL PO) Take 1,400 mg by mouth daily.    omeprazole (PRILOSEC) 40 MG capsule Take 1 capsule (40 mg total) by mouth daily.    oxybutynin (DITROPAN) 5 MG tablet Take 5 mg by mouth daily. 09/16/2022: Twice daily   saxagliptin HCl (ONGLYZA) 2.5 MG TABS tablet TAKE 1 TABLET(2.5 MG) BY MOUTH DAILY    simvastatin (ZOCOR) 20 MG tablet Take 1 tablet (20 mg total) by mouth daily.    tamsulosin (FLOMAX) 0.4 MG CAPS capsule Take 1 capsule (0.4 mg total) by mouth 2 (two) times daily after a meal. For urinary urgency or weak stream. (Patient taking differently: Take 0.4 mg by mouth daily. For urinary urgency or weak stream.) 09/16/2022: Twice daily   clotrimazole (LOTRIMIN) 1 % cream Apply 1 application  topically 2 (two) times daily as needed (jock itch). (Patient not taking: Reported on 09/16/2022) 09/16/2022: As needed   valACYclovir (VALTREX) 1000 MG tablet TAKE 2 TABLETS BY MOUTH AT ONSET OF COLD SORE. REPEAT ONCE IN 12 HOURS (4 TABS/COURSE) (Patient not taking: Reported on 09/16/2022) 09/16/2022: As needed   No facility-administered encounter medications on file as of 09/16/2022.   Allergies  Allergen Reactions   Ace Inhibitors Cough   Statins Cough    ROS:  no fever, chills, headaches, dizziness, chest pain, shortness of breath, edema. Denies depression, nausea, vomiting, bowel changes, vision changes, bleeding, bruising.  Dry cough and postnasal drainage per HPI. Jock itch better since 4 week antifungal treatment (no recurrence since stopped a month ago; did have rash despite stopping [place]). He gets R knee pain after walking 1/2 mile; rests and can walk further (knee that had surgery). No  significant weight changes   PHYSICAL EXAM:  BP 132/70   Pulse (!) 56   Temp 98.1 F (36.7 C) (Tympanic)   Ht 6\' 3"  (1.905 m)   Wt 241 lb 12.8 oz (109.7 kg)   BMI 30.22 kg/m   Wt Readings from Last 3 Encounters:  09/16/22 241 lb 12.8 oz (109.7 kg)  08/27/22 243 lb (110.2 kg)  04/29/22 243 lb 6.4 oz (110.4 kg)   Well-appearing, pleasant male, in no distress.  Voice is slightly hoarse, chronic/unchanged.  Occasional throat-clearing/cough, sipping on water. HEENT: conjunctiva and sclera are clear, EOMI. OP clear.  Nasal mucosa with mild-mod edema, no erythema. Sinuses nontender. TM's and EAC's normal. Neck: no lymphadenopathy or mass, no bruit. Heart: bradycardic, with frequent ectopy/extra beats Lungs: clear, no wheezes, rales, ronchi Back: no CVA tenderness or spinal tenderness Abdomen: soft, nontender, no mass Extremities: no edema Neuro: alert  and oriented, normal gait Psych: normal mood, affect, normal eye contact, grooming. Skin: no rashes, normal turgor.  Purpura R forearm.  Dry legs. Upper mid back--hyperkeratotic area that appears to be a pore/hole within an area of scar (prior cyst removal). (Has appt with derm soon--for removal of cancer of R upper ear)     ASSESSMENT/PLAN:  Controlled type 2 diabetes mellitus with stage 3 chronic kidney disease, with long-term current use of insulin (HCC) - Under care of endo, f/u next week. A1c >7 in April (but improved, despite d/c farxiga). Agree with CGM. Reviewed diet - Plan: Lipid panel  Essential hypertension, benign - controlled on current regimen. Reviewed low Na diet. Wt loss encouraged  CKD stage 3 due to type 2 diabetes mellitus (HCC) - stable, under care of nephro  Anemia due to vitamin B12 deficiency, unspecified B12 deficiency type - cont MVI; last B12 level normal , no sx, last CBC (05/2022 at nephro) ok  Gastroesophageal reflux disease without esophagitis - Controlled, reviewed proper diet. Wt loss rec.  If cough  not improved with Allegra, consider BID PPI - Plan: omeprazole (PRILOSEC) 40 MG capsule  Aortic atherosclerosis (HCC) - cont statin - Plan: Lipid panel  Medication monitoring encounter - Plan: Lipid panel  Cough, unspecified type - poss related to postnasal drainage and allergies.  Rec restarting Allegra daily, +/- mucinex. Ddx reviewed  Due for lipids, not fasting today. To return for fasting labs.  Will need RF on simvastatin after labs back  No other labs needed.  05/2022 labs from nephrologist were reviewed.  A1c to be repeated by endo (upcoming appointment.  Will let endo take over his DM med refills (recently filled). Pt will discuss doing Pharmquest study (oral med vs insulin injections) with endo.  Encouraged him to show derm lesion on upper back--has upcoming appt for cancer removal from ear.  Counseled re: recommended vaccines.  F/u 6 mos for CPE/AWV

## 2022-09-16 ENCOUNTER — Ambulatory Visit: Payer: Medicare PPO | Admitting: Family Medicine

## 2022-09-16 ENCOUNTER — Encounter: Payer: Self-pay | Admitting: Family Medicine

## 2022-09-16 VITALS — BP 132/70 | HR 56 | Temp 98.1°F | Ht 75.0 in | Wt 241.8 lb

## 2022-09-16 DIAGNOSIS — Z794 Long term (current) use of insulin: Secondary | ICD-10-CM

## 2022-09-16 DIAGNOSIS — N183 Chronic kidney disease, stage 3 unspecified: Secondary | ICD-10-CM

## 2022-09-16 DIAGNOSIS — E1122 Type 2 diabetes mellitus with diabetic chronic kidney disease: Secondary | ICD-10-CM

## 2022-09-16 DIAGNOSIS — D519 Vitamin B12 deficiency anemia, unspecified: Secondary | ICD-10-CM

## 2022-09-16 DIAGNOSIS — K219 Gastro-esophageal reflux disease without esophagitis: Secondary | ICD-10-CM | POA: Diagnosis not present

## 2022-09-16 DIAGNOSIS — I10 Essential (primary) hypertension: Secondary | ICD-10-CM

## 2022-09-16 DIAGNOSIS — R059 Cough, unspecified: Secondary | ICD-10-CM

## 2022-09-16 DIAGNOSIS — I700 Atherosclerosis of aorta: Secondary | ICD-10-CM

## 2022-09-16 DIAGNOSIS — Z5181 Encounter for therapeutic drug level monitoring: Secondary | ICD-10-CM

## 2022-09-16 MED ORDER — OMEPRAZOLE 40 MG PO CPDR
40.0000 mg | DELAYED_RELEASE_CAPSULE | Freq: Every day | ORAL | 1 refills | Status: DC
Start: 2022-09-16 — End: 2023-03-17

## 2022-09-16 NOTE — Patient Instructions (Addendum)
I think the continuous glucose monitor is a great idea--to give you more data about what your sugar is doing in relation to what you are eating, and your exercise.  Consider having your wife make UNSWEET TEA, and that way you are truly aware of how much sugar you are using to sweeten it.  Ideally you want to keep added sugar to a minimum.  Try taking some Allegra once daily and see if this helps with your cough/drainage. You can also take some mucinex (plain).   Remember to get your high dose flu shot in the Fall. Get the new, updated COVID booster once it becomes available (in the Fall).  Check with your pharmacy to see if you had the RSV vaccine. If you have never gotten one, get it in the Fall (when the season starts again). If you did have it last year, you may not need another (they haven't made an announcement regarding the recommended frequency, it may not be needed yearly).

## 2022-09-17 ENCOUNTER — Other Ambulatory Visit: Payer: Self-pay | Admitting: Family Medicine

## 2022-09-17 DIAGNOSIS — E1122 Type 2 diabetes mellitus with diabetic chronic kidney disease: Secondary | ICD-10-CM

## 2022-09-20 ENCOUNTER — Other Ambulatory Visit: Payer: Medicare PPO

## 2022-09-20 DIAGNOSIS — I700 Atherosclerosis of aorta: Secondary | ICD-10-CM

## 2022-09-20 DIAGNOSIS — Z5181 Encounter for therapeutic drug level monitoring: Secondary | ICD-10-CM

## 2022-09-20 DIAGNOSIS — Z794 Long term (current) use of insulin: Secondary | ICD-10-CM | POA: Diagnosis not present

## 2022-09-20 DIAGNOSIS — N183 Chronic kidney disease, stage 3 unspecified: Secondary | ICD-10-CM | POA: Diagnosis not present

## 2022-09-20 DIAGNOSIS — E1122 Type 2 diabetes mellitus with diabetic chronic kidney disease: Secondary | ICD-10-CM | POA: Diagnosis not present

## 2022-09-21 ENCOUNTER — Other Ambulatory Visit: Payer: Self-pay | Admitting: Family Medicine

## 2022-09-21 DIAGNOSIS — E785 Hyperlipidemia, unspecified: Secondary | ICD-10-CM

## 2022-09-21 LAB — LIPID PANEL
Chol/HDL Ratio: 2.6 ratio (ref 0.0–5.0)
Cholesterol, Total: 102 mg/dL (ref 100–199)
HDL: 39 mg/dL — ABNORMAL LOW (ref >39)
LDL Chol Calc (NIH): 49 mg/dL (ref 0–99)
Triglycerides: 66 mg/dL (ref 0–149)
VLDL Cholesterol Cal: 14 mg/dL (ref 5–40)

## 2022-09-21 MED ORDER — SIMVASTATIN 20 MG PO TABS
20.0000 mg | ORAL_TABLET | Freq: Every day | ORAL | 3 refills | Status: DC
Start: 2022-09-21 — End: 2023-09-20

## 2022-09-23 ENCOUNTER — Ambulatory Visit: Payer: Medicare PPO | Admitting: Nurse Practitioner

## 2022-09-23 ENCOUNTER — Encounter: Payer: Self-pay | Admitting: Nurse Practitioner

## 2022-09-23 VITALS — BP 126/72 | HR 54 | Ht 75.5 in | Wt 242.2 lb

## 2022-09-23 DIAGNOSIS — Z794 Long term (current) use of insulin: Secondary | ICD-10-CM

## 2022-09-23 DIAGNOSIS — N1832 Chronic kidney disease, stage 3b: Secondary | ICD-10-CM | POA: Diagnosis not present

## 2022-09-23 DIAGNOSIS — Z7984 Long term (current) use of oral hypoglycemic drugs: Secondary | ICD-10-CM

## 2022-09-23 DIAGNOSIS — C44222 Squamous cell carcinoma of skin of right ear and external auricular canal: Secondary | ICD-10-CM | POA: Diagnosis not present

## 2022-09-23 DIAGNOSIS — E1122 Type 2 diabetes mellitus with diabetic chronic kidney disease: Secondary | ICD-10-CM

## 2022-09-23 DIAGNOSIS — N183 Chronic kidney disease, stage 3 unspecified: Secondary | ICD-10-CM | POA: Diagnosis not present

## 2022-09-23 LAB — POCT GLYCOSYLATED HEMOGLOBIN (HGB A1C): Hemoglobin A1C: 6.7 % — AB (ref 4.0–5.6)

## 2022-09-23 MED ORDER — TRESIBA FLEXTOUCH 100 UNIT/ML ~~LOC~~ SOPN
23.0000 [IU] | PEN_INJECTOR | Freq: Every day | SUBCUTANEOUS | 3 refills | Status: DC
Start: 2022-09-23 — End: 2023-01-19

## 2022-09-23 MED ORDER — SAXAGLIPTIN HCL 2.5 MG PO TABS
2.5000 mg | ORAL_TABLET | Freq: Every day | ORAL | 1 refills | Status: DC
Start: 2022-09-23 — End: 2023-01-19

## 2022-09-23 NOTE — Progress Notes (Signed)
Endocrinology Follow Up Note       09/23/2022, 1:24 PM   Subjective:    Patient ID: James Glover, male    DOB: 12-19-1945.  James Glover is being seen in follow up after being seen in consultation for management of currently uncontrolled symptomatic diabetes requested by  Joselyn Arrow, MD.   Past Medical History:  Diagnosis Date   CKD (chronic kidney disease), stage III [facility])    nephrologist-- [place]--- per lov note 03-08-2018 in epic, stable (baseline Cr 1.5 - 2)   Dyslipidemia    Erectile dysfunction    First degree heart block    GERD (gastroesophageal reflux disease)    Hemorrhoids    internal and external   History of nuclear stress test    05-26-2009 (by dr Alanda Amass due to HTN and DM2)--- normal study w/ no ischemia,  normal LV function and wall motion , ef 70%   Hyperplasia of prostate with lower urinary tract symptoms (LUTS)    Hypertension    Hypogonadism male    Iron deficiency    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 Berneice Heinrich   Prostate cancer [facility]) urologist-  dr t. Berneice Heinrich  oncologsit-  dr Kathrynn Running   dx 05-30-2018-- Stage T1c, Gleason 4+5, High Risk   Renal cyst, left    followed by dr t. Berneice Heinrich--  chronic noncomplex   Skin cancer 11/2021   basal cell, R ear   Type 2 diabetes mellitus (HCC)    followed by pcp   Wears partial dentures    upper    Past Surgical History:  Procedure Laterality Date   CHOLECYSTECTOMY N/A 01/14/2021   Procedure: LAPAROSCOPIC CHOLECYSTECTOMY; REMOVAL OF CHOLECYSTOSTOMY DRAIN;  Surgeon: Lucretia Roers, MD;  Location: [facility];  Service: General;  Laterality: N/A;   CIRCUMCISION  age 63   CYSTOSCOPY N/A 11/03/2018   Procedure: Derinda Late;  Surgeon: Sebastian Ache, MD;  Location: [facility];  Service: Urology;  Laterality: N/A;  NO SEEDS FOUND IN BLADDER   dental implants     IR EXCHANGE BILIARY DRAIN   12/29/2020   IR FLUORO GUIDED NEEDLE PLC ASPIRATION/INJECTION LOC  10/24/2020   IR GUIDED DRAIN W CATHETER PLACEMENT  10/24/2020   IR GUIDED DRAIN W CATHETER PLACEMENT  10/24/2020   IR PERC CHOLECYSTOSTOMY  10/24/2020   IR US GUIDE BX ASP/DRAIN  10/24/2020   IR US GUIDE BX ASP/DRAIN  10/24/2020   IR US GUIDE BX ASP/DRAIN  10/24/2020   KNEE ARTHROSCOPY Right 11/14/2017   dr rogers @[facility]    KNEE CARTILAGE SURGERY Right    right knee meniscus repair   RADIOACTIVE SEED IMPLANT N/A 11/03/2018   Procedure: RADIOACTIVE SEED IMPLANT/BRACHYTHERAPY IMPLANT;  Surgeon: Sebastian Ache, MD;  Location: [facility];  Service: Urology;  Laterality: N/A;       SEEDS IMPLANTED   SPACE OAR INSTILLATION N/A 11/03/2018   Procedure: SPACE OAR INSTILLATION;  Surgeon: Sebastian Ache, MD;  Location: [facility];  Service: Urology;  Laterality: N/A;    Social History   Socioeconomic History   Marital status: Married    Spouse name: Not  on file   Number of children: 0   Years of education: Not on file   Highest education level: Not on file  Occupational History   Occupation: police officer--retired    Employer: [employer]  Tobacco Use   Smoking status: Never   Smokeless tobacco: Never  Vaping Use   Vaping status: Never Used  Substance and Sexual Activity   Alcohol use: Yes    Alcohol/week: 3.0 standard drinks of alcohol    Types: 3 Glasses of wine per week    Comment: 1-2 glasses of wine per week   Drug use: Never   Sexual activity: Not Currently    Comment: ED  Other Topics Concern   Not on file  Social History Narrative   Retired 06/2011.  Lives with wife, 1 cat.   Has a place on [place], spends 1-2 weeks/month there   Has 9 siblings      Updated 03/2022   Social Determinants of Health   Financial Resource Strain: Low Risk  (04/29/2022)   Overall Financial Resource Strain (CARDIA)    Difficulty of Paying Living Expenses: Not very hard  Food  Insecurity: No Food Insecurity (04/29/2022)   Hunger Vital Sign    Worried About Running Out of Food in the Last Year: Never true    Ran Out of Food in the Last Year: Never true  Transportation Needs: No Transportation Needs (04/29/2022)   PRAPARE - Administrator, Civil Service (Medical): No    Lack of Transportation (Non-Medical): No  Physical Activity: Sufficiently Active (04/29/2022)   Exercise Vital Sign    Days of Exercise per Week: 7 days    Minutes of Exercise per Session: 120 min  Stress: Not on file  Social Connections: Not on file    Family History  Problem Relation Age of Onset   Dementia Mother    Diabetes Mother    Lymphoma Father    Cancer Father        lymphoma   Hypertension Father    Diabetes Sister    Healthy Sister    Lymphoma Brother    Cancer Brother        lymphoma   Cancer Brother        skin cancer   Diabetes Brother    Diabetes Brother    Kidney disease Brother    COPD Brother    Prostate cancer Brother 52   Lymphoma Paternal Grandfather    Cancer Paternal Grandfather        lymphoma   Heart disease Neg Hx     Outpatient Encounter Medications as of 09/23/2022  Medication Sig   ACCU-CHEK GUIDE test strip USE AS DIRECTED   amLODipine (NORVASC) 10 MG tablet TAKE 1 TABLET(10 MG) BY MOUTH DAILY   aspirin EC 81 MG tablet Take 81 mg by mouth daily. Swallow whole.   BD PEN NEEDLE NANO 2ND GEN 32G X 4 MM MISC USE AS NEEDED AS DIRECTED   clotrimazole (LOTRIMIN) 1 % cream Apply 1 application  topically 2 (two) times daily as needed (jock itch).   furosemide (LASIX) 20 MG tablet Take 20 mg by mouth.   Multiple Vitamins-Minerals (MULTIVITAMIN WITH MINERALS) tablet Take 1 tablet by mouth daily.   Omega-3 Fatty Acids (FISH OIL PO) Take 1,400 mg by mouth daily.   omeprazole (PRILOSEC) 40 MG capsule Take 1 capsule (40 mg total) by mouth daily.   oxybutynin (DITROPAN) 5 MG tablet Take 5 mg by mouth  daily.   simvastatin (ZOCOR) 20 MG tablet Take 1  tablet (20 mg total) by mouth daily.   tamsulosin (FLOMAX) 0.4 MG CAPS capsule Take 1 capsule (0.4 mg total) by mouth 2 (two) times daily after a meal. For urinary urgency or weak stream. (Patient taking differently: Take 0.4 mg by mouth daily. For urinary urgency or weak stream.)   valACYclovir (VALTREX) 1000 MG tablet TAKE 2 TABLETS BY MOUTH AT ONSET OF COLD SORE. REPEAT ONCE IN 12 HOURS (4 TABS/COURSE)   [DISCONTINUED] insulin degludec (TRESIBA FLEXTOUCH) 100 UNIT/ML FlexTouch Pen Inject 23 Units into the skin at bedtime.   [DISCONTINUED] saxagliptin HCl (ONGLYZA) 2.5 MG TABS tablet TAKE 1 TABLET(2.5 MG) BY MOUTH DAILY   insulin degludec (TRESIBA FLEXTOUCH) 100 UNIT/ML FlexTouch Pen Inject 23 Units into the skin at bedtime.   saxagliptin HCl (ONGLYZA) 2.5 MG TABS tablet Take 1 tablet (2.5 mg total) by mouth daily. TAKE 1 TABLET(2.5 MG) BY MOUTH DAILY   No facility-administered encounter medications on file as of 09/23/2022.    ALLERGIES: Allergies  Allergen Reactions   Ace Inhibitors Cough   Statins Cough    VACCINATION STATUS: Immunization History  Administered Date(s) Administered   DTaP 07/25/2008   Fluad Quad(high Dose 65+) 02/21/2020   Influenza Split 11/17/2011, 12/30/2013, 12/26/2014   Influenza, High Dose Seasonal PF 01/30/2013, 11/12/2015, 12/08/2016, 01/16/2018, 12/25/2018, 02/10/2021   Influenza-Unspecified 12/16/2016, 02/12/2021, 11/29/2021   Moderna SARS-COV2 Booster Vaccination 02/21/2020, 08/21/2020   Moderna Sars-Covid-2 Vaccination 05/15/2019, 06/12/2019   Pneumococcal Conjugate-13 03/04/2014   Pneumococcal Polysaccharide-23 11/29/2009, 04/09/2015   Tdap 07/25/2008, 05/04/2021   Zoster Recombinant(Shingrix) 05/22/2021, 07/06/2021   Zoster, Live 09/05/2013    Diabetes He presents for his follow-up diabetic visit. He has type 2 diabetes mellitus. Onset time: Diagnosed at approx age of 53. His disease course has been improving. There are no hypoglycemic associated  symptoms. Associated symptoms include polyuria. (Yeast infection in groin) There are no hypoglycemic complications. Symptoms are stable. Diabetic complications include nephropathy. Risk factors for coronary artery disease include diabetes mellitus, male sex and hypertension. Current diabetic treatment includes insulin injections and oral agent (dual therapy). He is compliant with treatment most of the time. His weight is fluctuating minimally. He is following a generally healthy diet. Meal planning includes avoidance of concentrated sweets and ADA exchanges. He has not had a previous visit with a dietitian. He participates in exercise daily. His home blood glucose trend is fluctuating minimally. His breakfast blood glucose range is generally 90-110 mg/dl. (He presents today with his meter and logs showing at goal fasting glycemic profile.  His POCT A1c today is, improving from last visit of 7.5%.  Analysis of his meter shows 7-day average of 103, 14-day average of 116, 30-day average of 114, 90-day average of 115.  He denies any significant hypoglycemia, lowest reading noted was 82.) An ACE inhibitor/angiotensin II receptor blocker is not being taken. He sees a podiatrist.Eye exam is current.    Review of systems  Constitutional: + Minimally fluctuating body weight,  current Body mass index is 29.87 kg/m. , no fatigue, no subjective hyperthermia, no subjective hypothermia Eyes: no blurry vision, no xerophthalmia ENT: no sore throat, no nodules palpated in throat, no dysphagia/odynophagia, no hoarseness Cardiovascular: no chest pain, no shortness of breath, no palpitations, no leg swelling Respiratory: no cough, no shortness of breath Gastrointestinal: no nausea/vomiting/diarrhea Musculoskeletal: no muscle/joint aches Skin: no rashes, no hyperemia, recent skin cancer removal to right ear, rash in groin resolved after discontinuation of SGLT2i  and fungal treatment Neurological: no tremors, no numbness,  no tingling, no dizziness Psychiatric: no depression, no anxiety  Objective:     BP 126/72 (BP Location: Left Arm, Patient Position: Sitting, Cuff Size: Large)   Pulse (!) 54   Ht 6' 3.5" (1.918 m)   Wt 242 lb 3.2 oz (109.9 kg)   BMI 29.87 kg/m   Wt Readings from Last 3 Encounters:  09/23/22 242 lb 3.2 oz (109.9 kg)  09/16/22 241 lb 12.8 oz (109.7 kg)  08/27/22 243 lb (110.2 kg)     BP Readings from Last 3 Encounters:  09/23/22 126/72  09/16/22 132/70  04/29/22 119/70     Physical Exam- Limited  Constitutional:  Body mass index is 29.87 kg/m. , not in acute distress, normal state of mind Eyes:  EOMI, no exophthalmos Musculoskeletal: no gross deformities, strength intact in all four extremities, no gross restriction of joint movements Skin:  no rashes, no hyperemia, bandage to right ear (skin cancer removal this morning) Neurological: no tremor with outstretched hands   Diabetic Foot Exam - Simple   No data filed      CMP ( most recent) CMP     Component Value Date/Time   NA 139 09/03/2021 0828   K 4.6 09/03/2021 0828   CL 99 09/03/2021 0828   CO2 24 09/03/2021 0828   GLUCOSE 94 09/03/2021 0828   GLUCOSE 136 (H) 01/12/2021 1522   BUN 29 (H) 09/03/2021 0828   CREATININE 1.8 (A) 06/29/2022 0000   CREATININE 1.85 (H) 09/03/2021 0828   CREATININE 1.41 (H) 11/11/2020 1022   CALCIUM 9.2 09/03/2021 0828   PROT 7.0 09/03/2021 0828   ALBUMIN 4.1 09/03/2021 0828   AST 15 09/03/2021 0828   ALT 15 09/03/2021 0828   ALKPHOS 129 (H) 09/03/2021 0828   BILITOT 0.5 09/03/2021 0828   GFRNONAA 44 (L) 01/12/2021 1522   GFRAA 47 (L) 08/13/2019 0835     Diabetic Labs (most recent): Lab Results  Component Value Date   HGBA1C 6.7 (A) 09/23/2022   HGBA1C 7.5 06/29/2022   HGBA1C 7.9 (A) 03/18/2022   MICROALBUR 14 05/26/2021   MICROALBUR 8.8 02/10/2021   MICROALBUR <4 02/13/2020     Lipid Panel ( most recent) Lipid Panel     Component Value Date/Time   CHOL 102  09/20/2022 1645   TRIG 66 09/20/2022 1645   HDL 39 (L) 09/20/2022 1645   CHOLHDL 2.6 09/20/2022 1645   CHOLHDL 2.4 02/02/2017 1205   VLDL 12 06/23/2016 1032   LDLCALC 49 09/20/2022 1645   LDLCALC 71 02/02/2017 1205   LABVLDL 14 09/20/2022 1645      Lab Results  Component Value Date   TSH 2.060 09/03/2021   TSH 3.010 08/19/2020   TSH 3.000 02/05/2019   TSH 2.580 08/31/2017   TSH 2.21 06/23/2016   TSH 1.99 04/09/2015   TSH 2.093 02/25/2014   TSH 2.021 01/30/2013   TSH 2.113 12/01/2011   TSH 1.681 08/26/2010           Assessment & Plan:   1) Type 2 diabetes mellitus with stage 3b chronic kidney disease, with long-term current use of insulin (HCC)  He presents today with his meter and logs showing at goal fasting glycemic profile.  His POCT A1c today is 6.7, improving from last visit of 7.5%.  Analysis of his meter shows 7-day average of 103, 14-day average of 116, 30-day average of 114, 90-day average of 115.  He denies any significant hypoglycemia, lowest  reading noted was 82.  - James Glover has currently uncontrolled symptomatic type 2 DM since 77 years of age.   -Recent labs reviewed.  - I had a long discussion with him about the progressive nature of diabetes and the pathology behind its complications. -his diabetes is complicated by CKD stage 3b (sees nephrology) and he remains at a high risk for more acute and chronic complications which include CAD, CVA, CKD, retinopathy, and neuropathy. These are all discussed in detail with him.  The following Lifestyle Medicine recommendations according to American College of Lifestyle Medicine [facility]) were discussed and offered to patient and he agrees to start the journey:  A. Whole Foods, Plant-based plate comprising of fruits and vegetables, plant-based proteins, whole-grain carbohydrates was discussed in detail with the patient.   A list for source of those nutrients were also provided to the patient.  Patient will use only  water or unsweetened tea for hydration. B.  The need to stay away from risky substances including alcohol, smoking; obtaining 7 to 9 hours of restorative sleep, at least 150 minutes of moderate intensity exercise weekly, the importance of healthy social connections,  and stress reduction techniques were discussed. C.  A full color page of  Calorie density of various food groups per pound showing examples of each food groups was provided to the patient.  - Nutritional counseling repeated at each appointment due to patients tendency to fall back in to old habits.  - The patient admits there is a room for improvement in their diet and drink choices. -  Suggestion is made for the patient to avoid simple carbohydrates from their diet including Cakes, Sweet Desserts / Pastries, Ice Cream, Soda (diet and regular), Sweet Tea, Candies, Chips, Cookies, Sweet Pastries, Store Bought Juices, Alcohol in Excess of 1-2 drinks a day, Artificial Sweeteners, Coffee Creamer, and "Sugar-free" Products. This will help patient to have stable blood glucose profile and potentially avoid unintended weight gain.   - I encouraged the patient to switch to unprocessed or minimally processed complex starch and increased protein intake (animal or plant source), fruits, and vegetables.   - Patient is advised to stick to a routine mealtimes to eat 3 meals a day and avoid unnecessary snacks (to snack only to correct hypoglycemia).  - I have approached him with the following individualized plan to manage his diabetes and patient agrees:   -Given his great control, no changes will be made to his medications today.  He is advised to continue Tresiba 23 units SQ nightly and Onglyza 2.5 mg daily.  -he is encouraged to continue monitoring glucose at least once daily, before breakfast, and to call the clinic if he has readings less than 70 or above 300 for 3 tests in a row.   - he is warned not to take insulin without proper monitoring  per orders. - Adjustment parameters are given to him for hypo and hyperglycemia in writing.  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 - he is not a candidate for Metformin due to concurrent renal insufficiency.  - Specific targets for  A1c; LDL, HDL, and Triglycerides were discussed with the patient.  2) Blood Pressure /Hypertension:  his blood pressure is controlled to target.   he is advised to continue his current medications including Norvasc 10 mg p.o. daily with breakfast.  3) Lipids/Hyperlipidemia:    Review of his recent lipid panel from  09/03/21 showed controlled LDL at 90 .  he is advised to continue Simvastatin 20 mg daily at bedtime.  Side effects and precautions discussed with him.  4)  Weight/Diet:  his Body mass index is 29.87 kg/m.  -  clearly complicating his diabetes care.   he is a candidate for weight loss. I discussed with him the fact that loss of 5 - 10% of his  current body weight will have the most impact on his diabetes management.  Exercise, and detailed carbohydrates information provided  -  detailed on discharge instructions.  5) Chronic Care/Health Maintenance: -he is not on ACEI/ARB and is on Statin medications and is encouraged to initiate and continue to follow up with Ophthalmology, Dentist, Podiatrist at least yearly or according to recommendations, and advised to stay away from smoking. I have recommended yearly flu vaccine and pneumonia vaccine at least every 5 years; moderate intensity exercise for up to 150 minutes weekly; and sleep for at least 7 hours a day.  - he is advised to maintain close follow up with Joselyn Arrow, MD for primary care needs, as well as his other providers for optimal and coordinated care.    I spent  30  minutes in the care of the patient today including review of labs from CMP, Lipids, Thyroid Function, Hematology  (current and previous including abstractions from other facilities); face-to-face time discussing  his blood glucose readings/logs, discussing hypoglycemia and hyperglycemia episodes and symptoms, medications doses, his options of short and long term treatment based on the latest standards of care / guidelines;  discussion about incorporating lifestyle medicine;  and documenting the encounter. Risk reduction counseling performed per USPSTF guidelines to reduce obesity and cardiovascular risk factors.     Please refer to Patient Instructions for Blood Glucose Monitoring and Insulin/Medications Dosing Guide"  in media tab for additional information. Please  also refer to " Patient Self Inventory" in the Media  tab for reviewed elements of pertinent patient history.  James Glover participated in the discussions, expressed understanding, and voiced agreement with the above plans.  All questions were answered to his satisfaction. he is encouraged to contact clinic should he have any questions or concerns prior to his return visit.     Follow up plan: - Return in about 4 months (around 01/24/2023) for Diabetes F/U with A1c in office, No previsit labs, Bring meter and logs.   Ronny Bacon, [facility] [address] [place], [place] [address] Phone: [phone] Fax: [phone]  09/23/2022, 1:24 PM

## 2022-10-03 IMAGING — XA IR CATHETER TUBE CHANGE
2 series · 5 of 5 positions shown · non-contrast
Comparison: none

INDICATION: 75-year-old male with a history of acute gangrenous cholecystitis
complicated by perihepatic and intrahepatic abscess formation. She
underwent percutaneous transhepatic cholecystostomy tube placement
as well as placement of additional abscess drains on 10/24/2020.

[Series 2: fl (-) angio · 4 of 29 frames shown (1 of 2)]
[frame 5/29]
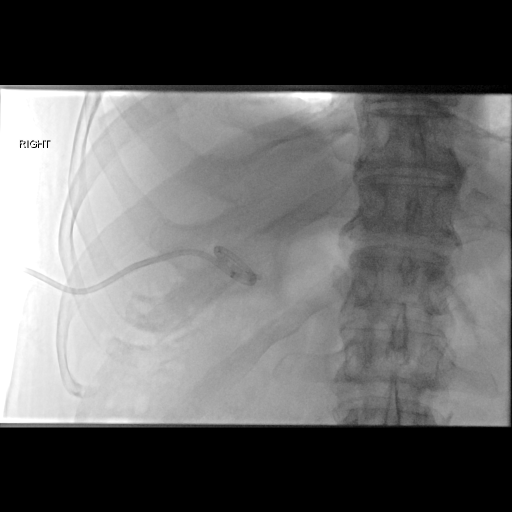
[frame 15/29]
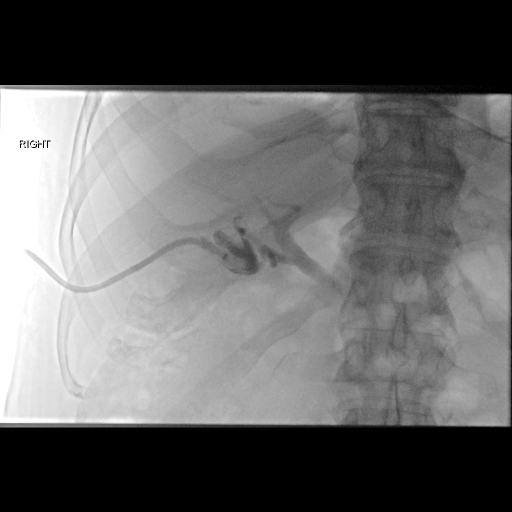
[frame 18/29]
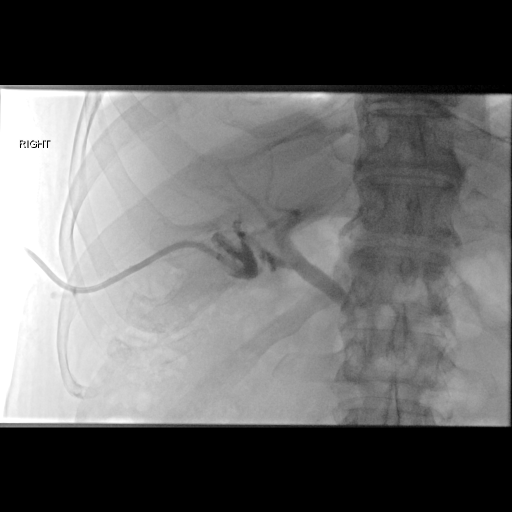
[frame 25/29]
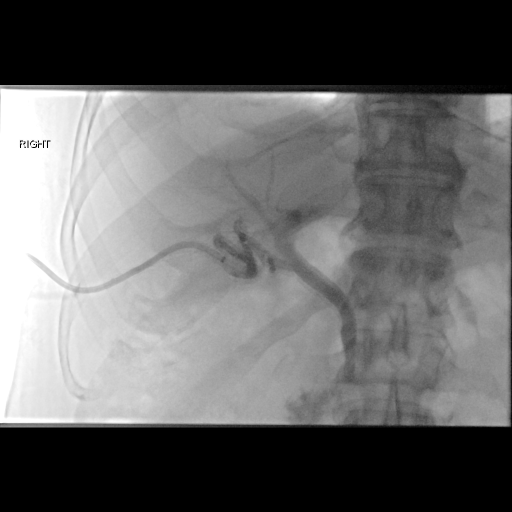

[Series 4: fl (-) angio · 1 of 1 slices shown (2 of 2)]
[im 1/1]
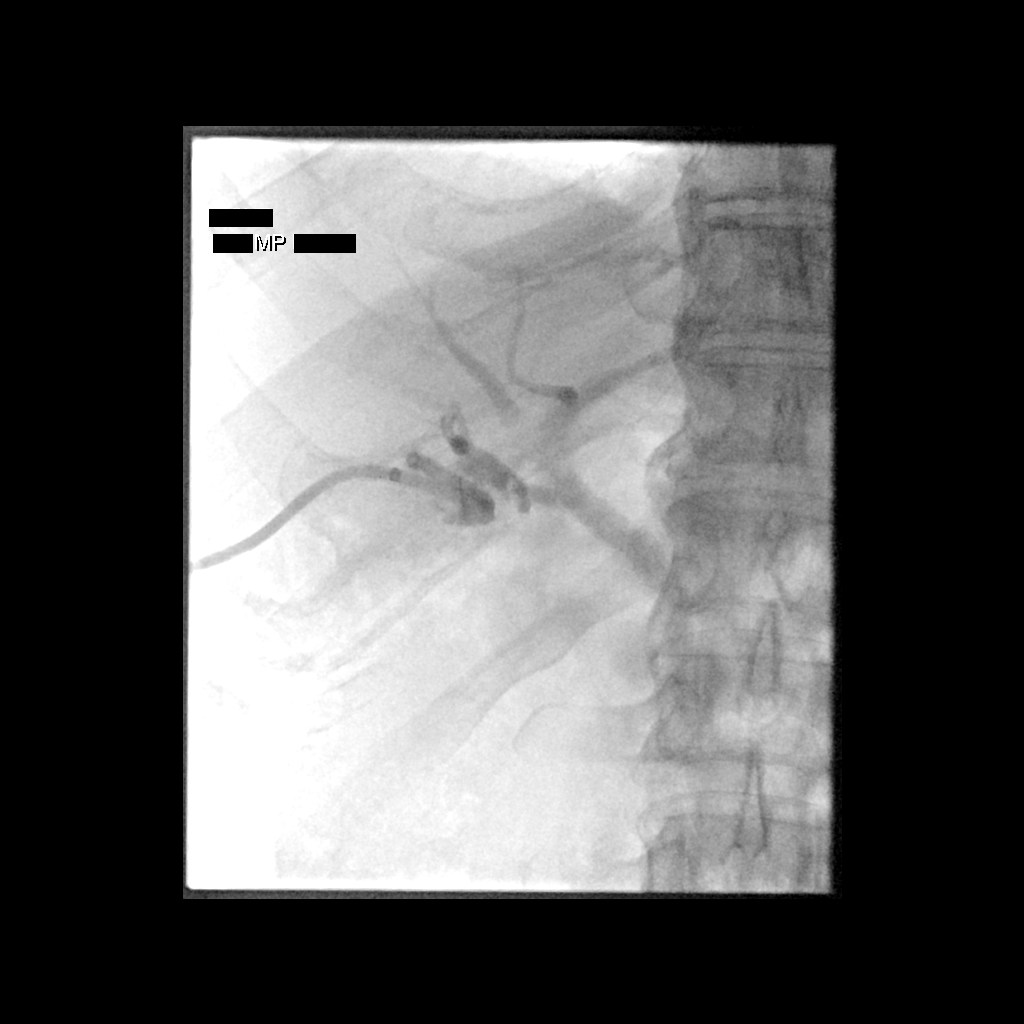

[5 of 5 positions shown; findings below may reference images not displayed]

Patient presents today for initial drain injection, evaluation and
possible drain exchange.

Patient reports that they are to have a follow-up appointment with
general surgery to discuss possible cholecystectomy in the near
future.

EXAM:
IR CATHETER TUBE CHANGE

MEDICATIONS:
None.

ANESTHESIA/SEDATION:
None.

FLUOROSCOPY TIME:  Fluoroscopy Time: 0 minutes 49 seconds (9 mGy).

COMPLICATIONS:
None immediate.

PROCEDURE:
Informed written consent was obtained from the patient after a
thorough discussion of the procedural risks, benefits and
alternatives. All questions were addressed. Maximal Sterile Barrier
Technique was utilized including caps, mask, sterile gowns, sterile
gloves, sterile drape, hand hygiene and skin antiseptic. A timeout
was performed prior to the initiation of the procedure.

The existing tube was evaluated with fluoroscopy. The tube is in the
normal position in the right upper quadrant. A gentle hand injection
of contrast was performed under fluoroscopy. The gallbladder is
contracted and collapse down around the tube. The cystic duct is
patent. The common hepatic and common bile ducts are patent.
Contrast material passes through the ampulla and into the duodenum.
However, during contrast injection the patient developed some
discomfort and right upper quadrant pain.

The retention suture was cut. The existing catheter was removed over
an Amplatz wire. A new 10.2 French all-purpose drainage catheter was
advanced over the wire and reformed. Gentle contrast injection
confirms appropriate placement.
IMPRESSION: 1. Patent cystic and common bile duct.
2. Successful exchange for a new 10.2 French percutaneous
cholecystostomy tube.
3. Due to discomfort during contrast injection, a capped trial was
not initiated despite apparent patency of the cystic and common bile
ducts. Will await recommendation from surgery as to whether or not
the patient is a surgical candidate. If not a surgical candidate, we
will attempt a capped trial at the patient's next scheduled visit in
8-10 weeks.

## 2022-10-15 ENCOUNTER — Other Ambulatory Visit: Payer: Self-pay | Admitting: Family Medicine

## 2022-10-15 DIAGNOSIS — E1122 Type 2 diabetes mellitus with diabetic chronic kidney disease: Secondary | ICD-10-CM

## 2022-10-15 NOTE — Telephone Encounter (Signed)
This was filled on 09/23/22 #90 with 1 refill. Too soon for refill

## 2022-10-16 ENCOUNTER — Other Ambulatory Visit: Payer: Self-pay | Admitting: Family Medicine

## 2022-10-16 DIAGNOSIS — I10 Essential (primary) hypertension: Secondary | ICD-10-CM

## 2022-10-20 DIAGNOSIS — I129 Hypertensive chronic kidney disease with stage 1 through stage 4 chronic kidney disease, or unspecified chronic kidney disease: Secondary | ICD-10-CM | POA: Diagnosis not present

## 2022-10-20 DIAGNOSIS — N1831 Chronic kidney disease, stage 3a: Secondary | ICD-10-CM | POA: Diagnosis not present

## 2022-10-20 DIAGNOSIS — E785 Hyperlipidemia, unspecified: Secondary | ICD-10-CM | POA: Diagnosis not present

## 2022-10-20 DIAGNOSIS — N179 Acute kidney failure, unspecified: Secondary | ICD-10-CM | POA: Diagnosis not present

## 2022-10-20 DIAGNOSIS — N2581 Secondary hyperparathyroidism of renal origin: Secondary | ICD-10-CM | POA: Diagnosis not present

## 2022-10-20 DIAGNOSIS — E669 Obesity, unspecified: Secondary | ICD-10-CM | POA: Diagnosis not present

## 2022-10-20 DIAGNOSIS — E1122 Type 2 diabetes mellitus with diabetic chronic kidney disease: Secondary | ICD-10-CM | POA: Diagnosis not present

## 2022-10-21 LAB — LAB REPORT - SCANNED
Albumin, Urine POC: <3
Creatinine, POC: 55 mg/dL
EGFR: 41

## 2022-10-26 LAB — BASIC METABOLIC PANEL: Creatinine: 1.7 — AB (ref 0.6–1.3)

## 2022-10-26 LAB — COMPREHENSIVE METABOLIC PANEL: eGFR: 41

## 2022-10-26 LAB — MICROALBUMIN / CREATININE URINE RATIO: Microalb Creat Ratio: <5

## 2022-11-04 ENCOUNTER — Encounter: Payer: Self-pay | Admitting: *Deleted

## 2022-11-08 ENCOUNTER — Encounter: Payer: Self-pay | Admitting: Podiatry

## 2022-11-08 ENCOUNTER — Ambulatory Visit: Payer: Medicare PPO | Admitting: Podiatry

## 2022-11-08 DIAGNOSIS — M79675 Pain in left toe(s): Secondary | ICD-10-CM

## 2022-11-08 DIAGNOSIS — M79674 Pain in right toe(s): Secondary | ICD-10-CM | POA: Diagnosis not present

## 2022-11-08 DIAGNOSIS — Z794 Long term (current) use of insulin: Secondary | ICD-10-CM

## 2022-11-08 DIAGNOSIS — B351 Tinea unguium: Secondary | ICD-10-CM | POA: Diagnosis not present

## 2022-11-08 DIAGNOSIS — E1122 Type 2 diabetes mellitus with diabetic chronic kidney disease: Secondary | ICD-10-CM | POA: Diagnosis not present

## 2022-11-08 DIAGNOSIS — N183 Chronic kidney disease, stage 3 unspecified: Secondary | ICD-10-CM

## 2022-11-08 NOTE — Progress Notes (Signed)
  Subjective:  Patient ID: James Glover, male    DOB: 1945/11/06,   MRN: [id number]  No chief complaint on file.   77 y.o. male presents for concern of thickened elongated and painful nails that are difficult to trim. Requesting to have them trimmed today. Denies any burning or tingling in her feet. Patient is diabetic and last A1c was 7.5  . Denies any other pedal complaints. Denies n/v/f/c.   PCP: Joselyn Arrow MD   Past Medical History:  Diagnosis Date   CKD (chronic kidney disease), stage III [facility])    nephrologist-- coladonato--- per lov note 03-08-2018 in epic, stable (baseline Cr 1.5 - 2)   Dyslipidemia    Erectile dysfunction    First degree heart block    GERD (gastroesophageal reflux disease)    Hemorrhoids    internal and external   History of nuclear stress test    05-26-2009 (by dr Alanda Amass due to HTN and DM2)--- normal study w/ no ischemia,  normal LV function and wall motion , ef 70%   Hyperplasia of prostate with lower urinary tract symptoms (LUTS)    Hypertension    Hypogonadism male    Iron deficiency    Neoplasm of uncertain behavior of right kidney    followed by dr t. Berneice Heinrich   Prostate cancer [facility]) urologist-  dr t. Berneice Heinrich  oncologsit-  dr Kathrynn Running   dx 05-30-2018-- Stage T1c, Gleason 4+5, High Risk   Renal cyst, left    followed by dr t. Berneice Heinrich--  chronic noncomplex   Skin cancer 11/2021   basal cell, R ear   Type 2 diabetes mellitus ([facility])    followed by pcp   Wears partial dentures    upper    Objective:  Physical Exam: Vascular: DP/PT pulses 2/4 bilateral. CFT <3 seconds. Normal hair growth on digits. No edema.  Skin. No lacerations or abrasions bilateral feet. Nails 1-5 are thickened discolored and elongated with subungual debris.  Musculoskeletal: MMT 5/5 bilateral lower extremities in DF, PF, Inversion and Eversion. Deceased ROM in DF of ankle joint.  Neurological: Sensation intact to light touch.   Assessment:   1. Pain due to onychomycosis of  toenails of both feet   2. Controlled type 2 diabetes mellitus with stage 3 chronic kidney disease, with long-term current use of insulin ([facility])        Plan:  Patient was evaluated and treated and all questions answered. -Discussed and educated patient on diabetic foot care, especially with  regards to the vascular, neurological and musculoskeletal systems.  -Stressed the importance of good glycemic control and the detriment of not  controlling glucose levels in relation to the foot. -Discussed supportive shoes at all times and checking feet regularly.  -Mechanically debrided all nails 1-5 bilateral using sterile nail nipper and filed with dremel without incident  -Answered all patient questions -Patient to return  in 3 months for at risk foot care -Patient advised to call the office if any problems or questions arise in the meantime.   Louann Sjogren, DPM

## 2022-11-25 ENCOUNTER — Other Ambulatory Visit: Payer: Self-pay | Admitting: Family Medicine

## 2023-01-19 ENCOUNTER — Ambulatory Visit: Payer: Medicare PPO | Admitting: Nurse Practitioner

## 2023-01-19 ENCOUNTER — Encounter: Payer: Self-pay | Admitting: Nurse Practitioner

## 2023-01-19 VITALS — BP 140/74 | HR 50 | Ht 75.5 in | Wt 242.8 lb

## 2023-01-19 DIAGNOSIS — Z794 Long term (current) use of insulin: Secondary | ICD-10-CM

## 2023-01-19 DIAGNOSIS — E1122 Type 2 diabetes mellitus with diabetic chronic kidney disease: Secondary | ICD-10-CM

## 2023-01-19 DIAGNOSIS — Z7984 Long term (current) use of oral hypoglycemic drugs: Secondary | ICD-10-CM

## 2023-01-19 DIAGNOSIS — N1832 Chronic kidney disease, stage 3b: Secondary | ICD-10-CM

## 2023-01-19 DIAGNOSIS — N183 Chronic kidney disease, stage 3 unspecified: Secondary | ICD-10-CM | POA: Diagnosis not present

## 2023-01-19 LAB — POCT GLYCOSYLATED HEMOGLOBIN (HGB A1C): Hemoglobin A1C: 7.8 % — AB (ref 4.0–5.6)

## 2023-01-19 MED ORDER — TRESIBA FLEXTOUCH 100 UNIT/ML ~~LOC~~ SOPN: 24.0000 [IU] | PEN_INJECTOR | Freq: Every day | SUBCUTANEOUS | 3 refills | Status: DC

## 2023-01-19 MED ORDER — ACCU-CHEK GUIDE TEST VI STRP: ORAL_STRIP | 12 refills | Status: DC

## 2023-01-19 MED ORDER — ACCU-CHEK SOFTCLIX LANCETS MISC: 12 refills | Status: AC

## 2023-01-19 MED ORDER — SAXAGLIPTIN HCL 2.5 MG PO TABS
2.5000 mg | ORAL_TABLET | Freq: Every day | ORAL | 1 refills | Status: DC
Start: 2023-01-19 — End: 2023-04-07

## 2023-01-19 MED ORDER — BD PEN NEEDLE NANO 2ND GEN 32G X 4 MM MISC: 2 refills | Status: DC

## 2023-01-19 NOTE — Progress Notes (Signed)
Endocrinology Follow Up Note       01/19/2023, 2:10 PM   Subjective:    Patient ID: James Glover, male    DOB: 02/15/1946.  James Glover is being seen in follow up after being seen in consultation for management of currently uncontrolled symptomatic diabetes requested by  Joselyn Arrow, MD.   Past Medical History:  Diagnosis Date   CKD (chronic kidney disease), stage III [facility])    nephrologist-- coladonato--- per lov note 03-08-2018 in epic, stable (baseline Cr 1.5 - 2)   Dyslipidemia    Erectile dysfunction    First degree heart block    GERD (gastroesophageal reflux disease)    Hemorrhoids    internal and external   History of nuclear stress test    05-26-2009 (by dr Alanda Amass due to HTN and DM2)--- normal study w/ no ischemia,  normal LV function and wall motion , ef 70%   Hyperplasia of prostate with lower urinary tract symptoms (LUTS)    Hypertension    Hypogonadism male    Iron deficiency    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 Berneice Heinrich   Prostate cancer [facility]) urologist-  dr t. Berneice Heinrich  oncologsit-  dr Kathrynn Running   dx 05-30-2018-- Stage T1c, Gleason 4+5, High Risk   Renal cyst, left    followed by dr t. Berneice Heinrich--  chronic noncomplex   Skin cancer 11/2021   basal cell, R ear   Type 2 diabetes mellitus (HCC)    followed by pcp   Wears partial dentures    upper    Past Surgical History:  Procedure Laterality Date   CHOLECYSTECTOMY N/A 01/14/2021   Procedure: LAPAROSCOPIC CHOLECYSTECTOMY; REMOVAL OF CHOLECYSTOSTOMY DRAIN;  Surgeon: Lucretia Roers, MD;  Location: [facility];  Service: General;  Laterality: N/A;   CIRCUMCISION  age 26   CYSTOSCOPY N/A 11/03/2018   Procedure: Derinda Late;  Surgeon: Sebastian Ache, MD;  Location: [facility];  Service: Urology;  Laterality: N/A;  NO SEEDS FOUND IN BLADDER   dental implants     IR EXCHANGE BILIARY DRAIN   12/29/2020   IR FLUORO GUIDED NEEDLE PLC ASPIRATION/INJECTION LOC  10/24/2020   IR GUIDED DRAIN W CATHETER PLACEMENT  10/24/2020   IR GUIDED DRAIN W CATHETER PLACEMENT  10/24/2020   IR PERC CHOLECYSTOSTOMY  10/24/2020   IR US GUIDE BX ASP/DRAIN  10/24/2020   IR US GUIDE BX ASP/DRAIN  10/24/2020   IR US GUIDE BX ASP/DRAIN  10/24/2020   KNEE ARTHROSCOPY Right 11/14/2017   dr rogers @[facility]    KNEE CARTILAGE SURGERY Right    right knee meniscus repair   RADIOACTIVE SEED IMPLANT N/A 11/03/2018   Procedure: RADIOACTIVE SEED IMPLANT/BRACHYTHERAPY IMPLANT;  Surgeon: Sebastian Ache, MD;  Location: [facility];  Service: Urology;  Laterality: N/A;       SEEDS IMPLANTED   SPACE OAR INSTILLATION N/A 11/03/2018   Procedure: SPACE OAR INSTILLATION;  Surgeon: Sebastian Ache, MD;  Location: [facility];  Service: Urology;  Laterality: N/A;    Social History   Socioeconomic History   Marital status: Married    Spouse name: Not  on file   Number of children: 0   Years of education: Not on file   Highest education level: Not on file  Occupational History   Occupation: police officer--retired    Employer: [employer]  Tobacco Use   Smoking status: Never   Smokeless tobacco: Never  Vaping Use   Vaping status: Never Used  Substance and Sexual Activity   Alcohol use: Yes    Alcohol/week: 3.0 standard drinks of alcohol    Types: 3 Glasses of wine per week    Comment: 1-2 glasses of wine per week   Drug use: Never   Sexual activity: Not Currently    Comment: ED  Other Topics Concern   Not on file  Social History Narrative   Retired 06/2011.  Lives with wife, 1 cat.   Has a place on [place], spends 1-2 weeks/month there   Has 9 siblings      Updated 03/2022   Social Determinants of Health   Financial Resource Strain: Low Risk  (04/29/2022)   Overall Financial Resource Strain (CARDIA)    Difficulty of Paying Living Expenses: Not very hard  Food  Insecurity: No Food Insecurity (04/29/2022)   Hunger Vital Sign    Worried About Running Out of Food in the Last Year: Never true    Ran Out of Food in the Last Year: Never true  Transportation Needs: No Transportation Needs (04/29/2022)   PRAPARE - Administrator, Civil Service (Medical): No    Lack of Transportation (Non-Medical): No  Physical Activity: Sufficiently Active (04/29/2022)   Exercise Vital Sign    Days of Exercise per Week: 7 days    Minutes of Exercise per Session: 120 min  Stress: Not on file  Social Connections: Not on file    Family History  Problem Relation Age of Onset   Dementia Mother    Diabetes Mother    Lymphoma Father    Cancer Father        lymphoma   Hypertension Father    Diabetes Sister    Healthy Sister    Lymphoma Brother    Cancer Brother        lymphoma   Cancer Brother        skin cancer   Diabetes Brother    Diabetes Brother    Kidney disease Brother    COPD Brother    Prostate cancer Brother 29   Lymphoma Paternal Grandfather    Cancer Paternal Grandfather        lymphoma   Heart disease Neg Hx     Outpatient Encounter Medications as of 01/19/2023  Medication Sig   Accu-Chek Softclix Lancets lancets Use as instructed to monitor glucose twice daily   amLODipine (NORVASC) 10 MG tablet TAKE 1 TABLET(10 MG) BY MOUTH DAILY   aspirin EC 81 MG tablet Take 81 mg by mouth daily. Swallow whole.   clotrimazole (LOTRIMIN) 1 % cream Apply 1 application  topically 2 (two) times daily as needed (jock itch).   furosemide (LASIX) 20 MG tablet Take 20 mg by mouth.   glucose blood (ACCU-CHEK GUIDE TEST) test strip Use as instructed to monitor glucose twice daily   Multiple Vitamins-Minerals (MULTIVITAMIN WITH MINERALS) tablet Take 1 tablet by mouth daily.   Omega-3 Fatty Acids (FISH OIL PO) Take 1,400 mg by mouth daily.   omeprazole (PRILOSEC) 40 MG capsule Take 1 capsule (40 mg total) by mouth daily.   oxybutynin (DITROPAN) 5 MG tablet  Take 5 mg by mouth daily.   simvastatin (ZOCOR) 20 MG tablet Take 1 tablet (20 mg total) by mouth daily.   tamsulosin (FLOMAX) 0.4 MG CAPS capsule Take 1 capsule (0.4 mg total) by mouth 2 (two) times daily after a meal. For urinary urgency or weak stream. (Patient taking differently: Take 0.4 mg by mouth daily. For urinary urgency or weak stream.)   valACYclovir (VALTREX) 1000 MG tablet TAKE 2 TABLETS BY MOUTH AT ONSET OF COLD SORE. REPEAT ONCE IN 12 HOURS (4 TABS/COURSE)   [DISCONTINUED] ACCU-CHEK GUIDE test strip USE AS DIRECTED   [DISCONTINUED] insulin degludec (TRESIBA FLEXTOUCH) 100 UNIT/ML FlexTouch Pen Inject 23 Units into the skin at bedtime.   [DISCONTINUED] Insulin Pen Needle (BD PEN NEEDLE NANO 2ND GEN) 32G X 4 MM MISC USE AS AS NEEDED AS DIRECTED   [DISCONTINUED] saxagliptin HCl (ONGLYZA) 2.5 MG TABS tablet Take 1 tablet (2.5 mg total) by mouth daily. TAKE 1 TABLET(2.5 MG) BY MOUTH DAILY   insulin degludec (TRESIBA FLEXTOUCH) 100 UNIT/ML FlexTouch Pen Inject 24 Units into the skin at bedtime.   Insulin Pen Needle (BD PEN NEEDLE NANO 2ND GEN) 32G X 4 MM MISC Use to inject insulin once daily   saxagliptin HCl (ONGLYZA) 2.5 MG TABS tablet Take 1 tablet (2.5 mg total) by mouth daily. TAKE 1 TABLET(2.5 MG) BY MOUTH DAILY   No facility-administered encounter medications on file as of 01/19/2023.    ALLERGIES: Allergies  Allergen Reactions   Ace Inhibitors Cough   Statins Cough    VACCINATION STATUS: Immunization History  Administered Date(s) Administered   DTaP 07/25/2008   Fluad Quad(high Dose 65+) 02/21/2020   Influenza Split 11/17/2011, 12/30/2013, 12/26/2014   Influenza, High Dose Seasonal PF 01/30/2013, 11/12/2015, 12/08/2016, 01/16/2018, 12/25/2018, 02/10/2021   Influenza-Unspecified 12/16/2016, 02/12/2021, 11/29/2021   Moderna SARS-COV2 Booster Vaccination 02/21/2020, 08/21/2020   Moderna Sars-Covid-2 Vaccination 05/15/2019, 06/12/2019   Pneumococcal Conjugate-13  03/04/2014   Pneumococcal Polysaccharide-23 11/29/2009, 04/09/2015   Tdap 07/25/2008, 05/04/2021   Zoster Recombinant(Shingrix) 05/22/2021, 07/06/2021   Zoster, Live 09/05/2013    Diabetes He presents for his follow-up diabetic visit. He has type 2 diabetes mellitus. Onset time: Diagnosed at approx age of 81. His disease course has been stable. There are no hypoglycemic associated symptoms. (Yeast infection in groin) There are no hypoglycemic complications. Symptoms are stable. Diabetic complications include nephropathy. Risk factors for coronary artery disease include diabetes mellitus, male sex and hypertension. Current diabetic treatment includes insulin injections and oral agent (dual therapy). He is compliant with treatment most of the time. His weight is fluctuating minimally. He is following a generally healthy diet. Meal planning includes avoidance of concentrated sweets and ADA exchanges. He has not had a previous visit with a dietitian. He participates in exercise daily. His home blood glucose trend is fluctuating minimally. His breakfast blood glucose range is generally 110-130 mg/dl. (He presents today with his meter and logs showing at goal fasting glycemic profile.  His POCT A1c today is 7.8%, increasing from last visit of 6.7%.  He denies any significant hypoglycemia, lowest reading noted was 82.  He has not been sick between visits, no steroid use since then.  He notes he is still active, walking every day.) An ACE inhibitor/angiotensin II receptor blocker is not being taken. He sees a podiatrist.Eye exam is current.    Review of systems  Constitutional: + Minimally fluctuating body weight,  current Body mass index is 29.95 kg/m. , no fatigue, no subjective hyperthermia, no subjective hypothermia  Eyes: no blurry vision, no xerophthalmia ENT: no sore throat, no nodules palpated in throat, no dysphagia/odynophagia, no hoarseness Cardiovascular: no chest pain, no shortness of breath, no  palpitations, no leg swelling Respiratory: no cough, no shortness of breath Gastrointestinal: no nausea/vomiting/diarrhea Musculoskeletal: no muscle/joint aches Skin: no rashes, no hyperemia Neurological: no tremors, no numbness, no tingling, no dizziness Psychiatric: no depression, no anxiety  Objective:     BP (!) 140/74 (BP Location: Right Arm, Patient Position: Sitting, Cuff Size: Large) Comment: Retake with a manuel cuff, patient has taken his BP medication, he follows Dr. Lynelle Doctor for HTN. Amaree Leeper made aware.  Pulse (!) 50   Ht 6' 3.5" (1.918 m)   Wt 242 lb 12.8 oz (110.1 kg)   BMI 29.95 kg/m   Wt Readings from Last 3 Encounters:  01/19/23 242 lb 12.8 oz (110.1 kg)  09/23/22 242 lb 3.2 oz (109.9 kg)  09/16/22 241 lb 12.8 oz (109.7 kg)     BP Readings from Last 3 Encounters:  01/19/23 (!) 140/74  09/23/22 126/72  09/16/22 132/70     Physical Exam- Limited  Constitutional:  Body mass index is 29.95 kg/m. , not in acute distress, normal state of mind Eyes:  EOMI, no exophthalmos Musculoskeletal: no gross deformities, strength intact in all four extremities, no gross restriction of joint movements Skin:  no rashes, no hyperemia Neurological: no tremor with outstretched hands   Diabetic Foot Exam - Simple   No data filed      CMP ( most recent) CMP     Component Value Date/Time   NA 139 09/03/2021 0828   K 4.6 09/03/2021 0828   CL 99 09/03/2021 0828   CO2 24 09/03/2021 0828   GLUCOSE 94 09/03/2021 0828   GLUCOSE 136 (H) 01/12/2021 1522   BUN 29 (H) 09/03/2021 0828   CREATININE 1.7 (A) 10/26/2022 0000   CREATININE 1.85 (H) 09/03/2021 0828   CREATININE 1.41 (H) 11/11/2020 1022   CALCIUM 9.2 09/03/2021 0828   PROT 7.0 09/03/2021 0828   ALBUMIN 4.1 09/03/2021 0828   AST 15 09/03/2021 0828   ALT 15 09/03/2021 0828   ALKPHOS 129 (H) 09/03/2021 0828   BILITOT 0.5 09/03/2021 0828   GFRNONAA 44 (L) 01/12/2021 1522   GFRAA 47 (L) 08/13/2019 0835     Diabetic  Labs (most recent): Lab Results  Component Value Date   HGBA1C 7.8 (A) 01/19/2023   HGBA1C 6.7 (A) 09/23/2022   HGBA1C 7.5 06/29/2022   MICROALBUR 14 05/26/2021   MICROALBUR 8.8 02/10/2021   MICROALBUR <4 02/13/2020     Lipid Panel ( most recent) Lipid Panel     Component Value Date/Time   CHOL 102 09/20/2022 1645   TRIG 66 09/20/2022 1645   HDL 39 (L) 09/20/2022 1645   CHOLHDL 2.6 09/20/2022 1645   CHOLHDL 2.4 02/02/2017 1205   VLDL 12 06/23/2016 1032   LDLCALC 49 09/20/2022 1645   LDLCALC 71 02/02/2017 1205   LABVLDL 14 09/20/2022 1645      Lab Results  Component Value Date   TSH 2.060 09/03/2021   TSH 3.010 08/19/2020   TSH 3.000 02/05/2019   TSH 2.580 08/31/2017   TSH 2.21 06/23/2016   TSH 1.99 04/09/2015   TSH 2.093 02/25/2014   TSH 2.021 01/30/2013   TSH 2.113 12/01/2011   TSH 1.681 08/26/2010           Assessment & Plan:   1) Type 2 diabetes mellitus with stage 3b chronic kidney disease, with  long-term current use of insulin (HCC)  He presents today with his meter and logs showing at goal fasting glycemic profile.  His POCT A1c today is 7.8%, increasing from last visit of 6.7%.  He denies any significant hypoglycemia, lowest reading noted was 82.  He has not been sick between visits, no steroid use since then.  He notes he is still active, walking every day.  - James Glover has currently uncontrolled symptomatic type 2 DM since 77 years of age.   -Recent labs reviewed.  - I had a long discussion with him about the progressive nature of diabetes and the pathology behind its complications. -his diabetes is complicated by CKD stage 3b (sees nephrology) and he remains at a high risk for more acute and chronic complications which include CAD, CVA, CKD, retinopathy, and neuropathy. These are all discussed in detail with him.  The following Lifestyle Medicine recommendations according to American College of Lifestyle Medicine [facility]) were discussed and  offered to patient and he agrees to start the journey:  A. Whole Foods, Plant-based plate comprising of fruits and vegetables, plant-based proteins, whole-grain carbohydrates was discussed in detail with the patient.   A list for source of those nutrients were also provided to the patient.  Patient will use only water or unsweetened tea for hydration. B.  The need to stay away from risky substances including alcohol, smoking; obtaining 7 to 9 hours of restorative sleep, at least 150 minutes of moderate intensity exercise weekly, the importance of healthy social connections,  and stress reduction techniques were discussed. C.  A full color page of  Calorie density of various food groups per pound showing examples of each food groups was provided to the patient.  - Nutritional counseling repeated at each appointment due to patients tendency to fall back in to old habits.  - The patient admits there is a room for improvement in their diet and drink choices. -  Suggestion is made for the patient to avoid simple carbohydrates from their diet including Cakes, Sweet Desserts / Pastries, Ice Cream, Soda (diet and regular), Sweet Tea, Candies, Chips, Cookies, Sweet Pastries, Store Bought Juices, Alcohol in Excess of 1-2 drinks a day, Artificial Sweeteners, Coffee Creamer, and "Sugar-free" Products. This will help patient to have stable blood glucose profile and potentially avoid unintended weight gain.   - I encouraged the patient to switch to unprocessed or minimally processed complex starch and increased protein intake (animal or plant source), fruits, and vegetables.   - Patient is advised to stick to a routine mealtimes to eat 3 meals a day and avoid unnecessary snacks (to snack only to correct hypoglycemia).  - I have approached him with the following individualized plan to manage his diabetes and patient agrees:   -He is advised to increase his Evaristo Bury slightly to 24 units SQ nightly and continue his  Onglyza 2.5 mg daily.  -he is encouraged to continue monitoring glucose at least once daily, before breakfast, and to call the clinic if he has readings less than 70 or above 300 for 3 tests in a row.   - he is warned not to take insulin without proper monitoring per orders. - Adjustment parameters are given to him for hypo and hyperglycemia in writing.  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 - he is not a candidate for Metformin due to concurrent renal insufficiency.  - Specific targets for  A1c; LDL, HDL, and Triglycerides were discussed with the patient.  2) Blood Pressure /Hypertension:  his blood pressure is controlled to target.   he is advised to continue his current medications including Norvasc 10 mg p.o. daily with breakfast.  3) Lipids/Hyperlipidemia:    Review of his recent lipid panel from 09/20/22 showed controlled LDL at 49 .  he is advised to continue Simvastatin 20 mg daily at bedtime.  Side effects and precautions discussed with him.  4)  Weight/Diet:  his Body mass index is 29.95 kg/m.  -  clearly complicating his diabetes care.   he is a candidate for weight loss. I discussed with him the fact that loss of 5 - 10% of his  current body weight will have the most impact on his diabetes management.  Exercise, and detailed carbohydrates information provided  -  detailed on discharge instructions.  5) Chronic Care/Health Maintenance: -he is not on ACEI/ARB and is on Statin medications and is encouraged to initiate and continue to follow up with Ophthalmology, Dentist, Podiatrist at least yearly or according to recommendations, and advised to stay away from smoking. I have recommended yearly flu vaccine and pneumonia vaccine at least every 5 years; moderate intensity exercise for up to 150 minutes weekly; and sleep for at least 7 hours a day.  - he is advised to  maintain close follow up with Joselyn Arrow, MD for primary care needs, as well as his other providers for optimal and coordinated care.     I spent  43  minutes in the care of the patient today including review of labs from CMP, Lipids, Thyroid Function, Hematology (current and previous including abstractions from other facilities); face-to-face time discussing  his blood glucose readings/logs, discussing hypoglycemia and hyperglycemia episodes and symptoms, medications doses, his options of short and long term treatment based on the latest standards of care / guidelines;  discussion about incorporating lifestyle medicine;  and documenting the encounter. Risk reduction counseling performed per USPSTF guidelines to reduce obesity and cardiovascular risk factors.     Please refer to Patient Instructions for Blood Glucose Monitoring and Insulin/Medications Dosing Guide"  in media tab for additional information. Please  also refer to " Patient Self Inventory" in the Media  tab for reviewed elements of pertinent patient history.  James Glover participated in the discussions, expressed understanding, and voiced agreement with the above plans.  All questions were answered to his satisfaction. he is encouraged to contact clinic should he have any questions or concerns prior to his return visit.     Follow up plan: - Return in about 4 months (around 05/19/2023) for Diabetes F/U with A1c in office, No previsit labs, Bring meter and logs.   Ronny Bacon, [facility] [address] [place], [place] [address] Phone: [phone] Fax: [phone]  01/19/2023, 2:10 PM

## 2023-01-27 ENCOUNTER — Other Ambulatory Visit: Payer: Self-pay | Admitting: Family Medicine

## 2023-01-27 DIAGNOSIS — I10 Essential (primary) hypertension: Secondary | ICD-10-CM

## 2023-02-10 DIAGNOSIS — N2581 Secondary hyperparathyroidism of renal origin: Secondary | ICD-10-CM | POA: Diagnosis not present

## 2023-02-10 DIAGNOSIS — E669 Obesity, unspecified: Secondary | ICD-10-CM | POA: Diagnosis not present

## 2023-02-10 DIAGNOSIS — N179 Acute kidney failure, unspecified: Secondary | ICD-10-CM | POA: Diagnosis not present

## 2023-02-10 DIAGNOSIS — E1122 Type 2 diabetes mellitus with diabetic chronic kidney disease: Secondary | ICD-10-CM | POA: Diagnosis not present

## 2023-02-10 DIAGNOSIS — N1831 Chronic kidney disease, stage 3a: Secondary | ICD-10-CM | POA: Diagnosis not present

## 2023-02-10 DIAGNOSIS — N189 Chronic kidney disease, unspecified: Secondary | ICD-10-CM | POA: Diagnosis not present

## 2023-02-10 DIAGNOSIS — I10 Essential (primary) hypertension: Secondary | ICD-10-CM | POA: Diagnosis not present

## 2023-02-10 DIAGNOSIS — I129 Hypertensive chronic kidney disease with stage 1 through stage 4 chronic kidney disease, or unspecified chronic kidney disease: Secondary | ICD-10-CM | POA: Diagnosis not present

## 2023-02-10 DIAGNOSIS — E785 Hyperlipidemia, unspecified: Secondary | ICD-10-CM | POA: Diagnosis not present

## 2023-02-11 LAB — LAB REPORT - SCANNED
Albumin, Urine POC: 3
Creatinine, POC: 55 mg/dL
EGFR: 44
Microalb Creat Ratio: 5

## 2023-03-07 ENCOUNTER — Ambulatory Visit (INDEPENDENT_AMBULATORY_CARE_PROVIDER_SITE_OTHER): Payer: Medicare PPO | Admitting: Podiatry

## 2023-03-07 ENCOUNTER — Encounter: Payer: Self-pay | Admitting: Podiatry

## 2023-03-07 DIAGNOSIS — Z794 Long term (current) use of insulin: Secondary | ICD-10-CM

## 2023-03-07 DIAGNOSIS — E1122 Type 2 diabetes mellitus with diabetic chronic kidney disease: Secondary | ICD-10-CM | POA: Diagnosis not present

## 2023-03-07 DIAGNOSIS — M79675 Pain in left toe(s): Secondary | ICD-10-CM

## 2023-03-07 DIAGNOSIS — M79674 Pain in right toe(s): Secondary | ICD-10-CM | POA: Diagnosis not present

## 2023-03-07 DIAGNOSIS — B351 Tinea unguium: Secondary | ICD-10-CM | POA: Diagnosis not present

## 2023-03-07 DIAGNOSIS — N183 Chronic kidney disease, stage 3 unspecified: Secondary | ICD-10-CM

## 2023-03-07 NOTE — Progress Notes (Signed)
  Subjective:  Patient ID: James Glover, male    DOB: 1945/11/06,   MRN: [id number]  No chief complaint on file.   78 y.o. male presents for concern of thickened elongated and painful nails that are difficult to trim. Requesting to have them trimmed today. Denies any burning or tingling in her feet. Patient is diabetic and last A1c was 7.5  . Denies any other pedal complaints. Denies n/v/f/c.   PCP: Joselyn Arrow MD   Past Medical History:  Diagnosis Date   CKD (chronic kidney disease), stage III [facility])    nephrologist-- coladonato--- per lov note 03-08-2018 in epic, stable (baseline Cr 1.5 - 2)   Dyslipidemia    Erectile dysfunction    First degree heart block    GERD (gastroesophageal reflux disease)    Hemorrhoids    internal and external   History of nuclear stress test    05-26-2009 (by dr Alanda Amass due to HTN and DM2)--- normal study w/ no ischemia,  normal LV function and wall motion , ef 70%   Hyperplasia of prostate with lower urinary tract symptoms (LUTS)    Hypertension    Hypogonadism male    Iron deficiency    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 Berneice Heinrich   Prostate cancer [facility]) urologist-  dr t. Berneice Heinrich  oncologsit-  dr Kathrynn Running   dx 05-30-2018-- Stage T1c, Gleason 4+5, High Risk   Renal cyst, left    followed by dr t. Berneice Heinrich--  chronic noncomplex   Skin cancer 11/2021   basal cell, R ear   Type 2 diabetes mellitus (HCC)    followed by pcp   Wears partial dentures    upper    Objective:  Physical Exam: Vascular: DP/PT pulses 2/4 bilateral. CFT <3 seconds. Normal hair growth on digits. No edema.  Skin. No lacerations or abrasions bilateral feet. Nails 1-5 are thickened discolored and elongated with subungual debris.  Musculoskeletal: MMT 5/5 bilateral lower extremities in DF, PF, Inversion and Eversion. Deceased ROM in DF of ankle joint.  Neurological: Sensation intact to light touch.   Assessment:   1. Pain due to onychomycosis of  toenails of both feet   2. Controlled type 2 diabetes mellitus with stage 3 chronic kidney disease, with long-term current use of insulin (HCC)        Plan:  Patient was evaluated and treated and all questions answered. -Discussed and educated patient on diabetic foot care, especially with  regards to the vascular, neurological and musculoskeletal systems.  -Stressed the importance of good glycemic control and the detriment of not  controlling glucose levels in relation to the foot. -Discussed supportive shoes at all times and checking feet regularly.  -Mechanically debrided all nails 1-5 bilateral using sterile nail nipper and filed with dremel without incident  -Answered all patient questions -Patient to return  in 3 months for at risk foot care -Patient advised to call the office if any problems or questions arise in the meantime.   Louann Sjogren, DPM

## 2023-03-20 NOTE — Progress Notes (Unsigned)
No chief complaint on file.  James Glover is a 78 y.o. male who presents for annual physical exam, Medicare wellness visit and follow-up on chronic medical conditions.     DM--He is now under the care of endocrinologist. He was last seen in November, at which time his A1c was up to 7.8% (up from 6.7% in July 2024). His Evaristo Bury was increased by 1 U to 24U at bedtime, and he continues on Onglyza 2.5 mg daily. Endocrinology had previously stopped the [place] due to fungal rashes.    Has CGM? Sugars are running   He denies hypoglycemia, polydipsia, polyuria. He sees the podiatrist for routine foot care, denies foot concerns. Last diabetic eye exam was 04/2022, no retinopathy.    Hypertension and CKD stage 3, under the care of nephro.  Last labs showed Cr 1.6 in December. He is compliant with amlodipine and furosemide, and denies side effects. He tries to follow a low sodium diet. He denies headaches, dizziness, chest pain, shortness of breath. He denies any edema. BP is checked infrequently ***   BP Readings from Last 3 Encounters:  01/19/23 (!) 140/74  09/23/22 126/72  09/16/22 132/70      B12 deficiency--level was low in 01/2019 (294). Improved when taking daily MVI.  He continues on multivitamin, no separate B12.    Lab Results  Component Value Date   VITAMINB12 986 09/03/2021    GERD:  He decreased his PPI from BID to once at night. He continues to do well at this dose, without any heartburn, cough, throat-clearing.  He denies dysphagia.    Hyperlipidemia: he is compliant with taking simvastatin, denies side effects. Lipids were at goal on last check. Aortic atherosclerosis was incidentally noted on abdominal CT 09/2020.  Lab Results  Component Value Date   CHOL 102 09/20/2022   HDL 39 (L) 09/20/2022   LDLCALC 49 09/20/2022   TRIG 66 09/20/2022   CHOLHDL 2.6 09/20/2022    Prostate cancer: diagnosed in 04/2018.  He is s/p radiation treatments. He was treated with q 6  month Lupron injections for 2 years, stopped in 07/2020.  Last PSA was <0.01 in 07/2022. Last seen by urologist in 07/2022, and had renal US--stable small R renal mass, and cysts.  Plan for yearly PSA through 2030, recheck renal US 1 year.   Immunization History  Administered Date(s) Administered   DTaP 07/25/2008   Fluad Quad(high Dose 65+) 02/21/2020   Influenza Split 11/17/2011, 12/30/2013, 12/26/2014   Influenza, High Dose Seasonal PF 01/30/2013, 11/12/2015, 12/08/2016, 01/16/2018, 12/25/2018, 02/10/2021   Influenza-Unspecified 12/16/2016, 02/12/2021, 11/29/2021, 11/16/2022   Moderna SARS-COV2 Booster Vaccination 02/21/2020, 08/21/2020   Moderna Sars-Covid-2 Vaccination 05/15/2019, 06/12/2019, 02/21/2020, 08/21/2020   Pneumococcal Conjugate-13 03/04/2014   Pneumococcal Polysaccharide-23 11/29/2009, 04/09/2015   Tdap 07/25/2008, 05/04/2021   Zoster Recombinant(Shingrix) 05/22/2021, 07/06/2021   Zoster, Live 09/05/2013   Last colonoscopy: 05/05/10. Negative Cologuard in 07/2020. Last PSA: <0.01 in June 2024 by urologist (plan is yearly PSA through 2030) Dentist: goes 2-3 times per year.  Ophtho: yearly (last 04/2021) Exercise:   walks 1/2 - 1 mile daily 10-20 minutes.  He is also doing home exercises (squats, leg lifts), uses weights. Also does upper body exercises with weights.   Patient Care Team: Joselyn Arrow, MD as PCP - General (Family Medicine) Charna Elizabeth, MD as Consulting Physician (Gastroenterology) Felicita Gage, RN Nurse Navigator as Registered Nurse (Medical Oncology) Lucretia Roers, MD as Consulting Physician (General Surgery) Dr. Mannie Stabile DDS as Consulting  Physician (Dentistry) Janet Berlin, MD as Consulting Physician (Ophthalmology) Terrial Rhodes, MD as Consulting Physician (Nephrology) Lennette Bihari, MD as Consulting Physician (Cardiology) [place], Elvin So, MD as Referring Physician (Dermatology) Loletta Parish., MD as Consulting  Physician (Urology) Margaretmary Dys, MD as Consulting Physician (Radiation Oncology) Yolonda Kida, MD as Consulting Physician (Orthopedic Surgery) Delfin Gant, MD as Attending Physician (Family Medicine) Sherrill Raring, [facility] (Pharmacist) Dentist--Dr. Leretha Pol  Depression Screening: Flowsheet Row Video Visit from 08/27/2022 in [facility]  PHQ-2 Total Score 0        Falls screen:     03/18/2022    1:37 PM 09/07/2021    2:32 PM 05/28/2021    2:44 PM 05/06/2021    9:51 AM 03/09/2021    1:41 PM  Fall Risk   Falls in the past year? 0 0 1 0 0  Number falls in past yr: 0 0 0 0 0  Comment   Sept 22-tripped over chair    Injury with Fall? 0 0 0 0 0  Risk for fall due to : No Fall Risks No Fall Risks No Fall Risks Medication side effect No Fall Risks  Follow up Falls evaluation completed Falls evaluation completed Falls evaluation completed Falls evaluation completed Falls evaluation completed     Functional Status Survey:        No major memory concerns (slower recall with Jeopardy)--takes longer to recall, wishes there was a multiple choice option!    End of Life Discussion:  Patient has living will and [facility] POA, scanned in chart.  PMH, PSH, SH and FH reviewed and updated.    ROS: The patient denies anorexia, fever, headaches, vision loss, decreased hearing, ear pain, hoarseness, chest pain, palpitations, dizziness, syncope, dyspnea on exertion, cough, swelling, nausea, vomiting, diarrhea, constipation, abdominal pain, melena, hematochezia, hematuria, incontinence, weakened urine stream, dysuria, numbness, tingling, weakness, tremor, depression, anxiety, abnormal bleeding/bruising, or enlarged lymph nodes.    Up 3-4 times/night to void, and every 2-3 hours during the day. This is tolerable, managed by urologist. +ED--sildenafil no longer effective, some decreased libido. (Urology addresses) Seasonal allergies, not flaring now. GERD is controlled with  nightly PPI.   R knee pain only after walking a mile.    PHYSICAL EXAM:  There were no vitals taken for this visit.  Wt Readings from Last 3 Encounters:  01/19/23 242 lb 12.8 oz (110.1 kg)  09/23/22 242 lb 3.2 oz (109.9 kg)  09/16/22 241 lb 12.8 oz (109.7 kg)   General Appearance:    Alert, cooperative, no distress, appears stated age     Head:    Normocephalic, without obvious abnormality, atraumatic     Eyes:    PERRL, conjunctiva/corneas clear, EOM's intact, fundi benign     Ears:    Normal TM's and external ear canals.   Nose:    Normal, no drainage or sinus tenderness   Throat:    Normal mucosa  Neck:    Supple, no lymphadenopathy; thyroid: no enlargement/ tenderness/nodules; no carotid bruit or JVD     Back:    Spine nontender, no curvature, ROM normal, no CVA tenderness   Lungs:    Clear to auscultation bilaterally without wheezes, rales or ronchi; respirations unlabored     Chest Wall:    No tenderness or deformity     Heart:    Regular rhythm, S1 and S2 normal, no murmur, rub or gallop. Bradycardic, frequent ectopy, mostly sounds like bigeminy  Breast Exam:    No nipple tenderness, discharge, no breast masses     Abdomen:    Soft, non-tender, nondistended, normoactive bowel sounds, no masses, no hepatosplenomegaly. WHSS.  Genitalia:    Deferred to urologist     Rectal:    Deferred to urologist    Extremities:    No clubbing, cyanosis or edema  Pulses:    2+ and symmetric all extremities     Skin:    Skin is dry/flaky, especially on lower legs; no rashes or lesions on legs/feet.  EIC starting to recur within scar at L upper back.   No rash at groin  Lymph nodes:    Cervical, supraclavicular nodes normal     Neurologic:    Normal strength, sensation and gait; reflexes 2+ and symmetric throughout                        Psych:   Normal mood, affect, hygiene and grooming        Diabetic foot exam-- L 2nd toenail is discolored (black--chronic per pt, since an injury).  Long, thickened toenails bilaterally  ***DIABETIC FOOT EXAM ***UPDATE heart--bradycardia/ectopy/bigeminy?? UPDATE SKIN  He had labs done 02/10/23 for nephro, results reviewed, scanned in chart.   ASSESSMENT/PLAN:  COVID booster Prevnar-20 Did he get RSV?  Diabetic foot exam  Recommended at least 30 minutes of aerobic activity at least 5 days/week, weight-bearing exercise at least 2x/week; proper sunscreen use reviewed; healthy diet and alcohol recommendations (less than or equal to 2 drinks/day) reviewed; regular seatbelt use; changing batteries in smoke detectors. Immunization recommendations discussed--Continue yearly flu shots.  Bivalent COVID vaccine recommended (declined today).  Recommended RSV from pharmacy now.  Colon cancer screening is UTD, had Cologuard in 07/2023.   MOST form updated and reviewed.  Full Code, Full care   F/u 6 months, med check, 1 year CPE/AWV   Medicare Attestation I have personally reviewed: The patient's medical and social history Their use of alcohol, tobacco or illicit drugs Their current medications and supplements The patient's functional ability including ADLs,fall risks, home safety risks, cognitive, and hearing and visual impairment Diet and physical activities Evidence for depression or mood disorders  The patient's weight, height, BMI have been recorded in the chart.  I have made referrals, counseling, and provided education to the patient based on review of the above and I have provided the patient with a written personalized care plan for preventive services.

## 2023-03-20 NOTE — Patient Instructions (Incomplete)
HEALTH MAINTENANCE RECOMMENDATIONS:  It is recommended that you get at least 30 minutes of aerobic exercise at least 5 days/week (for weight loss, you may need as much as 60-90 minutes). This can be any activity that gets your heart rate up. This can be divided in 10-15 minute intervals if needed, but try and build up your endurance at least once a week.  Weight bearing exercise is also recommended twice weekly.  Eat a healthy diet with lots of vegetables, fruits and fiber.  "Colorful" foods have a lot of vitamins (ie green vegetables, tomatoes, red peppers, etc).  Limit sweet tea, regular sodas and alcoholic beverages, all of which has a lot of calories and sugar.  Up to 2 alcoholic drinks daily may be beneficial for men (unless trying to lose weight, watch sugars).  Drink a lot of water.  Sunscreen of at least SPF 30 should be used on all sun-exposed parts of the skin when outside between the hours of 10 am and 4 pm (not just when at beach or pool, but even with exercise, golf, tennis, and yard work!)  Use a sunscreen that says "broad spectrum" so it covers both UVA and UVB rays, and make sure to reapply every 1-2 hours.  Remember to change the batteries in your smoke detectors when changing your clock times in the spring and fall.  Carbon monoxide detectors are recommended for your home.  Use your seat belt every time you are in a car, and please drive safely and not be distracted with cell phones and texting while driving.    James Glover , Thank you for taking time to come for your Medicare Wellness Visit. I appreciate your ongoing commitment to your health goals. Please review the following plan we discussed and let me know if I can assist you in the future.   This is a list of the screening recommended for you and due dates:  Health Maintenance  Topic Date Due   Complete foot exam   03/19/2023   COVID-19 Vaccine (3 - Moderna risk series) 04/02/2023*   Eye exam for diabetics  05/17/2023    Hemoglobin A1C  07/19/2023   Yearly kidney function blood test for diabetes  02/10/2024   Yearly kidney health urinalysis for diabetes  02/10/2024   Medicare Annual Wellness Visit  03/20/2024   DTaP/Tdap/Td vaccine (4 - Td or Tdap) 05/05/2031   Pneumonia Vaccine  Completed   Flu Shot  Completed   Hepatitis C Screening  Completed   Zoster (Shingles) Vaccine  Completed   HPV Vaccine  Aged Out   Colon Cancer Screening  Discontinued   Cologuard (Stool DNA test)  Discontinued  *Topic was postponed. The date shown is not the original due date.   Colon cancer screening with Cologuard can be considered again in June (3 years from last Cologuard)  RSV vaccine is recommended.  You will need to get this from the pharmacy. I recommend getting soon--waiting 2 weeks from today's shot (we are in the midst of the season)  Follow up with your dermatologist for the lesion on your face. The cyst drained pretty completely--now just needs to heal up. Let us know if you develop pain, fever, or purulent drainage from the remaining cyst/wound (yellow-green).  Check with your endocrinologist about the continuous glucose monitor--I think that would be a great thing for you, if insurance covers it. In the meantime, I want you checking your sugars twice a day--in the morning, and either 2 hours after  lunch, or 2 hours after dinner/bedtime. Record both values and bring your readings to your endocrinologist (or call them, rather than waiting for your appointment, if the pm readings are >160-170 consistently.Marland Kitchen

## 2023-03-21 ENCOUNTER — Encounter: Payer: Self-pay | Admitting: Family Medicine

## 2023-03-21 ENCOUNTER — Ambulatory Visit: Payer: Medicare PPO | Admitting: Family Medicine

## 2023-03-21 VITALS — BP 128/68 | HR 76 | Ht 75.5 in | Wt 250.4 lb

## 2023-03-21 DIAGNOSIS — E785 Hyperlipidemia, unspecified: Secondary | ICD-10-CM

## 2023-03-21 DIAGNOSIS — N2581 Secondary hyperparathyroidism of renal origin: Secondary | ICD-10-CM

## 2023-03-21 DIAGNOSIS — L989 Disorder of the skin and subcutaneous tissue, unspecified: Secondary | ICD-10-CM

## 2023-03-21 DIAGNOSIS — Z23 Encounter for immunization: Secondary | ICD-10-CM | POA: Diagnosis not present

## 2023-03-21 DIAGNOSIS — L723 Sebaceous cyst: Secondary | ICD-10-CM

## 2023-03-21 DIAGNOSIS — I10 Essential (primary) hypertension: Secondary | ICD-10-CM

## 2023-03-21 DIAGNOSIS — N529 Male erectile dysfunction, unspecified: Secondary | ICD-10-CM | POA: Diagnosis not present

## 2023-03-21 DIAGNOSIS — Z0000 Encounter for general adult medical examination without abnormal findings: Secondary | ICD-10-CM

## 2023-03-21 DIAGNOSIS — Z5181 Encounter for therapeutic drug level monitoring: Secondary | ICD-10-CM

## 2023-03-21 DIAGNOSIS — Z1211 Encounter for screening for malignant neoplasm of colon: Secondary | ICD-10-CM

## 2023-03-21 DIAGNOSIS — D519 Vitamin B12 deficiency anemia, unspecified: Secondary | ICD-10-CM

## 2023-03-21 DIAGNOSIS — I700 Atherosclerosis of aorta: Secondary | ICD-10-CM

## 2023-03-21 DIAGNOSIS — E1122 Type 2 diabetes mellitus with diabetic chronic kidney disease: Secondary | ICD-10-CM

## 2023-03-21 DIAGNOSIS — K219 Gastro-esophageal reflux disease without esophagitis: Secondary | ICD-10-CM

## 2023-03-21 DIAGNOSIS — Z8546 Personal history of malignant neoplasm of prostate: Secondary | ICD-10-CM

## 2023-03-21 DIAGNOSIS — Z794 Long term (current) use of insulin: Secondary | ICD-10-CM

## 2023-03-21 MED ORDER — OMEPRAZOLE 40 MG PO CPDR: 40.0000 mg | DELAYED_RELEASE_CAPSULE | Freq: Every day | ORAL | 1 refills | Status: DC

## 2023-03-21 MED ORDER — AMLODIPINE BESYLATE 10 MG PO TABS: ORAL_TABLET | ORAL | 1 refills | Status: DC

## 2023-03-22 ENCOUNTER — Other Ambulatory Visit: Payer: Self-pay | Admitting: Family Medicine

## 2023-03-22 DIAGNOSIS — K219 Gastro-esophageal reflux disease without esophagitis: Secondary | ICD-10-CM

## 2023-04-07 ENCOUNTER — Other Ambulatory Visit: Payer: Self-pay | Admitting: Nurse Practitioner

## 2023-04-07 DIAGNOSIS — N183 Chronic kidney disease, stage 3 unspecified: Secondary | ICD-10-CM

## 2023-04-23 ENCOUNTER — Other Ambulatory Visit: Payer: Self-pay | Admitting: Family Medicine

## 2023-04-27 DIAGNOSIS — L910 Hypertrophic scar: Secondary | ICD-10-CM | POA: Diagnosis not present

## 2023-04-27 DIAGNOSIS — L570 Actinic keratosis: Secondary | ICD-10-CM | POA: Diagnosis not present

## 2023-04-27 DIAGNOSIS — D485 Neoplasm of uncertain behavior of skin: Secondary | ICD-10-CM | POA: Diagnosis not present

## 2023-04-27 DIAGNOSIS — C44329 Squamous cell carcinoma of skin of other parts of face: Secondary | ICD-10-CM | POA: Diagnosis not present

## 2023-05-18 DIAGNOSIS — E119 Type 2 diabetes mellitus without complications: Secondary | ICD-10-CM | POA: Diagnosis not present

## 2023-05-18 LAB — HM DIABETES EYE EXAM

## 2023-05-19 ENCOUNTER — Encounter: Payer: Self-pay | Admitting: Nurse Practitioner

## 2023-05-19 ENCOUNTER — Ambulatory Visit: Payer: Medicare PPO | Admitting: Nurse Practitioner

## 2023-05-19 VITALS — BP 124/86 | HR 76 | Ht 75.5 in | Wt 248.0 lb

## 2023-05-19 DIAGNOSIS — E1122 Type 2 diabetes mellitus with diabetic chronic kidney disease: Secondary | ICD-10-CM | POA: Diagnosis not present

## 2023-05-19 DIAGNOSIS — N1832 Chronic kidney disease, stage 3b: Secondary | ICD-10-CM | POA: Diagnosis not present

## 2023-05-19 DIAGNOSIS — Z7984 Long term (current) use of oral hypoglycemic drugs: Secondary | ICD-10-CM

## 2023-05-19 DIAGNOSIS — Z794 Long term (current) use of insulin: Secondary | ICD-10-CM

## 2023-05-19 DIAGNOSIS — E782 Mixed hyperlipidemia: Secondary | ICD-10-CM

## 2023-05-19 DIAGNOSIS — N183 Chronic kidney disease, stage 3 unspecified: Secondary | ICD-10-CM

## 2023-05-19 DIAGNOSIS — I10 Essential (primary) hypertension: Secondary | ICD-10-CM | POA: Diagnosis not present

## 2023-05-19 LAB — POCT GLYCOSYLATED HEMOGLOBIN (HGB A1C): Hemoglobin A1C: 7.3 % — AB (ref 4.0–5.6)

## 2023-05-19 MED ORDER — SAXAGLIPTIN HCL 2.5 MG PO TABS: 2.5000 mg | ORAL_TABLET | Freq: Every day | ORAL | 3 refills | Status: DC

## 2023-05-19 NOTE — Progress Notes (Signed)
 Endocrinology Follow Up Note       05/19/2023, 1:49 PM   Subjective:    Patient ID: James Glover, male    DOB: [birth year].  James Glover is being seen in follow up after being seen in consultation for management of currently uncontrolled symptomatic diabetes requested by  Joselyn Arrow, MD.   Past Medical History:  Diagnosis Date   CKD (chronic kidney disease), stage III [facility])    nephrologist-- coladonato--- per lov note 03-08-2018 in epic, stable (baseline Cr 1.5 - 2)   Dyslipidemia    Erectile dysfunction    First degree heart block    GERD (gastroesophageal reflux disease)    Hemorrhoids    internal and external   History of nuclear stress test    05-26-2009 (by dr Alanda Amass due to HTN and DM2)--- normal study w/ no ischemia,  normal LV function and wall motion , ef 70%   Hyperplasia of prostate with lower urinary tract symptoms (LUTS)    Hypertension    Hypogonadism male    Iron deficiency    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 Berneice Heinrich   Prostate cancer [facility]) urologist-  dr t. Berneice Heinrich  oncologsit-  dr Kathrynn Running   dx 05-30-2018-- Stage T1c, Gleason 4+5, High Risk   Renal cyst, left    followed by dr t. Berneice Heinrich--  chronic noncomplex   Skin cancer 11/2021   basal cell, R ear   Type 2 diabetes mellitus (HCC)    followed by pcp   Wears partial dentures    upper    Past Surgical History:  Procedure Laterality Date   CHOLECYSTECTOMY N/A 01/14/2021   Procedure: LAPAROSCOPIC CHOLECYSTECTOMY; REMOVAL OF CHOLECYSTOSTOMY DRAIN;  Surgeon: Lucretia Roers, MD;  Location: [facility];  Service: General;  Laterality: N/A;   CIRCUMCISION  age 78   CYSTOSCOPY N/A 11/03/2018   Procedure: Derinda Late;  Surgeon: Sebastian Ache, MD;  Location: [facility];  Service: Urology;  Laterality: N/A;  NO SEEDS FOUND IN BLADDER   dental implants     IR EXCHANGE BILIARY DRAIN   12/29/2020   IR FLUORO GUIDED NEEDLE PLC ASPIRATION/INJECTION LOC  10/24/2020   IR GUIDED DRAIN W CATHETER PLACEMENT  10/24/2020   IR GUIDED DRAIN W CATHETER PLACEMENT  10/24/2020   IR PERC CHOLECYSTOSTOMY  10/24/2020   IR US GUIDE BX ASP/DRAIN  10/24/2020   IR US GUIDE BX ASP/DRAIN  10/24/2020   IR US GUIDE BX ASP/DRAIN  10/24/2020   KNEE ARTHROSCOPY Right 11/14/2017   dr rogers @[facility]    KNEE CARTILAGE SURGERY Right    right knee meniscus repair   RADIOACTIVE SEED IMPLANT N/A 11/03/2018   Procedure: RADIOACTIVE SEED IMPLANT/BRACHYTHERAPY IMPLANT;  Surgeon: Sebastian Ache, MD;  Location: [facility];  Service: Urology;  Laterality: N/A;       SEEDS IMPLANTED   SPACE OAR INSTILLATION N/A 11/03/2018   Procedure: SPACE OAR INSTILLATION;  Surgeon: Sebastian Ache, MD;  Location: [facility];  Service: Urology;  Laterality: N/A;    Social History   Socioeconomic History   Marital status: Married    Spouse name: Not  on file   Number of children: 0   Years of education: Not on file   Highest education level: Not on file  Occupational History   Occupation: police officer--retired    Employer: [employer]  Tobacco Use   Smoking status: Never   Smokeless tobacco: Never  Vaping Use   Vaping status: Never Used  Substance and Sexual Activity   Alcohol use: Yes    Alcohol/week: 3.0 standard drinks of alcohol    Types: 3 Glasses of wine per week    Comment: 2-4 glasses of wine per week   Drug use: Never   Sexual activity: Not Currently    Comment: ED  Other Topics Concern   Not on file  Social History Narrative   Retired 06/2011.  Lives with wife.   Has a place on [place], spends 1-2 weeks/month there   Has 9 siblings      Updated 03/2023   Social Drivers of Health   Financial Resource Strain: Low Risk  (03/21/2023)   Overall Financial Resource Strain (CARDIA)    Difficulty of Paying Living Expenses: Not very hard  Food Insecurity: No Food  Insecurity (03/21/2023)   Hunger Vital Sign    Worried About Running Out of Food in the Last Year: Never true    Ran Out of Food in the Last Year: Never true  Transportation Needs: No Transportation Needs (03/21/2023)   PRAPARE - Administrator, Civil Service (Medical): No    Lack of Transportation (Non-Medical): No  Physical Activity: Sufficiently Active (03/21/2023)   Exercise Vital Sign    Days of Exercise per Week: 7 days    Minutes of Exercise per Session: 30 min  Stress: No Stress Concern Present (03/21/2023)   [employer] of Occupational Health - Occupational Stress Questionnaire    Feeling of Stress : Not at all  Social Connections: Socially Integrated (03/21/2023)   Social Connection and Isolation Panel [NHANES]    Frequency of Communication with Friends and Family: More than three times a week    Frequency of Social Gatherings with Friends and Family: More than three times a week    Attends Religious Services: More than 4 times per year    Active Member of [employer] or Organizations: Yes    Attends Engineer, structural: More than 4 times per year    Marital Status: Married    Family History  Problem Relation Age of Onset   Dementia Mother    Diabetes Mother    Lymphoma Father    Cancer Father        lymphoma   Hypertension Father    Diabetes Sister    Healthy Sister    Lymphoma Brother    Cancer Brother        lymphoma   Cancer Brother        skin cancer   Diabetes Brother    Diabetes Brother    Kidney disease Brother    COPD Brother    Prostate cancer Brother 42   Lymphoma Paternal Grandfather    Cancer Paternal Grandfather        lymphoma   Heart disease Neg Hx     Outpatient Encounter Medications as of 05/19/2023  Medication Sig   Accu-Chek Softclix Lancets lancets Use as instructed to monitor glucose twice daily   amLODipine (NORVASC) 10 MG tablet TAKE 1 TABLET(10 MG) BY MOUTH DAILY   aspirin EC 81 MG tablet Take 81 mg by mouth  daily. Swallow whole.   clotrimazole (LOTRIMIN) 1 % cream Apply 1 application  topically 2 (two) times daily as needed (jock itch).   furosemide (LASIX) 20 MG tablet Take 20 mg by mouth.   glucose blood (ACCU-CHEK GUIDE TEST) test strip Use as instructed to monitor glucose twice daily   insulin degludec (TRESIBA FLEXTOUCH) 100 UNIT/ML FlexTouch Pen Inject 24 Units into the skin at bedtime. (Patient taking differently: Inject 25 Units into the skin at bedtime.)   Insulin Pen Needle (BD PEN NEEDLE NANO 2ND GEN) 32G X 4 MM MISC USE AS NEEDED AS DIRECTED   Multiple Vitamins-Minerals (MULTIVITAMIN WITH MINERALS) tablet Take 1 tablet by mouth daily.   Omega-3 Fatty Acids (FISH OIL PO) Take 1,400 mg by mouth daily.   omeprazole (PRILOSEC) 40 MG capsule Take 1 capsule (40 mg total) by mouth daily.   oxybutynin (DITROPAN) 5 MG tablet Take 5 mg by mouth daily.   simvastatin (ZOCOR) 20 MG tablet Take 1 tablet (20 mg total) by mouth daily.   tamsulosin (FLOMAX) 0.4 MG CAPS capsule Take 1 capsule (0.4 mg total) by mouth 2 (two) times daily after a meal. For urinary urgency or weak stream. (Patient taking differently: Take 0.4 mg by mouth daily. For urinary urgency or weak stream.)   valACYclovir (VALTREX) 1000 MG tablet TAKE 2 TABLETS BY MOUTH AT ONSET OF COLD SORE. REPEAT ONCE IN 12 HOURS (4 TABS/COURSE)   [DISCONTINUED] saxagliptin HCl (ONGLYZA) 2.5 MG TABS tablet TAKE 1 TABLET(2.5 MG) BY MOUTH DAILY   saxagliptin HCl (ONGLYZA) 2.5 MG TABS tablet Take 1 tablet (2.5 mg total) by mouth daily.   No facility-administered encounter medications on file as of 05/19/2023.    ALLERGIES: Allergies  Allergen Reactions   Ace Inhibitors Cough   Statins Cough    VACCINATION STATUS: Immunization History  Administered Date(s) Administered   DTaP 07/25/2008   Fluad Quad(high Dose 65+) 02/21/2020   Influenza Split 11/17/2011, 12/30/2013, 12/26/2014   Influenza, High Dose Seasonal PF 01/30/2013, 11/12/2015,  12/08/2016, 01/16/2018, 12/25/2018, 02/10/2021   Influenza-Unspecified 12/16/2016, 02/12/2021, 11/29/2021, 11/16/2022   Moderna SARS-COV2 Booster Vaccination 02/21/2020, 08/21/2020   Moderna Sars-Covid-2 Vaccination 05/15/2019, 06/12/2019, 02/21/2020, 08/21/2020   PNEUMOCOCCAL CONJUGATE-20 03/21/2023   Pneumococcal Conjugate-13 03/04/2014   Pneumococcal Polysaccharide-23 11/29/2009, 04/09/2015   Tdap 07/25/2008, 05/04/2021   Zoster Recombinant(Shingrix) 05/22/2021, 07/06/2021   Zoster, Live 09/05/2013    Diabetes He presents for his follow-up diabetic visit. He has type 2 diabetes mellitus. Onset time: Diagnosed at approx age of 82. His disease course has been stable. There are no hypoglycemic associated symptoms. (Yeast infection in groin) There are no hypoglycemic complications. Symptoms are stable. Diabetic complications include nephropathy. Risk factors for coronary artery disease include diabetes mellitus, male sex and hypertension. Current diabetic treatment includes insulin injections and oral agent (dual therapy). He is compliant with treatment most of the time. His weight is fluctuating minimally. He is following a generally healthy diet. Meal planning includes avoidance of concentrated sweets and ADA exchanges. He has not had a previous visit with a dietitian. He participates in exercise daily. His home blood glucose trend is fluctuating minimally. His breakfast blood glucose range is generally 90-110 mg/dl. (He presents today with his meter and logs showing at goal fasting glycemic profile.  His POCT A1c today is 7.3%, improving from last visit of 7.8%.  He denies any significant hypoglycemia, lowest reading noted was 65.  He notes he is still active, walking every day.) An ACE inhibitor/angiotensin II receptor blocker is  not being taken. He sees a podiatrist.Eye exam is current.    Review of systems  Constitutional: + Minimally fluctuating body weight,  current Body mass index is 30.59  kg/m. , no fatigue, no subjective hyperthermia, no subjective hypothermia Eyes: no blurry vision, no xerophthalmia ENT: no sore throat, no nodules palpated in throat, no dysphagia/odynophagia, no hoarseness Cardiovascular: no chest pain, no shortness of breath, no palpitations, no leg swelling Respiratory: no cough, no shortness of breath Gastrointestinal: no nausea/vomiting/diarrhea Musculoskeletal: no muscle/joint aches Skin: no rashes, no hyperemia Neurological: no tremors, no numbness, no tingling, no dizziness Psychiatric: no depression, no anxiety  Objective:     BP 124/86 (BP Location: Right Arm, Patient Position: Sitting, Cuff Size: Large)   Ht 6' 3.5" (1.918 m)   Wt 248 lb (112.5 kg)   BMI 30.59 kg/m   Wt Readings from Last 3 Encounters:  05/19/23 248 lb (112.5 kg)  03/21/23 250 lb 6.4 oz (113.6 kg)  01/19/23 242 lb 12.8 oz (110.1 kg)     BP Readings from Last 3 Encounters:  05/19/23 124/86  03/21/23 128/68  01/19/23 (!) 140/74     Physical Exam- Limited  Constitutional:  Body mass index is 30.59 kg/m. , not in acute distress, normal state of mind Eyes:  EOMI, no exophthalmos Musculoskeletal: no gross deformities, strength intact in all four extremities, no gross restriction of joint movements Skin:  no rashes, no hyperemia Neurological: no tremor with outstretched hands   Diabetic Foot Exam - Simple   No data filed      CMP ( most recent) CMP     Component Value Date/Time   NA 139 09/03/2021 0828   K 4.6 09/03/2021 0828   CL 99 09/03/2021 0828   CO2 24 09/03/2021 0828   GLUCOSE 94 09/03/2021 0828   GLUCOSE 136 (H) 01/12/2021 1522   BUN 29 (H) 09/03/2021 0828   CREATININE 1.7 (A) 10/26/2022 0000   CREATININE 1.85 (H) 09/03/2021 0828   CREATININE 1.41 (H) 11/11/2020 1022   CALCIUM 9.2 09/03/2021 0828   PROT 7.0 09/03/2021 0828   ALBUMIN 4.1 09/03/2021 0828   AST 15 09/03/2021 0828   ALT 15 09/03/2021 0828   ALKPHOS 129 (H) 09/03/2021 0828    BILITOT 0.5 09/03/2021 0828   GFRNONAA 44 (L) 01/12/2021 1522   GFRAA 47 (L) 08/13/2019 0835     Diabetic Labs (most recent): Lab Results  Component Value Date   HGBA1C 7.3 (A) 05/19/2023   HGBA1C 7.8 (A) 01/19/2023   HGBA1C 6.7 (A) 09/23/2022   MICROALBUR 14 05/26/2021   MICROALBUR 8.8 02/10/2021   MICROALBUR <4 02/13/2020     Lipid Panel ( most recent) Lipid Panel     Component Value Date/Time   CHOL 102 09/20/2022 1645   TRIG 66 09/20/2022 1645   HDL 39 (L) 09/20/2022 1645   CHOLHDL 2.6 09/20/2022 1645   CHOLHDL 2.4 02/02/2017 1205   VLDL 12 06/23/2016 1032   LDLCALC 49 09/20/2022 1645   LDLCALC 71 02/02/2017 1205   LABVLDL 14 09/20/2022 1645      Lab Results  Component Value Date   TSH 2.060 09/03/2021   TSH 3.010 08/19/2020   TSH 3.000 02/05/2019   TSH 2.580 08/31/2017   TSH 2.21 06/23/2016   TSH 1.99 04/09/2015   TSH 2.093 02/25/2014   TSH 2.021 01/30/2013   TSH 2.113 12/01/2011   TSH 1.681 08/26/2010           Assessment & Plan:   1)  Type 2 diabetes mellitus with stage 3b chronic kidney disease, with long-term current use of insulin (HCC)  He presents today with his meter and logs showing at goal fasting glycemic profile.  His POCT A1c today is 7.3%, improving from last visit of 7.8%.  He denies any significant hypoglycemia, lowest reading noted was 65.  He notes he is still active, walking every day.  - James Glover has currently uncontrolled symptomatic type 2 DM since 78 years of age.   -Recent labs reviewed.  - I had a long discussion with him about the progressive nature of diabetes and the pathology behind its complications. -his diabetes is complicated by CKD stage 3b (sees nephrology) and he remains at a high risk for more acute and chronic complications which include CAD, CVA, CKD, retinopathy, and neuropathy. These are all discussed in detail with him.  The following Lifestyle Medicine recommendations according to American College of  Lifestyle Medicine [facility]) were discussed and offered to patient and he agrees to start the journey:  A. Whole Foods, Plant-based plate comprising of fruits and vegetables, plant-based proteins, whole-grain carbohydrates was discussed in detail with the patient.   A list for source of those nutrients were also provided to the patient.  Patient will use only water or unsweetened tea for hydration. B.  The need to stay away from risky substances including alcohol, smoking; obtaining 7 to 9 hours of restorative sleep, at least 150 minutes of moderate intensity exercise weekly, the importance of healthy social connections,  and stress reduction techniques were discussed. C.  A full color page of  Calorie density of various food groups per pound showing examples of each food groups was provided to the patient.  - Nutritional counseling repeated at each appointment due to patients tendency to fall back in to old habits.  - The patient admits there is a room for improvement in their diet and drink choices. -  Suggestion is made for the patient to avoid simple carbohydrates from their diet including Cakes, Sweet Desserts / Pastries, Ice Cream, Soda (diet and regular), Sweet Tea, Candies, Chips, Cookies, Sweet Pastries, Store Bought Juices, Alcohol in Excess of 1-2 drinks a day, Artificial Sweeteners, Coffee Creamer, and "Sugar-free" Products. This will help patient to have stable blood glucose profile and potentially avoid unintended weight gain.   - I encouraged the patient to switch to unprocessed or minimally processed complex starch and increased protein intake (animal or plant source), fruits, and vegetables.   - Patient is advised to stick to a routine mealtimes to eat 3 meals a day and avoid unnecessary snacks (to snack only to correct hypoglycemia).  - I have approached him with the following individualized plan to manage his diabetes and patient agrees:   -He is advised to continue his Tresiba 24  units SQ nightly and continue his Onglyza 2.5 mg daily.  -he is encouraged to continue monitoring glucose at least once daily, before breakfast, and to call the clinic if he has readings less than 70 or above 300 for 3 tests in a row.   - he is warned not to take insulin without proper monitoring per orders. - Adjustment parameters are given to him for hypo and hyperglycemia in writing.  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 - he is not a candidate for Metformin due to concurrent renal insufficiency.  - Specific targets for  A1c; LDL, HDL,  and Triglycerides were discussed with the patient.  2) Blood Pressure /Hypertension:  his blood pressure is controlled to target.   he is advised to continue his current medications including Norvasc 10 mg p.o. daily with breakfast.  3) Lipids/Hyperlipidemia:    Review of his recent lipid panel from 09/20/22 showed controlled LDL at 49 .  he is advised to continue Simvastatin 20 mg daily at bedtime.  Side effects and precautions discussed with him.  4)  Weight/Diet:  his Body mass index is 30.59 kg/m.  -  clearly complicating his diabetes care.   he is a candidate for weight loss. I discussed with him the fact that loss of 5 - 10% of his  current body weight will have the most impact on his diabetes management.  Exercise, and detailed carbohydrates information provided  -  detailed on discharge instructions.  5) Chronic Care/Health Maintenance: -he is not on ACEI/ARB and is on Statin medications and is encouraged to initiate and continue to follow up with Ophthalmology, Dentist, Podiatrist at least yearly or according to recommendations, and advised to stay away from smoking. I have recommended yearly flu vaccine and pneumonia vaccine at least every 5 years; moderate intensity exercise for up to 150 minutes weekly; and sleep for at least 7 hours  a day.  - he is advised to maintain close follow up with Joselyn Arrow, MD for primary care needs, as well as his other providers for optimal and coordinated care.     I spent  20  minutes in the care of the patient today including review of labs from CMP, Lipids, Thyroid Function, Hematology (current and previous including abstractions from other facilities); face-to-face time discussing  his blood glucose readings/logs, discussing hypoglycemia and hyperglycemia episodes and symptoms, medications doses, his options of short and long term treatment based on the latest standards of care / guidelines;  discussion about incorporating lifestyle medicine;  and documenting the encounter. Risk reduction counseling performed per USPSTF guidelines to reduce obesity and cardiovascular risk factors.     Please refer to Patient Instructions for Blood Glucose Monitoring and Insulin/Medications Dosing Guide"  in media tab for additional information. Please  also refer to " Patient Self Inventory" in the Media  tab for reviewed elements of pertinent patient history.  James Glover participated in the discussions, expressed understanding, and voiced agreement with the above plans.  All questions were answered to his satisfaction. he is encouraged to contact clinic should he have any questions or concerns prior to his return visit.     Follow up plan: - Return in about 6 months (around 11/19/2023) for Diabetes F/U with A1c in office, No previsit labs, Bring meter and logs.  Ronny Bacon, [facility] [address] [place], [place] [address] Phone: [phone] Fax: [phone]  05/19/2023, 1:49 PM

## 2023-05-30 DIAGNOSIS — L720 Epidermal cyst: Secondary | ICD-10-CM | POA: Diagnosis not present

## 2023-05-30 DIAGNOSIS — C44329 Squamous cell carcinoma of skin of other parts of face: Secondary | ICD-10-CM | POA: Diagnosis not present

## 2023-06-06 DIAGNOSIS — L570 Actinic keratosis: Secondary | ICD-10-CM | POA: Diagnosis not present

## 2023-06-06 DIAGNOSIS — Z4802 Encounter for removal of sutures: Secondary | ICD-10-CM | POA: Diagnosis not present

## 2023-06-20 DIAGNOSIS — E669 Obesity, unspecified: Secondary | ICD-10-CM | POA: Diagnosis not present

## 2023-06-20 DIAGNOSIS — E1122 Type 2 diabetes mellitus with diabetic chronic kidney disease: Secondary | ICD-10-CM | POA: Diagnosis not present

## 2023-06-20 DIAGNOSIS — N1831 Chronic kidney disease, stage 3a: Secondary | ICD-10-CM | POA: Diagnosis not present

## 2023-06-20 DIAGNOSIS — N179 Acute kidney failure, unspecified: Secondary | ICD-10-CM | POA: Diagnosis not present

## 2023-06-20 DIAGNOSIS — E785 Hyperlipidemia, unspecified: Secondary | ICD-10-CM | POA: Diagnosis not present

## 2023-06-20 DIAGNOSIS — I129 Hypertensive chronic kidney disease with stage 1 through stage 4 chronic kidney disease, or unspecified chronic kidney disease: Secondary | ICD-10-CM | POA: Diagnosis not present

## 2023-06-20 DIAGNOSIS — N2581 Secondary hyperparathyroidism of renal origin: Secondary | ICD-10-CM | POA: Diagnosis not present

## 2023-06-20 LAB — COMPREHENSIVE METABOLIC PANEL WITH GFR: eGFR: 37

## 2023-06-20 LAB — BASIC METABOLIC PANEL WITH GFR: Creatinine: 1.8 — AB (ref 0.6–1.3)

## 2023-06-20 LAB — MICROALBUMIN / CREATININE URINE RATIO: Microalb Creat Ratio: <7

## 2023-06-20 LAB — PROTEIN / CREATININE RATIO, URINE
Albumin, U: <3
Creatinine, Urine: 45.3

## 2023-06-30 ENCOUNTER — Encounter: Payer: Self-pay | Admitting: *Deleted

## 2023-07-04 ENCOUNTER — Encounter: Payer: Self-pay | Admitting: Podiatry

## 2023-07-04 ENCOUNTER — Ambulatory Visit: Payer: Medicare PPO | Admitting: Podiatry

## 2023-07-04 DIAGNOSIS — K439 Ventral hernia without obstruction or gangrene: Secondary | ICD-10-CM | POA: Insufficient documentation

## 2023-07-04 DIAGNOSIS — E1122 Type 2 diabetes mellitus with diabetic chronic kidney disease: Secondary | ICD-10-CM | POA: Diagnosis not present

## 2023-07-04 DIAGNOSIS — B351 Tinea unguium: Secondary | ICD-10-CM | POA: Diagnosis not present

## 2023-07-04 DIAGNOSIS — M79674 Pain in right toe(s): Secondary | ICD-10-CM

## 2023-07-04 DIAGNOSIS — N183 Chronic kidney disease, stage 3 unspecified: Secondary | ICD-10-CM | POA: Diagnosis not present

## 2023-07-04 DIAGNOSIS — Z1211 Encounter for screening for malignant neoplasm of colon: Secondary | ICD-10-CM | POA: Insufficient documentation

## 2023-07-04 DIAGNOSIS — M79675 Pain in left toe(s): Secondary | ICD-10-CM | POA: Diagnosis not present

## 2023-07-04 DIAGNOSIS — Z794 Long term (current) use of insulin: Secondary | ICD-10-CM | POA: Diagnosis not present

## 2023-07-04 DIAGNOSIS — K5730 Diverticulosis of large intestine without perforation or abscess without bleeding: Secondary | ICD-10-CM | POA: Insufficient documentation

## 2023-07-04 DIAGNOSIS — R059 Cough, unspecified: Secondary | ICD-10-CM | POA: Insufficient documentation

## 2023-07-04 DIAGNOSIS — K5900 Constipation, unspecified: Secondary | ICD-10-CM | POA: Insufficient documentation

## 2023-07-04 NOTE — Progress Notes (Signed)
  Subjective:  Patient ID: James Glover, male    DOB: [birth year],   MRN: [id number]  Chief Complaint  Patient presents with   Debridement    Trim toenails-diabetic - 7.8    77 y.o. male presents for concern of thickened elongated and painful nails that are difficult to trim. Requesting to have them trimmed today. Denies any burning or tingling in her feet. Patient is diabetic and last A1c was 7.5  . Denies any other pedal complaints. Denies n/v/f/c.   PCP: Roosvelt Colla MD   Past Medical History:  Diagnosis Date   CKD (chronic kidney disease), stage III [facility])    nephrologist-- coladonato--- per lov note 03-08-2018 in epic, stable (baseline Cr 1.5 - 2)   Dyslipidemia    Erectile dysfunction    First degree heart block    GERD (gastroesophageal reflux disease)    Hemorrhoids    internal and external   History of nuclear stress test    05-26-2009 (by dr Ed Gondola due to HTN and DM2)--- normal study w/ no ischemia,  normal LV function and wall motion , ef 70%   Hyperplasia of prostate with lower urinary tract symptoms (LUTS)    Hypertension    Hypogonadism male    Iron deficiency    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 Secundino Dach   Prostate cancer [facility]) urologist-  dr t. Secundino Dach  oncologsit-  dr Lorri Rota   dx 05-30-2018-- Stage T1c, Gleason 4+5, High Risk   Renal cyst, left    followed by dr t. Secundino Dach--  chronic noncomplex   Skin cancer 11/2021   basal cell, R ear   Type 2 diabetes mellitus (HCC)    followed by pcp   Wears partial dentures    upper    Objective:  Physical Exam: Vascular: DP/PT pulses 2/4 bilateral. CFT <3 seconds. Normal hair growth on digits. No edema.  Skin. No lacerations or abrasions bilateral feet. Nails 1-5 are thickened discolored and elongated with subungual debris.  Musculoskeletal: MMT 5/5 bilateral lower extremities in DF, PF, Inversion and Eversion. Deceased ROM in DF of ankle joint.  Neurological: Sensation intact to  light touch.   Assessment:   1. Pain due to onychomycosis of toenails of both feet   2. Controlled type 2 diabetes mellitus with stage 3 chronic kidney disease, with long-term current use of insulin  (HCC)        Plan:  Patient was evaluated and treated and all questions answered. -Discussed and educated patient on diabetic foot care, especially with  regards to the vascular, neurological and musculoskeletal systems.  -Stressed the importance of good glycemic control and the detriment of not  controlling glucose levels in relation to the foot. -Discussed supportive shoes at all times and checking feet regularly.  -Mechanically debrided all nails 1-5 bilateral using sterile nail nipper and filed with dremel without incident  -Answered all patient questions -Patient to return  in 3 months for at risk foot care -Patient advised to call the office if any problems or questions arise in the meantime.   Jennefer Moats, DPM

## 2023-07-06 ENCOUNTER — Other Ambulatory Visit: Payer: Self-pay | Admitting: Nurse Practitioner

## 2023-07-06 DIAGNOSIS — E1122 Type 2 diabetes mellitus with diabetic chronic kidney disease: Secondary | ICD-10-CM

## 2023-07-13 ENCOUNTER — Other Ambulatory Visit: Payer: Self-pay | Admitting: Nurse Practitioner

## 2023-07-13 DIAGNOSIS — E1122 Type 2 diabetes mellitus with diabetic chronic kidney disease: Secondary | ICD-10-CM

## 2023-07-27 ENCOUNTER — Other Ambulatory Visit: Payer: Self-pay | Admitting: Family Medicine

## 2023-07-27 DIAGNOSIS — I10 Essential (primary) hypertension: Secondary | ICD-10-CM

## 2023-08-02 DIAGNOSIS — L578 Other skin changes due to chronic exposure to nonionizing radiation: Secondary | ICD-10-CM | POA: Diagnosis not present

## 2023-08-02 DIAGNOSIS — D225 Melanocytic nevi of trunk: Secondary | ICD-10-CM | POA: Diagnosis not present

## 2023-08-02 DIAGNOSIS — Z85828 Personal history of other malignant neoplasm of skin: Secondary | ICD-10-CM | POA: Diagnosis not present

## 2023-08-02 DIAGNOSIS — L814 Other melanin hyperpigmentation: Secondary | ICD-10-CM | POA: Diagnosis not present

## 2023-08-02 DIAGNOSIS — L821 Other seborrheic keratosis: Secondary | ICD-10-CM | POA: Diagnosis not present

## 2023-08-02 DIAGNOSIS — L570 Actinic keratosis: Secondary | ICD-10-CM | POA: Diagnosis not present

## 2023-08-03 DIAGNOSIS — Z1211 Encounter for screening for malignant neoplasm of colon: Secondary | ICD-10-CM | POA: Diagnosis not present

## 2023-08-10 LAB — COLOGUARD: COLOGUARD: NEGATIVE

## 2023-08-11 ENCOUNTER — Ambulatory Visit: Payer: Self-pay | Admitting: Family Medicine

## 2023-09-15 ENCOUNTER — Other Ambulatory Visit: Payer: Self-pay | Admitting: Family Medicine

## 2023-09-15 DIAGNOSIS — K219 Gastro-esophageal reflux disease without esophagitis: Secondary | ICD-10-CM

## 2023-09-15 DIAGNOSIS — E785 Hyperlipidemia, unspecified: Secondary | ICD-10-CM

## 2023-09-18 NOTE — Progress Notes (Unsigned)
 No chief complaint on file.  Patient presents for 6 month f/u on chronic problems.   DM--He is now under the care of endocrinologist. He was last seen in March. A1c had improved from 7.8% in November, to 7.3% in March. He was advised to continue Tresiba  24 U nightly, and Onglyza  2.5 mg daily.  He has f/u scheduled in September.  A1c was noted to be back up to 7.8 in 05/2023, done by nephro.  Sugars are running ***    He denies hypoglycemia, polydipsia, polyuria. He sees the podiatrist for routine foot care, denies foot concerns. Last diabetic eye exam was 04/2023, no retinopathy.    Hypertension and CKD stage 3, under the care of nephro.  Last labs showed Cr 1.8 in April 2025. He is compliant with amlodipine  and furosemide , and denies side effects. He tries to follow a low sodium diet. Doesn't add salt, doesn't eat canned foods. +sausage and lunchmeats. ***UPDATE   He denies headaches, dizziness, chest pain, shortness of breath. He denies any edema.  BP Readings from Last 3 Encounters:  05/19/23 124/86  03/21/23 128/68  01/19/23 (!) 140/74     GERD:  He continues on PPI at night (previously took BID). He continues to do well at this dose, without any heartburn, throat-clearing.  He denies dysphagia. He does report some cough at night, thinks related to sinuses draining. But he doesn't notice any nasal drainage. Cough isn't bothersome to him. ***UPDATE    Hyperlipidemia: he is compliant with taking simvastatin , denies side effects. Lipids were at goal on last check in 08/2022.  Due for recheck.  Aortic atherosclerosis was incidentally noted on abdominal CT 09/2020.  Component Ref Range & Units (hover) 12 mo ago (09/20/22) 2 yr ago (09/03/21) 3 yr ago (08/19/20) 4 yr ago (08/13/19) 5 yr ago (08/14/18) 5 yr ago (02/02/18) 6 yr ago (08/31/17)  Cholesterol, Total 102 156 104 133 142 168 138  Triglycerides 66 83 77 78 48 60 58  HDL 39 Low  50 39 Low  42 53 58 42  VLDL Cholesterol Cal  14 16 16 15 10 12 12   LDL Chol Calc (NIH) 49 90 49 76     Chol/HDL Ratio 2.6 3.1 CM 2.7 CM 3.2 CM 2.7 CM 2.9 CM 3.3 CM     Prostate cancer: diagnosed in 04/2018.  He is s/p radiation treatments. He was treated with q 6 month Lupron injections for 2 years, stopped in 07/2020.  Last PSA was <0.01 in 07/2022. Last seen by urologist in 07/2022, and had renal US --stable small R renal mass, and cysts.  Plan for yearly PSA through 2030, recheck renal US  1 year.  ***scheduled???   PMH, PSH, SH reviewed    ROS:  no fever, chills, headaches, dizziness, chest pain, shortness of breath, edema. Denies depression, nausea, vomiting, bowel changes, vision changes, bleeding, bruising.   He gets R knee pain after walking 1/2 mile; rests and can walk further (knee that had surgery). Fungal rashes in groin?? ***  No significant weight changes     PHYSICAL EXAM:  There were no vitals taken for this visit.  Wt Readings from Last 3 Encounters:  05/19/23 248 lb (112.5 kg)  03/21/23 250 lb 6.4 oz (113.6 kg)  01/19/23 242 lb 12.8 oz (110.1 kg)   Well-appearing, pleasant male, in no distress.  Voice is slightly hoarse, chronic/unchanged.  Occasional throat-clearing/cough. HEENT: conjunctiva and sclera are clear, EOMI. OP clear. Sinuses nontender.  Neck: no lymphadenopathy or mass,  no bruit. Heart: bradycardic, with frequent ectopy/extra beats Lungs: clear, no wheezes, rales, ronchi Back: no CVA tenderness or spinal tenderness Abdomen: soft, nontender, no mass Extremities: no edema Neuro: alert and oriented, normal gait Psych: normal mood, affect, normal eye contact, grooming. Skin: no rashes, normal turgor.  Purpura R forearm.  Dry legs.   ***ectopy on heart exam?? UPDATE skin --purpura, dry legs   ASSESSMENT/PLAN:   We no longer care for his DM, sees endo, with appt in Sept Chem removed--gets labs every 6 mos with [facility], results in notes Last urology visit I see was from 07/2022.   Has he been seen since? If not, is appt scheduled? Due for PSA now (through urologist)  ***RF SIMVASTATIN  WHEN LABS BACK   F/u in January for CPE/AWV as scheduled

## 2023-09-19 ENCOUNTER — Ambulatory Visit: Payer: Medicare PPO | Admitting: Family Medicine

## 2023-09-19 ENCOUNTER — Encounter: Payer: Self-pay | Admitting: Family Medicine

## 2023-09-19 VITALS — BP 132/78 | HR 68 | Ht 75.5 in | Wt 244.0 lb

## 2023-09-19 DIAGNOSIS — I700 Atherosclerosis of aorta: Secondary | ICD-10-CM

## 2023-09-19 DIAGNOSIS — Z5181 Encounter for therapeutic drug level monitoring: Secondary | ICD-10-CM

## 2023-09-19 DIAGNOSIS — K219 Gastro-esophageal reflux disease without esophagitis: Secondary | ICD-10-CM | POA: Diagnosis not present

## 2023-09-19 DIAGNOSIS — I10 Essential (primary) hypertension: Secondary | ICD-10-CM | POA: Diagnosis not present

## 2023-09-19 DIAGNOSIS — N529 Male erectile dysfunction, unspecified: Secondary | ICD-10-CM

## 2023-09-19 DIAGNOSIS — N183 Chronic kidney disease, stage 3 unspecified: Secondary | ICD-10-CM

## 2023-09-19 DIAGNOSIS — Z8546 Personal history of malignant neoplasm of prostate: Secondary | ICD-10-CM | POA: Diagnosis not present

## 2023-09-19 DIAGNOSIS — E1122 Type 2 diabetes mellitus with diabetic chronic kidney disease: Secondary | ICD-10-CM | POA: Diagnosis not present

## 2023-09-19 DIAGNOSIS — N2581 Secondary hyperparathyroidism of renal origin: Secondary | ICD-10-CM

## 2023-09-19 DIAGNOSIS — Z794 Long term (current) use of insulin: Secondary | ICD-10-CM

## 2023-09-19 DIAGNOSIS — D519 Vitamin B12 deficiency anemia, unspecified: Secondary | ICD-10-CM

## 2023-09-19 DIAGNOSIS — E785 Hyperlipidemia, unspecified: Secondary | ICD-10-CM | POA: Diagnosis not present

## 2023-09-19 MED ORDER — OMEPRAZOLE 20 MG PO CPDR: 20.0000 mg | DELAYED_RELEASE_CAPSULE | Freq: Every day | ORAL | 1 refills | Status: DC

## 2023-09-19 NOTE — Patient Instructions (Signed)
 We discussed trying to cut back on the omeprazole , since you have been doing so well. We discussed options including switching the omeprazole  40 mg every OTHER day.  If you don't have any recurrent heartburn/GERD, you can back off further (every 3-4 days), and if still no issues, you can stop it completely. We also discussed the alternative, which is to lower the dose to 20 mg daily (and if your reflux isn't well controlled on this you can go back up to the 40 mg dose, contact us  for refills). We also discussed the potential (for in the future, if you're doing very on the 20 mg omeprazole  dose), potentially switching to Pepcid  (famotidine ) once or twice daily (20 mg). This medication doesn't interfere with nutrient absorption as much as the omeprazole .

## 2023-09-20 ENCOUNTER — Encounter: Payer: Self-pay | Admitting: *Deleted

## 2023-09-20 ENCOUNTER — Ambulatory Visit: Payer: Self-pay | Admitting: Family Medicine

## 2023-09-20 DIAGNOSIS — C61 Malignant neoplasm of prostate: Secondary | ICD-10-CM | POA: Diagnosis not present

## 2023-09-20 DIAGNOSIS — D4101 Neoplasm of uncertain behavior of right kidney: Secondary | ICD-10-CM | POA: Diagnosis not present

## 2023-09-20 DIAGNOSIS — N5201 Erectile dysfunction due to arterial insufficiency: Secondary | ICD-10-CM | POA: Diagnosis not present

## 2023-09-20 DIAGNOSIS — R350 Frequency of micturition: Secondary | ICD-10-CM | POA: Diagnosis not present

## 2023-09-20 DIAGNOSIS — N281 Cyst of kidney, acquired: Secondary | ICD-10-CM | POA: Diagnosis not present

## 2023-09-20 LAB — LIPID PANEL
Chol/HDL Ratio: 2.9 ratio (ref 0.0–5.0)
Cholesterol, Total: 120 mg/dL (ref 100–199)
HDL: 41 mg/dL (ref >39)
LDL Chol Calc (NIH): 62 mg/dL (ref 0–99)
Triglycerides: 84 mg/dL (ref 0–149)
VLDL Cholesterol Cal: 17 mg/dL (ref 5–40)

## 2023-09-20 MED ORDER — SIMVASTATIN 20 MG PO TABS: 20.0000 mg | ORAL_TABLET | Freq: Every day | ORAL | 3 refills | Status: AC

## 2023-09-20 NOTE — Progress Notes (Signed)
Ticket submitted

## 2023-09-26 NOTE — Progress Notes (Signed)
 Order(s) created erroneously. Erroneous order ID: [id number]  Order moved by: CHART CORRECTION ANALYST SEVEN, IDENTITY  Order move date/time: 09/26/2023 12:01 PM  Source Patient: [id number]  Source Contact: 06/30/2023  Destination Patient: [id number]  Destination Contact: 01/01/2021

## 2023-10-19 ENCOUNTER — Ambulatory Visit: Admitting: Podiatry

## 2023-10-19 ENCOUNTER — Encounter: Payer: Self-pay | Admitting: Podiatry

## 2023-10-19 DIAGNOSIS — Z794 Long term (current) use of insulin: Secondary | ICD-10-CM | POA: Diagnosis not present

## 2023-10-19 DIAGNOSIS — N183 Chronic kidney disease, stage 3 unspecified: Secondary | ICD-10-CM

## 2023-10-19 DIAGNOSIS — E1122 Type 2 diabetes mellitus with diabetic chronic kidney disease: Secondary | ICD-10-CM

## 2023-10-19 DIAGNOSIS — M79674 Pain in right toe(s): Secondary | ICD-10-CM

## 2023-10-19 DIAGNOSIS — M79675 Pain in left toe(s): Secondary | ICD-10-CM | POA: Diagnosis not present

## 2023-10-19 DIAGNOSIS — B351 Tinea unguium: Secondary | ICD-10-CM | POA: Diagnosis not present

## 2023-10-19 NOTE — Progress Notes (Signed)
  Subjective:  Patient ID: James Glover, male    DOB: Jun 14, 1945,   MRN: [id number]  Chief Complaint  Patient presents with   Diabetes    This is my normal check-up.  She trims my nails.  Saw Dr. Benton Rio - 05/19/2023; A1c - 7.3    78 y.o. male presents for concern of thickened elongated and painful nails that are difficult to trim. Requesting to have them trimmed today. Denies any burning or tingling in her feet. Patient is diabetic and last A1c was 7.5  . Denies any other pedal complaints. Denies n/v/f/c.   PCP: Annabelle Fetters MD   Past Medical History:  Diagnosis Date   CKD (chronic kidney disease), stage III [facility])    nephrologist-- [place]--- per lov note 03-08-2018 in epic, stable (baseline Cr 1.5 - 2)   Dyslipidemia    Erectile dysfunction    First degree heart block    GERD (gastroesophageal reflux disease)    Hemorrhoids    internal and external   History of nuclear stress test    05-26-2009 (by dr maye due to HTN and DM2)--- normal study w/ no ischemia,  normal LV function and wall motion , ef 70%   Hyperplasia of prostate with lower urinary tract symptoms (LUTS)    Hypertension    Hypogonadism male    Iron deficiency    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 alvaro   Prostate cancer [facility]) urologist-  dr t. annitta  oncologsit-  dr patrcia   dx 05-30-2018-- Stage T1c, Gleason 4+5, High Risk   Renal cyst, left    followed by dr t. alvaro--  chronic noncomplex   Skin cancer 11/2021   basal cell, R ear   Type 2 diabetes mellitus (HCC)    followed by pcp   Wears partial dentures    upper    Objective:  Physical Exam: Vascular: DP/PT pulses 2/4 bilateral. CFT <3 seconds. Normal hair growth on digits. No edema.  Skin. No lacerations or abrasions bilateral feet. Nails 1-5 are thickened discolored and elongated with subungual debris.  Musculoskeletal: MMT 5/5 bilateral lower extremities in DF, PF, Inversion and Eversion.  Deceased ROM in DF of ankle joint.  Neurological: Sensation intact to light touch.   Assessment:   1. Pain due to onychomycosis of toenails of both feet   2. Controlled type 2 diabetes mellitus with stage 3 chronic kidney disease, with long-term current use of insulin  (HCC)        Plan:  Patient was evaluated and treated and all questions answered. -Discussed and educated patient on diabetic foot care, especially with  regards to the vascular, neurological and musculoskeletal systems.  -Stressed the importance of good glycemic control and the detriment of not  controlling glucose levels in relation to the foot. -Discussed supportive shoes at all times and checking feet regularly.  -Mechanically debrided all nails 1-5 bilateral using sterile nail nipper and filed with dremel without incident  -Answered all patient questions -Patient to return  in 3 months for at risk foot care -Patient advised to call the office if any problems or questions arise in the meantime.   Asberry Failing, DPM

## 2023-10-20 DIAGNOSIS — L219 Seborrheic dermatitis, unspecified: Secondary | ICD-10-CM | POA: Diagnosis not present

## 2023-11-15 DIAGNOSIS — L219 Seborrheic dermatitis, unspecified: Secondary | ICD-10-CM | POA: Diagnosis not present

## 2023-11-21 ENCOUNTER — Ambulatory Visit: Admitting: Nurse Practitioner

## 2023-11-21 ENCOUNTER — Encounter: Payer: Self-pay | Admitting: Nurse Practitioner

## 2023-11-21 VITALS — BP 138/74 | HR 59 | Ht 76.0 in | Wt 244.2 lb

## 2023-11-21 DIAGNOSIS — E1122 Type 2 diabetes mellitus with diabetic chronic kidney disease: Secondary | ICD-10-CM

## 2023-11-21 DIAGNOSIS — I10 Essential (primary) hypertension: Secondary | ICD-10-CM | POA: Diagnosis not present

## 2023-11-21 DIAGNOSIS — E782 Mixed hyperlipidemia: Secondary | ICD-10-CM

## 2023-11-21 DIAGNOSIS — Z794 Long term (current) use of insulin: Secondary | ICD-10-CM

## 2023-11-21 DIAGNOSIS — N183 Chronic kidney disease, stage 3 unspecified: Secondary | ICD-10-CM

## 2023-11-21 DIAGNOSIS — Z7984 Long term (current) use of oral hypoglycemic drugs: Secondary | ICD-10-CM | POA: Diagnosis not present

## 2023-11-21 LAB — POCT GLYCOSYLATED HEMOGLOBIN (HGB A1C): Hemoglobin A1C: 7.2 % — AB (ref 4.0–5.6)

## 2023-11-21 MED ORDER — BD PEN NEEDLE NANO 2ND GEN 32G X 4 MM MISC: 6 refills | Status: DC

## 2023-11-21 MED ORDER — TRESIBA FLEXTOUCH 100 UNIT/ML ~~LOC~~ SOPN: 25.0000 [IU] | PEN_INJECTOR | Freq: Every day | SUBCUTANEOUS | 3 refills | Status: AC

## 2023-11-21 NOTE — Progress Notes (Signed)
 Endocrinology Follow Up Note       11/21/2023, 1:47 PM   Subjective:    Patient ID: James Glover, male    DOB: [birth year].  James Glover is being seen in follow up after being seen in consultation for management of currently uncontrolled symptomatic diabetes requested by  Randol Dawes, MD.   Past Medical History:  Diagnosis Date   CKD (chronic kidney disease), stage III [facility])    nephrologist-- col[place]onato--- per lov note 03-08-2018 in epic, stable (baseline Cr 1.5 - 2)   Dyslipidemia    Erectile dysfunction    First degree heart block    GERD (gastroesophageal reflux disease)    Hemorrhoids    internal and external   History of nuclear stress test    05-26-2009 (by dr maye due to HTN and DM2)--- normal study w/ no ischemia,  normal LV function and wall motion , ef 70%   Hyperplasia of prostate with lower urinary tract symptoms (LUTS)    Hypertension    Hypogonadism male    Iron deficiency    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 alvaro   Prostate cancer [facility]) urologist-  dr t. annitta  oncologsit-  dr patrcia   dx 05-30-2018-- Stage T1c, Gleason 4+5, High Risk   Renal cyst, left    followed by dr t. alvaro--  chronic noncomplex   Skin cancer 11/2021   basal cell, R ear   Type 2 diabetes mellitus (HCC)    followed by pcp   Wears partial dentures    upper    Past Surgical History:  Procedure Laterality Date   CHOLECYSTECTOMY N/A 01/14/2021   Procedure: LAPAROSCOPIC CHOLECYSTECTOMY; REMOVAL OF CHOLECYSTOSTOMY DRAIN;  Surgeon: Kallie Manuelita BROCKS, MD;  Location: [facility];  Service: General;  Laterality: N/A;   CIRCUMCISION  age 78   CYSTOSCOPY N/A 11/03/2018   Procedure: PHYLLIS SIDE;  Surgeon: alvaro Hummer, MD;  Location: [facility];  Service: Urology;  Laterality: N/A;  NO SEEDS FOUND IN BLADDER   dental implants     IR EXCHANGE BILIARY DRAIN   12/29/2020   IR FLUORO GUIDED NEEDLE PLC ASPIRATION/INJECTION LOC  10/24/2020   IR GUIDED DRAIN W CATHETER PLACEMENT  10/24/2020   IR GUIDED DRAIN W CATHETER PLACEMENT  10/24/2020   IR PERC CHOLECYSTOSTOMY  10/24/2020   IR US  GUIDE BX ASP/DRAIN  10/24/2020   IR US  GUIDE BX ASP/DRAIN  10/24/2020   IR US  GUIDE BX ASP/DRAIN  10/24/2020   KNEE ARTHROSCOPY Right 11/14/2017   dr rogers @[facility]    KNEE CARTILAGE SURGERY Right    right knee meniscus repair   RADIOACTIVE SEED IMPLANT N/A 11/03/2018   Procedure: RADIOACTIVE SEED IMPLANT/BRACHYTHERAPY IMPLANT;  Surgeon: alvaro Hummer, MD;  Location: [facility];  Service: Urology;  Laterality: N/A;       SEEDS IMPLANTED   SPACE OAR INSTILLATION N/A 11/03/2018   Procedure: SPACE OAR INSTILLATION;  Surgeon: alvaro Hummer, MD;  Location: [facility];  Service: Urology;  Laterality: N/A;    Social History   Socioeconomic History   Marital status: Married    Spouse name: Not  on file   Number of children: 0   Years of education: Not on file   Highest education level: Not on file  Occupational History   Occupation: police officer--retired    Employer: [employer]  Tobacco Use   Smoking status: Never   Smokeless tobacco: Never  Vaping Use   Vaping status: Never Used  Substance and Sexual Activity   Alcohol use: Yes    Alcohol/week: 3.0 standard drinks of alcohol    Types: 3 Glasses of wine per week    Comment: occasionally - glass of wine   Drug use: Never   Sexual activity: Not Currently    Comment: ED  Other Topics Concern   Not on file  Social History Narrative   Retired 06/2011.  Lives with wife.   Has a place on [place], spends 1-2 weeks/month there   Has 9 siblings      Updated 03/2023   Social Drivers of Health   Financial Resource Strain: Low Risk  (03/21/2023)   Overall Financial Resource Strain (CARDIA)    Difficulty of Paying Living Expenses: Not very hard  Food Insecurity: No Food  Insecurity (03/21/2023)   Hunger Vital Sign    Worried About Running Out of Food in the Last Year: Never true    Ran Out of Food in the Last Year: Never true  Transportation Needs: No Transportation Needs (03/21/2023)   PRAPARE - Administrator, Civil Service (Medical): No    Lack of Transportation (Non-Medical): No  Physical Activity: Sufficiently Active (03/21/2023)   Exercise Vital Sign    Days of Exercise per Week: 7 days    Minutes of Exercise per Session: 30 min  Stress: No Stress Concern Present (03/21/2023)   [employer] of Occupational Health - Occupational Stress Questionnaire    Feeling of Stress : Not at all  Social Connections: Socially Integrated (03/21/2023)   Social Connection and Isolation Panel    Frequency of Communication with Friends and Family: More than three times a week    Frequency of Social Gatherings with Friends and Family: More than three times a week    Attends Religious Services: More than 4 times per year    Active Member of [employer] or Organizations: Yes    Attends Engineer, structural: More than 4 times per year    Marital Status: Married    Family History  Problem Relation Age of Onset   Dementia Mother    Diabetes Mother    Lymphoma Father    Cancer Father        lymphoma   Hypertension Father    Diabetes Sister    Healthy Sister    Lymphoma Brother    Cancer Brother        lymphoma   Cancer Brother        skin cancer   Diabetes Brother    Diabetes Brother    Kidney disease Brother    COPD Brother    Prostate cancer Brother 66   Lymphoma Paternal Grandfather    Cancer Paternal Grandfather        lymphoma   Heart disease Neg Hx     Outpatient Encounter Medications as of 11/21/2023  Medication Sig   Accu-Chek Softclix Lancets lancets Use as instructed to monitor glucose twice daily   amLODipine  (NORVASC ) 10 MG tablet TAKE 1 TABLET(10 MG) BY MOUTH DAILY   aspirin  EC 81 MG tablet Take 81 mg by mouth daily.  Swallow whole.   clotrimazole (LOTRIMIN) 1 % cream Apply 1 application  topically 2 (two) times daily as needed (jock itch).   furosemide  (LASIX ) 20 MG tablet Take 20 mg by mouth.   glucose blood (ACCU-CHEK GUIDE TEST) test strip Use as instructed to monitor glucose twice daily   Multiple Vitamins-Minerals (MULTIVITAMIN WITH MINERALS) tablet Take 1 tablet by mouth daily.   Omega-3 Fatty Acids (FISH OIL PO) Take 1,400 mg by mouth daily.   omeprazole  (PRILOSEC) 20 MG capsule Take 1 capsule (20 mg total) by mouth daily before supper.   oxybutynin  (DITROPAN ) 5 MG tablet Take 5 mg by mouth daily.   saxagliptin  HCl (ONGLYZA ) 2.5 MG TABS tablet TAKE 1 TABLET(2.5 MG) BY MOUTH DAILY   simvastatin  (ZOCOR ) 20 MG tablet Take 1 tablet (20 mg total) by mouth daily.   tamsulosin  (FLOMAX ) 0.4 MG CAPS capsule Take 1 capsule (0.4 mg total) by mouth 2 (two) times daily after a meal. For urinary urgency or weak stream.   [DISCONTINUED] insulin  degludec (TRESIBA  FLEXTOUCH) 100 UNIT/ML FlexTouch Pen Inject 24 Units into the skin at bedtime. (Patient taking differently: Inject 25 Units into the skin at bedtime.)   [DISCONTINUED] Insulin  Pen Needle (BD PEN NEEDLE NANO 2ND GEN) 32G X 4 MM MISC USE AS NEEDED AS DIRECTED   insulin  degludec (TRESIBA  FLEXTOUCH) 100 UNIT/ML FlexTouch Pen Inject 25 Units into the skin at bedtime.   Insulin  Pen Needle (BD PEN NEEDLE NANO 2ND GEN) 32G X 4 MM MISC Use to inject insulin  once daily   valACYclovir  (VALTREX ) 1000 MG tablet TAKE 2 TABLETS BY MOUTH AT ONSET OF COLD SORE. REPEAT ONCE IN 12 HOURS (4 TABS/COURSE) (Patient not taking: Reported on 11/21/2023)   No facility-administered encounter medications on file as of 11/21/2023.    ALLERGIES: Allergies  Allergen Reactions   Ace Inhibitors Cough   Statins Cough    VACCINATION STATUS: Immunization History  Administered Date(s) Administered   DTaP 07/25/2008   Fluad Quad(high Dose 65+) 02/21/2020   INFLUENZA, HIGH DOSE SEASONAL  PF 01/30/2013, 11/12/2015, 12/08/2016, 01/16/2018, 12/25/2018, 02/10/2021   Influenza Split 11/17/2011, 12/30/2013, 12/26/2014   Influenza-Unspecified 12/16/2016, 02/12/2021, 11/29/2021, 11/16/2022   Moderna SARS-COV2 Booster Vaccination 02/21/2020, 08/21/2020   Moderna Sars-Covid-2 Vaccination 05/15/2019, 06/12/2019, 02/21/2020, 08/21/2020   PNEUMOCOCCAL CONJUGATE-20 03/21/2023   Pneumococcal Conjugate-13 03/04/2014   Pneumococcal Polysaccharide-23 11/29/2009, 04/09/2015   Tdap 07/25/2008, 05/04/2021   Zoster Recombinant(Shingrix) 05/22/2021, 07/06/2021   Zoster, Live 09/05/2013    Diabetes He presents for his follow-up diabetic visit. He has type 2 diabetes mellitus. Onset time: Diagnosed at approx age of 23. His disease course has been stable. There are no hypoglycemic associated symptoms. (Yeast infection in groin) There are no hypoglycemic complications. Symptoms are stable. Diabetic complications include nephropathy. Risk factors for coronary artery disease include diabetes mellitus, male sex and hypertension. Current diabetic treatment includes insulin  injections and oral agent (dual therapy). He is compliant with treatment most of the time. His weight is fluctuating minimally. He is following a generally healthy diet. Meal planning includes avoidance of concentrated sweets and ADA exchanges. He has not had a previous visit with a dietitian. He participates in exercise daily. His home blood glucose trend is fluctuating minimally. His breakfast blood glucose range is generally 90-110 mg/dl. (He presents today with his meter and logs showing at goal fasting glycemic profile.  His POCT A1c today is 7.2%, improving from last visit of 7.3%.  He denies any significant hypoglycemia, lowest reading noted was 69.  He  notes he is still active, walking every day.) An ACE inhibitor/angiotensin II receptor blocker is not being taken. He sees a podiatrist.Eye exam is current.    Review of  systems  Constitutional: + Minimally fluctuating body weight,  current Body mass index is 29.72 kg/m. , no fatigue, no subjective hyperthermia, no subjective hypothermia Eyes: no blurry vision, no xerophthalmia ENT: no sore throat, no nodules palpated in throat, no dysphagia/odynophagia, no hoarseness Cardiovascular: no chest pain, no shortness of breath, no palpitations, no leg swelling Respiratory: no cough, no shortness of breath Gastrointestinal: no nausea/vomiting/diarrhea Musculoskeletal: no muscle/joint aches Skin: no rashes, no hyperemia Neurological: no tremors, no numbness, no tingling, no dizziness Psychiatric: no depression, no anxiety  Objective:     BP 138/74 (BP Location: Left Arm, Patient Position: Sitting, Cuff Size: Large)   Pulse (!) 59   Ht 6' 4 (1.93 m)   Wt 244 lb 3.2 oz (110.8 kg)   BMI 29.72 kg/m   Wt Readings from Last 3 Encounters:  11/21/23 244 lb 3.2 oz (110.8 kg)  09/19/23 244 lb (110.7 kg)  05/19/23 248 lb (112.5 kg)     BP Readings from Last 3 Encounters:  11/21/23 138/74  09/19/23 132/78  05/19/23 124/86      Physical Exam- Limited  Constitutional:  Body mass index is 29.72 kg/m. , not in acute distress, normal state of mind Eyes:  EOMI, no exophthalmos Musculoskeletal: no gross deformities, strength intact in all four extremities, no gross restriction of joint movements Skin:  no rashes, no hyperemia Neurological: no tremor with outstretched hands   Diabetic Foot Exam - Simple   No data filed      CMP ( most recent) CMP     Component Value Date/Time   NA 139 09/03/2021 0828   K 4.6 09/03/2021 0828   CL 99 09/03/2021 0828   CO2 24 09/03/2021 0828   GLUCOSE 94 09/03/2021 0828   GLUCOSE 136 (H) 01/12/2021 1522   BUN 29 (H) 09/03/2021 0828   CREATININE 1.8 (A) 06/20/2023 0000   CREATININE 1.85 (H) 09/03/2021 0828   CREATININE 1.41 (H) 11/11/2020 1022   CALCIUM 9.2 09/03/2021 0828   PROT 7.0 09/03/2021 0828   ALBUMIN 4.1  09/03/2021 0828   AST 15 09/03/2021 0828   ALT 15 09/03/2021 0828   ALKPHOS 129 (H) 09/03/2021 0828   BILITOT 0.5 09/03/2021 0828   GFRNONAA 44 (L) 01/12/2021 1522   GFRAA 47 (L) 08/13/2019 0835     Diabetic Labs (most recent): Lab Results  Component Value Date   HGBA1C 7.2 (A) 11/21/2023   HGBA1C 7.3 (A) 05/19/2023   HGBA1C 7.8 (A) 01/19/2023   MICROALBUR 14 05/26/2021   MICROALBUR 8.8 02/10/2021   MICROALBUR <4 02/13/2020     Lipid Panel ( most recent) Lipid Panel     Component Value Date/Time   CHOL 120 09/19/2023 1506   TRIG 84 09/19/2023 1506   HDL 41 09/19/2023 1506   CHOLHDL 2.9 09/19/2023 1506   CHOLHDL 2.4 02/02/2017 1205   VLDL 12 06/23/2016 1032   LDLCALC 62 09/19/2023 1506   LDLCALC 71 02/02/2017 1205   LABVLDL 17 09/19/2023 1506      Lab Results  Component Value Date   TSH 2.060 09/03/2021   TSH 3.010 08/19/2020   TSH 3.000 02/05/2019   TSH 2.580 08/31/2017   TSH 2.21 06/23/2016   TSH 1.99 04/09/2015   TSH 2.093 02/25/2014   TSH 2.021 01/30/2013   TSH 2.113 12/01/2011  TSH 1.681 08/26/2010           Assessment & Plan:   1) Type 2 diabetes mellitus with stage 3b chronic kidney disease, with long-term current use of insulin  (HCC)  He presents today with his meter and logs showing at goal fasting glycemic profile.  His POCT A1c today is 7.2%, improving from last visit of 7.3%.  He denies any significant hypoglycemia, lowest reading noted was 69.  He notes he is still active, walking every day.  - James Glover has currently uncontrolled symptomatic type 2 DM since 78 years of age.   -Recent labs reviewed.  - I had a long discussion with him about the progressive nature of diabetes and the pathology behind its complications. -his diabetes is complicated by CKD stage 3b (sees nephrology) and he remains at a high risk for more acute and chronic complications which include CAD, CVA, CKD, retinopathy, and neuropathy. These are all discussed in  detail with him.  The following Lifestyle Medicine recommendations according to American College of Lifestyle Medicine [facility]) were discussed and offered to patient and he agrees to start the journey:  A. Whole Foods, Plant-based plate comprising of fruits and vegetables, plant-based proteins, whole-grain carbohydrates was discussed in detail with the patient.   A list for source of those nutrients were also provided to the patient.  Patient will use only water or unsweetened tea for hydration. B.  The need to stay away from risky substances including alcohol, smoking; obtaining 7 to 9 hours of restorative sleep, at least 150 minutes of moderate intensity exercise weekly, the importance of healthy social connections,  and stress reduction techniques were discussed. C.  A full color page of  Calorie density of various food groups per pound showing examples of each food groups was provided to the patient.  - Nutritional counseling repeated at each appointment due to patients tendency to fall back in to old habits.  - The patient admits there is a room for improvement in their diet and drink choices. -  Suggestion is made for the patient to avoid simple carbohydrates from their diet including Cakes, Sweet Desserts / Pastries, Ice Cream, Soda (diet and regular), Sweet Tea, Candies, Chips, Cookies, Sweet Pastries, Store Bought Juices, Alcohol in Excess of 1-2 drinks a day, Artificial Sweeteners, Coffee Creamer, and Sugar-free Products. This will help patient to have stable blood glucose profile and potentially avoid unintended weight gain.   - I encouraged the patient to switch to unprocessed or minimally processed complex starch and increased protein intake (animal or plant source), fruits, and vegetables.   - Patient is advised to stick to a routine mealtimes to eat 3 meals a day and avoid unnecessary snacks (to snack only to correct hypoglycemia).  - I have approached him with the following  individualized plan to manage his diabetes and patient agrees:   -He is advised to continue his Tresiba  25 units SQ nightly and continue his Onglyza  2.5 mg daily.  -he is encouraged to continue monitoring glucose at least once daily, before breakfast, and to call the clinic if he has readings less than 70 or above 300 for 3 tests in a row.   - he is warned not to take insulin  without proper monitoring per orders. - Adjustment parameters are given to him for hypo and hyperglycemia in writing.  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 - he is  not a candidate for Metformin  due to concurrent renal insufficiency.  - Specific targets for  A1c; LDL, HDL, and Triglycerides were discussed with the patient.  2) Blood Pressure /Hypertension:  his blood pressure is controlled to target.   he is advised to continue his current medications including Norvasc  10 mg p.o. daily with breakfast.  3) Lipids/Hyperlipidemia:    Review of his recent lipid panel from 09/19/23 showed controlled LDL at 62.  he is advised to continue Simvastatin  20 mg daily at bedtime.  Side effects and precautions discussed with him.  4)  Weight/Diet:  his Body mass index is 29.72 kg/m.  -  clearly complicating his diabetes care.   he is a candidate for weight loss. I discussed with him the fact that loss of 5 - 10% of his  current body weight will have the most impact on his diabetes management.  Exercise, and detailed carbohydrates information provided  -  detailed on discharge instructions.  5) Chronic Care/Health Maintenance: -he is not on ACEI/ARB and is on Statin medications and is encouraged to initiate and continue to follow up with Ophthalmology, Dentist, Podiatrist at least yearly or according to recommendations, and advised to stay away from smoking. I have recommended yearly flu vaccine and pneumonia vaccine at  least every 5 years; moderate intensity exercise for up to 150 minutes weekly; and sleep for at least 7 hours a day.  - he is advised to maintain close follow up with Randol Dawes, MD for primary care needs, as well as his other providers for optimal and coordinated care.     I spent  44  minutes in the care of the patient today including review of labs from CMP, Lipids, Thyroid  Function, Hematology (current and previous including abstractions from other facilities); face-to-face time discussing  his blood glucose readings/logs, discussing hypoglycemia and hyperglycemia episodes and symptoms, medications doses, his options of short and long term treatment based on the latest standards of care / guidelines;  discussion about incorporating lifestyle medicine;  and documenting the encounter. Risk reduction counseling performed per USPSTF guidelines to reduce obesity and cardiovascular risk factors.     Please refer to Patient Instructions for Blood Glucose Monitoring and Insulin /Medications Dosing Guide  in media tab for additional information. Please  also refer to  Patient Self Inventory in the Media  tab for reviewed elements of pertinent patient history.  James Glover participated in the discussions, expressed understanding, and voiced agreement with the above plans.  All questions were answered to his satisfaction. he is encouraged to contact clinic should he have any questions or concerns prior to his return visit.     Follow up plan: - Return in about 6 months (around 05/20/2024) for Diabetes F/U with A1c in office, Bring meter and logs, No previsit labs.  Benton Rio, [facility] [address] [place], [place] [address] Phone: [phone] Fax: [phone]  11/21/2023, 1:47 PM

## 2023-12-21 DIAGNOSIS — I129 Hypertensive chronic kidney disease with stage 1 through stage 4 chronic kidney disease, or unspecified chronic kidney disease: Secondary | ICD-10-CM | POA: Diagnosis not present

## 2023-12-21 DIAGNOSIS — E669 Obesity, unspecified: Secondary | ICD-10-CM | POA: Diagnosis not present

## 2023-12-21 DIAGNOSIS — N1831 Chronic kidney disease, stage 3a: Secondary | ICD-10-CM | POA: Diagnosis not present

## 2023-12-21 DIAGNOSIS — E1122 Type 2 diabetes mellitus with diabetic chronic kidney disease: Secondary | ICD-10-CM | POA: Diagnosis not present

## 2023-12-21 DIAGNOSIS — N179 Acute kidney failure, unspecified: Secondary | ICD-10-CM | POA: Diagnosis not present

## 2023-12-21 DIAGNOSIS — E785 Hyperlipidemia, unspecified: Secondary | ICD-10-CM | POA: Diagnosis not present

## 2023-12-21 DIAGNOSIS — N2581 Secondary hyperparathyroidism of renal origin: Secondary | ICD-10-CM | POA: Diagnosis not present

## 2023-12-21 LAB — COMPREHENSIVE METABOLIC PANEL WITH GFR: eGFR: 44

## 2023-12-21 LAB — PROTEIN / CREATININE RATIO, URINE
Albumin, U: 9.1
Creatinine, Urine: 73.2

## 2023-12-21 LAB — MICROALBUMIN, URINE: Microalb, Ur: 73.2

## 2023-12-21 LAB — MICROALBUMIN / CREATININE URINE RATIO: Microalb Creat Ratio: 12

## 2023-12-23 ENCOUNTER — Other Ambulatory Visit: Payer: Self-pay | Admitting: Family Medicine

## 2023-12-23 DIAGNOSIS — K219 Gastro-esophageal reflux disease without esophagitis: Secondary | ICD-10-CM

## 2024-01-16 ENCOUNTER — Other Ambulatory Visit: Payer: Self-pay | Admitting: Family Medicine

## 2024-01-16 DIAGNOSIS — I10 Essential (primary) hypertension: Secondary | ICD-10-CM

## 2024-01-25 ENCOUNTER — Other Ambulatory Visit: Payer: Self-pay | Admitting: Nurse Practitioner

## 2024-01-28 ENCOUNTER — Other Ambulatory Visit: Payer: Self-pay | Admitting: Family Medicine

## 2024-01-30 ENCOUNTER — Telehealth: Payer: Self-pay | Admitting: Podiatry

## 2024-01-30 ENCOUNTER — Ambulatory Visit: Admitting: Podiatry

## 2024-01-30 NOTE — Telephone Encounter (Signed)
 LVM to reschedule appt due to provider out of office.

## 2024-02-01 DIAGNOSIS — L821 Other seborrheic keratosis: Secondary | ICD-10-CM | POA: Diagnosis not present

## 2024-02-01 DIAGNOSIS — L570 Actinic keratosis: Secondary | ICD-10-CM | POA: Diagnosis not present

## 2024-03-07 ENCOUNTER — Ambulatory Visit: Admitting: Podiatry

## 2024-03-07 ENCOUNTER — Encounter: Payer: Self-pay | Admitting: Podiatry

## 2024-03-07 DIAGNOSIS — Z794 Long term (current) use of insulin: Secondary | ICD-10-CM

## 2024-03-07 DIAGNOSIS — B351 Tinea unguium: Secondary | ICD-10-CM | POA: Diagnosis not present

## 2024-03-07 DIAGNOSIS — N183 Chronic kidney disease, stage 3 unspecified: Secondary | ICD-10-CM

## 2024-03-07 DIAGNOSIS — M79675 Pain in left toe(s): Secondary | ICD-10-CM

## 2024-03-07 DIAGNOSIS — M79674 Pain in right toe(s): Secondary | ICD-10-CM | POA: Diagnosis not present

## 2024-03-07 DIAGNOSIS — E1122 Type 2 diabetes mellitus with diabetic chronic kidney disease: Secondary | ICD-10-CM

## 2024-03-07 LAB — HM DIABETES FOOT EXAM

## 2024-03-07 NOTE — Progress Notes (Signed)
This patient returns to my office for at risk foot care.  This patient requires this care by a professional since this patient will be at risk due to having diabetes and CKD.  This patient is unable to cut nails himself since the patient cannot reach his nails.These nails are painful walking and wearing shoes.  This patient presents for at risk foot care today. ? ?General Appearance  Alert, conversant and in no acute stress. ? ?Vascular  Dorsalis pedis and posterior tibial  pulses are palpable  bilaterally.  Capillary return is within normal limits  bilaterally. Temperature is within normal limits  bilaterally. ? ?Neurologic  Senn-Weinstein monofilament wire test within normal limits  bilaterally. Muscle power within normal limits bilaterally. ? ?Nails Thick disfigured discolored nails with subungual debris  from hallux to fifth toes bilaterally. No evidence of bacterial infection or drainage bilaterally. ? ?Orthopedic  No limitations of motion  feet .  No crepitus or effusions noted.  No bony pathology or digital deformities noted. ? ?Skin  normotropic skin with no porokeratosis noted bilaterally.  No signs of infections or ulcers noted.    ? ?Onychomycosis  Pain in right toes  Pain in left toes ? ?Consent was obtained for treatment procedures.   Mechanical debridement of nails 1-5  bilaterally performed with a nail nipper.  Filed with dremel without incident.  ? ? ?Return office visit    4 months                  Told patient to return for periodic foot care and evaluation due to potential at risk complications. ? ? ?Shantai Tiedeman DPM   ?

## 2024-03-20 ENCOUNTER — Encounter: Payer: Self-pay | Admitting: *Deleted

## 2024-03-20 NOTE — Progress Notes (Unsigned)
 No chief complaint on file.  James Glover is a 79 y.o. male who presents for annual physical exam, Medicare wellness visit (see separate note) and follow-up on chronic medical conditions.    DM--He sees endocrinologist. Last A1c was 7.2% in September 2025.  He is compliant with Tresiba  25 U at bedtime and onglyza  2.5 mg daily. Sugars are running ***   He denies hypoglycemic symptoms, polydipsia, polyuria. He sees the podiatrist for routine foot care, denies foot concerns. Last diabetic eye exam was 04/2023, no retinopathy.    Hypertension and CKD stage 3, under the care of nephro.  Last labs were in 11/2023, Cr 1.6 (eGFR 44). C-met, CBC and urine results were reviewed, no proteinuria. He is compliant with amlodipine  and furosemide , and denies side effects. He tries to follow a low sodium diet. Doesn't add salt, doesn't eat canned foods. Occ turkey sandwich for lunch, hot dog 2x/month, much less sausage.   He denies headaches, dizziness, chest pain, shortness of breath. He denies any edema.   BP Readings from Last 3 Encounters:  11/21/23 138/74  09/19/23 132/78  05/19/23 124/86     GERD:  He continues on PPI at night and denies heartburn, throat-clearing.  He denies dysphagia. Dose was lowered from 40 mg to 20 mg of omeprazole  in July.  He doesn't report any significant changes at the lower dose. *** He does report some cough at night, thinks related to sinuses draining. This is isn't nightly. Coughs just a couple of times, not bothersome, doesn't interfere with sleep.   Hyperlipidemia: he is compliant with taking simvastatin , denies side effects. Lipids were at goal on last check in 08/2023. Aortic atherosclerosis was incidentally noted on abdominal CT 09/2020.   Lab Results  Component Value Date   CHOL 120 09/19/2023   HDL 41 09/19/2023   LDLCALC 62 09/19/2023   TRIG 84 09/19/2023   CHOLHDL 2.9 09/19/2023    Prostate cancer: diagnosed in 04/2018.  He is s/p radiation  treatments. He was treated with q 6 month Lupron injections for 2 years, stopped in 07/2020.  Last PSA we have was <0.01 in 07/2022. He last saw urologist in 08/2023 (and PSA done, we don't have report). F/u renal US  showed stable small R renal mass, and cysts.  Plan for yearly PSA through 2030, recheck renal US  1 year.   B12 deficiency--level was low in 01/2019 (294). Improved when taking daily MVI.  He continues on multivitamin, no separate B12.    Lab Results  Component Value Date   VITAMINB12 986 09/03/2021     Immunization History  Administered Date(s) Administered   DTaP 07/25/2008   Fluad Quad(high Dose 65+) 02/21/2020, 12/22/2023   INFLUENZA, HIGH DOSE SEASONAL PF 01/30/2013, 11/12/2015, 12/08/2016, 01/16/2018, 12/25/2018, 02/10/2021   Influenza Split 11/17/2011, 12/30/2013, 12/26/2014   Influenza-Unspecified 12/16/2016, 02/12/2021, 11/29/2021, 11/16/2022   Moderna SARS-COV2 Booster Vaccination 02/21/2020, 08/21/2020   Moderna Sars-Covid-2 Vaccination 05/15/2019, 06/12/2019, 02/21/2020, 08/21/2020   PNEUMOCOCCAL CONJUGATE-20 03/21/2023   Pneumococcal Conjugate-13 03/04/2014   Pneumococcal Polysaccharide-23 11/29/2009, 04/09/2015   Tdap 07/25/2008, 05/04/2021   Zoster Recombinant(Shingrix) 05/22/2021, 07/06/2021   Zoster, Live 09/05/2013   Last colonoscopy: 05/05/10. Negative Cologuard in 07/2023. Last PSA:  yearly through urologist, last in 08/2023 (we don't have result). Was <0.01 in June 2024 by urologist (plan is yearly PSA through 2030) Dentist: goes 2-3 times per year.  Ophtho: yearly (last 04/2023) Exercise:   daily, at least 30 minutes (walks a mile on average, plus squats, curls with  weights).     End of Life Discussion:  Patient has living will and [facility] POA, scanned in chart.  PMH, PSH, SH and FH reviewed and updated.     ROS: The patient denies anorexia, fever, headaches, vision loss, decreased hearing, ear pain, hoarseness, chest pain, palpitations, dizziness,  syncope, dyspnea on exertion, swelling, nausea, vomiting, diarrhea, constipation, abdominal pain, melena, hematochezia, hematuria, incontinence, dysuria, numbness, tingling, weakness, tremor, depression, anxiety, abnormal bleeding/bruising, or enlarged lymph nodes.    Up 3-4 times/night to void, and every 2-3 hours during the day. He has weakened, stop/go urinary stream. This is tolerable, managed by urologist. +ED and decreased libido. (Urology addresses) Seasonal allergies, not flaring now. Denies heartburn or dysphagia. Some cough at night per HPI Denies joint pains.    PHYSICAL EXAM:  There were no vitals taken for this visit.  Wt Readings from Last 3 Encounters:  11/21/23 244 lb 3.2 oz (110.8 kg)  09/19/23 244 lb (110.7 kg)  05/19/23 248 lb (112.5 kg)   General Appearance:    Alert, cooperative, no distress, appears stated age     Head:    Normocephalic, without obvious abnormality, atraumatic     Eyes:    PERRL, conjunctiva clear, EOM's intact, fundi benign     Ears:    Normal TM's and external ear canals.   Nose:    Normal, no drainage or sinus tenderness   Throat:    Normal mucosa.   Neck:    Supple, no lymphadenopathy; thyroid : no enlargement/ tenderness/nodules; no carotid bruit or JVD     Back:    Spine nontender, no curvature, ROM normal, no CVA tenderness   Lungs:    Clear to auscultation bilaterally without wheezes, rales or ronchi; respirations unlabored     Chest Wall:    No tenderness or deformity     Heart:    Regular rhythm, S1 and S2 normal, no murmur, rub or gallop. Regular rhythm, but frequent skipped beats/pauses. ***  Breast Exam:    No nipple tenderness, discharge, no breast masses     Abdomen:    Soft, non-tender, nondistended, normoactive bowel sounds, no masses, no hepatosplenomegaly. WHSS.  Genitalia:    Deferred to urologist     Rectal:    Deferred to urologist    Extremities:    No clubbing, cyanosis or edema  Pulses:    2+ and symmetric all  extremities     Skin:    Skin is dry/flaky, especially on lower legs; no rashes or lesions on legs/feet. No rash at groin Thick, hyperkeratotic lesion lower L cheek ***  Lymph nodes:    Cervical, supraclavicular nodes normal     Neurologic:    Normal strength, sensation and gait; reflexes 2+ and symmetric throughout                        Psych:   Normal mood, affect, hygiene and grooming         ***UPDATE IF ECTOPY UPDATE SKIN   ASSESSMENT/PLAN:  Did he ever get RSV vaccine from pharmacy? COVID? Offer/decline  Recommended at least 30 minutes of aerobic activity at least 5 days/week, weight-bearing exercise at least 2x/week; proper sunscreen use reviewed; healthy diet and alcohol recommendations (less than or equal to 2 drinks/day) reviewed; regular seatbelt use; changing batteries in smoke detectors. Immunization recommendations discussed--Continue yearly flu shots. Bivalent COVID vaccine recommended (declined today).  Recommended RSV from pharmacy. Colon cancer screening is UTD  MOST form updated and reviewed.  Full Code, Full care   F/u 6 months, med check, 1 year CPE/AWV

## 2024-03-20 NOTE — Patient Instructions (Incomplete)
 " HEALTH MAINTENANCE RECOMMENDATIONS:  It is recommended that you get at least 30 minutes of aerobic exercise at least 5 days/week (for weight loss, you may need as much as 60-90 minutes). This can be any activity that gets your heart rate up. This can be divided in 10-15 minute intervals if needed, but try and build up your endurance at least once a week.  Weight bearing exercise is also recommended twice weekly.  Eat a healthy diet with lots of vegetables, fruits and fiber.  Colorful foods have a lot of vitamins (ie green vegetables, tomatoes, red peppers, etc).  Limit sweet tea, regular sodas and alcoholic beverages, all of which has a lot of calories and sugar.  Up to 2 alcoholic drinks daily may be beneficial for men (unless trying to lose weight, watch sugars).  Drink a lot of water.  Sunscreen of at least SPF 30 should be used on all sun-exposed parts of the skin when outside between the hours of 10 am and 4 pm (not just when at beach or pool, but even with exercise, golf, tennis, and yard work!)  Use a sunscreen that says broad spectrum so it covers both UVA and UVB rays, and make sure to reapply every 1-2 hours.  Remember to change the batteries in your smoke detectors when changing your clock times in the spring and fall.  Carbon monoxide detectors are recommended for your home.  Use your seat belt every time you are in a car, and please drive safely and not be distracted with cell phones and texting while driving.    James Glover , Thank you for taking time to come for your Medicare Wellness Visit. I appreciate your ongoing commitment to your health goals. Please review the following plan we discussed and let me know if I can assist you in the future.   This is a list of the screening recommended for you and due dates:  Health Maintenance  Topic Date Due   Medicare Annual Wellness Visit  03/20/2024   COVID-19 Vaccine (3 - Moderna risk series) 10/15/2024*   Eye exam for diabetics   05/17/2024   Hemoglobin A1C  05/20/2024   Kidney health urinalysis for diabetes  06/20/2024   Yearly kidney function blood test for diabetes  12/20/2024   Complete foot exam   03/07/2025   DTaP/Tdap/Td vaccine (4 - Td or Tdap) 05/05/2031   Pneumococcal Vaccine for age over 94  Completed   Flu Shot  Completed   Hepatitis C Screening  Completed   Zoster (Shingles) Vaccine  Completed   Meningitis B Vaccine  Aged Out   Colon Cancer Screening  Discontinued   Cologuard (Stool DNA test)  Discontinued  *Topic was postponed. The date shown is not the original due date.   Please get RSV vaccine from the pharmacy.  We discussed using a nasal saline spray frequently (especially during the winter, and at bedtime) to help with dry/congested nose.  At night you are likely mouth-breathing more, contributing to dry mouth.  Goal blood pressure is <130/80. If you are consistently seeing 140's at home, we should verify your blood pressure monitor to ensure that it is accurate, and mediations might need to be adjusted if they are truly that high. Continue to limit the sodium, get regular exercise, and work on weight loss.  I'd like your goal to be to lose some inches from your waist.  Do this by continue to follow a healthy diet, and limiting your portions. Eat a  high fiber diet, lots of protein. Keep to the MyPlate recommendations, do not follow the new food pyramid that recently came out.  "

## 2024-03-20 NOTE — Progress Notes (Unsigned)
 "  No chief complaint on file.    Subjective:   James Glover is a 79 y.o. male who presents for a Medicare Annual Wellness Visit.  Fall Screening Falls in the past year?: 0 Number of falls in past year: 0 Was there an injury with Fall?: 0 Fall Risk Category Calculator: 0 Patient Fall Risk Level: Low Fall Risk  Fall Risk Patient at Risk for Falls Due to: No Fall Risks Fall risk Follow up: Falls evaluation completed  Cognitive Assessment Was the patient able to repeat memory words in 3 tries?: yes Which version was used?: Version 3 : village, kitchen, baby Clock numbers correct?: yes Clock time correct (11:10)?: yes (2:00) Normal clock drawing test?: 2 How many words correct?: 3 Which version was used?: Version 3: village kitchen baby Mini-Cog Scoring: 5  Advance Directives (For Healthcare) Does Patient Have a Medical Advance Directive?: Yes Does patient want to make changes to medical advance directive?: No - Patient declined Type of Advance Directive: Living will; Healthcare Power of [place]; Out of facility DNR (pink MOST or yellow form) Copy of Healthcare Power of Attorney in Chart?: Yes - validated most recent copy scanned in chart (See row information) Copy of Living Will in Chart?: Yes - validated most recent copy scanned in chart (See row information) Out of facility DNR (pink MOST or yellow form) in Chart? (Ambulatory ONLY): Yes - validated most recent copy scanned in chart    Allergies (verified) Ace inhibitors, Acid blockers support, and Statins   Current Medications (verified) Outpatient Encounter Medications as of 03/21/2024  Medication Sig   Accu-Chek Softclix Lancets lancets Use as instructed to monitor glucose twice daily   amLODipine  (NORVASC ) 10 MG tablet TAKE 1 TABLET(10 MG) BY MOUTH DAILY   aspirin  EC 81 MG tablet Take 81 mg by mouth daily. Swallow whole.   clotrimazole (LOTRIMIN) 1 % cream Apply 1 application  topically 2 (two) times daily as  needed (jock itch).   EMBECTA PEN NEEDLE NANO 2 GEN 32G X 4 MM MISC USE AS NEEDED AS DIRECTED   furosemide  (LASIX ) 20 MG tablet Take 20 mg by mouth.   glucose blood (ACCU-CHEK GUIDE TEST) test strip USE AS INSTRUCTED TO MONITOR GLUCOSE TWICE DAILY   insulin  degludec (TRESIBA  FLEXTOUCH) 100 UNIT/ML FlexTouch Pen Inject 25 Units into the skin at bedtime.   Multiple Vitamins-Minerals (MULTIVITAMIN WITH MINERALS) tablet Take 1 tablet by mouth daily.   Omega-3 Fatty Acids (FISH OIL PO) Take 1,400 mg by mouth daily.   omeprazole  (PRILOSEC) 20 MG capsule TAKE 1 CAPSULE(20 MG) BY MOUTH DAILY BEFORE SUPPER   oxybutynin  (DITROPAN ) 5 MG tablet Take 5 mg by mouth daily.   saxagliptin  HCl (ONGLYZA ) 2.5 MG TABS tablet TAKE 1 TABLET(2.5 MG) BY MOUTH DAILY   simvastatin  (ZOCOR ) 20 MG tablet Take 1 tablet (20 mg total) by mouth daily.   tamsulosin  (FLOMAX ) 0.4 MG CAPS capsule Take 1 capsule (0.4 mg total) by mouth 2 (two) times daily after a meal. For urinary urgency or weak stream.   No facility-administered encounter medications on file as of 03/21/2024.    History: Past Medical History:  Diagnosis Date   CKD (chronic kidney disease), stage III [facility]    nephrologist-- [facility]--- per lov note 03-08-2018 in epic, stable (baseline Cr 1.5 - 2)   Dyslipidemia    Erectile dysfunction    First degree heart block    GERD (gastroesophageal reflux disease)    Hemorrhoids    internal and external  History of nuclear stress test    05-26-2009 (by dr maye due to HTN and DM2)--- normal study w/ no ischemia,  normal LV function and wall motion , ef 70%   Hyperplasia of prostate with lower urinary tract symptoms (LUTS)    Hypertension    Hypogonadism male    Iron deficiency    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 alvaro   Prostate cancer [facility]) urologist-  dr t. annitta  oncologsit-  dr patrcia   dx 05-30-2018-- Stage T1c, Gleason 4+5, High Risk   Renal cyst, left     followed by dr t. alvaro--  chronic noncomplex   Skin cancer 11/2021   basal cell, R ear   Type 2 diabetes mellitus (HCC)    followed by pcp   Wears partial dentures    upper   Past Surgical History:  Procedure Laterality Date   CHOLECYSTECTOMY N/A 01/14/2021   Procedure: LAPAROSCOPIC CHOLECYSTECTOMY; REMOVAL OF CHOLECYSTOSTOMY DRAIN;  Surgeon: Kallie Manuelita BROCKS, MD;  Location: [facility];  Service: General;  Laterality: N/A;   CIRCUMCISION  age 28   CYSTOSCOPY N/A 11/03/2018   Procedure: PHYLLIS SIDE;  Surgeon: Alvaro Hummer, MD;  Location: [facility];  Service: Urology;  Laterality: N/A;  NO SEEDS FOUND IN BLADDER   dental implants     IR EXCHANGE BILIARY DRAIN  12/29/2020   IR FLUORO GUIDED NEEDLE PLC ASPIRATION/INJECTION LOC  10/24/2020   IR GUIDED DRAIN W CATHETER PLACEMENT  10/24/2020   IR GUIDED DRAIN W CATHETER PLACEMENT  10/24/2020   IR PERC CHOLECYSTOSTOMY  10/24/2020   IR US  GUIDE BX ASP/DRAIN  10/24/2020   IR US  GUIDE BX ASP/DRAIN  10/24/2020   IR US  GUIDE BX ASP/DRAIN  10/24/2020   KNEE ARTHROSCOPY Right 11/14/2017   dr rogers @[facility]    KNEE CARTILAGE SURGERY Right    right knee meniscus repair   RADIOACTIVE SEED IMPLANT N/A 11/03/2018   Procedure: RADIOACTIVE SEED IMPLANT/BRACHYTHERAPY IMPLANT;  Surgeon: Alvaro Hummer, MD;  Location: [facility];  Service: Urology;  Laterality: N/A;       SEEDS IMPLANTED   SPACE OAR INSTILLATION N/A 11/03/2018   Procedure: SPACE OAR INSTILLATION;  Surgeon: Alvaro Hummer, MD;  Location: [facility];  Service: Urology;  Laterality: N/A;   Family History  Problem Relation Age of Onset   Dementia Mother    Diabetes Mother    Lymphoma Father    Cancer Father        lymphoma   Hypertension Father    Diabetes Sister    Healthy Sister    Lymphoma Brother    Cancer Brother        lymphoma   Cancer Brother        skin cancer   Diabetes Brother    Diabetes Brother    Kidney  disease Brother    COPD Brother    Prostate cancer Brother 53   Lymphoma Paternal Grandfather    Cancer Paternal Grandfather        lymphoma   Heart disease Neg Hx    Social History   Occupational History   Occupation: police officer--retired    Employer: [employer]  Tobacco Use   Smoking status: Never   Smokeless tobacco: Never  Vaping Use   Vaping status: Never Used  Substance and Sexual Activity   Alcohol use: Yes    Alcohol/week: 3.0 standard drinks of alcohol  Types: 3 Glasses of wine per week    Comment: occasionally - glass of wine   Drug use: Never   Sexual activity: Not Currently    Comment: ED   Tobacco Counseling Counseling given: Not Answered  SDOH Screenings   Food Insecurity: No Food Insecurity (03/21/2023)  Housing: Low Risk (03/21/2023)  Transportation Needs: No Transportation Needs (03/21/2023)  Utilities: Not At Risk (03/21/2023)  Depression (PHQ2-9): Low Risk (03/21/2023)  Financial Resource Strain: Low Risk (03/21/2023)  Physical Activity: Sufficiently Active (03/21/2023)  Social Connections: Socially Integrated (03/21/2023)  Stress: No Stress Concern Present (03/21/2023)  Tobacco Use: Low Risk (03/07/2024)   See flowsheets for full screening details  Depression Screen PHQ 2 & 9 Depression Scale- Over the past 2 weeks, how often have you been bothered by any of the following problems? Little interest or pleasure in doing things: 0 Feeling down, depressed, or hopeless (PHQ Adolescent also includes...irritable): 0 PHQ-2 Total Score: 0     Goals Addressed   None          Objective:    There were no vitals filed for this visit. There is no height or weight on file to calculate BMI.  Hearing/Vision screen No results found. Immunizations and Health Maintenance Health Maintenance  Topic Date Due   Influenza Vaccine  09/30/2023   Diabetic kidney evaluation - Urine ACR  12/20/2023   Medicare Annual Wellness (AWV)  03/20/2024   FOOT  EXAM  03/20/2024   COVID-19 Vaccine (3 - Moderna risk series) 10/15/2024 (Originally 09/18/2020)   OPHTHALMOLOGY EXAM  05/17/2024   HEMOGLOBIN A1C  05/20/2024   Diabetic kidney evaluation - eGFR measurement  12/20/2024   DTaP/Tdap/Td (4 - Td or Tdap) 05/05/2031   Pneumococcal Vaccine: 50+ Years  Completed   Hepatitis C Screening  Completed   Zoster Vaccines- Shingrix  Completed   Meningococcal B Vaccine  Aged Out   Colonoscopy  Discontinued   Fecal DNA (Cologuard)  Discontinued        Assessment/Plan:  This is a routine wellness examination for [place].  Patient Care Team: Randol Dawes, MD as PCP - General (Family Medicine) Kristie Lamprey, MD as Consulting Physician (Gastroenterology) Grayce Buddle, RN Nurse Navigator as Registered Nurse (Medical Oncology) Kallie Manuelita BROCKS, MD as Consulting Physician (General Surgery) Dr. Elspeth LITTIE Fenton DDS as Consulting Physician (Dentistry) Patrcia Sharper, MD as Consulting Physician (Ophthalmology) Rayburn Pac, MD (Inactive) as Consulting Physician (Nephrology) Tricia, Tawni LITTIE, MD as Referring Physician (Dermatology) Alvaro Ricardo KATHEE Raddle., MD as Consulting Physician (Urology) Patrcia Cough, MD as Consulting Physician (Radiation Oncology) Sharl Selinda Dover, MD as Consulting Physician (Orthopedic Surgery) Arnaldo Juliene RAMAN, MD as Attending Physician (Family Medicine) Lionell Jon DEL, [facility] (Pharmacist) Arnaldo Juliene RAMAN, MD as Attending Physician (Family Medicine) Joya Stabs, DPM as Consulting Physician (Podiatry) [place], Tawni LITTIE, MD as Referring Physician (Dermatology) Therisa Benton PARAS, NP as Nurse Practitioner (Nurse Practitioner)  I have personally reviewed and noted the following in the patients chart:   Medical and social history Use of alcohol, tobacco or illicit drugs  Current medications and supplements including opioid prescriptions. Functional ability and status Nutritional status Physical  activity Advanced directives List of other physicians Hospitalizations, surgeries, and ER visits in previous 12 months Vitals Screenings to include cognitive, depression, and falls Referrals and appointments  No orders of the defined types were placed in this encounter.  In addition, I have reviewed and discussed with patient certain preventive protocols, quality metrics, and best practice recommendations. A written personalized  care plan for preventive services as well as general preventive health recommendations were provided to patient.   Lucienne JULIANNA Fuse, RMA   03/20/2024   No follow-ups on file.  After Visit Summary: (In Person-Printed) AVS printed and given to the patient  Nurse Notes: none  "

## 2024-03-21 ENCOUNTER — Ambulatory Visit: Payer: Medicare PPO | Admitting: Family Medicine

## 2024-03-21 ENCOUNTER — Encounter: Payer: Self-pay | Admitting: Family Medicine

## 2024-03-21 VITALS — BP 150/70 | HR 56 | Ht 75.0 in | Wt 242.2 lb

## 2024-03-21 DIAGNOSIS — Z794 Long term (current) use of insulin: Secondary | ICD-10-CM | POA: Diagnosis not present

## 2024-03-21 DIAGNOSIS — K219 Gastro-esophageal reflux disease without esophagitis: Secondary | ICD-10-CM

## 2024-03-21 DIAGNOSIS — Z8546 Personal history of malignant neoplasm of prostate: Secondary | ICD-10-CM | POA: Diagnosis not present

## 2024-03-21 DIAGNOSIS — E785 Hyperlipidemia, unspecified: Secondary | ICD-10-CM | POA: Diagnosis not present

## 2024-03-21 DIAGNOSIS — I10 Essential (primary) hypertension: Secondary | ICD-10-CM | POA: Diagnosis not present

## 2024-03-21 DIAGNOSIS — Z0000 Encounter for general adult medical examination without abnormal findings: Secondary | ICD-10-CM | POA: Diagnosis not present

## 2024-03-21 DIAGNOSIS — E66811 Obesity, class 1: Secondary | ICD-10-CM | POA: Diagnosis not present

## 2024-03-21 DIAGNOSIS — Z6830 Body mass index (BMI) 30.0-30.9, adult: Secondary | ICD-10-CM | POA: Diagnosis not present

## 2024-03-21 DIAGNOSIS — I700 Atherosclerosis of aorta: Secondary | ICD-10-CM | POA: Diagnosis not present

## 2024-03-21 DIAGNOSIS — N183 Chronic kidney disease, stage 3 unspecified: Secondary | ICD-10-CM

## 2024-03-21 DIAGNOSIS — E1122 Type 2 diabetes mellitus with diabetic chronic kidney disease: Secondary | ICD-10-CM

## 2024-03-21 DIAGNOSIS — E6609 Other obesity due to excess calories: Secondary | ICD-10-CM

## 2024-03-21 DIAGNOSIS — D519 Vitamin B12 deficiency anemia, unspecified: Secondary | ICD-10-CM

## 2024-03-21 DIAGNOSIS — N2581 Secondary hyperparathyroidism of renal origin: Secondary | ICD-10-CM

## 2024-03-21 MED ORDER — OMEPRAZOLE 20 MG PO CPDR: 20.0000 mg | DELAYED_RELEASE_CAPSULE | Freq: Every day | ORAL | 3 refills | Status: AC

## 2024-03-21 MED ORDER — AMLODIPINE BESYLATE 10 MG PO TABS: ORAL_TABLET | ORAL | 3 refills | Status: AC

## 2024-03-22 ENCOUNTER — Other Ambulatory Visit (HOSPITAL_COMMUNITY): Payer: Self-pay

## 2024-03-22 ENCOUNTER — Telehealth: Payer: Self-pay | Admitting: Pharmacy Technician

## 2024-03-22 NOTE — Telephone Encounter (Signed)
 Pharmacy Patient Advocate Encounter   Received notification from [facility] KEY that prior authorization for Amlodipine  10 mg tablet is required/requested.   Insurance verification completed.   The patient is insured through [place].   Per test claim: The current 90 day co-pay is, $1.78.  No PA needed at this time. This test claim was processed through [facility]- copay amounts may vary at other pharmacies due to pharmacy/plan contracts, or as the patient moves through the different stages of their insurance plan.

## 2024-03-26 ENCOUNTER — Other Ambulatory Visit (HOSPITAL_COMMUNITY): Payer: Self-pay

## 2024-03-29 ENCOUNTER — Other Ambulatory Visit

## 2024-04-05 ENCOUNTER — Other Ambulatory Visit: Payer: Self-pay | Admitting: Nurse Practitioner

## 2024-04-05 MED ORDER — LINAGLIPTIN 5 MG PO TABS: 5.0000 mg | ORAL_TABLET | Freq: Every day | ORAL | 1 refills | Status: AC

## 2024-04-05 NOTE — Progress Notes (Signed)
 t

## 2024-05-22 ENCOUNTER — Ambulatory Visit: Admitting: Nurse Practitioner

## 2024-07-05 ENCOUNTER — Ambulatory Visit: Admitting: Podiatry

## 2024-09-19 ENCOUNTER — Ambulatory Visit: Admitting: Family Medicine

## 2025-03-25 ENCOUNTER — Ambulatory Visit: Admitting: Family Medicine
